# Patient Record
Sex: Female | Born: 1940 | Race: White | Hispanic: No | Smoking: Never smoker
Health system: Southern US, Community
[De-identification: ages and names within clinical notes are randomized; demographics above are authoritative.]

## PROBLEM LIST (undated history)

## (undated) DIAGNOSIS — K449 Diaphragmatic hernia without obstruction or gangrene: Secondary | ICD-10-CM

## (undated) DIAGNOSIS — K922 Gastrointestinal hemorrhage, unspecified: Secondary | ICD-10-CM

## (undated) DIAGNOSIS — I219 Acute myocardial infarction, unspecified: Secondary | ICD-10-CM

## (undated) DIAGNOSIS — R51 Headache: Secondary | ICD-10-CM

## (undated) DIAGNOSIS — I2510 Atherosclerotic heart disease of native coronary artery without angina pectoris: Secondary | ICD-10-CM

## (undated) DIAGNOSIS — J449 Chronic obstructive pulmonary disease, unspecified: Secondary | ICD-10-CM

## (undated) DIAGNOSIS — K219 Gastro-esophageal reflux disease without esophagitis: Secondary | ICD-10-CM

## (undated) DIAGNOSIS — J189 Pneumonia, unspecified organism: Secondary | ICD-10-CM

## (undated) DIAGNOSIS — E063 Autoimmune thyroiditis: Secondary | ICD-10-CM

## (undated) DIAGNOSIS — C50919 Malignant neoplasm of unspecified site of unspecified female breast: Secondary | ICD-10-CM

## (undated) DIAGNOSIS — M1990 Unspecified osteoarthritis, unspecified site: Secondary | ICD-10-CM

## (undated) DIAGNOSIS — Z9861 Coronary angioplasty status: Secondary | ICD-10-CM

## (undated) DIAGNOSIS — M797 Fibromyalgia: Secondary | ICD-10-CM

## (undated) DIAGNOSIS — E039 Hypothyroidism, unspecified: Secondary | ICD-10-CM

## (undated) DIAGNOSIS — R0600 Dyspnea, unspecified: Secondary | ICD-10-CM

## (undated) DIAGNOSIS — N6022 Fibroadenosis of left breast: Secondary | ICD-10-CM

## (undated) DIAGNOSIS — R519 Headache, unspecified: Secondary | ICD-10-CM

## (undated) HISTORY — DX: Unspecified osteoarthritis, unspecified site: M19.90

## (undated) HISTORY — DX: Coronary angioplasty status: Z98.61

## (undated) HISTORY — DX: Chronic obstructive pulmonary disease, unspecified: J44.9

## (undated) HISTORY — PX: ABDOMINAL HYSTERECTOMY: SHX81

## (undated) HISTORY — PX: COLONOSCOPY: SHX174

## (undated) HISTORY — PX: APPENDECTOMY: SHX54

## (undated) HISTORY — DX: Gastro-esophageal reflux disease without esophagitis: K21.9

## (undated) HISTORY — PX: BREAST BIOPSY: SHX20

## (undated) HISTORY — PX: BREAST CYST EXCISION: SHX579

## (undated) HISTORY — DX: Atherosclerotic heart disease of native coronary artery without angina pectoris: I25.10

## (undated) HISTORY — DX: Gastrointestinal hemorrhage, unspecified: K92.2

## (undated) HISTORY — DX: Diaphragmatic hernia without obstruction or gangrene: K44.9

## (undated) HISTORY — PX: BREAST EXCISIONAL BIOPSY: SUR124

## (undated) HISTORY — PX: NASAL SINUS SURGERY: SHX719

---

## 2001-01-07 ENCOUNTER — Encounter: Payer: Self-pay | Admitting: Family Medicine

## 2001-01-07 ENCOUNTER — Encounter: Admission: RE | Admit: 2001-01-07 | Discharge: 2001-01-07 | Payer: Self-pay | Admitting: Family Medicine

## 2001-01-09 ENCOUNTER — Encounter: Payer: Self-pay | Admitting: Family Medicine

## 2001-01-09 ENCOUNTER — Encounter: Admission: RE | Admit: 2001-01-09 | Discharge: 2001-01-09 | Payer: Self-pay | Admitting: Family Medicine

## 2002-03-09 ENCOUNTER — Encounter: Admission: RE | Admit: 2002-03-09 | Discharge: 2002-06-07 | Payer: Self-pay | Admitting: Internal Medicine

## 2002-09-29 ENCOUNTER — Ambulatory Visit (HOSPITAL_COMMUNITY): Admission: RE | Admit: 2002-09-29 | Discharge: 2002-09-29 | Payer: Self-pay | Admitting: Gastroenterology

## 2002-10-23 ENCOUNTER — Encounter: Payer: Self-pay | Admitting: Pulmonary Disease

## 2002-12-09 ENCOUNTER — Encounter: Payer: Self-pay | Admitting: Surgery

## 2002-12-09 ENCOUNTER — Ambulatory Visit (HOSPITAL_COMMUNITY): Admission: RE | Admit: 2002-12-09 | Discharge: 2002-12-09 | Payer: Self-pay | Admitting: Surgery

## 2003-04-01 ENCOUNTER — Encounter: Admission: RE | Admit: 2003-04-01 | Discharge: 2003-04-01 | Payer: Self-pay | Admitting: Internal Medicine

## 2003-07-20 ENCOUNTER — Ambulatory Visit (HOSPITAL_COMMUNITY): Admission: RE | Admit: 2003-07-20 | Discharge: 2003-07-20 | Payer: Self-pay | Admitting: Internal Medicine

## 2003-08-29 ENCOUNTER — Ambulatory Visit (HOSPITAL_COMMUNITY): Admission: RE | Admit: 2003-08-29 | Discharge: 2003-08-29 | Payer: Self-pay | Admitting: Internal Medicine

## 2003-09-08 ENCOUNTER — Encounter (INDEPENDENT_AMBULATORY_CARE_PROVIDER_SITE_OTHER): Payer: Self-pay | Admitting: Specialist

## 2003-09-08 HISTORY — PX: LYSIS OF ADHESION: SHX5961

## 2003-09-08 HISTORY — PX: LAPAROSCOPIC SALPINGO OOPHERECTOMY: SHX5927

## 2003-09-09 ENCOUNTER — Inpatient Hospital Stay (HOSPITAL_COMMUNITY): Admission: RE | Admit: 2003-09-09 | Discharge: 2003-09-10 | Payer: Self-pay | Admitting: Obstetrics and Gynecology

## 2003-11-29 ENCOUNTER — Ambulatory Visit (HOSPITAL_COMMUNITY): Admission: RE | Admit: 2003-11-29 | Discharge: 2003-11-29 | Payer: Self-pay | Admitting: Orthopaedic Surgery

## 2003-12-07 ENCOUNTER — Ambulatory Visit (HOSPITAL_BASED_OUTPATIENT_CLINIC_OR_DEPARTMENT_OTHER): Admission: RE | Admit: 2003-12-07 | Discharge: 2003-12-07 | Payer: Self-pay | Admitting: Orthopaedic Surgery

## 2003-12-07 HISTORY — PX: KNEE ARTHROSCOPY: SHX127

## 2004-02-01 ENCOUNTER — Ambulatory Visit: Payer: Self-pay | Admitting: Internal Medicine

## 2004-05-11 ENCOUNTER — Ambulatory Visit: Payer: Self-pay | Admitting: Pulmonary Disease

## 2004-10-20 ENCOUNTER — Ambulatory Visit (HOSPITAL_COMMUNITY): Admission: RE | Admit: 2004-10-20 | Discharge: 2004-10-20 | Payer: Self-pay | Admitting: Obstetrics and Gynecology

## 2004-10-24 ENCOUNTER — Ambulatory Visit (HOSPITAL_COMMUNITY): Admission: RE | Admit: 2004-10-24 | Discharge: 2004-10-24 | Payer: Self-pay | Admitting: Obstetrics and Gynecology

## 2005-01-18 ENCOUNTER — Ambulatory Visit: Payer: Self-pay | Admitting: Internal Medicine

## 2005-02-16 ENCOUNTER — Ambulatory Visit (HOSPITAL_COMMUNITY): Admission: RE | Admit: 2005-02-16 | Discharge: 2005-02-16 | Payer: Self-pay | Admitting: Obstetrics and Gynecology

## 2005-02-23 ENCOUNTER — Encounter: Admission: RE | Admit: 2005-02-23 | Discharge: 2005-02-23 | Payer: Self-pay | Admitting: Obstetrics and Gynecology

## 2005-03-08 ENCOUNTER — Ambulatory Visit: Payer: Self-pay | Admitting: Internal Medicine

## 2005-04-04 ENCOUNTER — Ambulatory Visit: Payer: Self-pay | Admitting: Internal Medicine

## 2005-12-05 ENCOUNTER — Ambulatory Visit (HOSPITAL_COMMUNITY): Admission: RE | Admit: 2005-12-05 | Discharge: 2005-12-05 | Payer: Self-pay | Admitting: Obstetrics and Gynecology

## 2006-01-01 ENCOUNTER — Ambulatory Visit: Payer: Self-pay | Admitting: Cardiology

## 2006-01-16 ENCOUNTER — Ambulatory Visit: Payer: Self-pay | Admitting: Cardiology

## 2006-01-29 ENCOUNTER — Ambulatory Visit: Payer: Self-pay | Admitting: Cardiology

## 2006-03-01 ENCOUNTER — Ambulatory Visit: Payer: Self-pay | Admitting: Internal Medicine

## 2006-04-30 ENCOUNTER — Ambulatory Visit: Payer: Self-pay | Admitting: Internal Medicine

## 2006-05-30 ENCOUNTER — Ambulatory Visit: Payer: Self-pay | Admitting: Internal Medicine

## 2006-06-03 ENCOUNTER — Ambulatory Visit: Payer: Self-pay | Admitting: Critical Care Medicine

## 2006-06-16 ENCOUNTER — Encounter: Admission: RE | Admit: 2006-06-16 | Discharge: 2006-06-16 | Payer: Self-pay | Admitting: Orthopedic Surgery

## 2006-08-01 ENCOUNTER — Ambulatory Visit: Payer: Self-pay | Admitting: Internal Medicine

## 2006-10-02 HISTORY — PX: RECTAL POLYPECTOMY: SHX2309

## 2006-10-07 ENCOUNTER — Ambulatory Visit: Payer: Self-pay | Admitting: Pulmonary Disease

## 2006-11-27 ENCOUNTER — Ambulatory Visit: Payer: Self-pay | Admitting: Critical Care Medicine

## 2006-12-05 ENCOUNTER — Encounter: Payer: Self-pay | Admitting: Internal Medicine

## 2006-12-05 DIAGNOSIS — J449 Chronic obstructive pulmonary disease, unspecified: Secondary | ICD-10-CM

## 2006-12-05 DIAGNOSIS — K219 Gastro-esophageal reflux disease without esophagitis: Secondary | ICD-10-CM

## 2007-01-24 ENCOUNTER — Encounter: Payer: Self-pay | Admitting: Internal Medicine

## 2007-04-18 ENCOUNTER — Encounter: Admission: RE | Admit: 2007-04-18 | Discharge: 2007-04-18 | Payer: Self-pay | Admitting: Internal Medicine

## 2007-07-17 ENCOUNTER — Encounter: Payer: Self-pay | Admitting: Internal Medicine

## 2007-08-29 ENCOUNTER — Telehealth (INDEPENDENT_AMBULATORY_CARE_PROVIDER_SITE_OTHER): Payer: Self-pay | Admitting: *Deleted

## 2007-09-03 ENCOUNTER — Encounter: Payer: Self-pay | Admitting: Internal Medicine

## 2008-01-27 ENCOUNTER — Encounter (INDEPENDENT_AMBULATORY_CARE_PROVIDER_SITE_OTHER): Payer: Self-pay | Admitting: *Deleted

## 2008-02-10 ENCOUNTER — Encounter: Payer: Self-pay | Admitting: Adult Health

## 2008-02-10 ENCOUNTER — Ambulatory Visit: Payer: Self-pay | Admitting: Critical Care Medicine

## 2008-02-23 ENCOUNTER — Telehealth: Payer: Self-pay | Admitting: Critical Care Medicine

## 2008-03-02 ENCOUNTER — Ambulatory Visit: Payer: Self-pay | Admitting: Critical Care Medicine

## 2008-03-31 ENCOUNTER — Ambulatory Visit: Payer: Self-pay | Admitting: Internal Medicine

## 2008-04-09 ENCOUNTER — Encounter: Payer: Self-pay | Admitting: Internal Medicine

## 2008-04-15 ENCOUNTER — Ambulatory Visit: Payer: Self-pay | Admitting: Critical Care Medicine

## 2008-04-18 ENCOUNTER — Encounter: Payer: Self-pay | Admitting: Critical Care Medicine

## 2008-07-05 ENCOUNTER — Encounter: Admission: RE | Admit: 2008-07-05 | Discharge: 2008-07-05 | Payer: Self-pay | Admitting: Obstetrics and Gynecology

## 2008-11-30 ENCOUNTER — Ambulatory Visit: Payer: Self-pay | Admitting: Internal Medicine

## 2009-03-29 ENCOUNTER — Ambulatory Visit: Payer: Self-pay | Admitting: Pulmonary Disease

## 2009-03-29 DIAGNOSIS — R05 Cough: Secondary | ICD-10-CM

## 2009-04-04 ENCOUNTER — Telehealth (INDEPENDENT_AMBULATORY_CARE_PROVIDER_SITE_OTHER): Payer: Self-pay | Admitting: *Deleted

## 2009-04-13 ENCOUNTER — Ambulatory Visit: Payer: Self-pay | Admitting: Pulmonary Disease

## 2009-05-24 ENCOUNTER — Encounter: Payer: Self-pay | Admitting: Cardiology

## 2009-05-24 ENCOUNTER — Telehealth: Payer: Self-pay | Admitting: Cardiology

## 2009-06-07 DIAGNOSIS — E785 Hyperlipidemia, unspecified: Secondary | ICD-10-CM | POA: Insufficient documentation

## 2009-06-07 DIAGNOSIS — R42 Dizziness and giddiness: Secondary | ICD-10-CM | POA: Insufficient documentation

## 2009-06-09 ENCOUNTER — Ambulatory Visit: Payer: Self-pay | Admitting: Cardiology

## 2009-06-09 ENCOUNTER — Telehealth: Payer: Self-pay | Admitting: Cardiology

## 2009-08-03 ENCOUNTER — Ambulatory Visit: Payer: Self-pay | Admitting: Pulmonary Disease

## 2009-09-01 ENCOUNTER — Telehealth (INDEPENDENT_AMBULATORY_CARE_PROVIDER_SITE_OTHER): Payer: Self-pay | Admitting: *Deleted

## 2009-09-27 ENCOUNTER — Encounter
Admission: RE | Admit: 2009-09-27 | Discharge: 2009-12-08 | Payer: Self-pay | Source: Home / Self Care | Admitting: Endocrinology

## 2010-01-04 ENCOUNTER — Ambulatory Visit: Payer: Self-pay | Admitting: Pulmonary Disease

## 2010-01-04 DIAGNOSIS — G4700 Insomnia, unspecified: Secondary | ICD-10-CM

## 2010-03-23 ENCOUNTER — Telehealth: Payer: Self-pay | Admitting: Pulmonary Disease

## 2010-04-01 ENCOUNTER — Encounter: Payer: Self-pay | Admitting: Obstetrics and Gynecology

## 2010-04-02 ENCOUNTER — Encounter: Payer: Self-pay | Admitting: Obstetrics and Gynecology

## 2010-04-02 ENCOUNTER — Encounter: Payer: Self-pay | Admitting: Cardiology

## 2010-04-02 ENCOUNTER — Encounter: Payer: Self-pay | Admitting: Internal Medicine

## 2010-04-11 NOTE — Assessment & Plan Note (Signed)
Summary: np6/ veritgo/ pt has medicare, gd   Visit Type:  Initial Consult Referring Provider:  Self Primary Provider:  Dr Posey Rea   History of Present Illness: Ms Herdt comes in today self referred, after not being seen in the office in 4 years, for the acute onset of vertigo and balance gait.  This occurred suddenly 3:15 2011. It was also associated with a headache. She called here first but couldn't get in touch with with me immediately. She then went to urgent care. They advised a head CT. She refused initially and called [place] back again. We advised her to go obtain the scan.  This was done by [facility] have reviewed the report today. There were no acute findings. 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  She been having smoldering sinusitis for several weeks prior this event. She did not seek help from her primary care or from the pulmonary allergy department here that she sees on a regular basis. Advised to do so in the future.  She had no chest pain, angina, palpitations, since the tachycardia, or syncope.  Current Medications (verified): 1)  Advair Diskus 250-50 Mcg/dose Misc (Fluticasone-Salmeterol) .Marland Kitchen.. 1 Puff Two Times A Day 2)  Singulair 10 Mg Tabs (Montelukast Sodium) .... Out 3)  Nasonex 50 Mcg/act Susp (Mometasone Furoate) .... Out 4)  Proair Hfa 108 (90 Base) Mcg/act  Aers (Albuterol Sulfate) .Marland Kitchen.. 1-2 Puffs Every 4-6 Hours As Needed 5)  Synthroid 150 Mcg Tabs (Levothyroxine Sodium) .Marland Kitchen.. 1 By Mouth Daily 6)  Sinus Rinse  Pack (Hypertonic Nasal Wash) .... Daily 7)  Debrox 6.5 % Soln (Carbamide Peroxide) .... Use As Directed For Ear Wax 8)  Albuterol Sulfate (2.5 Mg/42ml) 0.083% Nebu (Albuterol Sulfate) .... One Vial Nebulized As Needed  Allergies (verified): 1)  ! Codeine Sulfate (Codeine Sulfate) 2)  ! Adenosine (Adenosine) 3)  ! * Ivp Dye  Past History:  Past Medical History: Last updated: 06/07/2009 HYPERLIPIDEMIA (ICD-272.4) CORONARY ARTERY  DISEASE, FAMILY HX (ICD-V17.3) VERTIGO (ICD-780.4) CHRONIC RHINITIS (ICD-472.0) GERD (ICD-530.81) ASTHMA (ICD-493.90) -PFT 04/15/08 FEV1 1.63(76%), FVC 3.46(117%), FEV1% 47, TLC 5.68(113%), DLCO 87%, no BD  Past Surgical History: Last updated: 03/29/2009 Appendectomy Hysterectomy Total knee replacement right Sinus surgery     -1972    Cyst off left breast  Family History: Last updated: 06/07/2009 Family History Asthma mother as child and sister, cousins etc Family History of Coronary Artery Disease:   Social History: Last updated: 03/29/2009 Divorced.  Two children (one deceased).  Retired Char[employer] fundraiser.  Never smoked, but had second hand exsposure from husband.  Alcohol use-no Drug use-no Regular exercise-no  Risk Factors: Exercise: no (03/31/2008)  Risk Factors: Smoking Status: never (03/31/2008) Passive Smoke Exposure: yes (04/15/2008)  Review of Systems       negative other than history of present illness all systems reviewed  Vital Signs:  Patient profile:   70 year old female Height:      65 inches Weight:      167 pounds BMI:     27.89 Pulse rate:   85 / minute Resp:     16 per minute BP sitting:   128 / 76  (left arm)  Vitals Entered By: Marrion Coy, CNA (June 09, 2009 10:47 AM)  Physical Exam  General:  Well developed, well nourished, in no acute distress. Head:  normocephalic and atraumatic no significant tenderness of any sinuses. Eyes:  PERRLA/EOM intact; conjunctiva and lids normal. Ears:  both external canals occluded with wax, eardrums not visible  Neck:  Neck supple, no JVD. No masses, thyromegaly or abnormal cervical nodes. Chest Jaanvi Fizer:  no deformities or breast masses noted Lungs:  Clear bilaterally to auscultation and percussion. Heart:  Non-displaced PMI, chest non-tender; regular rate and rhythm, S1, S2 without murmurs, rubs or gallops. Carotid upstroke normal, no bruit. Normal abdominal aortic size, no bruits. Femorals normal pulses, no bruits.  Pedals normal pulses. No edema, no varicosities. Msk:  Back normal, normal gait. Muscle strength and tone normal. Pulses:  pulses normal in all 4 extremities Extremities:  No clubbing or cyanosis. Neurologic:  Alert and oriented x 3. Skin:  Intact without lesions or rashes. Psych:  Normal affect.   EKG  Procedure date:  06/09/2009  Findings:      normal sinus rhythm, nonspecific T wave changes, stable  Impression & Recommendations:  Problem # 1:  VERTIGO (ICD-780.4) Assessment New Her sudden onset of vertigo and imbalance as well as a headache had been evaluated appropriately. His CT at that time did not show any acute intracranial process. She does have chronic sinusitis and based on her exam today it  probably lead to fluid accumulation in her inner ear resulting in the vertigo. There no other signs or suggestions of posterior circulation insufficiency. I do not think this is Meneire's disease.  I advised her to use ear wax softener nightly for the next week or so. Once her ears had drained, and she should followup with pulmonary and allergy. I reviewed at length that the most appropriate thing to do with acute vertigo and headache he is to get urgent evaluation. Wasting precious time calling this office or any other office could increase her risk of adverse outcome. In addition her sinusitis could exacerbate this problem again in the future. Have advised to stay closely followed by pulmonary.  Signs and symptoms of posterior circulation TIAs or mini strokes were reviewed. How to obtain urgent care for this was also reviewed and reinforced.  I'll see her back on a p.r.n. basis.  Problem # 2:  CORONARY ARTERY DISEASE, FAMILY HX (ICD-V17.3) Assessment: Unchanged  Orders: EKG w/ Interpretation (93000)  Patient Instructions: 1)  Your physician recommends that you schedule a follow-up appointment in: as needed 2)  Your physician recommends that you continue on your current  medications as directed. Please refer to the Current Medication list given to you today. 3)  follow up with dr Craige Cotta or dr Delford Field  4)  ear softner nightly  for  a week

## 2010-04-11 NOTE — Progress Notes (Signed)
Summary: Albuterol Neb refill  Phone Note Call from Patient Call back at [facility] Phone [phone]   Caller: Patient Call For: sood Summary of Call: pt requests albuterol for neb (at least 1 box). 2.5mg  1 vial as needed. [facility] on [address]. (pt has some at home to use but realized it was several yrs old).  Initial call taken by: Tivis Ringer, CNA,  April 04, 2009 11:29 AM  Follow-up for Phone Call        1 box only contains 25 vials. She uses this as needed daily and I will send 100 vials so that she can use more than once daily if needed and she will not have another copay. She normally only uses the neb if she is sick. The pt is aware. Follow-up by: Michel Bickers CMA,  April 04, 2009 11:54 AM    Prescriptions: ALBUTEROL SULFATE (2.5 MG/3ML) 0.083% NEBU (ALBUTEROL SULFATE) one vial nebulized as needed  #100 x 5   Entered by:   Michel Bickers CMA   Authorized by:   [name] MD   Signed by:   Michel Bickers CMA on 04/04/2009   Method used:   Electronically to        [facility]  [phone]* (retail)       [address]       [place], [place]  [address]       Ph: [phone] or [phone]       Fax: [phone]   RxID:   [id number]

## 2010-04-11 NOTE — Progress Notes (Signed)
Summary: prescription  Phone Note Call from Patient   Caller: Patient Call For: sood Summary of Call: pt need refill for synthoid  symbicort 160/4.5 and proair 2 to 3 puffs as needed for 90 day supply fax to [facility] at [phone] Initial call taken by: Rickard Patience,  September 01, 2009 11:30 AM  Follow-up for Phone Call        [facility] Follow-up by: Philipp Deputy CMA,  September 01, 2009 3:36 PM    New/Updated Medications: SYMBICORT 160-4.5 MCG/ACT AERO (BUDESONIDE-FORMOTEROL FUMARATE) 2 puffs twice a day Prescriptions: SYMBICORT 160-4.5 MCG/ACT AERO (BUDESONIDE-FORMOTEROL FUMARATE) 2 puffs twice a day  #3 x 3   Entered by:   Philipp Deputy CMA   Authorized by:   [name] MD   Signed by:   Philipp Deputy CMA on 09/01/2009   Method used:   Faxed to ...       [facility] ([facility]order)       [address] [place], [place]  [address]       Ph: [phone]       Fax: [phone]   RxID:   [id number] SYNTHROID 150 MCG TABS (LEVOTHYROXINE SODIUM) 1 by mouth daily  #90 x 3   Entered by:   Philipp Deputy CMA   Authorized by:   [name] MD   Signed by:   Philipp Deputy CMA on 09/01/2009   Method used:   Faxed to ...       [facility] ([facility]order)       [address] [place], [place]  [address]       Ph: [phone]       Fax: [phone]   RxID:   [id number] PROAIR HFA 108 (90 BASE) MCG/ACT  AERS (ALBUTEROL SULFATE) 1-2 puffs every 4-6 hours as needed  #3 x 3   Entered by:   Philipp Deputy CMA   Authorized by:   [name] MD   Signed by:   Philipp Deputy CMA on 09/01/2009   Method used:   Faxed to ...       [facility] ([facility])       [address] [place], [place]  [address]       Ph: [phone]       Fax: [phone]   RxID:   [id number]

## 2010-04-11 NOTE — Assessment & Plan Note (Signed)
Summary: f/u ///kp   Visit Type:  Follow-up Copy to:  Bindu Ballan Primary Provider/Referring Provider:  Tresa Garter MD  CC:  Asthma...the patient says she is doing well on Symbicort...oral  thrush has cleared.  History of Present Illness: 70 yo female with Asthma, Rhinitis.  She is using symbicort in the morning, but not always at night.  She is working full time again, and has an erratic work schedule.  She has been using 3mg  melatonin at bedtime on most nights.  She is not having trouble with cough, wheeze, congestion, chest tightness, or sputum.  Her sinuses have been doing okay, and she is not having problems with her throat.   Current Medications (verified): 1)  Symbicort 160-4.5 Mcg/act Aero (Budesonide-Formoterol Fumarate) .... 2 Puffs Twice A Day 2)  Proair Hfa 108 (90 Base) Mcg/act  Aers (Albuterol Sulfate) .Marland Kitchen.. 1-2 Puffs Every 4-6 Hours As Needed 3)  Synthroid 150 Mcg Tabs (Levothyroxine Sodium) .Marland Kitchen.. 1 By Mouth Daily 4)  Sinus Rinse  Pack (Hypertonic Nasal Wash) .... Daily 5)  Albuterol Sulfate (2.5 Mg/15ml) 0.083% Nebu (Albuterol Sulfate) .... One Vial Nebulized As Needed  Allergies (verified): 1)  ! Codeine Sulfate (Codeine Sulfate) 2)  ! Adenosine (Adenosine) 3)  ! * Ivp Dye  Past History:  Past Medical History: HYPERLIPIDEMIA (ICD-272.4) CORONARY ARTERY DISEASE, FAMILY HX (ICD-V17.3) VERTIGO (ICD-780.4) CHRONIC RHINITIS (ICD-472.0) GERD (ICD-530.81) ASTHMA (ICD-493.90)      - PFT 04/15/08 FEV1 1.63(76%), FVC 3.46(117%), FEV1% 47, TLC 5.68(113%), DLCO 87%, no BD Melanoma Vitamin D deficiency Pre-Diabetes  Past Surgical History: Reviewed history from 03/29/2009 and no changes required. Appendectomy Hysterectomy Total knee replacement right Sinus surgery     -1972    Cyst off left breast  Family History: Reviewed history from 06/07/2009 and no changes required. Family History Asthma mother as child and sister, cousins etc Family History of  Coronary Artery Disease:   Social History: Divorced.  Two children (one deceased).  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  Never smoked, but had second hand exsposure from husband.  Vital Signs:  Patient profile:   70 year old female Height:      65 inches (165.10 cm) Weight:      157.50 pounds (71.59 kg) BMI:     26.30 O2 Sat:      97 % on Room air Temp:     97.7 degrees F (36.50 degrees C) oral Pulse rate:   84 / minute BP sitting:   110 / 56  (left arm) Cuff size:   regular  Vitals Entered By: Michel Bickers CMA (January 04, 2010 10:31 AM)  O2 Sat at Rest %:  97 O2 Flow:  Room air CC: Asthma...the patient says she is doing well on Symbicort...oral  thrush has cleared Comments Medications reviewed with patient Michel Bickers [facility]  January 04, 2010 10:38 AM   Physical Exam  General:  healthy appearing.   Nose:  clear nasal discharge.   Mouth:  no deformity or lesions Neck:  no JVD.   Lungs:  clear bilaterally to auscultation and percussion Heart:  regular rate and rhythm, S1, S2 without murmurs, rubs, gallops, or clicks Extremities:  no clubbing, cyanosis, edema, or deformity noted Neurologic:  normal CN II-XII and strength normal.   Cervical Nodes:  no significant adenopathy Psych:  alert and cooperative; normal mood and affect; normal attention span and concentration   Impression & Recommendations:  Problem # 1:  ASTHMA (ICD-493.90) This is improved.  She is to continue symbicort and as needed proair.  She will get her flu shot through work.  Problem # 2:  CHRONIC RHINITIS (ICD-472.0)  This is stable.  She is to use as needed nasonex, loratadine, and nasal rinse.  Gave her sample of nasonex.  Problem # 3:  INSOMNIA (ICD-780.52)  Mostly related to shift work.  Advised okay to continue with melatonin.  Medications Added to Medication List This Visit: 1)  Loratadine 10 Mg Tabs (Loratadine) .... One by mouth once daily as needed 2)  Nasonex 50  Mcg/act Susp (Mometasone furoate) .... Two sprays once daily as needed 3)  Melatonin 3 Mg Tabs (Melatonin) .... One by mouth at bedtime as needed sleep  Complete Medication List: 1)  Symbicort 160-4.5 Mcg/act Aero (Budesonide-formoterol fumarate) .... 2 puffs twice a day 2)  Proair Hfa 108 (90 Base) Mcg/act Aers (Albuterol sulfate) .Marland Kitchen.. 1-2 puffs every 4-6 hours as needed 3)  Synthroid 150 Mcg Tabs (Levothyroxine sodium) .Marland Kitchen.. 1 by mouth daily 4)  Sinus Rinse Pack (Hypertonic nasal wash) .... Daily 5)  Albuterol Sulfate (2.5 Mg/36ml) 0.083% Nebu (Albuterol sulfate) .... One vial nebulized as needed 6)  Loratadine 10 Mg Tabs (Loratadine) .... One by mouth once daily as needed 7)  Nasonex 50 Mcg/act Susp (Mometasone furoate) .... Two sprays once daily as needed 8)  Melatonin 3 Mg Tabs (Melatonin) .... One by mouth at bedtime as needed sleep  Other Orders: Est. Patient Level III (66440)  Patient Instructions: 1)  Loratadine 10 mg once daily as needed 2)  Nasonex two sprays once daily as needed 3)  Symbicort two puffs two times a day 4)  Proair two puffs as needed 5)  Follow up in 6 months

## 2010-04-11 NOTE — Progress Notes (Signed)
Summary: questions about visit today  Phone Note Call from Patient Call back at Home Phone [phone]   Caller: pt Reason for Call: Talk to Nurse Summary of Call: want to talk about visit today Initial call taken by: Faythe Ghee,  June 09, 2009 2:16 PM  Follow-up for Phone Call        BUSY X1 Scherrie Bateman, LPN  June 09, 2009 4:58 PM SPOKE WITH PT REQUESTING COPY OF HEAD CT AND OFFICE NOTE FROM 06/09/09 OFFICE VISIT MAILED TODAY. Follow-up by: Scherrie Bateman, LPN,  June 10, 2009 9:22 AM

## 2010-04-11 NOTE — Assessment & Plan Note (Signed)
Summary: 2 WEEKS/ MBW   Primary Provider/Referring Provider:  Dr Posey Rea  CC:  Pt here for follow up. Pt c/o clear PND.  History of Present Illness: Sonia [name] is a 70 -year-old white female with history of moderate severe persistent asthma.    She has been doing better.  She is down to advair 250.  She is using her nasal rinse once daily.  She still has some drainage, but this is clear.  She is not using her nebulizer or proair much.  She is using zyrtec two times a day, and continues with singulair.   Current Medications (verified): 1)  Advair Diskus 500-50 Mcg/dose Aepb (Fluticasone-Salmeterol) .Marland Kitchen.. 1 Puff Two Times A Day When Sick 2)  Advair Diskus 250-50 Mcg/dose Misc (Fluticasone-Salmeterol) .Marland Kitchen.. 1 Puff Two Times A Day 3)  Singulair 10 Mg Tabs (Montelukast Sodium) .... One By Mouth At Bedtime 4)  Nasonex 50 Mcg/act Susp (Mometasone Furoate) .... Two Sprays Once Daily 5)  Proair Hfa 108 (90 Base) Mcg/act  Aers (Albuterol Sulfate) .Marland Kitchen.. 1-2 Puffs Every 4-6 Hours As Needed 6)  Synthroid 150 Mcg Tabs (Levothyroxine Sodium) .Marland Kitchen.. 1 By Mouth Daily 7)  Sinus Rinse  [name] (Hypertonic Nasal Wash) .... Two Times A Day 8)  Debrox 6.5 % Soln (Carbamide Peroxide) .... Use As Directed For Ear Wax 9)  Albuterol Sulfate (2.5 Mg/32ml) 0.083% Nebu (Albuterol Sulfate) .... One Vial Nebulized As Needed  Allergies (verified): 1)  ! Codeine Sulfate (Codeine Sulfate) 2)  ! Adenosine (Adenosine) 3)  ! * Ivp Dye  Past History:  Past Medical History: Reviewed history from 03/29/2009 and no changes required. Asthma    -PFT 04/15/08 FEV1 1.63(76%), FVC 3.46(117%), FEV1% 47, TLC 5.68(113%), DLCO 87%, no BD GERD Cervical pain Endometriosis Hypothyroidism  Past Surgical History: Reviewed history from 03/29/2009 and no changes required. Appendectomy Hysterectomy Total knee replacement right Sinus surgery     -1972    Cyst off left breast  Vital Signs:  Patient profile:   70 year old  female Height:      65 inches Weight:      163 pounds O2 Sat:      96 % on Room air Temp:     98.4 degrees F oral Pulse rate:   92 / minute BP sitting:   112 / 82  (right arm) Cuff size:   regular  Vitals Entered By: Sonia [name] CMA (April 13, 2009 2:37 PM)  O2 Flow:  Room air CC: Pt here for follow up. Pt c/o clear PND Comments Medications reviewed with patient Verified pt's contact number Sonia [name] CMA  April 13, 2009 2:38 PM    Physical Exam  General:  healthy appearing.   Ears:  cerumen build up in right ear Nose:  clear nasal discharge.   Mouth:  no deformity or lesions Neck:  no JVD.   Lungs:  clear bilaterally to auscultation and percussion Heart:  regular rate and rhythm, S1, S2 without murmurs, rubs, gallops, or clicks Abdomen:  bowel sounds positive; abdomen soft and non-tender without masses, or organomegaly Extremities:  no clubbing, cyanosis, edema, or deformity noted Cervical Nodes:  no significant adenopathy   Impression & Recommendations:  Problem # 1:  ASTHMA (ICD-493.90) She has improved.  Will continue her current regimen.  Depending on her status at next visit will determine if she needs to continue singulair.  Will also determine if she may need further allergy testing.  Problem # 2:  CHRONIC RHINITIS (ICD-472.0)  She has improved.  Will continue her current regimen.  Complete Medication List: 1)  Advair Diskus 250-50 Mcg/dose Misc (Fluticasone-salmeterol) .Marland Kitchen.. 1 puff two times a day 2)  Singulair 10 Mg Tabs (Montelukast sodium) .... One by mouth at bedtime 3)  Nasonex 50 Mcg/act Susp (Mometasone furoate) .... Two sprays once daily 4)  Proair Hfa 108 (90 Base) Mcg/act Aers (Albuterol sulfate) .Marland Kitchen.. 1-2 puffs every 4-6 hours as needed 5)  Synthroid 150 Mcg Tabs (Levothyroxine sodium) .Marland Kitchen.. 1 by mouth daily 6)  Sinus Rinse [name] (Hypertonic nasal wash) .... Two times a day 7)  Debrox 6.5 % Soln (Carbamide peroxide) .... Use as directed  for ear wax 8)  Albuterol Sulfate (2.5 Mg/36ml) 0.083% Nebu (Albuterol sulfate) .... One vial nebulized as needed  Other Orders: Est. Patient Level III (38182)  Patient Instructions: 1)  Advair 250/50 one puff two times a day 2)  Singulair 10 mg at bedtime 3)  Proair two puffs up to four times per day as needed 4)  Albuterol nebulized up to four times per day as needed 5)  Nasal irrigation once daily 6)  Nasonex two sprays once daily 7)  Zyrtec as needed  8)  Follow up in 2 to 3 months Prescriptions: PROAIR HFA 108 (90 BASE) MCG/ACT  AERS (ALBUTEROL SULFATE) 1-2 puffs every 4-6 hours as needed  #3 x 3   Entered and Authorized by:   [name] MD   Signed by:   [name] MD on 04/13/2009   Method used:   Electronically to        [facility]  [phone]* (retail)       [address]       [place], [place]  [address]       Ph: [phone] or [phone]       Fax: [phone]   RxID:   [id number] SINGULAIR 10 MG TABS (MONTELUKAST SODIUM) one by mouth at bedtime  #90 x 3   Entered and Authorized by:   [name] MD   Signed by:   [name] MD on 04/13/2009   Method used:   Electronically to        [facility]  [phone]* (retail)       [address]       [place], [place]  [address]       Ph: [phone] or [phone]       Fax: [phone]   RxID:   [id number]    Immunization History:  Influenza Immunization History:    Influenza:  historical (01/10/2009)   Appended Document: 2 WEEKS/ MBW    Prescriptions: ADVAIR DISKUS 250-50 MCG/DOSE MISC (FLUTICASONE-SALMETEROL) 1 puff two times a day  #3 x 2   Entered by:   Sonia [name] CMA   Authorized by:   [name] MD   Signed by:   Sonia [name] CMA on 04/13/2009   Method used:   Faxed to ...       [facility]* (mail-order)       [address]       [place], [place]  [address]       Ph: [phone]        Fax: [phone]   RxID:   [id number] NASONEX 50 MCG/ACT SUSP (MOMETASONE FUROATE) two sprays once daily  #3 x 2   Entered by:   Sonia [name] CMA  Authorized by:   [name] MD   Signed by:   Sonia [name] CMA on 04/13/2009   Method used:   Faxed to ...       [facility]* (mail-order)       [address]       [place], [place]  [address]       Ph: [phone]       Fax: [phone]   RxID:   [id number] SINGULAIR 10 MG TABS (MONTELUKAST SODIUM) one by mouth at bedtime  #90 x 2   Entered by:   Sonia [name] CMA   Authorized by:   [name] MD   Signed by:   Sonia [name] CMA on 04/13/2009   Method used:   Faxed to ...       [facility]* (mail-order)       [address]       [place], [place]  [address]       Ph: [phone]       Fax: [phone]   RxID:   [id number]   Pt's request before leaving today.Sonia [name] CMA  April 13, 2009 4:46 PM

## 2010-04-11 NOTE — Progress Notes (Signed)
Summary: PT HAVING VERITGO  Phone Note Call from Patient Call back at Home Phone [phone]   Caller: Patient Summary of Call: PT HAVING VERITGO  PT ALSO WANT TO BE SEEN TODAY. 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 last office visit was in 07. pt express concerns that she wanted to be seen today.   advise pt that christine / dr. Arieon Scalzo is in office today seeing. pt aware  . [phone]  Lorne Skeens  May 24, 2009 1:37 PM   LMTCB Scherrie Bateman, LPN  May 24, 2009 2:09 PM  Additional Follow-up for Phone Call Additional follow up Details #1::        PT RETURN CALL PLEASE CALL PT AT [phone] [facility]  May 24, 2009 3:17 PM CALLED  PT  C/O SEVERE H/A NAUSEA AND VERTIGO  TODAY  NO C/O LIGHT SENSITIVITY B/P RAN 150 /90 PER PT USUALLY RUNS 110/60. PT DID GO TO URGENT CARE AND WAS SEEN BY PA. PA ORDERED HEAD CT TODAY TO R/O BLEED. PER PT WILL CALL OFFICE AND GET RESULTS . CONT TO C/O SLIGHT DIZZINESS WITH WALKING AT THIS TIME NOT AS SEVERE AS EARLIER TODAY. REQUESTING APPT ASAP . Additional Follow-up by: Scherrie Bateman, LPN,  May 24, 2009 5:30 PM    Additional Follow-up for Phone Call Additional follow up Details #2::    PER DR Lavere Shinsky F/U WITH PMD FOR VERTIGO. Follow-up by: Scherrie Bateman, LPN,  May 24, 2009 5:45 PM  Additional Follow-up for Phone Call Additional follow up Details #3:: Details for Additional Follow-up Action Taken: SPOKE WITH PT AWARE TO F/U PMD PER PT WANTING OFF TO GET COPY OF HEAD CT  FOR REVIEW Scherrie Bateman, LPN  May 27, 2009 8:52 AM PT AWARE HAVE CT RESULTS WILL LET DR Najeeb Uptain REVIEW Scherrie Bateman, LPN  May 27, 2009 9:58 AM   OK. Schedule in office ASAP with me or new pt with next available. Additional Follow-up by: Gaylord Shih, MD, [facility],  May 30, 2009 10:53 AM   Appended Document: PT HAVING VERITGO PT COMING IN ON MAR 31 AT  10:30./CY

## 2010-04-11 NOTE — Assessment & Plan Note (Signed)
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  She is doing this to get insurance.  She has been getting a stuffy nose, and some nose dryness.  She is not having nose bleeds.  She is not having cough or wheeze.  She ran out of singulair and nasonex in February, and was not able to get these refilled because of her finances.  She had problems with thrush from advair.   Preventive Screening-Counseling & Management  Alcohol-Tobacco     Smoking Status: never  Current Medications (verified): 1)  Advair Diskus 250-50 Mcg/dose Misc (Fluticasone-Salmeterol) .Sonia [name].. 1 Puff Two Times A Day 2)  Singulair 10 Mg Tabs (Montelukast Sodium) .... Take 1 By Mouth Once Daily 3)  Nasonex 50 Mcg/act Susp (Mometasone Furoate) .Sonia [name].. 1-2 Sprays Each Nostril Once Daily 4)  Proair Hfa 108 (90 Base) Mcg/act  Aers (Albuterol Sulfate) .Sonia [name].. 1-2 Puffs Every 4-6 Hours As Needed 5)  Synthroid 150 Mcg Tabs (Levothyroxine Sodium) .Sonia [name].. 1 By Mouth Daily 6)  Sinus Rinse  Pack (Hypertonic Nasal Wash) .... Daily 7)  Albuterol Sulfate (2.5 Mg/74ml) 0.083% Nebu (Albuterol Sulfate) .... One Vial Nebulized As Needed  Allergies (verified): 1)  ! Codeine Sulfate (Codeine Sulfate) 2)  ! Adenosine (Adenosine) 3)  ! * Ivp Dye  Past History:  Past Medical History: HYPERLIPIDEMIA (ICD-272.4) CORONARY ARTERY DISEASE, FAMILY HX (ICD-V17.3) VERTIGO (ICD-780.4) CHRONIC RHINITIS (ICD-472.0) GERD (ICD-530.81) ASTHMA (ICD-493.90)      - PFT 04/15/08 FEV1 1.63(76%), FVC 3.46(117%), FEV1% 47, TLC 5.68(113%), DLCO 87%, no BD Melanoma  Past Surgical History: Reviewed history from 03/29/2009 and no changes required. Appendectomy Hysterectomy Total  knee replacement right Sinus surgery     -1972    Cyst off left breast  Vital Signs:  Patient profile:   70 year old female Height:      65 inches Weight:      164 pounds BMI:     27.39 O2 Sat:      97 % on Room air Temp:     97.8 degrees F oral Pulse rate:   88 / minute BP sitting:   112 / 76  (left arm) Cuff size:   regular  Vitals Entered By: Reynaldo Minium CMA (Aug 03, 2009 2:28 PM)  O2 Flow:  Room air CC: follow up visit-asthma; no complaints; denies SOB or wheezing  as well as any asthma attacks. Comments Medications reviewed with patient Reynaldo Minium CMA  Aug 03, 2009 2:28 PM    Physical Exam  General:  healthy appearing.   Nose:  clear nasal discharge.   Mouth:  no deformity or lesions Neck:  no JVD.   Lungs:  clear bilaterally to auscultation and percussion Heart:  regular rate and rhythm, S1, S2 without murmurs, rubs, gallops, or clicks Extremities:  no clubbing, cyanosis, edema, or deformity noted Cervical Nodes:  no significant adenopathy   Impression & Recommendations:  Problem # 1:  ASTHMA (ICD-493.90) Her asthma has been stable.  She had problems with thrush from advair.  Have given her a sample of symbicort to see if the smaller particle size alleviates her thrush.  She will call to let me know if she wants to continue symbicort or  switch back to advair.  Problem # 2:  CHRONIC RHINITIS (ICD-472.0)  Will continue her current regimen.  Problem # 3:  GERD (ICD-530.81)  She is using prilosec as needed   Medications Added to Medication List This Visit: 1)  Singulair 10 Mg Tabs (Montelukast sodium) .... Take 1 by mouth once daily 2)  Nasonex 50 Mcg/act Susp (Mometasone furoate) .Sonia [name].. 1-2 sprays each nostril once daily  Complete Medication List: 1)  Advair Diskus 250-50 Mcg/dose Misc (Fluticasone-salmeterol) .Sonia [name].. 1 puff two times a day 2)  Singulair 10 Mg Tabs (Montelukast sodium) .... Take 1 by mouth once daily 3)  Nasonex 50 Mcg/act Susp (Mometasone  furoate) .Sonia [name].. 1-2 sprays each nostril once daily 4)  Proair Hfa 108 (90 Base) Mcg/act Aers (Albuterol sulfate) .Sonia [name].. 1-2 puffs every 4-6 hours as needed 5)  Synthroid 150 Mcg Tabs (Levothyroxine sodium) .Sonia [name].. 1 by mouth daily 6)  Sinus Rinse Pack (Hypertonic nasal wash) .... Daily 7)  Albuterol Sulfate (2.5 Mg/24ml) 0.083% Nebu (Albuterol sulfate) .... One vial nebulized as needed  Other Orders: Est. Patient Level III (81191)  Patient Instructions: 1)  Try symbicort two puff up two times a day, and rinse mouth after using 2)  Stop advair while using symbicort 3)  Follow up in 4 months

## 2010-04-11 NOTE — Assessment & Plan Note (Signed)
Summary: COUGH/ CHEST CONGESTION-PW'S PT   Primary Provider/Referring Provider:  Dr Posey Rea  CC:  Acute sick visit. Asthma patient of Dr. Lynelle Doctor.  The patient c/o some sob and cough with clear thick sticky mucus..  History of Present Illness: Ms. Sonia [name] is a 70 -year-old white female with history of moderate severe persistent asthma.    She developed a sinus infection around New Year's Eve.  She was also getting chills.  She was started on a Zpak January 2.  She felt this helped initially.  She was then started on amoxicillin by her gynecologist.  She is on day 5 of 10 for this.  She feels this has helped.  She has been using her nasal rinse.  She has a cough with white sputum.  She still gets a tickle in her throat, and has been wheezing more.  She has been using the 500 strength of advair since the beginning of the month.  She has been using her proair more.  She denies exposure to tobacco smoke.  Current Medications (verified): 1)  Advair Diskus 500-50 Mcg/dose Aepb (Fluticasone-Salmeterol) .Marland Kitchen.. 1 Puff Two Times A Day When Sick 2)  Advair Diskus 250-50 Mcg/dose Misc (Fluticasone-Salmeterol) .Marland Kitchen.. 1 Puff Two Times A Day 3)  Proair Hfa 108 (90 Base) Mcg/act  Aers (Albuterol Sulfate) .Marland Kitchen.. 1-2 Puffs Every 4-6 Hours As Needed 4)  Synthroid 150 Mcg Tabs (Levothyroxine Sodium) .Marland Kitchen.. 1 By Mouth Daily 5)  Sinus Rinse  Pack (Hypertonic Nasal Wash) .... Two Times A Day  Allergies (verified): 1)  ! Codeine Sulfate (Codeine Sulfate) 2)  ! Adenosine (Adenosine) 3)  ! * Ivp Dye  Past History:  Past Medical History: Asthma    -PFT 04/15/08 FEV1 1.63(76%), FVC 3.46(117%), FEV1% 47, TLC 5.68(113%), DLCO 87%, no BD GERD Cervical pain Endometriosis Hypothyroidism  Past Surgical History: Appendectomy Hysterectomy Total knee replacement right Sinus surgery     -1972    Cyst off left breast  Social History: Divorced.  Two children (one deceased).  Retired Charity fundraiser.  Never smoked, but had second  hand exsposure from husband.  Alcohol use-no Drug use-no Regular exercise-no  Vital Signs:  Patient profile:   70 year old female Height:      65 inches (165.10 cm) Weight:      162.25 pounds (73.75 kg) BMI:     27.10 O2 Sat:      97 % on Room air Temp:     97.7 degrees F (36.50 degrees C) oral Pulse rate:   103 / minute BP sitting:   118 / 62  (left arm) Cuff size:   regular  Vitals Entered By: Michel Bickers CMA (March 29, 2009 3:03 PM)  O2 Sat at Rest %:  97 O2 Flow:  Room air  Physical Exam  General:  healthy appearing.   Ears:  cerumen build up in right ear Nose:  clear nasal discharge.   Mouth:  throat injected.   Neck:  no JVD.   Lungs:  bilateral expiratory wheeze Heart:  regular rate and rhythm, S1, S2 without murmurs, rubs, gallops, or clicks Abdomen:  bowel sounds positive; abdomen soft and non-tender without masses, or organomegaly Extremities:  no clubbing, cyanosis, edema, or deformity noted Cervical Nodes:  no significant adenopathy   Impression & Recommendations:  Problem # 1:  ASTHMA, WITH ACUTE EXACERBATION (ICD-493.92) She received a depomedrol injection today.  I have advised her to finish her course of amoxicillin.  I will give her a two week sample of  singulair, and then decide if she needs to be on this long term.  She is to continue advair 500 strength for now.  I will give her a peak flow meter.    Depending on her response she may need to be tried on spiriva.  She may also need further assessment of her IgE and RAST.  Will need to discuss an asthma plan further at her next follow up.  Problem # 2:  CHRONIC RHINITIS (ICD-472.0) She is to continue with nasal irrigation and nasonex.  Hopefully, the use of singulair may provide benefit for this also.  Problem # 3:  CERUMEN IMPACTION (ICD-380.4) Advised her to try debrox.  If this is unsuccessful she may need to have her ear canal irrigated.  Medications Added to Medication List This Visit: 1)   Advair Diskus 500-50 Mcg/dose Aepb (Fluticasone-salmeterol) .Marland Kitchen.. 1 puff two times a day when sick 2)  Singulair 10 Mg Tabs (Montelukast sodium) .... One by mouth at bedtime 3)  Nasonex 50 Mcg/act Susp (Mometasone furoate) .... Two sprays once daily 4)  Synthroid 150 Mcg Tabs (Levothyroxine sodium) .Marland Kitchen.. 1 by mouth daily 5)  Sinus Rinse Pack (Hypertonic nasal wash) .... Two times a day 6)  Debrox 6.5 % Soln (Carbamide peroxide) .... Use as directed for ear wax 7)  Benadryl 25 Mg Caps (Diphenhydramine hcl) .... One by mouth at bedtime as needed 8)  Albuterol Sulfate (2.5 Mg/45ml) 0.083% Nebu (Albuterol sulfate) .... One vial nebulized as needed  Complete Medication List: 1)  Advair Diskus 500-50 Mcg/dose Aepb (Fluticasone-salmeterol) .Marland Kitchen.. 1 puff two times a day when sick 2)  Advair Diskus 250-50 Mcg/dose Misc (Fluticasone-salmeterol) .Marland Kitchen.. 1 puff two times a day 3)  Singulair 10 Mg Tabs (Montelukast sodium) .... One by mouth at bedtime 4)  Nasonex 50 Mcg/act Susp (Mometasone furoate) .... Two sprays once daily 5)  Proair Hfa 108 (90 Base) Mcg/act Aers (Albuterol sulfate) .Marland Kitchen.. 1-2 puffs every 4-6 hours as needed 6)  Synthroid 150 Mcg Tabs (Levothyroxine sodium) .Marland Kitchen.. 1 by mouth daily 7)  Sinus Rinse Pack (Hypertonic nasal wash) .... Two times a day 8)  Debrox 6.5 % Soln (Carbamide peroxide) .... Use as directed for ear wax 9)  Benadryl 25 Mg Caps (Diphenhydramine hcl) .... One by mouth at bedtime as needed 10)  Albuterol Sulfate (2.5 Mg/16ml) 0.083% Nebu (Albuterol sulfate) .... One vial nebulized as needed  Other Orders: Est. Patient Level IV (13086) Admin of Therapeutic Inj  intramuscular or subcutaneous (57846) Depo- Medrol 80mg  (J1040)  Patient Instructions: 1)  Depomedrol injection today 2)  Singulair 10 mg at bedtime for two weeks 3)  Nasonex two sprays once daily until sample is done 4)  Debrox for ear wax 5)  Follow up in 2 to 3 weeks   Medication Administration  Injection # 1:     Medication: Depo- Medrol 80mg     Diagnosis: ASTHMA, WITH ACUTE EXACERBATION (ICD-493.92)    Route: IM    Site: RUOQ gluteus    Exp Date: 12/2009    Lot #: [id number]    Mfr: TEVA    Patient tolerated injection without complications    Given by: Michel Bickers CMA (March 29, 2009 3:42 PM)  Orders Added: 1)  Est. Patient Level IV [13244] 2)  Admin of Therapeutic Inj  intramuscular or subcutaneous [96372] 3)  Depo- Medrol 80mg  [J1040]

## 2010-04-13 NOTE — Progress Notes (Signed)
Summary: sinus infection  Phone Note Call from Patient Call back at Home Phone [phone]   Caller: Patient Call For: DR Tarus Briski Summary of Call: Patient phoned she would like a call back she has a very bad sinus infection and thinks it has gone into her chest. She is running a fever. She would like a call back. She uses [facility] on [address]. She can be reached at [phone] Initial call taken by: Vedia Coffer,  March 23, 2010 10:32 AM  Follow-up for Phone Call        Spoke with pt and she is c/o having a fever of 99.6, frontal headache, facial pain as well. She denis any chest congestion, sob, wheezing, or cough. She states she is trying to catch this before it gets into her lungs. I advised the pt that she should contact her PCP for this since it is not astham related. She states Dr. Craige Cotta told her to contact us if she got sick and she was not going to contact PCP for this because they could never see her.  I then offered her an appt today with Dr. Shelle Iron, she stated she cannot come today and wants to see Dr. Craige Cotta only. I advised Dr. Craige Cotta does nto have any available appts this week, but that I could get her in to see one of his colleagues. SHe again refuses appt. She becomes very rude and states that I need to ask Dr. Craige Cotta about this and if she does not hear form me she will assume she needs to make other arrangements for an MD. I spoke to Dr. Craige Cotta and advised him of pt symptoms. He states that the pt should not wait too long so she does not get worse and should see someone today here if available or follow-up with her PCP because they can treat her as well. I again offered pt an appt but she refused and then proceeded to hang up on me. Carron Curie CMA  March 23, 2010 12:15 PM

## 2010-04-14 ENCOUNTER — Ambulatory Visit (INDEPENDENT_AMBULATORY_CARE_PROVIDER_SITE_OTHER): Payer: 59 | Admitting: Pulmonary Disease

## 2010-04-14 ENCOUNTER — Encounter: Payer: Self-pay | Admitting: Pulmonary Disease

## 2010-04-14 ENCOUNTER — Telehealth: Payer: Self-pay | Admitting: Pulmonary Disease

## 2010-04-14 DIAGNOSIS — J45909 Unspecified asthma, uncomplicated: Secondary | ICD-10-CM

## 2010-04-14 DIAGNOSIS — J310 Chronic rhinitis: Secondary | ICD-10-CM

## 2010-04-19 NOTE — Assessment & Plan Note (Addendum)
Summary: sick,cough   Vital Signs:  Patient profile:   70 year old female Height:      65 inches Weight:      157 pounds BMI:     26.22 O2 Sat:      99 % on Room air Temp:     97.8 degrees F oral Pulse rate:   92 / minute BP sitting:   120 / 60  (left arm) Cuff size:   regular  Vitals Entered By: Carver Fila (April 14, 2010 1:46 PM)  O2 Flow:  Room air CC: Acute visit. Pt c/o cough w/ yellow phlem, chest tightness, increased sob w/ exertion, chills, low grade fever x 2 weeks.  Comments meds and allergies updated Phone number updated Carver Fila  April 14, 2010 1:47 PM    Copy to:  Mauri Brooklyn Primary Provider/Referring Provider:  Tresa Garter MD  CC:  Acute visit. Pt c/o cough w/ yellow phlem, chest tightness, increased sob w/ exertion, chills, and low grade fever x 2 weeks. Marland Kitchen  History of Present Illness: 70 yo female with Asthma, Rhinitis.  She developed sinus congestion over the past few weeks.  She has a temp up to 99.6.  She has also been getting chills.  She saw her GYN early this week, and was told she had a bulging rt ear drum.  She was started on augmentin for a sinus infection.  Her fever and chills have improved.  She still has a lot of sinus drainage which is now more yellow.  She has been getting some chest tightness and more short of breath.  She has needed to use her proair more.  Her peak flow today was 170.  She has been feeling more tired.   Current Medications (verified): 1)  Symbicort 160-4.5 Mcg/act Aero (Budesonide-Formoterol Fumarate) .Marland Kitchen.. 1 Puff Two Times A Day 2)  Proair Hfa 108 (90 Base) Mcg/act  Aers (Albuterol Sulfate) .Marland Kitchen.. 1-2 Puffs Every 4-6 Hours As Needed 3)  Synthroid 150 Mcg Tabs (Levothyroxine Sodium) .Marland Kitchen.. 1 By Mouth Daily 4)  Sinus Rinse  Pack (Hypertonic Nasal Wash) .... Daily As Needed 5)  Albuterol Sulfate (2.5 Mg/27ml) 0.083% Nebu (Albuterol Sulfate) .... One Vial Nebulized As Needed 6)  Loratadine 10 Mg Tabs (Loratadine)  .... One By Mouth Once Daily As Needed 7)  Nasonex 50 Mcg/act Susp (Mometasone Furoate) .... Two Sprays Once Daily As Needed 8)  Melatonin 3 Mg Tabs (Melatonin) .... One By Mouth At Bedtime As Needed Sleep 9)  Augmentin 875-125 Mg Tabs (Amoxicillin-Pot Clavulanate) .Marland Kitchen.. 1 Two Times A Day By Dr. Perlie Gold  Allergies (verified): 1)  ! Codeine Sulfate (Codeine Sulfate) 2)  ! Adenosine (Adenosine) 3)  ! * Ivp Dye  Past History:  Past Medical History: Reviewed history from 01/04/2010 and no changes required. HYPERLIPIDEMIA (ICD-272.4) CORONARY ARTERY DISEASE, FAMILY HX (ICD-V17.3) VERTIGO (ICD-780.4) CHRONIC RHINITIS (ICD-472.0) GERD (ICD-530.81) ASTHMA (ICD-493.90)      - PFT 04/15/08 FEV1 1.63(76%), FVC 3.46(117%), FEV1% 47, TLC 5.68(113%), DLCO 87%, no BD Melanoma Vitamin D deficiency Pre-Diabetes  Past Surgical History: Reviewed history from 03/29/2009 and no changes required. Appendectomy Hysterectomy Total knee replacement right Sinus surgery     -1972    Cyst off left breast  Physical Exam  General:  healthy appearing.   Ears:  mild cerumen build up, no exudate, TM normal appearance Nose:  clear nasal discharge.   Mouth:  mild erythema, no exudate Neck:  no JVD.   Lungs:  faint  expiratory wheeze Heart:  regular rate and rhythm, S1, S2 without murmurs, rubs, gallops, or clicks Extremities:  no clubbing, cyanosis, edema, or deformity noted Neurologic:  normal CN II-XII and strength normal.   Cervical Nodes:  no significant adenopathy Psych:  alert and cooperative; normal mood and affect; normal attention span and concentration   Impression & Recommendations:  Problem # 1:  ASTHMA (ICD-493.90) She has acute exacerbation related to sinusitus.  Will give her course of medrol pak.  She is to continue her other inhaler regimen.  She is to call if she does not improve.  Problem # 2:  CHRONIC RHINITIS (ICD-472.0) She has acute sinusitus and has been started on  augmentin.  I have advised her to finish this.  She is to continue her other sinus regimen.  Medications Added to Medication List This Visit: 1)  Symbicort 160-4.5 Mcg/act Aero (Budesonide-formoterol fumarate) .Marland Kitchen.. 1 puff two times a day 2)  Sinus Rinse Pack (Hypertonic nasal wash) .... Daily as needed 3)  Augmentin 875-125 Mg Tabs (Amoxicillin-pot clavulanate) .Marland Kitchen.. 1 two times a day by dr. Perlie Gold 4)  Medrol (pak) 4 Mg Tabs (Methylprednisolone) .... Use as directed  Complete Medication List: 1)  Symbicort 160-4.5 Mcg/act Aero (Budesonide-formoterol fumarate) .Marland Kitchen.. 1 puff two times a day 2)  Proair Hfa 108 (90 Base) Mcg/act Aers (Albuterol sulfate) .Marland Kitchen.. 1-2 puffs every 4-6 hours as needed 3)  Synthroid 150 Mcg Tabs (Levothyroxine sodium) .Marland Kitchen.. 1 by mouth daily 4)  Sinus Rinse Pack (Hypertonic nasal wash) .... Daily as needed 5)  Albuterol Sulfate (2.5 Mg/53ml) 0.083% Nebu (Albuterol sulfate) .... One vial nebulized as needed 6)  Loratadine 10 Mg Tabs (Loratadine) .... One by mouth once daily as needed 7)  Nasonex 50 Mcg/act Susp (Mometasone furoate) .... Two sprays once daily as needed 8)  Melatonin 3 Mg Tabs (Melatonin) .... One by mouth at bedtime as needed sleep 9)  Augmentin 875-125 Mg Tabs (Amoxicillin-pot clavulanate) .Marland Kitchen.. 1 two times a day by dr. Perlie Gold 10)  Medrol (pak) 4 Mg Tabs (Methylprednisolone) .... Use as directed  Other Orders: Est. Patient Level IV (04540)  Patient Instructions: 1)  Medrol pak, use as directed 2)  Follow up in 4 months Prescriptions: MEDROL (PAK) 4 MG TABS (METHYLPREDNISOLONE) use as directed  #1 x 0   Entered and Authorized by:   [name] MD   Signed by:   [name] MD on 04/14/2010   Method used:   Electronically to        [facility]  [id number]* (retail)       [address]       [place], [place]  [address]       Ph: [phone] or [phone]       Fax: [phone]   RxID:    [id number]    Orders Added: 1)  Est. Patient Level IV [59563]

## 2010-04-19 NOTE — Progress Notes (Signed)
Summary: requests to be seen today/ sick   Phone Note Call from Patient Call back at Home Phone [phone]   Caller: Patient Call For: [name] Summary of Call: pt wants to be seen today (no avail anywhere) pls. advise. pt c/o tight chest / congestion x 3 days. she is currently on augmentin (from another dr). pt states that she is at 150 peak flow.  pt wants to be seen. [facility] [address] Initial call taken by: Tivis Ringer, CNA,  April 14, 2010 10:54 AM  Follow-up for Phone Call        Pt scheduled to see VS today at 1:45pm. Zackery Barefoot CMA  April 14, 2010 11:35 AM

## 2010-04-20 ENCOUNTER — Ambulatory Visit: Payer: 59 | Attending: Obstetrics and Gynecology | Admitting: Physical Therapy

## 2010-04-20 DIAGNOSIS — IMO0001 Reserved for inherently not codable concepts without codable children: Secondary | ICD-10-CM | POA: Insufficient documentation

## 2010-04-20 DIAGNOSIS — M2569 Stiffness of other specified joint, not elsewhere classified: Secondary | ICD-10-CM | POA: Insufficient documentation

## 2010-04-20 DIAGNOSIS — M629 Disorder of muscle, unspecified: Secondary | ICD-10-CM | POA: Insufficient documentation

## 2010-04-20 DIAGNOSIS — M2420 Disorder of ligament, unspecified site: Secondary | ICD-10-CM | POA: Insufficient documentation

## 2010-04-20 DIAGNOSIS — M60009 Infective myositis, unspecified site: Secondary | ICD-10-CM | POA: Insufficient documentation

## 2010-04-25 ENCOUNTER — Ambulatory Visit: Payer: 59 | Admitting: Physical Therapy

## 2010-05-02 ENCOUNTER — Ambulatory Visit: Payer: 59 | Admitting: Physical Therapy

## 2010-05-09 ENCOUNTER — Other Ambulatory Visit: Payer: Self-pay | Admitting: Obstetrics and Gynecology

## 2010-05-09 ENCOUNTER — Encounter: Payer: 59 | Admitting: Physical Therapy

## 2010-05-09 DIAGNOSIS — Z1231 Encounter for screening mammogram for malignant neoplasm of breast: Secondary | ICD-10-CM

## 2010-05-12 ENCOUNTER — Ambulatory Visit
Admission: RE | Admit: 2010-05-12 | Discharge: 2010-05-12 | Disposition: A | Discharge status: Home / Self Care | Payer: 59 | Source: Ambulatory Visit | Attending: Obstetrics and Gynecology | Admitting: Obstetrics and Gynecology

## 2010-05-12 DIAGNOSIS — Z1231 Encounter for screening mammogram for malignant neoplasm of breast: Secondary | ICD-10-CM

## 2010-05-16 ENCOUNTER — Encounter: Payer: 59 | Admitting: Physical Therapy

## 2010-05-17 ENCOUNTER — Other Ambulatory Visit: Payer: Self-pay | Admitting: Dermatology

## 2010-05-23 ENCOUNTER — Encounter: Payer: 59 | Admitting: Physical Therapy

## 2010-05-30 ENCOUNTER — Encounter: Payer: 59 | Admitting: Physical Therapy

## 2010-06-06 ENCOUNTER — Encounter: Payer: 59 | Admitting: Physical Therapy

## 2010-06-28 ENCOUNTER — Other Ambulatory Visit: Payer: Self-pay | Admitting: *Deleted

## 2010-06-28 MED ORDER — BUDESONIDE-FORMOTEROL FUMARATE 160-4.5 MCG/ACT IN AERO: 2.0000 | INHALATION_SPRAY | Freq: Two times a day (BID) | RESPIRATORY_TRACT | Status: DC

## 2010-06-30 ENCOUNTER — Telehealth: Payer: Self-pay | Admitting: Pulmonary Disease

## 2010-06-30 NOTE — Telephone Encounter (Signed)
lmomtcb for pt 

## 2010-07-03 NOTE — Telephone Encounter (Signed)
atc x1 line busy

## 2010-07-04 NOTE — Telephone Encounter (Signed)
Pt just needed to schedule her 4 month f/u with VS in June. Appt made for 08/14/2010 @ 10:30 pm.

## 2010-07-05 ENCOUNTER — Telehealth: Payer: Self-pay | Admitting: Pulmonary Disease

## 2010-07-05 MED ORDER — BUDESONIDE-FORMOTEROL FUMARATE 160-4.5 MCG/ACT IN AERO: 2.0000 | INHALATION_SPRAY | Freq: Two times a day (BID) | RESPIRATORY_TRACT | Status: DC

## 2010-07-05 NOTE — Telephone Encounter (Signed)
Spoke with pt and notified that we sent correct rx to [facility] this am for #3 inhalers with 1 refill and pt verbalized understanding.

## 2010-07-10 ENCOUNTER — Telehealth: Payer: Self-pay | Admitting: Pulmonary Disease

## 2010-07-10 NOTE — Telephone Encounter (Signed)
Atc. NA unable to leave VM. WCB

## 2010-07-11 NOTE — Telephone Encounter (Signed)
atc NA. unable to leave vm [facility]

## 2010-07-11 NOTE — Telephone Encounter (Signed)
Spoke w/ pt and she states Dr. Craige Cotta will be receiving a form from [facility] in regards to her symbicort. Pt states [facility] sent her only 1 inhaler and she was to get 3 b/c she can get it at the price of $37.50. Pt states she apid $37.50 for just 1 inhaler. She states [facility] is faxing a form over for Dr. Craige Cotta to fill out so she can get the her symbicort cost bundled together for the other 2 inhalers they are sending her. Will hold in my box until fax comes through.   Carver Fila, CMA

## 2010-07-19 ENCOUNTER — Encounter: Payer: Self-pay | Admitting: Pulmonary Disease

## 2010-07-20 ENCOUNTER — Ambulatory Visit (INDEPENDENT_AMBULATORY_CARE_PROVIDER_SITE_OTHER): Payer: 59 | Admitting: Pulmonary Disease

## 2010-07-20 ENCOUNTER — Encounter: Payer: Self-pay | Admitting: Pulmonary Disease

## 2010-07-20 VITALS — BP 118/64 | HR 95 | Temp 98.0°F | Wt 159.8 lb

## 2010-07-20 DIAGNOSIS — J45909 Unspecified asthma, uncomplicated: Secondary | ICD-10-CM

## 2010-07-20 DIAGNOSIS — J310 Chronic rhinitis: Secondary | ICD-10-CM

## 2010-07-20 MED ORDER — BUDESONIDE-FORMOTEROL FUMARATE 160-4.5 MCG/ACT IN AERO: 2.0000 | INHALATION_SPRAY | Freq: Two times a day (BID) | RESPIRATORY_TRACT | Status: DC

## 2010-07-20 NOTE — Patient Instructions (Signed)
Follow up in 6 months 

## 2010-07-20 NOTE — Progress Notes (Signed)
Subjective:    Patient ID: Sonia [name], female    DOB: Sep 03, 1940, 70 y.o.   MRN: [id number] CC: Alex Plotnikov, Marcelle Overlie HPI 70 yo female with Asthma, Rhinitis.  Two weeks ago while at the grocery store she developed right sided flank pain.  She felt like a sudden thump, and the pain lasted for 5 minutes.  She also felt flushed.  She had to stop her shopping, and leave the store.  The pain eventually resolved on it's own, and has not recurred since then.  She saw Dr. Alexander Mt from urology to assess whether her pain was coming from her kidney, and she was told that her kidney function was normal.  She has noticed a low grade temperature for the past 3 months, ranging from 99 to 100F.  She has a prolapsed bladder, and had a pessary placed.  She thinks she was getting a UTI, and had the pessary removed.  At that time she had a foul smelling discharged.  She denies cough, wheeze, sputum, or hemoptysis.  She sometimes feels a fullness in her chest.  Her peak flow usually is between 150 to 200.  She has not needed to use her rescue inhaler much.  She is not having any trouble with her sinuses.  Past Medical History  Diagnosis Date  . Hyperlipidemia   . Family history of coronary artery disease   . Vertigo   . Chronic rhinitis   . GERD (gastroesophageal reflux disease)   . Asthma     PFT 04/15/08, FEV1 1.63(76%), FVC 3.46 (117%), FEV1% 47, TLC 5.68 (113%), DLCO 87%, no BD     Family History  Problem Relation Age of Onset  . Asthma Mother   . Coronary artery disease    . Asthma Sister      History   Social History  . Marital Status: Divorced    Spouse Name: N/A    Number of Children: 2  . Years of Education: N/A   Occupational History  . 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 Smoking status: Never Smoker   . Smokeless tobacco: Not on file  . Alcohol Use: Not on file  . Drug Use: Not on file  . Sexually Active: Not on  file   Other Topics Concern  . Not on file   Social History Narrative  . No narrative on file     Allergies  Allergen Reactions  . Adenosine   . Codeine Sulfate      Outpatient Prescriptions Prior to Visit  Medication Sig Dispense Refill  . albuterol (PROAIR HFA) 108 (90 BASE) MCG/ACT inhaler 1-2 puffs every 4-6 hours as needed       . albuterol (PROVENTIL) (2.5 MG/3ML) 0.083% nebulizer solution 1 vial as needed       . budesonide-formoterol (SYMBICORT) 160-4.5 MCG/ACT inhaler Inhale 2 puffs into the lungs 2 (two) times daily.  3 Inhaler  1  . Hypertonic Nasal Wash (SINUS RINSE) PACK by Nasal route. Daily as needed       . levothyroxine (SYNTHROID) 150 MCG tablet Take 150 mcg by mouth daily.        . Melatonin 3 MG TABS Take by mouth. 1 by mouth at bedtime as needed       . mometasone (NASONEX) 50 MCG/ACT nasal spray 2 sprays once a day as needed       . amoxicillin-clavulanate (AUGMENTIN) 875-125 MG per tablet  Take 1 tablet by mouth 2 (two) times daily.        Marland Kitchen loratadine (CLARITIN) 10 MG tablet Once a day as needed       . methylPREDNISolone (MEDROL, PAK,) 4 MG tablet Take by mouth. Use as directed        Review of Systems    Objective:   Physical Exam Filed Vitals:   07/20/10 1340  BP: 118/64  Pulse: 95  Temp: 98 F (36.7 C)  TempSrc: Oral  Weight: 159 lb 12.8 oz (72.485 kg)  SpO2: 98%   General - Healthy HEENT - no sinus tenderness, no nasal discharge, no oral exudate, no LAN Cardiac - s1s2 regular, no murmur Chest - good air entry, no wheeze/rales/dullness Abd - soft, non-tender, no CVA tenderness, normal bowel sounds Ext - no e/c/c Neuro - normal strength, CN intact, A&O x 3 Psych - normal mood/behavior     Assessment & Plan:   ASTHMA I explained to her that her recent symptom of right flank pain did not appear to be related to her asthma.  This was a solitary event that resolved spontaneously and has not recurred.  Her physical exam today was essentially  normal.  I do not think any specific evaluation is needed at this time.  I also explained to her that her asthma appears to be well controlled, and that there is no indication for repeat chest imaging studies of pulmonary function testing since these results would change management options at this time  Chronic rhinitis Stable on nasal irrigation and nasonex.    Updated Medication List Outpatient Encounter Prescriptions as of 07/20/2010  Medication Sig Dispense Refill  . albuterol (PROAIR HFA) 108 (90 BASE) MCG/ACT inhaler 1-2 puffs every 4-6 hours as needed       . albuterol (PROVENTIL) (2.5 MG/3ML) 0.083% nebulizer solution 1 vial as needed       . budesonide-formoterol (SYMBICORT) 160-4.5 MCG/ACT inhaler Inhale 2 puffs into the lungs 2 (two) times daily.  3 Inhaler  3  . Hypertonic Nasal Wash (SINUS RINSE) PACK by Nasal route. Daily as needed       . levothyroxine (SYNTHROID) 150 MCG tablet Take 150 mcg by mouth daily.        . Melatonin 3 MG TABS Take by mouth. 1 by mouth at bedtime as needed       . mometasone (NASONEX) 50 MCG/ACT nasal spray 2 sprays once a day as needed       . DISCONTD: budesonide-formoterol (SYMBICORT) 160-4.5 MCG/ACT inhaler Inhale 2 puffs into the lungs 2 (two) times daily.  3 Inhaler  1  . DISCONTD: amoxicillin-clavulanate (AUGMENTIN) 875-125 MG per tablet Take 1 tablet by mouth 2 (two) times daily.        Marland Kitchen DISCONTD: loratadine (CLARITIN) 10 MG tablet Once a day as needed       . DISCONTD: methylPREDNISolone (MEDROL, PAK,) 4 MG tablet Take by mouth. Use as directed

## 2010-07-23 ENCOUNTER — Encounter: Payer: Self-pay | Admitting: Pulmonary Disease

## 2010-07-23 NOTE — Assessment & Plan Note (Signed)
I explained to her that her recent symptom of right flank pain did not appear to be related to her asthma.  This was a solitary event that resolved spontaneously and has not recurred.  Her physical exam today was essentially normal.  I do not think any specific evaluation is needed at this time.  I also explained to her that her asthma appears to be well controlled, and that there is no indication for repeat chest imaging studies of pulmonary function testing since these results would change management options at this time

## 2010-07-23 NOTE — Assessment & Plan Note (Signed)
Stable on nasal irrigation and nasonex.

## 2010-07-25 NOTE — Assessment & Plan Note (Signed)
[facility]                             PULMONARY OFFICE NOTE   Sonia [name], Sonia [name]                      MRN:          [id number]  DATE:11/27/2006                            DOB:          07/30/1940    Sonia [name] is a 70 year old white female with history of severe  persistent asthma. She has noticed increased shortness of breath,  wheezing and cough with clear mucus for the last several weeks,  increased post-nasal drainage. She is on Zyrtec; it has helped  partially. She is on the Advair 100/50 one spray b.i.d., ProAir p.r.n. -  she is using ProAir at least 2 and 3 times daily now. Peak flow rates  down to 150 range.   On exam, temperature 98, blood pressure 104/72, pulse 83, saturation 97%  on room air.  CHEST:  Shows inspiratory and expiratory wheeze with poor air flow.  CARDIAC EXAM:  Showed a regular rate and rhythm without S3. Normal S1  and S2.  ABDOMEN:  Was soft, nontender.  EXTREMITIES:  Showed no edema, clubbing or venous disease.  SKIN:  Was clear.  NEUROLOGIC EXAM:  Was intact.  HEENT EXAM:  Showed no jugular venous distention. No lymphadenopathy.  Oropharynx clear.  NECK:  Supple.   IMPRESSION:  On this patient is that of moderate persistent asthma with  flare.   PLAN:  For this patient is for the patient to receive pulse prednisone  40 mg a day with rapid taper. The patient will also receive Astelin 2  sprays b.i.d. in each nostril and will receive Nasonex 2 sprays each  nostril daily. Maintain Advair as currently dosed. A new peak flow meter  was given to the patient. We will see the patient back in return  followup in 2 months.     Charlcie Cradle Delford Field, MD, [facility]  Electronically Signed    PEW/MedQ  DD: 11/27/2006  DT: 11/28/2006  Job #: [id number]   cc:   Rosalyn Gess. Norins, MD

## 2010-07-28 NOTE — Op Note (Signed)
Sonia [name], Sonia [name]             ACCOUNT NO.:  [id number]   MEDICAL RECORD NO.:  [id number]          PATIENT TYPE:  AMB   LOCATION:  DSC                          FACILITY:  [facility]   PHYSICIAN:  Lubertha Basque. Dalldorf, M.D.DATE OF BIRTH:  [birth year]   DATE OF PROCEDURE:  12/07/2003  DATE OF DISCHARGE:                                 OPERATIVE REPORT   PREOPERATIVE DIAGNOSES:  1.  Right knee torn lateral meniscus.  2.  Right knee degeneration.   POSTOPERATIVE DIAGNOSES:  1.  Right knee torn lateral meniscus.  2.  Right knee degeneration.   OPERATION PERFORMED:  1.  Right knee partial lateral meniscectomy.  2.  Right knee abrasion chondroplasty medial and lateral compartments.  3.  Excision nodule.   SURGEON:  Lubertha Basque. Jerl Santos, M.D.   ASSISTANT:  Prince Rome, P.A.   ANESTHESIA:  Knee block and MAC.   INDICATIONS FOR PROCEDURE:  The patient is a 70 year old woman with a many  month history of right knee pain and swelling as well as a pop towards full  extension.  She has failed activity modification and an exercise program and  has undergone an MRI scan which showed a torn lateral meniscus along with  some degenerative changes.  She has pain at rest and pain which keeps her up  at night and she is offered an arthroscopy.  Informed operative consent was  obtained after discussion of possible complications of reaction to  anesthesia and infection.   DESCRIPTION OF PROCEDURE:  The patient was taken to the operating suite  where knee block and MAC were applied.  The patient was positioned supine  and prepped and draped in the normal sterile fashion.  After administration  of preop intravenous antibiotics, an arthroscopy of the right knee was  performed through a total of two portals.  Suprapatellar pouch was benign  while the patellofemoral joint exhibited some grade four change and what  appeared to be a benign hemosiderin stained nodule in a superolateral  position which  appeared to get caught in the patellofemoral joint. It was  felt that this could be causing her pop towards full extension and this was  excised.  A small amount of plica tissue was also excised from this area.  In the medial compartment she had evidence of some grade 3 and 4 change in a  focal area of the medial femoral condyle addressed with a chondroplasty  followed by microfracture abrasion.  The medial meniscus appeared intact.  The ACL and the PCL were intact.  The lateral compartment exhibited  degenerative tear of the anterior horn addressed with a 5% partial lateral  meniscectomy.  Of more significance was a broad area of grade 3 and 4 change  on the lateral femoral condyle which we addressed with a thorough  chondroplasty followed by microfracture abrasion.  We did achieve some  bleeding bone.  The knee was thoroughly irrigated followed by placement of  Marcaine with epinephrine and Depo-Medrol.  Adaptic was placed over the  portals followed by dry gauze and a loose Ace wrap.  Estimated blood loss  and intraoperative fluids can be obtained from anesthesia records.   DISPOSITION:  The patient was extubated in the operating room and taken to  the recovery room in stable condition.  Plans were for the patient to go  home the same day and to follow up in the office in less than a week.  I  will contact her by phone tonight.       PGD/MEDQ  D:  12/07/2003  T:  12/07/2003  Job:  [id number]

## 2010-07-28 NOTE — Op Note (Signed)
   Sonia [name], Sonia [name]                         ACCOUNT NO.:  [id number]   MEDICAL RECORD NO.:  [id number]                   PATIENT TYPE:  AMB   LOCATION:  ENDO                                 FACILITY:  [facility]   PHYSICIAN:  Bernette Redbird, M.D.                DATE OF BIRTH:  [birth year]   DATE OF PROCEDURE:  09/29/2002  DATE OF DISCHARGE:  09/29/2002                                 OPERATIVE REPORT   PROCEDURE:  Esophageal manometry.   INDICATIONS:  A 70 year old female with asthma and probable reflux disease,  being considered for possible antireflux surgery.  Esophageal manometry has  been requested by Dr. Daphine Deutscher.   FINDINGS:  Normal motility study.   DESCRIPTION OF PROCEDURE:  The patient provided written consent for the  procedure, which was performed by the endoscopy nurse at the [facility]  endoscopy unit using a transnasally-placed solid-state catheter.   FINDINGS:  1. LES:  The lower esophageal sphincter had a normal resting pressure of     15.5 mmHg with fairly good relaxation (84%) in association with swallows.  2. Esophageal body:  Esophageal body motility was normal in amplitude and     duration.  Amplitudes ranged from 37 mmHg to 172 mmHg in various portions     of the esophageal body, but in all areas amplitude and duration were     within normal limits.  There was no spontaneous, repetitive, or     aperistaltic activity noted.  3. Upper esophageal sphincter:  The upper esophageal sphincter had normal     resting pressure and appeared to have coordinated relaxation in     association with pharyngeal contractions.   IMPRESSION:  Normal esophageal motility study.  No manometric  contraindication to proposed antireflux surgery.                                               Bernette Redbird, M.D.    RB/MEDQ  D:  10/06/2002  T:  10/07/2002  Job:  [id number]   cc:   Marjory Lies, M.D.  [address]  [employer]  [place] [address]  Fax: [phone]   [place]. Daphine Deutscher,  M.D.  [address]  [place]  [place] [address]  Fax: [phone]

## 2010-07-28 NOTE — Assessment & Plan Note (Signed)
[facility]                              CARDIOLOGY OFFICE NOTE   STEVE, GREGG                      MRN:          [id number]  DATE:01/29/2006                            DOB:          [birth year]    Ms. Bove returns today to discuss the findings of her [employer] profile  and C-reactive protein.   Remarkably, her LDL particles are borderline high at 1522, her small LDL  particles are low at less than 600.  Her LDL particle size is type A or  large, her large HDL particles are high giving her a low-risk category,  total cholesterol was 237, triglycerides 134, HDL 60, LDL 150.   Her C-reactive protein was low at 0.1  She clearly has some subclinical  atherosclerosis as outlined in my previous note.  She has xanthelasma.  She  is quick to point out that her grandmother had the same thing and was 40!   She would like not to take statins.  I have to agree with this remarkably  favorable lipoprotein profile, I would not feel strongly about recommending  those at present.  She is delighted.   PLAN:  1. Continue with diet and exercise.  2. No change in medications.  3. Patient to follow up in a year.     Thomas C. Daleen Squibb, MD, [facility]  Electronically Signed    TCW/MedQ  DD: 01/29/2006  DT: 01/29/2006  Job #: [id number]   cc:   Georgina Quint. Plotnikov, MD  Stann Main[place]. Vincente Poli, M.D.

## 2010-07-28 NOTE — Assessment & Plan Note (Signed)
[facility]                              CARDIOLOGY OFFICE NOTE   ESHAL, PROPPS                      MRN:          [id number]  DATE:01/01/2006                            DOB:          1940-06-27    I was asked by Dr. Marcelle Overlie to evaluate Sonia [name], a  delightful, very intelligent, 70 year old, divorced, white female with  strong family history of coronary disease and hyperlipidemia.   She is totally asymptomatic when carefully questioned today.  Specifically,  she has no chest discomfort with exertion.  No shortness of breath, and no  other ischemic equivalents.   Her cholesterol checked 2 years ago was 218 by Dr. Posey Rea.  Her LDL was  100.  She says she has gotten it down as low as 211 with strict dieting.   She has subclinical atherosclerosis diagnosed on a CT of the abdomen within  the past year.  She was told she had calcification in the aorta.   Her other risk factors are family history.  Her mother died of heart disease  at age 54.  She has a son who died of a myocardial infarction at age 12.   She does not smoke.  Has no history of hypertension, and is not diabetic.  She is limited a little bit with her knee, but she does water aerobics and  light weights two to three times a week, 20 minutes at a time.   CURRENT MEDICATIONS:  1. Synthroid 0.15 mg per day.  2. Advair 1 puff b.i.d.  3. Prilosec 10 mg p.o. b.i.d.  4. TUMS p.r.n.   She has an intolerance to adenosine, which caused a respiratory problem when  she had a Cardiolite in the past.  She is also intolerant of codeine.   SURGERIES:  1. She has had sinus surgery.  2. Hysterectomy.  3. Appendectomy.  4. Right knee.  5. Cyst off the left breast.   FAMILY HISTORY:  As above.   SOCIAL HISTORY:  She is a retired Charity fundraiser.  She worked as a Sports coach.  She is  divorced.  She has 2 children, 1 is deceased.  She has a 75 year old son she  would like to me to  see as well.   REVIEW OF SYMPTOMS:  CONSTITUTIONAL:  No fever.  No chills.  No significant  change in weight loss.  She does have a history of seasonal allergies and  asthma.  HEENT:  No visual changes.  No hearing loss.  NECK:  She has some  cervical disk disease.  She is currently relatively asymptomatic.  PULMONARY:  Asthma and some breathing abnormalities as mentioned above.  GI:  No melena, hematochezia or change in bowel habits.  ENDOCRINE:  History of  hypothyroidism, on replacement therapy.  Her Synthroid has had to be  adjusted numerous times.  EXTREMITIES:  Right knee problems, which limit her  mobility.  She has had no claudication.  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  The rest of the review of systems are negative other than the HPI.   EXAMINATION:  GENERAL:  A very pleasant lady.  VITAL SIGNS:  Her blood pressure is 102/70.  Her pulse is 85 and regular.  Her weight is 163.  Height is 5 foot 5-1/2 inches.  HEENT:  Normocephalic and atraumatic.  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  Facial symmetry is normal.  Dentition is satisfactory.  PERRLA.  Extraocular movements intact.  Sclerae clear.  NECK:  Supple.  Carotid upstrokes are equal bilaterally with no obvious  bruits.  Thyroid is not enlarged.  Trachea is midline.  There is no JVD.  LUNGS:  Clear to auscultation.  HEART:  Reveals a non-displaced PMI.  She has normal S1 and S2 without  gallop or rub.  ABDOMEN:  Soft with good bowel sounds.  No midline bruit.  There is no  pulsatile mass.  There is no hepatomegaly.  EXTREMITIES:  No cyanosis, clubbing or edema.  Pulses are diminished and  difficult to palpate.  Feet are warm.  She has some minimal varicosities.  NEUROLOGICAL:  Exam is intact.   Electrocardiogram is essentially normal except for an rsr prime in V2.   Looking back through her chart, she has had a 2D echocardiogram and a stress  MI-view in the past with Dr. Elsie Lincoln at [facility].  The  echo was September 28, 2003, which was essentially normal except for some impaired LV relaxation  and mild mitral regurgitation.  Stress tests in the past have been negative  for ischemia.  EF was 61%.  This was performed on October 11, 2003.   ASSESSMENT AND PLAN:  1. I have about a 30-minute discussion today with Sonia [name].  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  She also has evidence of      calcification on abdominal CT of her aorta.  I tried to convince her      she clearly has atherosclerosis.  We talked about the difference      between obstructive and nonobstructive plaque as well as clinical      symptomatology.  We also talked about ruptured plaque and inflamed      plaque, and its association with strokes and acute coronary syndromes.  2. After much discussion, she has agreed to obtain a [employer] Profile      including a C-reactive protein.  Her LFT's were recently checked, and      are normal.  She will strongly      consider a statin when we review these results when she returns in a      couple of weeks.  We discussed a lot of the pros and cons of the      statins today.     Thomas C. Daleen Squibb, MD, [facility]    TCW/MedQ  DD: 01/01/2006  DT: 01/02/2006  Job #: [id number]   cc:   Marcelino Duster L. Vincente Poli, M.D.  Georgina Quint. Plotnikov, MD

## 2010-07-28 NOTE — Assessment & Plan Note (Signed)
[facility]                             PULMONARY OFFICE NOTE   Sonia [name], Sonia [name]                      MRN:          [id number]  DATE:06/03/2006                            DOB:          1940-07-16    Sonia [name] is a 70 year old white female, former patient of Dr.  Dianna Limbo, not seen in this office since 2006. She comes in today with a  history of moderate persistent asthma, reflux disease. Recently had a  flare in early March and received pulsed prednisone for a period of 12  days, but did not receive antibiotics after having a flu-like illness.  She is improving now with decreased cough, decreased mucus production.   She is on Advair 100/50 one spray b.i.d., Prilosec 20 mg daily and Pro  Air p.r.n. Other maintenance medications include the Synthroid 0.137 mcg  daily and Tums as needed. She is having no active reflux symptoms at  this time.   PHYSICAL EXAMINATION:  Temperature 98.0, blood pressure 130/80, pulse  82. Saturation was 98% room air.  CHEST: Showed distant breath sounds with prolonged expiratory phase. No  wheeze or rhonchi were noted.  CARDIAC: Showed a regular rate and rhythm without S3. Normal S1, S2.  ABDOMEN: Soft and nontender.  EXTREMITIES: Showed no edema or clubbing.  SKIN: Was clear.  NEUROLOGIC: Examination was intact.  HEENT: Showed no jugular venous distention or lymphadenopathy.  Oropharynx clear.  NECK: Supple.   IMPRESSION:  Impression here would be that of moderate persistent asthma  with stable airway disease.   PLAN:  Plan is for the patient to maintain Advair 100/50 one spray  b.i.d. No further systemic steroids are necessary and we will see the  patient back in return followup in three months.     Charlcie Cradle Delford Field, MD, [facility]  Electronically Signed    PEW/MedQ  DD: 06/03/2006  DT: 06/03/2006  Job #: [id number]   cc:   Georgina Quint. Plotnikov, MD

## 2010-07-28 NOTE — Op Note (Signed)
Sonia [name], BALIK                         ACCOUNT NO.:  [id number]   MEDICAL RECORD NO.:  [id number]                   PATIENT TYPE:  INP   LOCATION:  0445                                 FACILITY:  [facility]   PHYSICIAN:  Michelle L. Vincente Poli, M.D.            DATE OF BIRTH:  1940/06/30   DATE OF PROCEDURE:  09/08/2003  DATE OF DISCHARGE:  09/10/2003                                 OPERATIVE REPORT   PREOPERATIVE DIAGNOSES:  1. Left lower quadrant pain.  2. Adnexal mass.   POSTOPERATIVE DIAGNOSES:  1. Pelvic adhesions involving large bowel.  2. Endometriosis of the left round ligament.   PROCEDURE:  1. Diagnostic laparoscopy.  2. Exploratory laparotomy.  3. Lysis of adhesions.  4. Fulguration of endometriosis on the left round ligament.  5. Right salpingo-oophorectomy.   SURGEON:  Michelle L. Vincente Poli, M.D.   ANESTHESIA:  General.   ESTIMATED BLOOD LOSS:  100 mL.   DESCRIPTION OF PROCEDURE:  The patient was taken to the operating room after  informed consent was obtained. She was then intubated in the standard  fashion.  She was then prepped and draped in the usual sterile fashion, in  and out catheter was used to empty the bladder.  A small infraumbilical  incision was made, the Veress needle was inserted and pneumoperitoneum was  achieved without difficulty.  We then inserted the 11 mm trocar and the  laparoscope was immediately introduced into the abdominal cavity.  Inspection immediately revealed that there was no area of intestinal injury,  no bleeding noted. The patient was placed in Trendelenburg position.  Of  note in the pelvis, there were multiple adhesions involving the omentum and  the large bowel down to the left side of the pelvis. With a sponge stick in  the vagina, we elevated the vaginal cuff, we were not at all able to  identify any left adnexal structure nor any right adnexal structure because  of the adhesions. At this point, I decided to proceed with  laparotomy  because of the multiple adhesions involved.  The laparoscope was removed  from the abdominal cavity, pneumoperitoneum was released and the incision  was then closed with 3-0 Vicryl interrupted. We then made a small low  transverse incision with the scalpel carried down to the fascia, fascia  scored in the midline. The rectus muscles were separated in the midline, the  peritoneum was entered bluntly, peritoneal incision was then stretched. We  then performed peritoneal washings in the event we did identify an adnexal  mass, these were then set aside.  We then performed abdominopelvic  exploration. There were noted to be adhesions to the left pelvic sidewall  involving the sigmoid colon and these were then gently lysed using blunt and  sharp dissection. We then inspected the left side of the pelvis, there was  no identification of any left ovarian or left tubal remnant. There was  evidence of powder  burn lesions on the left round ligament which were then  coagulated using the Bovie.  During the multiple lysis of adhesions, there  was a small little tear of the mesentery of the large bowel and this was  closed using 3-0 silk interrupted's. At no time was even the serosa of the  bowel interrupted.  We then palpated the bowel as well and the lumen  appeared very normal from the splenic flexure down to the sigmoid.  Attention was then turned to the right side of the pelvis where there were  some adhesions that were then lysed but we did identify a tubal remnant as  well as the small ovary and the ureter was carefully identified just below  this. We then elevated the tube and the ovary using a Babcock clamp. There  were some dense fibrous adhesions around the ovary that were lysed using  sharp dissection. We then placed a Kelly clamp just beneath this with  careful attention to avoid the ureter. The specimen was removed using  Metzenbaum scissors and it was sent for frozen section. It  was initially a  tubal segment initially that revealed benign fallopian tube and then second  specimen sent was noted to be benign ovarian tissue.  The pedicle was  secured using suture ligature using #0 Vicryl suture.  Irrigation was  vigorously performed, hemostasis was noted. At the end of the procedure, we  placed Intercede over on the left pelvic sidewall so that the bowel would  hopefully not stick back to that area. I do believe the patient's etiology  of her left lower quadrant pain were the adhesions which was in the same  distribution that her pain was. I think that the bowel adhesions that were  noted might have explained the questionable adnexal mass seen by ultrasound.  At the end of the procedure, all instruments were removed from the abdominal  cavity. All sponge and lap counts were correct at that point. The fascia was  closed using #0 Vicryl continuous running stitch starting in each corner and  meeting in the midline and after irrigation the subcutaneous layer of the  skin was closed with staples. All sponge, lap and instrument counts were  correct x2.  The patient tolerated the procedure well and went to the  recovery room in stable condition.                                               Michelle L. Vincente Poli, M.D.    Florestine Avers  D:  09/14/2003  T:  09/14/2003  Job:  [id number]

## 2010-08-14 ENCOUNTER — Ambulatory Visit: Payer: 59 | Admitting: Pulmonary Disease

## 2010-09-27 ENCOUNTER — Telehealth: Payer: Self-pay | Admitting: Pulmonary Disease

## 2010-09-27 MED ORDER — ALBUTEROL SULFATE HFA 108 (90 BASE) MCG/ACT IN AERS: INHALATION_SPRAY | RESPIRATORY_TRACT | Status: DC

## 2010-09-27 NOTE — Telephone Encounter (Signed)
Rx for proair was sent to [facility]. LMOM informing the pt of this.

## 2010-10-13 ENCOUNTER — Telehealth: Payer: Self-pay | Admitting: Cardiology

## 2010-10-13 NOTE — Telephone Encounter (Signed)
Per pt call, pt having some issues she would like to discuss with a nurse and would like to see Dr. Daleen Squibb ASAP. Pt was not satisfied with the dates and times available for an appointment w/ Dr. Daleen Squibb therefore she asked that I send a note to the nurse asking for her to see Dr. Daleen Squibb ASAP. Please return pt call to advise/discuss.

## 2010-10-16 NOTE — Telephone Encounter (Signed)
Per pt call, pt was told nurse would call her back today and pt said she will be at work today and to please return her call at her work number.

## 2010-10-16 NOTE — Telephone Encounter (Signed)
I spoke with Sonia [name] who says she has been having "arryrhythmias" recently.  She is not short of breath, no chest pain or edema.  She is not experiencing " irregular pulse" at this time.   She did not want to come in to the office for an ekg today b/c " I am not having a problem right now".  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   She will see Tereso Newcomer Wednesday at 9:00 am.  She will call back if she has further problems. Mylo Red RN

## 2010-10-17 ENCOUNTER — Encounter: Payer: Self-pay | Admitting: *Deleted

## 2010-10-17 ENCOUNTER — Telehealth: Payer: Self-pay | Admitting: Cardiology

## 2010-10-17 NOTE — Telephone Encounter (Signed)
Pt calling back to make sure debbie calls her asap

## 2010-10-17 NOTE — Telephone Encounter (Signed)
Per pt call, pt said Sonia [name] told her that she can work her into Dr. Vern Claude schedule this Wednesday. Pt also said she had another angina attack that happened last night and last 4 1/2 hours. Please return pt call to advise/discuss.

## 2010-10-17 NOTE — Telephone Encounter (Signed)
I spoke with pt and she had wanted to know if she needed to come in for an ekg and lab work prior to her appt. With [employer] tomorrow morning at 9am.  I told her Lorin Picket would have to see her first before ordering lab work and that an ekg would be done while she is here in the morning. Pt agrees and thought she could have had it done earlier. Mylo Red RN

## 2010-10-18 ENCOUNTER — Ambulatory Visit (INDEPENDENT_AMBULATORY_CARE_PROVIDER_SITE_OTHER): Payer: 59 | Admitting: Physician Assistant

## 2010-10-18 ENCOUNTER — Encounter: Payer: Self-pay | Admitting: Physician Assistant

## 2010-10-18 ENCOUNTER — Encounter: Payer: Self-pay | Admitting: *Deleted

## 2010-10-18 VITALS — BP 120/82 | HR 82 | Ht 65.0 in | Wt 154.0 lb

## 2010-10-18 DIAGNOSIS — R079 Chest pain, unspecified: Secondary | ICD-10-CM

## 2010-10-18 DIAGNOSIS — E039 Hypothyroidism, unspecified: Secondary | ICD-10-CM | POA: Insufficient documentation

## 2010-10-18 DIAGNOSIS — R002 Palpitations: Secondary | ICD-10-CM

## 2010-10-18 DIAGNOSIS — J45909 Unspecified asthma, uncomplicated: Secondary | ICD-10-CM

## 2010-10-18 LAB — CBC WITH DIFFERENTIAL/PLATELET
Basophils Absolute: 0 10*3/uL (ref 0.0–0.1)
Eosinophils Absolute: 0.1 10*3/uL (ref 0.0–0.7)
Hemoglobin: 14.6 g/dL (ref 12.0–15.0)
Lymphocytes Relative: 26.1 % (ref 12.0–46.0)
MCHC: 33.5 g/dL (ref 30.0–36.0)
Neutro Abs: 4 10*3/uL (ref 1.4–7.7)
Neutrophils Relative %: 65.7 % (ref 43.0–77.0)
Platelets: 153 10*3/uL (ref 150.0–400.0)
RDW: 14.2 % (ref 11.5–14.6)

## 2010-10-18 LAB — BASIC METABOLIC PANEL
BUN: 19 mg/dL (ref 6–23)
CO2: 29 mEq/L (ref 19–32)
Calcium: 9.1 mg/dL (ref 8.4–10.5)
Creatinine, Ser: 1.1 mg/dL (ref 0.4–1.2)
GFR: 54.15 mL/min — ABNORMAL LOW (ref >60.00)
Glucose, Bld: 92 mg/dL (ref 70–99)

## 2010-10-18 NOTE — Assessment & Plan Note (Signed)
Check TSH 

## 2010-10-18 NOTE — Patient Instructions (Signed)
Your physician recommends that you schedule a follow-up appointment in: 4-6 weeks with Dr. Daleen Squibb or Tereso Newcomer, PA-C same day Dr. Daleen Squibb is in the office.  Your physician has requested that you have an exercise tolerance test PLEASE SCHEDULE THIS FOR ABOUT 2 WKS DX 786.50. For further information please visit [email or web address] Please also follow instruction sheet, as given.  Your physician has recommended that you wear an event monitor DX 785.1. Event monitors are medical devices that record the heart's electrical activity. Doctors most often [place] these monitors to diagnose arrhythmias. Arrhythmias are problems with the speed or rhythm of the heartbeat. The monitor is a small, portable device. You can wear one while you do your normal daily activities. This is usually used to diagnose what is causing palpitations/syncope (passing out).  Your physician has requested that you have an echocardiogram DX 786.50. Echocardiography is a painless test that uses sound waves to create images of your heart. It provides your doctor with information about the size and shape of your heart and how well your heart's chambers and valves are working. This procedure takes approximately one hour. There are no restrictions for this procedure.  Your physician recommends that you return for lab work in: TODAY TSH, BMET, CBC W/DIFF 785.1

## 2010-10-18 NOTE — Assessment & Plan Note (Signed)
Atypical for ischemia.  She does have a significant family history.  I will set her up for a plain exercise Treadmill test.  This will also allow me to see if she has exercise induced PVCs.

## 2010-10-18 NOTE — Progress Notes (Signed)
History of Present Illness: Primary Cardiologist:  Dr. Valera Castle   Sonia [name] is a 70 y.o. female who presents for evaluation of palpitations.  She has been seen by Dr. Daleen Squibb in the past for hyperlipidemia and a family history of CAD.  She also has a history of GERD and asthma.  Last echocardiogram 7/05: Normal ejection fraction, mild mitral regurgitation, mild tricuspid regurgitation.  Myoview 8/05: EF 61%, no ischemia, low risk study.  She was last seen by Dr. Daleen Squibb last year when she presented with complaints of vertigo.  Of note, a head CT done prior to this visit at an outside facility was unremarkable.    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  She has gone from night shift to days recently and is having trouble sleeping.  Over the last few days, she has noted some palpitations. She feels like her heart is skipping.  She feels like her balance is off.  She denies syncope or near syncope.  She denies orthopnea, PND or edema.  She has some atypical chest pains from time to time.  She attributes this to her asthma.  She denies exertional symptoms, but is not that active now.  She has asthma that is fairly well controlled.  She denies significant DOE.  Past Medical History  Diagnosis Date  . Hyperlipidemia   . Vertigo   . Chronic rhinitis   . GERD (gastroesophageal reflux disease)   . Asthma     PFT 04/15/08, FEV1 1.63(76%), FVC 3.46 (117%), FEV1% 47, TLC 5.68 (113%), DLCO 87%, no BD  . Melanoma   . Vitamin D deficiency   . Pre-diabetes   . Arrhythmia     Current Outpatient Prescriptions  Medication Sig Dispense Refill  . budesonide-formoterol (SYMBICORT) 160-4.5 MCG/ACT inhaler Inhale 2 puffs into the lungs 2 (two) times daily.  3 Inhaler  3  . levothyroxine (SYNTHROID) 150 MCG tablet Take 150 mcg by mouth daily.        . Melatonin 3 MG TABS Take by mouth. 1 by mouth at bedtime as needed          Allergies: Allergies  Allergen Reactions  . Adenosine   . Codeine Sulfate     Social history:  Nonsmoker  Family history:  Mom had CAD, sister had mitral valve prolapse, brother had significant rhythm issues and her son died from a myocardial infarction.  ROS:  Please see the history of present illness.  All other systems reviewed and negative.   Vital Signs: BP 120/82  Pulse 82  Ht 5\' 5"  (1.651 m)  Wt 154 lb (69.854 kg)  BMI 25.63 kg/m2  PHYSICAL EXAM: Well nourished, well developed, in no acute distress HEENT: normal Neck: no JVD Endocrine: No thyromegaly Vascular: No carotid bruits Cardiac:  normal S1, S2; RRR; no murmur; Positive S4 Lungs:  clear to auscultation bilaterally, no wheezing, rhonchi or rales Abd: soft, nontender, no hepatomegaly Ext: Trace bilateral edema Skin: warm and dry Neuro:  CNs 2-12 intact, no focal abnormalities noted Psych: Normal affect  EKG:  Sinus rhythm, heart rate 82, normal axis, no ischemic changes  ASSESSMENT AND PLAN:

## 2010-10-18 NOTE — Assessment & Plan Note (Signed)
I suspect she is experiencing symptomatic PVCs/PACs.  This is likely brought on by increased stress related to her change in work schedule recently.  They're quite bothersome for her.  I will place her on an event monitor.  I will also get an echocardiogram.  Check labs today: TSH, CBC, basic metabolic panel.  Followup in 4-6 weeks.

## 2010-10-18 NOTE — Assessment & Plan Note (Signed)
Fairly well controlled.  Followup with pulmonology.

## 2010-10-23 ENCOUNTER — Telehealth: Payer: Self-pay | Admitting: Cardiology

## 2010-10-23 ENCOUNTER — Telehealth: Payer: Self-pay | Admitting: Pulmonary Disease

## 2010-10-23 NOTE — Telephone Encounter (Signed)
Pt having a lot of problems with arrhythmias last night, called and made appt for holter tomorrow, pt called back to see if she can come in today

## 2010-10-23 NOTE — Telephone Encounter (Signed)
Spoke with pt and she states her symbicort is causing her to have an arrythmia. Pt states this has been going on x 1 week. Pt states this happens on and off during the day. Pt states this ha[p[ens worse at night and last x 5 hrs. Pt last dose was Sunday morning. Offered pt an apt with TP but pt refused. Offered an apt with KC and pt states she will have to call me back if she can come in. Will await pt call back

## 2010-10-23 NOTE — Telephone Encounter (Signed)
Call patient back to come in today left message.

## 2010-10-24 NOTE — Telephone Encounter (Signed)
Called patient at work number-unavailable at this [employer] took number and "triage" name to have patient call us back-we need to see if patient is wanting to be seen or how she is doing.

## 2010-10-25 NOTE — Telephone Encounter (Signed)
Called and spoke with pt (who is upset that VS was not available on Mon 10-23-10). Pt goes into work at 2:30pm which makes it hard for her to come in to be seen. Relayed that she much rather see VS than have to see a doctor that "does not know" her. Offered pt VS's next available which is Friday at 1:45pm and she states that does not work with her schedule, and next week he is full and the following week he is out of the office. Pt states that she is just disappointed in the "medical" system as a whole. Will inform Budd Palmer of situation and forward to VS as well.

## 2010-10-25 NOTE — Telephone Encounter (Signed)
Per Budd Palmer offer pt appointment 10-30-10 at 9am to arrive at 8:45am to be seen first. Will still forward to VS for review and to call pt if needed.

## 2010-10-26 ENCOUNTER — Telehealth: Payer: Self-pay | Admitting: Cardiology

## 2010-10-26 NOTE — Telephone Encounter (Signed)
Patient has decided to wait awhile to schedule monitor/gxt/echo. she will still keep her appt. with Dr. Daleen Squibb 11/22/10.

## 2010-10-27 NOTE — Telephone Encounter (Signed)
Noted.  Will d/w pt at [facility].

## 2010-10-30 ENCOUNTER — Encounter (INDEPENDENT_AMBULATORY_CARE_PROVIDER_SITE_OTHER): Payer: 59

## 2010-10-30 ENCOUNTER — Ambulatory Visit (HOSPITAL_COMMUNITY): Payer: 59 | Attending: Internal Medicine | Admitting: Radiology

## 2010-10-30 ENCOUNTER — Ambulatory Visit: Payer: 59 | Admitting: Pulmonary Disease

## 2010-10-30 DIAGNOSIS — I379 Nonrheumatic pulmonary valve disorder, unspecified: Secondary | ICD-10-CM | POA: Insufficient documentation

## 2010-10-30 DIAGNOSIS — R002 Palpitations: Secondary | ICD-10-CM

## 2010-10-30 DIAGNOSIS — E785 Hyperlipidemia, unspecified: Secondary | ICD-10-CM | POA: Insufficient documentation

## 2010-10-30 DIAGNOSIS — I079 Rheumatic tricuspid valve disease, unspecified: Secondary | ICD-10-CM | POA: Insufficient documentation

## 2010-10-30 DIAGNOSIS — R079 Chest pain, unspecified: Secondary | ICD-10-CM

## 2010-10-30 DIAGNOSIS — I059 Rheumatic mitral valve disease, unspecified: Secondary | ICD-10-CM | POA: Insufficient documentation

## 2010-10-30 DIAGNOSIS — I2510 Atherosclerotic heart disease of native coronary artery without angina pectoris: Secondary | ICD-10-CM | POA: Insufficient documentation

## 2010-10-31 ENCOUNTER — Other Ambulatory Visit (HOSPITAL_COMMUNITY): Payer: 59 | Admitting: Radiology

## 2010-10-31 NOTE — Telephone Encounter (Signed)
Test results

## 2010-10-31 NOTE — Telephone Encounter (Signed)
Hi Scott, I thought I would pass this on to keep you in the loop about test for pt. Sonia [name]

## 2010-10-31 NOTE — Telephone Encounter (Signed)
Pt scheduled for gxt with Dr. Daleen Squibb on 11/02/10 Mylo Red RN

## 2010-10-31 NOTE — Telephone Encounter (Signed)
Pt calls today b/c she is having quite frequent pvcs and would like to know if her gxt can be sooner. She does think part of this is due to her weekly flip-flopping schedule between working 1st and 2nd shifts.  " I just do not sleep well after working 2:30-11PM".  Pt also reports frequent headaches. Notes positive family history for CAD as well have lost a son.  Pt also needs a medical out of work excuse written for dates 8/21-11/14/10.  Which she would like to pick up on Thursday. Rescheduled test for 11/02/10 at 9:30 am with Dr. Daleen Squibb. Mylo Red RN

## 2010-11-01 ENCOUNTER — Telehealth: Payer: Self-pay | Admitting: Cardiology

## 2010-11-01 NOTE — Telephone Encounter (Signed)
Pt has been rescheduled for GXT as per Mylo Red, RN. Danielle Rankin

## 2010-11-01 NOTE — Telephone Encounter (Signed)
Pt calling on status of rtn to work note

## 2010-11-01 NOTE — Telephone Encounter (Signed)
Patient will have a gxt tomorrow with Dr.Wall & will discuss this with him.

## 2010-11-02 ENCOUNTER — Telehealth: Payer: Self-pay | Admitting: *Deleted

## 2010-11-02 ENCOUNTER — Ambulatory Visit (INDEPENDENT_AMBULATORY_CARE_PROVIDER_SITE_OTHER): Payer: 59 | Admitting: Cardiology

## 2010-11-02 DIAGNOSIS — R079 Chest pain, unspecified: Secondary | ICD-10-CM

## 2010-11-02 MED ORDER — DILTIAZEM HCL ER 60 MG PO CP12: ORAL_CAPSULE | ORAL | Status: DC

## 2010-11-02 NOTE — Progress Notes (Signed)
Exercise Treadmill Test  Pre-Exercise Testing Evaluation Rhythm: sinus tachycardia  Rate: 100   PR:  .13 QRS:  .08  QT:  .35 QTc: .45     Test  Exercise Tolerance Test Ordering MD: Valera Castle, MD  Interpreting MD:  Valera Castle, MD  Unique Test No: 1  Treadmill:  1  Indication for ETT: chest pain - rule out ischemia  Contraindication to ETT: No   Stress Modality: exercise - treadmill  Cardiac Imaging Performed: non   Protocol: standard Bruce - maximal  Max BP:  185/85  Max MPHR (bpm):  148 85% MPR (bpm):  126  MPHR obtained (bpm):  146 % MPHR obtained: 97  Reached 85% MPHR (min:sec):2:30 Total Exercise Time (min-sec):5;21  Workload in METS:  7.0 Borg Scale: 15  Reason ETT Terminated:  desired heart rate attained    ST Segment Analysis At Rest: normal ST segments - no evidence of significant ST depression With Exercise: no evidence of significant ST depression  Other Information Arrhythmia:  No Angina during ETT:  absent (0) Quality of ETT:  diagnostic  ETT Interpretation:  normal - no evidence of ischemia by ST analysis  Comments: Minimal unifocal PVC, no NSVT or arrhythmia   Recommendations: Reassurance Prn Diltiazem 60mg  palpitations

## 2010-11-02 NOTE — Telephone Encounter (Signed)
pt already aware of results. Sonia [name]

## 2010-11-03 ENCOUNTER — Telehealth: Payer: Self-pay | Admitting: Cardiology

## 2010-11-03 ENCOUNTER — Encounter: Payer: Self-pay | Admitting: *Deleted

## 2010-11-03 NOTE — Telephone Encounter (Signed)
Pt was in yesterday and needs a rtn to work note, needs to talk with debbie first

## 2010-11-03 NOTE — Telephone Encounter (Signed)
At pt request work excuse sent to pt place of work Fax # [phone]  Mylo Red RN

## 2010-11-08 ENCOUNTER — Telehealth: Payer: Self-pay | Admitting: Pulmonary Disease

## 2010-11-08 NOTE — Telephone Encounter (Signed)
Spoke with pt and she states she needs to see VS as soon as possible. Pt states she had an apt last week but had to cancel. Pt was upset bc VS did not have any openings. I advised pt to come in 9/5 at 9 am and we would work her in. Pt verbalized understanding

## 2010-11-10 ENCOUNTER — Other Ambulatory Visit (HOSPITAL_COMMUNITY): Payer: 59 | Admitting: Radiology

## 2010-11-10 ENCOUNTER — Encounter: Payer: 59 | Admitting: Physician Assistant

## 2010-11-10 ENCOUNTER — Encounter: Payer: Self-pay | Admitting: Cardiology

## 2010-11-15 ENCOUNTER — Ambulatory Visit: Payer: 59 | Admitting: Pulmonary Disease

## 2010-11-15 ENCOUNTER — Other Ambulatory Visit: Payer: Self-pay | Admitting: Pulmonary Disease

## 2010-11-15 MED ORDER — ALBUTEROL SULFATE HFA 108 (90 BASE) MCG/ACT IN AERS: 2.0000 | INHALATION_SPRAY | Freq: Four times a day (QID) | RESPIRATORY_TRACT | Status: DC | PRN

## 2010-11-15 NOTE — Telephone Encounter (Signed)
rx sent

## 2010-11-22 ENCOUNTER — Telehealth: Payer: Self-pay | Admitting: Pulmonary Disease

## 2010-11-22 ENCOUNTER — Ambulatory Visit: Payer: 59 | Admitting: Cardiology

## 2010-11-22 NOTE — Telephone Encounter (Signed)
She was changed from advair to symbicort because of thrush issues.  This can be a problem with advair. Ok with me to go back to advair.  Call her in one inhaler like before, but no refills.  Please send this message to DR. Sood and let him have the final say on whether to stay on this med.

## 2010-11-22 NOTE — Telephone Encounter (Signed)
PT stated this needs to be a 3 month Rx (90 Day supply). Sonia [name]

## 2010-11-22 NOTE — Telephone Encounter (Signed)
Spoke with the pt and she was placed on symbicort in May 2011. The pt states on 10-15-10 she began to have palpitations and thinks it is due to symbicort. She has called about this previously and was advised to set an appt but the pt has cancelled twice. I advised  the pt she needs an appt to discuss this but the pt refuses. She states she has had a work-up from cardiology and it was fine. She is convinced it is the symbicort.  She states she has some Advair left  And wants to go back on this but she also request a refill as well. Please advise if ok for pt to go back on advair or should she set an appt. Please advise. Carron Curie, CMA Allergies  Allergen Reactions  . Adenosine   . Codeine Sulfate

## 2010-11-22 NOTE — Telephone Encounter (Signed)
Spoke with pt and notified of recs per [facility]. She states needs a 90 day supply of advair. I advised will need to check and see if this is okay with [facility], since he only wanted to give 1 inhaler. Pt became upset and refuses to pay for 1 inhaler when she can get 3 cheaper. I asked KC if this was okay and he states no, only 1 inhaler can be sent. She had thrush on advair, so why would she want 3 more inhalers? I have called to let her know this and NA, LMOMTCB.

## 2010-11-23 NOTE — Telephone Encounter (Signed)
Spoke with the pt and she does not want to have one inhaler, she states she will stay on symbicort until Dr. Craige Cotta can address the message. Dr. Craige Cotta pt states symbicort is giving her PVC's and she wants to go back on advair. She is refusing an appt to come in and discuss. Please advise. Carron Curie, CMA

## 2010-11-24 MED ORDER — FLUTICASONE-SALMETEROL 250-50 MCG/DOSE IN AEPB: 1.0000 | INHALATION_SPRAY | Freq: Two times a day (BID) | RESPIRATORY_TRACT | Status: DC

## 2010-11-24 NOTE — Telephone Encounter (Signed)
Pt is aware of VS recs and states she would like the rx sent to [facility]. rx has been sent

## 2010-11-24 NOTE — Telephone Encounter (Signed)
Can send script for advair 250/50 one puff bid.  Send 90 day supply with one refill.  Patient needs to be aware to rinse mouth after each use.  She is to call if she notices signs of thrush (white build up in mouth, sore throat, difficulty/pain with swallowing).

## 2010-11-28 ENCOUNTER — Other Ambulatory Visit (INDEPENDENT_AMBULATORY_CARE_PROVIDER_SITE_OTHER): Payer: 59

## 2010-11-28 ENCOUNTER — Encounter: Payer: Self-pay | Admitting: Internal Medicine

## 2010-11-28 ENCOUNTER — Telehealth: Payer: Self-pay | Admitting: *Deleted

## 2010-11-28 ENCOUNTER — Ambulatory Visit (INDEPENDENT_AMBULATORY_CARE_PROVIDER_SITE_OTHER): Payer: 59 | Admitting: Internal Medicine

## 2010-11-28 DIAGNOSIS — R350 Frequency of micturition: Secondary | ICD-10-CM

## 2010-11-28 DIAGNOSIS — N390 Urinary tract infection, site not specified: Secondary | ICD-10-CM

## 2010-11-28 LAB — URINALYSIS, ROUTINE W REFLEX MICROSCOPIC
Bilirubin Urine: NEGATIVE
Hgb urine dipstick: NEGATIVE
Ketones, ur: NEGATIVE
Nitrite: NEGATIVE
Urobilinogen, UA: 0.2 (ref 0.0–1.0)

## 2010-11-28 MED ORDER — CIPROFLOXACIN HCL 500 MG PO TABS: 500.0000 mg | ORAL_TABLET | Freq: Two times a day (BID) | ORAL | Status: AC

## 2010-11-28 NOTE — Telephone Encounter (Signed)
Pt c/o right flank pain and urinary frequency. OK for OV and labs prior. Pt informed.

## 2010-11-28 NOTE — Progress Notes (Signed)
  Subjective:    Patient ID: Sonia [name], female    DOB: [birth year], 70 y.o.   MRN: [id number]  HPI   R flank pain, chills, tired since Sat No n/v   Review of Systems  Constitutional: Positive for fever and fatigue. Negative for chills, activity change, appetite change and unexpected weight change.  HENT: Negative for congestion, mouth sores and sinus pressure.   Eyes: Negative for visual disturbance.  Respiratory: Negative for cough and chest tightness.   Gastrointestinal: Negative for nausea and abdominal pain.  Genitourinary: Positive for dysuria, urgency and flank pain. Negative for frequency, difficulty urinating and vaginal pain.  Musculoskeletal: Positive for back pain. Negative for gait problem.  Skin: Negative for pallor and rash.  Neurological: Negative for dizziness, tremors, weakness, numbness and headaches.  Psychiatric/Behavioral: Negative for confusion and sleep disturbance.       Objective:   Physical Exam  Constitutional: She appears well-developed and well-nourished. No distress.  HENT:  Head: Normocephalic.  Right Ear: External ear normal.  Left Ear: External ear normal.  Nose: Nose normal.  Mouth/Throat: Oropharynx is clear and moist.  Eyes: Conjunctivae are normal. Pupils are equal, round, and reactive to light. Right eye exhibits no discharge. Left eye exhibits no discharge.  Neck: Normal range of motion. Neck supple. No JVD present. No tracheal deviation present. No thyromegaly present.  Cardiovascular: Normal rate, regular rhythm and normal heart sounds.   Pulmonary/Chest: No stridor. No respiratory distress. She has no wheezes.  Abdominal: Soft. Bowel sounds are normal. She exhibits no distension and no mass. There is no tenderness. There is no rebound and no guarding.  Musculoskeletal: She exhibits tenderness. She exhibits no edema.  Lymphadenopathy:    She has no cervical adenopathy.  Neurological: She displays normal reflexes. No cranial nerve  deficit. She exhibits normal muscle tone. Coordination normal.  Skin: No rash noted. No erythema.  Psychiatric: She has a normal mood and affect. Her behavior is normal. Judgment and thought content normal.    R flank is tender      Assessment & Plan:

## 2010-11-29 ENCOUNTER — Telehealth: Payer: Self-pay | Admitting: Internal Medicine

## 2010-11-29 NOTE — Telephone Encounter (Signed)
Sonia [name], please, inform patient that her UA was c/w UTI Thx

## 2010-11-30 ENCOUNTER — Telehealth: Payer: Self-pay | Admitting: Pulmonary Disease

## 2010-11-30 NOTE — Telephone Encounter (Signed)
Per our records, this was already sent in.  Called and spoke with [facility] and was informed they did not get the rx we sent on 11/15/10 for # 3 x 3 refills.  Therefore gave verbal order for this.  Called and spoke with pt and informed her of this.  Nothing further needed.

## 2010-11-30 NOTE — Telephone Encounter (Signed)
Pt informed

## 2010-12-04 ENCOUNTER — Encounter: Payer: Self-pay | Admitting: Internal Medicine

## 2010-12-04 DIAGNOSIS — N390 Urinary tract infection, site not specified: Secondary | ICD-10-CM | POA: Insufficient documentation

## 2010-12-04 NOTE — Assessment & Plan Note (Signed)
See newt prescription therapy as reflected on the Med list. UA

## 2010-12-11 ENCOUNTER — Ambulatory Visit (INDEPENDENT_AMBULATORY_CARE_PROVIDER_SITE_OTHER): Payer: Medicare Other | Admitting: Pulmonary Disease

## 2010-12-11 ENCOUNTER — Encounter: Payer: Self-pay | Admitting: Pulmonary Disease

## 2010-12-11 DIAGNOSIS — N390 Urinary tract infection, site not specified: Secondary | ICD-10-CM

## 2010-12-11 DIAGNOSIS — Z23 Encounter for immunization: Secondary | ICD-10-CM

## 2010-12-11 DIAGNOSIS — J45909 Unspecified asthma, uncomplicated: Secondary | ICD-10-CM

## 2010-12-11 DIAGNOSIS — R002 Palpitations: Secondary | ICD-10-CM

## 2010-12-11 MED ORDER — DILTIAZEM HCL 60 MG PO TABS: ORAL_TABLET | ORAL | Status: DC

## 2010-12-11 MED ORDER — BUDESONIDE-FORMOTEROL FUMARATE 160-4.5 MCG/ACT IN AERO: 1.0000 | INHALATION_SPRAY | Freq: Two times a day (BID) | RESPIRATORY_TRACT | Status: DC

## 2010-12-11 MED ORDER — LEVALBUTEROL TARTRATE 45 MCG/ACT IN AERO: 1.0000 | INHALATION_SPRAY | RESPIRATORY_TRACT | Status: DC | PRN

## 2010-12-11 NOTE — Progress Notes (Signed)
Subjective:    Patient ID: Sonia [name], female    DOB: August 20, 1940, 70 y.o.   MRN: [id number]  HPI CC: Sonia [name] 70 yo female with Asthma, and Rhinitis.  She was having trouble with palpitations over the Summer.  She thinks some of this was related to her work schedule.  She has since quit working at [employer].  She also started taking magnesium supplements, and this seems to have helped.  She was seen by cardiology, and advised to use diltiazem as needed if she develops palpitations.  She was treated for a UTI with cipro.  This helped, but her symptoms have recurred.    Her breathing has been okay.  She started using symbicort one puff twice per day again.  This works better than advair, and does not cause trouble as much with thrush.  She is not using albuterol much.  She has noticed palpitations if she has to use proair much, or if she increases her dose of symbicort.  Past Medical History  Diagnosis Date  . Hyperlipidemia   . Vertigo   . Chronic rhinitis   . GERD (gastroesophageal reflux disease)   . Asthma     PFT 04/15/08, FEV1 1.63(76%), FVC 3.46 (117%), FEV1% 47, TLC 5.68 (113%), DLCO 87%, no BD  . Melanoma   . Vitamin D deficiency   . Pre-diabetes   . Arrhythmia   . Diastolic CHF, chronic     Echo August 2012    Family History  Problem Relation Age of Onset  . Asthma Mother   . Coronary artery disease    . Asthma Sister     History   Social History  . Marital Status: Divorced    Number of Children: 2   Occupational History  . 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 Smoking status: Never Smoker   . Alcohol Use: No  . Drug Use: No   Allergies  Allergen Reactions  . Adenosine   . Codeine Sulfate     Outpatient Prescriptions Prior to Visit  Medication Sig Dispense Refill  . albuterol (PROVENTIL HFA;VENTOLIN HFA) 108 (90 BASE) MCG/ACT inhaler Inhale 2 puffs into the lungs every 6 (six)  hours as needed for wheezing or shortness of breath.  3 Inhaler  3  . Fluticasone-Salmeterol (ADVAIR DISKUS) 250-50 MCG/DOSE AEPB Inhale 1 puff into the lungs 2 (two) times daily. Rinse mouth out after use  90 each  1  . levothyroxine (SYNTHROID) 150 MCG tablet Take 150 mcg by mouth daily.        . Melatonin 3 MG TABS Take by mouth. 1 by mouth at bedtime as needed       . diltiazem (CARDIZEM SR) 60 MG 12 hr capsule As needed for palpitations  60 capsule  11   Review of Systems     Objective:   Physical Exam  BP 124/62  Pulse 91  Temp(Src) 98 F (36.7 C) (Oral)  Ht 5\' 5"  (1.651 m)  Wt 156 lb 6.4 oz (70.943 kg)  BMI 26.03 kg/m2  SpO2 99%  General - Healthy  HEENT - no sinus tenderness, no nasal discharge, no oral exudate, no LAN  Cardiac - s1s2 regular, no murmur  Chest - good air entry, no wheeze/rales/dullness  Abd - soft, non-tender, no CVA tenderness, normal bowel sounds  Ext - no e/c/c  Neuro - normal strength, CN intact, A&O x 3  Psych -  normal mood/behavior     Assessment & Plan:   ASTHMA She is to continue symbicort one puff bid and will try xopenex as needed in place of albuterol.  If her palpitations persist, will then need to further adjust her inhaler regimen.  Acute UTI Advised her to follow up with primary care and urology.  Palpitations Not sure if her inhalers are contributing to this.  She will call if these recur.   Updated Medication List Outpatient Encounter Prescriptions as of 12/11/2010  Medication Sig Dispense Refill  . albuterol (PROVENTIL HFA;VENTOLIN HFA) 108 (90 BASE) MCG/ACT inhaler Inhale 2 puffs into the lungs every 6 (six) hours as needed for wheezing or shortness of breath.  3 Inhaler  3  . levothyroxine (SYNTHROID) 150 MCG tablet Take 150 mcg by mouth daily.        . Melatonin 3 MG TABS Take by mouth. 1 by mouth at bedtime as needed       . DISCONTD: Fluticasone-Salmeterol (ADVAIR DISKUS) 250-50 MCG/DOSE AEPB Inhale 1 puff into the lungs  2 (two) times daily. Rinse mouth out after use  90 each  1  . budesonide-formoterol (SYMBICORT) 160-4.5 MCG/ACT inhaler Inhale 1 puff into the lungs 2 (two) times daily.  1 Inhaler  12  . diltiazem (CARDIZEM) 60 MG tablet One pill as needed for palpitations      . levalbuterol (XOPENEX HFA) 45 MCG/ACT inhaler Inhale 1-2 puffs into the lungs every 4 (four) hours as needed for wheezing.  1 Inhaler  12  . DISCONTD: diltiazem (CARDIZEM SR) 60 MG 12 hr capsule As needed for palpitations  60 capsule  11

## 2010-12-11 NOTE — Assessment & Plan Note (Signed)
Advised her to follow up with primary care and urology.

## 2010-12-11 NOTE — Patient Instructions (Signed)
Try xopenex two puffs up to four times per day as needed for cough, wheeze, chest congestion, or shortness of breath Flu shot today Follow up in 6 months

## 2010-12-11 NOTE — Assessment & Plan Note (Signed)
She is to continue symbicort one puff bid and will try xopenex as needed in place of albuterol.  If her palpitations persist, will then need to further adjust her inhaler regimen.

## 2010-12-11 NOTE — Assessment & Plan Note (Signed)
Not sure if her inhalers are contributing to this.  She will call if these recur.

## 2011-03-26 ENCOUNTER — Telehealth: Payer: Self-pay | Admitting: Pulmonary Disease

## 2011-03-26 NOTE — Telephone Encounter (Signed)
ATC PT NA unable to leave VM W[facility]. Pt advair was d/c'd by [facility]

## 2011-03-27 NOTE — Telephone Encounter (Signed)
Spoke with pt. She states that she has stopped her symbicort due to arrythmia and never stopped taking advair. She is requesting that we refill this through her mailorder pharm [facility] for 90 day supply.  Dr. Craige Cotta, pls advise is this is okay thanks!

## 2011-03-28 MED ORDER — FLUTICASONE-SALMETEROL 250-50 MCG/DOSE IN AEPB: 1.0000 | INHALATION_SPRAY | Freq: Two times a day (BID) | RESPIRATORY_TRACT | Status: DC

## 2011-03-28 NOTE — Telephone Encounter (Signed)
Please send 90 supply with 3 refills for advair 250/50 one puff bid.

## 2011-03-28 NOTE — Telephone Encounter (Signed)
Rx has been sent and pt is aware  

## 2011-05-15 DIAGNOSIS — E039 Hypothyroidism, unspecified: Secondary | ICD-10-CM | POA: Diagnosis not present

## 2011-05-29 ENCOUNTER — Other Ambulatory Visit: Payer: Self-pay | Admitting: Pulmonary Disease

## 2011-06-22 ENCOUNTER — Other Ambulatory Visit: Payer: Self-pay | Admitting: Obstetrics and Gynecology

## 2011-06-22 DIAGNOSIS — Z1231 Encounter for screening mammogram for malignant neoplasm of breast: Secondary | ICD-10-CM

## 2011-06-25 ENCOUNTER — Ambulatory Visit (INDEPENDENT_AMBULATORY_CARE_PROVIDER_SITE_OTHER): Payer: Medicare Other | Admitting: Pulmonary Disease

## 2011-06-25 ENCOUNTER — Encounter: Payer: Self-pay | Admitting: Pulmonary Disease

## 2011-06-25 ENCOUNTER — Ambulatory Visit (INDEPENDENT_AMBULATORY_CARE_PROVIDER_SITE_OTHER)
Admission: RE | Admit: 2011-06-25 | Discharge: 2011-06-25 | Disposition: A | Discharge status: Home / Self Care | Payer: Medicare Other | Source: Ambulatory Visit | Attending: Pulmonary Disease | Admitting: Pulmonary Disease

## 2011-06-25 VITALS — BP 106/62 | HR 99 | Temp 98.4°F | Ht 65.0 in | Wt 166.8 lb

## 2011-06-25 DIAGNOSIS — K449 Diaphragmatic hernia without obstruction or gangrene: Secondary | ICD-10-CM | POA: Diagnosis not present

## 2011-06-25 DIAGNOSIS — R0602 Shortness of breath: Secondary | ICD-10-CM | POA: Diagnosis not present

## 2011-06-25 DIAGNOSIS — J45909 Unspecified asthma, uncomplicated: Secondary | ICD-10-CM

## 2011-06-25 DIAGNOSIS — I517 Cardiomegaly: Secondary | ICD-10-CM | POA: Diagnosis not present

## 2011-06-25 DIAGNOSIS — J310 Chronic rhinitis: Secondary | ICD-10-CM

## 2011-06-25 MED ORDER — ALBUTEROL SULFATE HFA 108 (90 BASE) MCG/ACT IN AERS: 2.0000 | INHALATION_SPRAY | Freq: Four times a day (QID) | RESPIRATORY_TRACT | Status: DC | PRN

## 2011-06-25 NOTE — Patient Instructions (Addendum)
Chest xray today Will schedule breathing test (PFT) Follow up in 2 weeks 

## 2011-06-25 NOTE — Assessment & Plan Note (Signed)
Stable on prn nasal irrigation.   

## 2011-06-25 NOTE — Progress Notes (Signed)
Chief Complaint  Patient presents with  . Follow-up    Pt states her breathing has been fine. denies any wheezing, chest tx, wheezing  . Medication Refill    90 day supply proair    History of Present Illness: Sonia [name] is a 71 y.o. female with asthma and rhinitis.   She had to take prednisone in January for asthma exacerbation.  She has been doing better with her breathing recently.  She is not having cough, wheeze, or chest tightness.  She uses albuterol once per week.  Her sinuses are doing okay.  She uses nasal irrigation when her sinuses flare up.  She has some reflux.  She has since retired from work.  She feels this has helped her stress level.  She is modifying her diet, and has started yoga and water aerobics.    She noticed some pain in her RLQ in January after coughing spells.  This has been getting better.  Past Medical History  Diagnosis Date  . Hyperlipidemia   . Vertigo   . Chronic rhinitis   . GERD (gastroesophageal reflux disease)   . Asthma     PFT 04/15/08, FEV1 1.63(76%), FVC 3.46 (117%), FEV1% 47, TLC 5.68 (113%), DLCO 87%, no BD  . Melanoma   . Vitamin d deficiency   . Pre-diabetes   . Arrhythmia   . Diastolic CHF, chronic     Echo August 2012    Past Surgical History  Procedure Date  . Appendectomy   . Abdominal hysterectomy   . Replacement total knee     Right  . Nasal sinus surgery     1972  . Cystectomy     left breast    Allergies  Allergen Reactions  . Adenosine   . Codeine Sulfate     Physical Exam:  Blood pressure 106/62, pulse 99, temperature 98.4 F (36.9 C), temperature source Oral, height 5\' 5"  (1.651 m), weight 166 lb 12.8 oz (75.66 kg), SpO2 98.00%.  Body mass index is 27.76 kg/(m^2).  Wt Readings from Last 2 Encounters:  06/25/11 166 lb 12.8 oz (75.66 kg)  12/11/10 156 lb 6.4 oz (70.943 kg)    General - Healthy  HEENT - no sinus tenderness, no nasal discharge, no oral exudate, no LAN  Cardiac - s1s2 regular, no  murmur  Chest - good air entry, no wheeze/rales/dullness  Abd - soft, non-tender, normal bowel sounds, no organomegaly Ext - no e/c/c  Neuro - normal strength, CN intact, A&O x 3  Psych - normal mood/behavior  Spirometry 06/25/11>>FEV1 0.97 (43%), FEV1% 45   Assessment/Plan:  Outpatient Encounter Prescriptions as of 06/25/2011  Medication Sig Dispense Refill  . albuterol (PROVENTIL HFA;VENTOLIN HFA) 108 (90 BASE) MCG/ACT inhaler Inhale 2 puffs into the lungs every 6 (six) hours as needed for wheezing or shortness of breath.  3 Inhaler  3  . Fluticasone-Salmeterol (ADVAIR DISKUS) 250-50 MCG/DOSE AEPB Inhale 1 puff into the lungs 2 (two) times daily.  180 each  3  . levothyroxine (SYNTHROID) 150 MCG tablet Take 125 mcg by mouth daily.       Marland Kitchen DISCONTD: albuterol (PROVENTIL HFA;VENTOLIN HFA) 108 (90 BASE) MCG/ACT inhaler Inhale 2 puffs into the lungs every 6 (six) hours as needed for wheezing or shortness of breath.  3 Inhaler  3  . DISCONTD: budesonide-formoterol (SYMBICORT) 160-4.5 MCG/ACT inhaler Inhale 1 puff into the lungs 2 (two) times daily.  1 Inhaler  12  . DISCONTD: diltiazem (CARDIZEM) 60 MG tablet One pill  as needed for palpitations      . DISCONTD: levalbuterol (XOPENEX HFA) 45 MCG/ACT inhaler Inhale 1-2 puffs into the lungs every 4 (four) hours as needed for wheezing.  1 Inhaler  12  . DISCONTD: Melatonin 3 MG TABS Take by mouth. 1 by mouth at bedtime as needed         Mirza Kidney Pager:  [phone] 06/25/2011, 10:28 AM

## 2011-06-25 NOTE — Assessment & Plan Note (Addendum)
She has significant drop in FEV1 compared to 2010.  Will repeat full pulmonary function tests and repeat chest xray.

## 2011-06-26 ENCOUNTER — Telehealth: Payer: Self-pay | Admitting: Pulmonary Disease

## 2011-06-26 MED ORDER — OMEPRAZOLE MAGNESIUM 20 MG PO TBEC: 20.0000 mg | DELAYED_RELEASE_TABLET | Freq: Every day | ORAL | Status: DC

## 2011-06-26 NOTE — Telephone Encounter (Signed)
Dg Chest 2 View  06/25/2011  *RADIOLOGY REPORT*  Clinical Data: Progressive shortness of breath with exertion  CHEST - 2 VIEW  Comparison: Chest x-ray of 09/08/2003  Findings: No active infiltrate or effusion is seen.  The lungs are slightly hyperaerated.  Mediastinal contours appear stable.  The heart is mildly enlarged.  There is now a moderate sized hiatal hernia present.  No bony abnormality is seen.  IMPRESSION:  1.  Hyperaeration.  No active lung disease. 2.  Stable cardiomegaly. 3.  There is a moderate sized hiatal hernia now present.  Original Report Authenticated By: Juline Patch, M.D.     Results reviewed with patient over phone.  Advised okay to resume prilosec and discuss proper timing of medicine.  She will also try OTC anti-histamine.

## 2011-06-26 NOTE — Telephone Encounter (Signed)
Pt is requesting CXR results from yesterday. Please advise. Jennifer Castillo, CMA  

## 2011-06-28 DIAGNOSIS — E039 Hypothyroidism, unspecified: Secondary | ICD-10-CM | POA: Diagnosis not present

## 2011-07-02 DIAGNOSIS — E063 Autoimmune thyroiditis: Secondary | ICD-10-CM | POA: Diagnosis not present

## 2011-07-02 DIAGNOSIS — E039 Hypothyroidism, unspecified: Secondary | ICD-10-CM | POA: Diagnosis not present

## 2011-07-02 DIAGNOSIS — E162 Hypoglycemia, unspecified: Secondary | ICD-10-CM | POA: Diagnosis not present

## 2011-07-03 ENCOUNTER — Other Ambulatory Visit: Payer: Self-pay | Admitting: Obstetrics and Gynecology

## 2011-07-03 ENCOUNTER — Other Ambulatory Visit: Payer: Medicare Other

## 2011-07-03 DIAGNOSIS — Z780 Asymptomatic menopausal state: Secondary | ICD-10-CM

## 2011-07-05 ENCOUNTER — Ambulatory Visit: Payer: Medicare Other

## 2011-07-06 ENCOUNTER — Ambulatory Visit
Admission: RE | Admit: 2011-07-06 | Discharge: 2011-07-06 | Disposition: A | Discharge status: Home / Self Care | Payer: Medicare Other | Source: Ambulatory Visit | Attending: Obstetrics and Gynecology | Admitting: Obstetrics and Gynecology

## 2011-07-06 DIAGNOSIS — Z13820 Encounter for screening for osteoporosis: Secondary | ICD-10-CM | POA: Diagnosis not present

## 2011-07-06 DIAGNOSIS — Z1231 Encounter for screening mammogram for malignant neoplasm of breast: Secondary | ICD-10-CM | POA: Diagnosis not present

## 2011-07-06 DIAGNOSIS — Z780 Asymptomatic menopausal state: Secondary | ICD-10-CM

## 2011-07-17 ENCOUNTER — Ambulatory Visit: Payer: Medicare Other | Admitting: Pulmonary Disease

## 2011-07-30 ENCOUNTER — Ambulatory Visit (INDEPENDENT_AMBULATORY_CARE_PROVIDER_SITE_OTHER): Payer: Medicare Other | Admitting: Pulmonary Disease

## 2011-07-30 DIAGNOSIS — J449 Chronic obstructive pulmonary disease, unspecified: Secondary | ICD-10-CM

## 2011-07-30 LAB — PULMONARY FUNCTION TEST

## 2011-07-30 NOTE — Progress Notes (Signed)
PFT done today. 

## 2011-07-31 ENCOUNTER — Other Ambulatory Visit: Payer: Self-pay | Admitting: Dermatology

## 2011-07-31 DIAGNOSIS — D0470 Carcinoma in situ of skin of unspecified lower limb, including hip: Secondary | ICD-10-CM | POA: Diagnosis not present

## 2011-07-31 DIAGNOSIS — D239 Other benign neoplasm of skin, unspecified: Secondary | ICD-10-CM | POA: Diagnosis not present

## 2011-07-31 DIAGNOSIS — Z85828 Personal history of other malignant neoplasm of skin: Secondary | ICD-10-CM | POA: Diagnosis not present

## 2011-07-31 DIAGNOSIS — D485 Neoplasm of uncertain behavior of skin: Secondary | ICD-10-CM | POA: Diagnosis not present

## 2011-08-07 ENCOUNTER — Ambulatory Visit (INDEPENDENT_AMBULATORY_CARE_PROVIDER_SITE_OTHER): Payer: Medicare Other | Admitting: Pulmonary Disease

## 2011-08-07 ENCOUNTER — Encounter: Payer: Self-pay | Admitting: Pulmonary Disease

## 2011-08-07 DIAGNOSIS — J449 Chronic obstructive pulmonary disease, unspecified: Secondary | ICD-10-CM

## 2011-08-07 MED ORDER — BUDESONIDE-FORMOTEROL FUMARATE 160-4.5 MCG/ACT IN AERO: 1.0000 | INHALATION_SPRAY | Freq: Two times a day (BID) | RESPIRATORY_TRACT | Status: DC

## 2011-08-07 MED ORDER — MONTELUKAST SODIUM 10 MG PO TABS: 10.0000 mg | ORAL_TABLET | Freq: Every day | ORAL | Status: DC

## 2011-08-07 NOTE — Patient Instructions (Signed)
Symbicort one puff twice per day, and rinse mouth after each use Montelukast (singulair) 10 mg pill>>take one pill nightly at bedtime Follow up in 6 weeks

## 2011-08-07 NOTE — Assessment & Plan Note (Signed)
She will change to symbicort 160/4.5 one puff bid, and add singulair 10 mg qhs.  She is to continue albuterol as needed.

## 2011-08-07 NOTE — Progress Notes (Signed)
Chief Complaint  Patient presents with  . Asthma    Pt states her breathing has been fine since last seen. denies any chst pain, chest tightness, wheezing, cough    History of Present Illness: Sonia [name] is a 71 y.o. female with chronic obstructive asthma and rhinitis.  She never smoked, but has 2nd hand tobacco exposure.  She has occasional chest tightness.  She has been using her albuterol 3 times per day.  She would like to switch back to symbicort.  She felt this worked better.  She does yoga twice per week, and pool exercises once per week.     Past Medical History  Diagnosis Date  . Hyperlipidemia   . Vertigo   . Chronic rhinitis   . GERD (gastroesophageal reflux disease)   . Asthma     PFT 04/15/08, FEV1 1.63(76%), FVC 3.46 (117%), FEV1% 47, TLC 5.68 (113%), DLCO 87%, no BD  . Melanoma   . Vitamin d deficiency   . Pre-diabetes   . Arrhythmia   . Diastolic CHF, chronic     Echo August 2012    Past Surgical History  Procedure Date  . Appendectomy   . Abdominal hysterectomy   . Replacement total knee     Right  . Nasal sinus surgery     1972  . Cystectomy     left breast    Allergies  Allergen Reactions  . Adenosine   . Codeine Sulfate     Physical Exam:  Blood pressure 110/72, pulse 98, temperature 98.5 F (36.9 C), temperature source Oral, height 5' 4.5" (1.638 m), weight 165 lb 6.4 oz (75.025 kg), SpO2 96.00%.  Body mass index is 27.95 kg/(m^2).  Wt Readings from Last 2 Encounters:  08/07/11 165 lb 6.4 oz (75.025 kg)  06/25/11 166 lb 12.8 oz (75.66 kg)    General - Healthy  HEENT - no sinus tenderness, no nasal discharge, no oral exudate, no LAN  Cardiac - s1s2 regular, no murmur  Chest - good air entry, no wheeze/rales/dullness  Abd - soft, non-tender, normal bowel sounds, no organomegaly Ext - no e/c/c  Neuro - normal strength, CN intact, A&O x 3  Psych - normal mood/behavior  Spirometry 06/25/11>>FEV1 0.97 (43%), FEV1% 45  PFT  07/30/11>>FEV1 1.25 (61), FEV1% 47, TLC 4.79 (96%), DLCO 94%, no BD   Assessment/Plan:  Outpatient Encounter Prescriptions as of 08/07/2011  Medication Sig Dispense Refill  . albuterol (PROVENTIL HFA;VENTOLIN HFA) 108 (90 BASE) MCG/ACT inhaler Inhale 2 puffs into the lungs every 6 (six) hours as needed for wheezing or shortness of breath.  3 Inhaler  3  . Fluticasone-Salmeterol (ADVAIR DISKUS) 250-50 MCG/DOSE AEPB Inhale 1 puff into the lungs 2 (two) times daily.  180 each  3  . levothyroxine (SYNTHROID) 150 MCG tablet Take 112 mcg by mouth daily.       Marland Kitchen DISCONTD: omeprazole (PRILOSEC OTC) 20 MG tablet Take 1 tablet (20 mg total) by mouth daily.  28 tablet  1    Faun Mcqueen Pager:  [phone] 08/07/2011, 2:10 PM

## 2011-08-10 ENCOUNTER — Encounter: Payer: Self-pay | Admitting: Pulmonary Disease

## 2011-08-22 DIAGNOSIS — H2510 Age-related nuclear cataract, unspecified eye: Secondary | ICD-10-CM | POA: Diagnosis not present

## 2011-08-22 DIAGNOSIS — H524 Presbyopia: Secondary | ICD-10-CM | POA: Diagnosis not present

## 2011-08-22 DIAGNOSIS — H5210 Myopia, unspecified eye: Secondary | ICD-10-CM | POA: Diagnosis not present

## 2011-09-10 DIAGNOSIS — E039 Hypothyroidism, unspecified: Secondary | ICD-10-CM | POA: Diagnosis not present

## 2011-09-14 DIAGNOSIS — E039 Hypothyroidism, unspecified: Secondary | ICD-10-CM | POA: Diagnosis not present

## 2011-09-19 ENCOUNTER — Encounter: Payer: Self-pay | Admitting: Pulmonary Disease

## 2011-09-19 ENCOUNTER — Ambulatory Visit (INDEPENDENT_AMBULATORY_CARE_PROVIDER_SITE_OTHER): Payer: Medicare Other | Admitting: Pulmonary Disease

## 2011-09-19 VITALS — BP 110/62 | HR 92 | Temp 98.0°F | Ht 65.0 in | Wt 169.2 lb

## 2011-09-19 DIAGNOSIS — J310 Chronic rhinitis: Secondary | ICD-10-CM | POA: Diagnosis not present

## 2011-09-19 DIAGNOSIS — J449 Chronic obstructive pulmonary disease, unspecified: Secondary | ICD-10-CM

## 2011-09-19 NOTE — Assessment & Plan Note (Signed)
Advised her to try using singulair.  She is to call back in few weeks if there is no improvement.

## 2011-09-19 NOTE — Progress Notes (Signed)
Chief Complaint  Patient presents with  . Follow-up    Pt reports she feels "ok", SOB w humitity, chest tightness at times, but overall feels good    History of Present Illness: Sonia [name] is a 71 y.o. 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  She did not start singulair.  She forgot to pick this up from her pharmacy.  She has been taking care of her grand-daughter, and this gets her tired.  She is keeping up with her breathing.  She has not had much cough, wheeze, or chest congestion.  She was having trouble with low energy level, and found to have problems with her thyroid level.  Her medicines have been adjust by Dr. Talmage Nap.  She feels that prilosec has helped her reflux.   Past Medical History  Diagnosis Date  . Hyperlipidemia   . Vertigo   . Chronic rhinitis   . GERD (gastroesophageal reflux disease)   . Asthma     PFT 04/15/08, FEV1 1.63(76%), FVC 3.46 (117%), FEV1% 47, TLC 5.68 (113%), DLCO 87%, no BD  . Melanoma   . Vitamin d deficiency   . Pre-diabetes   . Arrhythmia   . Diastolic CHF, chronic     Echo August 2012    Past Surgical History  Procedure Date  . Appendectomy   . Abdominal hysterectomy   . Replacement total knee     Right  . Nasal sinus surgery     1972  . Cystectomy     left breast    Allergies  Allergen Reactions  . Adenosine   . Codeine Sulfate     Physical Exam:  Blood pressure 110/62, pulse 92, temperature 98 F (36.7 C), temperature source Oral, height 5\' 5"  (1.651 m), weight 169 lb 3.2 oz (76.749 kg), SpO2 95.00%.  Body mass index is 28.16 kg/(m^2).  Wt Readings from Last 2 Encounters:  09/19/11 169 lb 3.2 oz (76.749 kg)  08/07/11 165 lb 6.4 oz (75.025 kg)    General - Healthy  HEENT - no sinus tenderness, no nasal discharge, no oral exudate, no LAN  Cardiac - s1s2 regular, no murmur  Chest - good air entry, no wheeze/rales/dullness  Abd - soft, non-tender, normal bowel sounds, no organomegaly Ext - no e/c/c  Neuro -  normal strength, CN intact, A&O x 3  Psych - normal mood/behavior   Assessment/Plan:  Outpatient Encounter Prescriptions as of 09/19/2011  Medication Sig Dispense Refill  . albuterol (PROVENTIL HFA;VENTOLIN HFA) 108 (90 BASE) MCG/ACT inhaler Inhale 2 puffs into the lungs every 6 (six) hours as needed for wheezing or shortness of breath.  3 Inhaler  3  . budesonide-formoterol (SYMBICORT) 160-4.5 MCG/ACT inhaler Inhale 1 puff into the lungs 2 (two) times daily.  1 Inhaler  12  . levothyroxine (SYNTHROID) 150 MCG tablet Take 125 mcg by mouth. 125 Sat,Sun,Mon and 112 Tues,Wed,Thurs,Fri      . montelukast (SINGULAIR) 10 MG tablet Take 1 tablet (10 mg total) by mouth at bedtime.  30 tablet  11    Keyari Kleeman Pager:  [phone] 09/19/2011, 1:57 PM

## 2011-09-19 NOTE — Patient Instructions (Signed)
Call in two weeks if no difference after starting singulair Follow up in 2 months

## 2011-09-19 NOTE — Assessment & Plan Note (Signed)
Stable on prn nasal irrigation.

## 2011-09-26 ENCOUNTER — Other Ambulatory Visit: Payer: Self-pay | Admitting: Dermatology

## 2011-09-26 DIAGNOSIS — D0470 Carcinoma in situ of skin of unspecified lower limb, including hip: Secondary | ICD-10-CM | POA: Diagnosis not present

## 2011-09-26 DIAGNOSIS — D485 Neoplasm of uncertain behavior of skin: Secondary | ICD-10-CM | POA: Diagnosis not present

## 2011-09-27 DIAGNOSIS — M25549 Pain in joints of unspecified hand: Secondary | ICD-10-CM | POA: Diagnosis not present

## 2011-09-27 DIAGNOSIS — R5383 Other fatigue: Secondary | ICD-10-CM | POA: Diagnosis not present

## 2011-09-27 DIAGNOSIS — M25579 Pain in unspecified ankle and joints of unspecified foot: Secondary | ICD-10-CM | POA: Diagnosis not present

## 2011-09-27 DIAGNOSIS — M25569 Pain in unspecified knee: Secondary | ICD-10-CM | POA: Diagnosis not present

## 2011-10-05 DIAGNOSIS — Z131 Encounter for screening for diabetes mellitus: Secondary | ICD-10-CM | POA: Diagnosis not present

## 2011-10-05 DIAGNOSIS — J45909 Unspecified asthma, uncomplicated: Secondary | ICD-10-CM | POA: Diagnosis not present

## 2011-10-05 DIAGNOSIS — Z1331 Encounter for screening for depression: Secondary | ICD-10-CM | POA: Diagnosis not present

## 2011-10-05 DIAGNOSIS — E039 Hypothyroidism, unspecified: Secondary | ICD-10-CM | POA: Diagnosis not present

## 2011-10-05 DIAGNOSIS — Z13220 Encounter for screening for lipoid disorders: Secondary | ICD-10-CM | POA: Diagnosis not present

## 2011-10-05 DIAGNOSIS — Z0000 Encounter for general adult medical examination without abnormal findings: Secondary | ICD-10-CM | POA: Diagnosis not present

## 2011-10-05 DIAGNOSIS — K219 Gastro-esophageal reflux disease without esophagitis: Secondary | ICD-10-CM | POA: Diagnosis not present

## 2011-11-01 ENCOUNTER — Other Ambulatory Visit: Payer: Self-pay | Admitting: Dermatology

## 2011-11-01 DIAGNOSIS — L819 Disorder of pigmentation, unspecified: Secondary | ICD-10-CM | POA: Diagnosis not present

## 2011-11-01 DIAGNOSIS — D485 Neoplasm of uncertain behavior of skin: Secondary | ICD-10-CM | POA: Diagnosis not present

## 2011-11-02 DIAGNOSIS — E039 Hypothyroidism, unspecified: Secondary | ICD-10-CM | POA: Diagnosis not present

## 2011-11-21 ENCOUNTER — Ambulatory Visit: Payer: Medicare Other | Admitting: Pulmonary Disease

## 2011-12-07 ENCOUNTER — Telehealth: Payer: Self-pay | Admitting: Pulmonary Disease

## 2011-12-07 ENCOUNTER — Ambulatory Visit (INDEPENDENT_AMBULATORY_CARE_PROVIDER_SITE_OTHER): Payer: Medicare Other | Admitting: Pulmonary Disease

## 2011-12-07 ENCOUNTER — Encounter: Payer: Self-pay | Admitting: Pulmonary Disease

## 2011-12-07 VITALS — BP 124/78 | HR 89 | Temp 97.7°F | Ht 65.0 in | Wt 168.8 lb

## 2011-12-07 DIAGNOSIS — J310 Chronic rhinitis: Secondary | ICD-10-CM | POA: Diagnosis not present

## 2011-12-07 DIAGNOSIS — J449 Chronic obstructive pulmonary disease, unspecified: Secondary | ICD-10-CM

## 2011-12-07 MED ORDER — PREDNISONE 10 MG PO TABS: ORAL_TABLET | ORAL | Status: DC

## 2011-12-07 MED ORDER — ALBUTEROL SULFATE (2.5 MG/3ML) 0.083% IN NEBU: 2.5000 mg | INHALATION_SOLUTION | Freq: Four times a day (QID) | RESPIRATORY_TRACT | Status: DC | PRN

## 2011-12-07 MED ORDER — ALBUTEROL SULFATE HFA 108 (90 BASE) MCG/ACT IN AERS: 2.0000 | INHALATION_SPRAY | Freq: Four times a day (QID) | RESPIRATORY_TRACT | Status: DC | PRN

## 2011-12-07 MED ORDER — ALBUTEROL SULFATE (2.5 MG/3ML) 0.083% IN NEBU
2.5000 mg | INHALATION_SOLUTION | Freq: Once | RESPIRATORY_TRACT | Status: AC
  Administered 2011-12-07: 2.5 mg via RESPIRATORY_TRACT

## 2011-12-07 MED ORDER — CEFUROXIME AXETIL 500 MG PO TABS: 500.0000 mg | ORAL_TABLET | Freq: Two times a day (BID) | ORAL | Status: DC

## 2011-12-07 NOTE — Progress Notes (Signed)
Chief Complaint  Patient presents with  . Follow-up    c/o sore throat, chest tx, wheezing, dry cough w/ exertion, slight PND. x yesterday    History of Present Illness: Sonia [name] is a 71 y.o. 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  She had a rough summer.  She had trouble with cough and rib soreness.  She has not been using singulair.  She was exposed to a virus from her grand-daughter about 5 days ago.  Since then she has headache, sore throat, cough with clear to yellow mucus, and wheezing.  She has felt warm, but denies fever.  Her sinuses have been congested, and she is using nasonex now.  She has been using her albuterol more.   Tests: PFT 07/30/11>>FEV1 1.25 (61%), FEV1% 47, TLC 4.79 (96%), DLCO 94%, no BD  Past Medical History  Diagnosis Date  . Hyperlipidemia   . Vertigo   . Chronic rhinitis   . GERD (gastroesophageal reflux disease)   . Asthma     PFT 04/15/08, FEV1 1.63(76%), FVC 3.46 (117%), FEV1% 47, TLC 5.68 (113%), DLCO 87%, no BD  . Melanoma   . Vitamin d deficiency   . Pre-diabetes   . Arrhythmia   . Diastolic CHF, chronic     Echo August 2012    Past Surgical History  Procedure Date  . Appendectomy   . Abdominal hysterectomy   . Replacement total knee     Right  . Nasal sinus surgery     1972  . Cystectomy     left breast    Allergies  Allergen Reactions  . Adenosine   . Codeine Sulfate     Physical Exam:  Blood pressure 124/78, pulse 89, temperature 97.7 F (36.5 C), temperature source Oral, height 5\' 5"  (1.651 m), weight 168 lb 12.8 oz (76.567 kg), SpO2 98.00%.  Body mass index is 28.09 kg/(m^2).  Wt Readings from Last 2 Encounters:  12/07/11 168 lb 12.8 oz (76.567 kg)  09/19/11 169 lb 3.2 oz (76.749 kg)    General - Healthy  HEENT - TM clear, no sinus tenderness, clear nasal discharge, no oral exudate, no LAN  Cardiac - s1s2 regular, no murmur  Chest - coarse b/l expiratory wheeze Abd - soft, non-tender, normal bowel  sounds, no organomegaly Ext - no e/c/c  Neuro - normal strength, CN intact, A&O x 3  Psych - normal mood/behavior   Assessment/Plan:  Outpatient Encounter Prescriptions as of 12/07/2011  Medication Sig Dispense Refill  . albuterol (PROAIR HFA) 108 (90 BASE) MCG/ACT inhaler Inhale 2 puffs into the lungs every 6 (six) hours as needed.  1 Inhaler  5  . budesonide-formoterol (SYMBICORT) 160-4.5 MCG/ACT inhaler Inhale 2 puffs into the lungs 2 (two) times daily.      Marland Kitchen levothyroxine (SYNTHROID) 150 MCG tablet Take 125 mcg by mouth. 125 Sat,Sun,Mon and 112 Tues,Wed,Thurs,Fri      . mometasone (NASONEX) 50 MCG/ACT nasal spray Place 2 sprays into the nose daily as needed.      Marland Kitchen DISCONTD: albuterol (PROAIR HFA) 108 (90 BASE) MCG/ACT inhaler Inhale 2 puffs into the lungs every 6 (six) hours as needed.      Marland Kitchen DISCONTD: budesonide-formoterol (SYMBICORT) 160-4.5 MCG/ACT inhaler Inhale 1 puff into the lungs 2 (two) times daily.  1 Inhaler  12  . albuterol (PROVENTIL HFA;VENTOLIN HFA) 108 (90 BASE) MCG/ACT inhaler Inhale 2 puffs into the lungs every 6 (six) hours as needed for wheezing or shortness of breath.  3 Inhaler  3  . albuterol (PROVENTIL) (2.5 MG/3ML) 0.083% nebulizer solution Take 3 mLs (2.5 mg total) by nebulization every 6 (six) hours as needed for wheezing.  75 mL  12  . cefUROXime (CEFTIN) 500 MG tablet Take 1 tablet (500 mg total) by mouth 2 (two) times daily.  14 tablet  0  . montelukast (SINGULAIR) 10 MG tablet Take 1 tablet (10 mg total) by mouth at bedtime.  30 tablet  11  . predniSONE (DELTASONE) 10 MG tablet Use as directed  90 tablet  0    Khaden Gater Pager:  [phone] 12/07/2011, 1:56 PM

## 2011-12-07 NOTE — Assessment & Plan Note (Signed)
She can continue nasonex as needed.   

## 2011-12-07 NOTE — Assessment & Plan Note (Signed)
She has acute exacerbation likely related to viral illness.  She is to use prednisone for next several days.  I have given her script for ceftin, that she can fill if her symptoms get worse.    She is to continue symbicort, singulair, and prn albuterol.  I will refill her albuterol nebulizer meds, and arrange for new nebulizer machine.

## 2011-12-07 NOTE — Telephone Encounter (Signed)
Made in error. Sonia [name]  °

## 2011-12-07 NOTE — Addendum Note (Signed)
Addended by: Tommie Sams on: 12/07/2011 02:08 PM   Modules accepted: Orders

## 2011-12-07 NOTE — Patient Instructions (Signed)
Prednisone 10 mg pills>>3 pills for 2 days, 2 pills for 2 days, 1 pill for 2 days, 1/2 pill for 2 days Ceftin 500 mg pills>>use 1 pill twice per day for 7 days if symptoms gets worse Will arrange for home nebulizer machine Call if symptoms not better next week Call once symptoms better to get flu shot Follow up in 3 months

## 2011-12-27 ENCOUNTER — Telehealth: Payer: Self-pay | Admitting: Pulmonary Disease

## 2011-12-27 MED ORDER — NYSTATIN 100000 UNIT/ML MT SUSP: 500000.0000 [IU] | Freq: Four times a day (QID) | OROMUCOSAL | Status: DC

## 2011-12-27 NOTE — Telephone Encounter (Signed)
Please inform pt that I have sent script nystatin swish and swallow 5 ml four times daily for 5 days.

## 2011-12-27 NOTE — Telephone Encounter (Signed)
Pt aware of VS recs. Verbalized understanding of directions. Nothing further was needed

## 2011-12-27 NOTE — Telephone Encounter (Signed)
I spoke with pt and she stated she has thrush at the back of her throat x 2 weeks now. Per pt she gets this everytime she takes prednisone. She is asking to have something called in for this. Please advise Dr. Craige Cotta thanks  Allergies  Allergen Reactions  . Adenosine   . Codeine Sulfate

## 2012-01-07 DIAGNOSIS — E162 Hypoglycemia, unspecified: Secondary | ICD-10-CM | POA: Diagnosis not present

## 2012-01-07 DIAGNOSIS — E063 Autoimmune thyroiditis: Secondary | ICD-10-CM | POA: Diagnosis not present

## 2012-01-07 DIAGNOSIS — E039 Hypothyroidism, unspecified: Secondary | ICD-10-CM | POA: Diagnosis not present

## 2012-01-14 ENCOUNTER — Other Ambulatory Visit: Payer: Self-pay | Admitting: Obstetrics and Gynecology

## 2012-01-14 DIAGNOSIS — Z01419 Encounter for gynecological examination (general) (routine) without abnormal findings: Secondary | ICD-10-CM | POA: Diagnosis not present

## 2012-01-14 DIAGNOSIS — Z124 Encounter for screening for malignant neoplasm of cervix: Secondary | ICD-10-CM | POA: Diagnosis not present

## 2012-01-29 DIAGNOSIS — D485 Neoplasm of uncertain behavior of skin: Secondary | ICD-10-CM | POA: Diagnosis not present

## 2012-01-30 ENCOUNTER — Other Ambulatory Visit: Payer: Self-pay | Admitting: Dermatology

## 2012-01-30 DIAGNOSIS — D485 Neoplasm of uncertain behavior of skin: Secondary | ICD-10-CM | POA: Diagnosis not present

## 2012-02-18 ENCOUNTER — Ambulatory Visit (INDEPENDENT_AMBULATORY_CARE_PROVIDER_SITE_OTHER): Payer: Medicare Other | Admitting: Pulmonary Disease

## 2012-02-18 ENCOUNTER — Encounter: Payer: Self-pay | Admitting: Pulmonary Disease

## 2012-02-18 VITALS — BP 110/60 | HR 83 | Temp 97.7°F | Ht 64.0 in | Wt 164.0 lb

## 2012-02-18 DIAGNOSIS — J449 Chronic obstructive pulmonary disease, unspecified: Secondary | ICD-10-CM

## 2012-02-18 NOTE — Progress Notes (Signed)
Chief Complaint  Patient presents with  . COPD    Breathing is unchanged since last OV. Denies chest pain, tightness or increased SOB.    History of Present Illness: Sonia [name] is a 71 y.o. 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  She is feeling better.  She is not needing proair much.  She had her synthroid dose increased.  Tests: PFT 07/30/11>>FEV1 1.25 (61%), FEV1% 47, TLC 4.79 (96%), DLCO 94%, no BD  Past Medical History  Diagnosis Date  . Hyperlipidemia   . Vertigo   . Chronic rhinitis   . GERD (gastroesophageal reflux disease)   . Asthma   . Melanoma   . Vitamin D deficiency   . Pre-diabetes   . Arrhythmia   . Diastolic CHF, chronic     Echo August 2012    Past Surgical History  Procedure Date  . Appendectomy   . Abdominal hysterectomy   . Replacement total knee     Right  . Nasal sinus surgery     1972  . Cystectomy     left breast    Allergies  Allergen Reactions  . Adenosine   . Codeine Sulfate     Physical Exam:  Filed Vitals:   02/18/12 1336  BP: 110/60  Pulse: 83  Temp: 97.7 F (36.5 C)  Height: 5\' 4"  (1.626 m)  Weight: 164 lb (74.39 kg)  SpO2: 98%    Body mass index is 28.15 kg/(m^2).   Wt Readings from Last 2 Encounters:  02/18/12 164 lb (74.39 kg)  12/07/11 168 lb 12.8 oz (76.567 kg)    General - Healthy  HEENT - TM clear, no sinus tenderness, clear nasal discharge, no oral exudate, no LAN  Cardiac - s1s2 regular, no murmur  Chest - no wheeze Abd - soft, non-tender Ext - no e/c/c  Neuro - normal strength Psych - normal mood/behavior   Assessment/Plan:  [name], MD [facility] Pulmonary/Critical Care 02/18/2012, 1:42 PM Pager:  [phone] After 3pm call: [phone]

## 2012-02-18 NOTE — Patient Instructions (Signed)
Follow up in 6 months with chest xray 

## 2012-02-18 NOTE — Assessment & Plan Note (Addendum)
She is doing better.  Will give flu shot today.  She is to continue symbicort with prn albuterol.  Will f/u in 6 months with chest xray.

## 2012-03-08 DIAGNOSIS — J45909 Unspecified asthma, uncomplicated: Secondary | ICD-10-CM | POA: Diagnosis not present

## 2012-03-08 DIAGNOSIS — R062 Wheezing: Secondary | ICD-10-CM | POA: Diagnosis not present

## 2012-04-02 ENCOUNTER — Other Ambulatory Visit (HOSPITAL_COMMUNITY): Payer: Self-pay | Admitting: Geriatric Medicine

## 2012-04-02 ENCOUNTER — Other Ambulatory Visit: Payer: Self-pay | Admitting: Geriatric Medicine

## 2012-04-02 DIAGNOSIS — R109 Unspecified abdominal pain: Secondary | ICD-10-CM

## 2012-04-02 DIAGNOSIS — K219 Gastro-esophageal reflux disease without esophagitis: Secondary | ICD-10-CM | POA: Diagnosis not present

## 2012-04-03 ENCOUNTER — Ambulatory Visit (HOSPITAL_COMMUNITY)
Admission: RE | Admit: 2012-04-03 | Discharge: 2012-04-03 | Disposition: A | Discharge status: Home / Self Care | Payer: Medicare Other | Source: Ambulatory Visit | Attending: Geriatric Medicine | Admitting: Geriatric Medicine

## 2012-04-03 DIAGNOSIS — R109 Unspecified abdominal pain: Secondary | ICD-10-CM | POA: Diagnosis not present

## 2012-04-03 DIAGNOSIS — Z85820 Personal history of malignant melanoma of skin: Secondary | ICD-10-CM | POA: Insufficient documentation

## 2012-04-03 DIAGNOSIS — D739 Disease of spleen, unspecified: Secondary | ICD-10-CM | POA: Insufficient documentation

## 2012-04-03 DIAGNOSIS — Q619 Cystic kidney disease, unspecified: Secondary | ICD-10-CM | POA: Insufficient documentation

## 2012-04-03 DIAGNOSIS — C439 Malignant melanoma of skin, unspecified: Secondary | ICD-10-CM | POA: Diagnosis not present

## 2012-04-04 ENCOUNTER — Other Ambulatory Visit: Payer: Self-pay | Admitting: Gastroenterology

## 2012-04-04 ENCOUNTER — Other Ambulatory Visit (HOSPITAL_COMMUNITY): Payer: Self-pay | Admitting: Gastroenterology

## 2012-04-04 DIAGNOSIS — R1013 Epigastric pain: Secondary | ICD-10-CM

## 2012-04-04 DIAGNOSIS — R109 Unspecified abdominal pain: Secondary | ICD-10-CM

## 2012-04-11 DIAGNOSIS — E039 Hypothyroidism, unspecified: Secondary | ICD-10-CM | POA: Diagnosis not present

## 2012-04-16 ENCOUNTER — Ambulatory Visit
Admission: RE | Admit: 2012-04-16 | Discharge: 2012-04-16 | Disposition: A | Discharge status: Home / Self Care | Payer: Medicare Other | Source: Ambulatory Visit | Attending: Gastroenterology | Admitting: Gastroenterology

## 2012-04-16 DIAGNOSIS — K449 Diaphragmatic hernia without obstruction or gangrene: Secondary | ICD-10-CM | POA: Diagnosis not present

## 2012-04-16 DIAGNOSIS — K219 Gastro-esophageal reflux disease without esophagitis: Secondary | ICD-10-CM | POA: Diagnosis not present

## 2012-04-16 DIAGNOSIS — R1013 Epigastric pain: Secondary | ICD-10-CM

## 2012-04-17 DIAGNOSIS — K449 Diaphragmatic hernia without obstruction or gangrene: Secondary | ICD-10-CM | POA: Diagnosis not present

## 2012-04-17 DIAGNOSIS — R1013 Epigastric pain: Secondary | ICD-10-CM | POA: Diagnosis not present

## 2012-04-25 ENCOUNTER — Encounter (HOSPITAL_COMMUNITY): Payer: Medicare Other

## 2012-07-01 DIAGNOSIS — K219 Gastro-esophageal reflux disease without esophagitis: Secondary | ICD-10-CM | POA: Diagnosis not present

## 2012-07-01 DIAGNOSIS — K449 Diaphragmatic hernia without obstruction or gangrene: Secondary | ICD-10-CM | POA: Diagnosis not present

## 2012-07-01 DIAGNOSIS — R1013 Epigastric pain: Secondary | ICD-10-CM | POA: Diagnosis not present

## 2012-07-16 ENCOUNTER — Other Ambulatory Visit: Payer: Self-pay | Admitting: Pulmonary Disease

## 2012-08-01 ENCOUNTER — Encounter (INDEPENDENT_AMBULATORY_CARE_PROVIDER_SITE_OTHER): Payer: Self-pay | Admitting: Surgery

## 2012-08-01 ENCOUNTER — Ambulatory Visit (INDEPENDENT_AMBULATORY_CARE_PROVIDER_SITE_OTHER): Payer: Medicare Other | Admitting: Surgery

## 2012-08-01 VITALS — BP 124/70 | HR 64 | Temp 97.9°F | Resp 14 | Ht 64.5 in | Wt 164.8 lb

## 2012-08-01 DIAGNOSIS — K449 Diaphragmatic hernia without obstruction or gangrene: Secondary | ICD-10-CM | POA: Diagnosis not present

## 2012-08-01 NOTE — Patient Instructions (Addendum)
Nissen Fundoplication Care After Please read the instructions outlined below and refer to this sheet for the next few weeks. These discharge instructions provide you with general information on caring for yourself after you leave the hospital. Your doctor may also give you specific instructions. While your treatment has been planned according to the most current medical practices available, unavoidable complications sometimes happen. If you have any problems or questions after discharge, please call your doctor. ACTIVITY  Take frequent rest periods throughout the day.  Take frequent walks throughout the day. This will help to prevent blood clots.  Continue to do your coughing and deep breathing exercises once you get home. This will help to prevent pneumonia.  No strenuous activities such as heavy lifting, pushing or pulling until after your follow-up visit with your doctor. Do not lift anything heavier than 10 pounds.  Talk with your caregiver about when you may return to work and your exercise routine.  You may shower 2 days after surgery. Pat incisions dry. Do not rub incisions with washcloth or towel.  Do not drive while taking prescription pain medication. NUTRITION  Continue with a liquid diet, or the diet you were directed to take, until your first follow-up visit with your surgeon.  Drink fluids (6-8 glasses a day).  Call your caregiver for persistent nausea (feeling sick to your stomach), vomiting, bloating or difficulty swallowing. ELIMINATION It is very important not to strain during bowel movements. If constipation should occur, you may:  Take a mild laxative (such as Milk of Magnesia).  Add fruit and bran to your diet.  Drink more fluids.  Call your caregiver if constipation is not relieved. FEVER If you feel feverish or have shaking chills, take your temperature. If it is 102 F (38.9 C) or above, call your caregiver. The fever may mean there is an infection. PAIN  CONTROL  If a prescription was given for a pain reliever, please follow your caregiver's directions.  Only take over-the-counter or prescription medicines for pain, discomfort, or fever as directed by your caregiver.  If the pain is not relieved by your medicine, becomes worse, or you have difficulty breathing, call your doctor. INCISION  It is normal for your cuts (incisions) from surgery to have a small amount of drainage for the first 1-2 days. Once the drainage has stopped, leave your incision(s) open to air.  Check your incision(s) and surrounding area daily for any redness, swelling, increased drainage or bleeding. If any of these are present or if the wound edges start to separate, call your doctor.  If you have small adhesive strips in place, they will peel and fall off. (If these strips are covered with a clear bandage, your doctor will tell you when to remove them.)  If you have staples, your caregiver will remove them at the follow-up appointment. Document Released: 10/20/2003 Document Revised: 05/21/2011 Document Reviewed: 01/23/2007 Exit[employer] Patient Information 2014 [employer].  

## 2012-08-01 NOTE — Progress Notes (Signed)
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 female Who was referred by Dr. Matthias Hughs with a large hiatal hernia where approximately a third of her stomach resides in her chest. This is resulted in chest pain that seems to occur when she is at restaurants eating. I have seen her in the past approximately 10 years ago for reflux. She is done much reading about the surgery and watched it on the Internet. Her consultation with me lasted 1 hour and which we talked about potential risks and outcomes from this surgery. I drew pictures to show how the hiatal hernia can be closed and how the wrap was done. She is still thinking about this and once to get back with about actually scheduling the surgery. I went through in detail from the holding area to 4-6 weeks out and describing the various phases of the operation, in-hospital management and postoperative care.  She does have intractable GERD and is dependent on an acid tablets as well as proton except although she's recently had some problems with with pain in her arms and legs from the protonic. I most impressed that her adult onset asthma may well be related in large measure to this chronic problem that she has with reflux. With this large type III hiatal hernia or reflux may actually be better now than it was 20 years ago.  Past Medical History  Diagnosis Date  . Hyperlipidemia   . Vertigo   . Chronic rhinitis   . GERD (gastroesophageal reflux disease)   . Asthma   . Melanoma   . Vitamin D deficiency   . Pre-diabetes   . Arrhythmia   . Arthritis   . COPD (chronic obstructive pulmonary disease)   . Thyroid disease     Past Surgical History  Procedure Laterality Date  . Appendectomy    . Abdominal hysterectomy    . Replacement total knee      Right  . Nasal sinus surgery      1972  . Cystectomy      left breast     Current Outpatient Prescriptions  Medication Sig Dispense Refill  . albuterol (PROAIR HFA) 108 (90 BASE) MCG/ACT inhaler Inhale 2 puffs into the lungs every 6 (six) hours as needed.  1 Inhaler  5  . budesonide-formoterol (SYMBICORT) 160-4.5 MCG/ACT inhaler Inhale 2 puffs into the lungs 2 (two) times daily.      Marland Kitchen levothyroxine (SYNTHROID) 150 MCG tablet Take 125 mcg by mouth. 125 Sat,Sun,Mon and 112 Tues,Wed,Thurs,Fri      . pantoprazole (PROTONIX) 40 MG tablet       . SYMBICORT 160-4.5 MCG/ACT inhaler INHALE TWO PUFFS INTO THE LUNGS BY MOUTH TWICE DAILY  1 Inhaler  3   No current facility-administered medications for this visit.   Adenosine and Codeine sulfate Family History  Problem Relation Age of Onset  . Asthma Mother   . Heart disease Mother   . Coronary artery disease    . Asthma Sister   . Cancer Sister     breast  . Heart disease Sister   . Heart disease Father   . Cancer Brother     prostate  . Cancer Maternal Aunt     breast  . Cancer Maternal Grandmother     kidney, stomach   Social History:   reports that she has never smoked. She has  never used smokeless tobacco. She reports that she does not drink alcohol or use illicit drugs.   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  Physical Exam:   Blood pressure 124/70, pulse 64, temperature 97.9 F (36.6 C), temperature source Temporal, resp. rate 14, height 5' 4.5" (1.638 m), weight 164 lb 12.8 oz (74.753 kg). Body mass index is 27.86 kg/(m^2).  Gen:  WDWN White female NAD  Neurological: Alert and oriented to person, place, and time. Motor and sensory function is grossly intact  Head: Normocephalic and atraumatic.  Eyes: Conjunctivae are normal. Pupils are equal, round, and reactive to light. No scleral icterus.   LABORATORY RESULTS: No results found for this or any previous visit (from the past 48 hour(s)).  I pulled up her recent upper  GI series and showed this to her in some detail.  RADIOLOGY RESULTS: No results found.  Problem List: Patient Active Problem List   Diagnosis Date Noted  . Palpitations 10/18/2010  . Chest pain 10/18/2010  . Hypothyroidism 10/18/2010  . HYPERLIPIDEMIA 06/07/2009  . Chronic rhinitis 03/29/2009  . 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 Recommend laparoscopic repair of large type III hiatal hernia.    Matt B. Daphine Deutscher, MD, [facility] [phone] beeper [phone]  08/01/2012 2:56 PM

## 2012-08-14 ENCOUNTER — Ambulatory Visit (INDEPENDENT_AMBULATORY_CARE_PROVIDER_SITE_OTHER): Payer: Medicare Other | Admitting: Pulmonary Disease

## 2012-08-14 ENCOUNTER — Encounter: Payer: Self-pay | Admitting: Pulmonary Disease

## 2012-08-14 VITALS — BP 110/70 | HR 84 | Temp 97.3°F | Ht 64.5 in | Wt 167.0 lb

## 2012-08-14 DIAGNOSIS — J449 Chronic obstructive pulmonary disease, unspecified: Secondary | ICD-10-CM

## 2012-08-14 DIAGNOSIS — K449 Diaphragmatic hernia without obstruction or gangrene: Secondary | ICD-10-CM

## 2012-08-14 DIAGNOSIS — J310 Chronic rhinitis: Secondary | ICD-10-CM

## 2012-08-14 MED ORDER — TIOTROPIUM BROMIDE MONOHYDRATE 18 MCG IN CAPS: 18.0000 ug | ORAL_CAPSULE | Freq: Every day | RESPIRATORY_TRACT | Status: DC

## 2012-08-14 MED ORDER — ALBUTEROL SULFATE HFA 108 (90 BASE) MCG/ACT IN AERS: 2.0000 | INHALATION_SPRAY | Freq: Four times a day (QID) | RESPIRATORY_TRACT | Status: DC | PRN

## 2012-08-14 NOTE — Progress Notes (Signed)
Chief Complaint  Patient presents with  . COPD    Breathing is stable. Reports that she has been having issues with the humid weather. Feels as if Symbicort is lasting long enough.     History of Present Illness: Sonia [name] is a 72 y.o. 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  Her breathing has been stable.  She does not get much cough, or wheeze.  She is worried that her breathing will get worse in the warm weather of Summer.  She has started pilates and yoga.  She is followed by surgery for her hiatal hernia, and may need surgical intervention.  She has not been using PPI recently because she was concerned that this could be causing her to get cramps.  Her reflux only seems to flare up if she goes out to eat >> this can be very debilitating when she gets an attack.  Tests: PFT 07/30/11>>FEV1 1.25 (61%), FEV1% 47, TLC 4.79 (96%), DLCO 94%, no BD  She  has a past medical history of Hyperlipidemia; Vertigo; Chronic rhinitis; GERD (gastroesophageal reflux disease); Asthma; Melanoma; Vitamin D deficiency; Pre-diabetes; Arrhythmia; Arthritis; COPD (chronic obstructive pulmonary disease); and Thyroid disease.  She  has past surgical history that includes Appendectomy; Abdominal hysterectomy; Replacement total knee; Nasal sinus surgery; and Cystectomy.  Allergies  Allergen Reactions  . Adenosine   . Codeine Sulfate     Physical Exam:  General - Healthy  HEENT - no sinus tenderness, clear nasal discharge, no oral exudate, no LAN  Cardiac - s1s2 regular, no murmur  Chest - no wheeze Abd - soft, non-tender Ext - no e/c/c  Neuro - normal strength Psych - normal mood/behavior   Assessment/Plan:  [name], MD [facility] Pulmonary/Critical Care 08/14/2012, 1:49 PM Pager:  [phone] After 3pm call: [phone]

## 2012-08-14 NOTE — Patient Instructions (Signed)
Try spiriva one puff daily >> call if you want refill Follow up in 4 months

## 2012-08-14 NOTE — Assessment & Plan Note (Signed)
She is to follow up with GI and surgery.

## 2012-08-14 NOTE — Assessment & Plan Note (Signed)
She can continue nasonex as needed.

## 2012-08-14 NOTE — Assessment & Plan Note (Signed)
Stable on current regimen.  Will give her trial of spiriva to determine if this improves her tolerance of outdoor activities during the summer, and reduce her reliance on albuterol.

## 2012-08-26 DIAGNOSIS — H52209 Unspecified astigmatism, unspecified eye: Secondary | ICD-10-CM | POA: Diagnosis not present

## 2012-08-26 DIAGNOSIS — H11129 Conjunctival concretions, unspecified eye: Secondary | ICD-10-CM | POA: Diagnosis not present

## 2012-08-26 DIAGNOSIS — D2310 Other benign neoplasm of skin of unspecified eyelid, including canthus: Secondary | ICD-10-CM | POA: Diagnosis not present

## 2012-09-03 ENCOUNTER — Other Ambulatory Visit: Payer: Self-pay | Admitting: Dermatology

## 2012-09-03 DIAGNOSIS — D485 Neoplasm of uncertain behavior of skin: Secondary | ICD-10-CM | POA: Diagnosis not present

## 2012-09-03 DIAGNOSIS — L570 Actinic keratosis: Secondary | ICD-10-CM | POA: Diagnosis not present

## 2012-09-08 DIAGNOSIS — M5030 Other cervical disc degeneration, unspecified cervical region: Secondary | ICD-10-CM | POA: Diagnosis not present

## 2012-09-08 DIAGNOSIS — M19049 Primary osteoarthritis, unspecified hand: Secondary | ICD-10-CM | POA: Diagnosis not present

## 2012-09-08 DIAGNOSIS — M1710 Unilateral primary osteoarthritis, unspecified knee: Secondary | ICD-10-CM | POA: Diagnosis not present

## 2012-09-08 DIAGNOSIS — M25519 Pain in unspecified shoulder: Secondary | ICD-10-CM | POA: Diagnosis not present

## 2012-09-08 DIAGNOSIS — IMO0001 Reserved for inherently not codable concepts without codable children: Secondary | ICD-10-CM | POA: Diagnosis not present

## 2012-10-06 DIAGNOSIS — K449 Diaphragmatic hernia without obstruction or gangrene: Secondary | ICD-10-CM | POA: Diagnosis not present

## 2012-10-06 DIAGNOSIS — Z0000 Encounter for general adult medical examination without abnormal findings: Secondary | ICD-10-CM | POA: Diagnosis not present

## 2012-10-06 DIAGNOSIS — E785 Hyperlipidemia, unspecified: Secondary | ICD-10-CM | POA: Diagnosis not present

## 2012-11-03 ENCOUNTER — Other Ambulatory Visit: Payer: Self-pay

## 2012-11-03 DIAGNOSIS — Z1231 Encounter for screening mammogram for malignant neoplasm of breast: Secondary | ICD-10-CM

## 2012-11-18 DIAGNOSIS — M549 Dorsalgia, unspecified: Secondary | ICD-10-CM | POA: Diagnosis not present

## 2012-11-18 DIAGNOSIS — M899 Disorder of bone, unspecified: Secondary | ICD-10-CM | POA: Diagnosis not present

## 2012-11-21 ENCOUNTER — Ambulatory Visit: Payer: Medicare Other

## 2012-11-25 DIAGNOSIS — E039 Hypothyroidism, unspecified: Secondary | ICD-10-CM | POA: Diagnosis not present

## 2012-11-28 ENCOUNTER — Ambulatory Visit: Payer: Medicare Other

## 2012-12-01 DIAGNOSIS — E039 Hypothyroidism, unspecified: Secondary | ICD-10-CM | POA: Diagnosis not present

## 2012-12-04 ENCOUNTER — Ambulatory Visit: Payer: Medicare Other

## 2013-01-05 ENCOUNTER — Ambulatory Visit: Payer: Medicare Other

## 2013-01-15 DIAGNOSIS — J329 Chronic sinusitis, unspecified: Secondary | ICD-10-CM | POA: Diagnosis not present

## 2013-01-26 ENCOUNTER — Ambulatory Visit (INDEPENDENT_AMBULATORY_CARE_PROVIDER_SITE_OTHER): Payer: Medicare Other | Admitting: Pulmonary Disease

## 2013-01-26 ENCOUNTER — Encounter: Payer: Self-pay | Admitting: Pulmonary Disease

## 2013-01-26 VITALS — BP 120/76 | HR 81 | Ht 64.5 in | Wt 162.0 lb

## 2013-01-26 DIAGNOSIS — J310 Chronic rhinitis: Secondary | ICD-10-CM | POA: Diagnosis not present

## 2013-01-26 DIAGNOSIS — J449 Chronic obstructive pulmonary disease, unspecified: Secondary | ICD-10-CM

## 2013-01-26 MED ORDER — AMOXICILLIN 875 MG PO TABS: 875.0000 mg | ORAL_TABLET | Freq: Two times a day (BID) | ORAL | Status: DC

## 2013-01-26 NOTE — Progress Notes (Signed)
Chief Complaint  Patient presents with  . COPD    Breathing is unchanged. Reports SOB. Peak flow is 150-170.     History of Present Illness: Sonia [name] is a 72 y.o. 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  Her granddaughter recently stayed with her, and at that time her granddaughter had a cold.  Ms. Consoli got a sinus infection after this.  She was seen in urgent care, and given 10 days of amoxicillin 875 mg.  This helped, but she is worried her symptoms may flare up again now that she has finished antibiotics.  She was getting thick, green drainage from her sinus.  She is not having chest congestion, wheeze, or fever.    Tests: PFT 04/15/08 >> FEV1 1.63 (76%), FEV1% 47, TLC 5.68(113%), DLCO 87%, no BD PFT 07/30/11 >> FEV1 1.25 (61%), FEV1% 47, TLC 4.79 (96%), DLCO 94%, no BD  She  has a past medical history of Hyperlipidemia; Vertigo; Chronic rhinitis; GERD (gastroesophageal reflux disease); Asthma; Melanoma; Vitamin D deficiency; Pre-diabetes; Arrhythmia; Arthritis; COPD (chronic obstructive pulmonary disease); and Thyroid disease.  She  has past surgical history that includes Appendectomy; Abdominal hysterectomy; Replacement total knee; Nasal sinus surgery; and Cystectomy.  Current Outpatient Prescriptions on File Prior to Visit  Medication Sig Dispense Refill  . albuterol (PROAIR HFA) 108 (90 BASE) MCG/ACT inhaler Inhale 2 puffs into the lungs every 6 (six) hours as needed.  4 Inhaler  1  . SYMBICORT 160-4.5 MCG/ACT inhaler INHALE TWO PUFFS INTO THE LUNGS BY MOUTH TWICE DAILY  1 Inhaler  3   No current facility-administered medications on file prior to visit.    Allergies  Allergen Reactions  . Adenosine   . Codeine Sulfate     Physical Exam:  General - Healthy  HEENT - no sinus tenderness, clear nasal discharge, no oral exudate, no LAN  Cardiac - s1s2 regular, no murmur  Chest - no wheeze Abd - soft, non-tender Ext - no e/c/c  Neuro - normal strength Psych -  normal mood/behavior   Assessment/Plan:  [name], MD [facility] Pulmonary/Critical Care 01/26/2013, 1:44 PM Pager:  [phone] After 3pm call: [phone]

## 2013-01-26 NOTE — Patient Instructions (Signed)
Follow up in 6 months 

## 2013-01-27 ENCOUNTER — Encounter: Payer: Self-pay | Admitting: Pulmonary Disease

## 2013-01-27 NOTE — Assessment & Plan Note (Signed)
Stable on current regimen of symbicort and prn proair.  I gave her samples of symbicort.   

## 2013-01-27 NOTE — Assessment & Plan Note (Signed)
She had recent sinus infection >> improved with antibiotics.  I have given her paper script for refill of amoxicillin >> advised her to call before refilling if her symptoms recur.

## 2013-02-10 DIAGNOSIS — L570 Actinic keratosis: Secondary | ICD-10-CM | POA: Diagnosis not present

## 2013-02-10 DIAGNOSIS — D239 Other benign neoplasm of skin, unspecified: Secondary | ICD-10-CM | POA: Diagnosis not present

## 2013-03-16 DIAGNOSIS — M25519 Pain in unspecified shoulder: Secondary | ICD-10-CM | POA: Diagnosis not present

## 2013-03-16 DIAGNOSIS — IMO0001 Reserved for inherently not codable concepts without codable children: Secondary | ICD-10-CM | POA: Diagnosis not present

## 2013-03-16 DIAGNOSIS — M5030 Other cervical disc degeneration, unspecified cervical region: Secondary | ICD-10-CM | POA: Diagnosis not present

## 2013-03-16 DIAGNOSIS — M19049 Primary osteoarthritis, unspecified hand: Secondary | ICD-10-CM | POA: Diagnosis not present

## 2013-03-16 DIAGNOSIS — M1710 Unilateral primary osteoarthritis, unspecified knee: Secondary | ICD-10-CM | POA: Diagnosis not present

## 2013-04-27 ENCOUNTER — Other Ambulatory Visit: Payer: Self-pay | Admitting: Pulmonary Disease

## 2013-05-19 ENCOUNTER — Telehealth: Payer: Self-pay | Admitting: Pulmonary Disease

## 2013-05-19 MED ORDER — ALBUTEROL SULFATE HFA 108 (90 BASE) MCG/ACT IN AERS: 2.0000 | INHALATION_SPRAY | Freq: Four times a day (QID) | RESPIRATORY_TRACT | Status: DC | PRN

## 2013-05-19 NOTE — Telephone Encounter (Signed)
Rx has been sent in. Pt is aware. 

## 2013-05-22 ENCOUNTER — Ambulatory Visit (INDEPENDENT_AMBULATORY_CARE_PROVIDER_SITE_OTHER): Payer: Medicare Other | Admitting: Pulmonary Disease

## 2013-05-22 ENCOUNTER — Encounter: Payer: Self-pay | Admitting: Pulmonary Disease

## 2013-05-22 VITALS — BP 114/72 | HR 98 | Temp 97.7°F | Ht 64.0 in | Wt 169.0 lb

## 2013-05-22 DIAGNOSIS — J310 Chronic rhinitis: Secondary | ICD-10-CM | POA: Diagnosis not present

## 2013-05-22 DIAGNOSIS — J449 Chronic obstructive pulmonary disease, unspecified: Secondary | ICD-10-CM

## 2013-05-22 MED ORDER — AMOXICILLIN 875 MG PO TABS: 875.0000 mg | ORAL_TABLET | Freq: Two times a day (BID) | ORAL | Status: DC

## 2013-05-22 MED ORDER — IPRATROPIUM-ALBUTEROL 0.5-2.5 (3) MG/3ML IN SOLN: 3.0000 mL | Freq: Four times a day (QID) | RESPIRATORY_TRACT | Status: DC | PRN

## 2013-05-22 NOTE — Progress Notes (Signed)
Chief Complaint  Patient presents with  . COPD    Breathing has gotten worse since last OV. Reports SOB, coughing, sinus congestion. Has been exposed to several illnesses from her great granddaughter.    History of Present Illness: Sonia [name] is a 73 y.o. 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  Her grand-daughter has been staying with her.  She got a cold and sinus infection from her.  She took some prednisone and amoxicillin in February, and has slowly improved since then.    Her reflux is okay.  She still gets some rib pressure when she takes a deep breath.  She is using proair a few times per week.  She is using nasal irrigation and nasacort.  Tests: PFT 04/15/08 >> FEV1 1.63 (76%), FEV1% 47, TLC 5.68(113%), DLCO 87%, no BD PFT 07/30/11 >> FEV1 1.25 (61%), FEV1% 47, TLC 4.79 (96%), DLCO 94%, no BD  She  has a past medical history of Hyperlipidemia; Vertigo; Chronic rhinitis; GERD (gastroesophageal reflux disease); Asthma; Melanoma; Vitamin D deficiency; Pre-diabetes; Arrhythmia; Arthritis; COPD (chronic obstructive pulmonary disease); Thyroid disease; and Hiatal hernia.  She  has past surgical history that includes Appendectomy; Abdominal hysterectomy; Replacement total knee; Nasal sinus surgery; and Cystectomy.  Current Outpatient Prescriptions on File Prior to Visit  Medication Sig Dispense Refill  . albuterol (PROAIR HFA) 108 (90 BASE) MCG/ACT inhaler Inhale 2 puffs into the lungs every 6 (six) hours as needed.  3 Inhaler  1  . levothyroxine (SYNTHROID, LEVOTHROID) 137 MCG tablet Take 137 mcg by mouth daily before breakfast.      . SYMBICORT 160-4.5 MCG/ACT inhaler INHALE TWO PUFFS INTO THE LUNGS BY MOUTH TWICE DAILY  1 Inhaler  3   No current facility-administered medications on file prior to visit.    Allergies  Allergen Reactions  . Adenosine   . Codeine Sulfate     Physical Exam:  General - no distress HEENT - no sinus tenderness, no nasal discharge, no oral  exudate, no LAN  Cardiac - s1s2 regular, no murmur  Chest - no wheeze Abd - soft, non-tender Ext - no edema Neuro - normal strength Psych - normal mood/behavior   Assessment/Plan:  Chesley Mires, MD [place] 05/22/2013, 1:58 PM Pager:  [phone] After 3pm call: [phone]

## 2013-05-22 NOTE — Assessment & Plan Note (Signed)
She is to continue nasal irrigation and nasacort.  Will give her script for amoxicillin to keep handy if her sinus infection recurs.

## 2013-05-22 NOTE — Assessment & Plan Note (Signed)
Stable on current regimen of symbicort and prn proair.  I gave her samples of symbicort.

## 2013-05-22 NOTE — Patient Instructions (Signed)
Follow up in 6 months 

## 2013-06-01 DIAGNOSIS — E039 Hypothyroidism, unspecified: Secondary | ICD-10-CM | POA: Diagnosis not present

## 2013-06-02 DIAGNOSIS — E039 Hypothyroidism, unspecified: Secondary | ICD-10-CM | POA: Diagnosis not present

## 2013-06-09 ENCOUNTER — Ambulatory Visit
Admission: RE | Admit: 2013-06-09 | Discharge: 2013-06-09 | Disposition: A | Discharge status: Home / Self Care | Payer: Medicare Other | Source: Ambulatory Visit

## 2013-06-09 DIAGNOSIS — Z1231 Encounter for screening mammogram for malignant neoplasm of breast: Secondary | ICD-10-CM

## 2013-06-11 ENCOUNTER — Other Ambulatory Visit: Payer: Self-pay | Admitting: Internal Medicine

## 2013-06-11 DIAGNOSIS — R928 Other abnormal and inconclusive findings on diagnostic imaging of breast: Secondary | ICD-10-CM

## 2013-06-22 ENCOUNTER — Ambulatory Visit
Admission: RE | Admit: 2013-06-22 | Discharge: 2013-06-22 | Disposition: A | Discharge status: Home / Self Care | Payer: Medicare Other | Source: Ambulatory Visit | Attending: Internal Medicine | Admitting: Internal Medicine

## 2013-06-22 ENCOUNTER — Other Ambulatory Visit: Payer: Self-pay | Admitting: Internal Medicine

## 2013-06-22 DIAGNOSIS — R928 Other abnormal and inconclusive findings on diagnostic imaging of breast: Secondary | ICD-10-CM

## 2013-06-22 DIAGNOSIS — N6489 Other specified disorders of breast: Secondary | ICD-10-CM | POA: Diagnosis not present

## 2013-06-22 DIAGNOSIS — N6320 Unspecified lump in the left breast, unspecified quadrant: Secondary | ICD-10-CM

## 2013-06-23 ENCOUNTER — Ambulatory Visit
Admission: RE | Admit: 2013-06-23 | Discharge: 2013-06-23 | Disposition: A | Discharge status: Home / Self Care | Payer: Medicare Other | Source: Ambulatory Visit | Attending: Internal Medicine | Admitting: Internal Medicine

## 2013-06-23 ENCOUNTER — Other Ambulatory Visit: Payer: Self-pay | Admitting: Internal Medicine

## 2013-06-23 ENCOUNTER — Other Ambulatory Visit: Payer: Medicare Other

## 2013-06-23 DIAGNOSIS — N6320 Unspecified lump in the left breast, unspecified quadrant: Secondary | ICD-10-CM

## 2013-06-23 DIAGNOSIS — N6489 Other specified disorders of breast: Secondary | ICD-10-CM

## 2013-06-25 ENCOUNTER — Telehealth: Payer: Self-pay | Admitting: Pulmonary Disease

## 2013-06-25 MED ORDER — METHYLPREDNISOLONE 4 MG PO TABS: ORAL_TABLET | ORAL | Status: DC

## 2013-06-25 MED ORDER — MOMETASONE FUROATE 50 MCG/ACT NA SUSP: 2.0000 | Freq: Two times a day (BID) | NASAL | Status: DC

## 2013-06-25 MED ORDER — AMOXICILLIN-POT CLAVULANATE 875-125 MG PO TABS: 1.0000 | ORAL_TABLET | Freq: Two times a day (BID) | ORAL | Status: DC

## 2013-06-25 MED ORDER — AMOXICILLIN 875 MG PO TABS: 875.0000 mg | ORAL_TABLET | Freq: Two times a day (BID) | ORAL | Status: DC

## 2013-06-25 NOTE — Telephone Encounter (Signed)
Re-sent rx for amox per VS due to it printed when he sent this. Pt's daughter spoke with VS and aware of all rec's

## 2013-06-25 NOTE — Telephone Encounter (Signed)
Spoke w/ pt. She reports x Monday she has had problems with her asthma. C/o increase SOB, wheezing, chest tx, prod cough-white sticky phlem. She started taking methylprednisone BID x Monday and it does not seem to help. 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 Please advise VS thanks  Allergies  Allergen Reactions  . Adenosine   . Codeine Sulfate     Current Outpatient Prescriptions on File Prior to Visit  Medication Sig Dispense Refill  . albuterol (PROAIR HFA) 108 (90 BASE) MCG/ACT inhaler Inhale 2 puffs into the lungs every 6 (six) hours as needed.  3 Inhaler  1  . albuterol (PROVENTIL) (2.5 MG/3ML) 0.083% nebulizer solution Take 2.5 mg by nebulization every 6 (six) hours as needed for wheezing or shortness of breath.      Marland Kitchen amoxicillin (AMOXIL) 875 MG tablet Take 1 tablet (875 mg total) by mouth 2 (two) times daily.  14 tablet  0  . cetirizine (ZYRTEC) 10 MG tablet Take 10 mg by mouth daily.      Marland Kitchen ipratropium-albuterol (DUONEB) 0.5-2.5 (3) MG/3ML SOLN Take 3 mLs by nebulization every 6 (six) hours as needed.  360 mL  5  . levothyroxine (SYNTHROID, LEVOTHROID) 137 MCG tablet Take 137 mcg by mouth daily before breakfast.      . mometasone (NASONEX) 50 MCG/ACT nasal spray Place 2 sprays into the nose 2 (two) times daily.      . SYMBICORT 160-4.5 MCG/ACT inhaler INHALE TWO PUFFS INTO THE LUNGS BY MOUTH TWICE DAILY  1 Inhaler  3   No current facility-administered medications on file prior to visit.

## 2013-06-25 NOTE — Telephone Encounter (Signed)
Spoke with pt.  Her granddaughter has cold, and she gets trouble with pollen.  This has triggered asthma/copd flare. She is having cough, wheeze, sinus drainage, sinus tenderness, and sputum with white to yellow.  She is unsure about fever.  She started taking medrol on 4/14 >> 36 mg x 2 days, and 20 mg today.  This has helped, but still not better.  Also using nebulizer bid, nasonex, nasal irrigation, zyrtec, and afrin.  She is using symbicort bid.  Will send in refill for medrol.  Will give course of amoxicillin.  She is to continue other regimen >> advised her to limit afrin use to only 2 days.  Will have triage nurse send order for new nebulizer machine (she is not sure hers is working correctly), and refill on duoneb.  Advised pt to call back if no improvement, but then she might need hospital admission.

## 2013-06-29 ENCOUNTER — Encounter: Payer: Self-pay | Admitting: Pulmonary Disease

## 2013-06-29 ENCOUNTER — Ambulatory Visit (INDEPENDENT_AMBULATORY_CARE_PROVIDER_SITE_OTHER): Payer: Medicare Other | Admitting: Pulmonary Disease

## 2013-06-29 VITALS — BP 124/82 | HR 110 | Ht 64.0 in | Wt 168.0 lb

## 2013-06-29 DIAGNOSIS — R059 Cough, unspecified: Secondary | ICD-10-CM | POA: Diagnosis not present

## 2013-06-29 DIAGNOSIS — J449 Chronic obstructive pulmonary disease, unspecified: Secondary | ICD-10-CM

## 2013-06-29 DIAGNOSIS — R05 Cough: Secondary | ICD-10-CM | POA: Diagnosis not present

## 2013-06-29 DIAGNOSIS — R058 Other specified cough: Secondary | ICD-10-CM

## 2013-06-29 DIAGNOSIS — J4489 Other specified chronic obstructive pulmonary disease: Secondary | ICD-10-CM

## 2013-06-29 NOTE — Assessment & Plan Note (Signed)
Related to allergic rhinitis, post-nasal drip, and GERD.  Advised her to continue nasal irrigation and nasacort.  She is to try prilosec on scheduled basis for now.  She can continue zyrtec in AM and benadryl in PM until allergies are better.

## 2013-06-29 NOTE — Patient Instructions (Signed)
Continue nasal irrigation two to three times per day as needed Nasacort two sprays in each nostril twice daily until sinuses better Zyrtec 10 mg daily as needed for allergies Benadryl 25 to 50 mg nightly as needed for allergies Symbicort two puffs twice per day Albuterol up to four times per day as needed Will arrange for new nebulizer machine Finish amoxicillin and medrol Call if not feeling better Follow up in 4 weeks

## 2013-06-29 NOTE — Progress Notes (Signed)
Chief Complaint  Patient presents with  . Acute Visit    Presents today with asthma flare up. Reports coughing, SOB and wheezing. Onset was 1 week ago.    History of Present Illness: Sonia [name] is a 73 y.o. 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  She has been taking medrol and amoxicillin.  These have helped.  She still has lots of sinus drainage.  This is causing irritation in her throat.  She is getting wheeze from her throat and her chest.  She is using albuterol several times per day, and continues to use symbicort.  She is getting headaches.  She has also noticed more trouble with reflux.  Tests: PFT 04/15/08 >> FEV1 1.63 (76%), FEV1% 47, TLC 5.68(113%), DLCO 87%, no BD PFT 07/30/11 >> FEV1 1.25 (61%), FEV1% 47, TLC 4.79 (96%), DLCO 94%, no BD  She  has a past medical history of Hyperlipidemia; Vertigo; Chronic rhinitis; GERD (gastroesophageal reflux disease); Asthma; Melanoma; Vitamin D deficiency; Pre-diabetes; Arrhythmia; Arthritis; COPD (chronic obstructive pulmonary disease); Thyroid disease; and Hiatal hernia.  She  has past surgical history that includes Appendectomy; Abdominal hysterectomy; Replacement total knee; Nasal sinus surgery; and Cystectomy.  Current Outpatient Prescriptions on File Prior to Visit  Medication Sig Dispense Refill  . albuterol (PROAIR HFA) 108 (90 BASE) MCG/ACT inhaler Inhale 2 puffs into the lungs every 6 (six) hours as needed.  3 Inhaler  1  . albuterol (PROVENTIL) (2.5 MG/3ML) 0.083% nebulizer solution Take 2.5 mg by nebulization every 6 (six) hours as needed for wheezing or shortness of breath.      Marland Kitchen amoxicillin (AMOXIL) 875 MG tablet Take 1 tablet (875 mg total) by mouth 2 (two) times daily.  14 tablet  0  . cetirizine (ZYRTEC) 10 MG tablet Take 10 mg by mouth daily.      Marland Kitchen ipratropium-albuterol (DUONEB) 0.5-2.5 (3) MG/3ML SOLN Take 3 mLs by nebulization every 6 (six) hours as needed.  360 mL  5  . levothyroxine (SYNTHROID, LEVOTHROID) 137  MCG tablet Take 137 mcg by mouth daily before breakfast.      . methylPREDNISolone (MEDROL) 4 MG tablet 8 pills for 2 days, 6 pills for 2 days, 4 pills for 2 days, 2 pills for 2 days, 1 pill for 2 days  42 tablet  1  . mometasone (NASONEX) 50 MCG/ACT nasal spray Place 2 sprays into the nose 2 (two) times daily.  17 g  5  . SYMBICORT 160-4.5 MCG/ACT inhaler INHALE TWO PUFFS INTO THE LUNGS BY MOUTH TWICE DAILY  1 Inhaler  3   No current facility-administered medications on file prior to visit.    Allergies  Allergen Reactions  . Adenosine   . Codeine Sulfate     Physical Exam:  General - no distress HEENT - clear nasal discharge, TM clear, no oral exudate, no LAN, raspy voice, auditory wheeze from throat  Cardiac - s1s2 regular, no murmur  Chest - b/l expiratory wheeze Abd - soft, non-tender Ext - no edema Neuro - normal strength Psych - normal mood/behavior   Assessment/Plan:  Chesley Mires, MD [place] 06/29/2013, 2:21 PM Pager:  [phone] After 3pm call: [phone]

## 2013-06-29 NOTE — Assessment & Plan Note (Signed)
She continues to have exacerbation.  She is to continue course of amoxicillin and medrol.  She is to continue symbicort and albuterol.  Will arrange for new nebulizer machine.

## 2013-07-07 DIAGNOSIS — Z85820 Personal history of malignant melanoma of skin: Secondary | ICD-10-CM | POA: Diagnosis not present

## 2013-07-07 DIAGNOSIS — Z885 Allergy status to narcotic agent status: Secondary | ICD-10-CM | POA: Diagnosis not present

## 2013-07-07 DIAGNOSIS — R928 Other abnormal and inconclusive findings on diagnostic imaging of breast: Secondary | ICD-10-CM | POA: Diagnosis not present

## 2013-07-07 DIAGNOSIS — Z853 Personal history of malignant neoplasm of breast: Secondary | ICD-10-CM | POA: Diagnosis not present

## 2013-07-16 DIAGNOSIS — R928 Other abnormal and inconclusive findings on diagnostic imaging of breast: Secondary | ICD-10-CM | POA: Diagnosis not present

## 2013-07-16 DIAGNOSIS — N6039 Fibrosclerosis of unspecified breast: Secondary | ICD-10-CM | POA: Diagnosis not present

## 2013-07-16 DIAGNOSIS — N6489 Other specified disorders of breast: Secondary | ICD-10-CM | POA: Diagnosis not present

## 2013-07-16 DIAGNOSIS — N63 Unspecified lump in unspecified breast: Secondary | ICD-10-CM | POA: Diagnosis not present

## 2013-07-27 DIAGNOSIS — H00019 Hordeolum externum unspecified eye, unspecified eyelid: Secondary | ICD-10-CM | POA: Diagnosis not present

## 2013-08-04 DIAGNOSIS — E039 Hypothyroidism, unspecified: Secondary | ICD-10-CM | POA: Diagnosis not present

## 2013-08-14 ENCOUNTER — Telehealth: Payer: Self-pay | Admitting: Pulmonary Disease

## 2013-08-14 MED ORDER — BUDESONIDE-FORMOTEROL FUMARATE 160-4.5 MCG/ACT IN AERO: INHALATION_SPRAY | RESPIRATORY_TRACT | Status: DC

## 2013-08-14 NOTE — Telephone Encounter (Signed)
Called and spoke with pt and she was a little upset that the refill of the symbicort keeps getting sent in for a 30 day supply and this is cheaper for her to pay for the 90 day supply.  Pt is aware that this has been sent in to her pharmacy for 90 day supply.  i have attached a note to the symbicort and proair that the pt would like these meds filled for 90 supply all the time.

## 2013-10-05 ENCOUNTER — Encounter: Payer: Self-pay | Admitting: Cardiology

## 2013-10-09 DIAGNOSIS — N183 Chronic kidney disease, stage 3 unspecified: Secondary | ICD-10-CM | POA: Diagnosis not present

## 2013-10-09 DIAGNOSIS — E039 Hypothyroidism, unspecified: Secondary | ICD-10-CM | POA: Diagnosis not present

## 2013-10-09 DIAGNOSIS — Z1331 Encounter for screening for depression: Secondary | ICD-10-CM | POA: Diagnosis not present

## 2013-10-09 DIAGNOSIS — Z23 Encounter for immunization: Secondary | ICD-10-CM | POA: Diagnosis not present

## 2013-10-09 DIAGNOSIS — J45909 Unspecified asthma, uncomplicated: Secondary | ICD-10-CM | POA: Diagnosis not present

## 2013-10-09 DIAGNOSIS — K219 Gastro-esophageal reflux disease without esophagitis: Secondary | ICD-10-CM | POA: Diagnosis not present

## 2013-10-22 DIAGNOSIS — L570 Actinic keratosis: Secondary | ICD-10-CM | POA: Diagnosis not present

## 2013-10-22 DIAGNOSIS — L259 Unspecified contact dermatitis, unspecified cause: Secondary | ICD-10-CM | POA: Diagnosis not present

## 2013-11-10 DIAGNOSIS — R350 Frequency of micturition: Secondary | ICD-10-CM | POA: Diagnosis not present

## 2013-11-10 DIAGNOSIS — N390 Urinary tract infection, site not specified: Secondary | ICD-10-CM | POA: Diagnosis not present

## 2013-11-10 DIAGNOSIS — Z01419 Encounter for gynecological examination (general) (routine) without abnormal findings: Secondary | ICD-10-CM | POA: Diagnosis not present

## 2013-11-10 DIAGNOSIS — N760 Acute vaginitis: Secondary | ICD-10-CM | POA: Diagnosis not present

## 2013-11-27 ENCOUNTER — Ambulatory Visit: Payer: Medicare Other | Admitting: Pulmonary Disease

## 2014-01-08 ENCOUNTER — Ambulatory Visit (INDEPENDENT_AMBULATORY_CARE_PROVIDER_SITE_OTHER): Payer: Medicare Other | Admitting: Pulmonary Disease

## 2014-01-08 ENCOUNTER — Encounter: Payer: Self-pay | Admitting: Pulmonary Disease

## 2014-01-08 ENCOUNTER — Encounter (INDEPENDENT_AMBULATORY_CARE_PROVIDER_SITE_OTHER): Payer: Self-pay

## 2014-01-08 VITALS — BP 122/80 | HR 79 | Temp 97.8°F | Ht 64.25 in | Wt 170.0 lb

## 2014-01-08 DIAGNOSIS — J449 Chronic obstructive pulmonary disease, unspecified: Secondary | ICD-10-CM | POA: Diagnosis not present

## 2014-01-08 DIAGNOSIS — R058 Other specified cough: Secondary | ICD-10-CM

## 2014-01-08 DIAGNOSIS — H547 Unspecified visual loss: Secondary | ICD-10-CM

## 2014-01-08 DIAGNOSIS — R05 Cough: Secondary | ICD-10-CM

## 2014-01-08 MED ORDER — BUDESONIDE-FORMOTEROL FUMARATE 160-4.5 MCG/ACT IN AERO: 2.0000 | INHALATION_SPRAY | Freq: Two times a day (BID) | RESPIRATORY_TRACT | Status: DC

## 2014-01-08 MED ORDER — BUDESONIDE-FORMOTEROL FUMARATE 160-4.5 MCG/ACT IN AERO
2.0000 | INHALATION_SPRAY | Freq: Two times a day (BID) | RESPIRATORY_TRACT | Status: DC
Start: 2014-01-08 — End: 2014-01-08

## 2014-01-08 NOTE — Patient Instructions (Signed)
Follow up in 4 months 

## 2014-01-08 NOTE — Progress Notes (Signed)
Chief Complaint  Patient presents with  . Follow-up    Pt reports that she has been doing well since last OV. No complaints.     History of Present Illness: Sonia [name] is a 73 y.o. 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  She also has hx of hiatal hernia and GERD.  She has been doing well.  She is not having cough, wheeze, or sputum.  She has felt more sinus congestion/pressure since yesterday.  She is not having fever.  She has noticed seeing "spider webs" when she gets a headache, and had an episode last month when she felt like she saw double in her right eye >> this resolved spontaneously.  Tests: PFT 04/15/08 >> FEV1 1.63 (76%), FEV1% 47, TLC 5.68(113%), DLCO 87%, no BD PFT 07/30/11 >> FEV1 1.25 (61%), FEV1% 47, TLC 4.79 (96%), DLCO 94%, no BD  PMHx, PSHx, Medications, Allergies, Fhx, Shx reviewed.  Physical Exam:  General - no distress HEENT - no sinus tenderness, no oral exudate, no LAN Cardiac - s1s2 regular, no murmur  Chest - no wheeze/rales Abd - soft, non-tender Ext - no edema Neuro - normal strength Psych - normal mood/behavior   Assessment/Plan:  Sonia Mires, MD [place] 01/08/2014, 9:42 AM Pager:  [phone] After 3pm call: [phone]

## 2014-01-08 NOTE — Assessment & Plan Note (Signed)
Improved since last visit

## 2014-01-08 NOTE — Assessment & Plan Note (Signed)
Stable on current regimen   

## 2014-01-08 NOTE — Assessment & Plan Note (Signed)
Advised her to f/u with PCP and eye doctor.

## 2014-02-16 ENCOUNTER — Telehealth: Payer: Self-pay | Admitting: Pulmonary Disease

## 2014-02-16 MED ORDER — AMOXICILLIN 500 MG PO TABS: 500.0000 mg | ORAL_TABLET | Freq: Two times a day (BID) | ORAL | Status: DC

## 2014-02-16 NOTE — Telephone Encounter (Signed)
Send script for amoxicillin 500 mg bid, dispense 20 pills with no refills.  Advise her to call back and schedule ROV if not improved.

## 2014-02-16 NOTE — Telephone Encounter (Signed)
Called and spoke to pt. Pt stated she has a sinus infection. Pt c/o ear and teeth aches, stuffy nose, fatigue, headache, nausea and chills. Pt has not taken temperature, unsure if febrile. Pt stated she has been using the neil med nasal rinses that is bringing out green and yellow mucus. Pt stated she just started using nasonex today. Pt requesting amoxicillin for 10 days. Pt last seen by VS on 01/08/14.  Dr. Halford Chessman please advise.   Allergies  Allergen Reactions  . Adenosine   . Codeine Sulfate

## 2014-02-16 NOTE — Telephone Encounter (Signed)
Called and informed pt of the recs per VS. Rx sent. Pt verbalized understanding and denied any further questions or concerns at this time.

## 2014-04-28 DIAGNOSIS — D239 Other benign neoplasm of skin, unspecified: Secondary | ICD-10-CM | POA: Diagnosis not present

## 2014-04-28 DIAGNOSIS — Z85820 Personal history of malignant melanoma of skin: Secondary | ICD-10-CM | POA: Diagnosis not present

## 2014-04-29 DIAGNOSIS — E063 Autoimmune thyroiditis: Secondary | ICD-10-CM | POA: Diagnosis not present

## 2014-04-29 DIAGNOSIS — E039 Hypothyroidism, unspecified: Secondary | ICD-10-CM | POA: Diagnosis not present

## 2014-04-29 DIAGNOSIS — E162 Hypoglycemia, unspecified: Secondary | ICD-10-CM | POA: Diagnosis not present

## 2014-07-07 DIAGNOSIS — H5213 Myopia, bilateral: Secondary | ICD-10-CM | POA: Diagnosis not present

## 2014-07-07 DIAGNOSIS — H2513 Age-related nuclear cataract, bilateral: Secondary | ICD-10-CM | POA: Diagnosis not present

## 2014-07-07 DIAGNOSIS — H532 Diplopia: Secondary | ICD-10-CM | POA: Diagnosis not present

## 2014-08-30 ENCOUNTER — Other Ambulatory Visit: Payer: Self-pay | Admitting: Pulmonary Disease

## 2014-08-30 NOTE — Telephone Encounter (Signed)
Patient needs f/u before future refills, last seen in Oct 2015 needed 4 mos f/u.

## 2014-10-11 DIAGNOSIS — N183 Chronic kidney disease, stage 3 (moderate): Secondary | ICD-10-CM | POA: Diagnosis not present

## 2014-10-11 DIAGNOSIS — H539 Unspecified visual disturbance: Secondary | ICD-10-CM | POA: Diagnosis not present

## 2014-10-11 DIAGNOSIS — Z1389 Encounter for screening for other disorder: Secondary | ICD-10-CM | POA: Diagnosis not present

## 2014-10-11 DIAGNOSIS — Z0000 Encounter for general adult medical examination without abnormal findings: Secondary | ICD-10-CM | POA: Diagnosis not present

## 2014-10-22 DIAGNOSIS — R1013 Epigastric pain: Secondary | ICD-10-CM | POA: Diagnosis not present

## 2014-12-01 ENCOUNTER — Other Ambulatory Visit: Payer: Self-pay | Admitting: Pulmonary Disease

## 2014-12-03 ENCOUNTER — Other Ambulatory Visit: Payer: Self-pay | Admitting: Pulmonary Disease

## 2014-12-03 ENCOUNTER — Telehealth: Payer: Self-pay | Admitting: Pulmonary Disease

## 2014-12-03 MED ORDER — BUDESONIDE-FORMOTEROL FUMARATE 160-4.5 MCG/ACT IN AERO: 2.0000 | INHALATION_SPRAY | Freq: Two times a day (BID) | RESPIRATORY_TRACT | Status: DC

## 2014-12-03 NOTE — Telephone Encounter (Signed)
lmtcb x1 for pt She is overdue for follow up with VS

## 2014-12-03 NOTE — Telephone Encounter (Addendum)
Patient called to get refill on Symbicort.   Patient leaving on a trip between September 30 - October 11th.   Patient wanted to schedule a follow appointment, but there are no appointments on the schedule.  Dr. Halford Chessman, please give me some dates that I can add patient on your schedule after October 11th.

## 2014-12-03 NOTE — Telephone Encounter (Signed)
Pt returned call  [phone]

## 2014-12-03 NOTE — Telephone Encounter (Signed)
Patient scheduled to see Dr. Halford Chessman on 12/24/14 - Per VS, ok to double book. Patient notified. Symbicort sent to pharmacy. Nothing further needed.

## 2014-12-03 NOTE — Telephone Encounter (Signed)
You can schedule her for any date that I am in the office.  Okay to double/triple book.  Okay to send refill for symbicort.

## 2014-12-03 NOTE — Telephone Encounter (Signed)
Received paper refill from [facility] on [address], [place] for Symbicort 160-4.5 Inhale two puffs by mouth twice a daily  X 5 refill Approved per VS Sent e-perscribe Nothing further needed

## 2014-12-13 DIAGNOSIS — M545 Low back pain: Secondary | ICD-10-CM | POA: Diagnosis not present

## 2014-12-24 ENCOUNTER — Encounter: Payer: Self-pay | Admitting: Pulmonary Disease

## 2014-12-24 ENCOUNTER — Ambulatory Visit (INDEPENDENT_AMBULATORY_CARE_PROVIDER_SITE_OTHER): Payer: Medicare Other | Admitting: Pulmonary Disease

## 2014-12-24 VITALS — BP 100/58 | HR 90 | Temp 97.7°F | Ht 64.75 in | Wt 157.4 lb

## 2014-12-24 DIAGNOSIS — R05 Cough: Secondary | ICD-10-CM | POA: Diagnosis not present

## 2014-12-24 DIAGNOSIS — J4489 Other specified chronic obstructive pulmonary disease: Secondary | ICD-10-CM

## 2014-12-24 DIAGNOSIS — J45909 Unspecified asthma, uncomplicated: Secondary | ICD-10-CM

## 2014-12-24 DIAGNOSIS — J449 Chronic obstructive pulmonary disease, unspecified: Secondary | ICD-10-CM | POA: Diagnosis not present

## 2014-12-24 DIAGNOSIS — R058 Other specified cough: Secondary | ICD-10-CM

## 2014-12-24 DIAGNOSIS — Z23 Encounter for immunization: Secondary | ICD-10-CM | POA: Diagnosis not present

## 2014-12-24 NOTE — Progress Notes (Signed)
Chief Complaint  Patient presents with  . Follow-up    COPD follow up - reports breathing is doing so far.  no new complaints.    History of Present Illness: Sonia [name] is a 74 y.o. 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  She has been doing well.  She didn't have much trouble with allergies this past Spring.  She is not having cough, wheeze, or phlegm.  She has lost about 10 lbs and this has helped her breathing.  She uses nasal irrigation and nasonex as needed.  She got her flu shot yesterday.   Tests: PFT 04/15/08 >> FEV1 1.63 (76%), FEV1% 47, TLC 5.68(113%), DLCO 87%, no BD PFT 07/30/11 >> FEV1 1.25 (61%), FEV1% 47, TLC 4.79 (96%), DLCO 94%, no BD  PMHx >> HLD, Vertigo, GERD, HH, Melanoma, Hypothyroidism  PSHx, Medications, Allergies, Fhx, Shx reviewed.  Physical Exam: BP 100/58 mmHg  Pulse 90  Temp(Src) 97.7 F (36.5 C) (Oral)  Ht 5' 4.75" (1.645 m)  Wt 157 lb 6.4 oz (71.396 kg)  BMI 26.38 kg/m2  SpO2 98%  General - no distress HEENT - no sinus tenderness, no oral exudate, no LAN Cardiac - s1s2 regular, no murmur  Chest - no wheeze/rales Abd - soft, non-tender Ext - no edema Neuro - normal strength Psych - normal mood/behavior   Assessment/Plan:  COPD with asthma. Plan: - continue symbicort with prn albuterol >> she gets 3 months supplies   Upper airway cough syndrome. Plan: - continue nasal irrigation and nasonex   Chesley Mires, MD [place] 12/24/2014, 9:22 AM Pager:  [phone]

## 2014-12-24 NOTE — Patient Instructions (Signed)
Follow up in 1 year.

## 2014-12-27 DIAGNOSIS — Z124 Encounter for screening for malignant neoplasm of cervix: Secondary | ICD-10-CM | POA: Diagnosis not present

## 2014-12-27 DIAGNOSIS — Z1272 Encounter for screening for malignant neoplasm of vagina: Secondary | ICD-10-CM | POA: Diagnosis not present

## 2014-12-27 DIAGNOSIS — Z90710 Acquired absence of both cervix and uterus: Secondary | ICD-10-CM | POA: Diagnosis not present

## 2014-12-27 DIAGNOSIS — Z6826 Body mass index (BMI) 26.0-26.9, adult: Secondary | ICD-10-CM | POA: Diagnosis not present

## 2015-01-25 ENCOUNTER — Other Ambulatory Visit: Payer: Self-pay

## 2015-01-25 ENCOUNTER — Other Ambulatory Visit: Payer: Self-pay | Admitting: Internal Medicine

## 2015-01-25 DIAGNOSIS — N63 Unspecified lump in unspecified breast: Secondary | ICD-10-CM

## 2015-01-25 DIAGNOSIS — N6489 Other specified disorders of breast: Secondary | ICD-10-CM

## 2015-01-30 ENCOUNTER — Other Ambulatory Visit: Payer: Self-pay | Admitting: Pulmonary Disease

## 2015-02-01 ENCOUNTER — Ambulatory Visit
Admission: RE | Admit: 2015-02-01 | Discharge: 2015-02-01 | Disposition: A | Discharge status: Home / Self Care | Payer: Medicare Other | Source: Ambulatory Visit | Attending: Internal Medicine | Admitting: Internal Medicine

## 2015-02-01 ENCOUNTER — Ambulatory Visit
Admission: RE | Admit: 2015-02-01 | Discharge: 2015-02-01 | Disposition: A | Discharge status: Home / Self Care | Payer: Medicare Other | Source: Ambulatory Visit

## 2015-02-01 ENCOUNTER — Other Ambulatory Visit: Payer: Self-pay | Admitting: Internal Medicine

## 2015-02-01 DIAGNOSIS — N6489 Other specified disorders of breast: Secondary | ICD-10-CM

## 2015-02-01 DIAGNOSIS — R928 Other abnormal and inconclusive findings on diagnostic imaging of breast: Secondary | ICD-10-CM | POA: Diagnosis not present

## 2015-02-09 ENCOUNTER — Ambulatory Visit
Admission: RE | Admit: 2015-02-09 | Discharge: 2015-02-09 | Disposition: A | Discharge status: Home / Self Care | Payer: Medicare Other | Source: Ambulatory Visit | Attending: Internal Medicine | Admitting: Internal Medicine

## 2015-02-09 ENCOUNTER — Other Ambulatory Visit: Payer: Self-pay | Admitting: Internal Medicine

## 2015-02-09 ENCOUNTER — Telehealth: Payer: Self-pay | Admitting: Pulmonary Disease

## 2015-02-09 DIAGNOSIS — N6489 Other specified disorders of breast: Secondary | ICD-10-CM | POA: Diagnosis not present

## 2015-02-09 DIAGNOSIS — R928 Other abnormal and inconclusive findings on diagnostic imaging of breast: Secondary | ICD-10-CM | POA: Diagnosis not present

## 2015-02-09 NOTE — Telephone Encounter (Signed)
Called and spoke to pt. Pt requesting Symbicort 160 sample. Informed her we are currently out of Symbicort 160 samples and informed her to call back early next week to see if we have any samples then because pt is in the donut hole. Nothing further needed at this time.

## 2015-02-17 ENCOUNTER — Telehealth: Payer: Self-pay | Admitting: Pulmonary Disease

## 2015-02-17 NOTE — Telephone Encounter (Signed)
Samples have been placed at the front desk for pick up. Pt is aware. Nothing further was needed.

## 2015-02-25 ENCOUNTER — Ambulatory Visit: Payer: Self-pay | Admitting: Surgery

## 2015-02-25 DIAGNOSIS — N63 Unspecified lump in breast: Secondary | ICD-10-CM | POA: Diagnosis not present

## 2015-02-25 DIAGNOSIS — N6320 Unspecified lump in the left breast, unspecified quadrant: Secondary | ICD-10-CM

## 2015-02-25 NOTE — H&P (Signed)
Sonia [name] 02/25/2015 10:39 AM Location: [facility] Patient #: [id number] DOB: January 05, 1941 Divorced / Language: Sonia [name] / Race: White Female  History of Present Illness Sonia Moores A. Zyler Hyson [name]; 02/25/2015 11:10 AM) Patient words: breast eval   Patient stands request Dr. Luberta [name] for left breast mass. She had a biopsy at [facility] depression or [facility] of her left breast using stereotactic technology. Follow results showed complex sclerosing lesion. She is here today to discuss options for treatment of this. Patient denies any problems with her breast except some fullness of her left breast. Her left breast is more ptotic than her right. Denies any redness or nipple discharge. Has some mild discomfort in the breast from time to time.                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 Excision is recommended. Pathology was discussed with the patient by telephone. She reported doing well after the biopsy. Post biopsy instructions and care were reviewed and her questions were answered. She reported tenderness at the site and throughout the breast after the biopsy. Post biopsy instructions and care were reviewed and her questions were answered. Surgical consultation has been arranged with Dr. Erroll [name] at [facility] on February 25, 2015. My number was provided for additional questions and concerns. Pathology results reported by Sonia Raring RN, BSN on February 10, 2015. Electronically Signed By: Sonia [name] M.D. On: 02/10/2015 10:15               Breast, left, needle core biopsy, upper outer quadrant - FEATURES SUGGESTIVE OF COMPLEX SCLEROSING LESION WITH CALCIFICATIONS. - SEE COMMENT.  The patient is a 74 year old female.   Other Problems Sonia [name], CMA; 02/25/2015  10:39 AM) Arthritis Asthma Gastroesophageal Reflux Disease Hypercholesterolemia Lump In Breast Melanoma Migraine Headache Oophorectomy Right. Thyroid Disease  Past Surgical History Sonia [name], CMA; 02/25/2015 10:39 AM) Appendectomy Colon Polyp Removal - Colonoscopy Hysterectomy (not due to cancer) - Complete Hysterectomy (not due to cancer) - Partial Knee Surgery Right. Oral Surgery  Diagnostic Studies History Sonia [name], CMA; 02/25/2015 10:39 AM) Colonoscopy 1-5 years ago Mammogram within last year  Allergies Sonia [name], CMA; 02/25/2015 10:40 AM) Adenosine (Diagnostic) *DIAGNOSTIC PRODUCTS* Codeine Phosphate *ANALGESICS - OPIOID*  Medication History (Sonia [name], CMA; 02/25/2015 10:40 AM) Synthroid (137MCG Tablet, Oral) Active. ProAir HFA (108 (90 Base)MCG/ACT Aerosol Soln, Inhalation) Active. Synthroid (125MCG Tablet, Oral) Active. Symbicort (160-4.5MCG/ACT Aerosol, Inhalation) Active. Medications Reconciled  Social History Sonia [name], CMA; 02/25/2015 10:39 AM) Alcohol use Occasional alcohol use. Tobacco use Never smoker.  Family History Sonia [name]; 02/25/2015 10:39 AM) Arthritis Sister. Breast Cancer Sister. Cancer Sister. Heart Disease Brother, Mother, Sister. Heart disease in female family member before age 78 Hypertension Son. Kidney Disease Father. Prostate Cancer Brother. Respiratory Condition Sister. Thyroid problems Sister.  Pregnancy / Birth History Sonia [name], Sonia [name]; 02/25/2015 10:39 AM) Age at menarche 87 years. Age of menopause 45-55 Gravida 2 Irregular periods Maternal age 57-30 Para 2     Review of Systems (Sonia [name]; 02/25/2015 10:39 AM) General Not Present- Appetite Loss, Chills, Fatigue, Fever, Night Sweats, Weight Gain and Weight Loss. Skin Present- Dryness and New Lesions. Not Present- Change in Wart/Mole, Hives, Jaundice, Non-Healing Wounds, Rash and Ulcer. HEENT Not  Present- Earache, Hearing Loss, Hoarseness, Nose Bleed, Oral Ulcers, Ringing in the Ears, Seasonal Allergies, Sinus Pain, Sore Throat, Visual Disturbances, Wears  glasses/contact lenses and Yellow Eyes. Respiratory Not Present- Bloody sputum, Chronic Cough, Difficulty Breathing, Snoring and Wheezing. Breast Not Present- Breast Mass, Breast Pain, Nipple Discharge and Skin Changes. Gastrointestinal Present- Constipation. Not Present- Abdominal Pain, Bloating, Bloody Stool, Change in Bowel Habits, Chronic diarrhea, Difficulty Swallowing, Excessive gas, Gets full quickly at meals, Hemorrhoids, Indigestion, Nausea, Rectal Pain and Vomiting. Female Genitourinary Present- Frequency. Not Present- Nocturia, Painful Urination, Pelvic Pain and Urgency.  Vitals (Sonia [name] CMA; 02/25/2015 10:40 AM) 02/25/2015 10:40 AM Weight: 156 lb Height: 64in Body Surface Area: 1.76 m Body Mass Index: 26.78 kg/m  Temp.: 97.32F(Temporal)  Pulse: 76 (Regular)  BP: 126/80 (Sitting, Left Arm, Standard)      Physical Exam (Sonia [name]; 02/25/2015 11:11 AM)  General Mental Status-Alert. General Appearance-Consistent with stated age. Hydration-Well hydrated. Voice-Normal.  Eye Eyeball - Bilateral-Extraocular movements intact. Sclera/Conjunctiva - Bilateral-No scleral icterus.  Breast Breast - Left-Symmetric, Non Tender, No Biopsy scars, no Dimpling, No Inflammation, No Lumpectomy scars, No Mastectomy scars, No Peau d' Orange. Breast - Right-Symmetric, Non Tender, No Biopsy scars, no Dimpling, No Inflammation, No Lumpectomy scars, No Mastectomy scars, No Peau d' Orange. Breast Lump-No Palpable Breast Mass.  Cardiovascular Cardiovascular examination reveals -normal heart sounds, regular rate and rhythm with no murmurs and normal pedal pulses bilaterally.  Musculoskeletal Normal Exam - Left-Upper Extremity Strength Normal and Lower Extremity Strength Normal. Normal  Exam - Right-Upper Extremity Strength Normal and Lower Extremity Strength Normal.  Lymphatic Axillary  General Axillary Region: Bilateral - Description - Normal. Tenderness - Non Tender.    Assessment & Plan (Sonia Landen A. Geoffrey Mankin [name]; 02/25/2015 11:11 AM)  LEFT BREAST MASS (N63) Impression: discussed rationelle for excision and small risk of malignancy with this finding. Observation discussed as well. Risk of lumpectomy include bleeding, infection, seroma, more surgery, use of seed/wire, wound care, cosmetic deformity and the need for other treatments, death , blood clots, death. Pt agrees to proceed.  Current Plans Pt Education - [facility] Breast Biopsy HCI: discussed with patient and provided information. The anatomy and the physiology was discussed. The pathophysiology and natural history of the disease was discussed. Options were discussed and recommendations were made. Technique, risks, benefits, & alternatives were discussed. Risks such as stroke, heart attack, bleeding, indection, death, and other risks discussed. Questions answered. The patient agrees to proceed.

## 2015-03-01 ENCOUNTER — Other Ambulatory Visit: Payer: Self-pay | Admitting: Surgery

## 2015-03-01 DIAGNOSIS — N6320 Unspecified lump in the left breast, unspecified quadrant: Secondary | ICD-10-CM

## 2015-03-13 DIAGNOSIS — N6022 Fibroadenosis of left breast: Secondary | ICD-10-CM

## 2015-03-13 HISTORY — PX: BREAST EXCISIONAL BIOPSY: SUR124

## 2015-03-13 HISTORY — DX: Fibroadenosis of left breast: N60.22

## 2015-03-24 ENCOUNTER — Telehealth: Payer: Self-pay | Admitting: Pulmonary Disease

## 2015-03-24 MED ORDER — ALBUTEROL SULFATE HFA 108 (90 BASE) MCG/ACT IN AERS: INHALATION_SPRAY | RESPIRATORY_TRACT | Status: DC

## 2015-03-24 MED ORDER — BUDESONIDE-FORMOTEROL FUMARATE 160-4.5 MCG/ACT IN AERO: 2.0000 | INHALATION_SPRAY | Freq: Two times a day (BID) | RESPIRATORY_TRACT | Status: DC

## 2015-03-24 NOTE — Telephone Encounter (Signed)
Called spoke with pt. She reports things are fine with breathing. Nothing further needed

## 2015-03-24 NOTE — Telephone Encounter (Signed)
Called spoke with pt. She needed refill on symbicort and proair sent to [facility]. I have done so. She also wants to know if Dr. Halford Chessman needs to see her prior to having her breast lumpectomy on 04/07/15 to have a tumor removed. Please advise thanks

## 2015-03-24 NOTE — Telephone Encounter (Signed)
If her breathing symptoms are okay, then she does not need visit prior to having lumpectomy.

## 2015-03-31 ENCOUNTER — Encounter (HOSPITAL_BASED_OUTPATIENT_CLINIC_OR_DEPARTMENT_OTHER): Payer: Self-pay | Admitting: *Deleted

## 2015-04-04 ENCOUNTER — Ambulatory Visit
Admission: RE | Admit: 2015-04-04 | Discharge: 2015-04-04 | Disposition: A | Discharge status: Home / Self Care | Payer: Medicare Other | Source: Ambulatory Visit | Attending: Surgery | Admitting: Surgery

## 2015-04-04 DIAGNOSIS — R928 Other abnormal and inconclusive findings on diagnostic imaging of breast: Secondary | ICD-10-CM | POA: Diagnosis not present

## 2015-04-04 DIAGNOSIS — N6320 Unspecified lump in the left breast, unspecified quadrant: Secondary | ICD-10-CM

## 2015-04-07 ENCOUNTER — Ambulatory Visit
Admission: RE | Admit: 2015-04-07 | Discharge: 2015-04-07 | Disposition: A | Discharge status: Home / Self Care | Payer: Medicare Other | Source: Ambulatory Visit | Attending: Surgery | Admitting: Surgery

## 2015-04-07 ENCOUNTER — Ambulatory Visit (HOSPITAL_BASED_OUTPATIENT_CLINIC_OR_DEPARTMENT_OTHER): Payer: Medicare Other | Admitting: Anesthesiology

## 2015-04-07 ENCOUNTER — Ambulatory Visit (HOSPITAL_BASED_OUTPATIENT_CLINIC_OR_DEPARTMENT_OTHER)
Admission: RE | Admit: 2015-04-07 | Discharge: 2015-04-07 | Disposition: A | Discharge status: Home / Self Care | Payer: Medicare Other | Source: Ambulatory Visit | Attending: Surgery | Admitting: Surgery

## 2015-04-07 ENCOUNTER — Encounter (HOSPITAL_BASED_OUTPATIENT_CLINIC_OR_DEPARTMENT_OTHER)
Admission: RE | Disposition: A | Discharge status: Home / Self Care | Payer: Self-pay | Source: Ambulatory Visit | Attending: Surgery

## 2015-04-07 ENCOUNTER — Encounter (HOSPITAL_BASED_OUTPATIENT_CLINIC_OR_DEPARTMENT_OTHER): Payer: Self-pay

## 2015-04-07 DIAGNOSIS — R928 Other abnormal and inconclusive findings on diagnostic imaging of breast: Secondary | ICD-10-CM | POA: Diagnosis not present

## 2015-04-07 DIAGNOSIS — E063 Autoimmune thyroiditis: Secondary | ICD-10-CM | POA: Diagnosis not present

## 2015-04-07 DIAGNOSIS — N6320 Unspecified lump in the left breast, unspecified quadrant: Secondary | ICD-10-CM

## 2015-04-07 DIAGNOSIS — J45909 Unspecified asthma, uncomplicated: Secondary | ICD-10-CM | POA: Diagnosis not present

## 2015-04-07 DIAGNOSIS — E039 Hypothyroidism, unspecified: Secondary | ICD-10-CM | POA: Insufficient documentation

## 2015-04-07 DIAGNOSIS — N6489 Other specified disorders of breast: Secondary | ICD-10-CM | POA: Insufficient documentation

## 2015-04-07 DIAGNOSIS — D4862 Neoplasm of uncertain behavior of left breast: Secondary | ICD-10-CM | POA: Diagnosis not present

## 2015-04-07 DIAGNOSIS — N6092 Unspecified benign mammary dysplasia of left breast: Secondary | ICD-10-CM | POA: Diagnosis not present

## 2015-04-07 DIAGNOSIS — K219 Gastro-esophageal reflux disease without esophagitis: Secondary | ICD-10-CM | POA: Insufficient documentation

## 2015-04-07 DIAGNOSIS — M1990 Unspecified osteoarthritis, unspecified site: Secondary | ICD-10-CM | POA: Diagnosis not present

## 2015-04-07 DIAGNOSIS — J449 Chronic obstructive pulmonary disease, unspecified: Secondary | ICD-10-CM | POA: Insufficient documentation

## 2015-04-07 DIAGNOSIS — N6012 Diffuse cystic mastopathy of left breast: Secondary | ICD-10-CM | POA: Diagnosis not present

## 2015-04-07 HISTORY — DX: Headache: R51

## 2015-04-07 HISTORY — PX: BREAST LUMPECTOMY WITH RADIOACTIVE SEED LOCALIZATION: SHX6424

## 2015-04-07 HISTORY — DX: Autoimmune thyroiditis: E06.3

## 2015-04-07 HISTORY — DX: Headache, unspecified: R51.9

## 2015-04-07 HISTORY — DX: Fibroadenosis of left breast: N60.22

## 2015-04-07 HISTORY — DX: Hypothyroidism, unspecified: E03.9

## 2015-04-07 SURGERY — BREAST LUMPECTOMY WITH RADIOACTIVE SEED LOCALIZATION
Anesthesia: General | Site: Breast | Laterality: Left

## 2015-04-07 MED ORDER — MIDAZOLAM HCL 2 MG/2ML IJ SOLN: 1.0000 mg | INTRAMUSCULAR | Status: DC | PRN

## 2015-04-07 MED ORDER — GLYCOPYRROLATE 0.2 MG/ML IJ SOLN
0.2000 mg | Freq: Once | INTRAMUSCULAR | Status: AC | PRN
  Administered 2015-04-07: 0.2 mg via INTRAVENOUS

## 2015-04-07 MED ORDER — TRAMADOL HCL 50 MG PO TABS: 50.0000 mg | ORAL_TABLET | Freq: Four times a day (QID) | ORAL | Status: DC | PRN

## 2015-04-07 MED ORDER — HYDROCODONE-ACETAMINOPHEN 7.5-325 MG PO TABS: 1.0000 | ORAL_TABLET | Freq: Once | ORAL | Status: DC | PRN

## 2015-04-07 MED ORDER — PROMETHAZINE HCL 25 MG/ML IJ SOLN: 6.2500 mg | INTRAMUSCULAR | Status: DC | PRN

## 2015-04-07 MED ORDER — FENTANYL CITRATE (PF) 100 MCG/2ML IJ SOLN: 25.0000 ug | INTRAMUSCULAR | Status: DC | PRN

## 2015-04-07 MED ORDER — LIDOCAINE HCL (CARDIAC) 20 MG/ML IV SOLN
INTRAVENOUS | Status: AC
  Filled 2015-04-07: qty 5

## 2015-04-07 MED ORDER — CEFAZOLIN SODIUM-DEXTROSE 2-3 GM-% IV SOLR
INTRAVENOUS | Status: AC
  Filled 2015-04-07: qty 50

## 2015-04-07 MED ORDER — ONDANSETRON HCL 4 MG/2ML IJ SOLN
INTRAMUSCULAR | Status: AC
  Filled 2015-04-07: qty 2

## 2015-04-07 MED ORDER — LIDOCAINE HCL (CARDIAC) 20 MG/ML IV SOLN
INTRAVENOUS | Status: DC | PRN
  Administered 2015-04-07: 80 mg via INTRAVENOUS

## 2015-04-07 MED ORDER — BUPIVACAINE-EPINEPHRINE (PF) 0.25% -1:200000 IJ SOLN
INTRAMUSCULAR | Status: AC
  Filled 2015-04-07: qty 30

## 2015-04-07 MED ORDER — CEFAZOLIN SODIUM-DEXTROSE 2-3 GM-% IV SOLR: 2.0000 g | INTRAVENOUS | Status: DC

## 2015-04-07 MED ORDER — GLYCOPYRROLATE 0.2 MG/ML IJ SOLN
INTRAMUSCULAR | Status: AC
  Filled 2015-04-07: qty 1

## 2015-04-07 MED ORDER — LACTATED RINGERS IV SOLN
INTRAVENOUS | Status: DC
  Administered 2015-04-07: 08:00:00 via INTRAVENOUS

## 2015-04-07 MED ORDER — FENTANYL CITRATE (PF) 100 MCG/2ML IJ SOLN
INTRAMUSCULAR | Status: AC
  Filled 2015-04-07: qty 2

## 2015-04-07 MED ORDER — PROPOFOL 10 MG/ML IV BOLUS
INTRAVENOUS | Status: DC | PRN
  Administered 2015-04-07: 120 mg via INTRAVENOUS
  Administered 2015-04-07: 50 mg via INTRAVENOUS

## 2015-04-07 MED ORDER — CHLORHEXIDINE GLUCONATE 4 % EX LIQD: 1.0000 "application " | Freq: Once | CUTANEOUS | Status: DC

## 2015-04-07 MED ORDER — BUPIVACAINE-EPINEPHRINE (PF) 0.25% -1:200000 IJ SOLN
INTRAMUSCULAR | Status: DC | PRN
  Administered 2015-04-07: 20 mL via PERINEURAL

## 2015-04-07 MED ORDER — FENTANYL CITRATE (PF) 100 MCG/2ML IJ SOLN
50.0000 ug | INTRAMUSCULAR | Status: DC | PRN
  Administered 2015-04-07: 100 ug via INTRAVENOUS

## 2015-04-07 MED ORDER — ACETAMINOPHEN 325 MG PO TABS
ORAL_TABLET | ORAL | Status: AC
  Filled 2015-04-07: qty 1

## 2015-04-07 MED ORDER — MEPERIDINE HCL 25 MG/ML IJ SOLN: 6.2500 mg | INTRAMUSCULAR | Status: DC | PRN

## 2015-04-07 MED ORDER — DEXAMETHASONE SODIUM PHOSPHATE 4 MG/ML IJ SOLN
INTRAMUSCULAR | Status: DC | PRN
  Administered 2015-04-07: 10 mg via INTRAVENOUS

## 2015-04-07 MED ORDER — SCOPOLAMINE 1 MG/3DAYS TD PT72: 1.0000 | MEDICATED_PATCH | Freq: Once | TRANSDERMAL | Status: DC | PRN

## 2015-04-07 MED ORDER — ACETAMINOPHEN 325 MG PO TABS
650.0000 mg | ORAL_TABLET | Freq: Once | ORAL | Status: AC | PRN
  Administered 2015-04-07: 650 mg via ORAL

## 2015-04-07 MED ORDER — DEXAMETHASONE SODIUM PHOSPHATE 10 MG/ML IJ SOLN
INTRAMUSCULAR | Status: AC
  Filled 2015-04-07: qty 1

## 2015-04-07 MED ORDER — ONDANSETRON HCL 4 MG/2ML IJ SOLN
INTRAMUSCULAR | Status: DC | PRN
  Administered 2015-04-07: 4 mg via INTRAVENOUS

## 2015-04-07 SURGICAL SUPPLY — 49 items
APPLIER CLIP 9.375 MED OPEN (MISCELLANEOUS)
BINDER BREAST LRG (GAUZE/BANDAGES/DRESSINGS) ×3 IMPLANT
BINDER BREAST MEDIUM (GAUZE/BANDAGES/DRESSINGS) IMPLANT
BINDER BREAST XLRG (GAUZE/BANDAGES/DRESSINGS) IMPLANT
BINDER BREAST XXLRG (GAUZE/BANDAGES/DRESSINGS) IMPLANT
BLADE SURG 15 STRL LF DISP TIS (BLADE) ×1 IMPLANT
BLADE SURG 15 STRL SS (BLADE) ×2
CANISTER SUC SOCK COL 7IN (MISCELLANEOUS) ×3 IMPLANT
CANISTER SUCT 1200ML W/VALVE (MISCELLANEOUS) IMPLANT
CHLORAPREP W/TINT 26ML (MISCELLANEOUS) ×3 IMPLANT
CLIP APPLIE 9.375 MED OPEN (MISCELLANEOUS) IMPLANT
COVER BACK TABLE 60X90IN (DRAPES) ×3 IMPLANT
COVER MAYO STAND STRL (DRAPES) ×3 IMPLANT
COVER PROBE W GEL 5X96 (DRAPES) ×3 IMPLANT
DECANTER SPIKE VIAL GLASS SM (MISCELLANEOUS) IMPLANT
DEVICE DUBIN W/COMP PLATE 8390 (MISCELLANEOUS) ×3 IMPLANT
DRAPE LAPAROSCOPIC ABDOMINAL (DRAPES) IMPLANT
DRAPE LAPAROTOMY 100X72 PEDS (DRAPES) ×3 IMPLANT
DRAPE UTILITY XL STRL (DRAPES) ×3 IMPLANT
ELECT COATED BLADE 2.86 ST (ELECTRODE) ×3 IMPLANT
ELECT REM PT RETURN 9FT ADLT (ELECTROSURGICAL) ×3
ELECTRODE REM PT RTRN 9FT ADLT (ELECTROSURGICAL) ×1 IMPLANT
GLOVE BIOGEL PI IND STRL 6.5 (GLOVE) ×1 IMPLANT
GLOVE BIOGEL PI IND STRL 8 (GLOVE) IMPLANT
GLOVE BIOGEL PI INDICATOR 6.5 (GLOVE) ×2
GLOVE BIOGEL PI INDICATOR 8 (GLOVE)
GLOVE ECLIPSE 8.0 STRL XLNG CF (GLOVE) IMPLANT
GLOVE SURG SS PI 6.5 STRL IVOR (GLOVE) ×3 IMPLANT
GLOVE SURG SS PI 8.0 STRL IVOR (GLOVE) ×3 IMPLANT
GOWN STRL REUS W/ TWL LRG LVL3 (GOWN DISPOSABLE) ×2 IMPLANT
GOWN STRL REUS W/TWL LRG LVL3 (GOWN DISPOSABLE) ×4
HEMOSTAT SNOW SURGICEL 2X4 (HEMOSTASIS) IMPLANT
KIT MARKER MARGIN INK (KITS) ×3 IMPLANT
LIQUID BAND (GAUZE/BANDAGES/DRESSINGS) ×3 IMPLANT
NEEDLE HYPO 25X1 1.5 SAFETY (NEEDLE) ×3 IMPLANT
NS IRRIG 1000ML POUR BTL (IV SOLUTION) ×3 IMPLANT
PACK BASIN DAY SURGERY FS (CUSTOM PROCEDURE TRAY) ×3 IMPLANT
PENCIL BUTTON HOLSTER BLD 10FT (ELECTRODE) ×3 IMPLANT
SLEEVE SCD COMPRESS KNEE MED (MISCELLANEOUS) ×3 IMPLANT
SPONGE LAP 4X18 X RAY DECT (DISPOSABLE) ×3 IMPLANT
SUT MNCRL AB 4-0 PS2 18 (SUTURE) ×3 IMPLANT
SUT SILK 2 0 SH (SUTURE) IMPLANT
SUT VICRYL 3-0 CR8 SH (SUTURE) ×3 IMPLANT
SYR CONTROL 10ML LL (SYRINGE) ×3 IMPLANT
TOWEL OR 17X24 6PK STRL BLUE (TOWEL DISPOSABLE) ×3 IMPLANT
TOWEL OR NON WOVEN STRL DISP B (DISPOSABLE) ×3 IMPLANT
TUBE CONNECTING 20'X1/4 (TUBING)
TUBE CONNECTING 20X1/4 (TUBING) IMPLANT
YANKAUER SUCT BULB TIP NO VENT (SUCTIONS) IMPLANT

## 2015-04-07 NOTE — Anesthesia Postprocedure Evaluation (Signed)
Anesthesia Post Note  Patient: Sonia [name]  Procedure(s) Performed: Procedure(s) (LRB): BREAST LUMPECTOMY WITH RADIOACTIVE SEED LOCALIZATION (Left)  Patient location during evaluation: PACU Anesthesia Type: General Level of consciousness: awake and alert and oriented Pain management: pain level controlled Vital Signs Assessment: post-procedure vital signs reviewed and stable Respiratory status: spontaneous breathing, nonlabored ventilation and respiratory function stable Cardiovascular status: blood pressure returned to baseline and stable Postop Assessment: no signs of nausea or vomiting Anesthetic complications: no    Last Vitals:  Filed Vitals:   04/07/15 0930 04/07/15 0945  BP: 154/84 109/84  Pulse: 95 88  Temp:    Resp: 16 14    Last Pain:  Filed Vitals:   04/07/15 0950  PainSc: 2                  Mackenzy Eisenberg A.

## 2015-04-07 NOTE — H&P (Signed)
H&P   Sonia [name] (MR# [id number])      H&P Info    Author Note Status Last Update User Last Update Date/Time   Sonia Luna, MD Signed Sonia Luna, MD     H&P    Expand All Collapse All   Sonia [name]  Location: [facility] Patient #: [id number] DOB: [birth year] Divorced / Language: Sonia [name] / Race: White Female  History of Present Illness Sonia Moores A. Arletta Lumadue MD;  Patient words: breast eval   Patient stands request Dr. Luberta [name] for left breast mass. She had a biopsy  of her left breast using stereotactic technology. Follow results showed complex sclerosing lesion. She is here today to discuss options for treatment of this. Patient denies any problems with her breast except some fullness of her left breast. Her left breast is more ptotic than her right. Denies any redness or nipple discharge. Has some mild discomfort in the breast from time to time.                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 Excision is recommended. Pathology was discussed with the patient by telephone. She reported doing well after the biopsy. Post biopsy instructions and care were reviewed and her questions were answered. She reported tenderness at the site and throughout the breast after the biopsy. Post biopsy instructions and care were reviewed and her questions were answered. Surgical consultation has been arranged with Dr. Erroll [name] at [facility] on February 25, 2015. My number was provided for additional questions and concerns. Pathology results reported by Sonia Raring RN, BSN on February 10, 2015. Electronically Signed By: Sonia [name] M.D. On: 02/10/2015 10:15               Breast, left, needle core biopsy, upper outer quadrant - FEATURES SUGGESTIVE OF COMPLEX SCLEROSING LESION  WITH CALCIFICATIONS. - SEE COMMENT.  The patient is a 75 year old female.   Other Problems  Arthritis Asthma Gastroesophageal Reflux Disease Hypercholesterolemia Lump In Breast Melanoma Migraine Headache Oophorectomy Right. Thyroid Disease  Past Surgical History  Appendectomy Colon Polyp Removal - Colonoscopy Hysterectomy (not due to cancer) - Complete Hysterectomy (not due to cancer) - Partial Knee Surgery Right. Oral Surgery  Diagnostic Studies History  Colonoscopy 1-5 years ago Mammogram within last year  Allergies  Adenosine (Diagnostic) *DIAGNOSTIC PRODUCTS* Codeine Phosphate *ANALGESICS - OPIOID*  Medication History  Synthroid (137MCG Tablet, Oral) Active. ProAir HFA (108 (90 Base)MCG/ACT Aerosol Soln, Inhalation) Active. Synthroid (125MCG Tablet, Oral) Active. Symbicort (160-4.5MCG/ACT Aerosol, Inhalation) Active. Medications Reconciled  Social History  Alcohol use Occasional alcohol use. Tobacco use Never smoker.  Family History Arthritis Sister. Breast Cancer Sister. Cancer Sister. Heart Disease Brother, Mother, Sister. Heart disease in female family member before age 36 Hypertension Son. Kidney Disease Father. Prostate Cancer Brother. Respiratory Condition Sister. Thyroid problems Sister.  Pregnancy / Birth History  Age at menarche 59 years. Age of menopause 5-55 Gravida 2 Irregular periods Maternal age 55-30 Para 2     Review of Systems  General Not Present- Appetite Loss, Chills, Fatigue, Fever, Night Sweats, Weight Gain and Weight Loss. Skin Present- Dryness and New Lesions. Not Present- Change in Wart/Mole, Hives, Jaundice, Non-Healing Wounds, Rash and Ulcer. HEENT Not Present- Earache, Hearing Loss, Hoarseness, Nose Bleed, Oral Ulcers, Ringing in the Ears, Seasonal Allergies, Sinus Pain, Sore Throat, Visual Disturbances, Wears glasses/contact lenses and Yellow Eyes. Respiratory Not  Present- Bloody sputum, Chronic Cough, Difficulty Breathing, Snoring and Wheezing. Breast Not Present- Breast Mass, Breast Pain, Nipple Discharge and Skin Changes. Gastrointestinal Present- Constipation. Not Present- Abdominal Pain, Bloating, Bloody Stool, Change in Bowel Habits, Chronic diarrhea, Difficulty Swallowing, Excessive gas, Gets full quickly at meals, Hemorrhoids, Indigestion, Nausea, Rectal Pain and Vomiting. Female Genitourinary Present- Frequency. Not Present- Nocturia, Painful Urination, Pelvic Pain and Urgency.  Vitals  02/25/2015 10:40 AM Weight: 156 lb Height: 64in Body Surface Area: 1.76 m Body Mass Index: 26.78 kg/m  Temp.: 97.67F(Temporal)  Pulse: 76 (Regular)  BP: 126/80 (Sitting, Left Arm, Standard)      Physical Exam   General Mental Status-Alert. General Appearance-Consistent with stated age. Hydration-Well hydrated. Voice-Normal.  Eye Eyeball - Bilateral-Extraocular movements intact. Sclera/Conjunctiva - Bilateral-No scleral icterus.  Breast Breast - Left-Symmetric, Non Tender, No Biopsy scars, no Dimpling, No Inflammation, No Lumpectomy scars, No Mastectomy scars, No Peau d' Orange. Breast - Right-Symmetric, Non Tender, No Biopsy scars, no Dimpling, No Inflammation, No Lumpectomy scars, No Mastectomy scars, No Peau d' Orange. Breast Lump-No Palpable Breast Mass.  Cardiovascular Cardiovascular examination reveals -normal heart sounds, regular rate and rhythm with no murmurs and normal pedal pulses bilaterally.  Musculoskeletal Normal Exam - Left-Upper Extremity Strength Normal and Lower Extremity Strength Normal. Normal Exam - Right-Upper Extremity Strength Normal and Lower Extremity Strength Normal.  Lymphatic Axillary  General Axillary Region: Bilateral - Description - Normal. Tenderness - Non Tender.    Assessment & Plan   LEFT BREAST MASS (N63) Impression: discussed reason for excision and small  risk of malignancy with this finding. Observation discussed as well. Risk of lumpectomy include bleeding, infection, seroma, more surgery, use of seed/wire, wound care, cosmetic deformity and the need for other treatments, death , blood clots, death. Pt agrees to proceed.  Current Plans Pt Education - [facility] Breast Biopsy HCI: discussed with patient and provided information. The anatomy and the physiology was discussed. The pathophysiology and natural history of the disease was discussed. Options were discussed and recommendations were made. Technique, risks, benefits, & alternatives were discussed. Risks such as stroke, heart attack, bleeding, indection, death, and other risks discussed. Questions answered. The patient agrees to proceed.

## 2015-04-07 NOTE — Transfer of Care (Signed)
Immediate Anesthesia Transfer of Care Note  Patient: Sonia [name]  Procedure(s) Performed: Procedure(s): BREAST LUMPECTOMY WITH RADIOACTIVE SEED LOCALIZATION (Left)  Patient Location: PACU  Anesthesia Type:General  Level of Consciousness: awake, sedated and patient cooperative  Airway & Oxygen Therapy: Patient Spontanous Breathing and Patient connected to face mask oxygen  Post-op Assessment: Report given to RN and Post -op Vital signs reviewed and stable  Post vital signs: Reviewed and stable  Last Vitals:  Filed Vitals:   04/07/15 0727  BP: 137/79  Pulse: 71  Temp: 36.5 C  Resp: 18    Complications: No apparent anesthesia complications

## 2015-04-07 NOTE — Anesthesia Preprocedure Evaluation (Signed)
Anesthesia Evaluation  Patient identified by MRN, date of birth, ID band Patient awake    Reviewed: Allergy & Precautions, NPO status , Patient's Chart, lab work & pertinent test results  Airway Mallampati: I       Dental no notable dental hx. (+) Teeth Intact   Pulmonary asthma , COPD,  COPD inhaler,    Pulmonary exam normal breath sounds clear to auscultation       Cardiovascular Normal cardiovascular exam Rhythm:Regular Rate:Normal     Neuro/Psych  Headaches,  Neuromuscular disease    GI/Hepatic hiatal hernia, GERD  Medicated and Controlled,  Endo/Other  Hypothyroidism Hx/o Hashimoto's Thyroiditis  Renal/GU   negative genitourinary   Musculoskeletal  (+) Arthritis , Osteoarthritis,    Abdominal   Peds  Hematology   Anesthesia Other Findings   Reproductive/Obstetrics                             Anesthesia Physical Anesthesia Plan  ASA: II  Anesthesia Plan: General   Post-op Pain Management:    Induction: Intravenous  Airway Management Planned: LMA  Additional Equipment:   Intra-op Plan:   Post-operative Plan: Extubation in OR  Informed Consent: I have reviewed the patients History and Physical, chart, labs and discussed the procedure including the risks, benefits and alternatives for the proposed anesthesia with the patient or authorized representative who has indicated his/her understanding and acceptance.   Dental advisory given  Plan Discussed with: CRNA, Anesthesiologist and Surgeon  Anesthesia Plan Comments:         Anesthesia Quick Evaluation

## 2015-04-07 NOTE — Op Note (Signed)
Preoperative diagnosis: Left breast complex sclerosing lesion  Postoperative diagnosis: Same   Procedure: Left breast seed localized lumpectomy  Surgeon: Erroll Luna M.D.  Anesthesia: Gen. With 0.25% Sensorcaine local with epinephrine   EBL: 20 cc  Specimen: Left breast tissue with clip and radioactive seed in the specimen. Verified with neoprobe and radiographic image showing both seed and clip in specimen  Indications for procedure: The patient presents for left breast  lumpectomy after core biopsy showed complex sclerosing lesion. Discussed the rationale for considering excision. Small risk of malignancy associated with this  lesion after core biopsy. Discussed observation. Discussed wire localization and seed localization. Patient desired excision of left breast CSL .The procedure has been discussed with the patient. Alternatives to surgery have been discussed with the patient.  Risks of surgery include bleeding,  Infection,  Seroma formation, death, cosmetic deformity,   and the need for further surgery.   The patient understands and wishes to proceed.   Description of procedure: Patient underwent seed placement as an outpatient. Patient presents today for left breast seed localized lumpectomy. Patient and holding area. Questions are answered and neoprobe used to verify seed location. Patient taken back to the operating room and placed upon the OR table. After induction of general anesthesia, left breast prepped and draped in a sterile fashion. Timeout was done to verify proper procedure. Neoprobe used and hot spot identified and left breast upper-outer quadrant. This was marked with pen. Curvilinear incision made left upper outer quadrant breast. Dissection used with the help of a neoprobe around the tissue where the seed and clip were located. Tissue removed in its entirety with gross negative  margins.. Neoprobe used and seed and clip  within specimen. Radiographs taken which show clip  and seed  In specimen.Hemostasis achieved and cavity closed with 3-0 Vicryl and 4-0 Monocryl.  Liquid adhesive  applied. All final counts found to be correct. Specimen transported to pathology. Patient awoke extubated taken to recovery in satisfactory condition.

## 2015-04-07 NOTE — Discharge Instructions (Signed)
[facility] °Office Phone Number [phone] ° °BREAST BIOPSY/ PARTIAL MASTECTOMY: POST OP INSTRUCTIONS ° °Always review your discharge instruction sheet given to you by the facility where your surgery was performed. ° °IF YOU HAVE DISABILITY OR FAMILY LEAVE FORMS, YOU MUST BRING THEM TO THE OFFICE FOR PROCESSING.  DO NOT GIVE THEM TO YOUR DOCTOR. ° °1. A prescription for pain medication may be given to you upon discharge.  Take your pain medication as prescribed, if needed.  If narcotic pain medicine is not needed, then you may take acetaminophen (Tylenol) or ibuprofen (Advil) as needed. °2. Take your usually prescribed medications unless otherwise directed °3. If you need a refill on your pain medication, please contact your pharmacy.  They will contact our office to request authorization.  Prescriptions will not be filled after 5pm or on week-ends. °4. You should eat very light the first 24 hours after surgery, such as soup, crackers, pudding, etc.  Resume your normal diet the day after surgery. °5. Most patients will experience some swelling and bruising in the breast.  Ice packs and a good support bra will help.  Swelling and bruising can take several days to resolve.  °6. It is common to experience some constipation if taking pain medication after surgery.  Increasing fluid intake and taking a stool softener will usually help or prevent this problem from occurring.  A mild laxative (Milk of Magnesia or Miralax) should be taken according to package directions if there are no bowel movements after 48 hours. °7. Unless discharge instructions indicate otherwise, you may remove your bandages 24-48 hours after surgery, and you may shower at that time.  You may have steri-strips (small skin tapes) in place directly over the incision.  These strips should be left on the skin for 7-10 days.  If your surgeon used skin glue on the incision, you may shower in 24 hours.  The glue will flake off over the  next 2-3 weeks.  Any sutures or staples will be removed at the office during your follow-up visit. °8. ACTIVITIES:  You may resume regular daily activities (gradually increasing) beginning the next day.  Wearing a good support bra or sports bra minimizes pain and swelling.  You may have sexual intercourse when it is comfortable. °a. You may drive when you no longer are taking prescription pain medication, you can comfortably wear a seatbelt, and you can safely maneuver your car and apply brakes. °b. RETURN TO WORK:  ______________________________________________________________________________________ °9. You should see your doctor in the office for a follow-up appointment approximately two weeks after your surgery.  Your doctor’s nurse will typically make your follow-up appointment when she calls you with your pathology report.  Expect your pathology report 2-3 business days after your surgery.  You may call to check if you do not hear from us after three days. °10. OTHER INSTRUCTIONS: _______________________________________________________________________________________________ _____________________________________________________________________________________________________________________________________ °_____________________________________________________________________________________________________________________________________ °_____________________________________________________________________________________________________________________________________ ° °WHEN TO CALL YOUR DOCTOR: °1. Fever over 101.0 °2. Nausea and/or vomiting. °3. Extreme swelling or bruising. °4. Continued bleeding from incision. °5. Increased pain, redness, or drainage from the incision. ° °The clinic staff is available to answer your questions during regular business hours.  Please don’t hesitate to call and ask to speak to one of the nurses for clinical concerns.  If you have a medical emergency, go to the nearest  emergency room or call 911.  A surgeon from [facility] is always on call at the hospital. ° °For further questions, please visit [email or web address]  ° ° ° °  Post Anesthesia Home Care Instructions ° °Activity: °Get plenty of rest for the remainder of the day. A responsible adult should stay with you for 24 hours following the procedure.  °For the next 24 hours, DO NOT: °-Drive a car °-Operate machinery °-Drink alcoholic beverages °-Take any medication unless instructed by your physician °-Make any legal decisions or sign important papers. ° °Meals: °Start with liquid foods such as gelatin or soup. Progress to regular foods as tolerated. Avoid greasy, spicy, heavy foods. If nausea and/or vomiting occur, drink only clear liquids until the nausea and/or vomiting subsides. Call your physician if vomiting continues. ° °Special Instructions/Symptoms: °Your throat may feel dry or sore from the anesthesia or the breathing tube placed in your throat during surgery. If this causes discomfort, gargle with warm salt water. The discomfort should disappear within 24 hours. ° °If you had a scopolamine patch placed behind your ear for the management of post- operative nausea and/or vomiting: ° °1. The medication in the patch is effective for 72 hours, after which it should be removed.  Wrap patch in a tissue and discard in the trash. Wash hands thoroughly with soap and water. °2. You may remove the patch earlier than 72 hours if you experience unpleasant side effects which may include dry mouth, dizziness or visual disturbances. °3. Avoid touching the patch. Wash your hands with soap and water after contact with the patch. °  ° °

## 2015-04-07 NOTE — Anesthesia Procedure Notes (Signed)
Procedure Name: LMA Insertion Date/Time: 04/07/2015 8:35 AM Performed by: Lyndee Leo Pre-anesthesia Checklist: Patient identified, Emergency Drugs available, Suction available and Patient being monitored Patient Re-evaluated:Patient Re-evaluated prior to inductionOxygen Delivery Method: Circle System Utilized Preoxygenation: Pre-oxygenation with 100% oxygen Intubation Type: IV induction Ventilation: Mask ventilation without difficulty LMA: LMA with gastric port inserted LMA Size: 4.0 Number of attempts: 1 Airway Equipment and Method: Bite block Placement Confirmation: positive ETCO2 Tube secured with: Tape Dental Injury: Teeth and Oropharynx as per pre-operative assessment

## 2015-04-08 ENCOUNTER — Encounter (HOSPITAL_BASED_OUTPATIENT_CLINIC_OR_DEPARTMENT_OTHER): Payer: Self-pay | Admitting: Surgery

## 2015-04-08 NOTE — Anesthesia Postprocedure Evaluation (Signed)
Anesthesia Post Note  Patient: Sonia [name]  Procedure(s) Performed: Procedure(s) (LRB): BREAST LUMPECTOMY WITH RADIOACTIVE SEED LOCALIZATION (Left)  Patient location during evaluation: PACU Anesthesia Type: General Level of consciousness: awake and alert and oriented Pain management: pain level controlled Vital Signs Assessment: post-procedure vital signs reviewed and stable Respiratory status: spontaneous breathing, nonlabored ventilation and respiratory function stable Cardiovascular status: blood pressure returned to baseline and stable Postop Assessment: no signs of nausea or vomiting Anesthetic complications: no    Last Vitals:  Filed Vitals:   04/07/15 0945 04/07/15 1015  BP: 109/84   Pulse: 88 89  Temp:  36.5 C  Resp: 14 16    Last Pain:  Filed Vitals:   04/07/15 1026  PainSc: 3                  Gessica Jawad A.

## 2015-04-22 ENCOUNTER — Other Ambulatory Visit: Payer: Self-pay | Admitting: Surgery

## 2015-04-22 DIAGNOSIS — N6092 Unspecified benign mammary dysplasia of left breast: Secondary | ICD-10-CM

## 2015-04-26 ENCOUNTER — Encounter: Payer: Self-pay | Admitting: Hematology and Oncology

## 2015-04-26 ENCOUNTER — Ambulatory Visit (HOSPITAL_BASED_OUTPATIENT_CLINIC_OR_DEPARTMENT_OTHER): Payer: Medicare Other | Admitting: Hematology and Oncology

## 2015-04-26 VITALS — BP 134/68 | HR 94 | Temp 97.8°F | Resp 18 | Wt 159.3 lb

## 2015-04-26 DIAGNOSIS — N62 Hypertrophy of breast: Secondary | ICD-10-CM | POA: Diagnosis not present

## 2015-04-26 DIAGNOSIS — N6092 Unspecified benign mammary dysplasia of left breast: Secondary | ICD-10-CM

## 2015-04-26 NOTE — Progress Notes (Signed)
Unable to get in to exam room prior to MD.  No assessment performed.  

## 2015-04-26 NOTE — Assessment & Plan Note (Signed)
Left lumpectomy 04/07/2015: Atypical lobular hyperplasia, radial scar with calcifications Pathology review: I discussed the difference between atypical lobular hyperplasia, LCIS and invasive lobular cancer. ALH is associated with an increased risk of both ipsilateral and contralateral breast cancer and thus provides evidence of underlying breast abnormalities that predispose to breast cancer.  Women with ALH should stop taking oral contraceptives, avoid hormone replacement therapy, and make appropriate lifestyle and dietary changes  Prognosis:Using the American Cancer Society breast cancer risk assessment tool, her risk of breast cancer in 5 years is at  ( average woman's risk is %), her lifetime risk of breast cancers at % ( average risk is a %)  I discussed the risks and benefits of tamoxifen therapy. I believe that the risks far outweigh any benefits from tamoxifen. Instead I recommended the following 1. Exercise 30 minutes daily 2. Weight loss 3. Increasing fruits and vegetables and less red meat I believe all of the above would extensively decrease her risk of breast cancer as well as other cancers including cancers of the colon substantially.  Surveillance plan: Annual mammograms and breast exams

## 2015-04-26 NOTE — Progress Notes (Signed)
[facility] CONSULT NOTE  Patient Care Team: [name], MD as PCP - General (Internal Medicine)  CHIEF COMPLAINTS/PURPOSE OF CONSULTATION:  Newly diagnosed left breast atypical lobular hyperplasia  HISTORY OF PRESENTING ILLNESS:  Sonia [name] 75 y.o. female is here because of recent diagnosis of left breast atypical lobular hyperplasia. She underwent lumpectomy and final pathology came back as atypical lobular hyperplasia. She is recovering very well from the lumpectomy surgery. She was sent to me to discuss risk reduction.   I reviewed her records extensively and collaborated the history with the patient.  In terms of breast cancer risk profile:  She menarched at early age of 51 and went to menopause at age 60-55  She had 2 pregnancy, her first child was born at age 43  She has not received birth control pills She was never exposed to fertility medications or hormone replacement therapy.  She has  family history of Breast/GYN/GI cancer Sister had breast cancer, heart disease in the mother and brother and sister Brother with prostate cancer  MEDICAL HISTORY:  Past Medical History  Diagnosis Date  . Vitamin D deficiency   . Arthritis   . COPD (chronic obstructive pulmonary disease) ([place])   . Hiatal hernia   . Headache     allergies; silent migraines  . Hypothyroidism   . GERD (gastroesophageal reflux disease)     no current med.  . Asthma     daily and prn inhalers; states good control currently  . 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 Hashimoto's thyroiditis     SURGICAL HISTORY: Past Surgical History  Procedure Laterality Date  . Appendectomy    . Abdominal hysterectomy      partial  . Knee arthroscopy Right 12/07/2003  . Nasal sinus surgery  age 87  . Rectal polypectomy  10/02/2006  . Laparoscopic salpingo oopherectomy Right 09/08/2003  . Lysis of adhesion  09/08/2003  . Breast cyst excision Left   . Breast lumpectomy with radioactive seed  localization Left 04/07/2015    Procedure: BREAST LUMPECTOMY WITH RADIOACTIVE SEED LOCALIZATION;  Surgeon: Erroll Luna, MD;  Location: [facility];  Service: General;  Laterality: Left;    SOCIAL HISTORY: Social History   Social History  . Marital Status: Divorced    Spouse Name: N/A  . Number of Children: 2  . Years of Education: N/A   Occupational History  . 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 Smoking status: Never Smoker   . Smokeless tobacco: Never Used  . Alcohol Use: Yes     Comment: rarely  . Drug Use: No  . Sexual Activity: Not on file   Other Topics Concern  . Not on file   Social History Narrative    FAMILY HISTORY: Family History  Problem Relation Age of Onset  . Asthma Mother   . Heart disease Mother   . Coronary artery disease    . Asthma Sister   . Cancer Sister     breast  . Heart disease Sister   . Heart disease Father   . Cancer Brother     prostate  . Cancer Maternal Aunt     breast  . Cancer Maternal Grandmother     kidney, stomach    ALLERGIES:  is allergic to adenosine; contrast media; codeine sulfate; latex; and adhesive.  MEDICATIONS:  Current Outpatient Prescriptions  Medication Sig Dispense Refill  .  albuterol (PROAIR HFA) 108 (90 Base) MCG/ACT inhaler INHALE TWO PUFFS INTO LUNGS EVERY 6 HOURS AS NEEDED 3 each 3  . budesonide-formoterol (SYMBICORT) 160-4.5 MCG/ACT inhaler Inhale 2 puffs into the lungs 2 (two) times daily. 3 Inhaler 3  . calcium carbonate (TUMS - DOSED IN MG ELEMENTAL CALCIUM) 500 MG chewable tablet Chew 1 tablet by mouth daily.    . cholecalciferol (VITAMIN D) 1000 units tablet Take 1,000 Units by mouth 2 (two) times a week.    . levothyroxine (SYNTHROID, LEVOTHROID) 125 MCG tablet 1 tab by mouth on the weekends    . mometasone (NASONEX) 50 MCG/ACT nasal spray Place 2 sprays into the nose daily.    . Omega-3 Fatty Acids (FISH OIL PO) Take by  mouth.    . SYNTHROID 137 MCG tablet 1 tab by mouth Monday through Friday    . traMADol (ULTRAM) 50 MG tablet Take 1 tablet (50 mg total) by mouth every 6 (six) hours as needed. 30 tablet 0   No current facility-administered medications for this visit.    REVIEW OF SYSTEMS:   Constitutional: Denies fevers, chills or abnormal night sweats Eyes: Denies blurriness of vision, double vision or watery eyes Ears, nose, mouth, throat, and face: Denies mucositis or sore throat Respiratory: Denies cough, dyspnea or wheezes Cardiovascular: Denies palpitation, chest discomfort or lower extremity swelling Gastrointestinal:  Denies nausea, heartburn or change in bowel habits Skin: Denies abnormal skin rashes Lymphatics: Denies new lymphadenopathy or easy bruising Neurological:Denies numbness, tingling or new weaknesses Behavioral/Psych: Mood is stable, no new changes  Breast: Recent lumpectomy surgery in the left breast All other systems were reviewed with the patient and are negative.  PHYSICAL EXAMINATION: ECOG PERFORMANCE STATUS: 1 - Symptomatic but completely ambulatory  Filed Vitals:   04/26/15 1259  BP: 134/68  Pulse: 94  Temp: 97.8 F (36.6 C)  Resp: 18   Filed Weights   04/26/15 1259  Weight: 159 lb 4.8 oz (72.258 kg)    GENERAL:alert, no distress and comfortable SKIN: skin color, texture, turgor are normal, no rashes or significant lesions EYES: normal, conjunctiva are pink and non-injected, sclera clear OROPHARYNX:no exudate, no erythema and lips, buccal mucosa, and tongue normal  NECK: supple, thyroid normal size, non-tender, without nodularity LYMPH:  no palpable lymphadenopathy in the cervical, axillary or inguinal LUNGS: clear to auscultation and percussion with normal breathing effort HEART: regular rate & rhythm and no murmurs and no lower extremity edema ABDOMEN:abdomen soft, non-tender and normal bowel sounds Musculoskeletal:no cyanosis of digits and no clubbing   PSYCH: alert & oriented x 3 with fluent speech NEURO: no focal motor/sensory deficits BREAST: No palpable nodules in breast. No palpable axillary or supraclavicular lymphadenopathy (exam performed in the presence of a chaperone)   LABORATORY DATA:  I have reviewed the data as listed Lab Results  Component Value Date   WBC 6.0 10/18/2010   HGB 14.6 10/18/2010   HCT 43.7 10/18/2010   MCV 91.9 10/18/2010   PLT 153.0 10/18/2010   Lab Results  Component Value Date   NA 140 10/18/2010   K 4.5 10/18/2010   CL 105 10/18/2010   CO2 29 10/18/2010   ASSESSMENT AND PLAN:  Atypical lobular hyperplasia of left breast Left lumpectomy 04/07/2015: Atypical lobular hyperplasia, radial scar with calcifications Pathology review: I discussed the difference between atypical lobular hyperplasia, LCIS and invasive lobular cancer. ALH is associated with an increased risk of both ipsilateral and contralateral breast cancer and thus provides evidence of underlying breast abnormalities  that predispose to breast cancer.  Women with ALH should avoid hormone replacement therapy, and make appropriate lifestyle and dietary changes  Prognosis:Using the American Cancer Society breast cancer risk assessment tool, her risk of breast cancer in 5 years is at 7.8% (average woman's risk is 3%), her lifetime risk of breast cancers at 15%% ( average risk is a 7%%)  I discussed the risks and benefits of tamoxifen therapy. I believe that the risks far outweigh any benefits from tamoxifen.  Instead I recommended the following 1. Exercise 30 minutes daily 2. Weight loss 3. Increasing fruits and vegetables and less red meat I believe all of the above would extensively decrease her risk of breast cancer as well as other cancers including cancers of the colon substantially.  Surveillance plan: Annual mammograms and breast exams RTC as needed  All questions were answered. The patient knows to call the clinic with any  problems, questions or concerns.    Rulon Eisenmenger, MD 04/26/2015

## 2015-04-29 DIAGNOSIS — R7301 Impaired fasting glucose: Secondary | ICD-10-CM | POA: Diagnosis not present

## 2015-04-29 DIAGNOSIS — E039 Hypothyroidism, unspecified: Secondary | ICD-10-CM | POA: Diagnosis not present

## 2015-04-29 DIAGNOSIS — E063 Autoimmune thyroiditis: Secondary | ICD-10-CM | POA: Diagnosis not present

## 2015-04-30 ENCOUNTER — Telehealth: Payer: Self-pay | Admitting: Hematology and Oncology

## 2015-04-30 NOTE — Telephone Encounter (Signed)
Left message for genetics appointment for 2/28. Per 2nd 2/14 pof no f/u needed.

## 2015-05-02 DIAGNOSIS — E162 Hypoglycemia, unspecified: Secondary | ICD-10-CM | POA: Diagnosis not present

## 2015-05-02 DIAGNOSIS — E039 Hypothyroidism, unspecified: Secondary | ICD-10-CM | POA: Diagnosis not present

## 2015-05-02 DIAGNOSIS — R7301 Impaired fasting glucose: Secondary | ICD-10-CM | POA: Diagnosis not present

## 2015-05-02 DIAGNOSIS — E063 Autoimmune thyroiditis: Secondary | ICD-10-CM | POA: Diagnosis not present

## 2015-05-06 ENCOUNTER — Telehealth: Payer: Self-pay | Admitting: Pulmonary Disease

## 2015-05-06 MED ORDER — AZITHROMYCIN 250 MG PO TABS: 250.0000 mg | ORAL_TABLET | ORAL | Status: DC

## 2015-05-06 NOTE — Telephone Encounter (Signed)
Can send script for Zpak. 

## 2015-05-06 NOTE — Telephone Encounter (Signed)
Spoke with the pt and notified of recs per VS  Rx was sent to pharm  Nothing further needed

## 2015-05-06 NOTE — Telephone Encounter (Signed)
Called spoke with pt. C/o bilateral ear pain, facial pain on R side, nasal cong, chills (but no fever). Denies any cough. She reports she has been doing nasal rinses x 1 week. Requesting ABX before it goes down into her chest. Please advise Dr. Halford Chessman thanks

## 2015-05-09 DIAGNOSIS — L821 Other seborrheic keratosis: Secondary | ICD-10-CM | POA: Diagnosis not present

## 2015-05-09 DIAGNOSIS — Z85820 Personal history of malignant melanoma of skin: Secondary | ICD-10-CM | POA: Diagnosis not present

## 2015-05-09 DIAGNOSIS — D239 Other benign neoplasm of skin, unspecified: Secondary | ICD-10-CM | POA: Diagnosis not present

## 2015-05-10 ENCOUNTER — Encounter: Payer: Self-pay | Admitting: Genetic Counselor

## 2015-05-10 ENCOUNTER — Ambulatory Visit (HOSPITAL_BASED_OUTPATIENT_CLINIC_OR_DEPARTMENT_OTHER): Payer: Medicare Other | Admitting: Genetic Counselor

## 2015-05-10 ENCOUNTER — Other Ambulatory Visit: Payer: Medicare Other

## 2015-05-10 DIAGNOSIS — Z315 Encounter for genetic counseling: Secondary | ICD-10-CM

## 2015-05-10 DIAGNOSIS — Z85820 Personal history of malignant melanoma of skin: Secondary | ICD-10-CM

## 2015-05-10 DIAGNOSIS — Z806 Family history of leukemia: Secondary | ICD-10-CM

## 2015-05-10 DIAGNOSIS — Z8042 Family history of malignant neoplasm of prostate: Secondary | ICD-10-CM

## 2015-05-10 DIAGNOSIS — Z808 Family history of malignant neoplasm of other organs or systems: Secondary | ICD-10-CM

## 2015-05-10 DIAGNOSIS — Z86010 Personal history of colonic polyps: Secondary | ICD-10-CM | POA: Diagnosis not present

## 2015-05-10 DIAGNOSIS — N62 Hypertrophy of breast: Secondary | ICD-10-CM

## 2015-05-10 DIAGNOSIS — Z803 Family history of malignant neoplasm of breast: Secondary | ICD-10-CM | POA: Insufficient documentation

## 2015-05-10 DIAGNOSIS — Z801 Family history of malignant neoplasm of trachea, bronchus and lung: Secondary | ICD-10-CM

## 2015-05-10 DIAGNOSIS — Z800 Family history of malignant neoplasm of digestive organs: Secondary | ICD-10-CM | POA: Diagnosis not present

## 2015-05-10 DIAGNOSIS — N6092 Unspecified benign mammary dysplasia of left breast: Secondary | ICD-10-CM

## 2015-05-10 NOTE — Progress Notes (Addendum)
REFERRING PROVIDER: Nicholas Lose, MD  PRIMARY PROVIDER:  Irven Shelling, MD  PRIMARY REASON FOR VISIT:  1. Atypical lobular hyperplasia of left breast   2. Family history of breast cancer in female   3. History of melanoma   4. Family history of prostate cancer   5. Family history of melanoma   6. Family history of stomach cancer   7. Family history of leukemia   8. Family history of oral cancer   9. Family history of lung cancer      HISTORY OF PRESENT ILLNESS:   Sonia [name], a 75 y.o. female, was seen for a [facility] cancer genetics consultation at the request of Dr. Lindi Adie due to a personal history of [employer], family history of early-onset breast cancer, and family history of prostate, melanoma, and other cancers.  Sonia [name] presents to clinic today to discuss the possibility of a hereditary predisposition to cancer, genetic testing, and to further clarify her future cancer risks, as well as potential cancer risks for family members.   In January 2017, at the age of 50, Sonia [name] was diagnosed with atypical lobular hyperplasia with fibrocystic changes, adenosis, and calcifications of the left breast. This was treated with left breast lumpectomy at [facility].  Sonia [name] also reports a history of melanoma in her late 15s.   CANCER HISTORY:   No history exists.     HORMONAL RISK FACTORS:  Menarche was at age 59.  First live birth at age 67.  OCP use for approximately 1 month Ovaries intact: no.  Hysterectomy: yes.  Menopausal status: postmenopausal.  HRT use: 0 years. Colonoscopy: yes; history of a tubular adenoma. Mammogram within the last year: yes. Number of breast biopsies: multiple Up to date with pelvic exams:  yes. Any excessive radiation exposure in the past:  no, but reports a history of light therapy to her face to treat acne (age 51); also reports a history of secondhand smoke.  Past Medical History  Diagnosis Date  . Vitamin D deficiency   .  Arthritis   . COPD (chronic obstructive pulmonary disease) ([place])   . Hiatal hernia   . Headache     allergies; silent migraines  . Hypothyroidism   . GERD (gastroesophageal reflux disease)     no current med.  . Asthma     daily and prn inhalers; states good control currently  . 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 Hashimoto's thyroiditis     Past Surgical History  Procedure Laterality Date  . Appendectomy    . Abdominal hysterectomy      partial  . Knee arthroscopy Right 12/07/2003  . Nasal sinus surgery  age 28  . Rectal polypectomy  10/02/2006  . Laparoscopic salpingo oopherectomy Right 09/08/2003  . Lysis of adhesion  09/08/2003  . Breast cyst excision Left   . Breast lumpectomy with radioactive seed localization Left 04/07/2015    Procedure: BREAST LUMPECTOMY WITH RADIOACTIVE SEED LOCALIZATION;  Surgeon: Erroll Luna, MD;  Location: [facility];  Service: General;  Laterality: Left;    Social History   Social History  . Marital Status: Divorced    Spouse Name: N/A  . Number of Children: 2  . Years of Education: N/A   Occupational History  . 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 Smoking status: Never Smoker   . Smokeless tobacco: Never Used  . Alcohol Use: Yes  Comment: rarely  . Drug Use: No  . Sexual Activity: Not Asked   Other Topics Concern  . None   Social History Narrative     FAMILY HISTORY:  We obtained a detailed, 4-generation family history.  Significant diagnoses are listed below: Family History  Problem Relation Age of Onset  . Asthma Mother   . Heart disease Mother   . Coronary artery disease    . Asthma Sister   . Heart disease Sister   . Breast cancer Sister 38  . Heart disease Father   . Kidney disease Father   . Prostate cancer Brother     dx. 44s  . Heart attack Brother 69  . Alzheimer's disease Brother 26  . Breast cancer Maternal Aunt 70  . Stomach  cancer Maternal Grandfather 60  . Asthma Sister   . Memory loss Sister   . Heart attack Son 24  . Spinal muscular atrophy Grandchild     type II  . Cancer Other 88    niece dx. ca of salivary gland   . Breast cancer Other 27    niece; negative genetic testing 10 years ago  . Melanoma Other   . Alzheimer's disease Maternal Aunt   . Cervical cancer Cousin     maternal 1st cousin dx. at 63  . Breast cancer Cousin 40    maternal 1st cousin  . Lung cancer Cousin 25    maternal 1st cousin; smoker  . Heart disease Cousin   . Leukemia Cousin 14    paternal 1st cousin  . Breast cancer Other     paternal great grandmother (PGF's mother) dx. late 70s-early 34s    Sonia [name] has two sons.  One son died of a heart attack at age 47.  This son had two fraternal twins, ages 27-13, one of whom was diagnosed with spinal muscular atrophy and uses a wheelchair.  Her other son is currently 31; he has no history of cancer.  He has one daughter of his own and she is currently 14.  Sonia [name] has two full sisters and one full brother.  One sister, Inez Catalina, died at age 76.  She was diagnosed with breast cancer at 53.  This sister has one son and one daughter, but Sonia [name] has no information for them.  Sonia [name] other sister, Manuela Schwartz, is currently 12 and has never had cancer.  Sonia [name] brother is currently in his 5s and was recently diagnosed with Alzheimer's.  His medical history is significant for a history of prostate cancer in his 60s and a history of a heart attack at age 23.  This brother has three daughters and one son.  One daughter--currently in her 50s--was diagnosed with breast cancer at age 70.  Sonia [name] reports that this niece had negative genetic testing around the time of her cancer diagnosis.   The other two daughters of Sonia [name] brother were diagnosed with salivary gland cancer at age 78 and melanoma at an unspecified age.    Sonia [name] mother died of heart disease at age 30.   She had two full sisters.  One sister was diagnosed with breast cancer at age 34 and passed away at age 77.  This maternal aunt had two sons; one son, a smoker, was diagnosed with lung cancer at the age of 46. The other sister had a history of alzheimer's, but was never diagnosed with cancer.  She passed away at age 74.  This maternal aunt  had two daughters.  One daughter was diagnosed with breast cancer at 61; the other daughter was diagnosed with cervical cancer at the age of 85.  Sonia [name] maternal grandmother died at age 81.  Her maternal grandfather died of stomach cancer at the age of 51.  Sonia [name] is unaware of any additional cancers for maternal great aunts/uncles or great grandparents.  Sonia [name] father died of kidney disease at the age of 61.  He had two full sisters and one full brother.  His brother died in the war at the age of 22.  His sister Earlie Server died in her 25s.  She had six children--one son was diagnosed with leukemia at the age of 46.  Another son died of diabetes at the age of 53.  Sonia [name]. Trent other paternal aunt died in her 24s.  Her paternal grandmother died at age 72 and her paternal grandfather died of a heart attack at the age of 7.  Sonia [name] reports that her paternal great grandmother (her paternal grandfather's mother) was diagnosed with breast cancer in her late 20s-early 80s.    Patient's maternal ancestors are of [place] and Jewish descent, and paternal ancestors are of [place] descent. There is reported Ashkenazi Jewish ancestry. There is no known consanguinity.  GENETIC COUNSELING ASSESSMENT: Sonia [name] is a 75 y.o. female with a family history of early-onset breast and other cancers which is somewhat suggestive of a hereditary cancer syndrome and predisposition to cancer. We, therefore, discussed and recommended the following at today's visit.   DISCUSSION: We reviewed the characteristics, features and inheritance patterns of hereditary cancer  syndromes, particularly those caused by mutation within the BRCA1/2 genes. We also discussed genetic testing, including the appropriate family members to test, the process of testing, insurance coverage and turn-around-time for results. We discussed the implications of a negative, positive and/or variant of uncertain significant result. We recommended Sonia [name]. Bayard Males pursue genetic testing for the BRCA1/BRCA2 Ashkenazi Founder Panel through [facility] with reflex to a 22-gene Custom Cancer Cancer Panel if negative.  The BRCA1/BRCA2 Ashkenazi Founder Panel includes analysis for three pathogenic BRCA mutations more commonly found in the Ashkenazi Jewish population including: "c.68_69delAG" and "c.5266dupC" on the BRCA1 gene and "c.5946delT" on the BRCA2 gene.  This 22-gene Custom Cancer Panel offered by [facility] Hope Pigeon, MD) includes sequencing and/or deletion/duplication analysis of the following genes:  ATM, BARD1, BRCA1, BRCA2, BRIP1, CDH1, CDK4, CDKN2A, CHEK2, EPCAM, FANCC, MLH1, MSH2, MSH6, NBN, PALB2, PMS2, PTEN, RAD51C, RAD51D, TP53, and XRCC2.    Based on Sonia [name]'s family history of cancer, she meets medical criteria for genetic testing. Despite that she meets criteria, she may still have an out of pocket cost. We discussed that if her out of pocket cost for testing is over $100, the laboratory will call and confirm whether she wants to proceed with testing.  If the out of pocket cost of testing is less than $100 she will be billed by the genetic testing laboratory.   Based on the patient's personal and family history, a statistical model (IBIS/Tyrer-Cuzick)  and literature data were used to estimate her risk of developing breast cancer. These estimate her lifetime risk of developing breast cancer to be approximately 14.8%. This estimation does not take into account any genetic testing results.  The patient's lifetime breast cancer risk is a preliminary estimate based on available  information using one of several models endorsed by the [place] (ACS). The ACS recommends consideration of breast MRI screening as  an adjunct to mammography for patients at high risk (defined as 20% or greater lifetime risk). A more detailed breast cancer risk assessment can be considered, if clinically indicated.   Sonia [name]. Mineau was also very interested in genetic testing for Alzheimer's disease.  We discussed that we do not order genetic testing outside of that for hereditary cancer here.  I gave her the phone number for an adult genetics genetic counselor at [facility].  I briefly discussed with her that there are genetic variants that have been identified which are linked to increased Alzheimer's risks, but that these are not considered to be clinically useful at this point, partly due to reduced penetrance and also due to lack of treatment options.    PLAN: After considering the risks, benefits, and limitations, Sonia [name]. Mcgregory  provided informed consent to pursue genetic testing and the blood sample was sent to [facility] for analysis of the BRCA1/2 Ashkenazi Founder Panel with reflex to the 22-gene Custom Cancer Panel, if negative. Results should be available within approximately 2-3 weeks' time, at which point they will be disclosed by telephone to Sonia [name]. Bayard Males, as will any additional recommendations warranted by these results. Sonia [name]. Hams will receive a summary of her genetic counseling visit and a copy of her results once available. This information will also be available in Epic. We encouraged Sonia [name]. Bayard Males to remain in contact with cancer genetics annually so that we can continuously update the family history and inform her of any changes in cancer genetics and testing that may be of benefit for her family. Sonia [name]. Desantiago questions were answered to her satisfaction today. Our contact information was provided should additional questions or concerns arise.  Thank you for the referral and  allowing [place] to share in the care of your patient.   Jeanine Luz, Sonia [name] Genetic Counselor [email or web address] Phone: [phone]  The patient was seen for a total of 70 minutes in face-to-face genetic counseling.  This patient was discussed with Drs. Magrinat, Lindi Adie and/or Burr Medico who agrees with the above.    _______________________________________________________________________ For Office Staff:  Number of people involved in session: 1 Was an Intern/ student involved with case: no

## 2015-05-11 NOTE — Progress Notes (Deleted)
This 22 gene Custom Cancer Panel offered by [facility] Hope Pigeon, MD) includes sequencing and/or deletion/duplication analysis of the following genes:  ATM, BARD1, BRCA1, BRCA2, BRIP1, CDH1, CDK4, CDKN2A, CHEK2, FANCC, MLH1, MSH2, MSH6, NBN, PALB2, PMS2, PTEN, RAD51C, RAD51D, TP53, and XRCC2.  This panel also includes deletion/duplication analysis (without sequencing) for one gene, EPCAM.

## 2015-05-26 ENCOUNTER — Telehealth: Payer: Self-pay | Admitting: Genetic Counselor

## 2015-05-26 NOTE — Telephone Encounter (Signed)
Discussed that genetic test results are due back on March 28th, but that the hope is that those will be in sooner.  Also discussed that the 3-site Ashkenazi Jewish founder mutation panel is back and was negative, so the remainder of the testing being currently performed is the entire panel test.  I will call Sonia [name] as soon as those results are back.  She is welcome to call with any further questions.

## 2015-05-27 ENCOUNTER — Telehealth: Payer: Self-pay | Admitting: Genetic Counselor

## 2015-05-27 NOTE — Telephone Encounter (Signed)
Discussed with Sonia [name] that her genetic test results were negative for known pathogenic mutations within any of 22 genes on a Custom Cancer Panel (20 Breast/Ovarian Cancer genes plus CDK2NA and CDK4 due to the history of melanoma).  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  Discussed that we treat this just like a negative result until it gets reclassified by the lab and reviewed why we do that.  Encouraged her to keep her phone number up to date with [place], so that we can call and let her know if/when this gets reclassified.  Discussed that this is most likely a reassuring result for [place].  Sonia [name] should continue to follow her doctor's recommendations for future cancer screening.  Some of her other family members may still be eligible for genetic counseling and testing, if they are interested--including her sister's children and her niece who had genetic testing approximately 10 years ago.  Sonia [name] would like a copy of her test results, so I will send that to her along with a results letter.  She is welcome to call me with any questions.

## 2015-05-30 ENCOUNTER — Ambulatory Visit: Payer: Self-pay | Admitting: Genetic Counselor

## 2015-05-30 DIAGNOSIS — Z1379 Encounter for other screening for genetic and chromosomal anomalies: Secondary | ICD-10-CM

## 2015-06-01 ENCOUNTER — Encounter: Payer: Self-pay | Admitting: Genetic Counselor

## 2015-06-09 DIAGNOSIS — Z1379 Encounter for other screening for genetic and chromosomal anomalies: Secondary | ICD-10-CM | POA: Insufficient documentation

## 2015-06-09 NOTE — Progress Notes (Signed)
GENETIC TEST RESULT  HPI: Ms. Stuckert was previously seen in the [facility] due to a personal atypical lobular hyperplasia and melanoma, family history of breast, prostate, melanoma, and other cancers, and concerns regarding a hereditary predisposition to cancer. Please refer to our prior cancer genetics clinic note from May 10, 2015 for more information regarding Ms. Fortin's medical, social and family histories, and our assessment and recommendations, at the time. Ms. Langsam recent genetic test results were disclosed to her, as were recommendations warranted by these results. These results and recommendations are discussed in more detail below.  GENETIC TEST RESULTS: At the time of Ms. Alexandria visit on 05/10/15, we recommended she pursue genetic testing of the BRCA1/2 Ashkenazi Founder Panel with reflex to the 22-gene Custom Cancer Panel through [facility] Hope Pigeon, MD).  This 22-gene Custom Panel offered by [facility] Hope Pigeon, MD) includes sequencing and/or deletion/duplication analysis for the following genes:  ATM, BARD1, BRCA1, BRCA2, BRIP1, CDH1, CDK4, CDKN2A, CHEK2, EPCAM, FANCC, MLH1, MSH2, MSH6, NBN, PALB2, PMS2, PTEN, RAD51C, RAD51D, TP53, and XRCC2.  The BRCA1/2 Ashkenazi Liliana Cline Panel was negative; date of test report is May 17, 2015.  So testing was reflexed to the 22-gene Custom Panel.  Those results are now back, the report date for which is May 26, 2015.  Genetic testing was normal, and did not reveal a known deleterious mutation in these genes.  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  The test report will be scanned into EPIC and will be located under the Results Review tab in the Pathology>Molecular Pathology section.   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 At this time, it is unknown if this VUS is associated with an increased risk for cancer or if this is a normal finding. Since this VUS result is uncertain, it cannot help guide screening recommendations, and family members should not be tested for this VUS to help define their own cancer risks.  Also, we all have variants within our genes that make [place] unique individuals--most of these variants are benign.  Thus, we treat this VUS as a negative result.   With time, we suspect the lab will reclassify this variant and when they do, we will try to re-contact Ms. Bayard Males to discuss the reclassification further.  We also encouraged Ms. Bayard Males to contact us in a year or two to obtain an update on the status of this VUS.  We discussed with Ms. Wandrey that since the current genetic testing is not perfect, it is possible there may be a gene mutation in one of these genes that current testing cannot detect, but that chance is small. We also discussed, that it is possible that another gene that has not yet been discovered, or that we have not yet tested, is responsible for the cancer diagnoses in the family, and it is, therefore, important to remain in touch with cancer genetics in the future so that we can continue to offer Ms. Patch the most up to date genetic testing.   CANCER SCREENING RECOMMENDATIONS: This normal result is reassuring and indicates that Ms. Augsburger does not likely have an increased risk of cancer due to a mutation in one of these genes.  We, therefore, recommended  Ms. Clover continue to follow the cancer screening guidelines provided by her primary healthcare providers.   RECOMMENDATIONS FOR FAMILY  MEMBERS: Women in this family might be at some increased risk of developing cancer, over the general population risk, simply due to the family history of cancer. We recommended women in this family have a yearly mammogram beginning at age 20, or 97 years younger than the earliest onset of  cancer, an an annual clinical breast exam, and perform monthly breast self-exams. Women in this family should also have a gynecological exam as recommended by their primary provider. All family members should have a colonoscopy by age 60.  Based on Ms. Karges's family history, we recommended her sister's children, (her sister who was diagnosed with breast cancer at age 45 and who has passed away), have genetic counseling and testing.  Her other niece who was diagnosed with breast cancer at the age of 58 and who had negative genetic testing approximately 10 years ago would also be eligible for updated genetic testing.  Ms. Faulks will let us know if we can be of any assistance in coordinating genetic counseling and/or testing for these family members.   FOLLOW-UP: Lastly, we discussed with Ms. Tench that cancer genetics is a rapidly advancing field and it is possible that new genetic tests will be appropriate for her and/or her family members in the future. We encouraged her to remain in contact with cancer genetics on an annual basis so we can update her personal and family histories and let her know of advances in cancer genetics that may benefit this family.   Our contact number was provided. Ms. Gunnels questions were answered to her satisfaction, and she knows she is welcome to call us at anytime with additional questions or concerns.   Jeanine Luz, MS, [facility] Certified Genetic Counselor [email or web address] Phone: [phone]

## 2015-06-10 DIAGNOSIS — E039 Hypothyroidism, unspecified: Secondary | ICD-10-CM | POA: Diagnosis not present

## 2015-07-11 DIAGNOSIS — H5212 Myopia, left eye: Secondary | ICD-10-CM | POA: Diagnosis not present

## 2015-07-11 DIAGNOSIS — H2513 Age-related nuclear cataract, bilateral: Secondary | ICD-10-CM | POA: Diagnosis not present

## 2015-07-26 DIAGNOSIS — J029 Acute pharyngitis, unspecified: Secondary | ICD-10-CM | POA: Diagnosis not present

## 2015-07-26 DIAGNOSIS — J449 Chronic obstructive pulmonary disease, unspecified: Secondary | ICD-10-CM | POA: Diagnosis not present

## 2015-08-17 DIAGNOSIS — J301 Allergic rhinitis due to pollen: Secondary | ICD-10-CM | POA: Diagnosis not present

## 2015-08-30 DIAGNOSIS — E039 Hypothyroidism, unspecified: Secondary | ICD-10-CM | POA: Diagnosis not present

## 2015-09-28 DIAGNOSIS — E038 Other specified hypothyroidism: Secondary | ICD-10-CM | POA: Diagnosis not present

## 2015-09-28 DIAGNOSIS — E039 Hypothyroidism, unspecified: Secondary | ICD-10-CM | POA: Diagnosis not present

## 2015-09-28 DIAGNOSIS — E1165 Type 2 diabetes mellitus with hyperglycemia: Secondary | ICD-10-CM | POA: Diagnosis not present

## 2015-10-17 DIAGNOSIS — Z85820 Personal history of malignant melanoma of skin: Secondary | ICD-10-CM | POA: Diagnosis not present

## 2015-10-17 DIAGNOSIS — E038 Other specified hypothyroidism: Secondary | ICD-10-CM | POA: Diagnosis not present

## 2015-10-17 DIAGNOSIS — E063 Autoimmune thyroiditis: Secondary | ICD-10-CM | POA: Diagnosis not present

## 2015-10-17 DIAGNOSIS — D126 Benign neoplasm of colon, unspecified: Secondary | ICD-10-CM | POA: Diagnosis not present

## 2015-10-17 DIAGNOSIS — Z1389 Encounter for screening for other disorder: Secondary | ICD-10-CM | POA: Diagnosis not present

## 2015-10-17 DIAGNOSIS — I493 Ventricular premature depolarization: Secondary | ICD-10-CM | POA: Diagnosis not present

## 2015-10-17 DIAGNOSIS — N183 Chronic kidney disease, stage 3 (moderate): Secondary | ICD-10-CM | POA: Diagnosis not present

## 2015-10-17 DIAGNOSIS — J449 Chronic obstructive pulmonary disease, unspecified: Secondary | ICD-10-CM | POA: Diagnosis not present

## 2015-11-15 DIAGNOSIS — E162 Hypoglycemia, unspecified: Secondary | ICD-10-CM | POA: Diagnosis not present

## 2015-11-15 DIAGNOSIS — E063 Autoimmune thyroiditis: Secondary | ICD-10-CM | POA: Diagnosis not present

## 2015-11-15 DIAGNOSIS — R7301 Impaired fasting glucose: Secondary | ICD-10-CM | POA: Diagnosis not present

## 2015-11-15 DIAGNOSIS — E039 Hypothyroidism, unspecified: Secondary | ICD-10-CM | POA: Diagnosis not present

## 2016-01-04 ENCOUNTER — Encounter: Payer: Self-pay | Admitting: Pulmonary Disease

## 2016-01-04 ENCOUNTER — Ambulatory Visit (INDEPENDENT_AMBULATORY_CARE_PROVIDER_SITE_OTHER): Payer: Medicare Other | Admitting: Pulmonary Disease

## 2016-01-04 VITALS — BP 102/80 | HR 86 | Ht 64.5 in | Wt 160.0 lb

## 2016-01-04 DIAGNOSIS — J069 Acute upper respiratory infection, unspecified: Secondary | ICD-10-CM

## 2016-01-04 DIAGNOSIS — J449 Chronic obstructive pulmonary disease, unspecified: Secondary | ICD-10-CM | POA: Diagnosis not present

## 2016-01-04 MED ORDER — AMOXICILLIN-POT CLAVULANATE 875-125 MG PO TABS: 1.0000 | ORAL_TABLET | Freq: Two times a day (BID) | ORAL | 0 refills | Status: DC

## 2016-01-04 MED ORDER — BUDESONIDE-FORMOTEROL FUMARATE 160-4.5 MCG/ACT IN AERO: 2.0000 | INHALATION_SPRAY | Freq: Two times a day (BID) | RESPIRATORY_TRACT | 0 refills | Status: DC

## 2016-01-04 NOTE — Addendum Note (Signed)
Addended by: Virl Cagey on: 01/04/2016 05:12 PM   Modules accepted: Orders

## 2016-01-04 NOTE — Patient Instructions (Signed)
Can take augmentin if symptoms get worse  Follow up in 1 year

## 2016-01-04 NOTE — Progress Notes (Signed)
Current Outpatient Prescriptions on File Prior to Visit  Medication Sig  . albuterol (PROAIR HFA) 108 (90 Base) MCG/ACT inhaler INHALE TWO PUFFS INTO LUNGS EVERY 6 HOURS AS NEEDED  . budesonide-formoterol (SYMBICORT) 160-4.5 MCG/ACT inhaler Inhale 2 puffs into the lungs 2 (two) times daily.  Marland Kitchen SYNTHROID 137 MCG tablet 1 tab by mouth Monday through Friday   No current facility-administered medications on file prior to visit.     Chief Complaint  Patient presents with  . Follow-up    pt c/o increased fatigue and little wheezing. Pt states that she is having some ear pain and sore throat.     Pulmonary tests PFT 04/15/08 >> FEV1 1.63 (76%), FEV1% 47, TLC 5.68(113%), DLCO 87%, no BD PFT 07/30/11 >> FEV1 1.25 (61%), FEV1% 47, TLC 4.79 (96%), DLCO 94%, no BD  Cardiac tests Echo 10/30/10 >> EF 55 to 60%, grade 2 DD  Past medical history HLD, Vertigo, GERD, HH, Melanoma, Hypothyroidism  PSHx, Medications, Allergies, Fhx, Shx reviewed  Vital signs BP 102/80 (BP Location: Left Arm, Cuff Size: Normal)   Pulse 86   Ht 5' 4.5" (1.638 m)   Wt 160 lb (72.6 kg)   SpO2 98%   BMI 27.04 kg/m    History of Present Illness: Sonia [name] is a 75 y.o. 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  Her grandson got a cold.  Now she has it.  She has sinus congestion, ear congestion, sore throat, and cough.  She is not having fever.  She had some wheezing.  She has loose BM yesterday.  Denies abdominal pain, nausea, skin rash, or swelling.  Uses symbicort two puffs in AM and one puff in PM.  Physical Exam:  General - no distress HEENT - no sinus tenderness, no oral exudate, no LAN Cardiac - s1s2 regular, no murmur  Chest - no wheeze/rales Abd - soft, non-tender Ext - no edema Neuro - normal strength Psych - normal mood/behavior   Assessment/Plan:  Upper respiratory infection. - likely viral - have given her script for augmentin >> advised not to fill this unless her symptoms get  worse - defer CXR, prednisone, labs for now  COPD with asthma. - continue symbicort with prn albuterol >> she gets 3 months supplies    Patient Instructions  Can take augmentin if symptoms get worse  Follow up in 1 year    Sonia Mires, MD [place] 01/04/2016, 2:11 PM Pager:  [phone]

## 2016-02-23 ENCOUNTER — Other Ambulatory Visit: Payer: Self-pay | Admitting: Pulmonary Disease

## 2016-02-24 ENCOUNTER — Telehealth: Payer: Self-pay | Admitting: Pulmonary Disease

## 2016-02-24 MED ORDER — METHYLPREDNISOLONE 4 MG PO TBPK: ORAL_TABLET | ORAL | 0 refills | Status: DC

## 2016-02-24 NOTE — Telephone Encounter (Signed)
Spoke with pt. States that she is having a flare up. Reports SOB, chest tightness, wheezing, coughing and PND. Cough is producing clear mucus in the AM. 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 There was another message that was taken about pt requesting to see VS on 03/15/16. Advised pt that she should not wait until then for this matter to be handled. She would like have to a prescription for Medrol 4mg  dose pack to be called in.  VS - please advise. Thanks.

## 2016-02-24 NOTE — Telephone Encounter (Signed)
Spoke with pt. She is aware of VS recommendation. Rx has been sent in. Nothing further was needed. 

## 2016-02-24 NOTE — Telephone Encounter (Signed)
Spoke with pt. There is a duplicate message on this. This message will be closed as pt is sick and will not be waiting until 03/15/16 for an appointment or response.

## 2016-02-24 NOTE — Telephone Encounter (Signed)
Okay to send order for medrol dose pack.

## 2016-02-27 NOTE — Telephone Encounter (Signed)
Patient called to to confirm appointment. Last conversation with nurse, patient suppose to be fit into Dr. Juanetta Gosling schedule today or tomorrow around 1.

## 2016-02-27 NOTE — Telephone Encounter (Signed)
Spoke with pt. There must have been some confusing with her previous phone call, she stated she thought that she was suppose to have a appointment with VS in Jan. But the only thing I could see was that she was requesting an appointment because she was sick but instead of her waiting something was called in. Pt. Is also confused but had no further questions. At this time nothing further is needed

## 2016-03-06 ENCOUNTER — Telehealth: Payer: Self-pay | Admitting: Pulmonary Disease

## 2016-03-06 NOTE — Telephone Encounter (Signed)
Spoke with pt. She is requesting to have an appointment for an Asthma flare. Pt has been scheduled with MW on 03/07/16 at 8:45am. Nothing further was needed.

## 2016-03-07 ENCOUNTER — Encounter: Payer: Self-pay | Admitting: Internal Medicine

## 2016-03-07 ENCOUNTER — Ambulatory Visit (INDEPENDENT_AMBULATORY_CARE_PROVIDER_SITE_OTHER)
Admission: RE | Admit: 2016-03-07 | Discharge: 2016-03-07 | Disposition: A | Discharge status: Home / Self Care | Payer: Medicare Other | Source: Ambulatory Visit | Attending: Internal Medicine | Admitting: Internal Medicine

## 2016-03-07 ENCOUNTER — Ambulatory Visit (INDEPENDENT_AMBULATORY_CARE_PROVIDER_SITE_OTHER): Payer: Medicare Other | Admitting: Internal Medicine

## 2016-03-07 VITALS — BP 144/80 | HR 96 | Ht 64.5 in | Wt 160.8 lb

## 2016-03-07 DIAGNOSIS — J45901 Unspecified asthma with (acute) exacerbation: Secondary | ICD-10-CM | POA: Insufficient documentation

## 2016-03-07 DIAGNOSIS — R0602 Shortness of breath: Secondary | ICD-10-CM | POA: Diagnosis not present

## 2016-03-07 DIAGNOSIS — R05 Cough: Secondary | ICD-10-CM | POA: Diagnosis not present

## 2016-03-07 LAB — NITRIC OXIDE: Other: 9

## 2016-03-07 MED ORDER — PREDNISONE 10 MG PO TABS: ORAL_TABLET | ORAL | 0 refills | Status: DC

## 2016-03-07 MED ORDER — PREDNISONE 10 MG PO TABS
ORAL_TABLET | ORAL | 0 refills | Status: DC
Start: 2016-03-07 — End: 2016-07-13

## 2016-03-07 MED ORDER — PANTOPRAZOLE SODIUM 40 MG PO TBEC: 40.0000 mg | DELAYED_RELEASE_TABLET | Freq: Every day | ORAL | 2 refills | Status: DC

## 2016-03-07 MED ORDER — ALBUTEROL SULFATE (2.5 MG/3ML) 0.083% IN NEBU: 2.5000 mg | INHALATION_SOLUTION | Freq: Four times a day (QID) | RESPIRATORY_TRACT | 12 refills | Status: DC | PRN

## 2016-03-07 MED ORDER — FAMOTIDINE 20 MG PO TABS: ORAL_TABLET | ORAL | 2 refills | Status: DC

## 2016-03-07 NOTE — Progress Notes (Signed)
Pulmonary tests PFT 04/15/08 >> FEV1 1.63 (76%), FEV1% 47, TLC 5.68(113%), DLCO 87%, no BD PFT 07/30/11 >> FEV1 1.25 (61%), FEV1% 47, TLC 4.79 (96%), DLCO 94%, no BD  Cardiac tests Echo 10/30/10 >> EF 55 to 60%, grade 2 DD  Past medical history HLD, Vertigo, GERD, HH, Melanoma, Hypothyroidism      History of Present Illness: Dr Halford Chessman ov  01/04/16  Glori Dalsing is a 75 y.o. 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 Her grandson got a cold.  Now she has it.  She has sinus congestion, ear congestion, sore throat, and cough.  She is not having fever.  She had some wheezing.  She has loose BM yesterday.  Denies abdominal pain, nausea, skin rash, or swelling. Uses symbicort two puffs in AM and one puff in PM. 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 Asthma flare Chief Complaint  Patient presents with  . Acute Visit    Pt c/o cough, wheezing and increased SOB x 2 wks. She is coughing up clear sputum.   prev w/u showed bad reflux  maint at baseline symb 160 2 bid and no proair  02/24/16 called in 7 days of worse cough/ wheeze/sob> rx medrol not clear she took it Exp to ggchild sick prior to onset of cough  No obvious day to day or daytime variability or assoc excess/ purulent sputum or mucus plugs or hemoptysis or cp or chest tightness,   or overt sinus or hb symptoms. No unusual exp hx or h/o childhood pna/ asthma or knowledge of premature birth.  Sleeping ok without nocturnal  or early am exacerbation  of respiratory  c/o's or need for noct saba. Also denies any obvious fluctuation of symptoms with weather or environmental changes or other aggravating or alleviating factors except as outlined above   Current Medications, Allergies, Complete Past Medical History, Past Surgical History, Family History, and Social History were reviewed in [facility] record.  ROS  The following are not active complaints unless bolded sore throat, dysphagia, dental problems, itching, sneezing,  nasal congestion or excess/ purulent secretions, ear ache,   fever, chills, sweats, unintended wt loss, classically pleuritic or exertional cp,  orthopnea pnd or leg swelling, presyncope, palpitations, abdominal pain, anorexia, nausea, vomiting, diarrhea  or change in bowel or bladder habits, change in stools or urine, dysuria,hematuria,  rash, arthralgias, visual complaints, headache, numbness, weakness or ataxia or problems with walking or coordination,  change in mood/affect or memory.                 Physical Exam:  Patient failed to answer a single question asked in a straightforward manner, tending to go off on tangents or answer questions with ambiguous medical terms or diagnoses and seemed perplexed  when asked the same question more than once for clarification. eg   "I have asthma"  Q how to you feel though   A how should I ?    Q what kind of symptoms do you have  A Bad  . Wt Readings from Last 3 Encounters:  03/07/16 160 lb 12.8 oz (72.9 kg)  01/04/16 160 lb (72.6 kg)  04/26/15 159 lb 4.8 oz (72.3 kg)    Vital signs reviewed - Note on arrival 02 sats  98% on RA     General - no distress  HEENT - no sinus tenderness, no oral exudate, no LAN Cardiac - s1s2 regular, no murmur  Chest -  Prominent pseudowheeze with very little true wheezing on exp bilaterally  Abd - soft, non-tender Ext - no edema Neuro - normal strength Psych - normal mood/behavior  CXR PA and Lateral:   03/07/2016 :    I personally reviewed images and agree with radiology impression as follows:    No active cardiopulmonary disease.  Large hiatal hernia.    Assessment/Plan:

## 2016-03-07 NOTE — Patient Instructions (Addendum)
Take 4 for three days 3 for three days 2 for three days 1 for three days and stop   Start Pantoprazole (protonix) 40 mg   Take  30-60 min before first meal of the day and Pepcid (famotidine)  20 mg one @  bedtime until return to office - this is the best way to tell whether stomach acid is contributing to your problem.    Please remember to go to the  x-ray department downstairs for your tests - we will call you with the results when they are available.

## 2016-03-07 NOTE — Progress Notes (Signed)
Spoke with pt and notified of results per Dr. Wert. Pt verbalized understanding and denied any questions. 

## 2016-03-12 NOTE — Assessment & Plan Note (Addendum)
FENO 03/07/2016  =   9   Flare p exp to sick ggchild suggests viral illness but she has no other features of an infection and atypical exam/hx - in any case her symptoms are difficult to control  DDX of  difficult airways management almost all start with A and  include Adherence, Ace Inhibitors, Acid Reflux, Active Sinus Disease, Alpha 1 Antitripsin deficiency, Anxiety masquerading as Airways dz,  ABPA,  Allergy(esp in young), Aspiration (esp in elderly), Adverse effects of meds,  Active smokers, A bunch of PE's (a small clot burden can't cause this syndrome unless there is already severe underlying pulm or vascular dz with poor reserve) plus two Bs  = Bronchiectasis and Beta blocker use..and one C= CHF  Adherence is always the initial "prime suspect" and is a multilayered concern that requires a "trust but verify" approach in every patient - starting with knowing how to use medications, especially inhalers, correctly, keeping up with refills and understanding the fundamental difference between maintenance and prns vs those medications only taken for a very short course and then stopped and not refilled.  - - The proper method of use, as well as anticipated side effects, of a metered-dose inhaler are discussed and demonstrated to the patient. Improved effectiveness after extensive coaching during this visit to a level of approximately 90 % from a baseline of 75 % > ok to continue symb 160 2bid  ? Acid (or non-acid) GERD > always difficult to exclude as up to 75% of pts in some series report no assoc GI/ Heartburn symptoms and she has a large HH on cxr > rec max (24h)  acid suppression and diet restrictions/ reviewed and instructions given in writing.  - Of the three most common causes of chronic cough, only one (GERD)  can actually cause the other two (asthma and post nasal drip syndrome)  and perpetuate the cylce of cough inducing airway trauma, inflammation, heightened sensitivity to reflux which is  prompted by the cough itself via a cyclical mechanism.    This may partially respond to steroids and look like asthma and post nasal drainage but never erradicated completely unless the cough and the secondary reflux are eliminated, preferably both at the same time.  While not intuitively obvious, many patients with chronic low grade reflux do not cough until there is a secondary insult that disturbs the protective epithelial barrier and exposes sensitive nerve endings.  This can be viral (as may well have been the case here) or direct physical injury such as with an endotracheal tube.   The point is that once this occurs, it is difficult to eliminate using anything but a maximally effective acid suppression regimen at least in the short run, accompanied by an appropriate diet to address non acid GERD.   ? Allergy(less likely with such a low feno on symb 160 2bid) / non-specific airways inflammation > rx prednisone Take 4 for two days three for two days two for two days one for two days   ? Anxiety/depression > usually at the bottom of this list of usual suspects but should be much higher on this pt's based on H and P and may well be interfering in her care based on her responses to questions asked by nursing and by me - see physical exam for examples. Follow up per Primary Care planned    I had an extended discussion with the patient reviewing all relevant studies completed to date and  lasting 15 to 20 minutes  of a 25 minute acute visit with pt not previously known to me   Each maintenance medication was reviewed in detail including most importantly the difference between maintenance and prns and under what circumstances the prns are to be triggered using an action plan format that is not reflected in the computer generated alphabetically organized AVS.    Please see AVS for unique instructions that I personally wrote and verbalized to the the pt in detail and then reviewed with pt  by my nurse  highlighting any  changes in therapy recommended at today's visit to their plan of care.

## 2016-03-13 ENCOUNTER — Telehealth: Payer: Self-pay | Admitting: Pulmonary Disease

## 2016-03-13 MED ORDER — ALBUTEROL SULFATE HFA 108 (90 BASE) MCG/ACT IN AERS: INHALATION_SPRAY | RESPIRATORY_TRACT | 3 refills | Status: DC

## 2016-03-13 MED ORDER — PREDNISONE 10 MG PO TABS: ORAL_TABLET | ORAL | 0 refills | Status: DC

## 2016-03-13 MED ORDER — BUDESONIDE-FORMOTEROL FUMARATE 160-4.5 MCG/ACT IN AERO: 2.0000 | INHALATION_SPRAY | Freq: Two times a day (BID) | RESPIRATORY_TRACT | 3 refills | Status: DC

## 2016-03-13 NOTE — Telephone Encounter (Signed)
Called and spoke with pt and she stated that not being able to VS when she gets into trouble with her breathing is not good for her.  She stated that this last time she was able to be seen by MW and this worked out this time.  She is requesting that prednisone taper be called in for her to keep on hand in case she needs it again---VS please advise.  Thanks  She also requested that the proair and symbicort be sent in and this has been done.

## 2016-03-13 NOTE — Telephone Encounter (Signed)
Called and spoke with pt and she is aware of VS recs. appt scheduled for pt in feb.

## 2016-03-13 NOTE — Telephone Encounter (Signed)
Please send script for prednisone 10 mg pill >> 3 pills daily for 2 days, 2 pills daily for 2 days, 1 pill daily for 2 days.  #12 with no refills.

## 2016-04-03 DIAGNOSIS — B029 Zoster without complications: Secondary | ICD-10-CM | POA: Diagnosis not present

## 2016-04-19 ENCOUNTER — Telehealth: Payer: Self-pay | Admitting: Pulmonary Disease

## 2016-04-19 MED ORDER — OSELTAMIVIR PHOSPHATE 75 MG PO CAPS: 75.0000 mg | ORAL_CAPSULE | Freq: Every day | ORAL | 0 refills | Status: DC

## 2016-04-19 NOTE — Telephone Encounter (Signed)
Please send script for tamiflu 75 mg daily, #7 with no refills for chemoprophylaxis for influenza.

## 2016-04-19 NOTE — Telephone Encounter (Signed)
Spoke with pt. She is aware of VS recommendation. Rx has been sent in per VS. Nothing further was needed right now.

## 2016-04-19 NOTE — Telephone Encounter (Signed)
Spoke with pt. States she is wanting Tamiflu. Her granddaughter that she watches was diagnosed with the flu on Monday. Reports not having any symptoms at this time but she is still recovering from having shingles.  VS - please advise. Thanks.

## 2016-04-23 ENCOUNTER — Ambulatory Visit: Payer: Medicare Other | Admitting: Pulmonary Disease

## 2016-04-27 DIAGNOSIS — Z131 Encounter for screening for diabetes mellitus: Secondary | ICD-10-CM | POA: Diagnosis not present

## 2016-04-27 DIAGNOSIS — E039 Hypothyroidism, unspecified: Secondary | ICD-10-CM | POA: Diagnosis not present

## 2016-04-27 DIAGNOSIS — R7301 Impaired fasting glucose: Secondary | ICD-10-CM | POA: Diagnosis not present

## 2016-04-30 DIAGNOSIS — G8929 Other chronic pain: Secondary | ICD-10-CM | POA: Diagnosis not present

## 2016-04-30 DIAGNOSIS — M545 Low back pain: Secondary | ICD-10-CM | POA: Diagnosis not present

## 2016-05-01 DIAGNOSIS — R7301 Impaired fasting glucose: Secondary | ICD-10-CM | POA: Diagnosis not present

## 2016-05-01 DIAGNOSIS — E063 Autoimmune thyroiditis: Secondary | ICD-10-CM | POA: Diagnosis not present

## 2016-05-01 DIAGNOSIS — E162 Hypoglycemia, unspecified: Secondary | ICD-10-CM | POA: Diagnosis not present

## 2016-05-01 DIAGNOSIS — E039 Hypothyroidism, unspecified: Secondary | ICD-10-CM | POA: Diagnosis not present

## 2016-05-03 DIAGNOSIS — M545 Low back pain: Secondary | ICD-10-CM | POA: Diagnosis not present

## 2016-05-09 DIAGNOSIS — M545 Low back pain: Secondary | ICD-10-CM | POA: Diagnosis not present

## 2016-05-11 DIAGNOSIS — N819 Female genital prolapse, unspecified: Secondary | ICD-10-CM | POA: Diagnosis not present

## 2016-05-11 DIAGNOSIS — R351 Nocturia: Secondary | ICD-10-CM | POA: Diagnosis not present

## 2016-05-11 DIAGNOSIS — Z01419 Encounter for gynecological examination (general) (routine) without abnormal findings: Secondary | ICD-10-CM | POA: Diagnosis not present

## 2016-05-11 DIAGNOSIS — M545 Low back pain: Secondary | ICD-10-CM | POA: Diagnosis not present

## 2016-05-11 DIAGNOSIS — Z6827 Body mass index (BMI) 27.0-27.9, adult: Secondary | ICD-10-CM | POA: Diagnosis not present

## 2016-05-11 DIAGNOSIS — N390 Urinary tract infection, site not specified: Secondary | ICD-10-CM | POA: Diagnosis not present

## 2016-05-14 DIAGNOSIS — M545 Low back pain: Secondary | ICD-10-CM | POA: Diagnosis not present

## 2016-05-17 DIAGNOSIS — M545 Low back pain: Secondary | ICD-10-CM | POA: Diagnosis not present

## 2016-05-23 DIAGNOSIS — Z1231 Encounter for screening mammogram for malignant neoplasm of breast: Secondary | ICD-10-CM | POA: Diagnosis not present

## 2016-05-24 DIAGNOSIS — M545 Low back pain: Secondary | ICD-10-CM | POA: Diagnosis not present

## 2016-05-28 DIAGNOSIS — M545 Low back pain: Secondary | ICD-10-CM | POA: Diagnosis not present

## 2016-05-31 DIAGNOSIS — M545 Low back pain: Secondary | ICD-10-CM | POA: Diagnosis not present

## 2016-06-04 DIAGNOSIS — M545 Low back pain: Secondary | ICD-10-CM | POA: Diagnosis not present

## 2016-06-07 DIAGNOSIS — M545 Low back pain: Secondary | ICD-10-CM | POA: Diagnosis not present

## 2016-06-11 DIAGNOSIS — M545 Low back pain: Secondary | ICD-10-CM | POA: Diagnosis not present

## 2016-06-19 DIAGNOSIS — M545 Low back pain: Secondary | ICD-10-CM | POA: Diagnosis not present

## 2016-06-21 DIAGNOSIS — M545 Low back pain: Secondary | ICD-10-CM | POA: Diagnosis not present

## 2016-06-25 DIAGNOSIS — M545 Low back pain: Secondary | ICD-10-CM | POA: Diagnosis not present

## 2016-06-28 DIAGNOSIS — M545 Low back pain: Secondary | ICD-10-CM | POA: Diagnosis not present

## 2016-07-02 DIAGNOSIS — M545 Low back pain: Secondary | ICD-10-CM | POA: Diagnosis not present

## 2016-07-02 DIAGNOSIS — E039 Hypothyroidism, unspecified: Secondary | ICD-10-CM | POA: Diagnosis not present

## 2016-07-05 DIAGNOSIS — M545 Low back pain: Secondary | ICD-10-CM | POA: Diagnosis not present

## 2016-07-09 DIAGNOSIS — M545 Low back pain: Secondary | ICD-10-CM | POA: Diagnosis not present

## 2016-07-13 ENCOUNTER — Encounter (HOSPITAL_COMMUNITY): Payer: Self-pay | Admitting: Emergency Medicine

## 2016-07-13 ENCOUNTER — Inpatient Hospital Stay (HOSPITAL_COMMUNITY)
Admission: EM | Admit: 2016-07-13 | Discharge: 2016-07-16 | DRG: 247 | Disposition: A | Discharge status: Home / Self Care | Payer: Medicare Other | Attending: Cardiovascular Disease | Admitting: Cardiovascular Disease

## 2016-07-13 ENCOUNTER — Emergency Department (HOSPITAL_COMMUNITY): Payer: Medicare Other

## 2016-07-13 DIAGNOSIS — J449 Chronic obstructive pulmonary disease, unspecified: Secondary | ICD-10-CM | POA: Diagnosis not present

## 2016-07-13 DIAGNOSIS — Z7951 Long term (current) use of inhaled steroids: Secondary | ICD-10-CM

## 2016-07-13 DIAGNOSIS — I214 Non-ST elevation (NSTEMI) myocardial infarction: Secondary | ICD-10-CM | POA: Diagnosis not present

## 2016-07-13 DIAGNOSIS — E559 Vitamin D deficiency, unspecified: Secondary | ICD-10-CM | POA: Diagnosis not present

## 2016-07-13 DIAGNOSIS — R0602 Shortness of breath: Secondary | ICD-10-CM | POA: Diagnosis not present

## 2016-07-13 DIAGNOSIS — R079 Chest pain, unspecified: Secondary | ICD-10-CM | POA: Diagnosis not present

## 2016-07-13 DIAGNOSIS — E785 Hyperlipidemia, unspecified: Secondary | ICD-10-CM | POA: Diagnosis present

## 2016-07-13 DIAGNOSIS — Z955 Presence of coronary angioplasty implant and graft: Secondary | ICD-10-CM | POA: Diagnosis not present

## 2016-07-13 DIAGNOSIS — Z91041 Radiographic dye allergy status: Secondary | ICD-10-CM

## 2016-07-13 DIAGNOSIS — I2510 Atherosclerotic heart disease of native coronary artery without angina pectoris: Secondary | ICD-10-CM

## 2016-07-13 DIAGNOSIS — E039 Hypothyroidism, unspecified: Secondary | ICD-10-CM | POA: Diagnosis present

## 2016-07-13 DIAGNOSIS — I340 Nonrheumatic mitral (valve) insufficiency: Secondary | ICD-10-CM | POA: Diagnosis not present

## 2016-07-13 DIAGNOSIS — Z79899 Other long term (current) drug therapy: Secondary | ICD-10-CM

## 2016-07-13 DIAGNOSIS — R072 Precordial pain: Secondary | ICD-10-CM | POA: Diagnosis not present

## 2016-07-13 DIAGNOSIS — Z91040 Latex allergy status: Secondary | ICD-10-CM | POA: Diagnosis not present

## 2016-07-13 DIAGNOSIS — R0789 Other chest pain: Secondary | ICD-10-CM | POA: Diagnosis not present

## 2016-07-13 DIAGNOSIS — K219 Gastro-esophageal reflux disease without esophagitis: Secondary | ICD-10-CM | POA: Diagnosis present

## 2016-07-13 DIAGNOSIS — J4489 Other specified chronic obstructive pulmonary disease: Secondary | ICD-10-CM | POA: Diagnosis present

## 2016-07-13 DIAGNOSIS — K449 Diaphragmatic hernia without obstruction or gangrene: Secondary | ICD-10-CM | POA: Diagnosis not present

## 2016-07-13 DIAGNOSIS — Z9861 Coronary angioplasty status: Secondary | ICD-10-CM

## 2016-07-13 DIAGNOSIS — Z888 Allergy status to other drugs, medicaments and biological substances status: Secondary | ICD-10-CM

## 2016-07-13 DIAGNOSIS — M1990 Unspecified osteoarthritis, unspecified site: Secondary | ICD-10-CM | POA: Diagnosis present

## 2016-07-13 DIAGNOSIS — I10 Essential (primary) hypertension: Secondary | ICD-10-CM | POA: Diagnosis not present

## 2016-07-13 DIAGNOSIS — E782 Mixed hyperlipidemia: Secondary | ICD-10-CM | POA: Diagnosis not present

## 2016-07-13 HISTORY — DX: Pneumonia, unspecified organism: J18.9

## 2016-07-13 LAB — CBC
HCT: 35.3 % — ABNORMAL LOW (ref 36.0–46.0)
Hemoglobin: 11.6 g/dL — ABNORMAL LOW (ref 12.0–15.0)
MCH: 26.7 pg (ref 26.0–34.0)
MCHC: 32.9 g/dL (ref 30.0–36.0)
MCV: 81.3 fL (ref 78.0–100.0)
PLATELETS: 154 10*3/uL (ref 150–400)
RBC: 4.34 MIL/uL (ref 3.87–5.11)
RDW: 16.2 % — ABNORMAL HIGH (ref 11.5–15.5)
WBC: 8 10*3/uL (ref 4.0–10.5)

## 2016-07-13 LAB — BASIC METABOLIC PANEL
Anion gap: 8 (ref 5–15)
BUN: 14 mg/dL (ref 6–20)
CALCIUM: 9.2 mg/dL (ref 8.9–10.3)
CO2: 24 mmol/L (ref 22–32)
CREATININE: 0.86 mg/dL (ref 0.44–1.00)
Chloride: 107 mmol/L (ref 101–111)
GFR calc Af Amer: >60 mL/min (ref >60)
GFR calc non Af Amer: >60 mL/min (ref >60)
GLUCOSE: 107 mg/dL — AB (ref 65–99)
Potassium: 4 mmol/L (ref 3.5–5.1)
Sodium: 139 mmol/L (ref 135–145)

## 2016-07-13 LAB — I-STAT TROPONIN, ED: TROPONIN I, POC: 0.17 ng/mL — AB (ref 0.00–0.08)

## 2016-07-13 MED ORDER — LEVOTHYROXINE SODIUM 25 MCG PO TABS
137.0000 ug | ORAL_TABLET | Freq: Every day | ORAL | Status: DC
  Administered 2016-07-14 – 2016-07-16 (×3): 137 ug via ORAL
  Filled 2016-07-13 (×3): qty 1

## 2016-07-13 MED ORDER — NITROGLYCERIN IN D5W 200-5 MCG/ML-% IV SOLN
10.0000 ug/min | INTRAVENOUS | Status: DC
  Administered 2016-07-13: 10 ug/min via INTRAVENOUS
  Filled 2016-07-13: qty 250

## 2016-07-13 MED ORDER — ASPIRIN EC 81 MG PO TBEC
81.0000 mg | DELAYED_RELEASE_TABLET | Freq: Every day | ORAL | Status: DC
  Administered 2016-07-14 – 2016-07-16 (×3): 81 mg via ORAL
  Filled 2016-07-13 (×3): qty 1

## 2016-07-13 MED ORDER — HEPARIN BOLUS VIA INFUSION
4000.0000 [IU] | Freq: Once | INTRAVENOUS | Status: AC
  Administered 2016-07-13: 4000 [IU] via INTRAVENOUS
  Filled 2016-07-13: qty 4000

## 2016-07-13 MED ORDER — NITROGLYCERIN 0.4 MG SL SUBL
0.4000 mg | SUBLINGUAL_TABLET | SUBLINGUAL | Status: AC | PRN
  Administered 2016-07-14 – 2016-07-15 (×3): 0.4 mg via SUBLINGUAL
  Filled 2016-07-13 (×3): qty 1

## 2016-07-13 MED ORDER — SODIUM CHLORIDE 0.9% FLUSH
3.0000 mL | Freq: Two times a day (BID) | INTRAVENOUS | Status: DC
  Administered 2016-07-14 – 2016-07-16 (×4): 3 mL via INTRAVENOUS

## 2016-07-13 MED ORDER — ATORVASTATIN CALCIUM 80 MG PO TABS
80.0000 mg | ORAL_TABLET | Freq: Every day | ORAL | Status: DC
  Administered 2016-07-14 – 2016-07-15 (×2): 80 mg via ORAL
  Filled 2016-07-13 (×2): qty 1

## 2016-07-13 MED ORDER — HEPARIN (PORCINE) IN NACL 100-0.45 UNIT/ML-% IJ SOLN
900.0000 [IU]/h | INTRAMUSCULAR | Status: DC
  Administered 2016-07-13 – 2016-07-14 (×2): 900 [IU]/h via INTRAVENOUS
  Filled 2016-07-13 (×2): qty 250

## 2016-07-13 MED ORDER — PANTOPRAZOLE SODIUM 40 MG PO TBEC
40.0000 mg | DELAYED_RELEASE_TABLET | Freq: Every day | ORAL | Status: DC
  Administered 2016-07-14 – 2016-07-16 (×3): 40 mg via ORAL
  Filled 2016-07-13 (×4): qty 1

## 2016-07-13 MED ORDER — OSELTAMIVIR PHOSPHATE 75 MG PO CAPS
75.0000 mg | ORAL_CAPSULE | Freq: Every day | ORAL | Status: DC
  Filled 2016-07-13: qty 1

## 2016-07-13 MED ORDER — ALBUTEROL SULFATE (2.5 MG/3ML) 0.083% IN NEBU
2.5000 mg | INHALATION_SOLUTION | Freq: Four times a day (QID) | RESPIRATORY_TRACT | Status: DC | PRN
  Administered 2016-07-13: 2.5 mg via RESPIRATORY_TRACT
  Filled 2016-07-13: qty 3

## 2016-07-13 NOTE — H&P (Signed)
CARDIOLOGY INPATIENT HISTORY AND PHYSICAL EXAMINATION NOTE  Patient ID: Sonia [name] MRN: [id number], DOB/AGE: Feb 23, 1941   Admit date: 07/13/2016   Primary Physician: [name], MD Primary Cardiologist: new  Reason for admission: chest pain  HPI: This is a 76 y.o.white female with no prior history of CAD but history of asthma/COPD, exposed to 2nd smoking, recurrent pneumonia (last episode 76 y ago), hiatal hernia presenting with epigastric pain and misternal chest pain.  Symptoms started yesterday, associated with nausea, fatigue, headache, neck pain and numbness radiating to the left arm. Symptoms were intermittent and would last for 3- 10 minutes. Never had sx before. No exertional angina, CHF symptoms, PE/DVT hemoptysis. Ongonig dyspnea for several days but attributes to allergy season. On arrival, her istat trop was 0.17. She had another episode of CP in the ED. She is Therapist, sports by profession. NTG improved the pain.   Problem List: Past Medical History:  Diagnosis Date  . Arthritis   . Asthma    daily and prn inhalers; states good control currently  . COPD (chronic obstructive pulmonary disease) ([place])   . GERD (gastroesophageal reflux disease)    no current med.  . Hashimoto's thyroiditis   . Headache    allergies; silent migraines  . Hiatal hernia   . Hypothyroidism   . 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 Vitamin D deficiency     Past Surgical History:  Procedure Laterality Date  . ABDOMINAL HYSTERECTOMY     partial  . APPENDECTOMY    . BREAST CYST EXCISION Left   . BREAST LUMPECTOMY WITH RADIOACTIVE SEED LOCALIZATION Left 04/07/2015   Procedure: BREAST LUMPECTOMY WITH RADIOACTIVE SEED LOCALIZATION;  Surgeon: Erroll Luna, MD;  Location: [facility];  Service: General;  Laterality: Left;  . KNEE ARTHROSCOPY Right 12/07/2003  . LAPAROSCOPIC SALPINGO OOPHERECTOMY Right 09/08/2003  . LYSIS OF ADHESION  09/08/2003  . NASAL SINUS SURGERY  age 58  .  RECTAL POLYPECTOMY  10/02/2006     Allergies:  Allergies  Allergen Reactions  . Adenosine Shortness Of Breath  . Contrast Media [Iodinated Diagnostic Agents] Anaphylaxis  . Codeine Sulfate Nausea And Vomiting  . Latex Other (See Comments)    Skin irritation  . Adhesive [Tape] Other (See Comments)    SKIN IRRITATION     Home Medications Current Facility-Administered Medications  Medication Dose Route Frequency Provider Last Rate Last Dose  . albuterol (PROVENTIL) (2.5 MG/3ML) 0.083% nebulizer solution 2.5 mg  2.5 mg Nebulization Q6H PRN Geralynn Ochs T, MD   2.5 mg at 07/13/16 2147  . aspirin EC tablet 81 mg  81 mg Oral Daily Geralynn Ochs T, MD      . atorvastatin (LIPITOR) tablet 80 mg  80 mg Oral q1800 Geralynn Ochs T, MD      . heparin ADULT infusion 100 units/mL (25000 units/278mL sodium chloride 0.45%)  900 Units/hr Intravenous Continuous Rumbarger, Valeda Malm, RPH 9 mL/hr at 07/13/16 2127 900 Units/hr at 07/13/16 2127  . levothyroxine (SYNTHROID, LEVOTHROID) tablet 137 mcg  137 mcg Oral QAC breakfast Geralynn Ochs T, MD      . nitroGLYCERIN (NITROSTAT) SL tablet 0.4 mg  0.4 mg Sublingual Q5 min PRN Elnora Morrison, MD      . nitroGLYCERIN 50 mg in dextrose 5 % 250 mL (0.2 mg/mL) infusion  10-200 mcg/min Intravenous Titrated Elnora Morrison, MD 3 mL/hr at 07/13/16 2126 10 mcg/min at 07/13/16 2126  . oseltamivir (TAMIFLU) capsule 75 mg  75 mg Oral Daily Geralynn Ochs  T, MD      . pantoprazole (PROTONIX) EC tablet 40 mg  40 mg Oral QAC breakfast Geralynn Ochs T, MD      . sodium chloride flush (NS) 0.9 % injection 3 mL  3 mL Intravenous Q12H Flossie Dibble, MD         Family History  Problem Relation Age of Onset  . Asthma Mother   . Heart disease Mother   . Asthma Sister   . Heart disease Sister   . Breast cancer Sister 23  . Heart disease Father   . Kidney disease Father   . Prostate cancer Brother     dx. 53s  . Heart attack Brother 44  . Alzheimer's disease  Brother 29  . Breast cancer Maternal Aunt 70  . Stomach cancer Maternal Grandfather 60  . Asthma Sister   . Memory loss Sister   . Heart attack Son 43  . Spinal muscular atrophy Grandchild     type II  . Cancer Other 72    niece dx. ca of salivary gland   . Breast cancer Other 47    niece; negative genetic testing 10 years ago  . Melanoma Other   . Alzheimer's disease Maternal Aunt   . Cervical cancer Cousin     maternal 1st cousin dx. at 54  . Breast cancer Cousin 62    maternal 1st cousin  . Lung cancer Cousin 49    maternal 1st cousin; smoker  . Heart disease Cousin   . Leukemia Cousin 14    paternal 1st cousin  . Breast cancer Other     paternal great grandmother (PGF's mother) dx. late 83s-early 34s  . Coronary artery disease       Social History   Social History  . Marital status: Divorced    Spouse name: N/A  . Number of children: 2  . Years of education: N/A   Occupational History  . 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 Smoking status: Never Smoker  . Smokeless tobacco: Never Used  . Alcohol use Yes     Comment: rarely  . Drug use: No  . Sexual activity: Not on file   Other Topics Concern  . Not on file   Social History Narrative  . No narrative on file     Review of Systems: General: negative for chills, fever, night sweats or weight changes.  Cardiovascular: chest pain, dyspnea  Dermatological: negative for rash Respiratory: wheezing negative for cough  Urologic: negative for hematuria Abdominal: nausea negative for vomiting, diarrhea, bright red blood per rectum, melena, or hematemesis Neurologic: negative for visual changes, syncope, or dizziness Endocrine: no diabetes, no hypothyroidism Immunological: no lymph adenopathy Psych: non homicidal/suicidal  Physical Exam: Vitals: BP (!) 118/98 (BP Location: Left Arm)   Pulse 75   Temp 98.2 F (36.8 C) (Oral)   Resp 19   Ht 5' 4.57"  (1.64 m)   Wt 70.3 kg (154 lb 14.4 oz)   SpO2 98%   BMI 26.12 kg/m  General: not in acute distress Neck: JVP flat, neck supple Heart: regular rate and rhythm, S1, S2, no murmurs  Lungs: CTAB  GI: non tender, non distended, bowel sounds present Extremities: no edema Neuro: AAO x 3  Psych: normal affect, no anxiety   Labs:   Results for orders placed or performed during the hospital encounter of 07/13/16 (from the past 24 hour(s))  Basic metabolic panel     Status: Abnormal   Collection Time: 07/13/16  6:41 PM  Result Value Ref Range   Sodium 139 135 - 145 mmol/L   Potassium 4.0 3.5 - 5.1 mmol/L   Chloride 107 101 - 111 mmol/L   CO2 24 22 - 32 mmol/L   Glucose, Bld 107 (H) 65 - 99 mg/dL   BUN 14 6 - 20 mg/dL   Creatinine, Ser 0.86 0.44 - 1.00 mg/dL   Calcium 9.2 8.9 - 10.3 mg/dL   GFR calc non Af Amer >60 >60 mL/min   GFR calc Af Amer >60 >60 mL/min   Anion gap 8 5 - 15  CBC     Status: Abnormal   Collection Time: 07/13/16  6:41 PM  Result Value Ref Range   WBC 8.0 4.0 - 10.5 K/uL   RBC 4.34 3.87 - 5.11 MIL/uL   Hemoglobin 11.6 (L) 12.0 - 15.0 g/dL   HCT 35.3 (L) 36.0 - 46.0 %   MCV 81.3 78.0 - 100.0 fL   MCH 26.7 26.0 - 34.0 pg   MCHC 32.9 30.0 - 36.0 g/dL   RDW 16.2 (H) 11.5 - 15.5 %   Platelets 154 150 - 400 K/uL  I-stat troponin, ED     Status: Abnormal   Collection Time: 07/13/16  7:01 PM  Result Value Ref Range   Troponin i, poc 0.17 (HH) 0.00 - 0.08 ng/mL   Comment NOTIFIED PHYSICIAN    Comment 3             Radiology/Studies: Dg Chest 2 View  Result Date: 07/13/2016 CLINICAL DATA:  76 year old female with history of pressure in the chest radiating down into both arms (left greater than right) since this morning. Shortness of breath. History of COPD. EXAM: CHEST  2 VIEW COMPARISON:  Chest x-ray a 03/07/2016. FINDINGS: Lung volumes are normal. No consolidative airspace disease. No pleural effusions. No pneumothorax. No pulmonary nodule or mass noted. Large  hiatal hernia predominantly to the right of midline redemonstrated. Pulmonary vasculature and the cardiomediastinal silhouette are otherwise within normal limits. Atherosclerosis in the thoracic aorta. IMPRESSION: 1. No radiographic evidence of acute cardiopulmonary disease. 2. Aortic atherosclerosis. 3. Large hiatal hernia. Electronically Signed   By: Vinnie Langton M.D.   On: 07/13/2016 19:31    EKG: normal sinus rhythm with ST depression in the inferior leads  Echo: 10/30/2010 Left ventricle: The cavity size was normal. Wall thickness was normal. Systolic function was normal. The estimated ejection fraction was in the range of 55% to 60%. Wall motion was normal; there were no regional wall motion abnormalities. Features are consistent with a pseudonormal left ventricular filling pattern, with concomitant abnormal relaxation and increased filling pressure (grade 2 diastolic dysfunction).  Transthoracic echocardiography. M-mode, complete 2D, spectral Doppler, and color Doppler. Height: Height: 165.1cm. Height: 65in. Weight: Weight: 69.9kg. Weight: 153.7lb. Body mass index: BMI: 25.6kg/m^2. Body surface area: BSA: 1.33m^2. Blood pressure: 128/78. Patient status: Outpatient. Location: [facility]   Cardiac cath: none  Medical decision making:  Discussed care with the patient Discussed care with the ED physician on the phone Reviewed labs and imaging personally Reviewed prior records  ASSESSMENT AND PLAN:  This is a 76 y.o. WF with typical chest pain and troponin of 0.17 w/o prior CAD history presented to ED.    Active Problems:   NSTEMI (non-ST elevated myocardial infarction) (HCC)  NSTEMI Cycle troponin, serial EKGs prn, lipid panel, TSH, HbA1c, echocardiogram in the AM  IV heparin, aspirin, high dose statin (lipitor 80 mg daily), Consider cardiac catheterization, allergic to contrast (anaphylaxis) and adenosine. Will need prep for it prior to cath  COPD/asthma - not in  exacerbation - continue inhalers      Signed, Flossie Dibble, MD MS 07/14/2016, 12:00 AM

## 2016-07-13 NOTE — Progress Notes (Signed)
ANTICOAGULATION CONSULT NOTE - Initial Consult  Pharmacy Consult for heparin Indication: chest pain/ACS  Allergies  Allergen Reactions  . Adenosine Shortness Of Breath  . Contrast Media [Iodinated Diagnostic Agents] Anaphylaxis  . Codeine Sulfate Nausea And Vomiting  . Latex   . Adhesive [Tape] Other (See Comments)    SKIN IRRITATION    Patient Measurements: Height: 5' 4.57" (164 cm) Weight: 160 lb 11.5 oz (72.9 kg) IBW/kg (Calculated) : 56 Heparin Dosing Weight: 70.9kg  Vital Signs: Temp: 97.7 F (36.5 C) (05/04 1844) Temp Source: Oral (05/04 1844) BP: 140/78 (05/04 2030) Pulse Rate: 71 (05/04 2030)  Labs:  Recent Labs  07/13/16 1841  HGB 11.6*  HCT 35.3*  PLT 154  CREATININE 0.86    Estimated Creatinine Clearance: 54.3 mL/min (by C-G formula based on SCr of 0.86 mg/dL).   Medical History: Past Medical History:  Diagnosis Date  . Arthritis   . Asthma    daily and prn inhalers; states good control currently  . COPD (chronic obstructive pulmonary disease) ([place])   . GERD (gastroesophageal reflux disease)    no current med.  . Hashimoto's thyroiditis   . Headache    allergies; silent migraines  . Hiatal hernia   . Hypothyroidism   . 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 Vitamin D deficiency     Medications:  Infusions:  . heparin    . nitroGLYCERIN      Assessment: 8 yof presented to the ED with CP radiating to the L arm. Troponin found to be elevated and now starting IV heparin. H/H is slightly low but platelets are WNL. She is not on anticoagulation PTA.    Goal of Therapy:  Heparin level 0.3-0.7 units/ml Monitor platelets by anticoagulation protocol: Yes   Plan:  Heparin bolus 4000 units IV x 1  Heparin gtt 900 units/hr Check an 8 hr heparin level Daily heparin level and CBC  Herberta Pickron, Rande Lawman 07/13/2016,9:21 PM

## 2016-07-13 NOTE — ED Notes (Signed)
Patient transported to xray via stretcher.

## 2016-07-13 NOTE — ED Notes (Signed)
Attempted report at this time.  Nurse to call back when available. 

## 2016-07-13 NOTE — ED Provider Notes (Signed)
[facility] DEPT Provider Note   CSN: [id number] Arrival date & time: 07/13/16  1834     History   Chief Complaint Chief Complaint  Patient presents with  . Chest Pain    HPI Sonia [name] is a 76 y.o. female.  Patient presents from primary care office for further evaluation of intermittent chest pain and pressure with radiation to left arm that started this morning. Patient is mild shortness of breath as well. Patient has COPD history controlled not on home oxygen. Patient denies known heart disease however significant family history. Patient stroke and aspirin that resolved her symptoms prior to arrival. No history of similar. Patient had stress test approximately 5-6 years ago.      Past Medical History:  Diagnosis Date  . Arthritis   . Asthma    daily and prn inhalers; states good control currently  . COPD (chronic obstructive pulmonary disease) ([place])   . GERD (gastroesophageal reflux disease)    no current med.  . Hashimoto's thyroiditis   . Headache    allergies; silent migraines  . Hiatal hernia   . Hypothyroidism   . 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 Vitamin D deficiency     Patient Active Problem List   Diagnosis Date Noted  . NSTEMI (non-ST elevated myocardial infarction) ([place]) 07/13/2016  . Severe asthma with acute exacerbation 03/07/2016  . Genetic testing 06/09/2015  . Family history of breast cancer in female 05/10/2015  . History of melanoma 05/10/2015  . Atypical lobular hyperplasia of left breast 04/26/2015  . Vision problem 01/08/2014  . Hiatus hernia syndrome 08/01/2012  . Palpitations 10/18/2010  . Chest pain 10/18/2010  . Hypothyroidism 10/18/2010  . HYPERLIPIDEMIA 06/07/2009  . Upper airway cough syndrome 03/29/2009  . COPD with asthma ([place]) 12/05/2006    Past Surgical History:  Procedure Laterality Date  . ABDOMINAL HYSTERECTOMY     partial  . APPENDECTOMY    . BREAST CYST EXCISION Left   . BREAST LUMPECTOMY WITH  RADIOACTIVE SEED LOCALIZATION Left 04/07/2015   Procedure: BREAST LUMPECTOMY WITH RADIOACTIVE SEED LOCALIZATION;  Surgeon: Erroll Luna, MD;  Location: [facility];  Service: General;  Laterality: Left;  . KNEE ARTHROSCOPY Right 12/07/2003  . LAPAROSCOPIC SALPINGO OOPHERECTOMY Right 09/08/2003  . LYSIS OF ADHESION  09/08/2003  . NASAL SINUS SURGERY  age 49  . RECTAL POLYPECTOMY  10/02/2006    OB History    No data available       Home Medications    Prior to Admission medications   Medication Sig Start Date End Date Taking? Authorizing Provider  albuterol (PROAIR HFA) 108 (90 Base) MCG/ACT inhaler INHALE TWO PUFFS INTO LUNGS EVERY 6 HOURS AS NEEDED 03/13/16   Chesley Mires, MD  albuterol (PROVENTIL) (2.5 MG/3ML) 0.083% nebulizer solution Take 3 mLs (2.5 mg total) by nebulization every 6 (six) hours as needed for wheezing or shortness of breath. 03/07/16   Tanda Rockers, MD  budesonide-formoterol [facility]) 160-4.5 MCG/ACT inhaler Inhale 2 puffs into the lungs 2 (two) times daily. 03/13/16   Chesley Mires, MD  famotidine (PEPCID) 20 MG tablet One at bedtime 03/07/16   Tanda Rockers, MD  oseltamivir (TAMIFLU) 75 MG capsule Take 1 capsule (75 mg total) by mouth daily. 04/19/16   Chesley Mires, MD  pantoprazole (PROTONIX) 40 MG tablet Take 1 tablet (40 mg total) by mouth daily. Take 30-60 min before first meal of the day 03/07/16   Tanda Rockers, MD  predniSONE (DELTASONE) 10  MG tablet Take  4 each am x 2 days,   2 each am x 2 days,  1 each am x 2 days and stop 03/07/16   Tanda Rockers, MD  predniSONE (DELTASONE) 10 MG tablet Take  4 each am x 2 days,   2 each am x 2 days,  1 each am x 2 days and stop 03/07/16   Tanda Rockers, MD  predniSONE (DELTASONE) 10 MG tablet Take 3 tabs x 2 days, 2 tabs x 2 days, 1 tab x 2 days then stop. 03/13/16   Chesley Mires, MD  SYNTHROID 137 MCG tablet 1 tab by mouth Monday through Friday 12/01/14   Historical Provider, MD    Family History Family History    Problem Relation Age of Onset  . Asthma Mother   . Heart disease Mother   . Asthma Sister   . Heart disease Sister   . Breast cancer Sister 84  . Heart disease Father   . Kidney disease Father   . Prostate cancer Brother     dx. 36s  . Heart attack Brother 61  . Alzheimer's disease Brother 64  . Breast cancer Maternal Aunt 70  . Stomach cancer Maternal Grandfather 60  . Asthma Sister   . Memory loss Sister   . Heart attack Son 33  . Spinal muscular atrophy Grandchild     type II  . Cancer Other 14    niece dx. ca of salivary gland   . Breast cancer Other 25    niece; negative genetic testing 10 years ago  . Melanoma Other   . Alzheimer's disease Maternal Aunt   . Cervical cancer Cousin     maternal 1st cousin dx. at 69  . Breast cancer Cousin 58    maternal 1st cousin  . Lung cancer Cousin 86    maternal 1st cousin; smoker  . Heart disease Cousin   . Leukemia Cousin 14    paternal 1st cousin  . Breast cancer Other     paternal great grandmother (PGF's mother) dx. late 34s-early 80s  . Coronary artery disease      Social History Social History  Substance Use Topics  . Smoking status: Never Smoker  . Smokeless tobacco: Never Used  . Alcohol use Yes     Comment: rarely     Allergies   Adenosine; Contrast media [iodinated diagnostic agents]; Codeine sulfate; Latex; and Adhesive [tape]   Review of Systems Review of Systems  Constitutional: Negative for chills and fever.  HENT: Negative for congestion.   Eyes: Negative for visual disturbance.  Respiratory: Positive for shortness of breath.   Cardiovascular: Positive for chest pain. Negative for leg swelling.  Gastrointestinal: Negative for abdominal pain and vomiting.  Genitourinary: Negative for dysuria and flank pain.  Musculoskeletal: Negative for back pain, neck pain and neck stiffness.  Skin: Negative for rash.  Neurological: Positive for dizziness. Negative for light-headedness and headaches.      Physical Exam Updated Vital Signs BP 140/78   Pulse 71   Temp 97.7 F (36.5 C) (Oral)   Resp 13   Ht 5' 4.57" (1.64 m)   Wt 160 lb 11.5 oz (72.9 kg)   SpO2 99%   BMI 27.10 kg/m   Physical Exam  Constitutional: She is oriented to person, place, and time. She appears well-developed and well-nourished.  HENT:  Head: Normocephalic and atraumatic.  Eyes: Conjunctivae are normal. Right eye exhibits no discharge. Left eye exhibits no discharge.  Neck: Normal range of motion. Neck supple. No tracheal deviation present.  Cardiovascular: Normal rate and regular rhythm.   Pulmonary/Chest: Effort normal and breath sounds normal.  Abdominal: Soft. She exhibits no distension. There is no tenderness. There is no guarding.  Musculoskeletal: She exhibits no edema.  Neurological: She is alert and oriented to person, place, and time.  Skin: Skin is warm. No rash noted.  Psychiatric: She has a normal mood and affect.  Nursing note and vitals reviewed.    ED Treatments / Results  Labs (all labs ordered are listed, but only abnormal results are displayed) Labs Reviewed  BASIC METABOLIC PANEL - Abnormal; Notable for the following:       Result Value   Glucose, Bld 107 (*)    All other components within normal limits  CBC - Abnormal; Notable for the following:    Hemoglobin 11.6 (*)    HCT 35.3 (*)    RDW 16.2 (*)    All other components within normal limits  I-STAT TROPOININ, ED - Abnormal; Notable for the following:    Troponin i, poc 0.17 (*)    All other components within normal limits  HEPARIN LEVEL (UNFRACTIONATED)  CBC    EKG  EKG Interpretation  Date/Time:  Friday Jul 13 2016 18:41:24 EDT Ventricular Rate:  76 PR Interval:    QRS Duration: 97 QT Interval:  398 QTC Calculation: 448 R Axis:   87 Text Interpretation:  Sinus rhythm Consider left atrial enlargement Consider right ventricular hypertrophy Confirmed by Reather Converse MD, Vonna Kotyk [phone]) on 07/13/2016 7:09:03 PM        Radiology Dg Chest 2 View  Result Date: 07/13/2016 CLINICAL DATA:  76 year old female with history of pressure in the chest radiating down into both arms (left greater than right) since this morning. Shortness of breath. History of COPD. EXAM: CHEST  2 VIEW COMPARISON:  Chest x-ray a 03/07/2016. FINDINGS: Lung volumes are normal. No consolidative airspace disease. No pleural effusions. No pneumothorax. No pulmonary nodule or mass noted. Large hiatal hernia predominantly to the right of midline redemonstrated. Pulmonary vasculature and the cardiomediastinal silhouette are otherwise within normal limits. Atherosclerosis in the thoracic aorta. IMPRESSION: 1. No radiographic evidence of acute cardiopulmonary disease. 2. Aortic atherosclerosis. 3. Large hiatal hernia. Electronically Signed   By: Vinnie Langton M.D.   On: 07/13/2016 19:31    Procedures Procedures (including critical care time) CRITICAL CARE Performed by: Mariea Clonts   Total critical care time: 35 minutes  Critical care time was exclusive of separately billable procedures and treating other patients.  Critical care was necessary to treat or prevent imminent or life-threatening deterioration.  Critical care was time spent personally by me on the following activities: development of treatment plan with patient and/or surrogate as well as nursing, discussions with consultants, evaluation of patient's response to treatment, examination of patient, obtaining history from patient or surrogate, ordering and performing treatments and interventions, ordering and review of laboratory studies, ordering and review of radiographic studies, pulse oximetry and re-evaluation of patient's condition.  Medications Ordered in ED Medications  nitroGLYCERIN (NITROSTAT) SL tablet 0.4 mg (not administered)  heparin bolus via infusion 4,000 Units (not administered)  heparin ADULT infusion 100 units/mL (25000 units/226mL sodium chloride 0.45%)  (not administered)  nitroGLYCERIN 50 mg in dextrose 5 % 250 mL (0.2 mg/mL) infusion (not administered)     Initial Impression / Assessment and Plan / ED Course  I have reviewed the triage vital signs and the nursing notes.  Pertinent labs & imaging results that were available during my care of the patient were reviewed by me and considered in my medical decision making (see chart for details).    Patient presents with concern for angina.  Initially chest pain-free. Patient cardiology. Repeat age cardiology discussed with cardiology fellow concerns for ACS. Patient started having recurrent chest pain EKG showed worsening diffuse mild ST depression. Patient had aspirin. Heparin ordered. Plan for admission to cardiology.  The patients results and plan were reviewed and discussed.   Any x-rays performed were independently reviewed by myself.   Differential diagnosis were considered with the presenting HPI.  Medications  nitroGLYCERIN (NITROSTAT) SL tablet 0.4 mg (not administered)  heparin bolus via infusion 4,000 Units (not administered)  heparin ADULT infusion 100 units/mL (25000 units/244mL sodium chloride 0.45%) (not administered)  nitroGLYCERIN 50 mg in dextrose 5 % 250 mL (0.2 mg/mL) infusion (not administered)    Vitals:   07/13/16 2000 07/13/16 2015 07/13/16 2030 07/13/16 2100  BP:  (!) 150/81 140/78   Pulse: 78 79 71   Resp: 16 17 13    Temp:      TempSrc:      SpO2: 98% 99% 99%   Weight:    160 lb 11.5 oz (72.9 kg)  Height:    5' 4.57" (1.64 m)    Final diagnoses:  NSTEMI (non-ST elevated myocardial infarction) ([place])  Acute chest pain    Admission/ observation were discussed with the admitting physician, patient and/or family and they are comfortable with the plan.    Final Clinical Impressions(s) / ED Diagnoses   Final diagnoses:  NSTEMI (non-ST elevated myocardial infarction) ([place])  Acute chest pain    New Prescriptions New Prescriptions   No medications  on file     Elnora Morrison, MD 07/13/16 2124

## 2016-07-13 NOTE — ED Triage Notes (Signed)
Per [facility]: Pt to ED from PCP office for further evaluation of intermittent CP radiating to L arm starting this morning with SOB upon movement. Pt reports decreased pain at this time - was given 1 NTG and 324 ASA at PCP. 18g. RAC inserted PTA. Pt hx COPD, no oxygen at home, but on 2L O2 for PVCs, according to EMS. Pt in no signs of distress, resp e/u. EMS VS: 125/78, HR 80 NSR, 99% 2L. Pt denies dizziness/N/V/syncope.

## 2016-07-14 ENCOUNTER — Encounter (HOSPITAL_COMMUNITY): Payer: Self-pay | Admitting: Internal Medicine

## 2016-07-14 LAB — CBC
HCT: 34.9 % — ABNORMAL LOW (ref 36.0–46.0)
Hemoglobin: 11.3 g/dL — ABNORMAL LOW (ref 12.0–15.0)
MCH: 26.5 pg (ref 26.0–34.0)
MCHC: 32.4 g/dL (ref 30.0–36.0)
MCV: 81.9 fL (ref 78.0–100.0)
PLATELETS: 165 10*3/uL (ref 150–400)
RBC: 4.26 MIL/uL (ref 3.87–5.11)
RDW: 16.2 % — AB (ref 11.5–15.5)
WBC: 8.5 10*3/uL (ref 4.0–10.5)

## 2016-07-14 LAB — LIPID PANEL
CHOL/HDL RATIO: 3 ratio
Cholesterol: 227 mg/dL — ABNORMAL HIGH (ref 0–200)
HDL: 76 mg/dL (ref >40)
LDL Cholesterol: 139 mg/dL — ABNORMAL HIGH (ref 0–99)
Triglycerides: 60 mg/dL (ref <150)
VLDL: 12 mg/dL (ref 0–40)

## 2016-07-14 LAB — TROPONIN I
TROPONIN I: 0.47 ng/mL — AB (ref <0.03)
Troponin I: 0.37 ng/mL (ref <0.03)
Troponin I: 0.23 ng/mL (ref <0.03)

## 2016-07-14 LAB — BRAIN NATRIURETIC PEPTIDE: B Natriuretic Peptide: 202.9 pg/mL — ABNORMAL HIGH (ref 0.0–100.0)

## 2016-07-14 LAB — HEPARIN LEVEL (UNFRACTIONATED)
HEPARIN UNFRACTIONATED: 0.46 [IU]/mL (ref 0.30–0.70)
Heparin Unfractionated: 0.51 IU/mL (ref 0.30–0.70)

## 2016-07-14 LAB — COMPREHENSIVE METABOLIC PANEL
ALBUMIN: 3.2 g/dL — AB (ref 3.5–5.0)
ALK PHOS: 55 U/L (ref 38–126)
ALT: 9 U/L — ABNORMAL LOW (ref 14–54)
AST: 22 U/L (ref 15–41)
Anion gap: 9 (ref 5–15)
BILIRUBIN TOTAL: 0.2 mg/dL — AB (ref 0.3–1.2)
BUN: 9 mg/dL (ref 6–20)
CO2: 22 mmol/L (ref 22–32)
Calcium: 8.7 mg/dL — ABNORMAL LOW (ref 8.9–10.3)
Chloride: 109 mmol/L (ref 101–111)
Creatinine, Ser: 0.85 mg/dL (ref 0.44–1.00)
GFR calc Af Amer: >60 mL/min (ref >60)
Glucose, Bld: 166 mg/dL — ABNORMAL HIGH (ref 65–99)
Potassium: 3.7 mmol/L (ref 3.5–5.1)
Sodium: 140 mmol/L (ref 135–145)
TOTAL PROTEIN: 5.5 g/dL — AB (ref 6.5–8.1)

## 2016-07-14 LAB — TSH: TSH: 0.782 u[IU]/mL (ref 0.350–4.500)

## 2016-07-14 LAB — PROTIME-INR
INR: 1.11
Prothrombin Time: 14.4 seconds (ref 11.4–15.2)

## 2016-07-14 MED ORDER — MOMETASONE FURO-FORMOTEROL FUM 200-5 MCG/ACT IN AERO
2.0000 | INHALATION_SPRAY | Freq: Two times a day (BID) | RESPIRATORY_TRACT | Status: DC
  Administered 2016-07-14 – 2016-07-16 (×5): 2 via RESPIRATORY_TRACT
  Filled 2016-07-14 (×3): qty 8.8

## 2016-07-14 MED ORDER — ACETAMINOPHEN 325 MG PO TABS
650.0000 mg | ORAL_TABLET | Freq: Four times a day (QID) | ORAL | Status: DC | PRN
  Administered 2016-07-14: 650 mg via ORAL
  Filled 2016-07-14: qty 2

## 2016-07-14 MED ORDER — PREDNISONE 20 MG PO TABS
50.0000 mg | ORAL_TABLET | Freq: Four times a day (QID) | ORAL | Status: AC
  Administered 2016-07-14 – 2016-07-15 (×4): 50 mg via ORAL
  Filled 2016-07-14 (×4): qty 2

## 2016-07-14 MED ORDER — METOPROLOL TARTRATE 12.5 MG HALF TABLET
12.5000 mg | ORAL_TABLET | Freq: Two times a day (BID) | ORAL | Status: DC
  Administered 2016-07-14 – 2016-07-16 (×5): 12.5 mg via ORAL
  Filled 2016-07-14 (×6): qty 1

## 2016-07-14 MED ORDER — MORPHINE SULFATE (PF) 2 MG/ML IV SOLN
2.0000 mg | Freq: Four times a day (QID) | INTRAVENOUS | Status: DC | PRN
  Administered 2016-07-15: 2 mg via INTRAVENOUS
  Filled 2016-07-14: qty 1

## 2016-07-14 NOTE — Progress Notes (Addendum)
Progress Note  Patient Name: Sonia [name] Date of Encounter: 07/14/2016    Subjective   Recurrent episodes of chest pain overnight. Episode this AM.   Inpatient Medications    Scheduled Meds: . aspirin EC  81 mg Oral Daily  . atorvastatin  80 mg Oral q1800  . levothyroxine  137 mcg Oral QAC breakfast  . oseltamivir  75 mg Oral Daily  . pantoprazole  40 mg Oral QAC breakfast  . sodium chloride flush  3 mL Intravenous Q12H   Continuous Infusions: . heparin 900 Units/hr (07/13/16 2127)   PRN Meds: acetaminophen, albuterol, nitroGLYCERIN   Vital Signs    Vitals:   07/13/16 2235 07/13/16 2255 07/13/16 2330 07/14/16 0441  BP: (!) 144/79  (!) 118/98 120/65  Pulse: 75   79  Resp: 19   17  Temp:  98.7 F (37.1 C) 98.2 F (36.8 C) 98 F (36.7 C)  TempSrc:  Oral Oral Oral  SpO2: 100%  98% 98%  Weight:   154 lb 14.4 oz (70.3 kg) 154 lb 11.2 oz (70.2 kg)  Height:       No intake or output data in the 24 hours ending 07/14/16 0810 Filed Weights   07/13/16 2100 07/13/16 2330 07/14/16 0441  Weight: 160 lb 11.5 oz (72.9 kg) 154 lb 14.4 oz (70.3 kg) 154 lb 11.2 oz (70.2 kg)    Telemetry    - Personally Reviewed  ECG     - Personally Reviewed  Physical Exam   GEN: No acute distress.   Neck: No JVD Cardiac: RRR, no murmurs, rubs, or gallops.  Respiratory: Clear to auscultation bilaterally. GI: Soft, nontender, non-distended  MS: No edema; No deformity. Neuro:  Nonfocal  Psych: Normal affect   Labs    Chemistry Recent Labs Lab 07/13/16 1841 07/14/16 0455  NA 139 140  K 4.0 3.7  CL 107 109  CO2 24 22  GLUCOSE 107* 166*  BUN 14 9  CREATININE 0.86 0.85  CALCIUM 9.2 8.7*  PROT  --  5.5*  ALBUMIN  --  3.2*  AST  --  22  ALT  --  9*  ALKPHOS  --  55  BILITOT  --  0.2*  GFRNONAA >60 >60  GFRAA >60 >60  ANIONGAP 8 9     Hematology Recent Labs Lab 07/13/16 1841 07/14/16 0455  WBC 8.0 8.5  RBC 4.34 4.26  HGB 11.6* 11.3*  HCT 35.3* 34.9*  MCV  81.3 81.9  MCH 26.7 26.5  MCHC 32.9 32.4  RDW 16.2* 16.2*  PLT 154 165    Cardiac Enzymes Recent Labs Lab 07/13/16 2331 07/14/16 0455  TROPONINI 0.47* 0.37*    Recent Labs Lab 07/13/16 1901  TROPIPOC 0.17*     BNP Recent Labs Lab 07/13/16 2331  BNP 202.9*     DDimer No results for input(s): DDIMER in the last 168 hours.   Radiology    Dg Chest 2 View  Result Date: 07/13/2016 CLINICAL DATA:  76 year old female with history of pressure in the chest radiating down into both arms (left greater than right) since this morning. Shortness of breath. History of COPD. EXAM: CHEST  2 VIEW COMPARISON:  Chest x-ray a 03/07/2016. FINDINGS: Lung volumes are normal. No consolidative airspace disease. No pleural effusions. No pneumothorax. No pulmonary nodule or mass noted. Large hiatal hernia predominantly to the right of midline redemonstrated. Pulmonary vasculature and the cardiomediastinal silhouette are otherwise within normal limits. Atherosclerosis in the thoracic aorta. IMPRESSION: 1.  No radiographic evidence of acute cardiopulmonary disease. 2. Aortic atherosclerosis. 3. Large hiatal hernia. Electronically Signed   By: Vinnie Langton M.D.   On: 07/13/2016 19:31    Cardiac Studies     Patient Profile     76 y.o. female admitted with chest pain.   Assessment & Plan    1. NSTEMI - peak trop 0.47, trending down. EKG inferior and lateral precordial ST depressions - echo pending - medical therapy with ASA, atorva 80, hep gtt. Start low dose beta blocker, if tolerates then start low dose ACE-I - chest pain this morning. Will give SL NG, IV morphine. May need to restart NG drip though she has had some headache. If persistent pain may require cath over the weekend.  - dye allergy will require premedication. With her ongoing episodes of chest pain may require cath over the weekend, we will go ahead and start her prep.    Merrily Pew, MD  07/14/2016, 8:10 AM

## 2016-07-14 NOTE — Progress Notes (Signed)
ANTICOAGULATION CONSULT NOTE - Follow-up Consult  Pharmacy Consult for heparin Indication: chest pain/ACS  Assessment: 63 yof on heparin for r/o ACS. Confirmatory heparin level therapeutic (0.51) on gtt at 900 units/hr. No bleeding noted. CBC stable.    Goal of Therapy:  Heparin level 0.3-0.7 units/ml Monitor platelets by anticoagulation protocol: Yes   Plan:  Continue heparin gtt 900 units/hr Monitor daily HL and CBC   Allergies  Allergen Reactions  . Adenosine Shortness Of Breath  . Contrast Media [Iodinated Diagnostic Agents] Anaphylaxis  . Codeine Sulfate Nausea And Vomiting  . Latex Other (See Comments)    Skin irritation  . Adhesive [Tape] Other (See Comments)    SKIN IRRITATION    Patient Measurements: Height: 5' 4.57" (164 cm) Weight: 154 lb 11.2 oz (70.2 kg) IBW/kg (Calculated) : 56 Heparin Dosing Weight: 70 kg  Vital Signs: Temp: 98.2 F (36.8 C) (05/05 1326) Temp Source: Oral (05/05 1326) BP: 108/63 (05/05 1326) Pulse Rate: 79 (05/05 1326)  Labs:  Recent Labs  07/13/16 1841 07/13/16 2331 07/14/16 0455 07/14/16 1334  HGB 11.6*  --  11.3*  --   HCT 35.3*  --  34.9*  --   PLT 154  --  165  --   LABPROT  --   --  14.4  --   INR  --   --  1.11  --   HEPARINUNFRC  --   --  0.46 0.51  CREATININE 0.86  --  0.85  --   TROPONINI  --  0.47* 0.37*  --     Estimated Creatinine Clearance: 54 mL/min (by C-G formula based on SCr of 0.85 mg/dL).  Belia Heman, PharmD PGY1 Pharmacy Resident [phone] (Pager) 07/14/2016 2:35 PM

## 2016-07-14 NOTE — Progress Notes (Signed)
ANTICOAGULATION CONSULT NOTE - Follow-up Consult  Pharmacy Consult for heparin Indication: chest pain/ACS  Allergies  Allergen Reactions  . Adenosine Shortness Of Breath  . Contrast Media [Iodinated Diagnostic Agents] Anaphylaxis  . Codeine Sulfate Nausea And Vomiting  . Latex Other (See Comments)    Skin irritation  . Adhesive [Tape] Other (See Comments)    SKIN IRRITATION    Patient Measurements: Height: 5' 4.57" (164 cm) Weight: 154 lb 14.4 oz (70.3 kg) IBW/kg (Calculated) : 56 Heparin Dosing Weight: 70 kg  Vital Signs: Temp: 98 F (36.7 C) (05/05 0441) Temp Source: Oral (05/05 0441) BP: 120/65 (05/05 0441) Pulse Rate: 79 (05/05 0441)  Labs:  Recent Labs  07/13/16 1841 07/13/16 2331 07/14/16 0455  HGB 11.6*  --  11.3*  HCT 35.3*  --  34.9*  PLT 154  --  PENDING  LABPROT  --   --  14.4  INR  --   --  1.11  HEPARINUNFRC  --   --  0.46  CREATININE 0.86  --   --   TROPONINI  --  0.47*  --     Estimated Creatinine Clearance: 53.4 mL/min (by C-G formula based on SCr of 0.86 mg/dL).   Assessment: 44 yof on heparin for r/o ACS. Heparin level therapeutic (0.46) on gtt at 900 units/hr. No bleeding noted. CBC stable.    Goal of Therapy:  Heparin level 0.3-0.7 units/ml Monitor platelets by anticoagulation protocol: Yes   Plan:  Continue heparin gtt 900 units/hr Check a 6 hr confirmatory heparin level  Sherlon Handing, PharmD, BCPS Clinical pharmacist, pager [phone] 07/14/2016,6:14 AM

## 2016-07-15 ENCOUNTER — Inpatient Hospital Stay (HOSPITAL_COMMUNITY)
Admission: EM | Disposition: A | Discharge status: Home / Self Care | Payer: Self-pay | Source: Home / Self Care | Attending: Cardiology

## 2016-07-15 ENCOUNTER — Encounter (HOSPITAL_COMMUNITY)
Admission: EM | Disposition: A | Discharge status: Home / Self Care | Payer: Self-pay | Source: Home / Self Care | Attending: Cardiology

## 2016-07-15 DIAGNOSIS — Z9861 Coronary angioplasty status: Secondary | ICD-10-CM

## 2016-07-15 DIAGNOSIS — I2510 Atherosclerotic heart disease of native coronary artery without angina pectoris: Secondary | ICD-10-CM

## 2016-07-15 HISTORY — DX: Coronary angioplasty status: Z98.61

## 2016-07-15 HISTORY — PX: LEFT HEART CATH: CATH118248

## 2016-07-15 HISTORY — DX: Atherosclerotic heart disease of native coronary artery without angina pectoris: I25.10

## 2016-07-15 HISTORY — PX: CORONARY STENT INTERVENTION: CATH118234

## 2016-07-15 LAB — HEMOGLOBIN A1C
HEMOGLOBIN A1C: 5.9 % — AB (ref 4.8–5.6)
MEAN PLASMA GLUCOSE: 123 mg/dL

## 2016-07-15 LAB — CBC
HCT: 36.2 % (ref 36.0–46.0)
HEMOGLOBIN: 11.9 g/dL — AB (ref 12.0–15.0)
MCH: 26.6 pg (ref 26.0–34.0)
MCHC: 32.9 g/dL (ref 30.0–36.0)
MCV: 80.8 fL (ref 78.0–100.0)
Platelets: 162 10*3/uL (ref 150–400)
RBC: 4.48 MIL/uL (ref 3.87–5.11)
RDW: 15.9 % — ABNORMAL HIGH (ref 11.5–15.5)
WBC: 8 10*3/uL (ref 4.0–10.5)

## 2016-07-15 LAB — HEPARIN LEVEL (UNFRACTIONATED): HEPARIN UNFRACTIONATED: 0.65 [IU]/mL (ref 0.30–0.70)

## 2016-07-15 LAB — MRSA PCR SCREENING: MRSA by PCR: NEGATIVE

## 2016-07-15 SURGERY — LEFT HEART CATH
Anesthesia: LOCAL

## 2016-07-15 MED ORDER — LIDOCAINE HCL (PF) 1 % IJ SOLN
INTRAMUSCULAR | Status: DC | PRN
  Administered 2016-07-15: 2 mL via INTRADERMAL

## 2016-07-15 MED ORDER — TICAGRELOR 90 MG PO TABS
90.0000 mg | ORAL_TABLET | Freq: Two times a day (BID) | ORAL | Status: DC
  Administered 2016-07-15 – 2016-07-16 (×2): 90 mg via ORAL
  Filled 2016-07-15 (×2): qty 1

## 2016-07-15 MED ORDER — IOPAMIDOL (ISOVUE-370) INJECTION 76%
INTRAVENOUS | Status: AC
  Filled 2016-07-15: qty 100

## 2016-07-15 MED ORDER — NITROGLYCERIN IN D5W 200-5 MCG/ML-% IV SOLN
2.0000 ug/min | INTRAVENOUS | Status: DC
  Administered 2016-07-15: 10 ug/min via INTRAVENOUS

## 2016-07-15 MED ORDER — NITROGLYCERIN 1 MG/10 ML FOR IR/CATH LAB
INTRA_ARTERIAL | Status: DC | PRN
  Administered 2016-07-15 (×2): 150 ug via INTRACORONARY

## 2016-07-15 MED ORDER — MORPHINE SULFATE (PF) 4 MG/ML IV SOLN: 2.0000 mg | Freq: Four times a day (QID) | INTRAVENOUS | Status: DC | PRN

## 2016-07-15 MED ORDER — IOPAMIDOL (ISOVUE-370) INJECTION 76%
INTRAVENOUS | Status: AC
  Filled 2016-07-15: qty 50

## 2016-07-15 MED ORDER — SODIUM CHLORIDE 0.9% FLUSH: 3.0000 mL | INTRAVENOUS | Status: DC | PRN

## 2016-07-15 MED ORDER — LIDOCAINE HCL (PF) 1 % IJ SOLN
INTRAMUSCULAR | Status: AC
  Filled 2016-07-15: qty 30

## 2016-07-15 MED ORDER — HEPARIN SODIUM (PORCINE) 1000 UNIT/ML IJ SOLN
INTRAMUSCULAR | Status: DC | PRN
  Administered 2016-07-15: 4000 [IU] via INTRAVENOUS
  Administered 2016-07-15 (×2): 3000 [IU] via INTRAVENOUS

## 2016-07-15 MED ORDER — DIPHENHYDRAMINE HCL 50 MG/ML IJ SOLN
50.0000 mg | Freq: Once | INTRAMUSCULAR | Status: AC
  Administered 2016-07-15: 50 mg via INTRAVENOUS
  Filled 2016-07-15: qty 1

## 2016-07-15 MED ORDER — TICAGRELOR 90 MG PO TABS
ORAL_TABLET | ORAL | Status: DC | PRN
  Administered 2016-07-15: 180 mg via ORAL

## 2016-07-15 MED ORDER — MIDAZOLAM HCL 2 MG/2ML IJ SOLN
INTRAMUSCULAR | Status: AC
  Filled 2016-07-15: qty 2

## 2016-07-15 MED ORDER — SODIUM CHLORIDE 0.9 % WEIGHT BASED INFUSION
1.0000 mL/kg/h | INTRAVENOUS | Status: AC
  Administered 2016-07-15: 1 mL/kg/h via INTRAVENOUS

## 2016-07-15 MED ORDER — HEPARIN (PORCINE) IN NACL 2-0.9 UNIT/ML-% IJ SOLN
INTRAMUSCULAR | Status: DC | PRN
  Administered 2016-07-15: 1000 mL

## 2016-07-15 MED ORDER — TICAGRELOR 90 MG PO TABS
ORAL_TABLET | ORAL | Status: AC
  Filled 2016-07-15: qty 2

## 2016-07-15 MED ORDER — IOPAMIDOL (ISOVUE-370) INJECTION 76%
INTRAVENOUS | Status: DC | PRN
  Administered 2016-07-15: 110 mL via INTRA_ARTERIAL

## 2016-07-15 MED ORDER — ONDANSETRON HCL 4 MG/2ML IJ SOLN: 4.0000 mg | Freq: Four times a day (QID) | INTRAMUSCULAR | Status: DC | PRN

## 2016-07-15 MED ORDER — NITROGLYCERIN 1 MG/10 ML FOR IR/CATH LAB
INTRA_ARTERIAL | Status: AC
  Filled 2016-07-15: qty 10

## 2016-07-15 MED ORDER — VERAPAMIL HCL 2.5 MG/ML IV SOLN
INTRAVENOUS | Status: AC
  Filled 2016-07-15: qty 2

## 2016-07-15 MED ORDER — NITROGLYCERIN 0.4 MG SL SUBL: 0.4000 mg | SUBLINGUAL_TABLET | SUBLINGUAL | Status: DC | PRN

## 2016-07-15 MED ORDER — SODIUM CHLORIDE 0.9% FLUSH
3.0000 mL | Freq: Two times a day (BID) | INTRAVENOUS | Status: DC
  Administered 2016-07-15 – 2016-07-16 (×2): 3 mL via INTRAVENOUS

## 2016-07-15 MED ORDER — HEPARIN (PORCINE) IN NACL 2-0.9 UNIT/ML-% IJ SOLN
INTRAMUSCULAR | Status: AC
  Filled 2016-07-15: qty 1000

## 2016-07-15 MED ORDER — LABETALOL HCL 5 MG/ML IV SOLN: 10.0000 mg | INTRAVENOUS | Status: AC | PRN

## 2016-07-15 MED ORDER — MIDAZOLAM HCL 2 MG/2ML IJ SOLN
INTRAMUSCULAR | Status: DC | PRN
  Administered 2016-07-15 (×2): 1 mg via INTRAVENOUS

## 2016-07-15 MED ORDER — FAMOTIDINE IN NACL 20-0.9 MG/50ML-% IV SOLN
20.0000 mg | Freq: Once | INTRAVENOUS | Status: AC
  Administered 2016-07-15: 20 mg via INTRAVENOUS
  Filled 2016-07-15: qty 50

## 2016-07-15 MED ORDER — NITROGLYCERIN 0.4 MG SL SUBL
0.4000 mg | SUBLINGUAL_TABLET | SUBLINGUAL | Status: DC | PRN
  Administered 2016-07-15 (×2): 0.4 mg via SUBLINGUAL

## 2016-07-15 MED ORDER — HYDRALAZINE HCL 20 MG/ML IJ SOLN: 5.0000 mg | INTRAMUSCULAR | Status: AC | PRN

## 2016-07-15 MED ORDER — HEPARIN (PORCINE) IN NACL 2-0.9 UNIT/ML-% IJ SOLN
INTRAMUSCULAR | Status: DC | PRN
  Administered 2016-07-15: 10 mL via INTRA_ARTERIAL

## 2016-07-15 MED ORDER — SODIUM CHLORIDE 0.9 % IV SOLN: 250.0000 mL | INTRAVENOUS | Status: DC | PRN

## 2016-07-15 SURGICAL SUPPLY — 19 items
BALLN EUPHORA RX 2.5X12 (BALLOONS) ×2
BALLN ~~LOC~~ EUPHORA RX 3.5X15 (BALLOONS) ×2
BALLOON EUPHORA RX 2.5X12 (BALLOONS) ×1 IMPLANT
BALLOON ~~LOC~~ EUPHORA RX 3.5X15 (BALLOONS) ×1 IMPLANT
CATH 5FR JL3.5 JR4 ANG PIG MP (CATHETERS) ×2 IMPLANT
CATH LAUNCHER 5F JR4 (CATHETERS) ×2 IMPLANT
CATH LAUNCHER 6FR JR4 (CATHETERS) ×2 IMPLANT
DEVICE RAD COMP TR BAND LRG (VASCULAR PRODUCTS) ×2 IMPLANT
GLIDESHEATH SLEND SS 6F .021 (SHEATH) ×2 IMPLANT
GUIDEWIRE INQWIRE 1.5J.035X260 (WIRE) ×1 IMPLANT
INQWIRE 1.5J .035X260CM (WIRE) ×2
KIT ENCORE 26 ADVANTAGE (KITS) ×2 IMPLANT
KIT HEART LEFT (KITS) ×2 IMPLANT
PACK CARDIAC CATHETERIZATION (CUSTOM PROCEDURE TRAY) ×2 IMPLANT
STENT PROMUS PREM MR 3.0X28 (Permanent Stent) ×2 IMPLANT
SYR MEDRAD MARK V 150ML (SYRINGE) ×2 IMPLANT
TRANSDUCER W/STOPCOCK (MISCELLANEOUS) ×2 IMPLANT
TUBING CIL FLEX 10 FLL-RA (TUBING) ×2 IMPLANT
WIRE COUGAR XT STRL 190CM (WIRE) ×2 IMPLANT

## 2016-07-15 NOTE — Interval H&P Note (Signed)
Cath Lab Visit (complete for each Cath Lab visit)  Clinical Evaluation Leading to the Procedure:   ACS: Yes.    Non-ACS:    Anginal Classification: CCS IV  Anti-ischemic medical therapy: Minimal Therapy (1 class of medications)  Non-Invasive Test Results: No non-invasive testing performed  Prior CABG: No previous CABG  History and Physical Interval Note:  07/15/2016 9:17 AM  Sonia [name]  has presented today for surgery, with the diagnosis of NON STEMI  The various methods of treatment have been discussed with the patient and family. After consideration of risks, benefits and other options for treatment, the patient has consented to  Procedure(s): Left Heart Cath (N/A) as a surgical intervention .  The patient's history has been reviewed, patient examined, no change in status, stable for surgery.  I have reviewed the patient's chart and labs.  Questions were answered to the patient's satisfaction.     Sherren Mocha

## 2016-07-15 NOTE — H&P (View-Only) (Signed)
Progress Note  Patient Name: Sonia [name] Date of Encounter: 07/15/2016    Subjective   Chest pain on and off throughout the night.   Inpatient Medications    Scheduled Meds: . aspirin EC  81 mg Oral Daily  . atorvastatin  80 mg Oral q1800  . levothyroxine  137 mcg Oral QAC breakfast  . metoprolol tartrate  12.5 mg Oral BID  . mometasone-formoterol  2 puff Inhalation BID  . pantoprazole  40 mg Oral QAC breakfast  . sodium chloride flush  3 mL Intravenous Q12H   Continuous Infusions: . heparin 900 Units/hr (07/14/16 1921)  . nitroGLYCERIN 10 mcg/min (07/15/16 0703)   PRN Meds: acetaminophen, albuterol, morphine injection, nitroGLYCERIN   Vital Signs    Vitals:   07/15/16 0451 07/15/16 0600 07/15/16 0606 07/15/16 0701  BP: (!) 150/80 (!) 178/89 (!) 155/91   Pulse:      Resp:      Temp:      TempSrc:      SpO2:      Weight:    153 lb 1.6 oz (69.4 kg)  Height:        Intake/Output Summary (Last 24 hours) at 07/15/16 0748 Last data filed at 07/15/16 0701  Gross per 24 hour  Intake            507.5 ml  Output                0 ml  Net            507.5 ml   Filed Weights   07/13/16 2330 07/14/16 0441 07/15/16 0701  Weight: 154 lb 14.4 oz (70.3 kg) 154 lb 11.2 oz (70.2 kg) 153 lb 1.6 oz (69.4 kg)    Telemetry     - Personally Reviewed  ECG    - Personally Reviewed  Physical Exam   GEN: No acute distress.   Neck: No JVD Cardiac: RRR, no murmurs, rubs, or gallops.  Respiratory: Clear to auscultation bilaterally. GI: Soft, nontender, non-distended  MS: No edema; No deformity. Neuro:  Nonfocal  Psych: Normal affect   Labs    Chemistry Recent Labs Lab 07/13/16 1841 07/14/16 0455  NA 139 140  K 4.0 3.7  CL 107 109  CO2 24 22  GLUCOSE 107* 166*  BUN 14 9  CREATININE 0.86 0.85  CALCIUM 9.2 8.7*  PROT  --  5.5*  ALBUMIN  --  3.2*  AST  --  22  ALT  --  9*  ALKPHOS  --  55  BILITOT  --  0.2*  GFRNONAA >60 >60  GFRAA >60 >60  ANIONGAP  8 9     Hematology Recent Labs Lab 07/13/16 1841 07/14/16 0455 07/15/16 0253  WBC 8.0 8.5 8.0  RBC 4.34 4.26 4.48  HGB 11.6* 11.3* 11.9*  HCT 35.3* 34.9* 36.2  MCV 81.3 81.9 80.8  MCH 26.7 26.5 26.6  MCHC 32.9 32.4 32.9  RDW 16.2* 16.2* 15.9*  PLT 154 165 162    Cardiac Enzymes Recent Labs Lab 07/13/16 2331 07/14/16 0455 07/14/16 1334  TROPONINI 0.47* 0.37* 0.23*    Recent Labs Lab 07/13/16 1901  TROPIPOC 0.17*     BNP Recent Labs Lab 07/13/16 2331  BNP 202.9*     DDimer No results for input(s): DDIMER in the last 168 hours.   Radiology    Dg Chest 2 View  Result Date: 07/13/2016 CLINICAL DATA:  76 year old female with history of pressure in the chest  radiating down into both arms (left greater than right) since this morning. Shortness of breath. History of COPD. EXAM: CHEST  2 VIEW COMPARISON:  Chest x-ray a 03/07/2016. FINDINGS: Lung volumes are normal. No consolidative airspace disease. No pleural effusions. No pneumothorax. No pulmonary nodule or mass noted. Large hiatal hernia predominantly to the right of midline redemonstrated. Pulmonary vasculature and the cardiomediastinal silhouette are otherwise within normal limits. Atherosclerosis in the thoracic aorta. IMPRESSION: 1. No radiographic evidence of acute cardiopulmonary disease. 2. Aortic atherosclerosis. 3. Large hiatal hernia. Electronically Signed   By: Vinnie Langton M.D.   On: 07/13/2016 19:31    Cardiac Studies    Patient Profile      Assessment & Plan    1. NSTEMI - peak trop 0.47, trending down. EKG inferior and lateral precordial ST depressions that are dynamic - echo pending - medical therapy with ASA, atorva 80, hep gtt. Started low dose beta blocker. Will start low dose ACE-I after cath - continued chest pain on and off overnight and into this AM on NG drip. ST depressions diffuse, appear to be dynamic.  - we will plan for cath today. Spoke with Dr Burt Knack who will perform cath  today.   - dye allergy, she has completed 4 doses of prednisone. We will write for IV pepcid and benadryl. She is npo.       Merrily Pew, MD  07/15/2016, 7:48 AM

## 2016-07-15 NOTE — Progress Notes (Signed)
Patient c/o 9/10 chest pain relieved by one sublingual Nitro. Will continue to monitor patient.Sonia [name]

## 2016-07-15 NOTE — Progress Notes (Signed)
Transported pt to cathlab at this time, accompanied by Dr. Burt Knack. Denies CP.  Remains on NTG gtt.

## 2016-07-15 NOTE — Progress Notes (Signed)
Patient c/o 10/10 chest pain relieved by Morphin.  Will continue to monitor.

## 2016-07-15 NOTE — Plan of Care (Signed)
Problem: Pain Managment: Goal: General experience of comfort will improve Outcome: Not Progressing Patient continues to complain of 10/10 chest pain.  PRN pain medication given.

## 2016-07-15 NOTE — Progress Notes (Signed)
Progress Note  Patient Name: Sonia [name] Date of Encounter: 07/15/2016    Subjective   Chest pain on and off throughout the night.   Inpatient Medications    Scheduled Meds: . aspirin EC  81 mg Oral Daily  . atorvastatin  80 mg Oral q1800  . levothyroxine  137 mcg Oral QAC breakfast  . metoprolol tartrate  12.5 mg Oral BID  . mometasone-formoterol  2 puff Inhalation BID  . pantoprazole  40 mg Oral QAC breakfast  . sodium chloride flush  3 mL Intravenous Q12H   Continuous Infusions: . heparin 900 Units/hr (07/14/16 1921)  . nitroGLYCERIN 10 mcg/min (07/15/16 0703)   PRN Meds: acetaminophen, albuterol, morphine injection, nitroGLYCERIN   Vital Signs    Vitals:   07/15/16 0451 07/15/16 0600 07/15/16 0606 07/15/16 0701  BP: (!) 150/80 (!) 178/89 (!) 155/91   Pulse:      Resp:      Temp:      TempSrc:      SpO2:      Weight:    153 lb 1.6 oz (69.4 kg)  Height:        Intake/Output Summary (Last 24 hours) at 07/15/16 0748 Last data filed at 07/15/16 0701  Gross per 24 hour  Intake            507.5 ml  Output                0 ml  Net            507.5 ml   Filed Weights   07/13/16 2330 07/14/16 0441 07/15/16 0701  Weight: 154 lb 14.4 oz (70.3 kg) 154 lb 11.2 oz (70.2 kg) 153 lb 1.6 oz (69.4 kg)    Telemetry     - Personally Reviewed  ECG    - Personally Reviewed  Physical Exam   GEN: No acute distress.   Neck: No JVD Cardiac: RRR, no murmurs, rubs, or gallops.  Respiratory: Clear to auscultation bilaterally. GI: Soft, nontender, non-distended  MS: No edema; No deformity. Neuro:  Nonfocal  Psych: Normal affect   Labs    Chemistry Recent Labs Lab 07/13/16 1841 07/14/16 0455  NA 139 140  K 4.0 3.7  CL 107 109  CO2 24 22  GLUCOSE 107* 166*  BUN 14 9  CREATININE 0.86 0.85  CALCIUM 9.2 8.7*  PROT  --  5.5*  ALBUMIN  --  3.2*  AST  --  22  ALT  --  9*  ALKPHOS  --  55  BILITOT  --  0.2*  GFRNONAA >60 >60  GFRAA >60 >60  ANIONGAP  8 9     Hematology Recent Labs Lab 07/13/16 1841 07/14/16 0455 07/15/16 0253  WBC 8.0 8.5 8.0  RBC 4.34 4.26 4.48  HGB 11.6* 11.3* 11.9*  HCT 35.3* 34.9* 36.2  MCV 81.3 81.9 80.8  MCH 26.7 26.5 26.6  MCHC 32.9 32.4 32.9  RDW 16.2* 16.2* 15.9*  PLT 154 165 162    Cardiac Enzymes Recent Labs Lab 07/13/16 2331 07/14/16 0455 07/14/16 1334  TROPONINI 0.47* 0.37* 0.23*    Recent Labs Lab 07/13/16 1901  TROPIPOC 0.17*     BNP Recent Labs Lab 07/13/16 2331  BNP 202.9*     DDimer No results for input(s): DDIMER in the last 168 hours.   Radiology    Dg Chest 2 View  Result Date: 07/13/2016 CLINICAL DATA:  76 year old female with history of pressure in the chest  radiating down into both arms (left greater than right) since this morning. Shortness of breath. History of COPD. EXAM: CHEST  2 VIEW COMPARISON:  Chest x-ray a 03/07/2016. FINDINGS: Lung volumes are normal. No consolidative airspace disease. No pleural effusions. No pneumothorax. No pulmonary nodule or mass noted. Large hiatal hernia predominantly to the right of midline redemonstrated. Pulmonary vasculature and the cardiomediastinal silhouette are otherwise within normal limits. Atherosclerosis in the thoracic aorta. IMPRESSION: 1. No radiographic evidence of acute cardiopulmonary disease. 2. Aortic atherosclerosis. 3. Large hiatal hernia. Electronically Signed   By: Vinnie Langton M.D.   On: 07/13/2016 19:31    Cardiac Studies    Patient Profile      Assessment & Plan    1. NSTEMI - peak trop 0.47, trending down. EKG inferior and lateral precordial ST depressions that are dynamic - echo pending - medical therapy with ASA, atorva 80, hep gtt. Started low dose beta blocker. Will start low dose ACE-I after cath - continued chest pain on and off overnight and into this AM on NG drip. ST depressions diffuse, appear to be dynamic.  - we will plan for cath today. Spoke with Dr Burt Knack who will perform cath  today.   - dye allergy, she has completed 4 doses of prednisone. We will write for IV pepcid and benadryl. She is npo.       Merrily Pew, MD  07/15/2016, 7:48 AM

## 2016-07-15 NOTE — Progress Notes (Signed)
Patient c/o 10/10 chest pain radiating to her left arm. PRN Nitro SL given x 2; it was effective. EKG done and shows Acute MI/Stemi, MD on call Dr. Eula Fried made aware. No new orders at this time.

## 2016-07-16 ENCOUNTER — Encounter (HOSPITAL_COMMUNITY): Payer: Self-pay | Admitting: Cardiovascular Disease

## 2016-07-16 ENCOUNTER — Inpatient Hospital Stay (HOSPITAL_COMMUNITY): Payer: Medicare Other

## 2016-07-16 DIAGNOSIS — I2510 Atherosclerotic heart disease of native coronary artery without angina pectoris: Secondary | ICD-10-CM

## 2016-07-16 DIAGNOSIS — Z9861 Coronary angioplasty status: Secondary | ICD-10-CM

## 2016-07-16 DIAGNOSIS — I10 Essential (primary) hypertension: Secondary | ICD-10-CM

## 2016-07-16 DIAGNOSIS — E782 Mixed hyperlipidemia: Secondary | ICD-10-CM

## 2016-07-16 DIAGNOSIS — R072 Precordial pain: Secondary | ICD-10-CM

## 2016-07-16 DIAGNOSIS — Z955 Presence of coronary angioplasty implant and graft: Secondary | ICD-10-CM

## 2016-07-16 LAB — ECHOCARDIOGRAM COMPLETE
Height: 64.567 in
Weight: 2481.6 oz

## 2016-07-16 LAB — CBC
HEMATOCRIT: 32.1 % — AB (ref 36.0–46.0)
HEMOGLOBIN: 10.5 g/dL — AB (ref 12.0–15.0)
MCH: 26.8 pg (ref 26.0–34.0)
MCHC: 32.7 g/dL (ref 30.0–36.0)
MCV: 81.9 fL (ref 78.0–100.0)
Platelets: 150 10*3/uL (ref 150–400)
RBC: 3.92 MIL/uL (ref 3.87–5.11)
RDW: 16.6 % — ABNORMAL HIGH (ref 11.5–15.5)
WBC: 14.3 10*3/uL — ABNORMAL HIGH (ref 4.0–10.5)

## 2016-07-16 LAB — BASIC METABOLIC PANEL
ANION GAP: 7 (ref 5–15)
BUN: 13 mg/dL (ref 6–20)
CHLORIDE: 110 mmol/L (ref 101–111)
CO2: 24 mmol/L (ref 22–32)
Calcium: 8.8 mg/dL — ABNORMAL LOW (ref 8.9–10.3)
Creatinine, Ser: 0.95 mg/dL (ref 0.44–1.00)
GFR calc Af Amer: >60 mL/min (ref >60)
GFR, EST NON AFRICAN AMERICAN: 56 mL/min — AB (ref >60)
GLUCOSE: 109 mg/dL — AB (ref 65–99)
POTASSIUM: 4 mmol/L (ref 3.5–5.1)
Sodium: 141 mmol/L (ref 135–145)

## 2016-07-16 LAB — POCT ACTIVATED CLOTTING TIME
Activated Clotting Time: 208 seconds
Activated Clotting Time: 241 seconds
Activated Clotting Time: 263 seconds

## 2016-07-16 LAB — GLUCOSE, CAPILLARY: Glucose-Capillary: 85 mg/dL (ref 65–99)

## 2016-07-16 MED ORDER — PANTOPRAZOLE SODIUM 40 MG PO TBEC: 40.0000 mg | DELAYED_RELEASE_TABLET | Freq: Every day | ORAL | 4 refills | Status: DC

## 2016-07-16 MED ORDER — LISINOPRIL 2.5 MG PO TABS: 2.5000 mg | ORAL_TABLET | Freq: Every day | ORAL | 2 refills | Status: DC

## 2016-07-16 MED ORDER — ACETAMINOPHEN 325 MG PO TABS: 650.0000 mg | ORAL_TABLET | Freq: Four times a day (QID) | ORAL | Status: DC | PRN

## 2016-07-16 MED ORDER — ATORVASTATIN CALCIUM 80 MG PO TABS: 80.0000 mg | ORAL_TABLET | Freq: Every day | ORAL | 3 refills | Status: DC

## 2016-07-16 MED ORDER — ALBUTEROL SULFATE HFA 108 (90 BASE) MCG/ACT IN AERS: 2.0000 | INHALATION_SPRAY | Freq: Four times a day (QID) | RESPIRATORY_TRACT | Status: DC | PRN

## 2016-07-16 MED ORDER — TICAGRELOR 90 MG PO TABS: 90.0000 mg | ORAL_TABLET | Freq: Two times a day (BID) | ORAL | 3 refills | Status: DC

## 2016-07-16 MED ORDER — NITROGLYCERIN 0.4 MG SL SUBL: 0.4000 mg | SUBLINGUAL_TABLET | SUBLINGUAL | 2 refills | Status: DC | PRN

## 2016-07-16 MED ORDER — ASPIRIN 81 MG PO TBEC: 81.0000 mg | DELAYED_RELEASE_TABLET | Freq: Every day | ORAL | Status: DC

## 2016-07-16 MED ORDER — LISINOPRIL 2.5 MG PO TABS
2.5000 mg | ORAL_TABLET | Freq: Every day | ORAL | Status: DC
  Administered 2016-07-16: 2.5 mg via ORAL
  Filled 2016-07-16: qty 1

## 2016-07-16 MED ORDER — METOPROLOL TARTRATE 25 MG PO TABS: 12.5000 mg | ORAL_TABLET | Freq: Two times a day (BID) | ORAL | 3 refills | Status: DC

## 2016-07-16 MED FILL — Verapamil HCl IV Soln 2.5 MG/ML: INTRAVENOUS | Qty: 2 | Status: AC

## 2016-07-16 NOTE — Discharge Instructions (Signed)
Coronary Angiogram With Stent, Care After °This sheet gives you information about how to care for yourself after your procedure. Your health care provider may also give you more specific instructions. If you have problems or questions, contact your health care provider. °What can I expect after the procedure? °After your procedure, it is common to have: °· Bruising in the area where a small, thin tube (catheter) was inserted. This usually fades within 1-2 weeks. °· Blood collecting in the tissue (hematoma) that may be painful to the touch. It should usually decrease in size and tenderness within 1-2 weeks. °Follow these instructions at home: °Insertion area care  °· Do not take baths, swim, or use a hot tub until your health care provider approves. °· You may shower 24-48 hours after the procedure or as directed by your health care provider. °· Follow instructions from your health care provider about how to take care of your incision. Make sure you: °¨ Wash your hands with soap and water before you change your bandage (dressing). If soap and water are not available, use hand sanitizer. °¨ Change your dressing as told by your health care provider. °¨ Leave stitches (sutures), skin glue, or adhesive strips in place. These skin closures may need to stay in place for 2 weeks or longer. If adhesive strip edges start to loosen and curl up, you may trim the loose edges. Do not remove adhesive strips completely unless your health care provider tells you to do that. °· Remove the bandage (dressing) and gently wash the catheter insertion site with plain soap and water. °· Pat the area dry with a clean towel. Do not rub the area, because that may cause bleeding. °· Do not apply powder or lotion to the incision area. °· Check your incision area every day for signs of infection. Check for: °¨ More redness, swelling, or pain. °¨ More fluid or blood. °¨ Warmth. °¨ Pus or a bad smell. °Activity  °· Do not drive for 24 hours if you  were given a medicine to help you relax (sedative). °· Do not lift anything that is heavier than 10 lb (4.5 kg) for 5 days after your procedure or as directed by your health care provider. °· Ask your health care provider when it is okay for you: °¨ To return to work or school. °¨ To resume usual physical activities or sports. °¨ To resume sexual activity. °Eating and drinking  °· Eat a heart-healthy diet. This should include plenty of fresh fruits and vegetables. °· Avoid the following types of food: °¨ Food that is high in salt. °¨ Canned or highly processed food. °¨ Food that is high in saturated fat or sugar. °¨ Fried food. °· Limit alcohol intake to no more than 1 drink a day for non-pregnant women and 2 drinks a day for men. One drink equals 12 oz of beer, 5 oz of wine, or 1½ oz of hard liquor. °Lifestyle  °· Do not use any products that contain nicotine or tobacco, such as cigarettes and e-cigarettes. If you need help quitting, ask your health care provider. °· Take steps to manage and control your weight. °· Get regular exercise. °· Manage your blood pressure. °· Manage other health problems, such as diabetes. °General instructions  °· Take over-the-counter and prescription medicines only as told by your health care provider. Blood thinners may be prescribed after your procedure to improve blood flow through the stent. °· If you need an MRI after your heart stent   has been placed, be sure to tell the health care provider who orders the MRI that you have a heart stent. °· Keep all follow-up visits as directed by your health care provider. This is important. °Contact a health care provider if: °· You have a fever. °· You have chills. °· You have increased bleeding from the catheter insertion area. Hold pressure on the area. °Get help right away if: °· You develop chest pain or shortness of breath. °· You feel faint or you pass out. °· You have unusual pain at the catheter insertion area. °· You have redness,  warmth, or swelling at the catheter insertion area. °· You have drainage (other than a small amount of blood on the dressing) from the catheter insertion area. °· The catheter insertion area is bleeding, and the bleeding does not stop after 30 minutes of holding steady pressure on the area. °· You develop bleeding from any other place, such as from your rectum. There may be bright red blood in your urine or stool, or it may appear as black, tarry stool. °This information is not intended to replace advice given to you by your health care provider. Make sure you discuss any questions you have with your health care provider. °Document Released: 09/15/2004 Document Revised: 11/24/2015 Document Reviewed: 11/24/2015 °Elsevier Interactive Patient Education © 2017 [employer] ° °

## 2016-07-16 NOTE — Progress Notes (Addendum)
Patient Name: Sonia [name] Date of Encounter: 07/16/2016  Active Problems:   NSTEMI (non-ST elevated myocardial infarction) ([place])   Length of Stay: 3  SUBJECTIVE  No CP or SOB.  CURRENT MEDS . aspirin EC  81 mg Oral Daily  . atorvastatin  80 mg Oral q1800  . levothyroxine  137 mcg Oral QAC breakfast  . metoprolol tartrate  12.5 mg Oral BID  . mometasone-formoterol  2 puff Inhalation BID  . pantoprazole  40 mg Oral QAC breakfast  . sodium chloride flush  3 mL Intravenous Q12H  . sodium chloride flush  3 mL Intravenous Q12H  . ticagrelor  90 mg Oral BID    OBJECTIVE  Vitals:   07/16/16 0727 07/16/16 0800 07/16/16 0818 07/16/16 0920  BP: 96/62 113/62 136/75   Pulse: 76 75 82   Resp: 13 16 17    Temp: 97.9 F (36.6 C)     TempSrc: Oral     SpO2: 97% 95% 99% 99%  Weight:      Height:        Intake/Output Summary (Last 24 hours) at 07/16/16 0944 Last data filed at 07/15/16 2000  Gross per 24 hour  Intake            555.2 ml  Output                0 ml  Net            555.2 ml   Filed Weights   07/14/16 0441 07/15/16 0701 07/16/16 0357  Weight: 154 lb 11.2 oz (70.2 kg) 153 lb 1.6 oz (69.4 kg) 155 lb 1.6 oz (70.4 kg)    PHYSICAL EXAM  General: Pleasant, NAD. Neuro: Alert and oriented X 3. Moves all extremities spontaneously. Psych: Normal affect. HEENT:  Normal  Neck: Supple without bruits or JVD. Lungs:  Resp regular and unlabored, CTA. Heart: RRR no s3, s4, or murmurs. Abdomen: Soft, non-tender, non-distended, BS + x 4.  Extremities: No clubbing, cyanosis or edema. DP/PT/Radials 2+ and equal bilaterally.  Accessory Clinical Findings  CBC  Recent Labs  07/15/16 0253 07/16/16 0156  WBC 8.0 14.3*  HGB 11.9* 10.5*  HCT 36.2 32.1*  MCV 80.8 81.9  PLT 162 497   Basic Metabolic Panel  Recent Labs  07/14/16 0455 07/16/16 0156  NA 140 141  K 3.7 4.0  CL 109 110  CO2 22 24  GLUCOSE 166* 109*  BUN 9 13  CREATININE 0.85 0.95  CALCIUM 8.7*  8.8*   Liver Function Tests  Recent Labs  07/14/16 0455  AST 22  ALT 9*  ALKPHOS 55  BILITOT 0.2*  PROT 5.5*  ALBUMIN 3.2*    Recent Labs  07/13/16 2331 07/14/16 0455 07/14/16 1334  TROPONINI 0.47* 0.37* 0.23*    Recent Labs  07/13/16 2331  HGBA1C 5.9*   Fasting Lipid Panel  Recent Labs  07/13/16 2331  CHOL 227*  HDL 76  LDLCALC 139*  TRIG 60  CHOLHDL 3.0   Thyroid Function Tests  Recent Labs  07/13/16 2331  TSH 0.782   Radiology/Studies  Dg Chest 2 View  Result Date: 07/13/2016 CLINICAL DATA:  76 year old female with history of pressure in the chest radiating down into both arms (left greater than right) since this morning. Shortness of breath. History of COPD. EXAM: CHEST  2 VIEW COMPARISON:  Chest x-ray a 03/07/2016. FINDINGS: Lung volumes are normal. No consolidative airspace disease. No pleural effusions. No pneumothorax. No pulmonary nodule or  mass noted. Large hiatal hernia predominantly to the right of midline redemonstrated. Pulmonary vasculature and the cardiomediastinal silhouette are otherwise within normal limits. Atherosclerosis in the thoracic aorta. IMPRESSION: 1. No radiographic evidence of acute cardiopulmonary disease. 2. Aortic atherosclerosis. 3. Large hiatal hernia. Electronically Signed   By: Vinnie Langton M.D.   On: 07/13/2016 19:31   TELE: SR, personally reviewed  ECG: SR, PVC, otherwise normal, personally reviewed    ASSESSMENT AND PLAN  1. NSTEMI - peak trop 0.47, trending down. EKG inferior and lateral precordial ST depressions that are dynamic - echo pending - medical therapy with ASA, atorva 80, hep gtt. Started low dose beta blocker. Will start low dose ACE-I  Lisinopril 2.5 mg po daily Cath showed severe single-vessel coronary artery disease with severe stenosis of the RCA treated 6 escalate with PCI using a drug-eluting stent (3.0 x 28 mm Promus DES postdilated to 3.5 mm), nonobstructive LAD stenosis, normal LV  systolic function with mild mitral regurgitation - DAPT with ASA and brilinta without interruption x 12 months - discharge today - follow up in 7-10 days   2. HTN - on metoprolol, add lisinopril  3. HLP - on high dose atorvastatin  Signed, Ena Dawley MD, [facility] 07/16/2016

## 2016-07-16 NOTE — Progress Notes (Signed)
  Echocardiogram 2D Echocardiogram has been performed.  Tresa Res 07/16/2016, 1:19 PM

## 2016-07-16 NOTE — Progress Notes (Signed)
CARDIAC REHAB PHASE I   PRE:  Rate/Rhythm: 75 SR    BP: sitting 113/62    SaO2: 97 RA  MODE:  Ambulation: 300 ft   POST:  Rate/Rhythm: 106 ST    BP: sitting 136/75     SaO2: 99 RA  Pt tolerated walking well. Becoming slightly SOB toward end with HR elevated.  To recliner. Ed completed with pt, pt very receptive and grateful. Will refer to [place]. She understands importance of Brilinta but still need booklet.  [place], ACSM 07/16/2016 9:09 AM

## 2016-07-16 NOTE — Progress Notes (Signed)
Pt education complete at this time.  No further comments questions or concerns.  Pt educated on when to take brilinta and importance of taking it as prescribed.  Emotional support given.  Pt taken out to family members car.

## 2016-07-16 NOTE — Discharge Summary (Signed)
Discharge Summary    Patient ID: Sonia [name],  MRN: [id number], DOB/AGE: 09-01-1940 76 y.o.  Admit date: 07/13/2016 Discharge date: 07/16/2016  Primary Care Provider: [name] Primary Cardiologist: TBD- pt request f/u at [facility]  Discharge Diagnoses    Principal Problem:   NSTEMI (non-ST elevated myocardial infarction) [facility]) Active Problems:   Dyslipidemia   COPD with asthma ([place])   Hypothyroidism   CAD S/P percutaneous coronary angioplasty   Allergies Allergies  Allergen Reactions  . Adenosine Shortness Of Breath  . Contrast Media [Iodinated Diagnostic Agents] Anaphylaxis  . Codeine Sulfate Nausea And Vomiting  . Latex Other (See Comments)    Skin irritation  . Adhesive [Tape] Other (See Comments)    SKIN IRRITATION    Diagnostic Studies/Procedures    Cath/PCI 07/15/16 _____________   History of Present Illness     76 y/o female admitted with NSTEMI 07/13/16  Hospital Course     76 y.o.white female with no prior history of CAD but history of asthma/COPD, hypothyroidism, and  hiatal hernia presented 07/13/16 with epigastric pain and misternal chest pain. Her symptoms were vague- some epigastric discomfort associated with nausea, fatigue, headache, neck pain and numbness radiating to the left arm. Symptoms were intermittent and would last for 3- 10 minutes. She never had similar sx before. Her EKG showed inferior and lateral precordial ST depressions She was admitted and started on high dose statin, ASA, low dose beta blocker, Heparin and NTG. She was taken to the cath lab 07/15/16 (Sunday) for recurrent chest pain despite medical Rx. She did have a contrast allergy ans she was pre medicated for this. Cath by Dr Burt Knack revealed a 90% mRCA lesion and a 50% pLAD lesion. The RCA was treated with DES with good final result. Her EF was 55-60%. Dr Meda Coffee saw her on the morning of 07/16/16 and feels she can be discharged with plans for [facility] follow up at the [facility] (pt's request).   _____________  Discharge Vitals Blood pressure 136/75, pulse 82, temperature 97.9 F (36.6 C), temperature source Oral, resp. rate 17, height 5' 4.57" (1.64 m), weight 155 lb 1.6 oz (70.4 kg), SpO2 99 %.  Filed Weights   07/14/16 0441 07/15/16 0701 07/16/16 0357  Weight: 154 lb 11.2 oz (70.2 kg) 153 lb 1.6 oz (69.4 kg) 155 lb 1.6 oz (70.4 kg)    Labs & Radiologic Studies    CBC  Recent Labs  07/15/16 0253 07/16/16 0156  WBC 8.0 14.3*  HGB 11.9* 10.5*  HCT 36.2 32.1*  MCV 80.8 81.9  PLT 162 829   Basic Metabolic Panel  Recent Labs  07/14/16 0455 07/16/16 0156  NA 140 141  K 3.7 4.0  CL 109 110  CO2 22 24  GLUCOSE 166* 109*  BUN 9 13  CREATININE 0.85 0.95  CALCIUM 8.7* 8.8*   Liver Function Tests  Recent Labs  07/14/16 0455  AST 22  ALT 9*  ALKPHOS 55  BILITOT 0.2*  PROT 5.5*  ALBUMIN 3.2*   No results for input(s): LIPASE, AMYLASE in the last 72 hours. Cardiac Enzymes  Recent Labs  07/13/16 2331 07/14/16 0455 07/14/16 1334  TROPONINI 0.47* 0.37* 0.23*   BNP Invalid input(s): POCBNP D-Dimer No results for input(s): DDIMER in the last 72 hours. Hemoglobin A1C  Recent Labs  07/13/16 2331  HGBA1C 5.9*   Fasting Lipid Panel  Recent Labs  07/13/16 2331  CHOL 227*  HDL 76  LDLCALC 139*  TRIG  60  CHOLHDL 3.0   Thyroid Function Tests  Recent Labs  07/13/16 2331  TSH 0.782   _____________  Dg Chest 2 View  Result Date: 07/13/2016 CLINICAL DATA:  76 year old female with history of pressure in the chest radiating down into both arms (left greater than right) since this morning. Shortness of breath. History of COPD. EXAM: CHEST  2 VIEW COMPARISON:  Chest x-ray a 03/07/2016. FINDINGS: Lung volumes are normal. No consolidative airspace disease. No pleural effusions. No pneumothorax. No pulmonary nodule or mass noted. Large hiatal hernia predominantly to the right of midline redemonstrated. Pulmonary vasculature and  the cardiomediastinal silhouette are otherwise within normal limits. Atherosclerosis in the thoracic aorta. IMPRESSION: 1. No radiographic evidence of acute cardiopulmonary disease. 2. Aortic atherosclerosis. 3. Large hiatal hernia. Electronically Signed   By: Vinnie Langton M.D.   On: 07/13/2016 19:31   Disposition   Pt is being discharged home today in good condition.  Follow-up Plans & Appointments    Follow-up Information    Erlene Quan, PA-C Follow up.   Specialties:  Cardiology, Radiology Why:  office will contact you Contact information: [address] [place] [address] [phone]          Discharge Instructions    Amb Referral to Cardiac Rehabilitation    Complete by:  As directed    Diagnosis:   Coronary Stents PTCA NSTEMI        Discharge Medications   Current Discharge Medication List    START taking these medications   Details  acetaminophen (TYLENOL) 325 MG tablet Take 2 tablets (650 mg total) by mouth every 6 (six) hours as needed for mild pain, fever or headache.    aspirin EC 81 MG EC tablet Take 1 tablet (81 mg total) by mouth daily.    atorvastatin (LIPITOR) 80 MG tablet Take 1 tablet (80 mg total) by mouth daily at 6 PM. Qty: 90 tablet, Refills: 3    lisinopril (PRINIVIL,ZESTRIL) 2.5 MG tablet Take 1 tablet (2.5 mg total) by mouth daily. Qty: 90 tablet, Refills: 2    metoprolol tartrate (LOPRESSOR) 25 MG tablet Take 0.5 tablets (12.5 mg total) by mouth 2 (two) times daily. Qty: 90 tablet, Refills: 3    nitroGLYCERIN (NITROSTAT) 0.4 MG SL tablet Place 1 tablet (0.4 mg total) under the tongue every 5 (five) minutes as needed for chest pain (CP or SOB). Qty: 25 tablet, Refills: 2    ticagrelor (BRILINTA) 90 MG TABS tablet Take 1 tablet (90 mg total) by mouth 2 (two) times daily. Qty: 180 tablet, Refills: 3      CONTINUE these medications which have CHANGED   Details  albuterol (PROAIR HFA) 108 (90 Base) MCG/ACT inhaler  Inhale 2 puffs into the lungs every 6 (six) hours as needed for wheezing or shortness of breath.    pantoprazole (PROTONIX) 40 MG tablet Take 1 tablet (40 mg total) by mouth daily before breakfast. Qty: 30 tablet, Refills: 4      CONTINUE these medications which have NOT CHANGED   Details  albuterol (PROVENTIL) (2.5 MG/3ML) 0.083% nebulizer solution Take 3 mLs (2.5 mg total) by nebulization every 6 (six) hours as needed for wheezing or shortness of breath. Qty: 75 mL, Refills: 12   Associated Diagnoses: Severe asthma with acute exacerbation, unspecified whether persistent    budesonide-formoterol (SYMBICORT) 160-4.5 MCG/ACT inhaler Inhale 2 puffs into the lungs 2 (two) times daily. Qty: 3 Inhaler, Refills: 3    calcium carbonate (TUMS EX)  750 MG chewable tablet Chew 1 tablet by mouth at bedtime as needed for heartburn (acid indigestion).    levothyroxine (SYNTHROID, LEVOTHROID) 137 MCG tablet Take 137 mcg by mouth daily before breakfast.      STOP taking these medications     ibuprofen (ADVIL,MOTRIN) 200 MG tablet      famotidine (PEPCID) 20 MG tablet          Aspirin prescribed at discharge?  Yes High Intensity Statin Prescribed? (Lipitor 40-80mg  or Crestor 20-40mg ): Yes Beta Blocker Prescribed? Yes For EF <40%, was ACEI/ARB Prescribed? Yes ADP Receptor Inhibitor Prescribed? (i.e. Plavix etc.-Includes Medically Managed Patients): Yes For EF <40%, Aldosterone Inhibitor Prescribed? No: NA Was EF assessed during THIS hospitalization? Yes Was Cardiac Rehab II ordered? (Included Medically managed Patients): Yes   Outstanding Labs/Studies   Echo is pending- if done before discharge will f/u at [facility]. If not done will do as an OP as she had some MR at cath.   Duration of Discharge Encounter   Greater than 30 minutes including physician time.  Angelena Form PA 07/16/2016, 10:57 AM

## 2016-07-18 ENCOUNTER — Encounter (HOSPITAL_COMMUNITY): Payer: Self-pay

## 2016-07-18 ENCOUNTER — Telehealth (HOSPITAL_COMMUNITY): Payer: Self-pay | Admitting: Internal Medicine

## 2016-07-18 NOTE — Telephone Encounter (Signed)
Verified Medicare A/B & AARP insurance benefits through [employer]. Reference [phone] & [id number]... KJ

## 2016-07-19 ENCOUNTER — Telehealth: Payer: Self-pay | Admitting: Cardiology

## 2016-07-19 NOTE — Telephone Encounter (Signed)
Spoke with pt informed that bruising is common after L heart cath. She states that is very little pain and that she will keep an eye on this. Pt states that no one told her this was going to happen or she wouldn't have called. Informed pt to call back of symptoms worsen or bruise gets bigger. Pt verbalizes acknowledgement and will call back is any new symptoms

## 2016-07-19 NOTE — Telephone Encounter (Signed)
Please call,pt has an area on her left arm that seems to be bleeding underneath the skin. Pt had stent put in on 5--6-18.

## 2016-07-24 ENCOUNTER — Telehealth: Payer: Self-pay | Admitting: Cardiology

## 2016-07-24 ENCOUNTER — Inpatient Hospital Stay (HOSPITAL_COMMUNITY)
Admission: EM | Admit: 2016-07-24 | Discharge: 2016-07-26 | DRG: 377 | Disposition: A | Discharge status: Home / Self Care | Payer: Medicare Other | Attending: Internal Medicine | Admitting: Internal Medicine

## 2016-07-24 ENCOUNTER — Encounter (HOSPITAL_COMMUNITY): Payer: Self-pay

## 2016-07-24 ENCOUNTER — Emergency Department (HOSPITAL_COMMUNITY): Payer: Medicare Other

## 2016-07-24 DIAGNOSIS — K264 Chronic or unspecified duodenal ulcer with hemorrhage: Principal | ICD-10-CM | POA: Diagnosis present

## 2016-07-24 DIAGNOSIS — I2510 Atherosclerotic heart disease of native coronary artery without angina pectoris: Secondary | ICD-10-CM | POA: Diagnosis not present

## 2016-07-24 DIAGNOSIS — Z79899 Other long term (current) drug therapy: Secondary | ICD-10-CM | POA: Diagnosis not present

## 2016-07-24 DIAGNOSIS — R0602 Shortness of breath: Secondary | ICD-10-CM | POA: Diagnosis not present

## 2016-07-24 DIAGNOSIS — D62 Acute posthemorrhagic anemia: Secondary | ICD-10-CM | POA: Diagnosis present

## 2016-07-24 DIAGNOSIS — R079 Chest pain, unspecified: Secondary | ICD-10-CM | POA: Diagnosis not present

## 2016-07-24 DIAGNOSIS — N289 Disorder of kidney and ureter, unspecified: Secondary | ICD-10-CM | POA: Diagnosis not present

## 2016-07-24 DIAGNOSIS — Z7951 Long term (current) use of inhaled steroids: Secondary | ICD-10-CM

## 2016-07-24 DIAGNOSIS — I214 Non-ST elevation (NSTEMI) myocardial infarction: Secondary | ICD-10-CM | POA: Diagnosis present

## 2016-07-24 DIAGNOSIS — Z91040 Latex allergy status: Secondary | ICD-10-CM

## 2016-07-24 DIAGNOSIS — Z888 Allergy status to other drugs, medicaments and biological substances status: Secondary | ICD-10-CM | POA: Diagnosis not present

## 2016-07-24 DIAGNOSIS — K219 Gastro-esophageal reflux disease without esophagitis: Secondary | ICD-10-CM | POA: Diagnosis present

## 2016-07-24 DIAGNOSIS — R0789 Other chest pain: Secondary | ICD-10-CM | POA: Diagnosis not present

## 2016-07-24 DIAGNOSIS — K921 Melena: Secondary | ICD-10-CM | POA: Diagnosis not present

## 2016-07-24 DIAGNOSIS — Z9861 Coronary angioplasty status: Secondary | ICD-10-CM

## 2016-07-24 DIAGNOSIS — N179 Acute kidney failure, unspecified: Secondary | ICD-10-CM | POA: Diagnosis not present

## 2016-07-24 DIAGNOSIS — Z955 Presence of coronary angioplasty implant and graft: Secondary | ICD-10-CM

## 2016-07-24 DIAGNOSIS — K922 Gastrointestinal hemorrhage, unspecified: Secondary | ICD-10-CM

## 2016-07-24 DIAGNOSIS — Z7982 Long term (current) use of aspirin: Secondary | ICD-10-CM | POA: Diagnosis not present

## 2016-07-24 DIAGNOSIS — J449 Chronic obstructive pulmonary disease, unspecified: Secondary | ICD-10-CM | POA: Diagnosis present

## 2016-07-24 DIAGNOSIS — E063 Autoimmune thyroiditis: Secondary | ICD-10-CM | POA: Diagnosis present

## 2016-07-24 DIAGNOSIS — K269 Duodenal ulcer, unspecified as acute or chronic, without hemorrhage or perforation: Secondary | ICD-10-CM | POA: Diagnosis not present

## 2016-07-24 DIAGNOSIS — K449 Diaphragmatic hernia without obstruction or gangrene: Secondary | ICD-10-CM | POA: Diagnosis present

## 2016-07-24 DIAGNOSIS — Z91041 Radiographic dye allergy status: Secondary | ICD-10-CM

## 2016-07-24 DIAGNOSIS — J45901 Unspecified asthma with (acute) exacerbation: Secondary | ICD-10-CM

## 2016-07-24 LAB — I-STAT TROPONIN, ED: TROPONIN I, POC: 0.01 ng/mL (ref 0.00–0.08)

## 2016-07-24 LAB — CBC
HEMATOCRIT: 36.6 % (ref 36.0–46.0)
HEMOGLOBIN: 11.6 g/dL — AB (ref 12.0–15.0)
MCH: 26.3 pg (ref 26.0–34.0)
MCHC: 31.7 g/dL (ref 30.0–36.0)
MCV: 83 fL (ref 78.0–100.0)
Platelets: 190 10*3/uL (ref 150–400)
RBC: 4.41 MIL/uL (ref 3.87–5.11)
RDW: 16 % — ABNORMAL HIGH (ref 11.5–15.5)
WBC: 9.5 10*3/uL (ref 4.0–10.5)

## 2016-07-24 LAB — COMPREHENSIVE METABOLIC PANEL
ALBUMIN: 3.5 g/dL (ref 3.5–5.0)
ALT: 10 U/L — ABNORMAL LOW (ref 14–54)
ANION GAP: 9 (ref 5–15)
AST: 22 U/L (ref 15–41)
Alkaline Phosphatase: 76 U/L (ref 38–126)
BILIRUBIN TOTAL: 0.2 mg/dL — AB (ref 0.3–1.2)
BUN: 29 mg/dL — ABNORMAL HIGH (ref 6–20)
CHLORIDE: 109 mmol/L (ref 101–111)
CO2: 22 mmol/L (ref 22–32)
Calcium: 9.1 mg/dL (ref 8.9–10.3)
Creatinine, Ser: 1.34 mg/dL — ABNORMAL HIGH (ref 0.44–1.00)
GFR calc Af Amer: 43 mL/min — ABNORMAL LOW (ref >60)
GFR calc non Af Amer: 37 mL/min — ABNORMAL LOW (ref >60)
GLUCOSE: 114 mg/dL — AB (ref 65–99)
POTASSIUM: 4.2 mmol/L (ref 3.5–5.1)
Sodium: 140 mmol/L (ref 135–145)
TOTAL PROTEIN: 6.4 g/dL — AB (ref 6.5–8.1)

## 2016-07-24 LAB — TYPE AND SCREEN
ABO/RH(D): O POS
ANTIBODY SCREEN: NEGATIVE

## 2016-07-24 LAB — ABO/RH: ABO/RH(D): O POS

## 2016-07-24 LAB — POC OCCULT BLOOD, ED: Fecal Occult Bld: POSITIVE — AB

## 2016-07-24 MED ORDER — PANTOPRAZOLE SODIUM 40 MG PO TBEC
40.0000 mg | DELAYED_RELEASE_TABLET | Freq: Every day | ORAL | Status: DC
  Administered 2016-07-25 – 2016-07-26 (×2): 40 mg via ORAL
  Filled 2016-07-24 (×2): qty 1

## 2016-07-24 MED ORDER — SODIUM CHLORIDE 0.9 % IV SOLN
INTRAVENOUS | Status: DC
  Administered 2016-07-25 (×2): via INTRAVENOUS

## 2016-07-24 MED ORDER — LEVOTHYROXINE SODIUM 137 MCG PO TABS
137.0000 ug | ORAL_TABLET | Freq: Every day | ORAL | Status: DC
  Administered 2016-07-25 – 2016-07-26 (×2): 137 ug via ORAL
  Filled 2016-07-24 (×2): qty 1

## 2016-07-24 MED ORDER — TICAGRELOR 90 MG PO TABS
90.0000 mg | ORAL_TABLET | Freq: Two times a day (BID) | ORAL | Status: DC
  Administered 2016-07-25 (×2): 90 mg via ORAL
  Filled 2016-07-24 (×2): qty 1

## 2016-07-24 MED ORDER — ACETAMINOPHEN 325 MG PO TABS: 650.0000 mg | ORAL_TABLET | Freq: Four times a day (QID) | ORAL | Status: DC | PRN

## 2016-07-24 MED ORDER — ASPIRIN EC 81 MG PO TBEC
81.0000 mg | DELAYED_RELEASE_TABLET | Freq: Every day | ORAL | Status: DC
  Administered 2016-07-25 – 2016-07-26 (×2): 81 mg via ORAL
  Filled 2016-07-24 (×2): qty 1

## 2016-07-24 MED ORDER — ALBUTEROL SULFATE (2.5 MG/3ML) 0.083% IN NEBU
2.5000 mg | INHALATION_SOLUTION | Freq: Four times a day (QID) | RESPIRATORY_TRACT | Status: DC | PRN
Start: 2016-07-24 — End: 2016-07-26

## 2016-07-24 MED ORDER — ALBUTEROL SULFATE HFA 108 (90 BASE) MCG/ACT IN AERS: 2.0000 | INHALATION_SPRAY | Freq: Four times a day (QID) | RESPIRATORY_TRACT | Status: DC | PRN

## 2016-07-24 MED ORDER — ATORVASTATIN CALCIUM 80 MG PO TABS
80.0000 mg | ORAL_TABLET | Freq: Every day | ORAL | Status: DC
  Administered 2016-07-25: 80 mg via ORAL
  Filled 2016-07-24: qty 1

## 2016-07-24 MED ORDER — METOPROLOL TARTRATE 12.5 MG HALF TABLET
12.5000 mg | ORAL_TABLET | Freq: Two times a day (BID) | ORAL | Status: DC
  Administered 2016-07-25 – 2016-07-26 (×3): 12.5 mg via ORAL
  Filled 2016-07-24 (×4): qty 1

## 2016-07-24 MED ORDER — FLUTICASONE FUROATE-VILANTEROL 200-25 MCG/INH IN AEPB
1.0000 | INHALATION_SPRAY | Freq: Every day | RESPIRATORY_TRACT | Status: DC
  Filled 2016-07-24: qty 28

## 2016-07-24 NOTE — Telephone Encounter (Signed)
New Message     Needs a kit to test feces for blood , can she pick it up at office or does she need to go to lab ?

## 2016-07-24 NOTE — ED Provider Notes (Signed)
[facility] DEPT Provider Note   CSN: [id number] Arrival date & time: 07/24/16  1817     History   Chief Complaint Chief Complaint  Patient presents with  . GI Bleeding    HPI Tonnie Stillman is a 76 y.o. female.  The history is provided by the patient.  Abdominal Pain   This is a new problem. The current episode started yesterday. The problem occurs constantly. The problem has been resolved. The pain is associated with an unknown factor. The pain is located in the LLQ. The quality of the pain is dull. The pain is at a severity of 4/10. The pain is moderate. Associated symptoms include melena. Pertinent negatives include fever, hematochezia, nausea, vomiting, constipation and dysuria. Nothing aggravates the symptoms. Nothing relieves the symptoms.   Pt recently had NSTEMI last week requiring stenting of RCA. She was placed on Brilinta and ASA.  Reports prior melena 5 yrs with GI EGD and Colonoscopy that did not find the source.   Past Medical History:  Diagnosis Date  . Arthritis   . Asthma    daily and prn inhalers; states good control currently  . COPD (chronic obstructive pulmonary disease) ([place])   . GERD (gastroesophageal reflux disease)    no current med.  . Hashimoto's thyroiditis   . Headache    allergies; silent migraines  . Hiatal hernia   . Hypothyroidism   . Pneumonia   . 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 Vitamin D deficiency     Patient Active Problem List   Diagnosis Date Noted  . CAD S/P percutaneous coronary angioplasty 07/16/2016  . NSTEMI (non-ST elevated myocardial infarction) ([place]) 07/13/2016  . Severe asthma with acute exacerbation 03/07/2016  . Genetic testing 06/09/2015  . Family history of breast cancer in female 05/10/2015  . History of melanoma 05/10/2015  . Atypical lobular hyperplasia of left breast 04/26/2015  . Vision problem 01/08/2014  . Hiatus hernia syndrome 08/01/2012  . Palpitations 10/18/2010  . Acute chest  pain 10/18/2010  . Hypothyroidism 10/18/2010  . Dyslipidemia 06/07/2009  . Upper airway cough syndrome 03/29/2009  . COPD with asthma ([place]) 12/05/2006    Past Surgical History:  Procedure Laterality Date  . ABDOMINAL HYSTERECTOMY     partial  . APPENDECTOMY    . BREAST CYST EXCISION Left   . BREAST LUMPECTOMY WITH RADIOACTIVE SEED LOCALIZATION Left 04/07/2015   Procedure: BREAST LUMPECTOMY WITH RADIOACTIVE SEED LOCALIZATION;  Surgeon: Erroll Luna, MD;  Location: [facility];  Service: General;  Laterality: Left;  . CORONARY STENT INTERVENTION N/A 07/15/2016   Procedure: Coronary Stent Intervention;  Surgeon: Sherren Mocha, MD;  Location: [facility];  Service: Cardiovascular;  Laterality: N/A;  . KNEE ARTHROSCOPY Right 12/07/2003  . LAPAROSCOPIC SALPINGO OOPHERECTOMY Right 09/08/2003  . LEFT HEART CATH N/A 07/15/2016   Procedure: Left Heart Cath;  Surgeon: Sherren Mocha, MD;  Location: [facility];  Service: Cardiovascular;  Laterality: N/A;  . LYSIS OF ADHESION  09/08/2003  . NASAL SINUS SURGERY  age 34  . RECTAL POLYPECTOMY  10/02/2006    OB History    No data available       Home Medications    Prior to Admission medications   Medication Sig Start Date End Date Taking? Authorizing Provider  acetaminophen (TYLENOL) 325 MG tablet Take 2 tablets (650 mg total) by mouth every 6 (six) hours as needed for mild pain, fever or headache. 07/16/16   Erlene Quan, PA-C  albuterol (PROAIR HFA) 108 (90 Base) MCG/ACT inhaler Inhale 2 puffs into the lungs every 6 (six) hours as needed for wheezing or shortness of breath. 07/16/16   Erlene Quan, PA-C  albuterol (PROVENTIL) (2.5 MG/3ML) 0.083% nebulizer solution Take 3 mLs (2.5 mg total) by nebulization every 6 (six) hours as needed for wheezing or shortness of breath. 03/07/16   Tanda Rockers, MD  aspirin EC 81 MG EC tablet Take 1 tablet (81 mg total) by mouth daily. 07/17/16   Erlene Quan, PA-C  atorvastatin  (LIPITOR) 80 MG tablet Take 1 tablet (80 mg total) by mouth daily at 6 PM. 07/16/16   Kilroy, Doreene Burke, PA-C  budesonide-formoterol [facility]) 160-4.5 MCG/ACT inhaler Inhale 2 puffs into the lungs 2 (two) times daily. 03/13/16   Chesley Mires, MD  calcium carbonate (TUMS EX) 750 MG chewable tablet Chew 1 tablet by mouth at bedtime as needed for heartburn (acid indigestion).    [provider]  levothyroxine (SYNTHROID, LEVOTHROID) 137 MCG tablet Take 137 mcg by mouth daily before breakfast.    [provider]  lisinopril (PRINIVIL,ZESTRIL) 2.5 MG tablet Take 1 tablet (2.5 mg total) by mouth daily. 07/16/16   Erlene Quan, PA-C  metoprolol tartrate (LOPRESSOR) 25 MG tablet Take 0.5 tablets (12.5 mg total) by mouth 2 (two) times daily. 07/16/16   Erlene Quan, PA-C  nitroGLYCERIN (NITROSTAT) 0.4 MG SL tablet Place 1 tablet (0.4 mg total) under the tongue every 5 (five) minutes as needed for chest pain (CP or SOB). 07/16/16   Erlene Quan, PA-C  pantoprazole (PROTONIX) 40 MG tablet Take 1 tablet (40 mg total) by mouth daily before breakfast. 07/17/16   Rosalyn Gess, Doreene Burke, PA-C  ticagrelor (BRILINTA) 90 MG TABS tablet Take 1 tablet (90 mg total) by mouth 2 (two) times daily. 07/16/16   Erlene Quan, PA-C    Family History Family History  Problem Relation Age of Onset  . Asthma Mother   . Heart disease Mother   . Asthma Sister   . Heart disease Sister   . Breast cancer Sister 48  . Heart disease Father   . Kidney disease Father   . Prostate cancer Brother        dx. 13s  . Heart attack Brother 54  . Alzheimer's disease Brother 81  . Breast cancer Maternal Aunt 70  . Stomach cancer Maternal Grandfather 60  . Asthma Sister   . Memory loss Sister   . Heart attack Son 27  . Spinal muscular atrophy Grandchild        type II  . Cancer Other 62       niece dx. ca of salivary gland   . Breast cancer Other 33       niece; negative genetic testing 10 years ago  . Melanoma Other   .  Alzheimer's disease Maternal Aunt   . Cervical cancer Cousin        maternal 1st cousin dx. at 72  . Breast cancer Cousin 63       maternal 1st cousin  . Lung cancer Cousin 45       maternal 1st cousin; smoker  . Heart disease Cousin   . Leukemia Cousin 14       paternal 1st cousin  . Breast cancer Other        paternal great grandmother (PGF's mother) dx. late 42s-early 31s  . Coronary artery disease Unknown     Social History Social History  Substance Use Topics  . Smoking status: Never Smoker  . Smokeless tobacco: Never Used  . Alcohol use Yes     Comment: rarely     Allergies   Adenosine; Contrast media [iodinated diagnostic agents]; Codeine sulfate; Latex; and Adhesive [tape]   Review of Systems Review of Systems  Constitutional: Positive for fatigue. Negative for fever.  Respiratory: Positive for shortness of breath (DOE). Negative for cough.   Cardiovascular: Positive for chest pain (pressure with exertion).  Gastrointestinal: Positive for abdominal pain and melena. Negative for constipation, hematochezia, nausea and vomiting.  Genitourinary: Negative for dysuria.   All other systems are reviewed and are negative for acute change except as noted in the HPI   Physical Exam Updated Vital Signs BP 135/70 (BP Location: Right Arm)   Pulse 93   Temp 98.4 F (36.9 C) (Oral)   Resp 18   Ht 5' 4.75" (1.645 m)   Wt 149 lb (67.6 kg)   SpO2 99%   BMI 24.99 kg/m   Physical Exam  Constitutional: She is oriented to person, place, and time. She appears well-developed and well-nourished. No distress.  HENT:  Head: Normocephalic and atraumatic.  Nose: Nose normal.  Eyes: Conjunctivae and EOM are normal. Pupils are equal, round, and reactive to light. Right eye exhibits no discharge. Left eye exhibits no discharge. No scleral icterus.  Neck: Normal range of motion. Neck supple.  Cardiovascular: Normal rate and regular rhythm.  Exam reveals no gallop and no friction  rub.   No murmur heard. Pulmonary/Chest: Effort normal and breath sounds normal. No stridor. No respiratory distress. She has no rales.  Abdominal: Soft. She exhibits no distension. There is no tenderness.  Musculoskeletal: She exhibits no edema or tenderness.  Neurological: She is alert and oriented to person, place, and time.  Skin: Skin is warm and dry. No rash noted. She is not diaphoretic. No erythema.  Psychiatric: She has a normal mood and affect.  Vitals reviewed.    ED Treatments / Results  Labs (all labs ordered are listed, but only abnormal results are displayed) Labs Reviewed  COMPREHENSIVE METABOLIC PANEL - Abnormal; Notable for the following:       Result Value   Glucose, Bld 114 (*)    BUN 29 (*)    Creatinine, Ser 1.34 (*)    Total Protein 6.4 (*)    ALT 10 (*)    Total Bilirubin 0.2 (*)    GFR calc non Af Amer 37 (*)    GFR calc Af Amer 43 (*)    All other components within normal limits  CBC - Abnormal; Notable for the following:    Hemoglobin 11.6 (*)    RDW 16.0 (*)    All other components within normal limits  POC OCCULT BLOOD, ED - Abnormal; Notable for the following:    Fecal Occult Bld POSITIVE (*)    All other components within normal limits  I-STAT TROPOININ, ED  TYPE AND SCREEN  ABO/RH    EKG  EKG Interpretation  Date/Time:  Tuesday Jul 24 2016 18:56:59 EDT Ventricular Rate:  100 PR Interval:  122 QRS Duration: 88 QT Interval:  360 QTC Calculation: 464 R Axis:   71 Text Interpretation:  Normal sinus rhythm Nonspecific T wave abnormality new from prior Abnormal ECG No STEMI   Confirmed by [facility] MD, Beckie Viscardi ([id number]) on 07/24/2016 9:58:23 PM       Radiology Dg Chest 2 View  Result Date: 07/24/2016 CLINICAL DATA:  Chest pain.  Shortness of breath and chest tightness. EXAM: CHEST  2 VIEW COMPARISON:  Radiograph 07/13/2016 FINDINGS: Stable heart size and mediastinal contours with large retrocardiac hiatal hernia. Atherosclerosis noted of the  aortic arch. Cardiac stent is visualized. There is no pulmonary edema, pleural effusion, focal airspace disease or pneumothorax. Minimal biapical pleuroparenchymal scarring. No acute osseous abnormalities. IMPRESSION: 1. No acute abnormality.  Coronary stent has been placed. 2. Thoracic aortic atherosclerosis. 3. Large hiatal hernia, unchanged in radiographic appearance. Electronically Signed   By: Jeb Levering M.D.   On: 07/24/2016 22:33    Procedures Procedures (including critical care time)  Medications Ordered in ED Medications  0.9 %  sodium chloride infusion (not administered)  pantoprazole (PROTONIX) EC tablet 40 mg (not administered)  metoprolol tartrate (LOPRESSOR) tablet 12.5 mg (not administered)  levothyroxine (SYNTHROID, LEVOTHROID) tablet 137 mcg (not administered)  atorvastatin (LIPITOR) tablet 80 mg (not administered)  fluticasone furoate-vilanterol (BREO ELLIPTA) 200-25 MCG/INH 1 puff (not administered)  albuterol (PROVENTIL) (2.5 MG/3ML) 0.083% nebulizer solution 2.5 mg (not administered)  aspirin EC tablet 81 mg (not administered)  ticagrelor (BRILINTA) tablet 90 mg (not administered)  acetaminophen (TYLENOL) tablet 650 mg (not administered)     Initial Impression / Assessment and Plan / ED Course  I have reviewed the triage vital signs and the nursing notes.  Pertinent labs & imaging results that were available during my care of the patient were reviewed by me and considered in my medical decision making (see chart for details).     1. Chest pain Exertional chest pain which patient attributes to her asthma however patient had recent NSTEMI with 95% blockage in the RCA requiring stenting. EKG today shows new T-wave changes in the septal leads. Initial troponin negative. Likely angina due to her recent MI however will require admission to trend troponins and rule out ACS from restenosis or new blockage.  Chest x-ray without evidence suggestive of pneumonia,  pneumothorax, pneumomediastinum.  No abnormal contour of the mediastinum to suggest dissection. No evidence of acute injuries.  Presentation was not concerning for pulmonary embolism. Not classic for aortic dissection or esophageal perforation.  2. GI Bleed Positive stool guaiac. Hemoglobin stable however patient was recently started on Brilinta due to her non-STEMI and will require admission to either identify the source or addressed antiplatelet medication.  Discussed case with hospitalist who will admit the patient for further workup and management.  Final Clinical Impressions(s) / ED Diagnoses   Final diagnoses:  Chest pain  Acute GI bleeding      Chiante Peden, Grayce Sessions, MD 07/24/16 2319

## 2016-07-24 NOTE — ED Triage Notes (Signed)
Per Pt, Pt had a stent placed here on 5/6. Pt was discharged home on 5/7 with new medications. Pt reports about two days ago having black tarry stools. Pt went to [facility] for evaluation and blood was noted in her stool. Pt complains of chest tightness and SOB at this time as well.

## 2016-07-24 NOTE — Telephone Encounter (Signed)
S/w pt she states that she would like this testing done. Notified for pt to call PCp for this-test not cardiac related

## 2016-07-24 NOTE — H&P (Signed)
History and Physical    Sonia [name] FVC:[id number] DOB: 1940/03/23 DOA: 07/24/2016  PCP: [name], MD  Patient coming from: Home  I have personally briefly reviewed patient's old medical records in [place]  Chief Complaint: Melena  HPI: Sonia [name] is a 76 y.o. female with medical history significant of CAD.  Patient actually just had NSTEMI last week which was treated with PCI with stent on 07/15/16.  Patient was discharged on ASA and Brilinta.  Patient developed black, tarry stool that had distinctive odor of GIB per patient 2 days ago.  She has a h/o Melena once in the past a couple of years ago but upper EGD was unable to locate source and this resolved on its own.  She admits she was not taking PPI after the NSTEMI admission.  She presented to PCPs office today with stool sample which tested positive for blood and was sent in to the ED.  She notes that Melena has actually been improving today and although stools were initially loose, they have become formed today and last BM was 12 hours ago now.   ED Course: HGB 11.6 (10.5 on discharge).  Creat 1.3 up from 0.9 on discharge.   Review of Systems: As per HPI otherwise 10 point review of systems negative.   Past Medical History:  Diagnosis Date  . Arthritis   . Asthma    daily and prn inhalers; states good control currently  . COPD (chronic obstructive pulmonary disease) ([place])   . GERD (gastroesophageal reflux disease)    no current med.  . Hashimoto's thyroiditis   . Headache    allergies; silent migraines  . Hiatal hernia   . Hypothyroidism   . Pneumonia   . 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 Vitamin D deficiency     Past Surgical History:  Procedure Laterality Date  . ABDOMINAL HYSTERECTOMY     partial  . APPENDECTOMY    . BREAST CYST EXCISION Left   . BREAST LUMPECTOMY WITH RADIOACTIVE SEED LOCALIZATION Left 04/07/2015   Procedure: BREAST LUMPECTOMY WITH RADIOACTIVE SEED LOCALIZATION;   Surgeon: Erroll Luna, MD;  Location: [facility];  Service: General;  Laterality: Left;  . CORONARY STENT INTERVENTION N/A 07/15/2016   Procedure: Coronary Stent Intervention;  Surgeon: Sherren Mocha, MD;  Location: [facility];  Service: Cardiovascular;  Laterality: N/A;  . KNEE ARTHROSCOPY Right 12/07/2003  . LAPAROSCOPIC SALPINGO OOPHERECTOMY Right 09/08/2003  . LEFT HEART CATH N/A 07/15/2016   Procedure: Left Heart Cath;  Surgeon: Sherren Mocha, MD;  Location: [facility];  Service: Cardiovascular;  Laterality: N/A;  . LYSIS OF ADHESION  09/08/2003  . NASAL SINUS SURGERY  age 67  . RECTAL POLYPECTOMY  10/02/2006     reports that she has never smoked. She has never used smokeless tobacco. She reports that she drinks alcohol. She reports that she does not use drugs.  Allergies  Allergen Reactions  . Adenosine Shortness Of Breath  . Contrast Media [Iodinated Diagnostic Agents] Anaphylaxis  . Codeine Sulfate Nausea And Vomiting  . Latex Other (See Comments)    Skin irritation  . Adhesive [Tape] Other (See Comments)    SKIN IRRITATION    Family History  Problem Relation Age of Onset  . Asthma Mother   . Heart disease Mother   . Asthma Sister   . Heart disease Sister   . Breast cancer Sister 76  . Heart disease Father   . Kidney disease  Father   . Prostate cancer Brother        dx. 3s  . Heart attack Brother 31  . Alzheimer's disease Brother 41  . Breast cancer Maternal Aunt 70  . Stomach cancer Maternal Grandfather 60  . Asthma Sister   . Memory loss Sister   . Heart attack Son 33  . Spinal muscular atrophy Grandchild        type II  . Cancer Other 19       niece dx. ca of salivary gland   . Breast cancer Other 43       niece; negative genetic testing 10 years ago  . Melanoma Other   . Alzheimer's disease Maternal Aunt   . Cervical cancer Cousin        maternal 1st cousin dx. at 37  . Breast cancer Cousin 47       maternal 1st cousin    . Lung cancer Cousin 80       maternal 1st cousin; smoker  . Heart disease Cousin   . Leukemia Cousin 14       paternal 1st cousin  . Breast cancer Other        paternal great grandmother (PGF's mother) dx. late 79s-early 42s  . Coronary artery disease Unknown      Prior to Admission medications   Medication Sig Start Date End Date Taking? Authorizing Provider  acetaminophen (TYLENOL) 325 MG tablet Take 2 tablets (650 mg total) by mouth every 6 (six) hours as needed for mild pain, fever or headache. 07/16/16   Rosalyn Gess, Doreene Burke, PA-C  albuterol (PROAIR HFA) 108 (90 Base) MCG/ACT inhaler Inhale 2 puffs into the lungs every 6 (six) hours as needed for wheezing or shortness of breath. 07/16/16   Erlene Quan, PA-C  albuterol (PROVENTIL) (2.5 MG/3ML) 0.083% nebulizer solution Take 3 mLs (2.5 mg total) by nebulization every 6 (six) hours as needed for wheezing or shortness of breath. 03/07/16   Tanda Rockers, MD  aspirin EC 81 MG EC tablet Take 1 tablet (81 mg total) by mouth daily. 07/17/16   Erlene Quan, PA-C  atorvastatin (LIPITOR) 80 MG tablet Take 1 tablet (80 mg total) by mouth daily at 6 PM. 07/16/16   Kilroy, Doreene Burke, PA-C  budesonide-formoterol [facility]) 160-4.5 MCG/ACT inhaler Inhale 2 puffs into the lungs 2 (two) times daily. 03/13/16   Chesley Mires, MD  calcium carbonate (TUMS EX) 750 MG chewable tablet Chew 1 tablet by mouth at bedtime as needed for heartburn (acid indigestion).    [provider]  levothyroxine (SYNTHROID, LEVOTHROID) 137 MCG tablet Take 137 mcg by mouth daily before breakfast.    [provider]  lisinopril (PRINIVIL,ZESTRIL) 2.5 MG tablet Take 1 tablet (2.5 mg total) by mouth daily. 07/16/16   Erlene Quan, PA-C  metoprolol tartrate (LOPRESSOR) 25 MG tablet Take 0.5 tablets (12.5 mg total) by mouth 2 (two) times daily. 07/16/16   Erlene Quan, PA-C  nitroGLYCERIN (NITROSTAT) 0.4 MG SL tablet Place 1 tablet (0.4 mg total) under the tongue every 5  (five) minutes as needed for chest pain (CP or SOB). 07/16/16   Erlene Quan, PA-C  pantoprazole (PROTONIX) 40 MG tablet Take 1 tablet (40 mg total) by mouth daily before breakfast. 07/17/16   Rosalyn Gess, Doreene Burke, PA-C  ticagrelor (BRILINTA) 90 MG TABS tablet Take 1 tablet (90 mg total) by mouth 2 (two) times daily. 07/16/16   Erlene Quan, PA-C    Physical  Exam: Vitals:   07/24/16 1851 07/24/16 2103 07/24/16 2200 07/24/16 2230  BP:  135/70 140/82 (!) 156/74  Pulse:  93 88 81  Resp:  18 16 13   Temp:  98.4 F (36.9 C)    TempSrc:  Oral    SpO2:  99% 100% 100%  Weight: 67.6 kg (149 lb)     Height: 5' 4.75" (1.645 m)       Constitutional: NAD, calm, comfortable Eyes: PERRL, lids and conjunctivae normal ENMT: Mucous membranes are moist. Posterior pharynx clear of any exudate or lesions.Normal dentition.  Neck: normal, supple, no masses, no thyromegaly Respiratory: clear to auscultation bilaterally, no wheezing, no crackles. Normal respiratory effort. No accessory muscle use.  Cardiovascular: Regular rate and rhythm, no murmurs / rubs / gallops. No extremity edema. 2+ pedal pulses. No carotid bruits.  Abdomen: no tenderness, no masses palpated. No hepatosplenomegaly. Bowel sounds positive.  Musculoskeletal: no clubbing / cyanosis. No joint deformity upper and lower extremities. Good ROM, no contractures. Normal muscle tone.  Skin: no rashes, lesions, ulcers. No induration Neurologic: CN 2-12 grossly intact. Sensation intact, DTR normal. Strength 5/5 in all 4.  Psychiatric: Normal judgment and insight. Alert and oriented x 3. Normal mood.    Labs on Admission: I have personally reviewed following labs and imaging studies  CBC:  Recent Labs Lab 07/24/16 1903  WBC 9.5  HGB 11.6*  HCT 36.6  MCV 83.0  PLT 388   Basic Metabolic Panel:  Recent Labs Lab 07/24/16 1903  NA 140  K 4.2  CL 109  CO2 22  GLUCOSE 114*  BUN 29*  CREATININE 1.34*  CALCIUM 9.1   GFR: Estimated  Creatinine Clearance: 31.3 mL/min (A) (by C-G formula based on SCr of 1.34 mg/dL (H)). Liver Function Tests:  Recent Labs Lab 07/24/16 1903  AST 22  ALT 10*  ALKPHOS 76  BILITOT 0.2*  PROT 6.4*  ALBUMIN 3.5   No results for input(s): LIPASE, AMYLASE in the last 168 hours. No results for input(s): AMMONIA in the last 168 hours. Coagulation Profile: No results for input(s): INR, PROTIME in the last 168 hours. Cardiac Enzymes: No results for input(s): CKTOTAL, CKMB, CKMBINDEX, TROPONINI in the last 168 hours. BNP (last 3 results) No results for input(s): PROBNP in the last 8760 hours. HbA1C: No results for input(s): HGBA1C in the last 72 hours. CBG: No results for input(s): GLUCAP in the last 168 hours. Lipid Profile: No results for input(s): CHOL, HDL, LDLCALC, TRIG, CHOLHDL, LDLDIRECT in the last 72 hours. Thyroid Function Tests: No results for input(s): TSH, T4TOTAL, FREET4, T3FREE, THYROIDAB in the last 72 hours. Anemia Panel: No results for input(s): VITAMINB12, FOLATE, FERRITIN, TIBC, IRON, RETICCTPCT in the last 72 hours. Urine analysis:    Component Value Date/Time   COLORURINE Yellow 11/28/2010 1422   APPEARANCEUR Sl Cloudy 11/28/2010 1422   LABSPEC >=1.030 11/28/2010 1422   PHURINE 5.5 11/28/2010 1422   GLUCOSEU NEGATIVE 11/28/2010 1422   HGBUR NEGATIVE 11/28/2010 1422   BILIRUBINUR NEGATIVE 11/28/2010 1422   KETONESUR NEGATIVE 11/28/2010 1422   UROBILINOGEN 0.2 11/28/2010 1422   NITRITE NEGATIVE 11/28/2010 1422   LEUKOCYTESUR SMALL 11/28/2010 1422    Radiological Exams on Admission: Dg Chest 2 View  Result Date: 07/24/2016 CLINICAL DATA:  Chest pain. Shortness of breath and chest tightness. EXAM: CHEST  2 VIEW COMPARISON:  Radiograph 07/13/2016 FINDINGS: Stable heart size and mediastinal contours with large retrocardiac hiatal hernia. Atherosclerosis noted of the aortic arch. Cardiac stent is visualized. There  is no pulmonary edema, pleural effusion, focal  airspace disease or pneumothorax. Minimal biapical pleuroparenchymal scarring. No acute osseous abnormalities. IMPRESSION: 1. No acute abnormality.  Coronary stent has been placed. 2. Thoracic aortic atherosclerosis. 3. Large hiatal hernia, unchanged in radiographic appearance. Electronically Signed   By: Jeb Levering M.D.   On: 07/24/2016 22:33    EKG: Independently reviewed.  Assessment/Plan Principal Problem:   Melena Active Problems:   CAD S/P percutaneous coronary angioplasty   Acute renal insufficiency    1. Melena - 1. Mild GIB at this point, formed stool 12 hours ago 2. HGB is stable since discharge despite 2 days of symptoms 3. PPI 4. Clear liquid diet 5. Call GI in AM 2. CAD s/p PCI with stent 9 days ago - 1. At this point risks of stopping anti-platelet agents would seem to outweigh risk of worsening bleed with continuing them.  Feel patient would be at high risk for development of ISR a mere 9 days after stent placement if we stopped these agents. 2. Will therefore continue ASA and Brilinta for the moment. 3. Stop if GIB severely worsens. 4. Discussed above with patient.  All questions answered. 3. Acute renal insufficiency - very mild 1. Will gently hydrate with NS 2. Will hold ACEi 3. Repeat BMP in AM  DVT prophylaxis: SCDs Code Status: Full Family Communication: No family in room Disposition Plan: Home after admit Consults called: None Admission status: Admit to inpatient - inpatient status due to increased complexity of GIB with ongoing use of concurrent blood thinners.   Etta Quill DO [facility] Pager [phone]  If 7AM-7PM, please contact day team taking care of patient [email or web address] Password [email or web address]  07/24/2016, 11:40 PM

## 2016-07-24 NOTE — ED Notes (Signed)
Dr. Gardner at bedside 

## 2016-07-25 ENCOUNTER — Encounter (HOSPITAL_COMMUNITY)
Admission: EM | Disposition: A | Discharge status: Home / Self Care | Payer: Self-pay | Source: Home / Self Care | Attending: Internal Medicine

## 2016-07-25 ENCOUNTER — Inpatient Hospital Stay (HOSPITAL_COMMUNITY): Payer: Medicare Other | Admitting: Anesthesiology

## 2016-07-25 ENCOUNTER — Encounter (HOSPITAL_COMMUNITY): Payer: Self-pay | Admitting: General Practice

## 2016-07-25 DIAGNOSIS — I2510 Atherosclerotic heart disease of native coronary artery without angina pectoris: Secondary | ICD-10-CM

## 2016-07-25 DIAGNOSIS — K922 Gastrointestinal hemorrhage, unspecified: Secondary | ICD-10-CM | POA: Insufficient documentation

## 2016-07-25 DIAGNOSIS — Z9861 Coronary angioplasty status: Secondary | ICD-10-CM

## 2016-07-25 DIAGNOSIS — N179 Acute kidney failure, unspecified: Secondary | ICD-10-CM

## 2016-07-25 HISTORY — DX: Gastrointestinal hemorrhage, unspecified: K92.2

## 2016-07-25 HISTORY — PX: ESOPHAGOGASTRODUODENOSCOPY (EGD) WITH PROPOFOL: SHX5813

## 2016-07-25 LAB — CBC
HEMATOCRIT: 33.5 % — AB (ref 36.0–46.0)
Hemoglobin: 10.7 g/dL — ABNORMAL LOW (ref 12.0–15.0)
MCH: 26.3 pg (ref 26.0–34.0)
MCHC: 31.9 g/dL (ref 30.0–36.0)
MCV: 82.3 fL (ref 78.0–100.0)
PLATELETS: 161 10*3/uL (ref 150–400)
RBC: 4.07 MIL/uL (ref 3.87–5.11)
RDW: 16.2 % — ABNORMAL HIGH (ref 11.5–15.5)
WBC: 7.1 10*3/uL (ref 4.0–10.5)

## 2016-07-25 LAB — BASIC METABOLIC PANEL
Anion gap: 6 (ref 5–15)
BUN: 19 mg/dL (ref 6–20)
CHLORIDE: 110 mmol/L (ref 101–111)
CO2: 24 mmol/L (ref 22–32)
Calcium: 8.5 mg/dL — ABNORMAL LOW (ref 8.9–10.3)
Creatinine, Ser: 0.95 mg/dL (ref 0.44–1.00)
GFR calc Af Amer: >60 mL/min (ref >60)
GFR calc non Af Amer: 56 mL/min — ABNORMAL LOW (ref >60)
GLUCOSE: 107 mg/dL — AB (ref 65–99)
POTASSIUM: 3.9 mmol/L (ref 3.5–5.1)
Sodium: 140 mmol/L (ref 135–145)

## 2016-07-25 LAB — GLUCOSE, CAPILLARY: Glucose-Capillary: 103 mg/dL — ABNORMAL HIGH (ref 65–99)

## 2016-07-25 SURGERY — ESOPHAGOGASTRODUODENOSCOPY (EGD) WITH PROPOFOL
Anesthesia: Monitor Anesthesia Care | Laterality: Left

## 2016-07-25 MED ORDER — PROPOFOL 10 MG/ML IV BOLUS
INTRAVENOUS | Status: DC | PRN
  Administered 2016-07-25 (×3): 20 mg via INTRAVENOUS

## 2016-07-25 MED ORDER — FENTANYL CITRATE (PF) 100 MCG/2ML IJ SOLN: 25.0000 ug | INTRAMUSCULAR | Status: DC | PRN

## 2016-07-25 MED ORDER — CLOPIDOGREL BISULFATE 75 MG PO TABS
75.0000 mg | ORAL_TABLET | Freq: Every day | ORAL | Status: DC
  Administered 2016-07-26: 75 mg via ORAL
  Filled 2016-07-25: qty 1

## 2016-07-25 MED ORDER — LIDOCAINE HCL (CARDIAC) 20 MG/ML IV SOLN
INTRAVENOUS | Status: DC | PRN
  Administered 2016-07-25 (×2): 20 mg via INTRATRACHEAL

## 2016-07-25 MED ORDER — SODIUM CHLORIDE 0.9 % IV SOLN: INTRAVENOUS | Status: DC

## 2016-07-25 MED ORDER — ESMOLOL HCL 100 MG/10ML IV SOLN
INTRAVENOUS | Status: DC | PRN
  Administered 2016-07-25: 2 mg via INTRAVENOUS

## 2016-07-25 NOTE — Progress Notes (Signed)
Received report from [place], [place] in ED for transfer of pt to [phone]

## 2016-07-25 NOTE — Op Note (Signed)
EGD demonstrated a large hiatal hernia, erosions in the gastric fundus and antrum and a diminutive duodenal bulb ulcer. Recommend use of PPI for the next 8 weeks. Okay to resume the patient on heart healthy diet. Okay to DC in a.m. if hemoglobin remains stable.  Gari Crown [phone]

## 2016-07-25 NOTE — Consult Note (Signed)
[facility] Consult  Referring Provider: Leona Carry Primary Care Physician:  [name], MD Primary Gastroenterologist: Dr.Buccine  Reason for Consultation:  Anemia, dark stools  HPI: Sonia [name] is a 76 y.o. Caucasian  female was admitted on 5/1/518 with c/o black, tarry stools for 2 and a half days. She recently underwent LHC for a NSTEMI, underwent PCI and stent placement on 07/15/16 and was discharged on ASA and Brilinta. For the past 2 days, she reports black, sticky stools with the odor of blood. This was not associated with chest pain, dizziness, loss of consciousness. She denies abdominal pain, nausea, vomiting, acid reflux, difficulty or pain on swallowing. She denies use of other NSAIDs. She reports having an EGD and a colonoscopy by Dr.Buccini 3 years ago. States that EGD was unremarkable except for a hiatal hernia, on colonoscopy she had a large tubular adenoma and needed to be referred for ?endoscopic mucosal resection for complete removal of the colonic polyp. Normally, she has regular Bms and denies diarrhea or constipation. She denies recent weight loss, loss of appetite, bloating or early satiety.   Past Medical History:  Diagnosis Date  . Arthritis   . Asthma    daily and prn inhalers; states good control currently  . COPD (chronic obstructive pulmonary disease) ([place])   . GERD (gastroesophageal reflux disease)    no current med.  . Hashimoto's thyroiditis   . Headache    allergies; silent migraines  . Hiatal hernia   . Hypothyroidism   . Pneumonia   . 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 Vitamin D deficiency     Past Surgical History:  Procedure Laterality Date  . ABDOMINAL HYSTERECTOMY     partial  . APPENDECTOMY    . BREAST CYST EXCISION Left   . BREAST LUMPECTOMY WITH RADIOACTIVE SEED LOCALIZATION Left 04/07/2015   Procedure: BREAST LUMPECTOMY WITH RADIOACTIVE SEED LOCALIZATION;  Surgeon: Erroll Luna, MD;  Location:  [facility];  Service: General;  Laterality: Left;  . CORONARY STENT INTERVENTION N/A 07/15/2016   Procedure: Coronary Stent Intervention;  Surgeon: Sherren Mocha, MD;  Location: [facility];  Service: Cardiovascular;  Laterality: N/A;  . KNEE ARTHROSCOPY Right 12/07/2003  . LAPAROSCOPIC SALPINGO OOPHERECTOMY Right 09/08/2003  . LEFT HEART CATH N/A 07/15/2016   Procedure: Left Heart Cath;  Surgeon: Sherren Mocha, MD;  Location: [facility];  Service: Cardiovascular;  Laterality: N/A;  . LYSIS OF ADHESION  09/08/2003  . NASAL SINUS SURGERY  age 10  . RECTAL POLYPECTOMY  10/02/2006    Prior to Admission medications   Medication Sig Start Date End Date Taking? Authorizing Provider  acetaminophen (TYLENOL) 325 MG tablet Take 2 tablets (650 mg total) by mouth every 6 (six) hours as needed for mild pain, fever or headache. 07/16/16  Yes Kilroy, Luke K, PA-C  albuterol (PROAIR HFA) 108 (90 Base) MCG/ACT inhaler Inhale 2 puffs into the lungs every 6 (six) hours as needed for wheezing or shortness of breath. 07/16/16  Yes Kilroy, Luke K, PA-C  albuterol (PROVENTIL) (2.5 MG/3ML) 0.083% nebulizer solution Take 3 mLs (2.5 mg total) by nebulization every 6 (six) hours as needed for wheezing or shortness of breath. 03/07/16  Yes Tanda Rockers, MD  aspirin EC 81 MG EC tablet Take 1 tablet (81 mg total) by mouth daily. 07/17/16  Yes Kilroy, Luke K, PA-C  atorvastatin (LIPITOR) 80 MG tablet Take 1 tablet (80 mg total) by mouth daily at 6 PM. 07/16/16  Yes Erlene Quan, PA-C  budesonide-formoterol (SYMBICORT) 160-4.5 MCG/ACT inhaler Inhale 2 puffs into the lungs 2 (two) times daily. 03/13/16  Yes Chesley Mires, MD  calcium carbonate (TUMS EX) 750 MG chewable tablet Chew 1 tablet by mouth at bedtime as needed for heartburn (acid indigestion).   Yes [provider]  levothyroxine (SYNTHROID, LEVOTHROID) 137 MCG tablet Take 137 mcg by mouth daily before breakfast.   Yes [provider]  lisinopril (PRINIVIL,ZESTRIL) 2.5 MG tablet Take 1 tablet (2.5 mg total) by mouth daily. 07/16/16  Yes Kilroy, Luke K, PA-C  metoprolol tartrate (LOPRESSOR) 25 MG tablet Take 0.5 tablets (12.5 mg total) by mouth 2 (two) times daily. 07/16/16  Yes Kilroy, Luke K, PA-C  nitroGLYCERIN (NITROSTAT) 0.4 MG SL tablet Place 1 tablet (0.4 mg total) under the tongue every 5 (five) minutes as needed for chest pain (CP or SOB). 07/16/16  Yes Kilroy, Luke K, PA-C  pantoprazole (PROTONIX) 40 MG tablet Take 1 tablet (40 mg total) by mouth daily before breakfast. 07/17/16  Yes Kilroy, Doreene Burke, PA-C  ticagrelor (BRILINTA) 90 MG TABS tablet Take 1 tablet (90 mg total) by mouth 2 (two) times daily. 07/16/16  Yes Erlene Quan, PA-C    Current Facility-Administered Medications  Medication Dose Route Frequency Provider Last Rate Last Dose  . 0.9 %  sodium chloride infusion   Intravenous Continuous Etta Quill, DO 75 mL/hr at 07/25/16 0112    . acetaminophen (TYLENOL) tablet 650 mg  650 mg Oral Q6H PRN Etta Quill, DO      . albuterol (PROVENTIL) (2.5 MG/3ML) 0.083% nebulizer solution 2.5 mg  2.5 mg Nebulization Q6H PRN Etta Quill, DO      . aspirin EC tablet 81 mg  81 mg Oral Daily Jennette Kettle M, DO   81 mg at 07/25/16 9767  . atorvastatin (LIPITOR) tablet 80 mg  80 mg Oral q1800 Etta Quill, DO      . fluticasone furoate-vilanterol (BREO ELLIPTA) 200-25 MCG/INH 1 puff  1 puff Inhalation Daily Jennette Kettle M, DO      . levothyroxine (SYNTHROID, LEVOTHROID) tablet 137 mcg  137 mcg Oral QAC breakfast Etta Quill, DO   137 mcg at 07/25/16 [phone]  . metoprolol tartrate (LOPRESSOR) tablet 12.5 mg  12.5 mg Oral BID Etta Quill, DO   Stopped at 07/25/16 [phone]  . pantoprazole (PROTONIX) EC tablet 40 mg  40 mg Oral QAC breakfast Jennette Kettle M, DO   40 mg at 07/25/16 2409  . ticagrelor (BRILINTA) tablet 90 mg  90 mg Oral BID Etta Quill, DO   90 mg at 07/25/16 7353    Allergies  as of 07/24/2016 - Review Complete 07/24/2016  Allergen Reaction Noted  . Adenosine Shortness Of Breath   . Contrast media [iodinated diagnostic agents] Anaphylaxis 03/31/2015  . Codeine sulfate Nausea And Vomiting   . Latex Other (See Comments) 03/31/2015  . Adhesive [tape] Other (See Comments) 03/31/2015    Family History  Problem Relation Age of Onset  . Asthma Mother   . Heart disease Mother   . Asthma Sister   . Heart disease Sister   . Breast cancer Sister 103  . Heart disease Father   . Kidney disease Father   . Prostate cancer Brother        dx. 11s  . Heart attack Brother 27  . Alzheimer's disease Brother 45  . Breast cancer Maternal Aunt 70  . Stomach cancer  Maternal Grandfather 60  . Asthma Sister   . Memory loss Sister   . Heart attack Son 55  . Spinal muscular atrophy Grandchild        type II  . Cancer Other 81       niece dx. ca of salivary gland   . Breast cancer Other 63       niece; negative genetic testing 10 years ago  . Melanoma Other   . Alzheimer's disease Maternal Aunt   . Cervical cancer Cousin        maternal 1st cousin dx. at 67  . Breast cancer Cousin 76       maternal 1st cousin  . Lung cancer Cousin 35       maternal 1st cousin; smoker  . Heart disease Cousin   . Leukemia Cousin 14       paternal 1st cousin  . Breast cancer Other        paternal great grandmother (PGF's mother) dx. late 29s-early 39s  . Coronary artery disease Unknown     Social History   Social History  . Marital status: Divorced    Spouse name: N/A  . Number of children: 2  . Years of education: N/A   Occupational History  . 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 Smoking status: Never Smoker  . Smokeless tobacco: Never Used  . Alcohol use Yes     Comment: rarely  . Drug use: No  . Sexual activity: Not on file   Other Topics Concern  . Not on file   Social History Narrative  . No narrative on file     Review of Systems: GI: Described in detail in HPI.    Gen: Denies any fever, chills, rigors, night sweats, anorexia, fatigue, weakness, malaise, involuntary weight loss, and sleep disorder LA:GTXMIW chest pain and numbness of both upper extremities when she had the NSTEMI, otherwise currently denies chest pain, angina, palpitations, syncope, orthopnea, PND, peripheral edema, and claudication. Resp: Denies dyspnea, cough, sputum, wheezing, coughing up blood. GU : Denies urinary burning, blood in urine, urinary frequency, urinary hesitancy, nocturnal urination, and urinary incontinence. MS: Denies joint pain or swelling.  Denies muscle weakness, cramps, atrophy.  Derm: Denies rash, itching, oral ulcerations, hives, unhealing ulcers.  Psych: Denies depression, anxiety, memory loss, suicidal ideation, hallucinations,  and confusion. Heme: Denies bruising, and enlarged lymph nodes. Neuro:  Denies any headaches, dizziness, paresthesias. Endo:  Denies any problems with DM, thyroid, adrenal function.  Physical Exam: Vital signs in last 24 hours: Temp:  [97.6 F (36.4 C)-98.4 F (36.9 C)] 98.3 F (36.8 C) (05/16 0537) Pulse Rate:  [70-107] 70 (05/16 0842) Resp:  [13-26] 17 (05/16 0537) BP: (119-156)/(48-84) 119/48 (05/16 0842) SpO2:  [96 %-100 %] 96 % (05/16 0537) Weight:  [67.6 kg (149 lb)] 67.6 kg (149 lb) (05/16 0100) Last BM Date: 07/24/16  General:   Alert,  Well-developed, well-nourished, pleasant and cooperative in NAD Head:  Normocephalic and atraumatic. Eyes:  Sclera clear, no icterus.   Conjunctiva slightly pale Ears:  Normal auditory acuity. Nose:  No deformity, discharge,  or lesions. Mouth:  No deformity or lesions.  Oropharynx pink & moist. Neck:  Supple; no masses or thyromegaly. Lungs:  Clear throughout to auscultation.   No wheezes, crackles, or rhonchi. No acute distress. Heart:  Regular rate and rhythm; no murmurs, clicks, rubs,  or gallops. Extremities:  Without  clubbing or edema. Neurologic:  Alert and  oriented x4;  grossly normal neurologically. Skin:  Intact without significant lesions or rashes. Psych:  Alert and cooperative. Normal mood and affect. Abdomen:  Soft, nontender and nondistended. No masses, hepatosplenomegaly or hernias noted. Normal bowel sounds, without guarding, and without rebound.         Lab Results:  Recent Labs  07/24/16 1903  WBC 9.5  HGB 11.6*  HCT 36.6  PLT 190   BMET  Recent Labs  07/24/16 1903  NA 140  K 4.2  CL 109  CO2 22  GLUCOSE 114*  BUN 29*  CREATININE 1.34*  CALCIUM 9.1   LFT  Recent Labs  07/24/16 1903  PROT 6.4*  ALBUMIN 3.5  AST 22  ALT 10*  ALKPHOS 76  BILITOT 0.2*   PT/INR No results for input(s): LABPROT, INR in the last 72 hours.  Studies/Results: Dg Chest 2 View  Result Date: 07/24/2016 CLINICAL DATA:  Chest pain. Shortness of breath and chest tightness. EXAM: CHEST  2 VIEW COMPARISON:  Radiograph 07/13/2016 FINDINGS: Stable heart size and mediastinal contours with large retrocardiac hiatal hernia. Atherosclerosis noted of the aortic arch. Cardiac stent is visualized. There is no pulmonary edema, pleural effusion, focal airspace disease or pneumothorax. Minimal biapical pleuroparenchymal scarring. No acute osseous abnormalities. IMPRESSION: 1. No acute abnormality.  Coronary stent has been placed. 2. Thoracic aortic atherosclerosis. 3. Large hiatal hernia, unchanged in radiographic appearance. Electronically Signed   By: Jeb Levering M.D.   On: 07/24/2016 22:33    Impression: Anemia(Hb 11.6, was 10.5 on d/c on 07/16/16), possible melena(high BUN, Cr ratio 29/1.34),FOBT positive stool, currently on ASA and Brilinta, which needs to be continued as she recently had a stent placement for NSTEMI a week ago. Large hiatal hernia noted on CXR from 07/24/16.  Plan: Diagnostic EGD,DDX includes PUD, AVMs, erosions and less likely malignancy. Will refrain from biopsies as patient  is currently on Brilinta and ASA. Hemodynamically stable.   LOS: 1 day   Ronnette Juniper, MD  07/25/2016, 9:01 AM  Pager [phone] If no answer or after 5 PM call [phone]

## 2016-07-25 NOTE — Transfer of Care (Signed)
Immediate Anesthesia Transfer of Care Note  Patient: Sonia [name]  Procedure(s) Performed: Procedure(s): ESOPHAGOGASTRODUODENOSCOPY (EGD) WITH PROPOFOL (Left)  Patient Location: Endoscopy Unit  Anesthesia Type:MAC  Level of Consciousness: awake, alert  and sedated  Airway & Oxygen Therapy: Patient connected to nasal cannula oxygen  Post-op Assessment: Post -op Vital signs reviewed and stable  Post vital signs: stable  Last Vitals:  Vitals:   07/25/16 0842 07/25/16 1001  BP: (!) 119/48 122/62  Pulse: 70   Resp:  17  Temp:  36.6 C    Last Pain:  Vitals:   07/25/16 1001  TempSrc: Oral  PainSc:          Complications: No apparent anesthesia complications

## 2016-07-25 NOTE — Anesthesia Procedure Notes (Signed)
Procedure Name: MAC Date/Time: 07/25/2016 11:23 AM Performed by: Lavell Luster Pre-anesthesia Checklist: Patient identified, Emergency Drugs available, Suction available, Patient being monitored and Timeout performed Patient Re-evaluated:Patient Re-evaluated prior to inductionOxygen Delivery Method: Nasal cannula Preoxygenation: Pre-oxygenation with 100% oxygen Intubation Type: IV induction

## 2016-07-25 NOTE — Progress Notes (Signed)
Discussed case with Dr Algis Liming. Pt with recent PCI with a DES in the right coronary when she presented with a non-STEMI. Her procedure was done 07/15/2016. She now presents with GI bleeding. Endoscopic evaluation is currently pending. After review of her case, I think it would be best to use a less potent antiplatelet drug. Will discontinue brilinta and start her on clopidogrel 75 mg tomorrow morning. This recommendation is pending her endoscopic evaluation and if there is a major bleeding source we may have to modify the plan. I will come by and see her later today to discuss the recommendation on antiplatelet therapy further.  Sherren Mocha 07/25/2016 9:30 AM

## 2016-07-25 NOTE — Anesthesia Preprocedure Evaluation (Signed)
Anesthesia Evaluation  Patient identified by MRN, date of birth, ID band Patient awake    Reviewed: Allergy & Precautions, H&P , Patient's Chart, lab work & pertinent test results, reviewed documented beta blocker date and time   Airway Mallampati: II  TM Distance: >3 FB Neck ROM: full    Dental no notable dental hx.    Pulmonary    Pulmonary exam normal breath sounds clear to auscultation       Cardiovascular  Rhythm:regular Rate:Normal     Neuro/Psych    GI/Hepatic   Endo/Other    Renal/GU      Musculoskeletal   Abdominal   Peds  Hematology   Anesthesia Other Findings COPD with asthma  Palpitations Acute chest pain Severe asthma with acute exacerbation NSTEMI (non-ST elevated myocardial infarction) (HCC) CAD S/P percutaneous coronary angioplasty    Reproductive/Obstetrics                             Anesthesia Physical Anesthesia Plan  ASA: II  Anesthesia Plan: MAC   Post-op Pain Management:    Induction: Intravenous  Airway Management Planned: Mask and Natural Airway  Additional Equipment:   Intra-op Plan:   Post-operative Plan:   Informed Consent: I have reviewed the patients History and Physical, chart, labs and discussed the procedure including the risks, benefits and alternatives for the proposed anesthesia with the patient or authorized representative who has indicated his/her understanding and acceptance.   Dental Advisory Given  Plan Discussed with: CRNA and Surgeon  Anesthesia Plan Comments:         Anesthesia Quick Evaluation

## 2016-07-25 NOTE — Progress Notes (Addendum)
PROGRESS NOTE   Sonia [name]  XBJ:[id number]    DOB: 24-Dec-1940    DOA: 07/24/2016  PCP: Sonia Orn, MD   I have briefly reviewed patients [facility] medical [facility] in [facility].  Brief Narrative:  76 year old female with PMH of CAD, asthma/COPD, GERD, hypothyroid, hiatal hernia, recent hospitalization 07/13/16-07/16/16 for NSTEMI, cardiology was consulted and underwent PCI with DES to right coronary artery and was discharged on ASA and Brillinta, has scheduled follow-up with cardiology on 07/26/16, now presented with 2 days history of melena without abdominal pain, nausea or vomiting. He remained hemodynamically stable and hemoglobin stable. GI consulted, status post EGD 5/16 which showed a large hiatal hernia, erosions in the gastric fundus and antrum and a diminutive duodenal bulb ulcer. Monitor overnight and if hemoglobin stable, DC home 5/17. Discussed with cardiology.   Assessment & Plan:   Principal Problem:   Melena Active Problems:   CAD S/P percutaneous coronary angioplasty   Acute renal insufficiency   1. Acute upper GI bleed: In the context of recent ASA & Brillinta for CAD s/p PCI. [facility] consulted. Status post EGD 5/16 which showed a large hiatal hernia, erosions in the gastric fundus and antrum and a diminutive duodenal bulb ulcer. GI recommended PPI for next 8 weeks, regular diet and DC 5/17 morning if hemoglobin remains stable. Antiplatelets discussed with cardiology and changes made as below. Discussed with Dr.Karki 2. CAD status post PCI/DES to RCA 07/15/16: Discussed with Dr. Burt Knack, Cardiology who recommended changing Brillinta to Plavix and continue aspirin. Continue statins and beta blockers. Outpatient follow-up with cardiology. 3. Anemia: Stable compared to hemoglobin during recent admission. 4. Acute kidney injury: Resolved. 5. GERD/large hiatal hernia: PPI 6. Asthma/COPD: Stable without bronchospasm. 7. Hypothyroid: Continue Synthroid.   DVT prophylaxis:  SCDs Code Status: Full Family Communication: None at bedside Disposition: DC home possibly 5/17 pending stable hemoglobin.   Consultants:  [facility] Discussed with Dr. Sherren Mocha, Cardiology   Procedures:  EGD 5/16  Antimicrobials:  None    Subjective: Seen this morning prior to EGD. No BM since yesterday morning at approximately 10:30 AM. Denied chest pain, dyspnea, nausea, vomiting or abdominal pain. No dizziness or lightheadedness reported.   ROS: No bright red blood per rectum.  Objective:  Vitals:   07/25/16 0842 07/25/16 1001 07/25/16 1122 07/25/16 1424  BP: (!) 119/48 122/62 (!) 100/55 (!) 103/45  Pulse: 70  83 85  Resp:  17 18 18   Temp:  97.8 F (36.6 C)  97.7 F (36.5 C)  TempSrc:  Oral  Oral  SpO2:  99% 100% 100%  Weight:  67.6 kg (149 lb)    Height:  5' 4.75" (1.645 m)      Examination:  General exam: Pleasant elderly female sitting up comfortably in bed. Respiratory system: Clear to auscultation. Respiratory effort normal. Cardiovascular system: S1 & S2 heard, RRR. No JVD, murmurs, rubs, gallops or clicks. No pedal edema. Telemetry: Sinus rhythm. Gastrointestinal system: Abdomen is nondistended, soft and nontender. No organomegaly or masses felt. Normal bowel sounds heard. Central nervous system: Alert and oriented. No focal neurological deficits. Extremities: Symmetric 5 x 5 power. Skin: No rashes, lesions or ulcers Psychiatry: Judgement and insight appear normal. Mood & affect appropriate.     Data Reviewed: I have personally reviewed following labs and imaging studies  CBC:  Recent Labs Lab 07/24/16 1903 07/25/16 0832  WBC 9.5 7.1  HGB 11.6* 10.7*  HCT 36.6 33.5*  MCV 83.0 82.3  PLT 190 161  Basic Metabolic Panel:  Recent Labs Lab 07/24/16 1903 07/25/16 0832  NA 140 140  K 4.2 3.9  CL 109 110  CO2 22 24  GLUCOSE 114* 107*  BUN 29* 19  CREATININE 1.34* 0.95  CALCIUM 9.1 8.5*   Liver Function Tests:  Recent Labs Lab  07/24/16 1903  AST 22  ALT 10*  ALKPHOS 76  BILITOT 0.2*  PROT 6.4*  ALBUMIN 3.5   CBG:  Recent Labs Lab 07/25/16 0939  GLUCAP 103*    No results found for this or any previous visit (from the past 240 hour(s)).       Radiology Studies: Dg Chest 2 View  Result Date: 07/24/2016 CLINICAL DATA:  Chest pain. Shortness of breath and chest tightness. EXAM: CHEST  2 VIEW COMPARISON:  Radiograph 07/13/2016 FINDINGS: Stable heart size and mediastinal contours with large retrocardiac hiatal hernia. Atherosclerosis noted of the aortic arch. Cardiac stent is visualized. There is no pulmonary edema, pleural effusion, focal airspace disease or pneumothorax. Minimal biapical pleuroparenchymal scarring. No acute osseous abnormalities. IMPRESSION: 1. No acute abnormality.  Coronary stent has been placed. 2. Thoracic aortic atherosclerosis. 3. Large hiatal hernia, unchanged in radiographic appearance. Electronically Signed   By: Jeb Levering M.D.   On: 07/24/2016 22:33        Scheduled Meds: . aspirin EC  81 mg Oral Daily  . atorvastatin  80 mg Oral q1800  . [START ON 07/26/2016] clopidogrel  75 mg Oral Daily  . fluticasone furoate-vilanterol  1 puff Inhalation Daily  . levothyroxine  137 mcg Oral QAC breakfast  . metoprolol tartrate  12.5 mg Oral BID  . pantoprazole  40 mg Oral QAC breakfast   Continuous Infusions: . sodium chloride 75 mL/hr at 07/25/16 1204     LOS: 1 day     Toddy Boyd, MD, FACP, FHM. [facility] Pager [phone]  If 7PM-7AM, please contact night-coverage [email or web address] Password [facility] 07/25/2016, 4:13 PM

## 2016-07-25 NOTE — Brief Op Note (Signed)
07/24/2016 - 07/25/2016  11:24 AM  PATIENT:  Sonia [name]  76 y.o. female  PRE-OPERATIVE DIAGNOSIS:  Melena,anemia, recent MI on ASA and Brilinta  POST-OPERATIVE DIAGNOSIS:  gastric erosions,large hiatal hernia, duodenal bulb ulcer  PROCEDURE:  Procedure(s): ESOPHAGOGASTRODUODENOSCOPY (EGD) WITH PROPOFOL (Left)  SURGEON:  Surgeon(s) and Role:    Ronnette Juniper, MD - Primary  PHYSICIAN ASSISTANT:   ASSISTANTS: none   ANESTHESIA:   MAC  EBL:  Total I/O In: 200 [I.V.:200] Out: -   BLOOD ADMINISTERED:none  DRAINS: none   LOCAL MEDICATIONS USED:  NONE  SPECIMEN:  No Specimen  DISPOSITION OF SPECIMEN:  N/A  COUNTS:  YES  TOURNIQUET:  * No tourniquets in log *  DICTATION: .Dragon Dictation  PLAN OF CARE: Admit to inpatient   PATIENT DISPOSITION:  PACU - hemodynamically stable.   Delay start of Pharmacological VTE agent (>24hrs) due to surgical blood loss or risk of bleeding: no

## 2016-07-25 NOTE — Op Note (Signed)
[facility] Patient Name: Sonia [name] Procedure Date : 07/25/2016 MRN: [id number] Attending MD: Ronnette Juniper , MD Date of Birth: 1940/07/24 CSN: [id number] Age: 76 Admit Type: Outpatient Procedure:                Upper GI endoscopy Indications:              Acute post hemorrhagic anemia, Melena Providers:                Ronnette Juniper, MD, Burtis Junes, RN, Lillie Fragmin, RN,                            Corliss Parish, Technician, Marcene Duos,                            Technician Referring MD:              Medicines:                Monitored Anesthesia Care Complications:            No immediate complications. Estimated Blood Loss:     Estimated blood loss: none. Procedure:                Pre-Anesthesia Assessment:                           - Prior to the procedure, a History and Physical                            was performed, and patient medications and                            allergies were reviewed. The patient's tolerance of                            previous anesthesia was also reviewed. The risks                            and benefits of the procedure and the sedation                            options and risks were discussed with the patient.                            All questions were answered, and informed consent                            was obtained. Prior Anticoagulants: The patient has                            taken brilinta, last dose was day of procedure. ASA                            Grade Assessment: II - A patient with mild systemic  disease. After reviewing the risks and benefits,                            the patient was deemed in satisfactory condition to                            undergo the procedure.                           After obtaining informed consent, the endoscope was                            passed under direct vision. Throughout the                            procedure, the patient's blood  pressure, pulse, and                            oxygen saturations were monitored continuously. The                            Endoscope was introduced through the mouth, and                            advanced to the second part of duodenum. The upper                            GI endoscopy was accomplished without difficulty.                            The patient tolerated the procedure well. Scope In: Scope Out: Findings:      The upper third of the esophagus and middle third of the esophagus were       normal.      The Z-line was regular and was found 35 cm from the incisors.      A 10 cm hiatal hernia was present.      A few dispersed, 8 mm non-bleeding erosions were found in the cardia and       in the gastric fundus. There were no stigmata of recent bleeding.      A few localized, 6 mm non-bleeding erosions were found in the gastric       antrum. There were no stigmata of recent bleeding.      Two non-bleeding superficial duodenal ulcers with a clean ulcer base       (Forrest Class III) were found in the duodenal bulb. The largest lesion       was 3 mm in largest dimension.      Retroflexion revealed a large hiatal hernia.      Biopsy is contraindicated because the patient is taking anticoagulation       medication. Impression:               - Normal upper third of esophagus and middle third                            of esophagus.                           -  Z-line regular, 35 cm from the incisors.                           - 10 cm hiatal hernia.                           - Non-bleeding erosive gastropathy.                           - Non-bleeding erosive gastropathy.                           - Multiple non-bleeding duodenal ulcers with a                            clean ulcer base (Forrest Class III).                           - No specimens collected. Moderate Sedation:      Patient did not receive moderate sedation for this procedure, but       instead received  monitored anesthesia care. Recommendation:           - Cardiac diet today.                           - Continue present medications.                           - Use Protonix (pantoprazole) 40 mg PO daily for 8                            weeks.                           - Avoid late night meals and space last meal of the                            day and bedtime by at least 3 hours, weight loss                            and elevate head end of bed during sleep.                           - Return patient to hospital ward for ongoing care. Procedure Code(s):        --- Professional ---                           [phone], Esophagogastroduodenoscopy, flexible,                            transoral; diagnostic, including collection of                            specimen(s) by brushing or washing, when performed                            (  separate procedure) Diagnosis Code(s):        --- Professional ---                           K44.9, Diaphragmatic hernia without obstruction or                            gangrene                           K31.89, Other diseases of stomach and duodenum                           K26.9, Duodenal ulcer, unspecified as acute or                            chronic, without hemorrhage or perforation                           D62, Acute posthemorrhagic anemia                           K92.1, Melena (includes Hematochezia) CPT copyright 2016 American [employer] Association. All rights reserved. The codes documented in this report are preliminary and upon coder review may  be revised to meet current compliance requirements. Ronnette Juniper, MD 07/25/2016 11:22:52 AM This report has been signed electronically. Number of Addenda: 0

## 2016-07-26 ENCOUNTER — Ambulatory Visit: Payer: Medicare Other | Admitting: Cardiology

## 2016-07-26 LAB — CBC
HEMATOCRIT: 34.8 % — AB (ref 36.0–46.0)
HEMOGLOBIN: 10.9 g/dL — AB (ref 12.0–15.0)
MCH: 26.1 pg (ref 26.0–34.0)
MCHC: 31.3 g/dL (ref 30.0–36.0)
MCV: 83.5 fL (ref 78.0–100.0)
PLATELETS: 165 10*3/uL (ref 150–400)
RBC: 4.17 MIL/uL (ref 3.87–5.11)
RDW: 16.1 % — ABNORMAL HIGH (ref 11.5–15.5)
WBC: 8.3 10*3/uL (ref 4.0–10.5)

## 2016-07-26 MED ORDER — ALBUTEROL SULFATE (2.5 MG/3ML) 0.083% IN NEBU: 2.5000 mg | INHALATION_SOLUTION | Freq: Four times a day (QID) | RESPIRATORY_TRACT | 0 refills | Status: DC | PRN

## 2016-07-26 MED ORDER — CLOPIDOGREL BISULFATE 75 MG PO TABS: 75.0000 mg | ORAL_TABLET | Freq: Every day | ORAL | 1 refills | Status: DC

## 2016-07-26 NOTE — Discharge Instructions (Signed)

## 2016-07-26 NOTE — Progress Notes (Signed)
NURSING PROGRESS NOTE  Sonia [name] [id number] Discharge Data: 07/26/2016 12:19 PM Attending Provider: No att. providers found [id number], [name], MD     Roselee Nova to be D/C'd Home per MD order.  Discussed with the patient the After Visit Summary and all questions fully answered. All IV's discontinued with no bleeding noted. All belongings returned to patient for patient to take home.   Last Vital Signs:  Blood pressure (!) 120/54, pulse 76, temperature 98.1 F (36.7 C), temperature source Oral, resp. rate 18, height 5' 4.75" (1.645 m), weight 67.6 kg (149 lb), SpO2 99 %.  Discharge Medication List Allergies as of 07/26/2016      Reactions   Adenosine Shortness Of Breath   Contrast Media [iodinated Diagnostic Agents] Anaphylaxis   Codeine Sulfate Nausea And Vomiting   Latex Other (See Comments)   Skin irritation   Adhesive [tape] Other (See Comments)   SKIN IRRITATION      Medication List    STOP taking these medications   ticagrelor 90 MG Tabs tablet Commonly known as:  BRILINTA     TAKE these medications   acetaminophen 325 MG tablet Commonly known as:  TYLENOL Take 2 tablets (650 mg total) by mouth every 6 (six) hours as needed for mild pain, fever or headache.   albuterol 108 (90 Base) MCG/ACT inhaler Commonly known as:  PROAIR HFA Inhale 2 puffs into the lungs every 6 (six) hours as needed for wheezing or shortness of breath.   albuterol (2.5 MG/3ML) 0.083% nebulizer solution Commonly known as:  PROVENTIL Take 3 mLs (2.5 mg total) by nebulization every 6 (six) hours as needed for wheezing or shortness of breath.   aspirin 81 MG EC tablet Take 1 tablet (81 mg total) by mouth daily.   atorvastatin 80 MG tablet Commonly known as:  LIPITOR Take 1 tablet (80 mg total) by mouth daily at 6 PM.   budesonide-formoterol 160-4.5 MCG/ACT inhaler Commonly known as:  SYMBICORT Inhale 2 puffs into the lungs 2 (two) times daily.   calcium carbonate 750 MG chewable  tablet Commonly known as:  TUMS EX Chew 1 tablet by mouth at bedtime as needed for heartburn (acid indigestion).   clopidogrel 75 MG tablet Commonly known as:  PLAVIX Take 1 tablet (75 mg total) by mouth daily. Start taking on:  07/27/2016   levothyroxine 137 MCG tablet Commonly known as:  SYNTHROID, LEVOTHROID Take 137 mcg by mouth daily before breakfast.   lisinopril 2.5 MG tablet Commonly known as:  PRINIVIL,ZESTRIL Take 1 tablet (2.5 mg total) by mouth daily.   metoprolol tartrate 25 MG tablet Commonly known as:  LOPRESSOR Take 0.5 tablets (12.5 mg total) by mouth 2 (two) times daily.   nitroGLYCERIN 0.4 MG SL tablet Commonly known as:  NITROSTAT Place 1 tablet (0.4 mg total) under the tongue every 5 (five) minutes as needed for chest pain (CP or SOB).   pantoprazole 40 MG tablet Commonly known as:  PROTONIX Take 1 tablet (40 mg total) by mouth daily before breakfast.        Doristine Devoid, RN

## 2016-07-26 NOTE — Anesthesia Postprocedure Evaluation (Signed)
Anesthesia Post Note  Patient: Roselee Nova  Procedure(s) Performed: Procedure(s) (LRB): ESOPHAGOGASTRODUODENOSCOPY (EGD) WITH PROPOFOL (Left)  Patient location during evaluation: PACU Anesthesia Type: MAC Level of consciousness: awake and alert Pain management: pain level controlled Vital Signs Assessment: post-procedure vital signs reviewed and stable Respiratory status: spontaneous breathing, nonlabored ventilation, respiratory function stable and patient connected to nasal cannula oxygen Cardiovascular status: stable and blood pressure returned to baseline Anesthetic complications: no       Last Vitals:  Vitals:   07/25/16 2323 07/26/16 0512  BP: (!) 125/53 (!) 120/54  Pulse: 72 76  Resp:  18  Temp:  36.7 C    Last Pain:  Vitals:   07/26/16 0512  TempSrc: Oral  PainSc:                  Riccardo Dubin

## 2016-07-26 NOTE — Discharge Summary (Signed)
Physician Discharge Summary  Sonia [name] IBB:[id number] DOB: 19-Jan-1941  PCP: [name], MD  Admit date: 07/24/2016 Discharge date: 07/26/2016  Recommendations for Outpatient Follow-up:  1. Dr. [name], PCP in one week with repeat labs (CBC & BMP). 2. Kerin Ransom, PA-C/Cardiology in 1 week. Patient missed an appointment that she had for this afternoon but was advised by the cardiologist that it was okay to reschedule. 3. Dr. Ronnette Juniper, [facility] in 4 weeks.  Home Health: None Equipment/Devices: None    Discharge Condition: Improved and stable  CODE STATUS: Full  Diet recommendation: Heart healthy diet.  Discharge Diagnoses:  Principal Problem:   Melena Active Problems:   CAD S/P percutaneous coronary angioplasty   Acute renal insufficiency   Brief Summary: 76 year old female with PMH of CAD, asthma/COPD, GERD, hypothyroid, hiatal hernia, recent hospitalization 07/13/16-07/16/16 for NSTEMI, cardiology was consulted and underwent PCI with DES to right coronary artery and was discharged on ASA and Brillinta, had scheduled follow-up with cardiology on 07/26/16, now presented with 2 days history of melena without abdominal pain, nausea or vomiting. She remained hemodynamically stable and hemoglobin stable.   Assessment & Plan:   1. Acute upper GI bleed: In the context of recent ASA & Brillinta for CAD s/p PCI. [facility] consulted. Status post EGD 5/16 which showed a large hiatal hernia, erosions in the gastric fundus and antrum and a diminutive duodenal bulb ulcer. GI recommended continue home dose PPI for next 8 weeks (patient was not fully compliant prior to admission but was counseled regarding compliance). She was monitored overnight and did not have any further BMs in the hospital. Hemoglobin remained stable. Antiplatelets discussed with cardiology and changes made as below.  2. CAD status post PCI/DES to RCA 07/15/16: Discussed with Dr. Burt Knack, Cardiology and changed Brillinta  to Plavix and continue aspirin. Continue statins and beta blockers. Outpatient follow-up with cardiology. As per RN report, at the time of discharge, patient had brief "7 beat PVCs". Reviewed telemetry which did not show a run of PVCs. It showed sinus rhythm with occasional PVCs. Advised reassurance. 3. Anemia: Stable. 4. Acute kidney injury: Resolved. Lisinopril resumed at discharge. Recommend repeating BMP in a couple of days. 5. GERD/large hiatal hernia:  as stated above, continue Protonix. 6. Asthma/COPD: Stable without bronchospasm. 7. Hypothyroid: Continue Synthroid.    Consultants:  [facility] Discussed with Dr. Sherren Mocha, Cardiology   Procedures:  EGD 5/16   Discharge Instructions  Discharge Instructions    Call MD for:    Complete by:  As directed    Persistent black tarry stools, recurrence of blacks tarry stools or vomiting blood or coffee-ground material.   Call MD for:  difficulty breathing, headache or visual disturbances    Complete by:  As directed    Call MD for:  extreme fatigue    Complete by:  As directed    Call MD for:  persistant dizziness or light-headedness    Complete by:  As directed    Diet - low sodium heart healthy    Complete by:  As directed    Increase activity slowly    Complete by:  As directed        Medication List    STOP taking these medications   ticagrelor 90 MG Tabs tablet Commonly known as:  BRILINTA     TAKE these medications   acetaminophen 325 MG tablet Commonly known as:  TYLENOL Take 2 tablets (650 mg total) by mouth every 6 (six) hours  as needed for mild pain, fever or headache.   albuterol 108 (90 Base) MCG/ACT inhaler Commonly known as:  PROAIR HFA Inhale 2 puffs into the lungs every 6 (six) hours as needed for wheezing or shortness of breath.   albuterol (2.5 MG/3ML) 0.083% nebulizer solution Commonly known as:  PROVENTIL Take 3 mLs (2.5 mg total) by nebulization every 6 (six) hours as needed for wheezing  or shortness of breath.   aspirin 81 MG EC tablet Take 1 tablet (81 mg total) by mouth daily.   atorvastatin 80 MG tablet Commonly known as:  LIPITOR Take 1 tablet (80 mg total) by mouth daily at 6 PM.   budesonide-formoterol 160-4.5 MCG/ACT inhaler Commonly known as:  SYMBICORT Inhale 2 puffs into the lungs 2 (two) times daily.   calcium carbonate 750 MG chewable tablet Commonly known as:  TUMS EX Chew 1 tablet by mouth at bedtime as needed for heartburn (acid indigestion).   clopidogrel 75 MG tablet Commonly known as:  PLAVIX Take 1 tablet (75 mg total) by mouth daily. Start taking on:  07/27/2016   levothyroxine 137 MCG tablet Commonly known as:  SYNTHROID, LEVOTHROID Take 137 mcg by mouth daily before breakfast.   lisinopril 2.5 MG tablet Commonly known as:  PRINIVIL,ZESTRIL Take 1 tablet (2.5 mg total) by mouth daily.   metoprolol tartrate 25 MG tablet Commonly known as:  LOPRESSOR Take 0.5 tablets (12.5 mg total) by mouth 2 (two) times daily.   nitroGLYCERIN 0.4 MG SL tablet Commonly known as:  NITROSTAT Place 1 tablet (0.4 mg total) under the tongue every 5 (five) minutes as needed for chest pain (CP or SOB).   pantoprazole 40 MG tablet Commonly known as:  PROTONIX Take 1 tablet (40 mg total) by mouth daily before breakfast.      Follow-up Information    [name], MD. Schedule an appointment as soon as possible for a visit in 1 week(s).   Specialty:  Internal Medicine Why:  To be seen with repeat labs (CBC & BMP). Contact information: [address] [place] [address] [phone]        Ronnette Juniper, MD. Schedule an appointment as soon as possible for a visit in 4 week(s).   Specialty:  Gastroenterology Contact information: [place] [address] [phone]        Erlene Quan, PA-C. Schedule an appointment as soon as possible for a visit in 1 week(s).   Specialties:  Cardiology,  Radiology Contact information: [address] [place] [address] [phone]          Allergies  Allergen Reactions  . Adenosine Shortness Of Breath  . Contrast Media [Iodinated Diagnostic Agents] Anaphylaxis  . Codeine Sulfate Nausea And Vomiting  . Latex Other (See Comments)    Skin irritation  . Adhesive [Tape] Other (See Comments)    SKIN IRRITATION      Procedures/Studies: Dg Chest 2 View  Result Date: 07/24/2016 CLINICAL DATA:  Chest pain. Shortness of breath and chest tightness. EXAM: CHEST  2 VIEW COMPARISON:  Radiograph 07/13/2016 FINDINGS: Stable heart size and mediastinal contours with large retrocardiac hiatal hernia. Atherosclerosis noted of the aortic arch. Cardiac stent is visualized. There is no pulmonary edema, pleural effusion, focal airspace disease or pneumothorax. Minimal biapical pleuroparenchymal scarring. No acute osseous abnormalities. IMPRESSION: 1. No acute abnormality.  Coronary stent has been placed. 2. Thoracic aortic atherosclerosis. 3. Large hiatal hernia, unchanged in radiographic appearance. Electronically Signed  By: Jeb Levering M.D.   On: 07/24/2016 22:33     Subjective: Seen this morning. No BM since hospital admission. No pain reported. Denies dizziness or lightheadedness.  Discharge Exam:  Vitals:   07/25/16 2139 07/25/16 2201 07/25/16 2323 07/26/16 0512  BP: (!) 134/50 (!) 120/56 (!) 125/53 (!) 120/54  Pulse: 88 86 72 76  Resp: 18   18  Temp: 98.4 F (36.9 C)   98.1 F (36.7 C)  TempSrc: Oral   Oral  SpO2: 99%   99%  Weight:      Height:        General exam: Pleasant elderly female sitting up comfortably in bed. Respiratory system: Clear to auscultation without wheezing, rhonchi or crackles. No increased work of breathing. Cardiovascular system: S1 & S2 heard, RRR. No JVD, murmurs, rubs, gallops or clicks. No pedal edema. Telemetry: Sinus rhythm with occasional PVCs Gastrointestinal system: Abdomen is  nondistended, soft and nontender. No organomegaly or masses felt. Normal bowel sounds heard. Central nervous system: Alert and oriented. No focal neurological deficits. Extremities: Symmetric 5 x 5 power. Skin: No rashes, lesions or ulcers Psychiatry: Judgement and insight appear normal. Mood & affect appropriate.     The results of significant diagnostics from this hospitalization (including imaging, microbiology, ancillary and laboratory) are listed below for reference.     Microbiology: No results found for this or any previous visit (from the past 240 hour(s)).   Labs: CBC:  Recent Labs Lab 07/24/16 1903 07/25/16 0832 07/26/16 0500  WBC 9.5 7.1 8.3  HGB 11.6* 10.7* 10.9*  HCT 36.6 33.5* 34.8*  MCV 83.0 82.3 83.5  PLT 190 161 416   Basic Metabolic Panel:  Recent Labs Lab 07/24/16 1903 07/25/16 0832  NA 140 140  K 4.2 3.9  CL 109 110  CO2 22 24  GLUCOSE 114* 107*  BUN 29* 19  CREATININE 1.34* 0.95  CALCIUM 9.1 8.5*   Liver Function Tests:  Recent Labs Lab 07/24/16 1903  AST 22  ALT 10*  ALKPHOS 76  BILITOT 0.2*  PROT 6.4*  ALBUMIN 3.5   BNP (last 3 results)  CBG:  Recent Labs Lab 07/25/16 0939  GLUCAP 103*      Time coordinating discharge: Over 30 minutes  SIGNED:  Vernell Leep, MD, FACP, FHM. [facility] Pager [phone]  If 7PM-7AM, please contact night-coverage [email or web address] Password [facility] 07/26/2016, 9:17 AM

## 2016-08-01 ENCOUNTER — Telehealth: Payer: Self-pay | Admitting: Cardiology

## 2016-08-02 NOTE — Telephone Encounter (Signed)
Close encounter 

## 2016-08-10 DIAGNOSIS — K269 Duodenal ulcer, unspecified as acute or chronic, without hemorrhage or perforation: Secondary | ICD-10-CM | POA: Diagnosis not present

## 2016-08-10 DIAGNOSIS — I2510 Atherosclerotic heart disease of native coronary artery without angina pectoris: Secondary | ICD-10-CM | POA: Diagnosis not present

## 2016-08-10 DIAGNOSIS — K922 Gastrointestinal hemorrhage, unspecified: Secondary | ICD-10-CM | POA: Diagnosis not present

## 2016-08-10 DIAGNOSIS — R5383 Other fatigue: Secondary | ICD-10-CM | POA: Diagnosis not present

## 2016-08-14 ENCOUNTER — Encounter: Payer: Self-pay | Admitting: Cardiology

## 2016-08-14 ENCOUNTER — Ambulatory Visit (INDEPENDENT_AMBULATORY_CARE_PROVIDER_SITE_OTHER): Payer: Medicare Other | Admitting: Cardiology

## 2016-08-14 DIAGNOSIS — K2901 Acute gastritis with bleeding: Secondary | ICD-10-CM

## 2016-08-14 DIAGNOSIS — E785 Hyperlipidemia, unspecified: Secondary | ICD-10-CM

## 2016-08-14 DIAGNOSIS — Z9861 Coronary angioplasty status: Secondary | ICD-10-CM | POA: Diagnosis not present

## 2016-08-14 DIAGNOSIS — I2510 Atherosclerotic heart disease of native coronary artery without angina pectoris: Secondary | ICD-10-CM

## 2016-08-14 DIAGNOSIS — I214 Non-ST elevation (NSTEMI) myocardial infarction: Secondary | ICD-10-CM | POA: Diagnosis not present

## 2016-08-14 DIAGNOSIS — J449 Chronic obstructive pulmonary disease, unspecified: Secondary | ICD-10-CM | POA: Diagnosis not present

## 2016-08-14 MED ORDER — PANTOPRAZOLE SODIUM 40 MG PO TBEC: 40.0000 mg | DELAYED_RELEASE_TABLET | Freq: Every day | ORAL | 4 refills | Status: DC

## 2016-08-14 NOTE — Assessment & Plan Note (Signed)
NSTEMI 07/13/16- peak Troponin 0.47

## 2016-08-14 NOTE — Patient Instructions (Addendum)
Medication Instructions:  STOP LISINOPRIL  START PROTONIX 40 MG DAILY   STOP OMEPRAZOLE   Labwork: none  Testing/Procedures: none  Follow-Up: Your physician recommends that you schedule a follow-up appointment with Dr Angelena Form  in 6-8 weeks  If you need a refill on your cardiac medications before your next appointment, please call your pharmacy.

## 2016-08-14 NOTE — Progress Notes (Signed)
08/14/2016 Sonia [name]   Apr 23, 1940  [id number]  Primary Physician [name], MD Primary Cardiologist: Dr Erenest Rasher (new)  HPI:  76 y/o female admitted 07/13/16 with NSTEMI. While being watched over the weekend she had recurrent chest pain and was taken to the lab Sunday 07/15/16. She had an RCA DES placed. EF was preserved. She had 50% residual LAD. She was discharged on Brilinta then admitted 07/25/16 with GI bleeding. Endoscopy showed gastritis. She was changed from Brilinta to Plavix.  Her Hgb was stable and she did not require transfusion.  She is in the office today for follow up. She has been doing well overall, no further bleeding. She is "not happy about taking Lipitor" but I explained the reasons for this. She has some fatigue and low B/P at times .She though this was from Lopressor but she is also on lisinopril.     Current Outpatient Prescriptions  Medication Sig Dispense Refill  . acetaminophen (TYLENOL) 325 MG tablet Take 2 tablets (650 mg total) by mouth every 6 (six) hours as needed for mild pain, fever or headache.    . albuterol (PROAIR HFA) 108 (90 Base) MCG/ACT inhaler Inhale 2 puffs into the lungs every 6 (six) hours as needed for wheezing or shortness of breath.    Marland Kitchen albuterol (PROVENTIL) (2.5 MG/3ML) 0.083% nebulizer solution Take 3 mLs (2.5 mg total) by nebulization every 6 (six) hours as needed for wheezing or shortness of breath. 75 mL 0  . aspirin EC 81 MG EC tablet Take 1 tablet (81 mg total) by mouth daily.    Marland Kitchen atorvastatin (LIPITOR) 80 MG tablet Take 1 tablet (80 mg total) by mouth daily at 6 PM. 90 tablet 3  . budesonide-formoterol (SYMBICORT) 160-4.5 MCG/ACT inhaler Inhale 2 puffs into the lungs 2 (two) times daily. 3 Inhaler 3  . calcium carbonate (TUMS EX) 750 MG chewable tablet Chew 1 tablet by mouth at bedtime as needed for heartburn (acid indigestion).    . clopidogrel (PLAVIX) 75 MG tablet Take 1 tablet (75 mg total) by mouth daily. 30 tablet 1  .  levothyroxine (SYNTHROID, LEVOTHROID) 137 MCG tablet Take 137 mcg by mouth daily before breakfast.    . lisinopril (PRINIVIL,ZESTRIL) 2.5 MG tablet Take 1 tablet (2.5 mg total) by mouth daily. 90 tablet 2  . metoprolol tartrate (LOPRESSOR) 25 MG tablet Take 0.5 tablets (12.5 mg total) by mouth 2 (two) times daily. 90 tablet 3  . nitroGLYCERIN (NITROSTAT) 0.4 MG SL tablet Place 1 tablet (0.4 mg total) under the tongue every 5 (five) minutes as needed for chest pain (CP or SOB). 25 tablet 2  . pantoprazole (PROTONIX) 40 MG tablet Take 1 tablet (40 mg total) by mouth daily before breakfast. 30 tablet 4   No current facility-administered medications for this visit.     Allergies  Allergen Reactions  . Adenosine Shortness Of Breath  . Contrast Media [Iodinated Diagnostic Agents] Anaphylaxis  . Codeine Sulfate Nausea And Vomiting  . Latex Other (See Comments)    Skin irritation  . Adhesive [Tape] Other (See Comments)    SKIN IRRITATION    Past Medical History:  Diagnosis Date  . Arthritis   . Asthma    daily and prn inhalers; states good control currently  . CAD S/P percutaneous coronary angioplasty 07/15/2016  . COPD (chronic obstructive pulmonary disease) ([place])   . GERD (gastroesophageal reflux disease)    no current med.  . GI bleed 07/25/2016   Brilinta changed to Plavix  .  Hashimoto's thyroiditis   . Headache    allergies; silent migraines  . Hiatal hernia   . Hypothyroidism   . Pneumonia   . 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 Vitamin D deficiency     Social History   Social History  . Marital status: Divorced    Spouse name: N/A  . Number of children: 2  . Years of education: N/A   Occupational History  . 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 Smoking status: Never Smoker  . Smokeless tobacco: Never Used  . Alcohol use Yes     Comment: rarely  . Drug use: No  . Sexual activity: Not on file   Other  Topics Concern  . Not on file   Social History Narrative  . No narrative on file     Family History  Problem Relation Age of Onset  . Asthma Mother   . Heart disease Mother   . Asthma Sister   . Heart disease Sister   . Breast cancer Sister 78  . Heart disease Father   . Kidney disease Father   . Prostate cancer Brother        dx. 48s  . Heart attack Brother 14  . Alzheimer's disease Brother 64  . Breast cancer Maternal Aunt 70  . Stomach cancer Maternal Grandfather 60  . Asthma Sister   . Memory loss Sister   . Heart attack Son 57  . Spinal muscular atrophy Grandchild        type II  . Cancer Other 81       niece dx. ca of salivary gland   . Breast cancer Other 58       niece; negative genetic testing 10 years ago  . Melanoma Other   . Alzheimer's disease Maternal Aunt   . Cervical cancer Cousin        maternal 1st cousin dx. at 32  . Breast cancer Cousin 25       maternal 1st cousin  . Lung cancer Cousin 37       maternal 1st cousin; smoker  . Heart disease Cousin   . Leukemia Cousin 14       paternal 1st cousin  . Breast cancer Other        paternal great grandmother (PGF's mother) dx. late 13s-early 9s  . Coronary artery disease Unknown      Review of Systems: General: negative for chills, fever, night sweats or weight changes.  Cardiovascular: negative for chest pain, dyspnea on exertion, edema, orthopnea, palpitations, paroxysmal nocturnal dyspnea or shortness of breath Dermatological: negative for rash Respiratory: negative for cough or wheezing Urologic: negative for hematuria Abdominal: negative for nausea, vomiting, diarrhea, bright red blood per rectum, melena, or hematemesis Neurologic: negative for visual changes, syncope, or dizziness All other systems reviewed and are otherwise negative except as noted above.    Blood pressure 101/71, pulse 90, height 5' 4.5" (1.638 m), weight 153 lb 9.6 oz (69.7 kg).  General appearance: alert, cooperative  and no distress Neck: no carotid bruit and no JVD Lungs: clear to auscultation bilaterally Heart: regular rate and rhythm Extremities: no edema Skin: Skin color, texture, turgor normal. No rashes or lesions Neurologic: Grossly normal  EKG NSR  ASSESSMENT AND PLAN:   NSTEMI (non-ST elevated myocardial infarction) ([place]) NSTEMI 07/13/16- peak Troponin 0.47  CAD S/P percutaneous coronary angioplasty Cath 07/15/16- 90% mRCA-treated with DES.  Residual 50% mLAD. 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 She also told me she was taking Prilosec not Protonix and I encouraged her to take the Protonix instead. She has no cardiologist- Dr Harl Bowie admitted her and Dr Burt Knack did her PCI. She requested Dr Julianne Handler and I will arrange that.   Kerin Ransom PA-C 08/14/2016 2:13 PM

## 2016-08-14 NOTE — Assessment & Plan Note (Signed)
Cath 07/15/16- 90% mRCA-treated with DES. Residual 50% mLAD. EF 55-60%

## 2016-08-14 NOTE — Assessment & Plan Note (Signed)
Brilinta changed to Plavix

## 2016-08-14 NOTE — Assessment & Plan Note (Signed)
Stable

## 2016-08-14 NOTE — Assessment & Plan Note (Signed)
LDL 139 07/13/16- high dose statin added

## 2016-08-16 ENCOUNTER — Telehealth (HOSPITAL_COMMUNITY): Payer: Self-pay

## 2016-08-16 NOTE — Telephone Encounter (Signed)
I called and spoke to patient about scheduling for cardiac rehab. Patient stated that she is not ready at this time and would like for me to contact her in August. I asked patient if I could contact her the first week of August and she stated that would be fine. I will defer referral till August and contact patient then.

## 2016-09-21 NOTE — Anesthesia Postprocedure Evaluation (Signed)
Anesthesia Post Note  Patient: Roselee Nova  Procedure(s) Performed: Procedure(s) (LRB): ESOPHAGOGASTRODUODENOSCOPY (EGD) WITH PROPOFOL (Left)     Anesthesia Post Evaluation  Last Vitals:  Vitals:   07/25/16 2323 07/26/16 0512  BP: (!) 125/53 (!) 120/54  Pulse: 72 76  Resp:  18  Temp:  36.7 C    Last Pain:  Vitals:   07/26/16 0725  TempSrc:   PainSc: 0-No pain                 Amaurie Schreckengost EDWARD

## 2016-09-21 NOTE — Addendum Note (Signed)
Addendum  created 09/21/16 1253 by Lyndle Herrlich, MD   Sign clinical note

## 2016-09-21 NOTE — Anesthesia Postprocedure Evaluation (Signed)
Anesthesia Post Note  Patient: Sonia [name]  Procedure(s) Performed: Procedure(s) (LRB): ESOPHAGOGASTRODUODENOSCOPY (EGD) WITH PROPOFOL (Left)     Anesthesia Post Evaluation  Last Vitals:  Vitals:   07/25/16 2323 07/26/16 0512  BP: (!) 125/53 (!) 120/54  Pulse: 72 76  Resp:  18  Temp:  36.7 C    Last Pain:  Vitals:   07/26/16 0725  TempSrc:   PainSc: 0-No pain                 Christabel Camire EDWARD

## 2016-10-10 DIAGNOSIS — J449 Chronic obstructive pulmonary disease, unspecified: Secondary | ICD-10-CM | POA: Diagnosis not present

## 2016-10-10 DIAGNOSIS — I2510 Atherosclerotic heart disease of native coronary artery without angina pectoris: Secondary | ICD-10-CM | POA: Diagnosis not present

## 2016-10-11 ENCOUNTER — Ambulatory Visit (INDEPENDENT_AMBULATORY_CARE_PROVIDER_SITE_OTHER): Payer: Medicare Other | Admitting: Pulmonary Disease

## 2016-10-11 ENCOUNTER — Encounter: Payer: Self-pay | Admitting: Pulmonary Disease

## 2016-10-11 VITALS — BP 108/72 | HR 82 | Ht 64.0 in | Wt 154.2 lb

## 2016-10-11 DIAGNOSIS — J449 Chronic obstructive pulmonary disease, unspecified: Secondary | ICD-10-CM

## 2016-10-11 DIAGNOSIS — I2510 Atherosclerotic heart disease of native coronary artery without angina pectoris: Secondary | ICD-10-CM | POA: Diagnosis not present

## 2016-10-11 DIAGNOSIS — Z9861 Coronary angioplasty status: Secondary | ICD-10-CM

## 2016-10-11 MED ORDER — FLUTICASONE-UMECLIDIN-VILANT 100-62.5-25 MCG/INH IN AEPB: 1.0000 | INHALATION_SPRAY | Freq: Every day | RESPIRATORY_TRACT | 0 refills | Status: DC

## 2016-10-11 MED ORDER — BUDESONIDE-FORMOTEROL FUMARATE 160-4.5 MCG/ACT IN AERO: 2.0000 | INHALATION_SPRAY | Freq: Two times a day (BID) | RESPIRATORY_TRACT | 0 refills | Status: DC

## 2016-10-11 NOTE — Progress Notes (Signed)
Current Outpatient Prescriptions on File Prior to Visit  Medication Sig  . acetaminophen (TYLENOL) 325 MG tablet Take 2 tablets (650 mg total) by mouth every 6 (six) hours as needed for mild pain, fever or headache.  . albuterol (PROAIR HFA) 108 (90 Base) MCG/ACT inhaler Inhale 2 puffs into the lungs every 6 (six) hours as needed for wheezing or shortness of breath.  Marland Kitchen albuterol (PROVENTIL) (2.5 MG/3ML) 0.083% nebulizer solution Take 3 mLs (2.5 mg total) by nebulization every 6 (six) hours as needed for wheezing or shortness of breath.  Marland Kitchen aspirin EC 81 MG EC tablet Take 1 tablet (81 mg total) by mouth daily.  . budesonide-formoterol (SYMBICORT) 160-4.5 MCG/ACT inhaler Inhale 2 puffs into the lungs 2 (two) times daily.  . clopidogrel (PLAVIX) 75 MG tablet Take 1 tablet (75 mg total) by mouth daily.  Marland Kitchen levothyroxine (SYNTHROID, LEVOTHROID) 137 MCG tablet Take 137 mcg by mouth daily before breakfast.  . nitroGLYCERIN (NITROSTAT) 0.4 MG SL tablet Place 1 tablet (0.4 mg total) under the tongue every 5 (five) minutes as needed for chest pain (CP or SOB).  . pantoprazole (PROTONIX) 40 MG tablet Take 1 tablet (40 mg total) by mouth daily before breakfast.   No current facility-administered medications on file prior to visit.     Chief Complaint  Patient presents with  . Follow-up    Pt states that her breathing is not doing well with the current weather. C/o SOB and chest tightness/heaviness    Pulmonary tests PFT 04/15/08 >> FEV1 1.63 (76%), FEV1% 47, TLC 5.68(113%), DLCO 87%, no BD PFT 07/30/11 >> FEV1 1.25 (61%), FEV1% 47, TLC 4.79 (96%), DLCO 94%, no BD  Cardiac tests Echo 07/16/16 >> EF 60 to 65%, grade 2 DD, mild MR, mild TR, PAS 37 mmHg  Past medical history CAD s/p DES, HLD, Vertigo, GERD, HH, Melanoma, Hypothyroidism  Past surgical history, Family history, Social history, Allergies reviewed  Vital signs BP 108/72 (BP Location: Left Arm, Cuff Size: Normal)   Pulse 82   Ht 5\' 4"   (1.626 m)   Wt 154 lb 3.2 oz (69.9 kg)   SpO2 99%   BMI 26.47 kg/m    History of Present Illness: Sonia [name] is a 76 y.o. 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  She was hospitalized with NSTEMI and then Upper GI bleed in May.  She is still feeling weak, and it is a slow process for her to recover.    She gets winded and tired.  She is not having cough, wheeze, or sputum.  She gets occasional chest discomfort.  She has follow up with Dr. Angelena Form with cardiology on 10/24/16.  She denies fever, hemoptysis, skin rash, or leg swelling.  Physical Exam:  General - pleasant Eyes - pupils reactive ENT - no sinus tenderness, no oral exudate, no LAN Cardiac - regular, no murmur Chest - no wheeze, rales Abd - soft, non tender Ext - no edema Skin - no rashes Neuro - normal strength Psych - normal mood   Discussion: She has symptoms of dyspnea and fatigue after recent NSTEMI complicated by Upper GI bleed.  It is difficult to determine whether her COPD and asthma are contributing to her current symptoms.  Assessment/Plan:  COPD with asthma. - will have her try trelegy in place of symbicort - if she doesn't notice improvement in her symptoms by augmenting her inhaler regimen, then I advised her to d/w cardiology further about whether her CAD or diastolic dysfunction are contributing  to her dyspnea and fatigue    Patient Instructions  Try using trelegy one puff daily, and rinse mouth after each use.  Call after using sample to update on whether trelegy works better than symbicort.  Don't use symbicort while using trelegy.  Follow up in 2 months    Time spent 27 minutes   Chesley Mires, MD [place] 10/11/2016, 12:21 PM Pager:  [phone]

## 2016-10-11 NOTE — Patient Instructions (Signed)
Try using trelegy one puff daily, and rinse mouth after each use.  Call after using sample to update on whether trelegy works better than symbicort.  Don't use symbicort while using trelegy.  Follow up in 2 months

## 2016-10-18 DIAGNOSIS — R42 Dizziness and giddiness: Secondary | ICD-10-CM | POA: Diagnosis not present

## 2016-10-18 DIAGNOSIS — E063 Autoimmune thyroiditis: Secondary | ICD-10-CM | POA: Diagnosis not present

## 2016-10-23 NOTE — Progress Notes (Signed)
Chief Complaint  Patient presents with  . Follow-up    CAD    History of Present Illness: 76 yo female with history of CAD, COPD, GERD and hypothyroidism who is here today for cardiac follow up. I am meeting her for the first time today. She was admitted to [facility] 2018 with c/o left arm pain and found to have a NSTEMI. Cardiac cath May 2018 with severe stenosis in the mid RCA treated with a drug eluting stent. There was a moderate stenosis in the mid LAD. Echo May 2018 with normal LV systolic function, mild valve disease. She was seen in the hospital by Dr. Burt Knack. She was discharged on Brilinta and readmitted one week later with a GI bleed. EGD showed gastritis. She was changed from Brilinta to Plavix and bleeding resolved.   She is here today for follow up. She has done well since her PCI. She has some mild chest pressure associated with dyspnea which improves with use of her inhaler. No palpitations, lower extremity edema, orthopnea, PND, near syncope or syncope. She has felt weak over the last few months. Recent Hgb of 10.8 (her baseline) and normal TSH in primary care 10/18/16.   Primary Care Physician: [name], MD  Past Medical History:  Diagnosis Date  . Arthritis   . Asthma    daily and prn inhalers; states good control currently  . CAD S/P percutaneous coronary angioplasty 07/15/2016  . COPD (chronic obstructive pulmonary disease) ([place])   . GERD (gastroesophageal reflux disease)    no current med.  . GI bleed 07/25/2016   Brilinta changed to Plavix  . Hashimoto's thyroiditis   . Headache    allergies; silent migraines  . Hiatal hernia   . Hypothyroidism   . Pneumonia   . 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 Vitamin D deficiency     Past Surgical History:  Procedure Laterality Date  . ABDOMINAL HYSTERECTOMY     partial  . APPENDECTOMY    . BREAST CYST EXCISION Left   . BREAST LUMPECTOMY WITH RADIOACTIVE SEED LOCALIZATION Left 04/07/2015   Procedure:  BREAST LUMPECTOMY WITH RADIOACTIVE SEED LOCALIZATION;  Surgeon: Erroll Luna, MD;  Location: [place];  Service: General;  Laterality: Left;  . CORONARY STENT INTERVENTION N/A 07/15/2016   Procedure: Coronary Stent Intervention;  Surgeon: Sherren Mocha, MD;  Location: [place];  Service: Cardiovascular;  Laterality: N/A;  . ESOPHAGOGASTRODUODENOSCOPY (EGD) WITH PROPOFOL Left 07/25/2016   Procedure: ESOPHAGOGASTRODUODENOSCOPY (EGD) WITH PROPOFOL;  Surgeon: Ronnette Juniper, MD;  Location: [facility];  Service: Gastroenterology;  Laterality: Left;  . KNEE ARTHROSCOPY Right 12/07/2003  . LAPAROSCOPIC SALPINGO OOPHERECTOMY Right 09/08/2003  . LEFT HEART CATH N/A 07/15/2016   Procedure: Left Heart Cath;  Surgeon: Sherren Mocha, MD;  Location: [place] [facility];  Service: Cardiovascular;  Laterality: N/A;  . LYSIS OF ADHESION  09/08/2003  . NASAL SINUS SURGERY  age 80  . RECTAL POLYPECTOMY  10/02/2006    Current Outpatient Prescriptions  Medication Sig Dispense Refill  . acetaminophen (TYLENOL) 325 MG tablet Take 2 tablets (650 mg total) by mouth every 6 (six) hours as needed for mild pain, fever or headache.    . albuterol (PROAIR HFA) 108 (90 Base) MCG/ACT inhaler Inhale 2 puffs into the lungs every 6 (six) hours as needed for wheezing or shortness of breath.    Marland Kitchen albuterol (PROVENTIL) (2.5 MG/3ML) 0.083% nebulizer solution Take 3 mLs (2.5 mg total) by nebulization every  6 (six) hours as needed for wheezing or shortness of breath. 75 mL 0  . aspirin EC 81 MG EC tablet Take 1 tablet (81 mg total) by mouth daily.    . budesonide-formoterol (SYMBICORT) 160-4.5 MCG/ACT inhaler Inhale 2 puffs into the lungs 2 (two) times daily. 3 Inhaler 3  . clopidogrel (PLAVIX) 75 MG tablet Take 1 tablet (75 mg total) by mouth daily. 30 tablet 11  . levothyroxine (SYNTHROID, LEVOTHROID) 137 MCG tablet Take 137 mcg by mouth daily before breakfast.    . nitroGLYCERIN (NITROSTAT) 0.4 MG SL tablet  Place 1 tablet (0.4 mg total) under the tongue every 5 (five) minutes as needed for chest pain (CP or SOB). 25 tablet 2  . pantoprazole (PROTONIX) 40 MG tablet Take 1 tablet (40 mg total) by mouth daily before breakfast. 30 tablet 4  . Fluticasone-Umeclidin-Vilant (TRELEGY ELLIPTA) 100-62.5-25 MCG/INH AEPB Inhale 1 puff into the lungs daily. (Patient not taking: Reported on 10/24/2016) 1 each 0  . rosuvastatin (CRESTOR) 10 MG tablet Take 1 tablet (10 mg total) by mouth daily. 30 tablet 11   No current facility-administered medications for this visit.     Allergies  Allergen Reactions  . Adenosine Shortness Of Breath  . Contrast Media [Iodinated Diagnostic Agents] Anaphylaxis  . Codeine Sulfate Nausea And Vomiting  . Latex Other (See Comments)    Skin irritation  . Adhesive [Tape] Other (See Comments)    SKIN IRRITATION    Social History   Social History  . Marital status: Divorced    Spouse name: N/A  . Number of children: 2  . Years of education: N/A   Occupational History  . 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 Smoking status: Never Smoker  . Smokeless tobacco: Never Used  . Alcohol use Yes     Comment: rarely  . Drug use: No  . Sexual activity: Not on file   Other Topics Concern  . Not on file   Social History Narrative  . No narrative on file    Family History  Problem Relation Age of Onset  . Asthma Mother   . Heart disease Mother   . Asthma Sister   . Heart disease Sister   . Breast cancer Sister 71  . Heart disease Father   . Kidney disease Father   . Prostate cancer Brother        dx. 67s  . Heart attack Brother 48  . Alzheimer's disease Brother 68  . Breast cancer Maternal Aunt 70  . Stomach cancer Maternal Grandfather 60  . Asthma Sister   . Memory loss Sister   . Heart attack Son 34  . Spinal muscular atrophy Grandchild        type II  . Cancer Other 25       niece dx. ca of salivary gland    . Breast cancer Other 40       niece; negative genetic testing 10 years ago  . Melanoma Other   . Alzheimer's disease Maternal Aunt   . Cervical cancer Cousin        maternal 1st cousin dx. at 19  . Breast cancer Cousin 55       maternal 1st cousin  . Lung cancer Cousin 68       maternal 1st cousin; smoker  . Heart disease Cousin   . Leukemia Cousin 14       paternal  1st cousin  . Breast cancer Other        paternal great grandmother (PGF's mother) dx. late 34s-early 23s  . Coronary artery disease Unknown     Review of Systems:  As stated in the HPI and otherwise negative.   BP 100/60   Pulse (!) 101   Ht 5\' 4"  (1.626 m)   Wt 155 lb 6.4 oz (70.5 kg)   SpO2 98%   BMI 26.67 kg/m   Physical Examination: General: Well developed, well nourished, NAD  HEENT: OP clear, mucus membranes moist  SKIN: warm, dry. No rashes. Neuro: No focal deficits  Musculoskeletal: Muscle strength 5/5 all ext  Psychiatric: Mood and affect normal  Neck: No JVD, no carotid bruits, no thyromegaly, no lymphadenopathy.  Lungs:Clear bilaterally, no wheezes, rhonci, crackles Cardiovascular: Regular rate and rhythm. No murmurs, gallops or rubs. Abdomen:Soft. Bowel sounds present. Non-tender.  Extremities: No lower extremity edema. Pulses are 2 + in the bilateral DP/PT.  Echo 07/16/16: - Left ventricle: The cavity size was normal. Systolic function was   normal. The estimated ejection fraction was in the range of 60%   to 65%. Wall motion was normal; there were no regional wall   motion abnormalities. Features are consistent with a pseudonormal   left ventricular filling pattern, with concomitant abnormal   relaxation and increased filling pressure (grade 2 diastolic   dysfunction). Doppler parameters are consistent with high   ventricular filling pressure. - Mitral valve: Calcified annulus. There was mild regurgitation. - Tricuspid valve: There was mild regurgitation. - Pulmonic valve: There was  trivial regurgitation. - Pulmonary arteries: PA peak pressure: 37 mm Hg (S). Impressions: - The right ventricular systolic pressure was increased consistent   with mild pulmonary hypertension.  EKG:  EKG is not ordered today. The ekg ordered today demonstrates   Recent Labs: 07/13/2016: B Natriuretic Peptide 202.9; TSH 0.782 07/24/2016: ALT 10 07/25/2016: BUN 19; Creatinine, Ser 0.95; Potassium 3.9; Sodium 140 07/26/2016: Hemoglobin 10.9; Platelets 165   Lipid Panel    Component Value Date/Time   CHOL 227 (H) 07/13/2016 2331   TRIG 60 07/13/2016 2331   HDL 76 07/13/2016 2331   CHOLHDL 3.0 07/13/2016 2331   VLDL 12 07/13/2016 2331   LDLCALC 139 (H) 07/13/2016 2331     Wt Readings from Last 3 Encounters:  10/24/16 155 lb 6.4 oz (70.5 kg)  10/11/16 154 lb 3.2 oz (69.9 kg)  08/14/16 153 lb 9.6 oz (69.7 kg)     Other studies Reviewed: Additional studies/ records that were reviewed today include: . Review of the above records demonstrates:   Assessment and Plan:   1. CAD without angina: She is having no chest pain suggestive of angina. Her resting chest pressure sounds like it is related to her asthma as it improves with use of her inhalers. She is on ASA, Plavix. She stopped her beta blocker due to fatigue and hypotension. She did not tolerate Lipitor. Will try Crestor 10 mg daily. Lipids and LFTs in 12 weeks.   2. HLD: See above. Start Crestor 10 mg daily and repeat lipids and LFTs in 12 weeks.   Current medicines are reviewed at length with the patient today.  The patient does not have concerns regarding medicines.  The following changes have been made:  no change  Labs/ tests ordered today include:   Orders Placed This Encounter  Procedures  . Lipid Profile  . Hepatic function panel     Disposition:   FU with me  in 6 months   Signed, [name], MD 10/24/2016 11:17 AM    [address], [place], [place]   [address] Phone: [phone]; Fax: [phone]

## 2016-10-24 ENCOUNTER — Ambulatory Visit (INDEPENDENT_AMBULATORY_CARE_PROVIDER_SITE_OTHER): Payer: Medicare Other | Admitting: Cardiovascular Disease

## 2016-10-24 ENCOUNTER — Encounter: Payer: Self-pay | Admitting: Cardiovascular Disease

## 2016-10-24 VITALS — BP 100/60 | HR 101 | Ht 64.0 in | Wt 155.4 lb

## 2016-10-24 DIAGNOSIS — Z9861 Coronary angioplasty status: Secondary | ICD-10-CM

## 2016-10-24 DIAGNOSIS — I2510 Atherosclerotic heart disease of native coronary artery without angina pectoris: Secondary | ICD-10-CM

## 2016-10-24 DIAGNOSIS — E785 Hyperlipidemia, unspecified: Secondary | ICD-10-CM

## 2016-10-24 MED ORDER — CLOPIDOGREL BISULFATE 75 MG PO TABS: 75.0000 mg | ORAL_TABLET | Freq: Every day | ORAL | 11 refills | Status: DC

## 2016-10-24 MED ORDER — ROSUVASTATIN CALCIUM 10 MG PO TABS: 10.0000 mg | ORAL_TABLET | Freq: Every day | ORAL | 11 refills | Status: DC

## 2016-10-24 NOTE — Patient Instructions (Signed)
Medication Instructions:  Your physician has recommended you make the following change in your medication:  Start Rosuvastatin 10 mg by mouth daily.   Labwork: Your physician recommends that you return for lab work in: about 12 weeks.  Scheduled for November 7,2018.  Lipid and liver profiles.  This will be fasting. The lab opens at 7:30 AM.   Testing/Procedures: none  Follow-Up: Your physician recommends that you schedule a follow-up appointment in: 6 months. Please call our office in about 3 months to schedule this appointment.     Any Other Special Instructions Will Be Listed Below (If Applicable).     If you need a refill on your cardiac medications before your next appointment, please call your pharmacy.

## 2016-10-31 ENCOUNTER — Telehealth (HOSPITAL_COMMUNITY): Payer: Self-pay

## 2016-10-31 NOTE — Telephone Encounter (Signed)
I called and spoke with patient about scheduling for cardiac rehab. Patient wants to put referral on hold. Patient would like to attend, but due to hot weather and breathing issues, patient wants to wait until weather gets cooler. Patient stated she will call back when ready to schedule. Patient confirmed contact information for cardiac rehab.

## 2016-11-19 ENCOUNTER — Telehealth: Payer: Self-pay | Admitting: Pulmonary Disease

## 2016-11-19 NOTE — Telephone Encounter (Signed)
lmtcb for pt. Need to make sure pt is not using Trelegy.

## 2016-11-20 MED ORDER — BUDESONIDE-FORMOTEROL FUMARATE 160-4.5 MCG/ACT IN AERO: 2.0000 | INHALATION_SPRAY | Freq: Two times a day (BID) | RESPIRATORY_TRACT | 3 refills | Status: DC

## 2016-11-20 MED ORDER — ALBUTEROL SULFATE HFA 108 (90 BASE) MCG/ACT IN AERS: 2.0000 | INHALATION_SPRAY | Freq: Four times a day (QID) | RESPIRATORY_TRACT | 3 refills | Status: DC | PRN

## 2016-11-20 NOTE — Telephone Encounter (Signed)
Patient is returning phone call.  °

## 2016-11-20 NOTE — Telephone Encounter (Signed)
Patient stated she never started Trelegy due to having a heart attack recently and she states the Symbicort is really keeping her managed so she doesn't want to change at this time. I sent in ProAir and Symbicort refills to [facility] Dr. Halford Chessman.

## 2016-11-21 NOTE — Telephone Encounter (Signed)
Noted  

## 2016-11-29 ENCOUNTER — Telehealth (HOSPITAL_COMMUNITY): Payer: Self-pay

## 2016-11-29 NOTE — Telephone Encounter (Signed)
I called patient to follow up about scheduling for cardiac rehab.patient stated that she is still not ready and it is too humid for her. Patient stated she will call us when ready. Referral deferred and paperwork placed in file cabinet.

## 2016-12-07 ENCOUNTER — Telehealth: Payer: Self-pay | Admitting: Pulmonary Disease

## 2016-12-07 MED ORDER — BUDESONIDE-FORMOTEROL FUMARATE 160-4.5 MCG/ACT IN AERO: 2.0000 | INHALATION_SPRAY | Freq: Two times a day (BID) | RESPIRATORY_TRACT | 0 refills | Status: DC

## 2016-12-07 NOTE — Telephone Encounter (Signed)
Called and spoke with pt and she is aware of samples of the symbicort that have been left up front for her.  Nothing further is needed.

## 2016-12-18 ENCOUNTER — Telehealth: Payer: Self-pay | Admitting: Pulmonary Disease

## 2016-12-18 NOTE — Telephone Encounter (Signed)
VS please advise if pt can be double booked on your schedule.  Thanks.

## 2016-12-18 NOTE — Telephone Encounter (Signed)
Okay to double book visit with me. 

## 2016-12-18 NOTE — Telephone Encounter (Signed)
Pt scheduled Friday at 9:00.  Nothing further needed.

## 2016-12-21 ENCOUNTER — Ambulatory Visit: Payer: Medicare Other | Admitting: Pulmonary Disease

## 2016-12-28 ENCOUNTER — Ambulatory Visit: Payer: Medicare Other | Admitting: Pulmonary Disease

## 2016-12-28 DIAGNOSIS — K921 Melena: Secondary | ICD-10-CM | POA: Diagnosis not present

## 2016-12-28 DIAGNOSIS — K219 Gastro-esophageal reflux disease without esophagitis: Secondary | ICD-10-CM | POA: Diagnosis not present

## 2016-12-28 DIAGNOSIS — K449 Diaphragmatic hernia without obstruction or gangrene: Secondary | ICD-10-CM | POA: Diagnosis not present

## 2016-12-28 DIAGNOSIS — Z86010 Personal history of colonic polyps: Secondary | ICD-10-CM | POA: Diagnosis not present

## 2017-01-04 DIAGNOSIS — H2513 Age-related nuclear cataract, bilateral: Secondary | ICD-10-CM | POA: Diagnosis not present

## 2017-01-16 ENCOUNTER — Other Ambulatory Visit: Payer: Medicare Other

## 2017-01-16 ENCOUNTER — Telehealth: Payer: Self-pay | Admitting: Cardiovascular Disease

## 2017-01-16 DIAGNOSIS — I2510 Atherosclerotic heart disease of native coronary artery without angina pectoris: Secondary | ICD-10-CM | POA: Diagnosis not present

## 2017-01-16 DIAGNOSIS — E785 Hyperlipidemia, unspecified: Secondary | ICD-10-CM | POA: Diagnosis not present

## 2017-01-16 LAB — LIPID PANEL
CHOL/HDL RATIO: 2.5 ratio (ref 0.0–4.4)
CHOLESTEROL TOTAL: 208 mg/dL — AB (ref 100–199)
HDL: 83 mg/dL (ref >39)
LDL Calculated: 106 mg/dL — ABNORMAL HIGH (ref 0–99)
TRIGLYCERIDES: 93 mg/dL (ref 0–149)
VLDL Cholesterol Cal: 19 mg/dL (ref 5–40)

## 2017-01-16 LAB — HEPATIC FUNCTION PANEL
ALK PHOS: 68 IU/L (ref 39–117)
ALT: 7 IU/L (ref 0–32)
AST: 19 IU/L (ref 0–40)
Albumin: 4.1 g/dL (ref 3.5–4.8)
BILIRUBIN, DIRECT: 0.09 mg/dL (ref 0.00–0.40)
Bilirubin Total: 0.3 mg/dL (ref 0.0–1.2)
Total Protein: 6.5 g/dL (ref 6.0–8.5)

## 2017-01-16 NOTE — Telephone Encounter (Signed)
I spoke with pt. She reports she is due for fasting blood work today. She has a history of low blood sugar and today she developed nausea and headache while fasting. She ate some yogurt.  She is asking if she needs to reschedule lab work.  She reports she frequently has these symptoms when she fasts. I told her it would be OK to have lab work done as planned today.

## 2017-01-16 NOTE — Telephone Encounter (Signed)
New Message     Pt is calling regarding her lab appointment, she has low blood sugar and can not go without eating , how is she suppose to do fasting blood work

## 2017-01-28 ENCOUNTER — Ambulatory Visit: Payer: Medicare Other

## 2017-01-28 ENCOUNTER — Ambulatory Visit (INDEPENDENT_AMBULATORY_CARE_PROVIDER_SITE_OTHER): Payer: Medicare Other

## 2017-01-28 ENCOUNTER — Telehealth: Payer: Self-pay | Admitting: Pulmonary Disease

## 2017-01-28 DIAGNOSIS — Z23 Encounter for immunization: Secondary | ICD-10-CM

## 2017-01-28 NOTE — Telephone Encounter (Signed)
We do not have Symbicort 160 samples at this time. Called pt to make aware.  Nothing further needed at this time.

## 2017-01-29 DIAGNOSIS — Z23 Encounter for immunization: Secondary | ICD-10-CM

## 2017-01-30 DIAGNOSIS — J449 Chronic obstructive pulmonary disease, unspecified: Secondary | ICD-10-CM | POA: Diagnosis not present

## 2017-01-30 DIAGNOSIS — K449 Diaphragmatic hernia without obstruction or gangrene: Secondary | ICD-10-CM | POA: Diagnosis not present

## 2017-01-30 DIAGNOSIS — N183 Chronic kidney disease, stage 3 (moderate): Secondary | ICD-10-CM | POA: Diagnosis not present

## 2017-01-30 DIAGNOSIS — K219 Gastro-esophageal reflux disease without esophagitis: Secondary | ICD-10-CM | POA: Diagnosis not present

## 2017-01-30 DIAGNOSIS — Z1389 Encounter for screening for other disorder: Secondary | ICD-10-CM | POA: Diagnosis not present

## 2017-01-30 DIAGNOSIS — I2510 Atherosclerotic heart disease of native coronary artery without angina pectoris: Secondary | ICD-10-CM | POA: Diagnosis not present

## 2017-01-30 DIAGNOSIS — Z85820 Personal history of malignant melanoma of skin: Secondary | ICD-10-CM | POA: Diagnosis not present

## 2017-01-30 DIAGNOSIS — Z7189 Other specified counseling: Secondary | ICD-10-CM | POA: Diagnosis not present

## 2017-01-30 DIAGNOSIS — E038 Other specified hypothyroidism: Secondary | ICD-10-CM | POA: Diagnosis not present

## 2017-01-30 DIAGNOSIS — Z0000 Encounter for general adult medical examination without abnormal findings: Secondary | ICD-10-CM | POA: Diagnosis not present

## 2017-02-04 DIAGNOSIS — L299 Pruritus, unspecified: Secondary | ICD-10-CM | POA: Diagnosis not present

## 2017-02-04 DIAGNOSIS — D229 Melanocytic nevi, unspecified: Secondary | ICD-10-CM | POA: Diagnosis not present

## 2017-02-04 DIAGNOSIS — L570 Actinic keratosis: Secondary | ICD-10-CM | POA: Diagnosis not present

## 2017-02-22 ENCOUNTER — Telehealth: Payer: Self-pay | Admitting: Cardiovascular Disease

## 2017-02-22 DIAGNOSIS — K921 Melena: Secondary | ICD-10-CM | POA: Diagnosis not present

## 2017-02-22 DIAGNOSIS — K219 Gastro-esophageal reflux disease without esophagitis: Secondary | ICD-10-CM | POA: Diagnosis not present

## 2017-02-22 DIAGNOSIS — M76811 Anterior tibial syndrome, right leg: Secondary | ICD-10-CM | POA: Diagnosis not present

## 2017-02-22 NOTE — Telephone Encounter (Signed)
This does not sound cardiac but she should call back if her pain returns. Thanks, chris

## 2017-02-22 NOTE — Telephone Encounter (Signed)
I spoke with pt and gave her information from Dr. McAlhany 

## 2017-02-22 NOTE — Telephone Encounter (Signed)
I spoke with pt. She reports she got up this morning around 6 AM and had "jabbing" type pain in upper left chest. She has never had this before.  Jabbing pain lasted only a few seconds and occurred every 2 minutes for 3 hours.  With MI she had arm pain and she did not have any arm pain this morning.  Also had palpitations this morning.  She used to have palpitation after drinking caffeine but has now stopped all caffeine. Jabbing pain and palpitations have now stopped and she states she will just watch for now.   Also reports she took Crestor 10 mg but started having nausea and neck/shoulder pain.  Decreased Crestor to 5 mg three times per week and is tolerating this dose. I told pt I would make Dr. Angelena Form aware of episode this AM.

## 2017-02-22 NOTE — Telephone Encounter (Signed)
Sonia [name] is calling to speak with a Nurse about an issue . Did not want to tell , didn't want to have to repeat  Rather just tell the nurse . Please Call

## 2017-03-13 DIAGNOSIS — M5030 Other cervical disc degeneration, unspecified cervical region: Secondary | ICD-10-CM | POA: Diagnosis not present

## 2017-03-20 DIAGNOSIS — M5030 Other cervical disc degeneration, unspecified cervical region: Secondary | ICD-10-CM | POA: Diagnosis not present

## 2017-03-21 ENCOUNTER — Telehealth: Payer: Self-pay | Admitting: Cardiovascular Disease

## 2017-03-21 NOTE — Telephone Encounter (Signed)
I spoke with pt. She reports she has bruising on her forearms. Also has hematoma on left forearm.  States bruise on arm is 9 inches in length. She has had bruising and hematoma before and it takes about 2 weeks for them to resolve. No active bleeding Pt had stent in May 2018 and I explained the need for Plavix for at least one year. She states she will continue Plavix. She has decreased Crestor to 2.5 mg and is trying to take 4 times per week. She decreased this due to headache, joint pain and clay/yellow colored stools.  No black or tarry stools or blood in stools. I told her I would make Dr. Angelena Form aware and call her back if he wanted to make any changes.

## 2017-03-21 NOTE — Telephone Encounter (Signed)
Mrs.Kinslow is calling to speak to about a medication ( Clopidogrel 75mg  ) she thinks she is having a reaction to it and wants to speak with you. Please call

## 2017-03-22 NOTE — Telephone Encounter (Signed)
I would agree that we need to continue Plavix for now if she can tolerate. cdm

## 2017-03-28 DIAGNOSIS — M5030 Other cervical disc degeneration, unspecified cervical region: Secondary | ICD-10-CM | POA: Diagnosis not present

## 2017-04-04 DIAGNOSIS — M5030 Other cervical disc degeneration, unspecified cervical region: Secondary | ICD-10-CM | POA: Diagnosis not present

## 2017-04-08 DIAGNOSIS — M5030 Other cervical disc degeneration, unspecified cervical region: Secondary | ICD-10-CM | POA: Diagnosis not present

## 2017-04-12 DIAGNOSIS — M5030 Other cervical disc degeneration, unspecified cervical region: Secondary | ICD-10-CM | POA: Diagnosis not present

## 2017-04-15 DIAGNOSIS — M5030 Other cervical disc degeneration, unspecified cervical region: Secondary | ICD-10-CM | POA: Diagnosis not present

## 2017-04-18 DIAGNOSIS — M5030 Other cervical disc degeneration, unspecified cervical region: Secondary | ICD-10-CM | POA: Diagnosis not present

## 2017-04-29 ENCOUNTER — Ambulatory Visit: Payer: Medicare Other | Admitting: Cardiovascular Disease

## 2017-04-29 NOTE — Progress Notes (Deleted)
No chief complaint on file.   History of Present Illness: 77 yo female with history of CAD, COPD, GERD and hypothyroidism who is here today for cardiac follow up. She was admitted to [facility] May 2018 with c/o left arm pain and found to have a NSTEMI. Cardiac cath May 2018 with severe stenosis in the mid RCA treated with a drug eluting stent. There was a moderate stenosis in the mid LAD. Echo May 2018 with normal LV systolic function, mild valve disease. She was discharged on Brilinta and readmitted one week later with a GI bleed. EGD showed gastritis. She was changed from Brilinta to Plavix and bleeding resolved.   She is here today for follow up. The patient denies any chest pain, dyspnea, palpitations, lower extremity edema, orthopnea, PND, dizziness, near syncope or syncope.   Primary Care Physician: [name], MD  Past Medical History:  Diagnosis Date  . Arthritis   . Asthma    daily and prn inhalers; states good control currently  . CAD S/P percutaneous coronary angioplasty 07/15/2016  . COPD (chronic obstructive pulmonary disease) ([place])   . GERD (gastroesophageal reflux disease)    no current med.  . GI bleed 07/25/2016   Brilinta changed to Plavix  . Hashimoto's thyroiditis   . Headache    allergies; silent migraines  . Hiatal hernia   . Hypothyroidism   . Pneumonia   . 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 Vitamin D deficiency     Past Surgical History:  Procedure Laterality Date  . ABDOMINAL HYSTERECTOMY     partial  . APPENDECTOMY    . BREAST CYST EXCISION Left   . BREAST LUMPECTOMY WITH RADIOACTIVE SEED LOCALIZATION Left 04/07/2015   Procedure: BREAST LUMPECTOMY WITH RADIOACTIVE SEED LOCALIZATION;  Surgeon: Erroll Luna, MD;  Location: [place];  Service: General;  Laterality: Left;  . CORONARY STENT INTERVENTION N/A 07/15/2016   Procedure: Coronary Stent Intervention;  Surgeon: Sherren Mocha, MD;  Location: [place] [facility];   Service: Cardiovascular;  Laterality: N/A;  . ESOPHAGOGASTRODUODENOSCOPY (EGD) WITH PROPOFOL Left 07/25/2016   Procedure: ESOPHAGOGASTRODUODENOSCOPY (EGD) WITH PROPOFOL;  Surgeon: Ronnette Juniper, MD;  Location: [place];  Service: Gastroenterology;  Laterality: Left;  . KNEE ARTHROSCOPY Right 12/07/2003  . LAPAROSCOPIC SALPINGO OOPHERECTOMY Right 09/08/2003  . LEFT HEART CATH N/A 07/15/2016   Procedure: Left Heart Cath;  Surgeon: Sherren Mocha, MD;  Location: [place] [facility];  Service: Cardiovascular;  Laterality: N/A;  . LYSIS OF ADHESION  09/08/2003  . NASAL SINUS SURGERY  age 57  . RECTAL POLYPECTOMY  10/02/2006    Current Outpatient Medications  Medication Sig Dispense Refill  . acetaminophen (TYLENOL) 325 MG tablet Take 2 tablets (650 mg total) by mouth every 6 (six) hours as needed for mild pain, fever or headache.    . albuterol (PROAIR HFA) 108 (90 Base) MCG/ACT inhaler Inhale 2 puffs into the lungs every 6 (six) hours as needed for wheezing or shortness of breath. 3 Inhaler 3  . albuterol (PROVENTIL) (2.5 MG/3ML) 0.083% nebulizer solution Take 3 mLs (2.5 mg total) by nebulization every 6 (six) hours as needed for wheezing or shortness of breath. 75 mL 0  . aspirin EC 81 MG EC tablet Take 1 tablet (81 mg total) by mouth daily.    . budesonide-formoterol (SYMBICORT) 160-4.5 MCG/ACT inhaler Inhale 2 puffs into the lungs 2 (two) times daily. 3 Inhaler 3  . budesonide-formoterol (SYMBICORT) 160-4.5 MCG/ACT inhaler Inhale 2 puffs into  the lungs 2 (two) times daily. 2 Inhaler 0  . clopidogrel (PLAVIX) 75 MG tablet Take 1 tablet (75 mg total) by mouth daily. 30 tablet 11  . Fluticasone-Umeclidin-Vilant (TRELEGY ELLIPTA) 100-62.5-25 MCG/INH AEPB Inhale 1 puff into the lungs daily. (Patient not taking: Reported on 10/24/2016) 1 each 0  . levothyroxine (SYNTHROID, LEVOTHROID) 137 MCG tablet Take 137 mcg by mouth daily before breakfast.    . nitroGLYCERIN (NITROSTAT) 0.4 MG SL tablet Place 1 tablet  (0.4 mg total) under the tongue every 5 (five) minutes as needed for chest pain (CP or SOB). 25 tablet 2  . pantoprazole (PROTONIX) 40 MG tablet Take 1 tablet (40 mg total) by mouth daily before breakfast. 30 tablet 4  . rosuvastatin (CRESTOR) 10 MG tablet Take 1 tablet (10 mg total) by mouth daily. 30 tablet 11   No current facility-administered medications for this visit.     Allergies  Allergen Reactions  . Adenosine Shortness Of Breath  . Contrast Media [Iodinated Diagnostic Agents] Anaphylaxis  . Codeine Sulfate Nausea And Vomiting  . Latex Other (See Comments)    Skin irritation  . Adhesive [Tape] Other (See Comments)    SKIN IRRITATION    Social History   Socioeconomic History  . Marital status: Divorced    Spouse name: Not on file  . Number of children: 2  . Years of education: Not on file  . Highest education level: Not on file  Social Needs  . Financial resource strain: Not on file  . Food insecurity - worry: Not on file  . Food insecurity - inability: Not on file  . Transportation needs - medical: Not on file  . Transportation needs - non-medical: Not on file  Occupational History  . 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 Smoking status: Never Smoker  . Smokeless tobacco: Never Used  Substance and Sexual Activity  . Alcohol use: Yes    Comment: rarely  . Drug use: No  . Sexual activity: Not on file  Other Topics Concern  . Not on file  Social History Narrative  . Not on file    Family History  Problem Relation Age of Onset  . Asthma Mother   . Heart disease Mother   . Asthma Sister   . Heart disease Sister   . Breast cancer Sister 4  . Heart disease Father   . Kidney disease Father   . Prostate cancer Brother        dx. 43s  . Heart attack Brother 62  . Alzheimer's disease Brother 18  . Breast cancer Maternal Aunt 70  . Stomach cancer Maternal Grandfather 60  . Asthma Sister   . Memory  loss Sister   . Heart attack Son 74  . Spinal muscular atrophy Grandchild        type II  . Cancer Other 79       niece dx. ca of salivary gland   . Breast cancer Other 35       niece; negative genetic testing 10 years ago  . Melanoma Other   . Alzheimer's disease Maternal Aunt   . Cervical cancer Cousin        maternal 1st cousin dx. at 55  . Breast cancer Cousin 89       maternal 1st cousin  . Lung cancer Cousin 66       maternal 1st cousin; smoker  . Heart disease Cousin   .  Leukemia Cousin 14       paternal 1st cousin  . Breast cancer Other        paternal great grandmother (PGF's mother) dx. late 64s-early 63s  . Coronary artery disease Unknown     Review of Systems:  As stated in the HPI and otherwise negative.   There were no vitals taken for this visit.  Physical Examination:  General: Well developed, well nourished, NAD  HEENT: OP clear, mucus membranes moist  SKIN: warm, dry. No rashes. Neuro: No focal deficits  Musculoskeletal: Muscle strength 5/5 all ext  Psychiatric: Mood and affect normal  Neck: No JVD, no carotid bruits, no thyromegaly, no lymphadenopathy.  Lungs:Clear bilaterally, no wheezes, rhonci, crackles Cardiovascular: Regular rate and rhythm. No murmurs, gallops or rubs. Abdomen:Soft. Bowel sounds present. Non-tender.  Extremities: No lower extremity edema. Pulses are 2 + in the bilateral DP/PT.  Echo 07/16/16: - Left ventricle: The cavity size was normal. Systolic function was   normal. The estimated ejection fraction was in the range of 60%   to 65%. Wall motion was normal; there were no regional wall   motion abnormalities. Features are consistent with a pseudonormal   left ventricular filling pattern, with concomitant abnormal   relaxation and increased filling pressure (grade 2 diastolic   dysfunction). Doppler parameters are consistent with high   ventricular filling pressure. - Mitral valve: Calcified annulus. There was mild  regurgitation. - Tricuspid valve: There was mild regurgitation. - Pulmonic valve: There was trivial regurgitation. - Pulmonary arteries: PA peak pressure: 37 mm Hg (S). Impressions: - The right ventricular systolic pressure was increased consistent   with mild pulmonary hypertension.  EKG:  EKG is not *** ordered today. The ekg ordered today demonstrates   Recent Labs: 07/13/2016: B Natriuretic Peptide 202.9; TSH 0.782 07/25/2016: BUN 19; Creatinine, Ser 0.95; Potassium 3.9; Sodium 140 07/26/2016: Hemoglobin 10.9; Platelets 165 01/16/2017: ALT 7   Lipid Panel    Component Value Date/Time   CHOL 208 (H) 01/16/2017 1036   TRIG 93 01/16/2017 1036   HDL 83 01/16/2017 1036   CHOLHDL 2.5 01/16/2017 1036   CHOLHDL 3.0 07/13/2016 2331   VLDL 12 07/13/2016 2331   LDLCALC 106 (H) 01/16/2017 1036     Wt Readings from Last 3 Encounters:  10/24/16 155 lb 6.4 oz (70.5 kg)  10/11/16 154 lb 3.2 oz (69.9 kg)  08/14/16 153 lb 9.6 oz (69.7 kg)     Other studies Reviewed: Additional studies/ records that were reviewed today include: . Review of the above records demonstrates:   Assessment and Plan:   1. CAD without angina: No chest pain suggestive of angina. Will continue ASA, Plavix and statin. She stopped her beta blocker due to fatigue and hypotension.    2. HLD: Continue statin.   Current medicines are reviewed at length with the patient today.  The patient does not have concerns regarding medicines.  The following changes have been made:  no change  Labs/ tests ordered today include:   No orders of the defined types were placed in this encounter.    Disposition:   FU with me in 12 months   Signed, [name], MD 04/29/2017 6:33 AM    [facility] [place], [place]  [address] Phone: [phone]; Fax: [phone]

## 2017-04-30 DIAGNOSIS — M5030 Other cervical disc degeneration, unspecified cervical region: Secondary | ICD-10-CM | POA: Diagnosis not present

## 2017-05-03 DIAGNOSIS — M5030 Other cervical disc degeneration, unspecified cervical region: Secondary | ICD-10-CM | POA: Diagnosis not present

## 2017-05-07 DIAGNOSIS — M5030 Other cervical disc degeneration, unspecified cervical region: Secondary | ICD-10-CM | POA: Diagnosis not present

## 2017-05-10 DIAGNOSIS — M5030 Other cervical disc degeneration, unspecified cervical region: Secondary | ICD-10-CM | POA: Diagnosis not present

## 2017-05-20 NOTE — Progress Notes (Signed)
Cardiology Office Note    Date:  05/21/2017   ID:  Sonia [name], DOB [birth year], MRN [id number]  PCP:  [name], MD  Cardiologist: [name], MD  Chief Complaint  Patient presents with  . Follow-up    History of Present Illness:  Sonia [name] is a 77 y.o. female with history of CAD status post NSTEMI 07/2016 treated with DES to the RCA with residual moderate stenosis in the mid LAD.  Echo 07/2016 normal LV function with mild valvular disease.  Discharged on Brilinta and readmitted 1 week later with GI bleed.  Was found to have gastritis on EGD and Brilinta changed to Plavix and bleeding resolved.  He also has COPD, GERD and hypothyroidism.  Last saw Dr. Angelena Form 10/2016 complaining of mild chest pressure associated with dyspnea that improved with her inhaler.  Crestor was started.  She had stopped her beta-blocker secondary to fatigue and hypotension.  Patient comes in today for routine follow-up.  She has had several episodes of right arm pain similar to when she had her MI.  She never had chest pain.  All the episodes were at rest and relieved within minutes.  She did not use nitroglycerin.  She always has trouble with her asthma this time a year and has not been able to exercise at all.  She also has some skipping and palpitations.  She never did start the Crestor but had follow-up lipids and LDL was still 108 but down from 139.  She cut out red meats.    Past Medical History:  Diagnosis Date  . Arthritis   . Asthma    daily and prn inhalers; states good control currently  . CAD S/P percutaneous coronary angioplasty 07/15/2016  . COPD (chronic obstructive pulmonary disease) ([place])   . GERD (gastroesophageal reflux disease)    no current med.  . GI bleed 07/25/2016   Brilinta changed to Plavix  . Hashimoto's thyroiditis   . Headache    allergies; silent migraines  . Hiatal hernia   . Hypothyroidism   . Pneumonia   . 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 Vitamin D deficiency     Past Surgical History:  Procedure Laterality Date  . ABDOMINAL HYSTERECTOMY     partial  . APPENDECTOMY    . BREAST CYST EXCISION Left   . BREAST LUMPECTOMY WITH RADIOACTIVE SEED LOCALIZATION Left 04/07/2015   Procedure: BREAST LUMPECTOMY WITH RADIOACTIVE SEED LOCALIZATION;  Surgeon: Erroll Luna, MD;  Location: [facility];  Service: General;  Laterality: Left;  . CORONARY STENT INTERVENTION N/A 07/15/2016   Procedure: Coronary Stent Intervention;  Surgeon: Sherren Mocha, MD;  Location: [facility];  Service: Cardiovascular;  Laterality: N/A;  . ESOPHAGOGASTRODUODENOSCOPY (EGD) WITH PROPOFOL Left 07/25/2016   Procedure: ESOPHAGOGASTRODUODENOSCOPY (EGD) WITH PROPOFOL;  Surgeon: Ronnette Juniper, MD;  Location: [facility];  Service: Gastroenterology;  Laterality: Left;  . KNEE ARTHROSCOPY Right 12/07/2003  . LAPAROSCOPIC SALPINGO OOPHERECTOMY Right 09/08/2003  . LEFT HEART CATH N/A 07/15/2016   Procedure: Left Heart Cath;  Surgeon: Sherren Mocha, MD;  Location: [facility];  Service: Cardiovascular;  Laterality: N/A;  . LYSIS OF ADHESION  09/08/2003  . NASAL SINUS SURGERY  age 57  . RECTAL POLYPECTOMY  10/02/2006    Current Medications: Current Meds  Medication Sig  . acetaminophen (TYLENOL) 325 MG tablet Take 2 tablets (650 mg total) by mouth every 6 (six) hours as needed for mild pain, fever or headache.  Marland Kitchen  albuterol (PROAIR HFA) 108 (90 Base) MCG/ACT inhaler Inhale 2 puffs into the lungs every 6 (six) hours as needed for wheezing or shortness of breath.  Marland Kitchen albuterol (PROVENTIL) (2.5 MG/3ML) 0.083% nebulizer solution Take 3 mLs (2.5 mg total) by nebulization every 6 (six) hours as needed for wheezing or shortness of breath.  Marland Kitchen aspirin EC 81 MG EC tablet Take 1 tablet (81 mg total) by mouth daily.  . budesonide-formoterol (SYMBICORT) 160-4.5 MCG/ACT inhaler Inhale 2 puffs into the lungs 2 (two) times daily.  . clopidogrel (PLAVIX) 75  MG tablet Take 1 tablet (75 mg total) by mouth daily.  Marland Kitchen levothyroxine (SYNTHROID, LEVOTHROID) 137 MCG tablet Take 137 mcg by mouth daily before breakfast.  . nitroGLYCERIN (NITROSTAT) 0.4 MG SL tablet Place 1 tablet (0.4 mg total) under the tongue every 5 (five) minutes as needed for chest pain (CP or SOB).  . [DISCONTINUED] Fluticasone-Umeclidin-Vilant (TRELEGY ELLIPTA) 100-62.5-25 MCG/INH AEPB Inhale 1 puff into the lungs daily.  . [DISCONTINUED] pantoprazole (PROTONIX) 40 MG tablet Take 1 tablet (40 mg total) by mouth daily before breakfast.     Allergies:   Adenosine; Contrast media [iodinated diagnostic agents]; Other; Codeine sulfate; Latex; and Adhesive [tape]   Social History   Socioeconomic History  . Marital status: Divorced    Spouse name: None  . Number of children: 2  . Years of education: None  . Highest education level: None  Social Needs  . Financial resource strain: None  . Food insecurity - worry: None  . Food insecurity - inability: None  . Transportation needs - medical: None  . Transportation needs - non-medical: None  Occupational History  . 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 Smoking status: Never Smoker  . Smokeless tobacco: Never Used  Substance and Sexual Activity  . Alcohol use: Yes    Comment: rarely  . Drug use: No  . Sexual activity: None  Other Topics Concern  . None  Social History Narrative  . None     Family History:  The patient's family history includes Alzheimer's disease in her maternal aunt; Alzheimer's disease (age of onset: 33) in her brother; Asthma in her mother, sister, and sister; Breast cancer in her other; Breast cancer (age of onset: 48) in her other; Breast cancer (age of onset: 31) in her sister; Breast cancer (age of onset: 26) in her maternal aunt; Breast cancer (age of onset: 29) in her cousin; Cancer (age of onset: 72) in her other; Cervical cancer in her cousin;  Coronary artery disease in her unknown relative; Heart attack (age of onset: 39) in her brother; Heart attack (age of onset: 11) in her son; Heart disease in her cousin, father, mother, and sister; Kidney disease in her father; Leukemia (age of onset: 53) in her cousin; Lung cancer (age of onset: 68) in her cousin; Melanoma in her other; Memory loss in her sister; Prostate cancer in her brother; Spinal muscular atrophy in her grandchild; Stomach cancer (age of onset: 28) in her maternal grandfather.   ROS:   Please see the history of present illness.    Review of Systems  Constitution: Positive for weakness.  HENT: Negative.   Eyes: Negative.   Cardiovascular: Positive for dyspnea on exertion and leg swelling.       Right arm pain similar to her prior angina  Respiratory: Positive for shortness of breath and wheezing.   Hematologic/Lymphatic: Negative.   Musculoskeletal: Negative.  Negative for joint pain.  Gastrointestinal: Negative.   Genitourinary: Negative.    All other systems reviewed and are negative.   PHYSICAL EXAM:   VS:  BP 112/60   Pulse 92   Ht 5\' 4"  (1.626 m)   Wt 154 lb 6.4 oz (70 kg)   SpO2 98%   BMI 26.50 kg/m   Physical Exam  GEN: Well nourished, well developed, in no acute distress  Neck: no JVD, carotid bruits, or masses Cardiac:RRR; no murmurs, rubs, or gallops  Respiratory: Decreased breath sounds but clear to auscultation bilaterally, normal work of breathing GI: soft, nontender, nondistended, + BS Ext: without cyanosis, clubbing, or edema, Good distal pulses bilaterally Neuro:  Alert and Oriented x 3 Psych: euthymic mood, full affect  Wt Readings from Last 3 Encounters:  05/21/17 154 lb 6.4 oz (70 kg)  10/24/16 155 lb 6.4 oz (70.5 kg)  10/11/16 154 lb 3.2 oz (69.9 kg)      Studies/Labs Reviewed:   EKG:  EKG is ordered today.  The ekg ordered today demonstrates normal sinus rhythm at 92 bpm with nonspecific ST-T wave changes actually improved from  EKG 07/2016  Recent Labs: 07/13/2016: B Natriuretic Peptide 202.9; TSH 0.782 07/25/2016: BUN 19; Creatinine, Ser 0.95; Potassium 3.9; Sodium 140 07/26/2016: Hemoglobin 10.9; Platelets 165 01/16/2017: ALT 7   Lipid Panel    Component Value Date/Time   CHOL 208 (H) 01/16/2017 1036   TRIG 93 01/16/2017 1036   HDL 83 01/16/2017 1036   CHOLHDL 2.5 01/16/2017 1036   CHOLHDL 3.0 07/13/2016 2331   VLDL 12 07/13/2016 2331   LDLCALC 106 (H) 01/16/2017 1036    Additional studies/ records that were reviewed today include:  Echo 07/16/16: - Left ventricle: The cavity size was normal. Systolic function was   normal. The estimated ejection fraction was in the range of 60%   to 65%. Wall motion was normal; there were no regional wall   motion abnormalities. Features are consistent with a pseudonormal   left ventricular filling pattern, with concomitant abnormal   relaxation and increased filling pressure (grade 2 diastolic   dysfunction). Doppler parameters are consistent with high   ventricular filling pressure. - Mitral valve: Calcified annulus. There was mild regurgitation. - Tricuspid valve: There was mild regurgitation. - Pulmonic valve: There was trivial regurgitation. - Pulmonary arteries: PA peak pressure: 37 mm Hg (S).  Impressions:  - The right ventricular systolic pressure was increased consistent   with mild pulmonary hypertension.    Cardiac catheterization 5/2018Conclusion   1. Severe single-vessel coronary artery disease with severe stenosis of the RCA treated 6 escalate with PCI using a drug-eluting stent (3.0 x 28 mm Promus DES postdilated to 3.5 mm)   2. Nonobstructive LAD stenosis   3. Normal LV systolic function with mild mitral regurgitation   Recommend:  DAPT with ASA and brilinta without interruption x 12 months  If no complications arise OK for discharge tomorrow      ASSESSMENT:    1. CAD S/P percutaneous coronary angioplasty   2. Dyslipidemia   3. COPD  with asthma ([place])   4. Left arm pain      PLAN:  In order of problems listed above:  CAD status post NSTEMI treated with DES to the RCA 07/2016 with nonobstructive LAD stenosis and normal LV function with mild MR.  GI bleed on Brilinta and now on Plavix.  Has had several episodes of right arm pain similar to her prior angina.  Have recommended  dobutamine Myoview or Lexiscan.  Patient insisted on waiting until after spring asthma season.  She says she has reactions to everything and is reluctant to have anything done at this time.  She prefers dobutamine if anything.  She has no active wheezing today.  Lots of time spent reviewing her catheter report.  She said she will call to schedule dobutamine Myoview at [facility].  Follow-up with Dr. Angelena Form in June.  Dyslipidemia never started Crestor.  She says she is willing to try it 3 days a week but will not take it daily.  Check fasting lipid panel and LFTs in 6 weeks.  COPD with asthma no active wheezing today but always has trouble in the spring.  Medication Adjustments/Labs and Tests Ordered: Current medicines are reviewed at length with the patient today.  Concerns regarding medicines are outlined above.  Medication changes, Labs and Tests ordered today are listed in the Patient Instructions below. Patient Instructions  Medication Instructions:  Your physician recommends that you continue on your current medications as directed. Please refer to the Current Medication list given to you today.   Labwork: None ordered  Testing/Procedures: None ordered  Follow-Up: Your physician recommends that you schedule a follow-up appointment in:    Any Other Special Instructions Will Be Listed Below (If Applicable).     If you need a refill on your cardiac medications before your next appointment, please call your pharmacy.      Sumner Boast, PA-C  05/21/2017 10:05 AM    [facility] [address],  [place], [place]  [address] Phone: [phone]; Fax: [phone]

## 2017-05-21 ENCOUNTER — Ambulatory Visit (INDEPENDENT_AMBULATORY_CARE_PROVIDER_SITE_OTHER): Payer: Medicare Other | Admitting: Physician Assistant

## 2017-05-21 ENCOUNTER — Encounter: Payer: Self-pay | Admitting: *Deleted

## 2017-05-21 ENCOUNTER — Encounter: Payer: Self-pay | Admitting: Physician Assistant

## 2017-05-21 VITALS — BP 112/60 | HR 92 | Ht 64.0 in | Wt 154.4 lb

## 2017-05-21 DIAGNOSIS — M79602 Pain in left arm: Secondary | ICD-10-CM | POA: Diagnosis not present

## 2017-05-21 DIAGNOSIS — J449 Chronic obstructive pulmonary disease, unspecified: Secondary | ICD-10-CM

## 2017-05-21 DIAGNOSIS — Z9861 Coronary angioplasty status: Secondary | ICD-10-CM

## 2017-05-21 DIAGNOSIS — I2510 Atherosclerotic heart disease of native coronary artery without angina pectoris: Secondary | ICD-10-CM | POA: Diagnosis not present

## 2017-05-21 DIAGNOSIS — J4489 Other specified chronic obstructive pulmonary disease: Secondary | ICD-10-CM

## 2017-05-21 DIAGNOSIS — M5030 Other cervical disc degeneration, unspecified cervical region: Secondary | ICD-10-CM | POA: Diagnosis not present

## 2017-05-21 DIAGNOSIS — E785 Hyperlipidemia, unspecified: Secondary | ICD-10-CM | POA: Diagnosis not present

## 2017-05-21 MED ORDER — ROSUVASTATIN CALCIUM 10 MG PO TABS
ORAL_TABLET | ORAL | 3 refills | Status: DC
Start: 2017-05-21 — End: 2017-10-28

## 2017-05-21 NOTE — Patient Instructions (Addendum)
Medication Instructions:  Your physician has recommended you make the following change in your medication:  1.  START Crestor 10 mg taking 1 tablet 3 TIMES A WEEK .  Labwork: 07/02/17:  ANYTIME THAT MORNING FOR FASTING LIPID & LFT  Testing/Procedures: Your physician has requested that you have a dobutamine myoview. For furth information please visit [email or web address]. Please follow instruction sheet, as given.    Follow-Up: Your physician recommends that you schedule a follow-up appointment in: WITH DR. Angelena Form IN June, 2019   Any Other Special Instructions Will Be Listed Below (If Applicable).     If you need a refill on your cardiac medications before your next appointment, please call your pharmacy.

## 2017-05-24 DIAGNOSIS — M5030 Other cervical disc degeneration, unspecified cervical region: Secondary | ICD-10-CM | POA: Diagnosis not present

## 2017-05-28 DIAGNOSIS — M5030 Other cervical disc degeneration, unspecified cervical region: Secondary | ICD-10-CM | POA: Diagnosis not present

## 2017-05-30 DIAGNOSIS — E039 Hypothyroidism, unspecified: Secondary | ICD-10-CM | POA: Diagnosis not present

## 2017-05-31 DIAGNOSIS — M5030 Other cervical disc degeneration, unspecified cervical region: Secondary | ICD-10-CM | POA: Diagnosis not present

## 2017-06-05 DIAGNOSIS — E039 Hypothyroidism, unspecified: Secondary | ICD-10-CM | POA: Diagnosis not present

## 2017-06-07 ENCOUNTER — Emergency Department (HOSPITAL_COMMUNITY)
Admission: EM | Admit: 2017-06-07 | Discharge: 2017-06-07 | Disposition: A | Discharge status: Home / Self Care | Payer: Medicare Other | Attending: Physician Assistant | Admitting: Physician Assistant

## 2017-06-07 ENCOUNTER — Encounter (HOSPITAL_COMMUNITY): Payer: Self-pay | Admitting: *Deleted

## 2017-06-07 ENCOUNTER — Emergency Department (HOSPITAL_COMMUNITY): Payer: Medicare Other

## 2017-06-07 DIAGNOSIS — R079 Chest pain, unspecified: Secondary | ICD-10-CM | POA: Diagnosis not present

## 2017-06-07 DIAGNOSIS — Z5321 Procedure and treatment not carried out due to patient leaving prior to being seen by health care provider: Secondary | ICD-10-CM | POA: Insufficient documentation

## 2017-06-07 LAB — BASIC METABOLIC PANEL
ANION GAP: 8 (ref 5–15)
BUN: 23 mg/dL — AB (ref 6–20)
CALCIUM: 9.1 mg/dL (ref 8.9–10.3)
CO2: 24 mmol/L (ref 22–32)
CREATININE: 0.98 mg/dL (ref 0.44–1.00)
Chloride: 107 mmol/L (ref 101–111)
GFR calc Af Amer: >60 mL/min (ref >60)
GFR calc non Af Amer: 54 mL/min — ABNORMAL LOW (ref >60)
GLUCOSE: 96 mg/dL (ref 65–99)
Potassium: 4.2 mmol/L (ref 3.5–5.1)
Sodium: 139 mmol/L (ref 135–145)

## 2017-06-07 LAB — CBC
HCT: 37.5 % (ref 36.0–46.0)
HEMOGLOBIN: 11.6 g/dL — AB (ref 12.0–15.0)
MCH: 25.6 pg — AB (ref 26.0–34.0)
MCHC: 30.9 g/dL (ref 30.0–36.0)
MCV: 82.8 fL (ref 78.0–100.0)
Platelets: 153 10*3/uL (ref 150–400)
RBC: 4.53 MIL/uL (ref 3.87–5.11)
RDW: 16.7 % — AB (ref 11.5–15.5)
WBC: 8.6 10*3/uL (ref 4.0–10.5)

## 2017-06-07 LAB — I-STAT TROPONIN, ED: TROPONIN I, POC: 0 ng/mL (ref 0.00–0.08)

## 2017-06-07 NOTE — ED Notes (Signed)
Pt stated that she could not wait here 8 hrs.  Pt asked about her cardiac enzymes.  I informed her that I saw nothing about her labs that would indicate she needed to be rushed back., but I could not advise her to leave.  Pt stated that she would leave and come back if symptoms worsen.

## 2017-06-11 DIAGNOSIS — M5030 Other cervical disc degeneration, unspecified cervical region: Secondary | ICD-10-CM | POA: Diagnosis not present

## 2017-06-12 ENCOUNTER — Telehealth: Payer: Self-pay | Admitting: *Deleted

## 2017-06-12 NOTE — Telephone Encounter (Signed)
I spoke with pt who reports Ermalinda Barrios, PA had suggested she have a stress test done. Pt states she does not want to have any type of stress testing done but will do echocardiogram if physician wants her to have.   She has followed with Dr. Debbora Presto regarding thyroid and medications are being adjusted. Pt thinks a lot of her issues are related to her sleep patterns. She has not been taking Rosuvastatin 10 mg that was ordered at last office visit. She states she will take 5 mg starting today. I cancelled follow up lab work scheduled for 4/23 and pt will have fasting lab work done on 5/15. I scheduled pt to see Dr Angelena Form for follow up on August 19 at 11:40 Will forward to Dr. Angelena Form to see if he would like pt to have echocardiogram.

## 2017-06-13 DIAGNOSIS — M5030 Other cervical disc degeneration, unspecified cervical region: Secondary | ICD-10-CM | POA: Diagnosis not present

## 2017-06-13 NOTE — Telephone Encounter (Signed)
I don't think she needs an echo. Gerald Stabs

## 2017-06-13 NOTE — Telephone Encounter (Signed)
Pt.notified

## 2017-06-20 DIAGNOSIS — M5030 Other cervical disc degeneration, unspecified cervical region: Secondary | ICD-10-CM | POA: Diagnosis not present

## 2017-06-27 ENCOUNTER — Telehealth (HOSPITAL_COMMUNITY): Payer: Self-pay | Admitting: Physician Assistant

## 2017-06-27 DIAGNOSIS — M5030 Other cervical disc degeneration, unspecified cervical region: Secondary | ICD-10-CM | POA: Diagnosis not present

## 2017-06-27 NOTE — Telephone Encounter (Signed)
05-21-17 pt will call back to schedule test/dp 05-30-17 called pt to schedule stress test, she will call us back to schedule.dp 06/12/17 Called pt and lmsg for her to CB to get scheduled for a myoview..RG 06/27/17 Spoke with pt and she stated that she is allergic nuclear dyes and the pollen is effecting her allergies and needed to think about having the test..RG

## 2017-07-01 DIAGNOSIS — M5030 Other cervical disc degeneration, unspecified cervical region: Secondary | ICD-10-CM | POA: Diagnosis not present

## 2017-07-02 ENCOUNTER — Other Ambulatory Visit: Payer: Medicare Other

## 2017-07-05 DIAGNOSIS — M5030 Other cervical disc degeneration, unspecified cervical region: Secondary | ICD-10-CM | POA: Diagnosis not present

## 2017-07-11 DIAGNOSIS — M5030 Other cervical disc degeneration, unspecified cervical region: Secondary | ICD-10-CM | POA: Diagnosis not present

## 2017-07-19 ENCOUNTER — Other Ambulatory Visit: Payer: Self-pay | Admitting: Cardiology

## 2017-07-19 NOTE — Telephone Encounter (Signed)
REFILL 

## 2017-07-22 DIAGNOSIS — E039 Hypothyroidism, unspecified: Secondary | ICD-10-CM | POA: Diagnosis not present

## 2017-07-22 DIAGNOSIS — E1165 Type 2 diabetes mellitus with hyperglycemia: Secondary | ICD-10-CM | POA: Diagnosis not present

## 2017-07-22 DIAGNOSIS — R7301 Impaired fasting glucose: Secondary | ICD-10-CM | POA: Diagnosis not present

## 2017-07-24 ENCOUNTER — Other Ambulatory Visit: Payer: Medicare Other

## 2017-07-25 ENCOUNTER — Other Ambulatory Visit: Payer: Medicare Other

## 2017-07-26 ENCOUNTER — Other Ambulatory Visit: Payer: Self-pay

## 2017-07-26 DIAGNOSIS — I2510 Atherosclerotic heart disease of native coronary artery without angina pectoris: Secondary | ICD-10-CM | POA: Diagnosis not present

## 2017-07-26 DIAGNOSIS — Z9861 Coronary angioplasty status: Secondary | ICD-10-CM | POA: Diagnosis not present

## 2017-07-26 DIAGNOSIS — E785 Hyperlipidemia, unspecified: Secondary | ICD-10-CM | POA: Diagnosis not present

## 2017-07-26 LAB — LIPID PANEL W/O CHOL/HDL RATIO
Cholesterol, Total: 174 mg/dL (ref 100–199)
HDL: 76 mg/dL (ref >39)
LDL Calculated: 85 mg/dL (ref 0–99)
Triglycerides: 65 mg/dL (ref 0–149)
VLDL Cholesterol Cal: 13 mg/dL (ref 5–40)

## 2017-07-26 LAB — HEPATIC FUNCTION PANEL
ALBUMIN: 4.1 g/dL (ref 3.5–4.8)
ALK PHOS: 65 IU/L (ref 39–117)
ALT: 6 IU/L (ref 0–32)
AST: 15 IU/L (ref 0–40)
BILIRUBIN TOTAL: 0.4 mg/dL (ref 0.0–1.2)
BILIRUBIN, DIRECT: 0.13 mg/dL (ref 0.00–0.40)
Total Protein: 6.2 g/dL (ref 6.0–8.5)

## 2017-07-29 ENCOUNTER — Telehealth: Payer: Self-pay | Admitting: Pulmonary Disease

## 2017-07-29 NOTE — Telephone Encounter (Signed)
Called and spoke with patient, advised her that we do not have any samples of symbicort 160 at this time. Advised patient she can try back at the end of the week to see if any came in.

## 2017-08-02 ENCOUNTER — Telehealth: Payer: Self-pay | Admitting: Pulmonary Disease

## 2017-08-02 MED ORDER — BUDESONIDE-FORMOTEROL FUMARATE 160-4.5 MCG/ACT IN AERO: 2.0000 | INHALATION_SPRAY | Freq: Two times a day (BID) | RESPIRATORY_TRACT | 0 refills | Status: DC

## 2017-08-02 NOTE — Telephone Encounter (Signed)
Called and spoke with Patient. Symbicort 160 samples placed at front for pick up.  Patient stated understanding. Nothing further needed At this time.

## 2017-08-07 ENCOUNTER — Ambulatory Visit (INDEPENDENT_AMBULATORY_CARE_PROVIDER_SITE_OTHER): Payer: Medicare Other | Admitting: Internal Medicine

## 2017-08-07 ENCOUNTER — Encounter: Payer: Self-pay | Admitting: Internal Medicine

## 2017-08-07 VITALS — BP 114/78 | HR 90 | Ht 64.0 in | Wt 149.6 lb

## 2017-08-07 DIAGNOSIS — J45901 Unspecified asthma with (acute) exacerbation: Secondary | ICD-10-CM | POA: Diagnosis not present

## 2017-08-07 DIAGNOSIS — Z9861 Coronary angioplasty status: Secondary | ICD-10-CM

## 2017-08-07 DIAGNOSIS — I2510 Atherosclerotic heart disease of native coronary artery without angina pectoris: Secondary | ICD-10-CM | POA: Diagnosis not present

## 2017-08-07 LAB — NITRIC OXIDE: Nitric Oxide: 35

## 2017-08-07 MED ORDER — BUDESONIDE-FORMOTEROL FUMARATE 160-4.5 MCG/ACT IN AERO: 2.0000 | INHALATION_SPRAY | Freq: Two times a day (BID) | RESPIRATORY_TRACT | 0 refills | Status: DC

## 2017-08-07 MED ORDER — PREDNISONE 10 MG PO TABS: ORAL_TABLET | ORAL | 1 refills | Status: DC

## 2017-08-07 NOTE — Addendum Note (Signed)
Addended by: Lorretta Harp on: 08/07/2017 10:16 AM   Modules accepted: Orders

## 2017-08-07 NOTE — Addendum Note (Signed)
Addended by: Lorretta Harp on: 08/07/2017 10:10 AM   Modules accepted: Orders

## 2017-08-07 NOTE — Patient Instructions (Signed)
ICD-10-CM   1. Persistent asthma with acute exacerbation, unspecified asthma severity J45.901     Flare up due to humidity  Plan - you are doing all the right things to avoid humidity  - continue symbicort scheduled  - do prednisone taper for 6 days as follows  -Prednisone 20mg  daily x 2 days, then 10mg  daily x 2 days, then 5mg  daily x 2 days and stop   - we are doing lower dose based on shared decision making    - send 1 extra refill to pharmacy   followup - per Dr Halford Chessman - or in next 4-12 weeks whicheve is earlier

## 2017-08-07 NOTE — Progress Notes (Signed)
Subjective:     Patient ID: Sonia [name], female   DOB: 1940/03/18, 77 y.o.   MRN: [id number]   PCP [name], MD  HPI   Acute OV 08/07/2017  Chief Complaint  Patient presents with  . Acute Visit    Pt states she has been having problems with breathing due to humid weather. SOB has been worse x2 weeks and has also had chest tightness associated.   Sonia [name] 77 y.o. female [place] [address] -patient of Dr. Halford Chessman for asthma.  Last seen in August 2018 per chart review.  She is maintained on Symbicort 2 puff 2 times daily for her severe persistent asthma.  She says this normally works well for her.  She always has difficulty and humidity as noticed by the fact that when she lived in [place] for 4 years it was the worst 4 years of her life.  In [place] she has difficulty this time of the year when the humidity goes up.  She says that when she goes out of the house she is worse than inside the house but nevertheless overall when the humidity content is worse symptoms are worse even inside the house.  Now currently because of the level of increase in humidity for the last 2 weeks she is having significant shortness of breath and chest tightness and onlooker reported subjective wheezing with increased albuterol use for the last few weeks.  No fever or chest pain or sputum production or cough.  She feels she will benefit from prednisone but at the same time she does not want either extended.  Prednisone or high-dose prednisone.  She prefers a dose less than 30 mg/day.  Of note she has large hiatal hernia which she is aware of but she says she is control this by eating small frequent meals.  Most recent chest x-ray March 2019 that I personally visualized and shows clear lung fields with a large hiatal hernia.    Pulmonary tests - from Dr Halford Chessman notes Aug 2018 PFT 04/15/08 >> FEV1 1.63 (76%), FEV1% 47, TLC 5.68(113%), DLCO 87%, no BD PFT 07/30/11 >> FEV1 1.25 (61%), FEV1% 47,  TLC 4.79 (96%), DLCO 94%, no BD  Cardiac tests Echo 07/16/16 >> EF 60 to 65%, grade 2 DD, mild MR, mild TR, PAS 37 mmHg  Past medical history CAD s/p DES, HLD, Vertigo, GERD, HH, Melanoma, Hypothyroidism    has a past medical history of Arthritis, Asthma, CAD S/P percutaneous coronary angioplasty (07/15/2016), COPD (chronic obstructive pulmonary disease) ([place]), GERD (gastroesophageal reflux disease), GI bleed (07/25/2016), Hashimoto's thyroiditis, Headache, Hiatal hernia, Hypothyroidism, Pneumonia, 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   reports that she has never smoked. She has never used smokeless tobacco.  Past Surgical History:  Procedure Laterality Date  . ABDOMINAL HYSTERECTOMY     partial  . APPENDECTOMY    . BREAST CYST EXCISION Left   . BREAST LUMPECTOMY WITH RADIOACTIVE SEED LOCALIZATION Left 04/07/2015   Procedure: BREAST LUMPECTOMY WITH RADIOACTIVE SEED LOCALIZATION;  Surgeon: Erroll Luna, MD;  Location: [facility];  Service: General;  Laterality: Left;  . CORONARY STENT INTERVENTION N/A 07/15/2016   Procedure: Coronary Stent Intervention;  Surgeon: Sherren Mocha, MD;  Location: [place] [facility];  Service: Cardiovascular;  Laterality: N/A;  . ESOPHAGOGASTRODUODENOSCOPY (EGD) WITH PROPOFOL Left 07/25/2016   Procedure: ESOPHAGOGASTRODUODENOSCOPY (EGD) WITH PROPOFOL;  Surgeon: Ronnette Juniper, MD;  Location: [facility];  Service: Gastroenterology;  Laterality: Left;  . KNEE  ARTHROSCOPY Right 12/07/2003  . LAPAROSCOPIC SALPINGO OOPHERECTOMY Right 09/08/2003  . LEFT HEART CATH N/A 07/15/2016   Procedure: Left Heart Cath;  Surgeon: Sherren Mocha, MD;  Location: [place] [facility];  Service: Cardiovascular;  Laterality: N/A;  . LYSIS OF ADHESION  09/08/2003  . NASAL SINUS SURGERY  age 61  . RECTAL POLYPECTOMY  10/02/2006    Allergies  Allergen Reactions  . Adenosine Shortness Of Breath  . Contrast Media [Iodinated Diagnostic  Agents] Anaphylaxis  . Other Anaphylaxis  . Codeine Sulfate Nausea And Vomiting  . Latex Other (See Comments)    Skin irritation  . Adhesive [Tape] Other (See Comments)    SKIN IRRITATION    Immunization History  Administered Date(s) Administered  . Influenza Split 12/11/2010, 01/20/2013, 12/23/2014  . Influenza Whole 01/10/2009  . Influenza, High Dose Seasonal PF 01/29/2017  . Influenza,inj,Quad PF,6+ Mos 12/11/2015  . Pneumococcal Conjugate-13 03/12/2014  . Pneumococcal Polysaccharide-23 12/14/2002    Family History  Problem Relation Age of Onset  . Asthma Mother   . Heart disease Mother   . Asthma Sister   . Heart disease Sister   . Breast cancer Sister 73  . Heart disease Father   . Kidney disease Father   . Prostate cancer Brother        dx. 29s  . Heart attack Brother 51  . Alzheimer's disease Brother 40  . Breast cancer Maternal Aunt 70  . Stomach cancer Maternal Grandfather 60  . Asthma Sister   . Memory loss Sister   . Heart attack Son 34  . Spinal muscular atrophy Grandchild        type II  . Cancer Other 84       niece dx. ca of salivary gland   . Breast cancer Other 9       niece; negative genetic testing 10 years ago  . Melanoma Other   . Alzheimer's disease Maternal Aunt   . Cervical cancer Cousin        maternal 1st cousin dx. at 68  . Breast cancer Cousin 67       maternal 1st cousin  . Lung cancer Cousin 58       maternal 1st cousin; smoker  . Heart disease Cousin   . Leukemia Cousin 14       paternal 1st cousin  . Breast cancer Other        paternal great grandmother (PGF's mother) dx. late 45s-early 67s  . Coronary artery disease Unknown      Current Outpatient Medications:  .  albuterol (PROAIR HFA) 108 (90 Base) MCG/ACT inhaler, Inhale 2 puffs into the lungs every 6 (six) hours as needed for wheezing or shortness of breath., Disp: 3 Inhaler, Rfl: 3 .  aspirin EC 81 MG EC tablet, Take 1 tablet (81 mg total) by mouth daily., Disp: ,  Rfl:  .  budesonide-formoterol (SYMBICORT) 160-4.5 MCG/ACT inhaler, Inhale 2 puffs into the lungs 2 (two) times daily., Disp: 3 Inhaler, Rfl: 3 .  clopidogrel (PLAVIX) 75 MG tablet, Take 1 tablet (75 mg total) by mouth daily., Disp: 30 tablet, Rfl: 11 .  levothyroxine (SYNTHROID, LEVOTHROID) 137 MCG tablet, Take 137 mcg by mouth daily before breakfast., Disp: , Rfl:  .  rosuvastatin (CRESTOR) 10 MG tablet, TAKE 1 TABLET BY MOUTH THREE TIMES A WEEK, Disp: 90 tablet, Rfl: 3 .  albuterol (PROVENTIL) (2.5 MG/3ML) 0.083% nebulizer solution, Take 3 mLs (2.5 mg total) by nebulization every 6 (six) hours as  needed for wheezing or shortness of breath. (Patient not taking: Reported on 08/07/2017), Disp: 75 mL, Rfl: 0 .  nitroGLYCERIN (NITROSTAT) 0.4 MG SL tablet, DISSOLVE ONE TABLET UNDER THE TONGUE EVERY 5 MINUTES AS NEEDED FOR CHEST PAIN. (CHEST PAIN OR SHORTNESS OF BREATH) (Patient not taking: Reported on 08/07/2017), Disp: 25 tablet, Rfl: 8   Review of Systems     Objective:   Physical Exam Blood pressure 114/78, pulse 90, height 5\' 4"  (1.626 m), weight 149 lb 9.6 oz (67.9 kg), SpO2 99 %. Estimated body mass index is 25.68 kg/m as calculated from the following:   Height as of this encounter: 5\' 4"  (1.626 m).   Weight as of this encounter: 149 lb 9.6 oz (67.9 kg).     Assessment:       ICD-10-CM   1. Persistent asthma with acute exacerbation, unspecified asthma severity J45.901        Plan:      Flare up due to humidity  Plan - you are doing all the right things to avoid humidity  - continue symbicort scheduled  - do prednisone taper for 6 days as follows  -Prednisone 20mg  daily x 2 days, then 10mg  daily x 2 days, then 5mg  daily x 2 days and stop   - we are doing lower dose based on shared decision making    - send 1 extra refill to pharmacy   followup - per Dr Halford Chessman - or in next 4-12 weeks whicheve is earlier     Dr. Brand Males, M.D., [facility].C.P Pulmonary and Critical Care  Medicine Staff Physician, [place] Director - Interstitial Lung Disease  Program  P[facility] [place] [facility] [place], [place], [address]  Pager: [phone], If no answer or between  15:00h - 7:00h: call [phone] Telephone: [phone]

## 2017-08-09 ENCOUNTER — Telehealth: Payer: Self-pay | Admitting: Internal Medicine

## 2017-08-09 NOTE — Telephone Encounter (Signed)
I gave he prednisone for chest theaviness, dyspnea and wheeze. How is that? The symptios she is reporting are different. The seem ENT based/ Please confrm. Also, is she having colored drainage?

## 2017-08-09 NOTE — Telephone Encounter (Signed)
Called and spoke to patient. Patient stated that she saw MR on the 29th and was given Prednisone. Patient reports prednisone is not helping symptoms. Symptoms are sinus pain and congestion, right facial pain/ear pain and headache.   MR please advise. Thanks!

## 2017-08-09 NOTE — Telephone Encounter (Signed)
The facial stuff seems a PCP [name], MD or ENT issue. I do not know. I was wondering if drainage is coloerd I can give antibiotic or If she is keen I can do Take doxycycline 100mg  po twice daily x 5 days; take after meals and avoid sunlight - her choice    Allergies  Allergen Reactions  . Adenosine Shortness Of Breath  . Contrast Media [Iodinated Diagnostic Agents] Anaphylaxis  . Other Anaphylaxis  . Codeine Sulfate Nausea And Vomiting  . Latex Other (See Comments)    Skin irritation  . Adhesive [Tape] Other (See Comments)    SKIN IRRITATION

## 2017-08-09 NOTE — Telephone Encounter (Signed)
Spoke with the pt  She states that her symptoms of chest heaviness and wheezing are some better  She gets worse when she stays outside, so she has tried to stay in  She states she is having a lot of PND that is clear in color  Nasal rinse helped some this morning, so she plans to repeat again later today  She states her teeth and face are painful  Please advise if any additional recs for her, thanks

## 2017-08-09 NOTE — Telephone Encounter (Signed)
Spoke with patient. She stated that she does not have an ENT doctor but would be interested in Dr. Juanetta Gosling recommendations. She is not too happy with MR's recommendations. She does not like to take antibiotics unless she needs to.  She also wants me to forward her symptoms to VS, she is ok with waiting for his response on Monday.   VS, please advise. Thanks!

## 2017-08-12 NOTE — Telephone Encounter (Signed)
She can try using nasal irrigation daily, flonase 1 spray each nostril daily, and OTC antihistamine (such as claritin 10 mg) daily until symptoms better.  If her sinus symptoms don't improve, then she should call back for ENT referral.

## 2017-08-12 NOTE — Telephone Encounter (Signed)
Attempted to call patient, no answer, message left to call back.  

## 2017-08-12 NOTE — Telephone Encounter (Signed)
Attempted to call patient, no answer, unable to leave message. Will call back.

## 2017-08-13 NOTE — Telephone Encounter (Signed)
Pt is aware of below recommendations and voiced her understanding. Nothing further is needed.  

## 2017-08-21 DIAGNOSIS — E785 Hyperlipidemia, unspecified: Secondary | ICD-10-CM | POA: Diagnosis not present

## 2017-08-21 DIAGNOSIS — R7301 Impaired fasting glucose: Secondary | ICD-10-CM | POA: Diagnosis not present

## 2017-08-21 DIAGNOSIS — G629 Polyneuropathy, unspecified: Secondary | ICD-10-CM | POA: Diagnosis not present

## 2017-08-28 ENCOUNTER — Ambulatory Visit: Payer: Medicare Other | Admitting: Pulmonary Disease

## 2017-09-07 ENCOUNTER — Encounter

## 2017-09-16 ENCOUNTER — Encounter: Payer: Self-pay | Admitting: Pulmonary Disease

## 2017-09-16 ENCOUNTER — Ambulatory Visit (INDEPENDENT_AMBULATORY_CARE_PROVIDER_SITE_OTHER): Payer: Medicare Other | Admitting: Pulmonary Disease

## 2017-09-16 VITALS — BP 124/64 | HR 93 | Ht 64.0 in | Wt 152.8 lb

## 2017-09-16 DIAGNOSIS — J449 Chronic obstructive pulmonary disease, unspecified: Secondary | ICD-10-CM | POA: Diagnosis not present

## 2017-09-16 DIAGNOSIS — Z9861 Coronary angioplasty status: Secondary | ICD-10-CM

## 2017-09-16 DIAGNOSIS — I2510 Atherosclerotic heart disease of native coronary artery without angina pectoris: Secondary | ICD-10-CM

## 2017-09-16 MED ORDER — BUDESONIDE-FORMOTEROL FUMARATE 160-4.5 MCG/ACT IN AERO: 2.0000 | INHALATION_SPRAY | Freq: Two times a day (BID) | RESPIRATORY_TRACT | 0 refills | Status: DC

## 2017-09-16 NOTE — Progress Notes (Signed)
[facility]  Chief Complaint  Patient presents with  . Follow-up    Pt has increase of SOB with exertion, wheezing, and some chest tightness. Pt has more difficult raining outdoors, muggy and high humidity. Pt is requesting a handicap sticker.    Constitutional: BP 124/64 (BP Location: Left Arm, Cuff Size: Normal)   Pulse 93   Ht 5\' 4"  (1.626 m)   Wt 152 lb 12.8 oz (69.3 kg)   SpO2 99%   BMI 26.23 kg/m   History of Present Illness: Sonia [name] is a 77 y.o. female with COPD/asthma and allergic rhinitis.  She had a couple flares over the past 6 months.  Better now except when she goes out in hot, humid weather.  Has trouble walking more than 200 ft then.  Not having cough, wheeze, sputum, fever, hemoptysis, or chest pain.  Gets fatigued easily.  Has to eat small meals, or gets too full and bloated.  CXR from 06/07/17 showed hiatal hernia.   Comprehensive Respiratory Exam:  Appearance - well kempt  ENMT - nasal mucosa moist, turbinates clear, midline nasal septum, no dental lesions, no gingival bleeding, no oral exudates, no tonsillar hypertrophy Neck - no masses, trachea midline, no thyromegaly, no elevation in JVP Respiratory - normal appearance of chest wall, normal respiratory effort w/o accessory muscle use, no dullness on percussion, no wheezing or rales CV - s1s2 regular rate and rhythm, no murmurs, no peripheral edema, radial pulses symmetric GI - soft, non tender, no masses, no hepatosplenomegaly Lymph - no adenopathy noted in neck and axillary areas MSK - normal muscle strength and tone, normal gait Ext - no cyanosis, clubbing, or joint inflammation noted Skin - no rashes, lesions, or ulcers Neuro - oriented to person, place, and time Psych - normal mood and affect   Assessment/Plan:  COPD with asthma. - she has some progression in her symptoms - discussed different options on how to proceed - she would like to continue  symbicort and prn albuterol for now - she has sample of trelegy and will try this when she is ready - discussed option of repeat PFT >> she would like to defer for now - completed handicap parking form   Patient Instructions  Follow up in 6 months    Chesley Mires, MD [place] 09/16/2017, 12:38 PM  Flow Sheet  Pulmonary tests: PFT 04/15/08 >> FEV1 1.63 (76%), FEV1% 47, TLC 5.68(113%), DLCO 87%, no BD PFT 07/30/11 >> FEV1 1.25 (61%), FEV1% 47, TLC 4.79 (96%), DLCO 94%, no BD  Cardiac tests Echo 07/16/16 >> EF 60 to 65%, grade 2 DD, mild MR, mild TR, PAS 37 mmHg  Past Medical History: She  has a past medical history of Arthritis, Asthma, CAD S/P percutaneous coronary angioplasty (07/15/2016), COPD (chronic obstructive pulmonary disease) ([place]), GERD (gastroesophageal reflux disease), GI bleed (07/25/2016), Hashimoto's thyroiditis, Headache, Hiatal hernia, Hypothyroidism, Pneumonia, 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  Past Surgical History: She  has a past surgical history that includes Appendectomy; Abdominal hysterectomy; Knee arthroscopy (Right, 12/07/2003); Nasal sinus surgery (age 87); Rectal polypectomy (10/02/2006); Laparoscopic salpingo oophorectomy (Right, 09/08/2003); Lysis of adhesion (09/08/2003); Breast cyst excision (Left); Breast lumpectomy with radioactive seed localization (Left, 04/07/2015); Left Heart Cath (N/A, 07/15/2016); CORONARY STENT INTERVENTION (N/A, 07/15/2016); and Esophagogastroduodenoscopy (egd) with propofol (Left, 07/25/2016).  Family History: Her family history includes Alzheimer's disease in her maternal aunt; Alzheimer's disease (age of onset: 26) in her brother; Asthma in  her mother, sister, and sister; Breast cancer in her other; Breast cancer (age of onset: 28) in her other; Breast cancer (age of onset: 53) in her sister; Breast cancer (age of onset: 12) in her maternal aunt; Breast cancer (age of onset:  84) in her cousin; Cancer (age of onset: 20) in her other; Cervical cancer in her cousin; Coronary artery disease in her unknown relative; Heart attack (age of onset: 57) in her brother; Heart attack (age of onset: 47) in her son; Heart disease in her cousin, father, mother, and sister; Kidney disease in her father; Leukemia (age of onset: 21) in her cousin; Lung cancer (age of onset: 66) in her cousin; Melanoma in her other; Memory loss in her sister; Prostate cancer in her brother; Spinal muscular atrophy in her grandchild; Stomach cancer (age of onset: 48) in her maternal grandfather.  Social History: She  reports that she has never smoked. She has never used smokeless tobacco. She reports that she drinks alcohol. She reports that she does not use drugs.  Medications: Allergies as of 09/16/2017      Reactions   Adenosine Shortness Of Breath   Contrast Media [iodinated Diagnostic Agents] Anaphylaxis   Other Anaphylaxis   Codeine Sulfate Nausea And Vomiting   Latex Other (See Comments)   Skin irritation   Adhesive [tape] Other (See Comments)   SKIN IRRITATION      Medication List        Accurate as of 09/16/17 12:38 PM. Always use your most recent med list.          albuterol (2.5 MG/3ML) 0.083% nebulizer solution Commonly known as:  PROVENTIL Take 3 mLs (2.5 mg total) by nebulization every 6 (six) hours as needed for wheezing or shortness of breath.   albuterol 108 (90 Base) MCG/ACT inhaler Commonly known as:  PROAIR HFA Inhale 2 puffs into the lungs every 6 (six) hours as needed for wheezing or shortness of breath.   aspirin 81 MG EC tablet Take 1 tablet (81 mg total) by mouth daily.   budesonide-formoterol 160-4.5 MCG/ACT inhaler Commonly known as:  SYMBICORT Inhale 2 puffs into the lungs 2 (two) times daily.   budesonide-formoterol 160-4.5 MCG/ACT inhaler Commonly known as:  SYMBICORT Inhale 2 puffs into the lungs 2 (two) times daily.   budesonide-formoterol 160-4.5  MCG/ACT inhaler Commonly known as:  SYMBICORT Inhale 2 puffs into the lungs 2 (two) times daily.   clopidogrel 75 MG tablet Commonly known as:  PLAVIX Take 1 tablet (75 mg total) by mouth daily.   levothyroxine 137 MCG tablet Commonly known as:  SYNTHROID, LEVOTHROID Take 137 mcg by mouth daily before breakfast.   nitroGLYCERIN 0.4 MG SL tablet Commonly known as:  NITROSTAT DISSOLVE ONE TABLET UNDER THE TONGUE EVERY 5 MINUTES AS NEEDED FOR CHEST PAIN. (CHEST PAIN OR SHORTNESS OF BREATH)   predniSONE 10 MG tablet Commonly known as:  DELTASONE Take 20mg x2days,10mg x2days,5mg x2days,then stop   rosuvastatin 10 MG tablet Commonly known as:  CRESTOR TAKE 1 TABLET BY MOUTH THREE TIMES A WEEK

## 2017-09-16 NOTE — Patient Instructions (Signed)
Follow up in 6 months 

## 2017-09-17 DIAGNOSIS — M76811 Anterior tibial syndrome, right leg: Secondary | ICD-10-CM | POA: Diagnosis not present

## 2017-09-20 DIAGNOSIS — M76811 Anterior tibial syndrome, right leg: Secondary | ICD-10-CM | POA: Diagnosis not present

## 2017-09-22 ENCOUNTER — Emergency Department (HOSPITAL_COMMUNITY): Payer: Medicare Other

## 2017-09-22 ENCOUNTER — Other Ambulatory Visit: Payer: Self-pay

## 2017-09-22 ENCOUNTER — Emergency Department (HOSPITAL_COMMUNITY)
Admission: EM | Admit: 2017-09-22 | Discharge: 2017-09-22 | Disposition: A | Discharge status: Home / Self Care | Payer: Medicare Other | Attending: Emergency Medicine | Admitting: Emergency Medicine

## 2017-09-22 ENCOUNTER — Encounter (HOSPITAL_COMMUNITY): Payer: Self-pay | Admitting: Emergency Medicine

## 2017-09-22 DIAGNOSIS — R51 Headache: Secondary | ICD-10-CM | POA: Diagnosis not present

## 2017-09-22 DIAGNOSIS — D329 Benign neoplasm of meninges, unspecified: Secondary | ICD-10-CM

## 2017-09-22 DIAGNOSIS — E039 Hypothyroidism, unspecified: Secondary | ICD-10-CM | POA: Insufficient documentation

## 2017-09-22 DIAGNOSIS — J449 Chronic obstructive pulmonary disease, unspecified: Secondary | ICD-10-CM | POA: Diagnosis not present

## 2017-09-22 DIAGNOSIS — Z7982 Long term (current) use of aspirin: Secondary | ICD-10-CM | POA: Diagnosis not present

## 2017-09-22 DIAGNOSIS — R270 Ataxia, unspecified: Secondary | ICD-10-CM | POA: Diagnosis not present

## 2017-09-22 DIAGNOSIS — Z79899 Other long term (current) drug therapy: Secondary | ICD-10-CM | POA: Diagnosis not present

## 2017-09-22 DIAGNOSIS — R42 Dizziness and giddiness: Secondary | ICD-10-CM | POA: Diagnosis not present

## 2017-09-22 DIAGNOSIS — Z91040 Latex allergy status: Secondary | ICD-10-CM | POA: Diagnosis not present

## 2017-09-22 DIAGNOSIS — I2510 Atherosclerotic heart disease of native coronary artery without angina pectoris: Secondary | ICD-10-CM | POA: Insufficient documentation

## 2017-09-22 DIAGNOSIS — Z955 Presence of coronary angioplasty implant and graft: Secondary | ICD-10-CM | POA: Diagnosis not present

## 2017-09-22 DIAGNOSIS — D320 Benign neoplasm of cerebral meninges: Secondary | ICD-10-CM | POA: Diagnosis not present

## 2017-09-22 LAB — BASIC METABOLIC PANEL
ANION GAP: 7 (ref 5–15)
BUN: 14 mg/dL (ref 8–23)
CO2: 24 mmol/L (ref 22–32)
Calcium: 8.4 mg/dL — ABNORMAL LOW (ref 8.9–10.3)
Chloride: 111 mmol/L (ref 98–111)
Creatinine, Ser: 0.94 mg/dL (ref 0.44–1.00)
GFR calc non Af Amer: 57 mL/min — ABNORMAL LOW (ref >60)
Glucose, Bld: 126 mg/dL — ABNORMAL HIGH (ref 70–99)
POTASSIUM: 4 mmol/L (ref 3.5–5.1)
SODIUM: 142 mmol/L (ref 135–145)

## 2017-09-22 LAB — I-STAT CHEM 8, ED
BUN: 16 mg/dL (ref 8–23)
CHLORIDE: 109 mmol/L (ref 98–111)
Calcium, Ion: 1.14 mmol/L — ABNORMAL LOW (ref 1.15–1.40)
Creatinine, Ser: 0.9 mg/dL (ref 0.44–1.00)
GLUCOSE: 116 mg/dL — AB (ref 70–99)
HEMATOCRIT: 32 % — AB (ref 36.0–46.0)
HEMOGLOBIN: 10.9 g/dL — AB (ref 12.0–15.0)
POTASSIUM: 3.9 mmol/L (ref 3.5–5.1)
Sodium: 143 mmol/L (ref 135–145)
TCO2: 25 mmol/L (ref 22–32)

## 2017-09-22 LAB — DIFFERENTIAL
Abs Immature Granulocytes: 0 10*3/uL (ref 0.0–0.1)
Basophils Absolute: 0.1 10*3/uL (ref 0.0–0.1)
Basophils Relative: 1 %
EOS PCT: 4 %
Eosinophils Absolute: 0.2 10*3/uL (ref 0.0–0.7)
Immature Granulocytes: 0 %
LYMPHS ABS: 1.9 10*3/uL (ref 0.7–4.0)
LYMPHS PCT: 29 %
MONO ABS: 0.5 10*3/uL (ref 0.1–1.0)
Monocytes Relative: 7 %
NEUTROS ABS: 4 10*3/uL (ref 1.7–7.7)
Neutrophils Relative %: 59 %

## 2017-09-22 LAB — URINALYSIS, ROUTINE W REFLEX MICROSCOPIC
Bilirubin Urine: NEGATIVE
Glucose, UA: NEGATIVE mg/dL
Hgb urine dipstick: NEGATIVE
KETONES UR: NEGATIVE mg/dL
NITRITE: NEGATIVE
PH: 7.5 (ref 5.0–8.0)
Protein, ur: NEGATIVE mg/dL
Specific Gravity, Urine: 1.01 (ref 1.005–1.030)

## 2017-09-22 LAB — URINALYSIS, MICROSCOPIC (REFLEX)
Bacteria, UA: NONE SEEN
RBC / HPF: NONE SEEN RBC/hpf (ref 0–5)
Squamous Epithelial / LPF: NONE SEEN (ref 0–5)

## 2017-09-22 LAB — I-STAT TROPONIN, ED: Troponin i, poc: 0 ng/mL (ref 0.00–0.08)

## 2017-09-22 LAB — APTT: aPTT: 27 seconds (ref 24–36)

## 2017-09-22 LAB — PROTIME-INR
INR: 0.98
PROTHROMBIN TIME: 12.9 s (ref 11.4–15.2)

## 2017-09-22 LAB — CBC
HEMATOCRIT: 33.4 % — AB (ref 36.0–46.0)
HEMOGLOBIN: 10.2 g/dL — AB (ref 12.0–15.0)
MCH: 26 pg (ref 26.0–34.0)
MCHC: 30.5 g/dL (ref 30.0–36.0)
MCV: 85 fL (ref 78.0–100.0)
Platelets: 171 10*3/uL (ref 150–400)
RBC: 3.93 MIL/uL (ref 3.87–5.11)
RDW: 16.4 % — AB (ref 11.5–15.5)
WBC: 6.8 10*3/uL (ref 4.0–10.5)

## 2017-09-22 MED ORDER — ONDANSETRON HCL 4 MG PO TABS: 4.0000 mg | ORAL_TABLET | Freq: Four times a day (QID) | ORAL | 0 refills | Status: DC

## 2017-09-22 MED ORDER — MECLIZINE HCL 25 MG PO TABS: 25.0000 mg | ORAL_TABLET | Freq: Three times a day (TID) | ORAL | 0 refills | Status: DC | PRN

## 2017-09-22 MED ORDER — ACETAMINOPHEN 325 MG PO TABS
650.0000 mg | ORAL_TABLET | Freq: Once | ORAL | Status: AC
  Administered 2017-09-22: 650 mg via ORAL
  Filled 2017-09-22: qty 2

## 2017-09-22 MED ORDER — ONDANSETRON 4 MG PO TBDP
4.0000 mg | ORAL_TABLET | Freq: Once | ORAL | Status: AC
  Administered 2017-09-22: 4 mg via ORAL
  Filled 2017-09-22: qty 1

## 2017-09-22 MED ORDER — MECLIZINE HCL 25 MG PO TABS
25.0000 mg | ORAL_TABLET | Freq: Once | ORAL | Status: AC
  Administered 2017-09-22: 25 mg via ORAL
  Filled 2017-09-22: qty 1

## 2017-09-22 NOTE — ED Provider Notes (Signed)
[facility] EMERGENCY DEPARTMENT Provider Note   CSN: [id number] Arrival date & time: 09/22/17  0805     History   Chief Complaint Chief Complaint  Patient presents with  . Dizziness    HPI Sonia [name] is a 77 y.o. female.  HPI Patient presents to the emergency room for evaluation of dizziness, nausea and headache.  Patient states when she woke up this morning and walked to the bathroom she felt very dizzy and unsteady.  Patient did not notice any room spinning sensation however when she tried to walk she felt like she had to hold onto the wall.  Patient also has a headache in the back of her head.  She also felt nauseated when she had the dizziness.  She states she had some chest pressure this morning but this is typical for her and she has not taken her medications yet.  She denies any fevers or chills.  No cough.  No trouble with her vision.  No trouble with her speech.  No focal numbness or weakness.  The patient had a CT scan performed before my examination.  Patient states when she had to lie down for the CT scanner she felt extremely dizzy and the room was spinning.  She feels better sitting up Past Medical History:  Diagnosis Date  . Arthritis   . Asthma    daily and prn inhalers; states good control currently  . CAD S/P percutaneous coronary angioplasty 07/15/2016  . COPD (chronic obstructive pulmonary disease) ([place])   . GERD (gastroesophageal reflux disease)    no current med.  . GI bleed 07/25/2016   Brilinta changed to Plavix  . Hashimoto's thyroiditis   . Headache    allergies; silent migraines  . Hiatal hernia   . Hypothyroidism   . Pneumonia   . 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 Vitamin D deficiency     Patient Active Problem List   Diagnosis Date Noted  . GI bleed 07/25/2016  . Melena 07/24/2016  . Acute renal insufficiency 07/24/2016  . CAD S/P percutaneous coronary angioplasty 07/16/2016  . NSTEMI (non-ST elevated  myocardial infarction) ([place]) 07/13/2016  . Severe asthma with acute exacerbation 03/07/2016  . Genetic testing 06/09/2015  . Family history of breast cancer in female 05/10/2015  . History of melanoma 05/10/2015  . Atypical lobular hyperplasia of left breast 04/26/2015  . Vision problem 01/08/2014  . Hiatus hernia syndrome 08/01/2012  . Palpitations 10/18/2010  . Acute chest pain 10/18/2010  . Hypothyroidism 10/18/2010  . Dyslipidemia 06/07/2009  . Upper airway cough syndrome 03/29/2009  . COPD with asthma ([place]) 12/05/2006    Past Surgical History:  Procedure Laterality Date  . ABDOMINAL HYSTERECTOMY     partial  . APPENDECTOMY    . BREAST CYST EXCISION Left   . BREAST LUMPECTOMY WITH RADIOACTIVE SEED LOCALIZATION Left 04/07/2015   Procedure: BREAST LUMPECTOMY WITH RADIOACTIVE SEED LOCALIZATION;  Surgeon: Erroll Luna, MD;  Location: [facility];  Service: General;  Laterality: Left;  . CORONARY STENT INTERVENTION N/A 07/15/2016   Procedure: Coronary Stent Intervention;  Surgeon: Sherren Mocha, MD;  Location: [facility];  Service: Cardiovascular;  Laterality: N/A;  . ESOPHAGOGASTRODUODENOSCOPY (EGD) WITH PROPOFOL Left 07/25/2016   Procedure: ESOPHAGOGASTRODUODENOSCOPY (EGD) WITH PROPOFOL;  Surgeon: Ronnette Juniper, MD;  Location: [facility];  Service: Gastroenterology;  Laterality: Left;  . KNEE ARTHROSCOPY Right 12/07/2003  . LAPAROSCOPIC SALPINGO OOPHERECTOMY Right 09/08/2003  . LEFT HEART CATH N/A 07/15/2016  Procedure: Left Heart Cath;  Surgeon: Sherren Mocha, MD;  Location: [facility];  Service: Cardiovascular;  Laterality: N/A;  . LYSIS OF ADHESION  09/08/2003  . NASAL SINUS SURGERY  age 62  . RECTAL POLYPECTOMY  10/02/2006     OB History   None      Home Medications    Prior to Admission medications   Medication Sig Start Date End Date Taking? Authorizing Provider  albuterol (PROAIR HFA) 108 (90 Base) MCG/ACT inhaler Inhale 2 puffs into  the lungs every 6 (six) hours as needed for wheezing or shortness of breath. 11/20/16  Yes Chesley Mires, MD  albuterol (PROVENTIL) (2.5 MG/3ML) 0.083% nebulizer solution Take 3 mLs (2.5 mg total) by nebulization every 6 (six) hours as needed for wheezing or shortness of breath. 07/26/16  Yes Hongalgi, Lenis Dickinson, MD  aspirin EC 81 MG EC tablet Take 1 tablet (81 mg total) by mouth daily. 07/17/16  Yes Kilroy, Luke K, PA-C  budesonide-formoterol (SYMBICORT) 160-4.5 MCG/ACT inhaler Inhale 2 puffs into the lungs 2 (two) times daily. 11/20/16  Yes Chesley Mires, MD  budesonide-formoterol (SYMBICORT) 160-4.5 MCG/ACT inhaler Inhale 2 puffs into the lungs 2 (two) times daily. 09/16/17  Yes Chesley Mires, MD  clopidogrel (PLAVIX) 75 MG tablet Take 1 tablet (75 mg total) by mouth daily. 10/24/16  Yes Burnell Blanks, MD  levothyroxine (SYNTHROID, LEVOTHROID) 137 MCG tablet Take 137 mcg by mouth daily before breakfast.   Yes [provider]  nitroGLYCERIN (NITROSTAT) 0.4 MG SL tablet DISSOLVE ONE TABLET UNDER THE TONGUE EVERY 5 MINUTES AS NEEDED FOR CHEST PAIN. (CHEST PAIN OR SHORTNESS OF BREATH) 07/19/17  Yes Kilroy, Luke K, PA-C  rosuvastatin (CRESTOR) 10 MG tablet TAKE 1 TABLET BY MOUTH THREE TIMES A WEEK Patient taking differently: Take 5 mg by mouth every 14 (fourteen) days.  05/21/17  Yes Imogene Burn, PA-C  budesonide-formoterol (SYMBICORT) 160-4.5 MCG/ACT inhaler Inhale 2 puffs into the lungs 2 (two) times daily. Patient not taking: Reported on 09/22/2017 08/07/17   Brand Males, MD  meclizine (ANTIVERT) 25 MG tablet Take 1 tablet (25 mg total) by mouth 3 (three) times daily as needed for dizziness. 09/22/17   Dorie Rank, MD  ondansetron (ZOFRAN) 4 MG tablet Take 1 tablet (4 mg total) by mouth every 6 (six) hours. 09/22/17   Dorie Rank, MD  predniSONE (DELTASONE) 10 MG tablet Take 20mg x2days,10mg x2days,5mg x2days,then stop Patient not taking: Reported on 09/22/2017 08/07/17   Brand Males, MD     Family History Family History  Problem Relation Age of Onset  . Asthma Mother   . Heart disease Mother   . Asthma Sister   . Heart disease Sister   . Breast cancer Sister 24  . Heart disease Father   . Kidney disease Father   . Prostate cancer Brother        dx. 38s  . Heart attack Brother 54  . Alzheimer's disease Brother 64  . Breast cancer Maternal Aunt 70  . Stomach cancer Maternal Grandfather 60  . Asthma Sister   . Memory loss Sister   . Heart attack Son 5  . Spinal muscular atrophy Grandchild        type II  . Cancer Other 40       niece dx. ca of salivary gland   . Breast cancer Other 47       niece; negative genetic testing 10 years ago  . Melanoma Other   . Alzheimer's disease Maternal Aunt   .  Cervical cancer Cousin        maternal 1st cousin dx. at 15  . Breast cancer Cousin 38       maternal 1st cousin  . Lung cancer Cousin 37       maternal 1st cousin; smoker  . Heart disease Cousin   . Leukemia Cousin 14       paternal 1st cousin  . Breast cancer Other        paternal great grandmother (PGF's mother) dx. late 56s-early 19s  . Coronary artery disease Unknown     Social History Social History   Tobacco Use  . Smoking status: Never Smoker  . Smokeless tobacco: Never Used  Substance Use Topics  . Alcohol use: Yes    Comment: rarely  . Drug use: No     Allergies   Adenosine; Contrast media [iodinated diagnostic agents]; Other; Codeine sulfate; Latex; and Adhesive [tape]   Review of Systems Review of Systems  All other systems reviewed and are negative.    Physical Exam Updated Vital Signs BP (!) 146/63   Pulse 69   Temp (!) 97.5 F (36.4 C) (Oral)   Resp 15   SpO2 98%   Physical Exam  Constitutional: She is oriented to person, place, and time. She appears well-developed and well-nourished. No distress.  HENT:  Head: Normocephalic and atraumatic.  Right Ear: External ear normal.  Left Ear: External ear normal.   Mouth/Throat: Oropharynx is clear and moist.  Eyes: Conjunctivae are normal. Right eye exhibits no discharge. Left eye exhibits no discharge. No scleral icterus.  Neck: Neck supple. No tracheal deviation present.  Cardiovascular: Normal rate, regular rhythm and intact distal pulses.  Pulmonary/Chest: Effort normal and breath sounds normal. No stridor. No respiratory distress. She has no wheezes. She has no rales.  Abdominal: Soft. Bowel sounds are normal. She exhibits no distension. There is no tenderness. There is no rebound and no guarding.  Musculoskeletal: She exhibits no edema or tenderness.  Neurological: She is alert and oriented to person, place, and time. She has normal strength. No cranial nerve deficit (No facial droop, extraocular movements intact, tongue midline ) or sensory deficit. She exhibits normal muscle tone. She displays no seizure activity. Coordination normal.  No pronator drift bilateral upper extrem, able to hold both legs off bed for 5 seconds, sensation intact in all extremities, no visual field cuts, no left or right sided neglect, normal finger-nose exam bilaterally, no nystagmus noted   Skin: Skin is warm and dry. No rash noted.  Psychiatric: She has a normal mood and affect.  Nursing note and vitals reviewed.    ED Treatments / Results  Labs (all labs ordered are listed, but only abnormal results are displayed) Labs Reviewed  BASIC METABOLIC PANEL - Abnormal; Notable for the following components:      Result Value   Glucose, Bld 126 (*)    Calcium 8.4 (*)    GFR calc non Af Amer 57 (*)    All other components within normal limits  CBC - Abnormal; Notable for the following components:   Hemoglobin 10.2 (*)    HCT 33.4 (*)    RDW 16.4 (*)    All other components within normal limits  URINALYSIS, ROUTINE W REFLEX MICROSCOPIC - Abnormal; Notable for the following components:   Color, Urine YELLOW (*)    APPearance CLEAR (*)    Leukocytes, UA SMALL (*)     All other components within normal limits  I-STAT CHEM  8, ED - Abnormal; Notable for the following components:   Glucose, Bld 116 (*)    Calcium, Ion 1.14 (*)    Hemoglobin 10.9 (*)    HCT 32.0 (*)    All other components within normal limits  PROTIME-INR  APTT  DIFFERENTIAL  URINALYSIS, MICROSCOPIC (REFLEX)  I-STAT TROPONIN, ED  CBG MONITORING, ED  I-STAT TROPONIN, ED    EKG EKG Interpretation  Date/Time:  Sunday September 22 2017 08:10:29 EDT Ventricular Rate:  89 PR Interval:  158 QRS Duration: 80 QT Interval:  358 QTC Calculation: 435 R Axis:   87 Text Interpretation:  Normal sinus rhythm No significant change since last tracing Confirmed by Dorie Rank [id number]) on 09/22/2017 9:22:10 AM   Radiology Ct Head Wo Contrast  Result Date: 09/22/2017 CLINICAL DATA:  77 year old female with posterior headache, dizziness, diaphoresis, nausea EXAM: CT HEAD WITHOUT CONTRAST TECHNIQUE: Contiguous axial images were obtained from the base of the skull through the vertex without intravenous contrast. COMPARISON:  None. FINDINGS: Brain: No evidence of acute infarction, hemorrhage, hydrocephalus, extra-axial collection or mass lesion/mass effect. Extra-axial regions of ossification along the anterior inferior aspect of the right temporal fossa, and along the anterior aspect of the right frontal lobe consistent with benign dural ossifications or ossified benign meningiomas. Minimal adjacent mass effect without evidence of edema. Minimal cortical atrophy and changes of chronic microvascular ischemic white matter disease. Vascular: No hyperdense vessel or unexpected calcification. Trace bilateral internal carotid artery atherosclerotic calcifications. Skull: Normal. Negative for fracture or focal lesion. Sinuses/Orbits: The globes are symmetric and unremarkable. No orbital masses or abnormality. Bilateral mastoid air cells are clear. Mild mucoperiosteal thickening present in both maxillary sinuses as well  as throughout the ethmoid air cells. Other: None. IMPRESSION: 1. No acute intracranial abnormality. 2. Incidental note is made of calcified extra-axial dural-based masses overlying the right frontal lobe and anteroinferior to the right temporal lobe. 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 There is minimal underlying mass effect but no evidence of edema or other acute abnormality. 3. Mild cortical atrophy and chronic microvascular ischemic white matter changes. 4. Mild to moderate inflammatory paranasal sinus disease. Electronically Signed   By: Jacqulynn Cadet M.D.   On: 09/22/2017 09:25   Mr Brain Wo Contrast  Result Date: 09/22/2017 CLINICAL DATA:  Ataxia, suspect stroke EXAM: MRI HEAD WITHOUT CONTRAST TECHNIQUE: Multiplanar, multiecho pulse sequences of the brain and surrounding structures were obtained without intravenous contrast. COMPARISON:  CT head 09/22/2017 FINDINGS: Brain: Negative for acute infarct. Chronic microvascular ischemia in the white matter and pons bilaterally. Negative for hemorrhage Prominent dural ossification right frontal lobe as noted on CT. Small areas of dural ossification in the right anterior temporal lobe. No edema in the adjacent brain. Vascular: Normal arterial flow void Skull and upper cervical spine: Negative Sinuses/Orbits: Mild mucosal edema paranasal sinuses.  Normal orbit Other: None IMPRESSION: Negative for acute infarct. 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 Electronically Signed   By: Franchot Gallo M.D.   On: 09/22/2017 14:50    Procedures Procedures (including critical care time)  Medications Ordered in ED Medications  meclizine (ANTIVERT) tablet 25 mg (25 mg Oral Given 09/22/17 1004)  ondansetron (ZOFRAN-ODT) disintegrating tablet 4 mg (4 mg Oral Given 09/22/17 1004)  acetaminophen (TYLENOL) tablet 650 mg (650 mg  Oral Given 09/22/17 1004)     Initial Impression / Assessment and Plan / ED Course  I have reviewed the triage vital signs and the nursing notes.  Pertinent labs & imaging results that were available during my care of the patient were reviewed by me and considered in my medical decision making (see chart for details).  Clinical Course as of Sep 22 1532  Sun Sep 22, 2017  1158 Attempted to ambulate.  Still having dizziness.   [JK]  1528 MRI does not show any signs of acute ischemia   [JK]  1534 Labs reviewed.  No significant abnormalities   [JK]    Clinical Course User Index [JK] Dorie Rank, MD   Pt presented to the ED for evaluation of dizziness.  Sx suggestive of peripheral vertigo however pt had persistent symptoms.  MRI to rule out stroke which was negative.  Suspect sx related to peripheral vertigo.  Incidental mengioma is non contributory.   Stable for outpatient follow up with PCP  Final Clinical Impressions(s) / ED Diagnoses   Final diagnoses:  Vertigo  Meningioma [facility])    ED Discharge Orders        Ordered    meclizine (ANTIVERT) 25 MG tablet  3 times daily PRN     09/22/17 1528    ondansetron (ZOFRAN) 4 MG tablet  Every 6 hours     09/22/17 1528       Dorie Rank, MD 09/22/17 1534

## 2017-09-22 NOTE — ED Notes (Signed)
Patient transported to CT 

## 2017-09-22 NOTE — ED Triage Notes (Signed)
Pt reports upon waking around 0545 this am, she felt dizzy when she tried walking to the bathroom, also c/o diaphoresis, nausea  And a posterior headache. States she felt fine when she went to sleep last night. Pt a/ox4, speech clear, face symmetrical, no neuro deficits. States dizziness is much better when she is sitting.

## 2017-09-22 NOTE — Discharge Instructions (Signed)
Follow-up with your primary care doctor next week, take the medications as needed for vertigo,

## 2017-09-22 NOTE — ED Notes (Signed)
Patient transported to MRI 

## 2017-09-22 NOTE — ED Notes (Signed)
Patient given discharge instructions and verbalized understanding.  Patient stable to discharge at this time.  Patient is alert and oriented to baseline.  No distressed noted at this time.  All belongings taken with the patient at discharge.   

## 2017-09-22 NOTE — ED Notes (Signed)
Pt ambulatory with a standby assist. Gait steady and even.

## 2017-09-27 ENCOUNTER — Telehealth: Payer: Self-pay | Admitting: Pulmonary Disease

## 2017-09-27 NOTE — Telephone Encounter (Signed)
Spoke with pt. She would like for this to be mailed to her. Form has been placed in the outgoing mail. Nothing further was needed.

## 2017-09-27 NOTE — Telephone Encounter (Signed)
Okay to complete form and I can sign it.

## 2017-09-27 NOTE — Telephone Encounter (Signed)
Spoke with pt. She is needing a new handicap placard application. States that she has misplaced her other one.  Dr. Halford Chessman - please advise. Thanks!

## 2017-09-30 DIAGNOSIS — M76811 Anterior tibial syndrome, right leg: Secondary | ICD-10-CM | POA: Diagnosis not present

## 2017-10-02 DIAGNOSIS — M76819 Anterior tibial syndrome, unspecified leg: Secondary | ICD-10-CM | POA: Diagnosis not present

## 2017-10-10 DIAGNOSIS — M76819 Anterior tibial syndrome, unspecified leg: Secondary | ICD-10-CM | POA: Diagnosis not present

## 2017-10-14 DIAGNOSIS — M76819 Anterior tibial syndrome, unspecified leg: Secondary | ICD-10-CM | POA: Diagnosis not present

## 2017-10-17 DIAGNOSIS — M76819 Anterior tibial syndrome, unspecified leg: Secondary | ICD-10-CM | POA: Diagnosis not present

## 2017-10-22 DIAGNOSIS — E785 Hyperlipidemia, unspecified: Secondary | ICD-10-CM | POA: Diagnosis not present

## 2017-10-22 DIAGNOSIS — G629 Polyneuropathy, unspecified: Secondary | ICD-10-CM | POA: Diagnosis not present

## 2017-10-22 DIAGNOSIS — E039 Hypothyroidism, unspecified: Secondary | ICD-10-CM | POA: Diagnosis not present

## 2017-10-23 DIAGNOSIS — H10411 Chronic giant papillary conjunctivitis, right eye: Secondary | ICD-10-CM | POA: Diagnosis not present

## 2017-10-26 NOTE — Progress Notes (Signed)
Chief Complaint  Patient presents with  . Follow-up    CAD    History of Present Illness: 77 yo female with history of CAD, COPD, GERD and hypothyroidism who is here today for cardiac follow up. She was admitted to [facility] May 2018 with c/o left arm pain and found to have a NSTEMI. Cardiac cath May 2018 with severe stenosis in the mid RCA treated with a drug eluting stent. There was a moderate stenosis in the mid LAD. Echo May 2018 with normal LV systolic function, mild valve disease. She was seen in the hospital by Dr. Burt Knack. She was discharged on Brilinta and readmitted one week later with a GI bleed. EGD showed gastritis. She was changed from Brilinta to Plavix and bleeding resolved. Chest pain and dyspnea at times felt to be due to her asthma as it improved with her inhalers. She has only tolerated Crestor 3 days per week. She cancelled a stress test arranged in March 2019.   She is here today for follow up. The patient denies any chest pain, dyspnea, palpitations, lower extremity edema, orthopnea, PND, dizziness, near syncope or syncope.   Primary Care Physician: [name], MD  Past Medical History:  Diagnosis Date  . Arthritis   . Asthma    daily and prn inhalers; states good control currently  . CAD S/P percutaneous coronary angioplasty 07/15/2016  . COPD (chronic obstructive pulmonary disease) ([place])   . GERD (gastroesophageal reflux disease)    no current med.  . GI bleed 07/25/2016   Brilinta changed to Plavix  . Hashimoto's thyroiditis   . Headache    allergies; silent migraines  . Hiatal hernia   . Hypothyroidism   . Pneumonia   . 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 Vitamin D deficiency     Past Surgical History:  Procedure Laterality Date  . ABDOMINAL HYSTERECTOMY     partial  . APPENDECTOMY    . BREAST CYST EXCISION Left   . BREAST LUMPECTOMY WITH RADIOACTIVE SEED LOCALIZATION Left 04/07/2015   Procedure: BREAST LUMPECTOMY WITH RADIOACTIVE SEED  LOCALIZATION;  Surgeon: Erroll Luna, MD;  Location: [place];  Service: General;  Laterality: Left;  . CORONARY STENT INTERVENTION N/A 07/15/2016   Procedure: Coronary Stent Intervention;  Surgeon: Sherren Mocha, MD;  Location: [facility];  Service: Cardiovascular;  Laterality: N/A;  . ESOPHAGOGASTRODUODENOSCOPY (EGD) WITH PROPOFOL Left 07/25/2016   Procedure: ESOPHAGOGASTRODUODENOSCOPY (EGD) WITH PROPOFOL;  Surgeon: Ronnette Juniper, MD;  Location: [place];  Service: Gastroenterology;  Laterality: Left;  . KNEE ARTHROSCOPY Right 12/07/2003  . LAPAROSCOPIC SALPINGO OOPHERECTOMY Right 09/08/2003  . LEFT HEART CATH N/A 07/15/2016   Procedure: Left Heart Cath;  Surgeon: Sherren Mocha, MD;  Location: [facility];  Service: Cardiovascular;  Laterality: N/A;  . LYSIS OF ADHESION  09/08/2003  . NASAL SINUS SURGERY  age 15  . RECTAL POLYPECTOMY  10/02/2006    Current Outpatient Medications  Medication Sig Dispense Refill  . albuterol (PROAIR HFA) 108 (90 Base) MCG/ACT inhaler Inhale 2 puffs into the lungs every 6 (six) hours as needed for wheezing or shortness of breath. 3 Inhaler 3  . albuterol (PROVENTIL) (2.5 MG/3ML) 0.083% nebulizer solution Take 3 mLs (2.5 mg total) by nebulization every 6 (six) hours as needed for wheezing or shortness of breath. 75 mL 0  . aspirin EC 81 MG EC tablet Take 1 tablet (81 mg total) by mouth daily.    . budesonide-formoterol (SYMBICORT) 160-4.5  MCG/ACT inhaler Inhale 2 puffs into the lungs 2 (two) times daily. 3 Inhaler 3  . levothyroxine (SYNTHROID, LEVOTHROID) 137 MCG tablet Take 137 mcg by mouth daily before breakfast.    . meclizine (ANTIVERT) 25 MG tablet Take 1 tablet (25 mg total) by mouth 3 (three) times daily as needed for dizziness. 30 tablet 0  . nitroGLYCERIN (NITROSTAT) 0.4 MG SL tablet DISSOLVE ONE TABLET UNDER THE TONGUE EVERY 5 MINUTES AS NEEDED FOR CHEST PAIN. (CHEST PAIN OR SHORTNESS OF BREATH) 25 tablet 8   No current  facility-administered medications for this visit.     Allergies  Allergen Reactions  . Adenosine Shortness Of Breath  . Contrast Media [Iodinated Diagnostic Agents] Anaphylaxis  . Other Anaphylaxis  . Codeine Sulfate Nausea And Vomiting  . Latex Other (See Comments)    Skin irritation  . Adhesive [Tape] Other (See Comments)    SKIN IRRITATION    Social History   Socioeconomic History  . Marital status: Divorced    Spouse name: Not on file  . Number of children: 2  . Years of education: Not on file  . Highest education level: Not on file  Occupational History  . 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 Financial resource strain: Not on file  . Food insecurity:    Worry: Not on file    Inability: Not on file  . Transportation needs:    Medical: Not on file    Non-medical: Not on file  Tobacco Use  . Smoking status: Never Smoker  . Smokeless tobacco: Never Used  Substance and Sexual Activity  . Alcohol use: Yes    Comment: rarely  . Drug use: No  . Sexual activity: Not on file  Lifestyle  . Physical activity:    Days per week: Not on file    Minutes per session: Not on file  . Stress: Not on file  Relationships  . Social connections:    Talks on phone: Not on file    Gets together: Not on file    Attends religious service: Not on file    Active member of club or organization: Not on file    Attends meetings of clubs or organizations: Not on file    Relationship status: Not on file  . Intimate partner violence:    Fear of current or ex partner: Not on file    Emotionally abused: Not on file    Physically abused: Not on file    Forced sexual activity: Not on file  Other Topics Concern  . Not on file  Social History Narrative  . Not on file    Family History  Problem Relation Age of Onset  . Asthma Mother   . Heart disease Mother   . Asthma Sister   . Heart disease Sister   . Breast cancer Sister 31   . Heart disease Father   . Kidney disease Father   . Prostate cancer Brother        dx. 29s  . Heart attack Brother 102  . Alzheimer's disease Brother 75  . Breast cancer Maternal Aunt 70  . Stomach cancer Maternal Grandfather 60  . Asthma Sister   . Memory loss Sister   . Heart attack Son 61  . Spinal muscular atrophy Grandchild        type II  . Cancer Other 29       niece dx. ca of  salivary gland   . Breast cancer Other 17       niece; negative genetic testing 10 years ago  . Melanoma Other   . Alzheimer's disease Maternal Aunt   . Cervical cancer Cousin        maternal 1st cousin dx. at 51  . Breast cancer Cousin 24       maternal 1st cousin  . Lung cancer Cousin 10       maternal 1st cousin; smoker  . Heart disease Cousin   . Leukemia Cousin 14       paternal 1st cousin  . Breast cancer Other        paternal great grandmother (PGF's mother) dx. late 1s-early 72s  . Coronary artery disease Unknown     Review of Systems:  As stated in the HPI and otherwise negative.   BP 124/60   Pulse 93   Ht 5\' 4"  (1.626 m)   Wt 151 lb (68.5 kg)   SpO2 96%   BMI 25.92 kg/m   Physical Examination:  General: Well developed, well nourished, NAD  HEENT: OP clear, mucus membranes moist  SKIN: warm, dry. No rashes. Neuro: No focal deficits  Musculoskeletal: Muscle strength 5/5 all ext  Psychiatric: Mood and affect normal  Neck: No JVD, no carotid bruits, no thyromegaly, no lymphadenopathy.  Lungs:Clear bilaterally, no wheezes, rhonci, crackles Cardiovascular: Regular rate and rhythm. No murmurs, gallops or rubs. Abdomen:Soft. Bowel sounds present. Non-tender.  Extremities: No lower extremity edema. Pulses are 2 + in the bilateral DP/PT.  Echo 07/16/16: - Left ventricle: The cavity size was normal. Systolic function was   normal. The estimated ejection fraction was in the range of 60%   to 65%. Wall motion was normal; there were no regional wall   motion abnormalities.  Features are consistent with a pseudonormal   left ventricular filling pattern, with concomitant abnormal   relaxation and increased filling pressure (grade 2 diastolic   dysfunction). Doppler parameters are consistent with high   ventricular filling pressure. - Mitral valve: Calcified annulus. There was mild regurgitation. - Tricuspid valve: There was mild regurgitation. - Pulmonic valve: There was trivial regurgitation. - Pulmonary arteries: PA peak pressure: 37 mm Hg (S). Impressions: - The right ventricular systolic pressure was increased consistent   with mild pulmonary hypertension.  EKG:  EKG is not ordered today. The ekg ordered today demonstrates   Recent Labs: 07/26/2017: ALT 6 09/22/2017: BUN 16; Creatinine, Ser 0.90; Hemoglobin 10.9; Platelets 171; Potassium 3.9; Sodium 143   Lipid Panel    Component Value Date/Time   CHOL 174 07/26/2017 1001   TRIG 65 07/26/2017 1001   HDL 76 07/26/2017 1001   CHOLHDL 2.5 01/16/2017 1036   CHOLHDL 3.0 07/13/2016 2331   VLDL 12 07/13/2016 2331   LDLCALC 85 07/26/2017 1001     Wt Readings from Last 3 Encounters:  10/28/17 151 lb (68.5 kg)  09/16/17 152 lb 12.8 oz (69.3 kg)  08/07/17 149 lb 9.6 oz (67.9 kg)     Other studies Reviewed: Additional studies/ records that were reviewed today include: . Review of the above records demonstrates:   Assessment and Plan:   1. CAD without angina: No chest pain. Continue ASA and statin. No beta blocker due to fatigue and hypotension.  Will stop Plavix.   2. HLD: LDL was 85 in May 2019. Continue statin several days per week as this is all she can tolerate.   Current medicines are reviewed at length with  the patient today.  The patient does not have concerns regarding medicines.  The following changes have been made:  no change  Labs/ tests ordered today include:   No orders of the defined types were placed in this encounter.    Disposition:   FU with me in 6  months   Signed, [name], MD 10/28/2017 12:27 PM    [facility] [address], [place], [place]  [address] Phone: [phone]; Fax: [phone]

## 2017-10-28 ENCOUNTER — Encounter: Payer: Self-pay | Admitting: Cardiovascular Disease

## 2017-10-28 ENCOUNTER — Ambulatory Visit (INDEPENDENT_AMBULATORY_CARE_PROVIDER_SITE_OTHER): Payer: Medicare Other | Admitting: Cardiovascular Disease

## 2017-10-28 VITALS — BP 124/60 | HR 93 | Ht 64.0 in | Wt 151.0 lb

## 2017-10-28 DIAGNOSIS — Z9861 Coronary angioplasty status: Secondary | ICD-10-CM

## 2017-10-28 DIAGNOSIS — I2510 Atherosclerotic heart disease of native coronary artery without angina pectoris: Secondary | ICD-10-CM

## 2017-10-28 DIAGNOSIS — E785 Hyperlipidemia, unspecified: Secondary | ICD-10-CM | POA: Diagnosis not present

## 2017-10-28 NOTE — Patient Instructions (Signed)
Medication Instructions:  Your physician has recommended you make the following change in your medication:  Stop Clopidogrel.    Labwork: none  Testing/Procedures: none  Follow-Up: Your physician recommends that you schedule a follow-up appointment in: 12 months. Please call our office in about 8 months to schedule this appointment   Any Other Special Instructions Will Be Listed Below (If Applicable).     If you need a refill on your cardiac medications before your next appointment, please call your pharmacy.

## 2017-11-14 ENCOUNTER — Telehealth: Payer: Self-pay | Admitting: Cardiovascular Disease

## 2017-11-14 NOTE — Telephone Encounter (Signed)
I reviewed with Tana Coast, PharmD and pt does not need an antibiotic prior to dental appointment. I spoke with pt and gave her this information. Pt states dentist office needs this information called or faxed to them.  Pt does not have phone number or fax number available at this time.  She will contact dental office and ask them to contact us directly.

## 2017-11-14 NOTE — Telephone Encounter (Signed)
New Message   Patient is calling to request an antibiotic before she has her dentist appointment. Please call to discuss.

## 2017-11-21 DIAGNOSIS — E039 Hypothyroidism, unspecified: Secondary | ICD-10-CM | POA: Diagnosis not present

## 2017-11-21 DIAGNOSIS — R7301 Impaired fasting glucose: Secondary | ICD-10-CM | POA: Diagnosis not present

## 2017-11-21 DIAGNOSIS — E162 Hypoglycemia, unspecified: Secondary | ICD-10-CM | POA: Diagnosis not present

## 2017-11-21 DIAGNOSIS — E063 Autoimmune thyroiditis: Secondary | ICD-10-CM | POA: Diagnosis not present

## 2017-11-21 DIAGNOSIS — E785 Hyperlipidemia, unspecified: Secondary | ICD-10-CM | POA: Diagnosis not present

## 2017-11-21 DIAGNOSIS — Z139 Encounter for screening, unspecified: Secondary | ICD-10-CM | POA: Diagnosis not present

## 2017-11-21 DIAGNOSIS — M8580 Other specified disorders of bone density and structure, unspecified site: Secondary | ICD-10-CM | POA: Diagnosis not present

## 2017-11-22 ENCOUNTER — Other Ambulatory Visit: Payer: Self-pay | Admitting: Endocrinology

## 2017-11-22 DIAGNOSIS — E039 Hypothyroidism, unspecified: Secondary | ICD-10-CM

## 2017-11-22 DIAGNOSIS — M8580 Other specified disorders of bone density and structure, unspecified site: Secondary | ICD-10-CM

## 2017-11-22 DIAGNOSIS — Z1239 Encounter for other screening for malignant neoplasm of breast: Secondary | ICD-10-CM

## 2017-11-22 DIAGNOSIS — Z1231 Encounter for screening mammogram for malignant neoplasm of breast: Secondary | ICD-10-CM

## 2017-12-02 ENCOUNTER — Other Ambulatory Visit: Payer: Self-pay | Admitting: Endocrinology

## 2017-12-02 DIAGNOSIS — M8580 Other specified disorders of bone density and structure, unspecified site: Secondary | ICD-10-CM

## 2017-12-23 DIAGNOSIS — Z23 Encounter for immunization: Secondary | ICD-10-CM | POA: Diagnosis not present

## 2018-01-14 DIAGNOSIS — J449 Chronic obstructive pulmonary disease, unspecified: Secondary | ICD-10-CM | POA: Diagnosis not present

## 2018-01-14 DIAGNOSIS — M545 Low back pain: Secondary | ICD-10-CM | POA: Diagnosis not present

## 2018-02-04 DIAGNOSIS — H903 Sensorineural hearing loss, bilateral: Secondary | ICD-10-CM | POA: Diagnosis not present

## 2018-02-04 DIAGNOSIS — J310 Chronic rhinitis: Secondary | ICD-10-CM | POA: Diagnosis not present

## 2018-02-04 DIAGNOSIS — H838X3 Other specified diseases of inner ear, bilateral: Secondary | ICD-10-CM | POA: Diagnosis not present

## 2018-02-04 DIAGNOSIS — H9209 Otalgia, unspecified ear: Secondary | ICD-10-CM | POA: Diagnosis not present

## 2018-02-11 DIAGNOSIS — R198 Other specified symptoms and signs involving the digestive system and abdomen: Secondary | ICD-10-CM | POA: Diagnosis not present

## 2018-02-11 DIAGNOSIS — K449 Diaphragmatic hernia without obstruction or gangrene: Secondary | ICD-10-CM | POA: Diagnosis not present

## 2018-02-11 DIAGNOSIS — K219 Gastro-esophageal reflux disease without esophagitis: Secondary | ICD-10-CM | POA: Diagnosis not present

## 2018-02-20 ENCOUNTER — Ambulatory Visit: Payer: Medicare Other

## 2018-02-20 ENCOUNTER — Other Ambulatory Visit: Payer: Medicare Other

## 2018-02-21 DIAGNOSIS — J320 Chronic maxillary sinusitis: Secondary | ICD-10-CM | POA: Diagnosis not present

## 2018-02-24 DIAGNOSIS — H2513 Age-related nuclear cataract, bilateral: Secondary | ICD-10-CM | POA: Diagnosis not present

## 2018-03-13 ENCOUNTER — Other Ambulatory Visit: Payer: Self-pay | Admitting: Pulmonary Disease

## 2018-03-26 DIAGNOSIS — E039 Hypothyroidism, unspecified: Secondary | ICD-10-CM | POA: Diagnosis not present

## 2018-04-23 ENCOUNTER — Ambulatory Visit
Admission: RE | Admit: 2018-04-23 | Discharge: 2018-04-23 | Disposition: A | Discharge status: Home / Self Care | Payer: Medicare Other | Source: Ambulatory Visit | Attending: Endocrinology | Admitting: Endocrinology

## 2018-04-23 DIAGNOSIS — Z780 Asymptomatic menopausal state: Secondary | ICD-10-CM | POA: Diagnosis not present

## 2018-04-23 DIAGNOSIS — M8580 Other specified disorders of bone density and structure, unspecified site: Secondary | ICD-10-CM

## 2018-04-23 DIAGNOSIS — Z1231 Encounter for screening mammogram for malignant neoplasm of breast: Secondary | ICD-10-CM | POA: Diagnosis not present

## 2018-04-23 DIAGNOSIS — M85852 Other specified disorders of bone density and structure, left thigh: Secondary | ICD-10-CM | POA: Diagnosis not present

## 2018-04-24 ENCOUNTER — Other Ambulatory Visit: Payer: Self-pay | Admitting: Endocrinology

## 2018-04-24 DIAGNOSIS — R928 Other abnormal and inconclusive findings on diagnostic imaging of breast: Secondary | ICD-10-CM

## 2018-04-30 ENCOUNTER — Ambulatory Visit
Admission: RE | Admit: 2018-04-30 | Discharge: 2018-04-30 | Disposition: A | Discharge status: Home / Self Care | Payer: Medicare Other | Source: Ambulatory Visit | Attending: Endocrinology | Admitting: Endocrinology

## 2018-04-30 ENCOUNTER — Telehealth: Payer: Self-pay | Admitting: Cardiovascular Disease

## 2018-04-30 ENCOUNTER — Other Ambulatory Visit: Payer: Self-pay | Admitting: Endocrinology

## 2018-04-30 DIAGNOSIS — R928 Other abnormal and inconclusive findings on diagnostic imaging of breast: Secondary | ICD-10-CM

## 2018-04-30 DIAGNOSIS — N6313 Unspecified lump in the right breast, lower outer quadrant: Secondary | ICD-10-CM | POA: Diagnosis not present

## 2018-04-30 DIAGNOSIS — R922 Inconclusive mammogram: Secondary | ICD-10-CM | POA: Diagnosis not present

## 2018-04-30 DIAGNOSIS — N6311 Unspecified lump in the right breast, upper outer quadrant: Secondary | ICD-10-CM | POA: Diagnosis not present

## 2018-04-30 NOTE — Telephone Encounter (Signed)
New Message:    Patient would like for some one to call her back about a heart question. Patient  wouldn't tell me what she wanted. Just ask to speak with a nurse.

## 2018-04-30 NOTE — Telephone Encounter (Signed)
I spoke with pt. She is having needle biopsy of breast on Friday. She states someone told her she might want to let cardiologist know. Pt states cardiac clearance is not needed.

## 2018-04-30 NOTE — Telephone Encounter (Signed)
Thanks

## 2018-05-02 ENCOUNTER — Ambulatory Visit
Admission: RE | Admit: 2018-05-02 | Discharge: 2018-05-02 | Disposition: A | Discharge status: Home / Self Care | Payer: Medicare Other | Source: Ambulatory Visit | Attending: Endocrinology | Admitting: Endocrinology

## 2018-05-02 DIAGNOSIS — R5383 Other fatigue: Secondary | ICD-10-CM | POA: Diagnosis not present

## 2018-05-02 DIAGNOSIS — R928 Other abnormal and inconclusive findings on diagnostic imaging of breast: Secondary | ICD-10-CM

## 2018-05-02 DIAGNOSIS — C50811 Malignant neoplasm of overlapping sites of right female breast: Secondary | ICD-10-CM | POA: Diagnosis not present

## 2018-05-02 DIAGNOSIS — N6311 Unspecified lump in the right breast, upper outer quadrant: Secondary | ICD-10-CM | POA: Diagnosis not present

## 2018-05-02 DIAGNOSIS — R718 Other abnormality of red blood cells: Secondary | ICD-10-CM | POA: Diagnosis not present

## 2018-05-09 ENCOUNTER — Telehealth: Payer: Self-pay | Admitting: Cardiovascular Disease

## 2018-05-09 NOTE — Telephone Encounter (Signed)
Pt c/o Shortness Of Breath: STAT if SOB developed within the last 24 hours or pt is noticeably SOB on the phone  1. Are you currently SOB (can you hear that pt is SOB on the phone)? Yes   2. How long have you been experiencing SOB? 9 months    3. Are you SOB when sitting or when up moving around? After any kind of walking or exertion  4. Are you currently experiencing any other symptoms? nope

## 2018-05-09 NOTE — Telephone Encounter (Signed)
I spoke with pt. She reports shortness of breath with walking short distances.  This has been going on for several months.  Is taking inhalers as ordered but this has not helped shortness of breath. Hemoglobin and ferritin levels are low and iron is being started.  Pt states she was told her levels should not be causing her to be short of breath. Has not seen pulmonary for awhile but would like to see cardiology and have EKG done first. I scheduled pt to see Dr. Angelena Form on March 2,2020 at 11:40

## 2018-05-12 ENCOUNTER — Ambulatory Visit: Payer: Medicare Other | Admitting: Cardiovascular Disease

## 2018-05-12 NOTE — Progress Notes (Deleted)
No chief complaint on file.   History of Present Illness: 78 yo female with history of CAD, COPD, GERD and hypothyroidism who is here today for cardiac follow up. She was admitted to [facility] May 2018 with c/o left arm pain and found to have a NSTEMI. Cardiac cath May 2018 with severe stenosis in the mid RCA treated with a drug eluting stent. There was a moderate stenosis in the mid LAD. Echo May 2018 with normal LV systolic function, mild valve disease. She was discharged on Brilinta and readmitted one week later with a GI bleed. EGD showed gastritis. She was changed from Brilinta to Plavix and bleeding resolved. Chest pain and dyspnea at times felt to be due to her asthma as it improved with her inhalers. She has only tolerated Crestor 3 days per week. She cancelled a stress test arranged in March 2019.   She is here today for follow up. The patient denies any chest pain, palpitations, lower extremity edema, orthopnea, PND, dizziness, near syncope or syncope. *** She has been having dyspnea with exertion.   Primary Care Physician: [name], MD  Past Medical History:  Diagnosis Date  . Arthritis   . Asthma    daily and prn inhalers; states good control currently  . CAD S/P percutaneous coronary angioplasty 07/15/2016  . COPD (chronic obstructive pulmonary disease) ([place])   . GERD (gastroesophageal reflux disease)    no current med.  . GI bleed 07/25/2016   Brilinta changed to Plavix  . Hashimoto's thyroiditis   . Headache    allergies; silent migraines  . Hiatal hernia   . Hypothyroidism   . Pneumonia   . 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 Vitamin D deficiency     Past Surgical History:  Procedure Laterality Date  . ABDOMINAL HYSTERECTOMY     partial  . APPENDECTOMY    . BREAST BIOPSY Left   . BREAST CYST EXCISION Left   . BREAST EXCISIONAL BIOPSY Left   . BREAST LUMPECTOMY WITH RADIOACTIVE SEED LOCALIZATION Left 04/07/2015   Procedure: BREAST LUMPECTOMY WITH  RADIOACTIVE SEED LOCALIZATION;  Surgeon: Erroll Luna, MD;  Location: [place];  Service: General;  Laterality: Left;  . CORONARY STENT INTERVENTION N/A 07/15/2016   Procedure: Coronary Stent Intervention;  Surgeon: Sherren Mocha, MD;  Location: [facility];  Service: Cardiovascular;  Laterality: N/A;  . ESOPHAGOGASTRODUODENOSCOPY (EGD) WITH PROPOFOL Left 07/25/2016   Procedure: ESOPHAGOGASTRODUODENOSCOPY (EGD) WITH PROPOFOL;  Surgeon: Ronnette Juniper, MD;  Location: [facility];  Service: Gastroenterology;  Laterality: Left;  . KNEE ARTHROSCOPY Right 12/07/2003  . LAPAROSCOPIC SALPINGO OOPHERECTOMY Right 09/08/2003  . LEFT HEART CATH N/A 07/15/2016   Procedure: Left Heart Cath;  Surgeon: Sherren Mocha, MD;  Location: [facility];  Service: Cardiovascular;  Laterality: N/A;  . LYSIS OF ADHESION  09/08/2003  . NASAL SINUS SURGERY  age 51  . RECTAL POLYPECTOMY  10/02/2006    Current Outpatient Medications  Medication Sig Dispense Refill  . albuterol (PROVENTIL HFA;VENTOLIN HFA) 108 (90 Base) MCG/ACT inhaler INHALE 2 PUFFS INTO LUNGS EVERY 6 HOURS AS NEEDED FOR WHEEZING OR SHORTNESS OF BREATH 27 g 0  . albuterol (PROVENTIL) (2.5 MG/3ML) 0.083% nebulizer solution Take 3 mLs (2.5 mg total) by nebulization every 6 (six) hours as needed for wheezing or shortness of breath. 75 mL 0  . aspirin EC 81 MG EC tablet Take 1 tablet (81 mg total) by mouth daily.    Marland Kitchen levothyroxine (SYNTHROID,  LEVOTHROID) 137 MCG tablet Take 137 mcg by mouth daily before breakfast.    . meclizine (ANTIVERT) 25 MG tablet Take 1 tablet (25 mg total) by mouth 3 (three) times daily as needed for dizziness. 30 tablet 0  . nitroGLYCERIN (NITROSTAT) 0.4 MG SL tablet DISSOLVE ONE TABLET UNDER THE TONGUE EVERY 5 MINUTES AS NEEDED FOR CHEST PAIN. (CHEST PAIN OR SHORTNESS OF BREATH) 25 tablet 8  . SYMBICORT 160-4.5 MCG/ACT inhaler INHALE 2 PUFFS INTO THE LUNGS TWICE DAILY 33 g 0   No current  facility-administered medications for this visit.     Allergies  Allergen Reactions  . Adenosine Shortness Of Breath  . Contrast Media [Iodinated Diagnostic Agents] Anaphylaxis  . Other Anaphylaxis  . Codeine Sulfate Nausea And Vomiting  . Latex Other (See Comments)    Skin irritation  . Adhesive [Tape] Other (See Comments)    SKIN IRRITATION    Social History   Socioeconomic History  . Marital status: Divorced    Spouse name: Not on file  . Number of children: 2  . Years of education: Not on file  . Highest education level: Not on file  Occupational History  . 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 Financial resource strain: Not on file  . Food insecurity:    Worry: Not on file    Inability: Not on file  . Transportation needs:    Medical: Not on file    Non-medical: Not on file  Tobacco Use  . Smoking status: Never Smoker  . Smokeless tobacco: Never Used  Substance and Sexual Activity  . Alcohol use: Yes    Comment: rarely  . Drug use: No  . Sexual activity: Not on file  Lifestyle  . Physical activity:    Days per week: Not on file    Minutes per session: Not on file  . Stress: Not on file  Relationships  . Social connections:    Talks on phone: Not on file    Gets together: Not on file    Attends religious service: Not on file    Active member of club or organization: Not on file    Attends meetings of clubs or organizations: Not on file    Relationship status: Not on file  . Intimate partner violence:    Fear of current or ex partner: Not on file    Emotionally abused: Not on file    Physically abused: Not on file    Forced sexual activity: Not on file  Other Topics Concern  . Not on file  Social History Narrative  . Not on file    Family History  Problem Relation Age of Onset  . Asthma Mother   . Heart disease Mother   . Asthma Sister   . Heart disease Sister   . Breast cancer Sister 1   . Heart disease Father   . Kidney disease Father   . Prostate cancer Brother        dx. 52s  . Heart attack Brother 7  . Alzheimer's disease Brother 60  . Breast cancer Maternal Aunt 70  . Stomach cancer Maternal Grandfather 60  . Asthma Sister   . Memory loss Sister   . Heart attack Son 76  . Spinal muscular atrophy Grandchild        type II  . Cancer Other 17       niece dx. ca of salivary gland   .  Breast cancer Other 18       niece; negative genetic testing 10 years ago  . Melanoma Other   . Alzheimer's disease Maternal Aunt   . Cervical cancer Cousin        maternal 1st cousin dx. at 76  . Breast cancer Cousin 79       maternal 1st cousin  . Lung cancer Cousin 52       maternal 1st cousin; smoker  . Heart disease Cousin   . Leukemia Cousin 14       paternal 1st cousin  . Breast cancer Other        paternal great grandmother (PGF's mother) dx. late 53s-early 42s  . Coronary artery disease Other     Review of Systems:  As stated in the HPI and otherwise negative.   There were no vitals taken for this visit.  Physical Examination:  General: Well developed, well nourished, NAD  HEENT: OP clear, mucus membranes moist  SKIN: warm, dry. No rashes. Neuro: No focal deficits  Musculoskeletal: Muscle strength 5/5 all ext  Psychiatric: Mood and affect normal  Neck: No JVD, no carotid bruits, no thyromegaly, no lymphadenopathy.  Lungs:Clear bilaterally, no wheezes, rhonci, crackles Cardiovascular: Regular rate and rhythm. No murmurs, gallops or rubs. Abdomen:Soft. Bowel sounds present. Non-tender.  Extremities: No lower extremity edema. Pulses are 2 + in the bilateral DP/PT.  Echo 07/16/16: - Left ventricle: The cavity size was normal. Systolic function was   normal. The estimated ejection fraction was in the range of 60%   to 65%. Wall motion was normal; there were no regional wall   motion abnormalities. Features are consistent with a pseudonormal   left ventricular  filling pattern, with concomitant abnormal   relaxation and increased filling pressure (grade 2 diastolic   dysfunction). Doppler parameters are consistent with high   ventricular filling pressure. - Mitral valve: Calcified annulus. There was mild regurgitation. - Tricuspid valve: There was mild regurgitation. - Pulmonic valve: There was trivial regurgitation. - Pulmonary arteries: PA peak pressure: 37 mm Hg (S). Impressions: - The right ventricular systolic pressure was increased consistent   with mild pulmonary hypertension.  EKG:  EKG is not *** ordered today. The ekg ordered today demonstrates   Recent Labs: 07/26/2017: ALT 6 09/22/2017: BUN 16; Creatinine, Ser 0.90; Hemoglobin 10.9; Platelets 171; Potassium 3.9; Sodium 143   Lipid Panel    Component Value Date/Time   CHOL 174 07/26/2017 1001   TRIG 65 07/26/2017 1001   HDL 76 07/26/2017 1001   CHOLHDL 2.5 01/16/2017 1036   CHOLHDL 3.0 07/13/2016 2331   VLDL 12 07/13/2016 2331   LDLCALC 85 07/26/2017 1001     Wt Readings from Last 3 Encounters:  10/28/17 68.5 kg  09/16/17 69.3 kg  08/07/17 67.9 kg     Other studies Reviewed: Additional studies/ records that were reviewed today include: . Review of the above records demonstrates:   Assessment and Plan:   1. CAD without angina: She does not have chest pain. She is having dyspnea with exertion. *** ? Stress test/cath Will continue ASA and statin. No beta blocker due to fatigue and hypotension.    2. HLD: ? statin  Current medicines are reviewed at length with the patient today.  The patient does not have concerns regarding medicines.  The following changes have been made:  no change  Labs/ tests ordered today include:   No orders of the defined types were placed in this encounter.  Disposition:   FU with me in 6 months   Signed, [name], MD 05/12/2018 [facility] [place], [place], [place]   [address] Phone: [phone]; Fax: [phone]

## 2018-05-19 DIAGNOSIS — C50411 Malignant neoplasm of upper-outer quadrant of right female breast: Secondary | ICD-10-CM | POA: Diagnosis not present

## 2018-05-19 DIAGNOSIS — K219 Gastro-esophageal reflux disease without esophagitis: Secondary | ICD-10-CM | POA: Diagnosis not present

## 2018-05-22 NOTE — Progress Notes (Deleted)
No chief complaint on file.   History of Present Illness: 78 yo female with history of CAD, COPD, GERD and hypothyroidism who is here today for cardiac follow up. She was admitted to [facility] May 2018 with c/o left arm pain and found to have a NSTEMI. Cardiac cath May 2018 with severe stenosis in the mid RCA treated with a drug eluting stent. There was a moderate stenosis in the mid LAD. Echo May 2018 with normal LV systolic function, mild valve disease. She was discharged on Brilinta and readmitted one week later with a GI bleed. EGD showed gastritis. She was changed from Brilinta to Plavix and bleeding resolved. Chest pain and dyspnea at times felt to be due to her asthma as it improved with her inhalers. She has only tolerated Crestor 3 days per week. She cancelled a stress test arranged in March 2019. She has been found to have a *** breast mass and has been seen by Dr. Donne Hazel with [facility]. Planning underway for a breast lumpectomy ***.  She is here today for follow up. The patient denies any chest pain, dyspnea, palpitations, lower extremity edema, orthopnea, PND, dizziness, near syncope or syncope.   Primary Care Physician: [name], MD  Past Medical History:  Diagnosis Date  . Arthritis   . Asthma    daily and prn inhalers; states good control currently  . CAD S/P percutaneous coronary angioplasty 07/15/2016  . COPD (chronic obstructive pulmonary disease) ([place])   . GERD (gastroesophageal reflux disease)    no current med.  . GI bleed 07/25/2016   Brilinta changed to Plavix  . Hashimoto's thyroiditis   . Headache    allergies; silent migraines  . Hiatal hernia   . Hypothyroidism   . Pneumonia   . 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 Vitamin D deficiency     Past Surgical History:  Procedure Laterality Date  . ABDOMINAL HYSTERECTOMY     partial  . APPENDECTOMY    . BREAST BIOPSY Left   . BREAST CYST EXCISION Left   . BREAST EXCISIONAL  BIOPSY Left   . BREAST LUMPECTOMY WITH RADIOACTIVE SEED LOCALIZATION Left 04/07/2015   Procedure: BREAST LUMPECTOMY WITH RADIOACTIVE SEED LOCALIZATION;  Surgeon: Erroll Luna, MD;  Location: [place];  Service: General;  Laterality: Left;  . CORONARY STENT INTERVENTION N/A 07/15/2016   Procedure: Coronary Stent Intervention;  Surgeon: Sherren Mocha, MD;  Location: [place] [facility];  Service: Cardiovascular;  Laterality: N/A;  . ESOPHAGOGASTRODUODENOSCOPY (EGD) WITH PROPOFOL Left 07/25/2016   Procedure: ESOPHAGOGASTRODUODENOSCOPY (EGD) WITH PROPOFOL;  Surgeon: Ronnette Juniper, MD;  Location: [place];  Service: Gastroenterology;  Laterality: Left;  . KNEE ARTHROSCOPY Right 12/07/2003  . LAPAROSCOPIC SALPINGO OOPHERECTOMY Right 09/08/2003  . LEFT HEART CATH N/A 07/15/2016   Procedure: Left Heart Cath;  Surgeon: Sherren Mocha, MD;  Location: [place] [facility];  Service: Cardiovascular;  Laterality: N/A;  . LYSIS OF ADHESION  09/08/2003  . NASAL SINUS SURGERY  age 47  . RECTAL POLYPECTOMY  10/02/2006    Current Outpatient Medications  Medication Sig Dispense Refill  . albuterol (PROVENTIL HFA;VENTOLIN HFA) 108 (90 Base) MCG/ACT inhaler INHALE 2 PUFFS INTO LUNGS EVERY 6 HOURS AS NEEDED FOR WHEEZING OR SHORTNESS OF BREATH 27 g 0  . albuterol (PROVENTIL) (2.5 MG/3ML) 0.083% nebulizer solution Take 3 mLs (2.5 mg total) by nebulization every 6 (six) hours as needed for wheezing or shortness of breath. 75 mL 0  . aspirin  EC 81 MG EC tablet Take 1 tablet (81 mg total) by mouth daily.    Marland Kitchen levothyroxine (SYNTHROID, LEVOTHROID) 137 MCG tablet Take 137 mcg by mouth daily before breakfast.    . meclizine (ANTIVERT) 25 MG tablet Take 1 tablet (25 mg total) by mouth 3 (three) times daily as needed for dizziness. 30 tablet 0  . nitroGLYCERIN (NITROSTAT) 0.4 MG SL tablet DISSOLVE ONE TABLET UNDER THE TONGUE EVERY 5 MINUTES AS NEEDED FOR CHEST PAIN. (CHEST PAIN OR SHORTNESS OF BREATH) 25 tablet 8   . SYMBICORT 160-4.5 MCG/ACT inhaler INHALE 2 PUFFS INTO THE LUNGS TWICE DAILY 33 g 0   No current facility-administered medications for this visit.     Allergies  Allergen Reactions  . Adenosine Shortness Of Breath  . Contrast Media [Iodinated Diagnostic Agents] Anaphylaxis  . Other Anaphylaxis  . Codeine Sulfate Nausea And Vomiting  . Latex Other (See Comments)    Skin irritation  . Adhesive [Tape] Other (See Comments)    SKIN IRRITATION    Social History   Socioeconomic History  . Marital status: Divorced    Spouse name: Not on file  . Number of children: 2  . Years of education: Not on file  . Highest education level: Not on file  Occupational History  . 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 Financial resource strain: Not on file  . Food insecurity:    Worry: Not on file    Inability: Not on file  . Transportation needs:    Medical: Not on file    Non-medical: Not on file  Tobacco Use  . Smoking status: Never Smoker  . Smokeless tobacco: Never Used  Substance and Sexual Activity  . Alcohol use: Yes    Comment: rarely  . Drug use: No  . Sexual activity: Not on file  Lifestyle  . Physical activity:    Days per week: Not on file    Minutes per session: Not on file  . Stress: Not on file  Relationships  . Social connections:    Talks on phone: Not on file    Gets together: Not on file    Attends religious service: Not on file    Active member of club or organization: Not on file    Attends meetings of clubs or organizations: Not on file    Relationship status: Not on file  . Intimate partner violence:    Fear of current or ex partner: Not on file    Emotionally abused: Not on file    Physically abused: Not on file    Forced sexual activity: Not on file  Other Topics Concern  . Not on file  Social History Narrative  . Not on file    Family History  Problem Relation Age of Onset  . Asthma  Mother   . Heart disease Mother   . Asthma Sister   . Heart disease Sister   . Breast cancer Sister 82  . Heart disease Father   . Kidney disease Father   . Prostate cancer Brother        dx. 81s  . Heart attack Brother 56  . Alzheimer's disease Brother 65  . Breast cancer Maternal Aunt 70  . Stomach cancer Maternal Grandfather 60  . Asthma Sister   . Memory loss Sister   . Heart attack Son 89  . Spinal muscular atrophy Grandchild  type II  . Cancer Other 63       niece dx. ca of salivary gland   . Breast cancer Other 36       niece; negative genetic testing 10 years ago  . Melanoma Other   . Alzheimer's disease Maternal Aunt   . Cervical cancer Cousin        maternal 1st cousin dx. at 60  . Breast cancer Cousin 35       maternal 1st cousin  . Lung cancer Cousin 67       maternal 1st cousin; smoker  . Heart disease Cousin   . Leukemia Cousin 14       paternal 1st cousin  . Breast cancer Other        paternal great grandmother (PGF's mother) dx. late 47s-early 20s  . Coronary artery disease Other     Review of Systems:  As stated in the HPI and otherwise negative.   There were no vitals taken for this visit.  Physical Examination:  General: Well developed, well nourished, NAD  HEENT: OP clear, mucus membranes moist  SKIN: warm, dry. No rashes. Neuro: No focal deficits  Musculoskeletal: Muscle strength 5/5 all ext  Psychiatric: Mood and affect normal  Neck: No JVD, no carotid bruits, no thyromegaly, no lymphadenopathy.  Lungs:Clear bilaterally, no wheezes, rhonci, crackles Cardiovascular: Regular rate and rhythm. No murmurs, gallops or rubs. Abdomen:Soft. Bowel sounds present. Non-tender.  Extremities: No lower extremity edema. Pulses are 2 + in the bilateral DP/PT.  Echo 07/16/16: - Left ventricle: The cavity size was normal. Systolic function was   normal. The estimated ejection fraction was in the range of 60%   to 65%. Wall motion was normal; there  were no regional wall   motion abnormalities. Features are consistent with a pseudonormal   left ventricular filling pattern, with concomitant abnormal   relaxation and increased filling pressure (grade 2 diastolic   dysfunction). Doppler parameters are consistent with high   ventricular filling pressure. - Mitral valve: Calcified annulus. There was mild regurgitation. - Tricuspid valve: There was mild regurgitation. - Pulmonic valve: There was trivial regurgitation. - Pulmonary arteries: PA peak pressure: 37 mm Hg (S). Impressions: - The right ventricular systolic pressure was increased consistent   with mild pulmonary hypertension.  EKG:  EKG is not *** ordered today. The ekg ordered today demonstrates   Recent Labs: 07/26/2017: ALT 6 09/22/2017: BUN 16; Creatinine, Ser 0.90; Hemoglobin 10.9; Platelets 171; Potassium 3.9; Sodium 143   Lipid Panel    Component Value Date/Time   CHOL 174 07/26/2017 1001   TRIG 65 07/26/2017 1001   HDL 76 07/26/2017 1001   CHOLHDL 2.5 01/16/2017 1036   CHOLHDL 3.0 07/13/2016 2331   VLDL 12 07/13/2016 2331   LDLCALC 85 07/26/2017 1001     Wt Readings from Last 3 Encounters:  10/28/17 151 lb (68.5 kg)  09/16/17 152 lb 12.8 oz (69.3 kg)  08/07/17 149 lb 9.6 oz (67.9 kg)     Other studies Reviewed: Additional studies/ records that were reviewed today include: . Review of the above records demonstrates:   Assessment and Plan:   1. CAD without angina: ***  Will continue ASA. She has not tolerated statins. She has not been on a beta blocker due to fatigue and hypotension.    2. HLD: LDL near goal but she is statin intolerant. *** Can consider Praluent or Repatha.    Current medicines are reviewed at length with the patient today.  The patient does not have concerns regarding medicines.  The following changes have been made:  no change  Labs/ tests ordered today include:   No orders of the defined types were placed in this encounter.    Disposition:   FU with me in 6 months   Signed, [name], MD 05/22/2018 3:54 PM    [facility] [place], [place], [place]  [address] Phone: [phone]; Fax: [phone]

## 2018-05-23 ENCOUNTER — Other Ambulatory Visit: Payer: Self-pay

## 2018-05-23 ENCOUNTER — Telehealth: Payer: Self-pay | Admitting: Cardiovascular Disease

## 2018-05-23 ENCOUNTER — Encounter: Payer: Self-pay | Admitting: Cardiovascular Disease

## 2018-05-23 ENCOUNTER — Ambulatory Visit (INDEPENDENT_AMBULATORY_CARE_PROVIDER_SITE_OTHER): Payer: Medicare Other | Admitting: Cardiovascular Disease

## 2018-05-23 ENCOUNTER — Ambulatory Visit: Payer: Medicare Other | Admitting: Cardiovascular Disease

## 2018-05-23 VITALS — BP 120/60 | HR 81 | Ht 64.0 in | Wt 156.2 lb

## 2018-05-23 DIAGNOSIS — I2510 Atherosclerotic heart disease of native coronary artery without angina pectoris: Secondary | ICD-10-CM

## 2018-05-23 DIAGNOSIS — R790 Abnormal level of blood mineral: Secondary | ICD-10-CM | POA: Diagnosis not present

## 2018-05-23 DIAGNOSIS — Z01810 Encounter for preprocedural cardiovascular examination: Secondary | ICD-10-CM | POA: Diagnosis not present

## 2018-05-23 DIAGNOSIS — K219 Gastro-esophageal reflux disease without esophagitis: Secondary | ICD-10-CM | POA: Diagnosis not present

## 2018-05-23 DIAGNOSIS — E785 Hyperlipidemia, unspecified: Secondary | ICD-10-CM | POA: Diagnosis not present

## 2018-05-23 NOTE — Telephone Encounter (Signed)
I spoke with pt. She had to cancel this mornings appointment but can come in this afternoon. I scheduled her to see Dr. Angelena Form today at 3:00

## 2018-05-23 NOTE — Patient Instructions (Signed)

## 2018-05-23 NOTE — Progress Notes (Signed)
Chief Complaint  Patient presents with  . Follow-up    CAD   History of Present Illness: 78 yo female with history of CAD, COPD, GERD and hypothyroidism who is here today for cardiac follow up. She was admitted to [facility] May 2018 with c/o left arm pain and found to have a NSTEMI. Cardiac cath May 2018 with severe stenosis in the mid RCA treated with a drug eluting stent. There was a moderate stenosis in the mid LAD. Echo May 2018 with normal LV systolic function, mild valve disease. She was discharged on Brilinta and readmitted one week later with a GI bleed. EGD showed gastritis. She was changed from Brilinta to Plavix and bleeding resolved. Chest pain and dyspnea at times felt to be due to her asthma as it improved with her inhalers. She has not tolerate statins. She cancelled a stress test arranged in March 2019. She has been found to have breast cancer. She is seeing Dr. Donne Hazel with [facility] and a lumpectomy is being planned.   She is here today for follow up. The patient denies any chest pain, palpitations, lower extremity edema, orthopnea, PND, dizziness, near syncope or syncope. She had dyspnea several weeks ago which she attributes to her asthma. This is much improved over the last week. No exertional chest or arm pain. Her presentation in 2018 in the setting of her MI was arm pain and she has had no significant arm pain over the past year.    Primary Care Physician: [name], MD  Past Medical History:  Diagnosis Date  . Arthritis   . Asthma    daily and prn inhalers; states good control currently  . CAD S/P percutaneous coronary angioplasty 07/15/2016  . COPD (chronic obstructive pulmonary disease) ([place])   . GERD (gastroesophageal reflux disease)    no current med.  . GI bleed 07/25/2016   Brilinta changed to Plavix  . Hashimoto's thyroiditis   . Headache    allergies; silent migraines  . Hiatal hernia   . Hypothyroidism   . Pneumonia   . Sclerosing  adenosis of left breast 03/2015  . Vitamin D deficiency     Past Surgical History:  Procedure Laterality Date  . ABDOMINAL HYSTERECTOMY     partial  . APPENDECTOMY    . BREAST BIOPSY Left   . BREAST CYST EXCISION Left   . BREAST EXCISIONAL BIOPSY Left   . BREAST LUMPECTOMY WITH RADIOACTIVE SEED LOCALIZATION Left 04/07/2015   Procedure: BREAST LUMPECTOMY WITH RADIOACTIVE SEED LOCALIZATION;  Surgeon: Erroll Luna, MD;  Location: [place];  Service: General;  Laterality: Left;  . CORONARY STENT INTERVENTION N/A 07/15/2016   Procedure: Coronary Stent Intervention;  Surgeon: Sherren Mocha, MD;  Location: [place] [facility];  Service: Cardiovascular;  Laterality: N/A;  . ESOPHAGOGASTRODUODENOSCOPY (EGD) WITH PROPOFOL Left 07/25/2016   Procedure: ESOPHAGOGASTRODUODENOSCOPY (EGD) WITH PROPOFOL;  Surgeon: Ronnette Juniper, MD;  Location: [place];  Service: Gastroenterology;  Laterality: Left;  . KNEE ARTHROSCOPY Right 12/07/2003  . LAPAROSCOPIC SALPINGO OOPHERECTOMY Right 09/08/2003  . LEFT HEART CATH N/A 07/15/2016   Procedure: Left Heart Cath;  Surgeon: Sherren Mocha, MD;  Location: [place] [facility];  Service: Cardiovascular;  Laterality: N/A;  . LYSIS OF ADHESION  09/08/2003  . NASAL SINUS SURGERY  age 2  . RECTAL POLYPECTOMY  10/02/2006    Current Outpatient Medications  Medication Sig Dispense Refill  . albuterol (PROVENTIL HFA;VENTOLIN HFA) 108 (90 Base) MCG/ACT inhaler INHALE 2 PUFFS  INTO LUNGS EVERY 6 HOURS AS NEEDED FOR WHEEZING OR SHORTNESS OF BREATH 27 g 0  . albuterol (PROVENTIL) (2.5 MG/3ML) 0.083% nebulizer solution Take 3 mLs (2.5 mg total) by nebulization every 6 (six) hours as needed for wheezing or shortness of breath. 75 mL 0  . aspirin EC 81 MG EC tablet Take 1 tablet (81 mg total) by mouth daily.    . Ferrous Sulfate (IRON PO) Take 1 tablet by mouth daily.    Marland Kitchen levothyroxine (SYNTHROID, LEVOTHROID) 137 MCG tablet Take 137 mcg by mouth daily before  breakfast.    . nitroGLYCERIN (NITROSTAT) 0.4 MG SL tablet DISSOLVE ONE TABLET UNDER THE TONGUE EVERY 5 MINUTES AS NEEDED FOR CHEST PAIN. (CHEST PAIN OR SHORTNESS OF BREATH) 25 tablet 8  . rosuvastatin (CRESTOR) 10 MG tablet Take 1 tablet by mouth once a week.    . SYMBICORT 160-4.5 MCG/ACT inhaler INHALE 2 PUFFS INTO THE LUNGS TWICE DAILY 33 g 0   No current facility-administered medications for this visit.     Allergies  Allergen Reactions  . Adenosine Shortness Of Breath  . Contrast Media [Iodinated Diagnostic Agents] Anaphylaxis  . Other Anaphylaxis  . Codeine Sulfate Nausea And Vomiting  . Latex Other (See Comments)    Skin irritation  . Adhesive [Tape] Other (See Comments)    SKIN IRRITATION    Social History   Socioeconomic History  . Marital status: Divorced    Spouse name: Not on file  . Number of children: 2  . Years of education: Not on file  . Highest education level: Not on file  Occupational History  . 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 Financial resource strain: Not on file  . Food insecurity:    Worry: Not on file    Inability: Not on file  . Transportation needs:    Medical: Not on file    Non-medical: Not on file  Tobacco Use  . Smoking status: Never Smoker  . Smokeless tobacco: Never Used  Substance and Sexual Activity  . Alcohol use: Yes    Comment: rarely  . Drug use: No  . Sexual activity: Not on file  Lifestyle  . Physical activity:    Days per week: Not on file    Minutes per session: Not on file  . Stress: Not on file  Relationships  . Social connections:    Talks on phone: Not on file    Gets together: Not on file    Attends religious service: Not on file    Active member of club or organization: Not on file    Attends meetings of clubs or organizations: Not on file    Relationship status: Not on file  . Intimate partner violence:    Fear of current or ex partner: Not on  file    Emotionally abused: Not on file    Physically abused: Not on file    Forced sexual activity: Not on file  Other Topics Concern  . Not on file  Social History Narrative  . Not on file    Family History  Problem Relation Age of Onset  . Asthma Mother   . Heart disease Mother   . Asthma Sister   . Heart disease Sister   . Breast cancer Sister 77  . Heart disease Father   . Kidney disease Father   . Prostate cancer Brother        dx.  83s  . Heart attack Brother 36  . Alzheimer's disease Brother 60  . Breast cancer Maternal Aunt 70  . Stomach cancer Maternal Grandfather 60  . Asthma Sister   . Memory loss Sister   . Heart attack Son 3  . Spinal muscular atrophy Grandchild        type II  . Cancer Other 32       niece dx. ca of salivary gland   . Breast cancer Other 30       niece; negative genetic testing 10 years ago  . Melanoma Other   . Alzheimer's disease Maternal Aunt   . Cervical cancer Cousin        maternal 1st cousin dx. at 36  . Breast cancer Cousin 25       maternal 1st cousin  . Lung cancer Cousin 39       maternal 1st cousin; smoker  . Heart disease Cousin   . Leukemia Cousin 14       paternal 1st cousin  . Breast cancer Other        paternal great grandmother (PGF's mother) dx. late 15s-early 5s  . Coronary artery disease Other     Review of Systems:  As stated in the HPI and otherwise negative.   BP 120/60   Pulse 81   Ht 5\' 4"  (1.626 m)   Wt 156 lb 3.2 oz (70.9 kg)   SpO2 97%   BMI 26.81 kg/m   Physical Examination:  General: Well developed, well nourished, NAD  HEENT: OP clear, mucus membranes moist  SKIN: warm, dry. No rashes. Neuro: No focal deficits  Musculoskeletal: Muscle strength 5/5 all ext  Psychiatric: Mood and affect normal  Neck: No JVD, no carotid bruits, no thyromegaly, no lymphadenopathy.  Lungs:Clear bilaterally, no wheezes, rhonci, crackles Cardiovascular: Regular rate and rhythm. Soft, systolic murmur.   Abdomen:Soft. Bowel sounds present. Non-tender.  Extremities: No lower extremity edema. Pulses are 2 + in the bilateral DP/PT.  Echo 07/16/16: - Left ventricle: The cavity size was normal. Systolic function was   normal. The estimated ejection fraction was in the range of 60%   to 65%. Wall motion was normal; there were no regional wall   motion abnormalities. Features are consistent with a pseudonormal   left ventricular filling pattern, with concomitant abnormal   relaxation and increased filling pressure (grade 2 diastolic   dysfunction). Doppler parameters are consistent with high   ventricular filling pressure. - Mitral valve: Calcified annulus. There was mild regurgitation. - Tricuspid valve: There was mild regurgitation. - Pulmonic valve: There was trivial regurgitation. - Pulmonary arteries: PA peak pressure: 37 mm Hg (S). Impressions: - The right ventricular systolic pressure was increased consistent   with mild pulmonary hypertension.  EKG:  EKG is ordered today. The ekg ordered today demonstrates NSR, rate 81 bpm.   Recent Labs: 07/26/2017: ALT 6 09/22/2017: BUN 16; Creatinine, Ser 0.90; Hemoglobin 10.9; Platelets 171; Potassium 3.9; Sodium 143   Lipid Panel    Component Value Date/Time   CHOL 174 07/26/2017 1001   TRIG 65 07/26/2017 1001   HDL 76 07/26/2017 1001   CHOLHDL 2.5 01/16/2017 1036   CHOLHDL 3.0 07/13/2016 2331   VLDL 12 07/13/2016 2331   LDLCALC 85 07/26/2017 1001     Wt Readings from Last 3 Encounters:  05/23/18 156 lb 3.2 oz (70.9 kg)  10/28/17 151 lb (68.5 kg)  09/16/17 152 lb 12.8 oz (69.3 kg)     Other studies  Reviewed: Additional studies/ records that were reviewed today include: . Review of the above records demonstrates:   Assessment and Plan:   1. CAD without angina: She has no signs or symptoms suggestive of unstable angina. Will continue ASA and statin. She is not on a beta blocker due to fatigue and hypotension.    2. HLD: LDL  near goal in 2019. She does not tolerate high doses of statins. Continue statin.   3. Pre-operative cardiovascular examination: She can achieve 4 METS. She is having no angina, signs of CHF or arrhythmias. She can proceed with her planned surgical procedure without further cardiac workup.    Current medicines are reviewed at length with the patient today.  The patient does not have concerns regarding medicines.  The following changes have been made:  no change  Labs/ tests ordered today include:   Orders Placed This Encounter  Procedures  . EKG 12-Lead     Disposition:   FU with me in 6 months   Signed, [name], MD 05/23/2018 3:03 PM    [facility] [place], [place], [place]  [address] Phone: [phone]; Fax: [phone]

## 2018-05-23 NOTE — Telephone Encounter (Signed)
Patient was unable to come in today for her appt, she wanted note sent to nurse, so nurse can fit her into another appt.

## 2018-05-27 ENCOUNTER — Telehealth: Payer: Self-pay | Admitting: Oncology

## 2018-05-27 ENCOUNTER — Encounter: Payer: Self-pay | Admitting: Oncology

## 2018-05-27 NOTE — Telephone Encounter (Signed)
A new patient appt has been scheduled for the pt to see Dr. Jana Hakim on 3/25 at 4pm with labs at 3:30pm. Pt has been made aware of the appt date and time. Letter mailed.

## 2018-05-29 NOTE — Progress Notes (Signed)
  Radiation Oncology         [phone] ________________________________  Initial telephone consultation  Name: Sonia [name] MRN: [id number]  Date: 05/30/2018  DOB: [birth year]  [id number], [name], MD  Rolm Bookbinder, MD   REFERRING PHYSICIAN: Rolm Bookbinder, MD  DIAGNOSIS:    ICD-10-CM   1. Carcinoma of upper-outer quadrant of right breast in female, estrogen receptor positive ([place]) C50.411    Z17.0   Cancer Staging Carcinoma of upper-outer quadrant of right breast in female, estrogen receptor positive ([place]) Staging form: Breast, AJCC 8th Edition - Clinical: Stage IA (cT1b, cN0, cM0, G1, ER+, PR+, HER2-) - Signed by Eppie Gibson, MD on 05/30/2018  CHIEF COMPLAINT: right breast cancer   Due to the Covid 19 virus, the patient requested a telephone consultation with me regarding her stage I right breast cancer.  The patient had a small lesion found on screening mammogram.  Clinical measurement is 7 mm.  No adenopathy in the axilla on ultrasound.  Pathology/biopsy results consistent with that above.  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 Donne Hazel.  She is not sure if this may be delayed in light of the Covid 19 pandemic  She is anticipating consultation next week with medical oncology  We discussed her comorbid conditions.   Today, I talked to the patient about the findings and work-up thus far. We discussed the patient's diagnosis of early-stage ER positive right breast cancer and general treatment for this  For the patient's early stage favorable risk breast cancer, we had a thorough discussion about her options for adjuvant therapy. One option would be antiestrogen therapy as discussed with medical oncology. She would take a pill for approximately 5 years. The alternative option would be radiotherapy to the breast.   I recommend that she proceed with consultation with medical oncology.  I  think that her prognosis will be excellent with breast conserving surgery followed by antiestrogen pill alone.  If for some reason she does not want to take the pill after this consultation then we can schedule her for simulation for radiotherapy.  Anticipate that radiotherapy would take 3.5 weeks.  However I would not recommend that she get any radiotherapy right away until the risks of Covid 19 infection settle.  For now I recommend social isolation as much as possible   The patient has my contact information.  She will call if she opts out of taking the antiestrogen pill.  However I think that the pill is in her best interest and I would recommend that over radiotherapy.  She will also talk to medical oncology about potentially taking the pill first to delay her lumpectomy.  The patient was encouraged to ask questions that I answered to the best of my ability. I will be available PRN.   I spent 10 minutes in this telephone encounter with the patient, over 50% of that time was spent in counseling and/or coordination of care.   __________________________________________   Eppie Gibson, MD

## 2018-05-30 ENCOUNTER — Ambulatory Visit
Admission: RE | Admit: 2018-05-30 | Discharge: 2018-05-30 | Disposition: A | Discharge status: Home / Self Care | Payer: Medicare Other | Source: Ambulatory Visit | Attending: Radiation Oncology | Admitting: Radiation Oncology

## 2018-05-30 ENCOUNTER — Ambulatory Visit: Payer: Medicare Other

## 2018-05-30 ENCOUNTER — Other Ambulatory Visit: Payer: Self-pay

## 2018-05-30 ENCOUNTER — Encounter: Payer: Self-pay | Admitting: Radiation Oncology

## 2018-05-30 DIAGNOSIS — Z170 Estrogen receptor positive status [ER+]: Principal | ICD-10-CM

## 2018-05-30 DIAGNOSIS — C50411 Malignant neoplasm of upper-outer quadrant of right female breast: Secondary | ICD-10-CM | POA: Insufficient documentation

## 2018-06-02 ENCOUNTER — Other Ambulatory Visit: Payer: Self-pay | Admitting: Pulmonary Disease

## 2018-06-04 ENCOUNTER — Inpatient Hospital Stay: Payer: Medicare Other

## 2018-06-04 ENCOUNTER — Ambulatory Visit: Payer: Medicare Other | Admitting: Oncology

## 2018-06-04 ENCOUNTER — Inpatient Hospital Stay: Payer: Medicare Other | Attending: Oncology | Admitting: Oncology

## 2018-06-04 ENCOUNTER — Telehealth: Payer: Self-pay | Admitting: Oncology

## 2018-06-04 ENCOUNTER — Other Ambulatory Visit: Payer: Self-pay

## 2018-06-04 ENCOUNTER — Other Ambulatory Visit: Payer: Medicare Other

## 2018-06-04 VITALS — BP 135/74 | HR 85 | Temp 97.3°F | Resp 18 | Ht 64.0 in | Wt 153.8 lb

## 2018-06-04 DIAGNOSIS — Z170 Estrogen receptor positive status [ER+]: Secondary | ICD-10-CM

## 2018-06-04 DIAGNOSIS — C50411 Malignant neoplasm of upper-outer quadrant of right female breast: Secondary | ICD-10-CM | POA: Diagnosis not present

## 2018-06-04 DIAGNOSIS — I2510 Atherosclerotic heart disease of native coronary artery without angina pectoris: Secondary | ICD-10-CM | POA: Insufficient documentation

## 2018-06-04 DIAGNOSIS — Z79899 Other long term (current) drug therapy: Secondary | ICD-10-CM | POA: Insufficient documentation

## 2018-06-04 DIAGNOSIS — M8580 Other specified disorders of bone density and structure, unspecified site: Secondary | ICD-10-CM | POA: Diagnosis not present

## 2018-06-04 DIAGNOSIS — E063 Autoimmune thyroiditis: Secondary | ICD-10-CM | POA: Diagnosis not present

## 2018-06-04 DIAGNOSIS — Z79810 Long term (current) use of selective estrogen receptor modulators (SERMs): Secondary | ICD-10-CM | POA: Diagnosis not present

## 2018-06-04 DIAGNOSIS — J449 Chronic obstructive pulmonary disease, unspecified: Secondary | ICD-10-CM | POA: Insufficient documentation

## 2018-06-04 LAB — CBC WITH DIFFERENTIAL/PLATELET
Abs Immature Granulocytes: 0.02 10*3/uL (ref 0.00–0.07)
Basophils Absolute: 0.1 10*3/uL (ref 0.0–0.1)
Basophils Relative: 1 %
EOS PCT: 2 %
Eosinophils Absolute: 0.1 10*3/uL (ref 0.0–0.5)
HCT: 42.6 % (ref 36.0–46.0)
Hemoglobin: 13.5 g/dL (ref 12.0–15.0)
Immature Granulocytes: 0 %
Lymphocytes Relative: 19 %
Lymphs Abs: 1.6 10*3/uL (ref 0.7–4.0)
MCH: 27.1 pg (ref 26.0–34.0)
MCHC: 31.7 g/dL (ref 30.0–36.0)
MCV: 85.4 fL (ref 80.0–100.0)
MONO ABS: 0.5 10*3/uL (ref 0.1–1.0)
Monocytes Relative: 6 %
NEUTROS ABS: 6.1 10*3/uL (ref 1.7–7.7)
Neutrophils Relative %: 72 %
Platelets: 163 10*3/uL (ref 150–400)
RBC: 4.99 MIL/uL (ref 3.87–5.11)
RDW: 19.9 % — ABNORMAL HIGH (ref 11.5–15.5)
WBC: 8.4 10*3/uL (ref 4.0–10.5)
nRBC: 0 % (ref 0.0–0.2)

## 2018-06-04 LAB — COMPREHENSIVE METABOLIC PANEL
ALT: 6 U/L (ref 0–44)
AST: 16 U/L (ref 15–41)
Albumin: 3.8 g/dL (ref 3.5–5.0)
Alkaline Phosphatase: 66 U/L (ref 38–126)
Anion gap: 8 (ref 5–15)
BUN: 24 mg/dL — AB (ref 8–23)
CO2: 26 mmol/L (ref 22–32)
Calcium: 9.2 mg/dL (ref 8.9–10.3)
Chloride: 107 mmol/L (ref 98–111)
Creatinine, Ser: 1.02 mg/dL — ABNORMAL HIGH (ref 0.44–1.00)
GFR calc Af Amer: >60 mL/min (ref >60)
GFR, EST NON AFRICAN AMERICAN: 52 mL/min — AB (ref >60)
Glucose, Bld: 103 mg/dL — ABNORMAL HIGH (ref 70–99)
Potassium: 4.5 mmol/L (ref 3.5–5.1)
Sodium: 141 mmol/L (ref 135–145)
Total Bilirubin: 0.5 mg/dL (ref 0.3–1.2)
Total Protein: 7.1 g/dL (ref 6.5–8.1)

## 2018-06-04 LAB — IRON AND TIBC
IRON: 99 ug/dL (ref 41–142)
Saturation Ratios: 31 % (ref 21–57)
TIBC: 316 ug/dL (ref 236–444)
UIBC: 217 ug/dL (ref 120–384)

## 2018-06-04 LAB — FERRITIN: FERRITIN: 19 ng/mL (ref 11–307)

## 2018-06-04 MED ORDER — TAMOXIFEN CITRATE 20 MG PO TABS: 20.0000 mg | ORAL_TABLET | Freq: Every day | ORAL | 12 refills | Status: AC

## 2018-06-04 NOTE — Progress Notes (Signed)
See separate note on this patient

## 2018-06-04 NOTE — Telephone Encounter (Signed)
Pt's appt has been moved for the pt to see Dr. Jana Hakim at 1pm with labs at 1230pm.

## 2018-06-04 NOTE — Progress Notes (Signed)
[place]  [phone] [phone]     ID: Sonia [name] DOB: [birth year]  MR#: [id number]  KWI#:[id number]  Patient Care Team: Sonia Orn, MD as PCP - General (Internal Medicine) Sonia Blanks, MD as PCP - Cardiology (Cardiology) Sonia Gibson, MD as Consulting Physician (Radiation Oncology) Sonia [name], Sonia Dad, MD as Consulting Physician (Oncology) Sonia Mires, MD as Consulting Physician (Pulmonary Disease) Sonia Bookbinder, MD as Consulting Physician (General Surgery) Sonia Cruel, MD OTHER MD:  CHIEF COMPLAINT: Estrogen receptor positive breast cancer  CURRENT TREATMENT: Tamoxifen   HISTORY OF CURRENT ILLNESS: "Sonia [name]" had routine screening mammography on 04/23/2018 showing a possible abnormality in the right breast. She underwent bilateral diagnostic mammography with tomography and right breast ultrasonography at [facility] on 04/30/2018 showing: 0.7 cm mass with associated architectural distortion in the 9 o'clock position of the right breast; normal appearing right axillary lymph nodes.  Accordingly on 05/02/2018 she proceeded to biopsy of the right breast area in question. The pathology from this procedure ([id number]) showed: invasive ductal carcinoma, with background complex sclerosing lesion, grade 1. Prognostic indicators significant for: estrogen receptor, 95% positive and progesterone receptor, 90% positive, both with strong staining intensity. Proliferation marker Ki67 at 10%. HER2 negative (1+).   Of note, she underwent left breast lumpectomy on 04/07/2015, with pathology ([id number]) showing lobular neoplasia (atypical lobular hyperplasia). She saw Dr. Lindi Adie following this procedure and opted for active surveillance at that time. She did undergo genetic testing on 05/10/2015 with negative results.  The patient's subsequent history is as detailed below.   INTERVAL HISTORY: Sonia [name] was evaluated in the  breast cancer clinic on 06/04/2018.   REVIEW OF SYSTEMS: Cat[place]ah reports enjoying deep water aerobics during the warmer months. She notes she doesn't exercise otherwise. There were no specific symptoms leading to the original mammogram, which was routinely scheduled. The patient denies unusual headaches, visual changes, nausea, vomiting, stiff neck, dizziness, or gait imbalance. There has been no cough, phlegm production, or pleurisy, no chest pain or pressure, and no change in bowel or bladder habits. The patient denies fever, rash, bleeding, unexplained fatigue or unexplained weight loss. A detailed review of systems was otherwise entirely negative.   PAST MEDICAL HISTORY: Past Medical History:  Diagnosis Date  . Arthritis   . Asthma    daily and prn inhalers; states good control currently  . CAD S/P percutaneous coronary angioplasty 07/15/2016  . COPD (chronic obstructive pulmonary disease) ([place])   . GERD (gastroesophageal reflux disease)    no current med.  . GI bleed 07/25/2016   Brilinta changed to Plavix  . Hashimoto's thyroiditis   . Headache    allergies; silent migraines  . Hiatal hernia   . Hypothyroidism   . Pneumonia   . 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 Vitamin D deficiency   NSTEMI 07/13/2016   PAST SURGICAL HISTORY: Past Surgical History:  Procedure Laterality Date  . ABDOMINAL HYSTERECTOMY     partial  . APPENDECTOMY    . BREAST BIOPSY Left   . BREAST CYST EXCISION Left   . BREAST EXCISIONAL BIOPSY Left   . BREAST LUMPECTOMY WITH RADIOACTIVE SEED LOCALIZATION Left 04/07/2015   Procedure: BREAST LUMPECTOMY WITH RADIOACTIVE SEED LOCALIZATION;  Surgeon: Erroll Luna, MD;  Location: [facility];  Service: General;  Laterality: Left;  . CORONARY STENT INTERVENTION N/A 07/15/2016   Procedure: Coronary Stent Intervention;  Surgeon: Sherren Mocha, MD;  Location: [facility];  Service: Cardiovascular;  Laterality: N/A;  .  ESOPHAGOGASTRODUODENOSCOPY (EGD) WITH PROPOFOL Left 07/25/2016   Procedure: ESOPHAGOGASTRODUODENOSCOPY (EGD) WITH PROPOFOL;  Surgeon: Ronnette Juniper, MD;  Location: [facility];  Service: Gastroenterology;  Laterality: Left;  . KNEE ARTHROSCOPY Right 12/07/2003  . LAPAROSCOPIC SALPINGO OOPHERECTOMY Right 09/08/2003  . LEFT HEART CATH N/A 07/15/2016   Procedure: Left Heart Cath;  Surgeon: Sherren Mocha, MD;  Location: [facility];  Service: Cardiovascular;  Laterality: N/A;  . LYSIS OF ADHESION  09/08/2003  . NASAL SINUS SURGERY  age 56  . RECTAL POLYPECTOMY  10/02/2006    FAMILY HISTORY Family History  Problem Relation Age of Onset  . Asthma Mother   . Heart disease Mother   . Asthma Sister   . Heart disease Sister   . Breast cancer Sister 57  . Heart disease Father   . Kidney disease Father   . Prostate cancer Brother        dx. 50s  . Heart attack Brother 71  . Alzheimer's disease Brother 58  . Breast cancer Maternal Aunt 70  . Stomach cancer Maternal Grandfather 60  . Asthma Sister   . Memory loss Sister   . Heart attack Son 81  . Spinal muscular atrophy Grandchild        type II  . Cancer Other 17       niece dx. ca of salivary gland   . Breast cancer Other 2       niece; negative genetic testing 10 years ago  . Melanoma Other   . Alzheimer's disease Maternal Aunt   . Cervical cancer Cousin        maternal 1st cousin dx. at 58  . Breast cancer Cousin 18       maternal 1st cousin  . Lung cancer Cousin 51       maternal 1st cousin; smoker  . Heart disease Cousin   . Leukemia Cousin 14       paternal 1st cousin  . Breast cancer Other        paternal great grandmother (PGF's mother) dx. late 58s-early 76s  . Coronary artery disease Other    Patient's father was 24 years old when he died from kidney disease. Patient's mother died from heart disease at age 32. The patient has 3 siblings, 2 sisters and 1 brother. She denies a family hx of ovarian cancer. She reports  her sister was diagnosed with breast cancer at age 73, a meternal aunt at age 56, a maternal 1st cousin at age 78, a niece at age 63, and PGF's mother in her late 9s - early 107s. She also notes a family hx of other cancers, including prostate cancer in her brother, cervical cancer in a maternal 1st cousin, lung cancer in a paternal 1st cousin (smoker), salivary gland cancer in a niece, and melanoma in a niece.  GYNECOLOGIC HISTORY:  No LMP recorded. Patient has had a hysterectomy. Menarche: 78 years old Age at first live birth: 78 years old [place] P 2 LMP age 41 Contraceptive no HRT no  Hysterectomy? Yes, at age 43. BSO? 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 She is divorced/widowed. [She and her husband divorced, and he has since passed away.] She lives at home with her dog, Abby. She had a son, Marya Amsler, pass away at age 38 in 2005 from a heart attack. Son Cay Schillings, age 79, lives in [place] and runs  his own plumbing business. Marya Amsler had two girls, fraternal twins age 61. Sudie Bailey has one daughter, age 55.  The patient attends [employer].     ADVANCED DIRECTIVES: not in place; my: At the 06/04/2018 visit the patient was provided the appropriate forms to complete and notarize at her discretion   HEALTH MAINTENANCE: Social History   Tobacco Use  . Smoking status: Never Smoker  . Smokeless tobacco: Never Used  Substance Use Topics  . Alcohol use: Yes    Comment: rarely  . Drug use: No     Colonoscopy: ~2015, Dr. Wallis Mart, repeat in 10 years  PAP: 2019, negative  Bone density: 04/23/2018, T-score of -1.7   Allergies  Allergen Reactions  . Adenosine Shortness Of Breath  . Contrast Media [Iodinated Diagnostic Agents] Anaphylaxis  . Codeine Sulfate Nausea And Vomiting  . Latex Other (See Comments)    Skin irritation  . Adhesive [Tape] Other (See Comments)    SKIN IRRITATION     Current Outpatient Medications  Medication Sig Dispense Refill  . albuterol (PROVENTIL HFA;VENTOLIN HFA) 108 (90 Base) MCG/ACT inhaler INHALE 2 PUFFS INTO LUNGS EVERY 6 HOURS AS NEEDED FOR WHEEZING OR SHORTNESS OF BREATH 27 g 0  . albuterol (PROVENTIL) (2.5 MG/3ML) 0.083% nebulizer solution Take 3 mLs (2.5 mg total) by nebulization every 6 (six) hours as needed for wheezing or shortness of breath. 75 mL 0  . aspirin EC 81 MG EC tablet Take 1 tablet (81 mg total) by mouth daily.    . Ferrous Sulfate (IRON PO) Take 1 tablet by mouth daily.    Marland Kitchen levothyroxine (SYNTHROID, LEVOTHROID) 137 MCG tablet Take 137 mcg by mouth daily before breakfast.    . rosuvastatin (CRESTOR) 5 MG tablet     . SYMBICORT 160-4.5 MCG/ACT inhaler Inhale 2 puffs by mouth twice daily 33 g 0  . nitroGLYCERIN (NITROSTAT) 0.4 MG SL tablet DISSOLVE ONE TABLET UNDER THE TONGUE EVERY 5 MINUTES AS NEEDED FOR CHEST PAIN. (CHEST PAIN OR SHORTNESS OF BREATH) (Patient not taking: Reported on 06/04/2018) 25 tablet 8  . tamoxifen (NOLVADEX) 20 MG tablet Take 1 tablet (20 mg total) by mouth daily for 30 days. 90 tablet 12   No current facility-administered medications for this visit.     OBJECTIVE: Older white woman who appears younger than stated age  29:   06/04/18 1321  BP: 135/74  Pulse: 85  Resp: 18  Temp: (!) 97.3 F (36.3 C)  SpO2: 99%     Body mass index is 26.4 kg/m.   Wt Readings from Last 3 Encounters:  06/04/18 153 lb 12.8 oz (69.8 kg)  05/23/18 156 lb 3.2 oz (70.9 kg)  10/28/17 151 lb (68.5 kg)      ECOG FS:1 - Symptomatic but completely ambulatory  Ocular: Sclerae unicteric, pupils round and equal Lymphatic: No cervical or supraclavicular adenopathy Lungs no rales or rhonchi Heart regular rate and rhythm Abd soft, nontender, positive bowel sounds MSK no focal spinal tenderness, no joint edema Neuro: non-focal, well-oriented, appropriate affect Breasts: The right breast is status post recent biopsy.  I  do not palpate a mass.  There are no skin or nipple changes of concern.  The left breast is status post remote lumpectomy.  There are no findings of concern.  Both axillae are benign.   LAB RESULTS:  CMP     Component Value Date/Time   NA 141 06/04/2018 1432   K 4.5 06/04/2018 1432   CL 107 06/04/2018 1432  CO2 26 06/04/2018 1432   GLUCOSE 103 (H) 06/04/2018 1432   BUN 24 (H) 06/04/2018 1432   CREATININE 1.02 (H) 06/04/2018 1432   CALCIUM 9.2 06/04/2018 1432   PROT 7.1 06/04/2018 1432   PROT 6.2 07/26/2017 1002   ALBUMIN 3.8 06/04/2018 1432   ALBUMIN 4.1 07/26/2017 1002   AST 16 06/04/2018 1432   ALT 6 06/04/2018 1432   ALKPHOS 66 06/04/2018 1432   BILITOT 0.5 06/04/2018 1432   BILITOT 0.4 07/26/2017 1002   GFRNONAA 52 (L) 06/04/2018 1432   GFRAA >60 06/04/2018 1432    No results found for: TOTALPROTELP, ALBUMINELP, A1GS, A2GS, BETS, BETA2SER, GAMS, MSPIKE, SPEI  No results found for: KPAFRELGTCHN, LAMBDASER, KAPLAMBRATIO  Lab Results  Component Value Date   WBC 8.4 06/04/2018   NEUTROABS 6.1 06/04/2018   HGB 13.5 06/04/2018   HCT 42.6 06/04/2018   MCV 85.4 06/04/2018   PLT 163 06/04/2018    _0 @  No results found for: LABCA2  No components found for: [id number]  No results for input(s): INR in the last 168 hours.  No results found for: LABCA2  No results found for: SWH675  No results found for: FFM384  No results found for: YKZ993  No results found for: CA2729  No components found for: HGQUANT  No results found for: CEA1 / No results found for: CEA1   No results found for: AFPTUMOR  No results found for: CHROMOGRNA  No results found for: PSA1  Appointment on 06/04/2018  Component Date Value Ref Range Status  . Iron 06/04/2018 99  41 - 142 ug/dL Final  . TIBC 06/04/2018 316  236 - 444 ug/dL Final  . Saturation Ratios 06/04/2018 31  21 - 57 % Final  . UIBC 06/04/2018 217  120 - 384 ug/dL Final   Performed at [facility], [place] [address] [place], [place] [address]  . Sodium 06/04/2018 141  135 - 145 mmol/L Final  . Potassium 06/04/2018 4.5  3.5 - 5.1 mmol/L Final  . Chloride 06/04/2018 107  98 - 111 mmol/L Final  . CO2 06/04/2018 26  22 - 32 mmol/L Final  . Glucose, Bld 06/04/2018 103* 70 - 99 mg/dL Final  . BUN 06/04/2018 24* 8 - 23 mg/dL Final  . Creatinine, Ser 06/04/2018 1.02* 0.44 - 1.00 mg/dL Final  . Calcium 06/04/2018 9.2  8.9 - 10.3 mg/dL Final  . Total Protein 06/04/2018 7.1  6.5 - 8.1 g/dL Final  . Albumin 06/04/2018 3.8  3.5 - 5.0 g/dL Final  . AST 06/04/2018 16  15 - 41 U/L Final  . ALT 06/04/2018 6  0 - 44 U/L Final  . Alkaline Phosphatase 06/04/2018 66  38 - 126 U/L Final  . Total Bilirubin 06/04/2018 0.5  0.3 - 1.2 mg/dL Final  . GFR calc non Af Amer 06/04/2018 52* >60 mL/min Final  . GFR calc Af Amer 06/04/2018 >60  >60 mL/min Final  . Anion gap 06/04/2018 8  5 - 15 Final   Performed at [facility], [place] [address] [place], [place] [address]  . WBC 06/04/2018 8.4  4.0 - 10.5 K/uL Final  . RBC 06/04/2018 4.99  3.87 - 5.11 MIL/uL Final  . Hemoglobin 06/04/2018 13.5  12.0 - 15.0 g/dL Final  . HCT 06/04/2018 42.6  36.0 - 46.0 % Final  . MCV 06/04/2018 85.4  80.0 - 100.0 fL Final  . MCH 06/04/2018 27.1  26.0 - 34.0 pg Final  .  MCHC 06/04/2018 31.7  30.0 - 36.0 g/dL Final  . RDW 06/04/2018 19.9* 11.5 - 15.5 % Final  . Platelets 06/04/2018 163  150 - 400 K/uL Final  . nRBC 06/04/2018 0.0  0.0 - 0.2 % Final  . Neutrophils Relative % 06/04/2018 72  % Final  . Neutro Abs 06/04/2018 6.1  1.7 - 7.7 K/uL Final  . Lymphocytes Relative 06/04/2018 19  % Final  . Lymphs Abs 06/04/2018 1.6  0.7 - 4.0 K/uL Final  . Monocytes Relative 06/04/2018 6  % Final  . Monocytes Absolute 06/04/2018 0.5  0.1 - 1.0 K/uL Final  . Eosinophils Relative 06/04/2018 2  % Final  . Eosinophils Absolute 06/04/2018 0.1  0.0 - 0.5 K/uL Final  . Basophils Relative 06/04/2018 1   % Final  . Basophils Absolute 06/04/2018 0.1  0.0 - 0.1 K/uL Final  . Immature Granulocytes 06/04/2018 0  % Final  . Abs Immature Granulocytes 06/04/2018 0.02  0.00 - 0.07 K/uL Final   Performed at [facility], [place] [address] [place], [place] [address]    (this displays the last labs from the last 3 days)  No results found for: TOTALPROTELP, ALBUMINELP, A1GS, A2GS, BETS, BETA2SER, GAMS, MSPIKE, SPEI (this displays SPEP labs)  No results found for: KPAFRELGTCHN, LAMBDASER, KAPLAMBRATIO (kappa/lambda light chains)  No results found for: HGBA, HGBA2QUANT, HGBFQUANT, HGBSQUAN (Hemoglobinopathy evaluation)   No results found for: LDH  Lab Results  Component Value Date   IRON 99 06/04/2018   TIBC 316 06/04/2018   IRONPCTSAT 31 06/04/2018   (Iron and TIBC)  No results found for: FERRITIN  Urinalysis    Component Value Date/Time   COLORURINE YELLOW (A) 09/22/2017 0819   APPEARANCEUR CLEAR (A) 09/22/2017 0819   LABSPEC 1.010 09/22/2017 0819   PHURINE 7.5 09/22/2017 0819   GLUCOSEU NEGATIVE 09/22/2017 0819   GLUCOSEU NEGATIVE 11/28/2010 [place] 09/22/2017 0819   BILIRUBINUR NEGATIVE 09/22/2017 0819   KETONESUR NEGATIVE 09/22/2017 0819   PROTEINUR NEGATIVE 09/22/2017 0819   UROBILINOGEN 0.2 11/28/2010 1422   NITRITE NEGATIVE 09/22/2017 0819   LEUKOCYTESUR SMALL (A) 09/22/2017 0819     STUDIES: No results found.  ELIGIBLE FOR AVAILABLE RESEARCH PROTOCOL: no  ASSESSMENT: 78 y.o. [place] woman status post right breast upper outer quadrant biopsy 05/02/2018 for a clinical T1b N0, stage IA invasive ductal carcinoma, grade 1, estrogen and progesterone receptor positive, HER-2 nonamplified, with an MIB-1 of 10%  (1) no adjuvant radiation planned (discussed below)  (2) tamoxifen started 06/04/2018  (a) bone density at the breast center 04/23/2018 shows a T score of -1.7  (3) genetics testing 05/17/2015 through the custom Cancer  panel through [facility] ATM, BARD1, BRCA1, BRCA2, BRIP1, CDH1, CDK4, CDKN2A, CHEK2, EPCAM, FANCC, MLH1, MSH2, MSH6, NBN, PALB2, PMS2, PTEN, RAD51C, RAD51D, TP53, and XRCC2.   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 Specifically we reviewed the biology of the patient's diagnosis and the specifics of her situation.  We first reviewed the fact that cancer is not one disease but more than 100 different diseases and that it is important to keep them separate-- otherwise when friends and relatives discuss their own cancer experiences with [facility] confusion  can result. Similarly we explained that if breast cancer spreads to the bone or liver, the patient would not have bone cancer or liver cancer, but breast cancer in the bone and breast cancer in the liver: one cancer in three places-- not 3 different cancers which otherwise would have to be treated in 3 different ways.  We discussed the difference between local and systemic therapy. In terms of loco-regional treatment, lumpectomy plus radiation is equivalent to mastectomy as far as survival is concerned. For this reason, and because the cosmetic results are generally superior, we recommend breast conserving surgery.   We then discussed the rationale for systemic therapy. There is some risk that this cancer may have already spread to other parts of her body. Patients frequently ask at this point about bone scans, CAT scans and PET scans to find out if they have occult breast cancer somewhere else. The problem is that in early stage disease we are much more likely to find false positives then true cancers and this would expose the patient to unnecessary procedures as well  as unnecessary radiation. Scans cannot answer the question the patient really would like to know, which is whether she has microscopic disease elsewhere in her body. For those reasons we do not recommend them.  Of course we would proceed to aggressive evaluation of any symptoms that might suggest metastatic disease, but that is not the case here.  Next we went over the options for systemic therapy which are anti-estrogens, anti-HER-2 immunotherapy, and chemotherapy. Sitlaly does not meet criteria for anti-HER-2 immunotherapy. She is a good candidate for anti-estrogens.  The question of chemotherapy is more complicated. Chemotherapy is most effective in rapidly growing, aggressive tumors. It is much less effective in low-grade, slow growing cancers, like Britnie 's.  We have reviewed our data and noted that in tumors 1.0 cm or less, in women over 44, with very favorable phenotype such as this case, the Oncotype is invariably low risk.  Accordingly the patient will not need chemotherapy.  We also discussed adjuvant radiation.  This is standard in women under 2.  In women over 74 with tumors like Annell Greening, radiation may be omitted without compromising survival and with only a small increase in local recurrence risk.  In [place] case there is no question she should avoid chemotherapy, which would involve several weeks of visits to the cancer center at a time where she would be exposed to a virus which if she became infected likely would take her life given her lung problems  We then discussed antiestrogens in detail and with a full understanding of the possible toxicities side effects and complications of tamoxifen she will be starting that the day.  Layani has a good understanding of the overall plan. She agrees with it. She knows the goal of treatment in her case is cure. She will call with any problems that may develop before her next visit here.  Sonia Cruel, MD   06/04/2018 3:36 PM Medical  Oncology and Hematology [facility] [address] [place], [place] [address] Tel. [phone]    Fax. [phone]  This document serves as a record of services personally performed by Lurline Del, MD. It was created on his behalf by Wilburn Mylar, a trained medical scribe. The creation of this record is based on the scribe's personal observations and the provider's statements to them.   Lindie Spruce MD, have reviewed the above documentation for accuracy and completeness, and I  agree with the above.

## 2018-06-06 ENCOUNTER — Encounter: Payer: Self-pay | Admitting: *Deleted

## 2018-06-13 DIAGNOSIS — J301 Allergic rhinitis due to pollen: Secondary | ICD-10-CM | POA: Diagnosis not present

## 2018-06-13 DIAGNOSIS — J0101 Acute recurrent maxillary sinusitis: Secondary | ICD-10-CM | POA: Diagnosis not present

## 2018-06-30 ENCOUNTER — Other Ambulatory Visit: Payer: Self-pay | Admitting: General Surgery

## 2018-06-30 DIAGNOSIS — C50411 Malignant neoplasm of upper-outer quadrant of right female breast: Secondary | ICD-10-CM | POA: Diagnosis not present

## 2018-06-30 DIAGNOSIS — Z170 Estrogen receptor positive status [ER+]: Principal | ICD-10-CM

## 2018-07-01 ENCOUNTER — Encounter: Payer: Self-pay | Admitting: *Deleted

## 2018-07-01 ENCOUNTER — Other Ambulatory Visit: Payer: Self-pay | Admitting: General Surgery

## 2018-07-01 DIAGNOSIS — C50411 Malignant neoplasm of upper-outer quadrant of right female breast: Secondary | ICD-10-CM

## 2018-07-01 DIAGNOSIS — Z170 Estrogen receptor positive status [ER+]: Principal | ICD-10-CM

## 2018-07-03 DIAGNOSIS — E038 Other specified hypothyroidism: Secondary | ICD-10-CM | POA: Diagnosis not present

## 2018-07-03 DIAGNOSIS — K219 Gastro-esophageal reflux disease without esophagitis: Secondary | ICD-10-CM | POA: Diagnosis not present

## 2018-07-03 DIAGNOSIS — Z1331 Encounter for screening for depression: Secondary | ICD-10-CM | POA: Diagnosis not present

## 2018-07-03 DIAGNOSIS — C50911 Malignant neoplasm of unspecified site of right female breast: Secondary | ICD-10-CM | POA: Diagnosis not present

## 2018-07-03 DIAGNOSIS — K449 Diaphragmatic hernia without obstruction or gangrene: Secondary | ICD-10-CM | POA: Diagnosis not present

## 2018-07-03 DIAGNOSIS — I2510 Atherosclerotic heart disease of native coronary artery without angina pectoris: Secondary | ICD-10-CM | POA: Diagnosis not present

## 2018-07-03 DIAGNOSIS — Z0000 Encounter for general adult medical examination without abnormal findings: Secondary | ICD-10-CM | POA: Diagnosis not present

## 2018-07-03 DIAGNOSIS — Z85820 Personal history of malignant melanoma of skin: Secondary | ICD-10-CM | POA: Diagnosis not present

## 2018-07-03 DIAGNOSIS — R7301 Impaired fasting glucose: Secondary | ICD-10-CM | POA: Diagnosis not present

## 2018-07-03 DIAGNOSIS — J449 Chronic obstructive pulmonary disease, unspecified: Secondary | ICD-10-CM | POA: Diagnosis not present

## 2018-07-17 ENCOUNTER — Other Ambulatory Visit: Payer: Self-pay | Admitting: General Surgery

## 2018-07-24 ENCOUNTER — Telehealth: Payer: Self-pay

## 2018-07-24 DIAGNOSIS — C50411 Malignant neoplasm of upper-outer quadrant of right female breast: Secondary | ICD-10-CM | POA: Diagnosis not present

## 2018-07-24 NOTE — Telephone Encounter (Signed)
Spoke with patient. She stated that she is having a procedure done on May 28th and wants to checked out before then. Offered to get her an appt with a NP but she refused. She stated that she had seen MW back in 2017 for something similar and she would like to see him again.   Was able to find an appt for May 27th at 945. Patient was ok with this appt.   Nothing further needed at time of call.

## 2018-07-31 ENCOUNTER — Ambulatory Visit (INDEPENDENT_AMBULATORY_CARE_PROVIDER_SITE_OTHER): Payer: Medicare Other

## 2018-07-31 ENCOUNTER — Other Ambulatory Visit: Payer: Self-pay

## 2018-07-31 ENCOUNTER — Ambulatory Visit (INDEPENDENT_AMBULATORY_CARE_PROVIDER_SITE_OTHER): Payer: Medicare Other | Admitting: Internal Medicine

## 2018-07-31 ENCOUNTER — Encounter: Payer: Self-pay | Admitting: Internal Medicine

## 2018-07-31 DIAGNOSIS — R0609 Other forms of dyspnea: Secondary | ICD-10-CM

## 2018-07-31 DIAGNOSIS — E039 Hypothyroidism, unspecified: Secondary | ICD-10-CM | POA: Diagnosis not present

## 2018-07-31 DIAGNOSIS — J4551 Severe persistent asthma with (acute) exacerbation: Secondary | ICD-10-CM

## 2018-07-31 DIAGNOSIS — I2510 Atherosclerotic heart disease of native coronary artery without angina pectoris: Secondary | ICD-10-CM | POA: Diagnosis not present

## 2018-07-31 DIAGNOSIS — R0600 Dyspnea, unspecified: Secondary | ICD-10-CM | POA: Diagnosis not present

## 2018-07-31 NOTE — Patient Instructions (Addendum)
Try omeprazole 20 mg x 2 x 30-60 min before your first meal  Symbicort 160 Take 2 puffs first thing in am and then another 2 puffs about 12 hours later.    Only use your albuterol as a rescue medication to be used if you can't catch your breath by resting or doing a relaxed purse lip breathing pattern.  - The less you use it, the better it will work when you need it. - Ok to use up to 2 puffs  every 4 hours if you must but call for immediate appointment if use goes up over your usual need - Don't leave home without it !!  (think of it like the spare tire for your car)   Please remember to go to the lab and x-ray department   for your tests - we will call you with the results when they are available.   Please schedule a follow up visit in 3 months but call sooner if needed  with all medications /inhalers/ solutions in hand so we can verify exactly what you are taking. This includes all medications from all doctors and over the counters

## 2018-07-31 NOTE — Progress Notes (Signed)
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 Asthma flare Chief Complaint  Patient presents with  . Acute Visit    Pt c/o cough, wheezing and increased SOB x 2 wks. She is coughing up clear sputum.   prev w/u showed bad reflux  maint at baseline symb 160 2 bid and no proair  02/24/16 called in 7 days of worse cough/ wheeze/sob> rx medrol not clear she took it Exp to [facility] sick prior to onset of cough rec Take 4 for three days 3 for three days 2 for three days 1 for three days and stop  Start Pantoprazole (protonix) 40 mg   Take  30-60 min before first meal of the day and Pepcid (famotidine)  20 mg one @  bedtime until return to office - this is the best way to tell whether stomach acid is contributing to your problem.      07/31/2018 acute extended ov/Tannie Koskela re:  Worse sob since   Nov 2018  Now on omeprazole 20mg  60 min ac  Early march 2020 worse sob despite symbicort 160 2 bid   No cough Sleeping on wedge  Room to room at home    No obvious day to day or daytime variability or assoc excess/ purulent sputum or mucus plugs or hemoptysis or cp or chest tightness, subjective wheeze or overt sinus or hb symptoms.   Sleeping  without nocturnal  or early am exacerbation  of respiratory  c/o's or need for noct saba. Also denies any obvious fluctuation of symptoms with weather or environmental changes or other aggravating or alleviating factors except as outlined above   No unusual exposure hx or h/o childhood pna/ asthma or knowledge of premature birth.  Current Allergies, Complete Past Medical  History, Past Surgical History, Family History, and Social History were reviewed in [employer] record.  ROS  The following are not active complaints unless bolded Hoarseness, sore throat, dysphagia, dental problems, itching, sneezing,  nasal congestion or discharge of excess mucus or purulent secretions, ear ache,   fever, chills, sweats, unintended wt loss or wt gain, classically pleuritic or exertional cp,  orthopnea pnd or arm/hand swelling  or leg swelling, presyncope, palpitations, abdominal pain, anorexia, nausea, vomiting, diarrhea  or change in bowel habits or change in bladder habits, change in stools or change in urine, dysuria, hematuria,  rash, arthralgias, visual complaints, headache, numbness, weakness or ataxia or problems with walking or coordination,  change in mood or  memory.        Current Meds  Medication Sig  . albuterol (PROVENTIL HFA;VENTOLIN HFA) 108 (90 Base) MCG/ACT inhaler INHALE 2 PUFFS INTO LUNGS EVERY 6 HOURS AS NEEDED FOR WHEEZING OR SHORTNESS OF BREATH  . albuterol (PROVENTIL) (2.5 MG/3ML) 0.083% nebulizer solution Take 3 mLs (2.5 mg total) by nebulization every 6 (six) hours as needed for wheezing or shortness of breath.  Marland Kitchen aspirin EC 81 MG EC tablet Take 1 tablet (81 mg total) by mouth daily.  . Ferrous Sulfate (IRON PO) Take 1 tablet by  mouth daily.  Marland Kitchen levothyroxine (SYNTHROID, LEVOTHROID) 137 MCG tablet Take 137 mcg by mouth daily before breakfast.  . nitroGLYCERIN (NITROSTAT) 0.4 MG SL tablet DISSOLVE ONE TABLET UNDER THE TONGUE EVERY 5 MINUTES AS NEEDED FOR CHEST PAIN. (CHEST PAIN OR SHORTNESS OF BREATH)  . rosuvastatin (CRESTOR) 5 MG tablet   . SYMBICORT 160-4.5 MCG/ACT inhaler Inhale 2 puffs by mouth twice daily                               Physical Exam:   07/31/2018       154   03/07/16 160 lb 12.8 oz (72.9 kg)  01/04/16 160 lb (72.6 kg)  04/26/15 159 lb 4.8 oz (72.3 kg)    Vital signs reviewed - Note on  arrival 02 sats  98% on RA    Elderly talkative wf, tends to go off on tangents instead of answering questions directly  HEENT: nl dentition, turbinates bilaterally, and oropharynx. Nl external ear canals without cough reflex   NECK :  without JVD/Nodes/TM/ nl carotid upstrokes bilaterally   LUNGS: no acc muscle use,  Nl contour chest with slightly distant bs bilaterally without cough on insp or exp maneuvers   CV:  RRR  no s3 or murmur or increase in P2, and no edema   ABD:  soft and nontender with nl inspiratory excursion in the supine position. No bruits or organomegaly appreciated, bowel sounds nl  MS:  Nl gait/ ext warm without deformities, calf tenderness, cyanosis or clubbing No obvious joint restrictions   SKIN: warm and dry without lesions    NEURO:  alert, approp, nl sensorium with  no motor or cerebellar deficits apparent.       CXR PA and Lateral:   07/31/2018 :    I personally reviewed images and agree with radiology impression as follows:   Large hiatal hernia stable from the prior exam.  No acute abnormality noted.   Labs ordered/ reviewed:      Chemistry      Component Value Date/Time   NA 142 07/31/2018 1600   K 4.5 07/31/2018 1600   CL 107 07/31/2018 1600   CO2 26 07/31/2018 1600   BUN 23 07/31/2018 1600   CREATININE 1.03 07/31/2018 1600      Component Value Date/Time   CALCIUM 8.9 07/31/2018 1600   ALKPHOS 66 06/04/2018 1432   AST 16 06/04/2018 1432   ALT 6 06/04/2018 1432   BILITOT 0.5 06/04/2018 1432   BILITOT 0.4 07/26/2017 1002        Lab Results  Component Value Date   WBC 8.5 07/31/2018   HGB 14.1 07/31/2018   HCT 42.0 07/31/2018   MCV 89.2 07/31/2018   PLT 129.0 (L) 07/31/2018       EOS                                                               0.1                                    07/31/2018   Lab Results  Component Value Date   DDIMER 0.73 (H) 07/31/2018  Lab Results  Component Value Date   TSH 0.10 (L)  07/31/2018     Lab Results  Component Value Date   PROBNP 196.0 (H) 07/31/2018         Labs ordered 07/31/2018   Allergy profile       Assessment/Plan:

## 2018-08-01 LAB — CBC WITH DIFFERENTIAL/PLATELET
Basophils Absolute: 0.1 10*3/uL (ref 0.0–0.1)
Basophils Relative: 0.9 % (ref 0.0–3.0)
Eosinophils Absolute: 0.1 10*3/uL (ref 0.0–0.7)
Eosinophils Relative: 1.4 % (ref 0.0–5.0)
HCT: 42 % (ref 36.0–46.0)
Hemoglobin: 14.1 g/dL (ref 12.0–15.0)
Lymphocytes Relative: 24.7 % (ref 12.0–46.0)
Lymphs Abs: 2.1 10*3/uL (ref 0.7–4.0)
MCHC: 33.5 g/dL (ref 30.0–36.0)
MCV: 89.2 fl (ref 78.0–100.0)
Monocytes Absolute: 0.6 10*3/uL (ref 0.1–1.0)
Monocytes Relative: 7 % (ref 3.0–12.0)
Neutro Abs: 5.6 10*3/uL (ref 1.4–7.7)
Neutrophils Relative %: 66 % (ref 43.0–77.0)
Platelets: 129 10*3/uL — ABNORMAL LOW (ref 150.0–400.0)
RBC: 4.71 Mil/uL (ref 3.87–5.11)
RDW: 17.6 % — ABNORMAL HIGH (ref 11.5–15.5)
WBC: 8.5 10*3/uL (ref 4.0–10.5)

## 2018-08-01 LAB — RESPIRATORY ALLERGY PROFILE REGION II ~~LOC~~
Allergen, A. alternata, m6: <0.1 kU/L
Allergen, Cedar tree, t12: <0.1 kU/L
Allergen, Comm Silver Birch, t9: <0.1 kU/L
Allergen, Cottonwood, t14: <0.1 kU/L
Allergen, D pternoyssinus,d7: <0.1 kU/L
Allergen, Mouse Urine Protein, e78: <0.1 kU/L
Allergen, Mulberry, t76: <0.1 kU/L
Allergen, Oak,t7: <0.1 kU/L
Allergen, P. notatum, m1: <0.1 kU/L
Aspergillus fumigatus, m3: <0.1 kU/L
Bermuda Grass: <0.1 kU/L
Box Elder IgE: <0.1 kU/L
CLADOSPORIUM HERBARUM (M2) IGE: <0.1 kU/L
COMMON RAGWEED (SHORT) (W1) IGE: <0.1 kU/L
Cat Dander: <0.1 kU/L
Class: 0
Class: 0
Class: 0
Class: 0
Class: 0
Class: 0
Class: 0
Class: 0
Class: 0
Class: 0
Class: 0
Class: 0
Class: 0
Class: 0
Class: 0
Class: 0
Class: 0
Class: 0
Class: 0
Class: 0
Class: 0
Class: 0
Class: 0
Class: 0
Cockroach: <0.1 kU/L
D. farinae: 0.12 kU/L — ABNORMAL HIGH
Dog Dander: <0.1 kU/L
Elm IgE: <0.1 kU/L
IgE (Immunoglobulin E), Serum: 11 kU/L (ref <=114)
Johnson Grass: <0.1 kU/L
Pecan/Hickory Tree IgE: <0.1 kU/L
Rough Pigweed  IgE: <0.1 kU/L
Sheep Sorrel IgE: <0.1 kU/L
Timothy Grass: <0.1 kU/L

## 2018-08-01 LAB — BASIC METABOLIC PANEL
BUN: 23 mg/dL (ref 6–23)
CO2: 26 mEq/L (ref 19–32)
Calcium: 8.9 mg/dL (ref 8.4–10.5)
Chloride: 107 mEq/L (ref 96–112)
Creatinine, Ser: 1.03 mg/dL (ref 0.40–1.20)
GFR: 51.58 mL/min — ABNORMAL LOW (ref >60.00)
Glucose, Bld: 98 mg/dL (ref 70–99)
Potassium: 4.5 mEq/L (ref 3.5–5.1)
Sodium: 142 mEq/L (ref 135–145)

## 2018-08-01 LAB — BRAIN NATRIURETIC PEPTIDE: Pro B Natriuretic peptide (BNP): 196 pg/mL — ABNORMAL HIGH (ref 0.0–100.0)

## 2018-08-01 LAB — TSH: TSH: 0.1 u[IU]/mL — ABNORMAL LOW (ref 0.35–4.50)

## 2018-08-01 LAB — D-DIMER, QUANTITATIVE: D-Dimer, Quant: 0.73 mcg/mL FEU — ABNORMAL HIGH (ref <0.50)

## 2018-08-01 LAB — INTERPRETATION:

## 2018-08-01 NOTE — Progress Notes (Signed)
Spoke with pt and notified of results per Dr. Wert. Pt verbalized understanding and denied any questions. 

## 2018-08-04 ENCOUNTER — Encounter: Payer: Self-pay | Admitting: Internal Medicine

## 2018-08-04 NOTE — Assessment & Plan Note (Signed)
Lab Results  Component Value Date   TSH 0.10 (L) 07/31/2018     Over treated at present and may be source of her unexplained doe so rec take supplement qod until checks with pcp   I had an extended discussion with the patient reviewing all relevant studies completed to date and  lasting 25 minutes of a 40  minute acute office visit with pt primarily not my pulmonary pt  re  severe non-specific but potentially very serious refractory respiratory symptoms of uncertain and potentially multiple  Etiologies.   See device teaching which extended face to face time for this visit      Each maintenance medication was reviewed in detail including most importantly the difference between maintenance and prns and under what circumstances the prns are to be triggered using an action plan format that is not reflected in the computer generated alphabetically organized AVS.    Please see AVS for specific instructions unique to this office visit that I personally wrote and verbalized to the the pt in detail and then reviewed with pt  by my nurse highlighting any changes in therapy/plan of care  recommended at today's visit.

## 2018-08-04 NOTE — Assessment & Plan Note (Signed)
Symptoms are markedly disproportionate to objective findings and not clear to what extent this is actually a pulmonary  problem but pt does appear to have difficult to sort out respiratory symptoms of unknown origin for which  DDX  = almost all start with A and  include Adherence, Ace Inhibitors, Acid Reflux, Active Sinus Disease, Alpha 1 Antitripsin deficiency, Anxiety masquerading as Airways dz,  ABPA,  Allergy(esp in young), Aspiration (esp in elderly), Adverse effects of meds,  Active smoking or Vaping, A bunch of PE's/clot burden (a few small clots can't cause this syndrome unless there is already severe underlying pulm or vascular dz with poor reserve),  Anemia or thyroid disorder, plus two Bs  = Bronchiectasis and Beta blocker use..and one C= CHF     Adherence is always the initial "prime suspect" and is a multilayered concern that requires a "trust but verify" approach in every patient - starting with knowing how to use medications, especially inhalers, correctly, keeping up with refills and understanding the fundamental difference between maintenance and prns vs those medications only taken for a very short course and then stopped and not refilled.  - see hfa teaching  - return with all meds in hand using a trust but verify approach to confirm accurate Medication  Reconciliation The principal here is that until we are certain that the  patients are doing what we've asked, it makes no sense to ask them to do more.    ? Acid (or non-acid) GERD > always difficult to exclude as up to 75% of pts in some series report no assoc GI/ Heartburn symptoms and she has a very large HH > rec max (24h)  acid suppression and diet restrictions/ reviewed and instructions given in writing.   ? Allergy >  Neg profile, no eos   ? Anxiety > usually at the bottom of this list of usual suspects but should be much higher on this pt's based on H and P and     may interfere with adherence and also interpretation of  response or lack thereof to symptom management which can be quite subjective.   ? Anemia or thyroid dz > hgb ok but tsh too low (see separate a/p)   ? A bunch of PE's > D dimer nl for age (< 1.0) - while a normal  or high normal value (seen commonly in the elderly or chronically ill)  may miss small peripheral pe, the clot burden with sob is moderately high and the d dimer  has a very high neg pred value if used in this setting.    ? chf > BNP in the indeterminate range but still on the low side and nothing to suggest by cxr or any symptoms to suggest IHD though hard to exclude completely

## 2018-08-04 NOTE — Assessment & Plan Note (Signed)
PFT's  07/30/11   FEV1  1.25 (61 % ) ratio 0.47  p 6 % improvement from saba p ? prior to study with DLCO  94 % and  corrects to 141  % for alv volume c classic f/v loop for  chronic asthma vs copd  - FENO 03/07/2016  =   9 on symb 160 2bid during acute flare - 07/31/2018  After extensive coaching inhaler device,  effectiveness =    75% > continue symb 160 2bid   C/w severe chronic asthma > copd as nl dlco and cxr not typical of copd now so key is to use symb consistently and correctly and addrress GERD aggressively and correct her tsh to nl (see separate a/p)

## 2018-08-05 ENCOUNTER — Telehealth: Payer: Self-pay | Admitting: Internal Medicine

## 2018-08-05 NOTE — Telephone Encounter (Signed)
Returned phone call to patient, she wanted to confirm what her TSH was. Read off number according to lab result. She is requesting a copy be sent to Dr. Debbora Presto. Phone # [phone], Fax [phone]. TSH faxed with confirmation. Also requesting labs be sent to her. Labs printed and placed upfront for outgoing mail. Nothing further is needed at this time.

## 2018-08-06 ENCOUNTER — Ambulatory Visit: Payer: Medicare Other | Admitting: Internal Medicine

## 2018-08-06 DIAGNOSIS — E039 Hypothyroidism, unspecified: Secondary | ICD-10-CM | POA: Diagnosis not present

## 2018-08-07 ENCOUNTER — Encounter (HOSPITAL_BASED_OUTPATIENT_CLINIC_OR_DEPARTMENT_OTHER): Payer: Self-pay | Admitting: *Deleted

## 2018-08-07 ENCOUNTER — Other Ambulatory Visit: Payer: Self-pay

## 2018-08-07 ENCOUNTER — Telehealth: Payer: Self-pay | Admitting: Cardiovascular Disease

## 2018-08-07 NOTE — Telephone Encounter (Signed)
Please comment on holding plavix for lumpectomy.

## 2018-08-07 NOTE — Progress Notes (Signed)
Reviewed this pts case with Dr. Marcell Barlow. Per him there is no need for any further cardiac clearance since she was last seen 05/23/18 and pt is canidate for [facility]. I did however call Dr. Camillia Herter office and spoke with Minette Headland to have message sent to his nurse to have him address when and if she could stop her Plavix for surgery. Minette Headland stated she would also go ahead and get cards clearance. Will wait for further instructions from cards.

## 2018-08-07 NOTE — Telephone Encounter (Signed)
New message      [facility] Pre-operative Risk Assessment    Request for surgical clearance:  1. What type of surgery is being performed? Lumpectomy of breast   2. When is this surgery scheduled? 08/13/2018  3. What type of clearance is required (medical clearance vs. Pharmacy clearance to hold med vs. Both)? medical  4. Are there any medications that need to be held prior to surgery and how long?Plavix   5. Practice name and name of physician performing surgery? Dr. Donne Hazel, outpatient surgery center of [facility]  6. What is your office phone number [phone]   7.   What is your office fax number [phone]  8.   Anesthesia type (None, local, MAC, general) ? general   Maryjane Hurter 08/07/2018, 11:57 AM  _________________________________________________________________   (provider comments below)

## 2018-08-08 NOTE — Telephone Encounter (Signed)
The patient was seen by Dr. Angelena Form 05/23/2018 and cleared for surgery as " She can achieve 4 METS. She is having no angina, signs of CHF or arrhythmias. She can proceed with her planned surgical procedure without further cardiac workup".    She continued to achieve > 4 mets of activity. Actually, Patient is not taking Plavix. She is on Aspirin 81mg  daily which is okay to hold for 5 days prior to surgery if needed.   Given past medical history and time since last visit, based on ACC/AHA guidelines, Sonia [name] would be at acceptable risk for the planned procedure without further cardiovascular testing.   I will route this recommendation to the requesting party via Epic fax function and remove from pre-op pool.  Please call with questions.  [place], [place] 08/08/2018, 8:52 AM

## 2018-08-08 NOTE — Telephone Encounter (Signed)
OK to hold Plavix for her planned surgical procedure.   Sonia [name]

## 2018-08-11 ENCOUNTER — Other Ambulatory Visit (HOSPITAL_COMMUNITY)
Admission: RE | Admit: 2018-08-11 | Discharge: 2018-08-11 | Disposition: A | Discharge status: Home / Self Care | Payer: Medicare Other | Source: Ambulatory Visit | Attending: General Surgery | Admitting: General Surgery

## 2018-08-11 ENCOUNTER — Other Ambulatory Visit: Payer: Self-pay

## 2018-08-11 DIAGNOSIS — Z1159 Encounter for screening for other viral diseases: Secondary | ICD-10-CM | POA: Insufficient documentation

## 2018-08-11 HISTORY — PX: BREAST LUMPECTOMY: SHX2

## 2018-08-12 ENCOUNTER — Ambulatory Visit
Admission: RE | Admit: 2018-08-12 | Discharge: 2018-08-12 | Disposition: A | Discharge status: Home / Self Care | Payer: Medicare Other | Source: Ambulatory Visit | Attending: General Surgery | Admitting: General Surgery

## 2018-08-12 DIAGNOSIS — C50911 Malignant neoplasm of unspecified site of right female breast: Secondary | ICD-10-CM | POA: Diagnosis not present

## 2018-08-12 DIAGNOSIS — C50411 Malignant neoplasm of upper-outer quadrant of right female breast: Secondary | ICD-10-CM

## 2018-08-12 DIAGNOSIS — Z170 Estrogen receptor positive status [ER+]: Secondary | ICD-10-CM

## 2018-08-12 NOTE — Progress Notes (Signed)
Ensure pre surgery drink given with instructions to complete by [place], pt verbalized understanding.

## 2018-08-13 ENCOUNTER — Other Ambulatory Visit: Payer: Self-pay

## 2018-08-13 ENCOUNTER — Ambulatory Visit (HOSPITAL_BASED_OUTPATIENT_CLINIC_OR_DEPARTMENT_OTHER)
Admission: RE | Admit: 2018-08-13 | Discharge: 2018-08-13 | Disposition: A | Discharge status: Home / Self Care | Payer: Medicare Other | Attending: General Surgery | Admitting: General Surgery

## 2018-08-13 ENCOUNTER — Ambulatory Visit (HOSPITAL_BASED_OUTPATIENT_CLINIC_OR_DEPARTMENT_OTHER): Payer: Medicare Other | Admitting: Certified Registered"

## 2018-08-13 ENCOUNTER — Encounter (HOSPITAL_BASED_OUTPATIENT_CLINIC_OR_DEPARTMENT_OTHER): Payer: Self-pay

## 2018-08-13 ENCOUNTER — Ambulatory Visit
Admission: RE | Admit: 2018-08-13 | Discharge: 2018-08-13 | Disposition: A | Discharge status: Home / Self Care | Payer: Medicare Other | Source: Ambulatory Visit | Attending: General Surgery | Admitting: General Surgery

## 2018-08-13 ENCOUNTER — Encounter (HOSPITAL_BASED_OUTPATIENT_CLINIC_OR_DEPARTMENT_OTHER)
Admission: RE | Disposition: A | Discharge status: Home / Self Care | Payer: Self-pay | Source: Home / Self Care | Attending: General Surgery

## 2018-08-13 DIAGNOSIS — Z8042 Family history of malignant neoplasm of prostate: Secondary | ICD-10-CM | POA: Insufficient documentation

## 2018-08-13 DIAGNOSIS — Z79899 Other long term (current) drug therapy: Secondary | ICD-10-CM | POA: Diagnosis not present

## 2018-08-13 DIAGNOSIS — Z825 Family history of asthma and other chronic lower respiratory diseases: Secondary | ICD-10-CM | POA: Diagnosis not present

## 2018-08-13 DIAGNOSIS — Z889 Allergy status to unspecified drugs, medicaments and biological substances status: Secondary | ICD-10-CM | POA: Insufficient documentation

## 2018-08-13 DIAGNOSIS — E039 Hypothyroidism, unspecified: Secondary | ICD-10-CM | POA: Insufficient documentation

## 2018-08-13 DIAGNOSIS — J449 Chronic obstructive pulmonary disease, unspecified: Secondary | ICD-10-CM | POA: Insufficient documentation

## 2018-08-13 DIAGNOSIS — Z888 Allergy status to other drugs, medicaments and biological substances status: Secondary | ICD-10-CM | POA: Insufficient documentation

## 2018-08-13 DIAGNOSIS — I2510 Atherosclerotic heart disease of native coronary artery without angina pectoris: Secondary | ICD-10-CM | POA: Insufficient documentation

## 2018-08-13 DIAGNOSIS — Z8719 Personal history of other diseases of the digestive system: Secondary | ICD-10-CM | POA: Diagnosis not present

## 2018-08-13 DIAGNOSIS — K449 Diaphragmatic hernia without obstruction or gangrene: Secondary | ICD-10-CM | POA: Insufficient documentation

## 2018-08-13 DIAGNOSIS — E559 Vitamin D deficiency, unspecified: Secondary | ICD-10-CM | POA: Insufficient documentation

## 2018-08-13 DIAGNOSIS — M1990 Unspecified osteoarthritis, unspecified site: Secondary | ICD-10-CM | POA: Diagnosis not present

## 2018-08-13 DIAGNOSIS — Z90710 Acquired absence of both cervix and uterus: Secondary | ICD-10-CM | POA: Diagnosis not present

## 2018-08-13 DIAGNOSIS — Z885 Allergy status to narcotic agent status: Secondary | ICD-10-CM | POA: Insufficient documentation

## 2018-08-13 DIAGNOSIS — E063 Autoimmune thyroiditis: Secondary | ICD-10-CM | POA: Diagnosis not present

## 2018-08-13 DIAGNOSIS — Z170 Estrogen receptor positive status [ER+]: Secondary | ICD-10-CM | POA: Diagnosis not present

## 2018-08-13 DIAGNOSIS — Z8249 Family history of ischemic heart disease and other diseases of the circulatory system: Secondary | ICD-10-CM | POA: Diagnosis not present

## 2018-08-13 DIAGNOSIS — Z91040 Latex allergy status: Secondary | ICD-10-CM | POA: Insufficient documentation

## 2018-08-13 DIAGNOSIS — K219 Gastro-esophageal reflux disease without esophagitis: Secondary | ICD-10-CM | POA: Diagnosis not present

## 2018-08-13 DIAGNOSIS — Z8269 Family history of other diseases of the musculoskeletal system and connective tissue: Secondary | ICD-10-CM | POA: Insufficient documentation

## 2018-08-13 DIAGNOSIS — R002 Palpitations: Secondary | ICD-10-CM | POA: Diagnosis not present

## 2018-08-13 DIAGNOSIS — Z880 Allergy status to penicillin: Secondary | ICD-10-CM | POA: Insufficient documentation

## 2018-08-13 DIAGNOSIS — C50911 Malignant neoplasm of unspecified site of right female breast: Secondary | ICD-10-CM | POA: Diagnosis not present

## 2018-08-13 DIAGNOSIS — Z803 Family history of malignant neoplasm of breast: Secondary | ICD-10-CM | POA: Insufficient documentation

## 2018-08-13 DIAGNOSIS — C50411 Malignant neoplasm of upper-outer quadrant of right female breast: Secondary | ICD-10-CM

## 2018-08-13 DIAGNOSIS — Z955 Presence of coronary angioplasty implant and graft: Secondary | ICD-10-CM | POA: Insufficient documentation

## 2018-08-13 DIAGNOSIS — G43809 Other migraine, not intractable, without status migrainosus: Secondary | ICD-10-CM | POA: Diagnosis not present

## 2018-08-13 DIAGNOSIS — Z91041 Radiographic dye allergy status: Secondary | ICD-10-CM | POA: Insufficient documentation

## 2018-08-13 DIAGNOSIS — Z808 Family history of malignant neoplasm of other organs or systems: Secondary | ICD-10-CM | POA: Insufficient documentation

## 2018-08-13 DIAGNOSIS — Z7982 Long term (current) use of aspirin: Secondary | ICD-10-CM | POA: Insufficient documentation

## 2018-08-13 DIAGNOSIS — C50211 Malignant neoplasm of upper-inner quadrant of right female breast: Secondary | ICD-10-CM | POA: Diagnosis not present

## 2018-08-13 DIAGNOSIS — Z7951 Long term (current) use of inhaled steroids: Secondary | ICD-10-CM | POA: Diagnosis not present

## 2018-08-13 DIAGNOSIS — I252 Old myocardial infarction: Secondary | ICD-10-CM | POA: Insufficient documentation

## 2018-08-13 DIAGNOSIS — Z806 Family history of leukemia: Secondary | ICD-10-CM | POA: Insufficient documentation

## 2018-08-13 DIAGNOSIS — Z800 Family history of malignant neoplasm of digestive organs: Secondary | ICD-10-CM | POA: Insufficient documentation

## 2018-08-13 DIAGNOSIS — Z801 Family history of malignant neoplasm of trachea, bronchus and lung: Secondary | ICD-10-CM | POA: Insufficient documentation

## 2018-08-13 DIAGNOSIS — Z820 Family history of epilepsy and other diseases of the nervous system: Secondary | ICD-10-CM | POA: Insufficient documentation

## 2018-08-13 DIAGNOSIS — J45909 Unspecified asthma, uncomplicated: Secondary | ICD-10-CM | POA: Diagnosis not present

## 2018-08-13 HISTORY — PX: BREAST LUMPECTOMY WITH RADIOACTIVE SEED LOCALIZATION: SHX6424

## 2018-08-13 LAB — NOVEL CORONAVIRUS, NAA (HOSP ORDER, SEND-OUT TO REF LAB; TAT 18-24 HRS): SARS-CoV-2, NAA: NOT DETECTED

## 2018-08-13 SURGERY — BREAST LUMPECTOMY WITH RADIOACTIVE SEED LOCALIZATION
Anesthesia: General | Site: Breast | Laterality: Right

## 2018-08-13 MED ORDER — FENTANYL CITRATE (PF) 100 MCG/2ML IJ SOLN: 25.0000 ug | INTRAMUSCULAR | Status: DC | PRN

## 2018-08-13 MED ORDER — PROPOFOL 500 MG/50ML IV EMUL
INTRAVENOUS | Status: DC | PRN
  Administered 2018-08-13: 25 ug/kg/min via INTRAVENOUS

## 2018-08-13 MED ORDER — FENTANYL CITRATE (PF) 100 MCG/2ML IJ SOLN
INTRAMUSCULAR | Status: DC | PRN
  Administered 2018-08-13: 50 ug via INTRAVENOUS
  Administered 2018-08-13 (×3): 25 ug via INTRAVENOUS

## 2018-08-13 MED ORDER — PROPOFOL 10 MG/ML IV BOLUS
INTRAVENOUS | Status: DC | PRN
  Administered 2018-08-13: 150 mg via INTRAVENOUS

## 2018-08-13 MED ORDER — CEFAZOLIN SODIUM-DEXTROSE 2-3 GM-%(50ML) IV SOLR
INTRAVENOUS | Status: DC | PRN
  Administered 2018-08-13: 2 g via INTRAVENOUS

## 2018-08-13 MED ORDER — GABAPENTIN 100 MG PO CAPS
ORAL_CAPSULE | ORAL | Status: AC
  Filled 2018-08-13: qty 1

## 2018-08-13 MED ORDER — ACETAMINOPHEN 500 MG PO TABS
ORAL_TABLET | ORAL | Status: AC
  Filled 2018-08-13: qty 2

## 2018-08-13 MED ORDER — LACTATED RINGERS IV SOLN
INTRAVENOUS | Status: DC | PRN
  Administered 2018-08-13: 13:00:00 via INTRAVENOUS

## 2018-08-13 MED ORDER — ONDANSETRON HCL 4 MG/2ML IJ SOLN
INTRAMUSCULAR | Status: AC
  Filled 2018-08-13: qty 2

## 2018-08-13 MED ORDER — BUPIVACAINE HCL (PF) 0.25 % IJ SOLN
INTRAMUSCULAR | Status: DC | PRN
  Administered 2018-08-13: 17 mL

## 2018-08-13 MED ORDER — ONDANSETRON HCL 4 MG/2ML IJ SOLN
INTRAMUSCULAR | Status: DC | PRN
  Administered 2018-08-13: 4 mg via INTRAVENOUS

## 2018-08-13 MED ORDER — GABAPENTIN 100 MG PO CAPS
100.0000 mg | ORAL_CAPSULE | ORAL | Status: AC
  Administered 2018-08-13: 100 mg via ORAL

## 2018-08-13 MED ORDER — LIDOCAINE HCL (CARDIAC) PF 100 MG/5ML IV SOSY
PREFILLED_SYRINGE | INTRAVENOUS | Status: DC | PRN
  Administered 2018-08-13: 80 mg via INTRAVENOUS

## 2018-08-13 MED ORDER — MEPERIDINE HCL 25 MG/ML IJ SOLN: 6.2500 mg | INTRAMUSCULAR | Status: DC | PRN

## 2018-08-13 MED ORDER — DEXAMETHASONE SODIUM PHOSPHATE 10 MG/ML IJ SOLN
INTRAMUSCULAR | Status: DC | PRN
  Administered 2018-08-13: 5 mg via INTRAVENOUS

## 2018-08-13 MED ORDER — ENSURE PRE-SURGERY PO LIQD: 296.0000 mL | Freq: Once | ORAL | Status: DC

## 2018-08-13 MED ORDER — PROPOFOL 10 MG/ML IV BOLUS
INTRAVENOUS | Status: AC
  Filled 2018-08-13: qty 40

## 2018-08-13 MED ORDER — CEFAZOLIN SODIUM-DEXTROSE 2-4 GM/100ML-% IV SOLN
INTRAVENOUS | Status: AC
  Filled 2018-08-13: qty 100

## 2018-08-13 MED ORDER — DEXAMETHASONE SODIUM PHOSPHATE 10 MG/ML IJ SOLN
INTRAMUSCULAR | Status: AC
  Filled 2018-08-13: qty 1

## 2018-08-13 MED ORDER — BUPIVACAINE HCL (PF) 0.25 % IJ SOLN
INTRAMUSCULAR | Status: AC
  Filled 2018-08-13: qty 30

## 2018-08-13 MED ORDER — FENTANYL CITRATE (PF) 100 MCG/2ML IJ SOLN
INTRAMUSCULAR | Status: AC
  Filled 2018-08-13: qty 2

## 2018-08-13 MED ORDER — OXYCODONE HCL 5 MG PO TABS
ORAL_TABLET | ORAL | Status: AC
  Filled 2018-08-13: qty 1

## 2018-08-13 MED ORDER — ACETAMINOPHEN 500 MG PO TABS
1000.0000 mg | ORAL_TABLET | ORAL | Status: AC
  Administered 2018-08-13: 1000 mg via ORAL

## 2018-08-13 MED ORDER — LIDOCAINE 2% (20 MG/ML) 5 ML SYRINGE
INTRAMUSCULAR | Status: AC
  Filled 2018-08-13: qty 5

## 2018-08-13 MED ORDER — CEFAZOLIN SODIUM-DEXTROSE 2-4 GM/100ML-% IV SOLN: 2.0000 g | INTRAVENOUS | Status: DC

## 2018-08-13 MED ORDER — OXYCODONE HCL 5 MG PO TABS
5.0000 mg | ORAL_TABLET | Freq: Once | ORAL | Status: AC
  Administered 2018-08-13: 5 mg via ORAL

## 2018-08-13 SURGICAL SUPPLY — 57 items
APPLIER CLIP 9.375 MED OPEN (MISCELLANEOUS)
BINDER BREAST LRG (GAUZE/BANDAGES/DRESSINGS) IMPLANT
BINDER BREAST MEDIUM (GAUZE/BANDAGES/DRESSINGS) IMPLANT
BINDER BREAST XLRG (GAUZE/BANDAGES/DRESSINGS) ×3 IMPLANT
BINDER BREAST XXLRG (GAUZE/BANDAGES/DRESSINGS) IMPLANT
BLADE SURG 15 STRL LF DISP TIS (BLADE) ×1 IMPLANT
BLADE SURG 15 STRL SS (BLADE) ×2
CANISTER SUC SOCK COL 7IN (MISCELLANEOUS) IMPLANT
CANISTER SUCT 1200ML W/VALVE (MISCELLANEOUS) IMPLANT
CHLORAPREP W/TINT 26 (MISCELLANEOUS) ×3 IMPLANT
CLIP APPLIE 9.375 MED OPEN (MISCELLANEOUS) IMPLANT
CLIP VESOCCLUDE SM WIDE 6/CT (CLIP) ×3 IMPLANT
CLOSURE WOUND 1/2 X4 (GAUZE/BANDAGES/DRESSINGS) ×1
COVER BACK TABLE REUSABLE LG (DRAPES) ×3 IMPLANT
COVER MAYO STAND REUSABLE (DRAPES) ×3 IMPLANT
COVER PROBE W GEL 5X96 (DRAPES) ×3 IMPLANT
COVER WAND RF STERILE (DRAPES) IMPLANT
DECANTER SPIKE VIAL GLASS SM (MISCELLANEOUS) IMPLANT
DERMABOND ADVANCED (GAUZE/BANDAGES/DRESSINGS) ×2
DERMABOND ADVANCED .7 DNX12 (GAUZE/BANDAGES/DRESSINGS) ×1 IMPLANT
DRAPE LAPAROSCOPIC ABDOMINAL (DRAPES) ×3 IMPLANT
DRAPE UTILITY XL STRL (DRAPES) ×3 IMPLANT
DRSG TEGADERM 4X4.75 (GAUZE/BANDAGES/DRESSINGS) IMPLANT
ELECT COATED BLADE 2.86 ST (ELECTRODE) ×3 IMPLANT
ELECT REM PT RETURN 9FT ADLT (ELECTROSURGICAL) ×3
ELECTRODE REM PT RTRN 9FT ADLT (ELECTROSURGICAL) ×1 IMPLANT
GAUZE SPONGE 4X4 12PLY STRL LF (GAUZE/BANDAGES/DRESSINGS) IMPLANT
GLOVE BIOGEL PI IND STRL 7.5 (GLOVE) ×3 IMPLANT
GLOVE BIOGEL PI INDICATOR 7.5 (GLOVE) ×6
GLOVE SURG SS PI 7.0 STRL IVOR (GLOVE) ×12 IMPLANT
GOWN STRL REUS W/ TWL LRG LVL3 (GOWN DISPOSABLE) ×2 IMPLANT
GOWN STRL REUS W/TWL LRG LVL3 (GOWN DISPOSABLE) ×4
HEMOSTAT ARISTA ABSORB 3G PWDR (HEMOSTASIS) IMPLANT
ILLUMINATOR WAVEGUIDE N/F (MISCELLANEOUS) IMPLANT
KIT MARKER MARGIN INK (KITS) ×3 IMPLANT
LIGHT WAVEGUIDE WIDE FLAT (MISCELLANEOUS) IMPLANT
NEEDLE HYPO 25X1 1.5 SAFETY (NEEDLE) ×3 IMPLANT
NS IRRIG 1000ML POUR BTL (IV SOLUTION) IMPLANT
PACK BASIN DAY SURGERY FS (CUSTOM PROCEDURE TRAY) ×3 IMPLANT
PENCIL BUTTON HOLSTER BLD 10FT (ELECTRODE) ×3 IMPLANT
SLEEVE SCD COMPRESS KNEE MED (MISCELLANEOUS) ×3 IMPLANT
SPONGE LAP 4X18 RFD (DISPOSABLE) ×3 IMPLANT
STRIP CLOSURE SKIN 1/2X4 (GAUZE/BANDAGES/DRESSINGS) ×2 IMPLANT
SUT MNCRL AB 4-0 PS2 18 (SUTURE) ×3 IMPLANT
SUT MON AB 5-0 PS2 18 (SUTURE) IMPLANT
SUT SILK 2 0 SH (SUTURE) ×3 IMPLANT
SUT VIC AB 2-0 SH 27 (SUTURE) ×4
SUT VIC AB 2-0 SH 27XBRD (SUTURE) ×2 IMPLANT
SUT VIC AB 3-0 SH 27 (SUTURE) ×2
SUT VIC AB 3-0 SH 27X BRD (SUTURE) ×1 IMPLANT
SUT VIC AB 5-0 PS2 18 (SUTURE) IMPLANT
SYR CONTROL 10ML LL (SYRINGE) ×3 IMPLANT
TOWEL GREEN STERILE FF (TOWEL DISPOSABLE) ×3 IMPLANT
TRAY FAXITRON CT DISP (TRAY / TRAY PROCEDURE) ×3 IMPLANT
TUBE CONNECTING 20'X1/4 (TUBING) ×1
TUBE CONNECTING 20X1/4 (TUBING) ×2 IMPLANT
YANKAUER SUCT BULB TIP NO VENT (SUCTIONS) ×3 IMPLANT

## 2018-08-13 NOTE — Anesthesia Procedure Notes (Signed)
Procedure Name: LMA Insertion Performed by: Verita Lamb, CRNA Pre-anesthesia Checklist: Patient identified, Suction available, Emergency Drugs available, Patient being monitored and Timeout performed Patient Re-evaluated:Patient Re-evaluated prior to induction Oxygen Delivery Method: Circle system utilized Preoxygenation: Pre-oxygenation with 100% oxygen Induction Type: IV induction LMA: LMA inserted LMA Size: 4.0 Tube type: Oral Number of attempts: 1 Placement Confirmation: CO2 detector,  positive ETCO2 and breath sounds checked- equal and bilateral Tube secured with: Tape Dental Injury: Teeth and Oropharynx as per pre-operative assessment

## 2018-08-13 NOTE — Discharge Instructions (Signed)
[facility] °Office Phone Number [phone] ° °POST OP INSTRUCTIONS °Take 400 mg of ibuprofen every 8 hours or 650 mg tylenol every 6 hours for next 72 hours then as needed. Use ice several times daily also. °Always review your discharge instruction sheet given to you by the facility where your surgery was performed. ° °IF YOU HAVE DISABILITY OR FAMILY LEAVE FORMS, YOU MUST BRING THEM TO THE OFFICE FOR PROCESSING.  DO NOT GIVE THEM TO YOUR DOCTOR. ° °1. A prescription for pain medication may be given to you upon discharge.  Take your pain medication as prescribed, if needed.  If narcotic pain medicine is not needed, then you may take acetaminophen (Tylenol), naprosyn (Alleve) or ibuprofen (Advil) as needed. °2. Take your usually prescribed medications unless otherwise directed °3. If you need a refill on your pain medication, please contact your pharmacy.  They will contact our office to request authorization.  Prescriptions will not be filled after 5pm or on week-ends. °4. You should eat very light the first 24 hours after surgery, such as soup, crackers, pudding, etc.  Resume your normal diet the day after surgery. °5. Most patients will experience some swelling and bruising in the breast.  Ice packs and a good support bra will help.  Wear the breast binder provided or a sports bra for 72 hours day and night.  After that wear a sports bra during the day until you return to the office. Swelling and bruising can take several days to resolve.  °6. It is common to experience some constipation if taking pain medication after surgery.  Increasing fluid intake and taking a stool softener will usually help or prevent this problem from occurring.  A mild laxative (Milk of Magnesia or Miralax) should be taken according to package directions if there are no bowel movements after 48 hours. °7. Unless discharge instructions indicate otherwise, you may remove your bandages 48 hours after surgery and you may  shower at that time.  You may have steri-strips (small skin tapes) in place directly over the incision.  These strips should be left on the skin for 7-10 days and will come off on their own.  If your surgeon used skin glue on the incision, you may shower in 24 hours.  The glue will flake off over the next 2-3 weeks.  Any sutures or staples will be removed at the office during your follow-up visit. °8. ACTIVITIES:  You may resume regular daily activities (gradually increasing) beginning the next day.  Wearing a good support bra or sports bra minimizes pain and swelling.  You may have sexual intercourse when it is comfortable. °a. You may drive when you no longer are taking prescription pain medication, you can comfortably wear a seatbelt, and you can safely maneuver your car and apply brakes. °b. RETURN TO WORK:  ______________________________________________________________________________________ °9. You should see your doctor in the office for a follow-up appointment approximately two weeks after your surgery.  Your doctor’s nurse will typically make your follow-up appointment when she calls you with your pathology report.  Expect your pathology report 3-4 business days after your surgery.  You may call to check if you do not hear from us after three days. °10. OTHER INSTRUCTIONS: _______________________________________________________________________________________________ _____________________________________________________________________________________________________________________________________ °_____________________________________________________________________________________________________________________________________ °_____________________________________________________________________________________________________________________________________ ° °WHEN TO CALL DR WAKEFIELD: °1. Fever over 101.0 °2. Nausea and/or vomiting. °3. Extreme swelling or bruising. °4. Continued bleeding from  incision. °5. Increased pain, redness, or drainage from the incision. ° °The clinic staff is available to   answer your questions during regular business hours.  Please dont hesitate to call and ask to speak to one of the nurses for clinical concerns.  If you have a medical emergency, go to the nearest emergency room or call 911.  A surgeon from [facility] is always on call at the hospital.  For further questions, please visit [email or web address]    Post Anesthesia Home Care Instructions  NO TYLENOL OR IBUPROFEN UNTIL AFTER 630PM 08/13/18.  Activity: Get plenty of rest for the remainder of the day. A responsible individual must stay with you for 24 hours following the procedure.  For the next 24 hours, DO NOT: -Drive a car -Paediatric nurse -Drink alcoholic beverages -Take any medication unless instructed by your physician -Make any legal decisions or sign important papers.  Meals: Start with liquid foods such as gelatin or soup. Progress to regular foods as tolerated. Avoid greasy, spicy, heavy foods. If nausea and/or vomiting occur, drink only clear liquids until the nausea and/or vomiting subsides. Call your physician if vomiting continues.  Special Instructions/Symptoms: Your throat may feel dry or sore from the anesthesia or the breathing tube placed in your throat during surgery. If this causes discomfort, gargle with warm salt water. The discomfort should disappear within 24 hours.  If you had a scopolamine patch placed behind your ear for the management of post- operative nausea and/or vomiting:  1. The medication in the patch is effective for 72 hours, after which it should be removed.  Wrap patch in a tissue and discard in the trash. Wash hands thoroughly with soap and water. 2. You may remove the patch earlier than 72 hours if you experience unpleasant side effects which may include dry mouth, dizziness or visual disturbances. 3. Avoid touching the  patch. Wash your hands with soap and water after contact with the patch.

## 2018-08-13 NOTE — Anesthesia Preprocedure Evaluation (Signed)
Anesthesia Evaluation  Patient identified by MRN, date of birth, ID band Patient awake    Reviewed: Allergy & Precautions, H&P , NPO status , Patient's Chart, lab work & pertinent test results, reviewed documented beta blocker date and time   Airway Mallampati: II  TM Distance: >3 FB Neck ROM: full    Dental no notable dental hx.    Pulmonary asthma , pneumonia, COPD,    Pulmonary exam normal breath sounds clear to auscultation       Cardiovascular + CAD, + Past MI and + DOE   Rhythm:regular Rate:Normal     Neuro/Psych  Headaches, negative psych ROS   GI/Hepatic Neg liver ROS, hiatal hernia, GERD  ,  Endo/Other  Hypothyroidism   Renal/GU Renal disease     Musculoskeletal  (+) Arthritis ,   Abdominal   Peds  Hematology negative hematology ROS (+)   Anesthesia Other Findings COPD with asthma  Palpitations Acute chest pain Severe asthma with acute exacerbation NSTEMI (non-ST elevated myocardial infarction) ([place]) CAD S/P percutaneous coronary angioplasty    Reproductive/Obstetrics                             Anesthesia Physical  Anesthesia Plan  ASA: III  Anesthesia Plan: General   Post-op Pain Management:    Induction: Intravenous  PONV Risk Score and Plan: 4 or greater and Ondansetron, Dexamethasone and Treatment may vary due to age or medical condition  Airway Management Planned: LMA  Additional Equipment: None  Intra-op Plan:   Post-operative Plan: Extubation in OR  Informed Consent: I have reviewed the patients History and Physical, chart, labs and discussed the procedure including the risks, benefits and alternatives for the proposed anesthesia with the patient or authorized representative who has indicated his/her understanding and acceptance.     Dental advisory given  Plan Discussed with: CRNA  Anesthesia Plan Comments:         Anesthesia Quick  Evaluation

## 2018-08-13 NOTE — Op Note (Signed)
Preoperative diagnosis: Clinical stage I right breast cancer, ER positive Postoperative diagnosis: Same as above Procedure: Right breast seed guided lumpectomy Surgeon: Dr. Serita Grammes Anesthesia: General Estimated blood loss: Minimal Complications: None Drains: None Specimens: 1.  Right breast lumpectomy marked with paint 2.  Additional posterior, lateral, inferior margins marked with short stitch superior, long stitch lateral, double stitch deep Sponge and needle count was correct at completion Disposition to recovery in stable condition  Indications: This is a 78 year old female who has multiple medical problems and is a retired [employer] scientist.  She presented in early March with a mammographically found less than 1 cm ER positive cancer.  Due to the pandemic we elected to proceed with antiestrogen therapy at that time.  She is actually tolerating this very well.  Once we were able to get through that have the ability to operate I saw her again we decided to proceed.  She had a radioactive seed placed prior to beginning.  Procedure: After informed consent was obtained the patient was taken to the operating room.  She was given antibiotics.  SCDs were in place.  She was placed under general anesthesia without complication.  She was prepped and draped in the standard sterile surgical fashion.  A surgical timeout was then performed.  I located the seed in the right lower outer quadrant.  I elected to make a curvilinear incision overlying the seed as it appeared it was fairly close to the skin.  I infiltrated Marcaine and made this incision.  I then carried it down to the seed.  Her breast tissue was very mobile.  It felt like the seed was actually moving around to some degree in a cavity as well.  I excised the seed and the clip with an attempt to get a rim of normal tissue around it.  Hemostasis was then obtained.  I did a 2D mammogram confirming the removal of the clip and the seed.  I did a 3D view and  this showed that I looked that I was close to the 3 margins that I then ended up excising.  I think this was due to the mobility of the seed.  I excised these margins and marked them as above.  I then placed clips in the cavity.  I closed the breast tissue with 2-0 Vicryl.  The skin was closed with 3-0 Vicryl for Monocryl.  Glue and Steri-Strips were applied.  She tolerated this well was extubated and transferred to recovery stable.

## 2018-08-13 NOTE — H&P (Signed)
Sonia [name] is an 78 y.o. female.   Chief Complaint: breast cancer HPI:  62 yof referred by Dr Sonia [name] for new right breast cancer. she has history of left breast adh excised with seed guidance in 2017. saw Dr Sonia [name] postop and elected to just follow this. 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 she has no mass or dc. had screening mm that showed c density breasts. right breast has distortion. left breast normal. US shows a 7x6x4 mm mass. adenopathy is negative. biopsy was done showing grade I idc arising in csl that is er/pr pos, her 2 negative with low Ki. she has nstemi a year or so ago. due to covid started tamoxifen with oncology. she is doing well, no real side effects.    Past Medical History:  Diagnosis Date  . Arthritis   . Asthma    daily and prn inhalers; states good control currently  . CAD S/P percutaneous coronary angioplasty 07/15/2016  . COPD (chronic obstructive pulmonary disease) ([facility])   . GERD (gastroesophageal reflux disease)    no current med.  . GI bleed 07/25/2016   Brilinta changed to Plavix  . Hashimoto's thyroiditis   . Headache    allergies; silent migraines  . Hiatal hernia   . Hypothyroidism   . Pneumonia   . 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 Vitamin D deficiency     Past Surgical History:  Procedure Laterality Date  . ABDOMINAL HYSTERECTOMY     partial  . APPENDECTOMY    . BREAST BIOPSY Left   . BREAST CYST EXCISION Left   . BREAST EXCISIONAL BIOPSY Left   . BREAST LUMPECTOMY WITH RADIOACTIVE SEED LOCALIZATION Left 04/07/2015   Procedure: BREAST LUMPECTOMY WITH RADIOACTIVE SEED LOCALIZATION;  Surgeon: Sonia Luna, MD;  Location: [facility];  Service: General;  Laterality: Left;  . CORONARY STENT INTERVENTION N/A 07/15/2016   Procedure: Coronary Stent Intervention;  Surgeon: Sonia Mocha, MD;  Location: [facility];  Service: Cardiovascular;  Laterality: N/A;  .  ESOPHAGOGASTRODUODENOSCOPY (EGD) WITH PROPOFOL Left 07/25/2016   Procedure: ESOPHAGOGASTRODUODENOSCOPY (EGD) WITH PROPOFOL;  Surgeon: Sonia Juniper, MD;  Location: [facility];  Service: Gastroenterology;  Laterality: Left;  . KNEE ARTHROSCOPY Right 12/07/2003  . LAPAROSCOPIC SALPINGO OOPHERECTOMY Right 09/08/2003  . LEFT HEART CATH N/A 07/15/2016   Procedure: Left Heart Cath;  Surgeon: Sonia Mocha, MD;  Location: [facility];  Service: Cardiovascular;  Laterality: N/A;  . LYSIS OF ADHESION  09/08/2003  . NASAL SINUS SURGERY  age 43  . RECTAL POLYPECTOMY  10/02/2006    Family History  Problem Relation Age of Onset  . Asthma Mother   . Heart disease Mother   . Asthma Sister   . Heart disease Sister   . Breast cancer Sister 40  . Heart disease Father   . Kidney disease Father   . Prostate cancer Brother        dx. 73s  . Heart attack Brother 34  . Alzheimer's disease Brother 63  . Breast cancer Maternal Aunt 70  . Stomach cancer Maternal Grandfather 60  . Asthma Sister   . Memory loss Sister   . Heart attack Son 59  . Spinal muscular atrophy Grandchild        type II  . Cancer Other 35       niece dx. ca of salivary gland   . Breast cancer Other 40  niece; negative genetic testing 10 years ago  . Melanoma Other   . Alzheimer's disease Maternal Aunt   . Cervical cancer Cousin        maternal 1st cousin dx. at 3  . Breast cancer Cousin 57       maternal 1st cousin  . Lung cancer Cousin 47       maternal 1st cousin; smoker  . Heart disease Cousin   . Leukemia Cousin 14       paternal 1st cousin  . Breast cancer Other        paternal great grandmother (PGF's mother) dx. late 76s-early 97s  . Coronary artery disease Other    Social History:  reports that she has never smoked. She has never used smokeless tobacco. She reports previous alcohol use. She reports that she does not use drugs.  Allergies:  Allergies  Allergen Reactions  . Adenosine Shortness Of  Breath  . Contrast Media [Iodinated Diagnostic Agents] Anaphylaxis  . Codeine Sulfate Nausea And Vomiting  . Amoxicillin-Pot Clavulanate Other (See Comments)    Felt like my pressure dropped  . Latex Other (See Comments)    Skin irritation  . Adhesive [Tape] Other (See Comments)    SKIN IRRITATION    Medications Prior to Admission  Medication Sig Dispense Refill  . albuterol (PROVENTIL HFA;VENTOLIN HFA) 108 (90 Base) MCG/ACT inhaler INHALE 2 PUFFS INTO LUNGS EVERY 6 HOURS AS NEEDED FOR WHEEZING OR SHORTNESS OF BREATH 27 g 0  . aspirin EC 81 MG EC tablet Take 1 tablet (81 mg total) by mouth daily.    . cetirizine (ZYRTEC) 10 MG chewable tablet Chew 10 mg by mouth daily.    . Ferrous Sulfate (IRON PO) Take 1 tablet by mouth daily.    Marland Kitchen levothyroxine (SYNTHROID, LEVOTHROID) 137 MCG tablet Take 137 mcg by mouth daily before breakfast.    . omeprazole (PRILOSEC) 20 MG capsule Take 20 mg by mouth daily.    . rosuvastatin (CRESTOR) 5 MG tablet     . SYMBICORT 160-4.5 MCG/ACT inhaler Inhale 2 puffs by mouth twice daily 33 g 0  . albuterol (PROVENTIL) (2.5 MG/3ML) 0.083% nebulizer solution Take 3 mLs (2.5 mg total) by nebulization every 6 (six) hours as needed for wheezing or shortness of breath. 75 mL 0  . nitroGLYCERIN (NITROSTAT) 0.4 MG SL tablet DISSOLVE ONE TABLET UNDER THE TONGUE EVERY 5 MINUTES AS NEEDED FOR CHEST PAIN. (CHEST PAIN OR SHORTNESS OF BREATH) 25 tablet 8    No results found for this or any previous visit (from the past 48 hour(s)). Mm Rt Radioactive Seed Loc Mammo Guide  Addendum Date: 08/12/2018   ADDENDUM REPORT: 08/12/2018 15:56 ADDENDUM: Please note that the radioactive seed lies 5 mm INFERIOR to the RIBBON clip. Electronically Signed   By: Margarette Canada M.D.   On: 08/12/2018 15:56   Result Date: 08/12/2018 CLINICAL DATA:  78 year old female for radioactive seed localization of RIGHT breast cancer prior to lumpectomy. EXAM: MAMMOGRAPHIC GUIDED RADIOACTIVE SEED LOCALIZATION OF  THE RIGHT BREAST COMPARISON:  Previous exam(s). FINDINGS: Patient presents for radioactive seed localization prior to RIGHT lumpectomy. I met with the patient and we discussed the procedure of seed localization including benefits and alternatives. We discussed the high likelihood of a successful procedure. We discussed the risks of the procedure including infection, bleeding, tissue injury and further surgery. We discussed the low dose of radioactivity involved in the procedure. Informed, written consent was given. The usual time-out protocol was performed immediately  prior to the procedure. Using mammographic guidance, sterile technique, 1% lidocaine and an I-125 radioactive seed, the RIBBON clip was localized using a LATERAL approach. The follow-up mammogram images confirm the seed in the expected location and were marked for Dr. Donne Hazel. Follow-up survey of the patient confirms presence of the radioactive seed. Order number of I-125 seed:  [id number]. Total activity:  9.432 millicuries.  Reference Date: 06/19/2018 The patient tolerated the procedure well and was released from the [facility]. She was given instructions regarding seed removal. IMPRESSION: Radioactive seed localization RIGHT breast. No apparent complications. Electronically Signed: By: Margarette Canada M.D. On: 08/12/2018 15:36    Review of Systems  Respiratory: Positive for shortness of breath.   All other systems reviewed and are negative.   Blood pressure 134/76, pulse 72, temperature 98 F (36.7 C), temperature source Oral, resp. rate 16, height '5\' 4"'$  (1.626 m), weight 70.6 kg, SpO2 100 %. Physical Exam  General Mental Status-Alert.  Head and Neck Trachea-midline. Thyroid Gland Characteristics - normal size and consistency.  Eye Sclera/Conjunctiva - Bilateral-No scleral icterus.  Chest and Lung Exam Chest and lung exam reveals -normal excursion with symmetric chest walls and quiet, even and easy respiratory effort  with no use of accessory muscles.  Breast Nipples-No Discharge. Breast Lump-No Palpable Breast Mass.  Cardiovascular Cardiovascular examination reveals -normal heart sounds, regular rate and rhythm with no murmurs.  Abdomen Note: soft no hm, nontender   Neurologic Neurologic evaluation reveals -alert and oriented x 3 with no impairment of recent or remote memory.  Lymphatic Head & Neck  General Head & Neck Lymphatics: Bilateral - Description - Normal. Axillary  General Axillary Region: Bilateral - Description - Normal. Note: no [place] adenopathy A/P:  Right breast seed guided lumpectomy I dont think needs sn biopsy given grade 1 8 mm tumor that is er pos at age 34. we discussed this and she is agreeable. will stop tamoxifen 7 days prior We discussed the options for treatment of the breast cancer which included lumpectomy versus a mastectomy. We discussed the performance of the lumpectomy with radioactive seed placement. We discussed a 5% chance of a positive margin requiring reexcision in the operating room. We also discussed that she might need radiation therapy if she undergoes lumpectomy but could forego it. We discussed mastectomy and the postoperative care for that as well. Mastectomy can be followed by reconstruction. The decision for lumpectomy vs mastectomy has no impact on decision for chemotherapy. Most mastectomy patients will not need radiation therapy. We discussed that there is no difference in her survival whether she undergoes lumpectomy with radiation therapy or antiestrogen therapy versus a mastectomy. There is also no real difference between her recurrence in the breast. We discussed the risks of operation including bleeding, infection, possible reoperation. She understands her further therapy will be based on what her stages at the time of her operation. Rolm Bookbinder, MD 08/13/2018, 12:44 PM

## 2018-08-13 NOTE — Interval H&P Note (Signed)
History and Physical Interval Note:  08/13/2018 12:46 PM  Sonia [name]  has presented today for surgery, with the diagnosis of RIGHT BREAST CANCER.  The various methods of treatment have been discussed with the patient and family. After consideration of risks, benefits and other options for treatment, the patient has consented to  Procedure(s): RIGHT BREAST LUMPECTOMY WITH RADIOACTIVE SEED LOCALIZATION (Right) as a surgical intervention.  The patient's history has been reviewed, patient examined, no change in status, stable for surgery.  I have reviewed the patient's chart and labs.  Questions were answered to the patient's satisfaction.     Rolm Bookbinder

## 2018-08-13 NOTE — Anesthesia Postprocedure Evaluation (Signed)
Anesthesia Post Note  Patient: Environmental consultant  Procedure(s) Performed: RIGHT BREAST LUMPECTOMY WITH RADIOACTIVE SEED LOCALIZATION (Right Breast)     Patient location during evaluation: PACU Anesthesia Type: General Level of consciousness: sedated and patient cooperative Pain management: pain level controlled Vital Signs Assessment: post-procedure vital signs reviewed and stable Respiratory status: spontaneous breathing Cardiovascular status: stable Anesthetic complications: no    Last Vitals:  Vitals:   08/13/18 1430 08/13/18 1452  BP: (!) 155/76 (!) 160/80  Pulse: 68 83  Resp: 12 18  Temp:  36.8 C  SpO2: 97% 96%    Last Pain:  Vitals:   08/13/18 1452  TempSrc:   PainSc: 0-No pain                 Nolon Nations

## 2018-08-13 NOTE — Transfer of Care (Signed)
Immediate Anesthesia Transfer of Care Note  Patient: Sonia [name]  Procedure(s) Performed: RIGHT BREAST LUMPECTOMY WITH RADIOACTIVE SEED LOCALIZATION (Right Breast)  Patient Location: PACU  Anesthesia Type:General  Level of Consciousness: awake, alert  and oriented  Airway & Oxygen Therapy: Patient Spontanous Breathing and Patient connected to face mask oxygen  Post-op Assessment: Report given to RN and Post -op Vital signs reviewed and stable  Post vital signs: Reviewed and stable  Last Vitals:  Vitals Value Taken Time  BP    Temp    Pulse 74 08/13/2018  1:43 PM  Resp 12 08/13/2018  1:43 PM  SpO2 100 % 08/13/2018  1:43 PM  Vitals shown include unvalidated device data.  Last Pain:  Vitals:   08/13/18 1206  TempSrc: Oral  PainSc: 0-No pain         Complications: No apparent anesthesia complications

## 2018-08-15 ENCOUNTER — Encounter (HOSPITAL_BASED_OUTPATIENT_CLINIC_OR_DEPARTMENT_OTHER): Payer: Self-pay | Admitting: General Surgery

## 2018-08-18 ENCOUNTER — Other Ambulatory Visit: Payer: Self-pay | Admitting: Oncology

## 2018-08-18 ENCOUNTER — Encounter: Payer: Self-pay | Admitting: Oncology

## 2018-09-03 ENCOUNTER — Other Ambulatory Visit: Payer: Self-pay | Admitting: Oncology

## 2018-09-04 ENCOUNTER — Telehealth: Payer: Self-pay | Admitting: Oncology

## 2018-09-04 NOTE — Progress Notes (Signed)
[place]  Telephone:(336) [phone] Fax:(336) [phone]     ID: Sonia [name] DOB: [birth year]  MR#: [id number]  RWE#:[id number]  Patient Care Team: [name], MD as PCP - General (Internal Medicine) Burnell Blanks, MD as PCP - Cardiology (Cardiology) Eppie Gibson, MD as Consulting Physician (Radiation Oncology) Magrinat, Virgie Dad, MD as Consulting Physician (Oncology) Chesley Mires, MD as Consulting Physician (Pulmonary Disease) Rolm Bookbinder, MD as Consulting Physician (General Surgery) Chauncey Cruel, MD OTHER MD:  CHIEF COMPLAINT: Estrogen receptor positive breast cancer  CURRENT TREATMENT: Tamoxifen   I connected with Sonia [name] on 09/05/18 at 11:30 AM EDT by telephone visit and verified that I am speaking with the correct person using two identifiers.   I discussed the limitations, risks, security and privacy concerns of performing an evaluation and management service by telemedicine and the availability of in-person appointments. I also discussed with the patient that there may be a patient responsible charge related to this service. The patient expressed understanding and agreed to proceed.   Other persons participating in the visit and their role in the encounter:   - Biomedical scientist, Medical Scribe   Patient's location: Home  Provider's location: [place]   Chief Complaint: Estrogen receptor positive breast cancer     HISTORY OF CURRENT ILLNESS: From the original intake note:  "Sonia [name]" had routine screening mammography on 04/23/2018 showing a possible abnormality in the right breast. She underwent bilateral diagnostic mammography with tomography and right breast ultrasonography at [facility] on 04/30/2018 showing: 0.7 cm mass with associated architectural distortion in the 9 o'clock position of the right breast; normal appearing right axillary lymph nodes.  Accordingly on 05/02/2018 she  proceeded to biopsy of the right breast area in question. The pathology from this procedure ([id number]) showed: invasive ductal carcinoma, with background complex sclerosing lesion, grade 1. Prognostic indicators significant for: estrogen receptor, 95% positive and progesterone receptor, 90% positive, both with strong staining intensity. Proliferation marker Ki67 at 10%. HER2 negative (1+).   Of note, she underwent left breast lumpectomy on 04/07/2015, with pathology ([id number]) showing lobular neoplasia (atypical lobular hyperplasia). She saw Dr. Lindi Adie following this procedure and opted for active surveillance at that time. She did undergo genetic testing on 05/10/2015 with negative results.  The patient's subsequent history is as detailed below.   INTERVAL HISTORY: Collie is contacted today for follow-up and treatment of her estrogen receptor positive breast cancer.   She continues on tamoxifen. She is having hot flashes.and some night diaphoresis, but it has been manageable. She denies any problem with vaginal wetness.  She stopped the tamoxifen a few days prior to the surgery and has not yet resumed it because she wants to make sure "everything is okay" before starting that medication.  She has also been reading up on the possible side effects and that is a concern to her  Since her last visit here, she underwent a right lumpectomy on 08/13/2018. The pathology from this procedure showed ([id number]): 1. Breast, lumpectomy, right breast tissue with seed - invasive ductal carcinoma, nottingham grade 1 of 3, 0.7 cm, arising in a complex sclerosing lesion - margins uninvolved by carcinoma (<0.1 cm; inferior margin, see part #4) - previous biopsy site changes present - see oncology table below 2. Breast, excision, right breast additional lateral margin - no residual carcinoma identified 3. Breast, excision, right breast additional posterior margin - no residual carcinoma identified 4. Breast,  excision, right breast additional margin   -  no residual carcinoma identified     REVIEW OF SYSTEMS: Kumari says that she took one oxycodone before she left the hospital following her surgery, but she has not needed pain relievers since. She has some mild tightness at her surgical site, but otherwise has a good range of motion. She is exercising in her house three times per week . She is having difficulty with acid reflux. The patient denies unusual headaches, visual changes, nausea, vomiting, or dizziness. There has been no unusual cough, phlegm production, or pleurisy. This been no change in bowel or bladder habits. The patient denies unexplained fatigue or unexplained weight loss, bleeding, rash, or fever. A detailed review of systems was otherwise noncontributory.    PAST MEDICAL HISTORY: Past Medical History:  Diagnosis Date  . Arthritis   . Asthma    daily and prn inhalers; states good control currently  . CAD S/P percutaneous coronary angioplasty 07/15/2016  . COPD (chronic obstructive pulmonary disease) ([facility])   . GERD (gastroesophageal reflux disease)    no current med.  . GI bleed 07/25/2016   Brilinta changed to Plavix  . Hashimoto's thyroiditis   . Headache    allergies; silent migraines  . Hiatal hernia   . Hypothyroidism   . Pneumonia   . 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 Vitamin D deficiency   NSTEMI 07/13/2016   PAST SURGICAL HISTORY: Past Surgical History:  Procedure Laterality Date  . ABDOMINAL HYSTERECTOMY     partial  . APPENDECTOMY    . BREAST BIOPSY Left   . BREAST CYST EXCISION Left   . BREAST EXCISIONAL BIOPSY Left   . BREAST LUMPECTOMY WITH RADIOACTIVE SEED LOCALIZATION Left 04/07/2015   Procedure: BREAST LUMPECTOMY WITH RADIOACTIVE SEED LOCALIZATION;  Surgeon: Erroll Luna, MD;  Location: [place];  Service: General;  Laterality: Left;  . BREAST LUMPECTOMY WITH RADIOACTIVE SEED LOCALIZATION Right 08/13/2018   Procedure:  RIGHT BREAST LUMPECTOMY WITH RADIOACTIVE SEED LOCALIZATION;  Surgeon: Rolm Bookbinder, MD;  Location: [place];  Service: General;  Laterality: Right;  . CORONARY STENT INTERVENTION N/A 07/15/2016   Procedure: Coronary Stent Intervention;  Surgeon: Sherren Mocha, MD;  Location: [facility];  Service: Cardiovascular;  Laterality: N/A;  . ESOPHAGOGASTRODUODENOSCOPY (EGD) WITH PROPOFOL Left 07/25/2016   Procedure: ESOPHAGOGASTRODUODENOSCOPY (EGD) WITH PROPOFOL;  Surgeon: Ronnette Juniper, MD;  Location: [place];  Service: Gastroenterology;  Laterality: Left;  . KNEE ARTHROSCOPY Right 12/07/2003  . LAPAROSCOPIC SALPINGO OOPHERECTOMY Right 09/08/2003  . LEFT HEART CATH N/A 07/15/2016   Procedure: Left Heart Cath;  Surgeon: Sherren Mocha, MD;  Location: [facility];  Service: Cardiovascular;  Laterality: N/A;  . LYSIS OF ADHESION  09/08/2003  . NASAL SINUS SURGERY  age 59  . RECTAL POLYPECTOMY  10/02/2006    FAMILY HISTORY Family History  Problem Relation Age of Onset  . Asthma Mother   . Heart disease Mother   . Asthma Sister   . Heart disease Sister   . Breast cancer Sister 81  . Heart disease Father   . Kidney disease Father   . Prostate cancer Brother        dx. 28s  . Heart attack Brother 9  . Alzheimer's disease Brother 30  . Breast cancer Maternal Aunt 70  . Stomach cancer Maternal Grandfather 60  . Asthma Sister   . Memory loss Sister   . Heart attack Son 72  . Spinal muscular atrophy Grandchild  type II  . Cancer Other 58       niece dx. ca of salivary gland   . Breast cancer Other 27       niece; negative genetic testing 10 years ago  . Melanoma Other   . Alzheimer's disease Maternal Aunt   . Cervical cancer Cousin        maternal 1st cousin dx. at 41  . Breast cancer Cousin 52       maternal 1st cousin  . Lung cancer Cousin 41       maternal 1st cousin; smoker  . Heart disease Cousin   . Leukemia Cousin 14       paternal 1st  cousin  . Breast cancer Other        paternal great grandmother (PGF's mother) dx. late 76s-early 13s  . Coronary artery disease Other    Patient's father was 45 years old when he died from kidney disease. Patient's mother died from heart disease at age 74. The patient has 3 siblings, 2 sisters and 1 brother. She denies a family hx of ovarian cancer. She reports her sister was diagnosed with breast cancer at age 7, a meternal aunt at age 27, a maternal 1st cousin at age 43, a niece at age 46, and PGF's mother in her late 79s - early 45s. She also notes a family hx of other cancers, including prostate cancer in her brother, cervical cancer in a maternal 1st cousin, lung cancer in a paternal 1st cousin (smoker), salivary gland cancer in a niece, and melanoma in a niece.  GYNECOLOGIC HISTORY:  No LMP recorded. Patient has had a hysterectomy. Menarche: 78 years old Age at first live birth: 78 years old [place] P 2 LMP age 18 Contraceptive no HRT no  Hysterectomy? Yes, at age 72. BSO? 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 She is divorced/widowed. [She and her husband divorced, and he has since passed away.] She lives at home with her dog, Abby. She had a son, Marya Amsler, pass away at age 47 in 2005 from a heart attack. Son Cay Schillings, age 12, lives in [place] and runs his own plumbing business. Marya Amsler had two girls, fraternal twins age 73. Sudie Bailey has one daughter, age 84.  The patient attends [employer].     ADVANCED DIRECTIVES: not in place; my: At the 06/04/2018 visit the patient was provided the appropriate forms to complete and notarize at her discretion   HEALTH MAINTENANCE: Social History   Tobacco Use  . Smoking status: Never Smoker  . Smokeless tobacco: Never Used  Substance Use Topics  . Alcohol use: Not Currently    Comment: rarely  . Drug use: No     Colonoscopy: ~2015, Dr.  Wallis Mart, repeat in 10 years  PAP: 2019, negative  Bone density: 04/23/2018, T-score of -1.7   Allergies  Allergen Reactions  . Adenosine Shortness Of Breath  . Contrast Media [Iodinated Diagnostic Agents] Anaphylaxis  . Codeine Sulfate Nausea And Vomiting  . Amoxicillin-Pot Clavulanate Other (See Comments)    Felt like my pressure dropped  . Latex Other (See Comments)    Skin irritation  . Adhesive [Tape] Other (See Comments)    SKIN IRRITATION    Current Outpatient Medications  Medication Sig Dispense Refill  . albuterol (PROVENTIL HFA;VENTOLIN HFA) 108 (90 Base) MCG/ACT inhaler INHALE 2 PUFFS INTO LUNGS EVERY 6 HOURS AS NEEDED FOR WHEEZING OR  SHORTNESS OF BREATH 27 g 0  . albuterol (PROVENTIL) (2.5 MG/3ML) 0.083% nebulizer solution Take 3 mLs (2.5 mg total) by nebulization every 6 (six) hours as needed for wheezing or shortness of breath. 75 mL 0  . aspirin EC 81 MG EC tablet Take 1 tablet (81 mg total) by mouth daily.    . cetirizine (ZYRTEC) 10 MG chewable tablet Chew 10 mg by mouth daily.    . Ferrous Sulfate (IRON PO) Take 1 tablet by mouth daily.    Marland Kitchen levothyroxine (SYNTHROID, LEVOTHROID) 137 MCG tablet Take 137 mcg by mouth daily before breakfast.    . nitroGLYCERIN (NITROSTAT) 0.4 MG SL tablet DISSOLVE ONE TABLET UNDER THE TONGUE EVERY 5 MINUTES AS NEEDED FOR CHEST PAIN. (CHEST PAIN OR SHORTNESS OF BREATH) 25 tablet 8  . omeprazole (PRILOSEC) 20 MG capsule Take 20 mg by mouth daily.    . rosuvastatin (CRESTOR) 5 MG tablet     . SYMBICORT 160-4.5 MCG/ACT inhaler Inhale 2 puffs by mouth twice daily 33 g 0  . tamoxifen (NOLVADEX) 20 MG tablet Take 1 tablet (20 mg total) by mouth daily for 30 days. 90 tablet 12   No current facility-administered medications for this visit.     OBJECTIVE: Older white woman in no acute distress  There were no vitals filed for this visit.   There is no height or weight on file to calculate BMI.   Wt Readings from Last 3 Encounters:  08/13/18  155 lb 10.3 oz (70.6 kg)  07/31/18 154 lb 12.8 oz (70.2 kg)  06/04/18 153 lb 12.8 oz (69.8 kg)      ECOG FS:1 - Symptomatic but completely ambulatory   LAB RESULTS:  CMP     Component Value Date/Time   NA 142 07/31/2018 1600   K 4.5 07/31/2018 1600   CL 107 07/31/2018 1600   CO2 26 07/31/2018 1600   GLUCOSE 98 07/31/2018 1600   BUN 23 07/31/2018 1600   CREATININE 1.03 07/31/2018 1600   CALCIUM 8.9 07/31/2018 1600   PROT 7.1 06/04/2018 1432   PROT 6.2 07/26/2017 1002   ALBUMIN 3.8 06/04/2018 1432   ALBUMIN 4.1 07/26/2017 1002   AST 16 06/04/2018 1432   ALT 6 06/04/2018 1432   ALKPHOS 66 06/04/2018 1432   BILITOT 0.5 06/04/2018 1432   BILITOT 0.4 07/26/2017 1002   GFRNONAA 52 (L) 06/04/2018 1432   GFRAA >60 06/04/2018 1432    No results found for: TOTALPROTELP, ALBUMINELP, A1GS, A2GS, BETS, BETA2SER, GAMS, MSPIKE, SPEI  No results found for: KPAFRELGTCHN, LAMBDASER, KAPLAMBRATIO  Lab Results  Component Value Date   WBC 8.5 07/31/2018   NEUTROABS 5.6 07/31/2018   HGB 14.1 07/31/2018   HCT 42.0 07/31/2018   MCV 89.2 07/31/2018   PLT 129.0 (L) 07/31/2018    '@LASTCHEMISTRY'$ @  No results found for: LABCA2  No components found for: HQPRFF638  No results for input(s): INR in the last 168 hours.  No results found for: LABCA2  No results found for: GYK599  No results found for: JTT017  No results found for: BLT903  No results found for: CA2729  No components found for: HGQUANT  No results found for: CEA1 / No results found for: CEA1   No results found for: AFPTUMOR  No results found for: CHROMOGRNA  No results found for: PSA1  No visits with results within 3 Day(s) from this visit.  Latest known visit with results is:  Hospital Outpatient Visit on 08/11/2018  Component Date Value Ref  Range Status  . SARS-CoV-2, NAA 08/11/2018 NOT DETECTED  NOT DETECTED Final   Comment: (NOTE) This test was developed and its performance characteristics determined  by [employer]. This test has not been FDA cleared or approved. This test has been authorized by FDA under an Emergency Use Authorization (EUA). This test is only authorized for the duration of time the declaration that circumstances exist justifying the authorization of the emergency use of in vitro diagnostic tests for detection of SARS-CoV-2 virus and/or diagnosis of COVID-19 infection under section 564(b)(1) of the Act, 21 U.S.C. 673ALP-3(X)(9), unless the authorization is terminated or revoked sooner. When diagnostic testing is negative, the possibility of a false negative result should be considered in the context of a patient's recent exposures and the presence of clinical signs and symptoms consistent with COVID-19. An individual without symptoms of COVID-19 and who is not shedding SARS-CoV-2 virus would expect to have a negative (not detected) result in this assay. Performed                           At: [facility] [place], [place] [id number] Rush Farmer MD GD:[id number]   . Coronavirus Source 08/11/2018 NASOPHARYNGEAL   Final   Performed at [facility], [place] [address] [place], [place] [address]    (this displays the last labs from the last 3 days)  No results found for: TOTALPROTELP, ALBUMINELP, A1GS, A2GS, BETS, BETA2SER, GAMS, MSPIKE, SPEI (this displays SPEP labs)  No results found for: KPAFRELGTCHN, LAMBDASER, KAPLAMBRATIO (kappa/lambda light chains)  No results found for: HGBA, HGBA2QUANT, HGBFQUANT, HGBSQUAN (Hemoglobinopathy evaluation)   No results found for: LDH  Lab Results  Component Value Date   IRON 99 06/04/2018   TIBC 316 06/04/2018   IRONPCTSAT 31 06/04/2018   (Iron and TIBC)  Lab Results  Component Value Date   FERRITIN 19 06/04/2018    Urinalysis    Component Value Date/Time   COLORURINE YELLOW (A) 09/22/2017 0819   APPEARANCEUR CLEAR (A) 09/22/2017 0819   LABSPEC 1.010 09/22/2017 0819    PHURINE 7.5 09/22/2017 0819   GLUCOSEU NEGATIVE 09/22/2017 0819   GLUCOSEU NEGATIVE 11/28/2010 [place] 09/22/2017 0819   BILIRUBINUR NEGATIVE 09/22/2017 0819   KETONESUR NEGATIVE 09/22/2017 0819   PROTEINUR NEGATIVE 09/22/2017 0819   UROBILINOGEN 0.2 11/28/2010 1422   NITRITE NEGATIVE 09/22/2017 0819   LEUKOCYTESUR SMALL (A) 09/22/2017 0819     STUDIES: Mm Breast Surgical Specimen  Result Date: 08/13/2018 CLINICAL DATA:  Status post excision of the right breast following radioactive seed localization of a right breast carcinoma. EXAM: SPECIMEN RADIOGRAPH OF THE RIGHT BREAST COMPARISON:  Previous exam(s). FINDINGS: Status post excision of the right breast. The radioactive seed and biopsy marker clip are present, completely intact, and were marked for pathology. IMPRESSION: Specimen radiograph of the right breast. Electronically Signed   By: Lajean Manes M.D.   On: 08/13/2018 13:21   Mm Rt Radioactive Seed Loc Mammo Guide  Addendum Date: 08/12/2018   ADDENDUM REPORT: 08/12/2018 15:56 ADDENDUM: Please note that the radioactive seed lies 5 mm INFERIOR to the RIBBON clip. Electronically Signed   By: Margarette Canada M.D.   On: 08/12/2018 15:56   Result Date: 08/12/2018 CLINICAL DATA:  78 year old female for radioactive seed localization of RIGHT breast cancer prior to lumpectomy. EXAM: MAMMOGRAPHIC GUIDED RADIOACTIVE SEED LOCALIZATION OF THE RIGHT BREAST COMPARISON:  Previous exam(s). FINDINGS: Patient presents for radioactive seed localization  prior to RIGHT lumpectomy. I met with the patient and we discussed the procedure of seed localization including benefits and alternatives. We discussed the high likelihood of a successful procedure. We discussed the risks of the procedure including infection, bleeding, tissue injury and further surgery. We discussed the low dose of radioactivity involved in the procedure. Informed, written consent was given. The usual time-out protocol was performed  immediately prior to the procedure. Using mammographic guidance, sterile technique, 1% lidocaine and an I-125 radioactive seed, the RIBBON clip was localized using a LATERAL approach. The follow-up mammogram images confirm the seed in the expected location and were marked for Dr. Donne Hazel. Follow-up survey of the patient confirms presence of the radioactive seed. Order number of I-125 seed:  [id number]. Total activity:  4.536 millicuries.  Reference Date: 06/19/2018 The patient tolerated the procedure well and was released from the [name]. She was given instructions regarding seed removal. IMPRESSION: Radioactive seed localization RIGHT breast. No apparent complications. Electronically Signed: By: Margarette Canada M.D. On: 08/12/2018 15:36    ELIGIBLE FOR AVAILABLE RESEARCH PROTOCOL: no  ASSESSMENT: 78 y.o. [place] woman status post right breast upper outer quadrant biopsy 05/02/2018 for a clinical T1b N0, stage IA invasive ductal carcinoma, grade 1, estrogen and progesterone receptor positive, HER-2 nonamplified, with an MIB-1 of 10%  (1) no adjuvant radiation planned (discussed 06/04/2018: See note)  (2) tamoxifen started 06/04/2018  (a) bone density at the breast center 04/23/2018 shows a T score of -1.7  (b) patient is status post remote hysterectomy and salpingo-oophorectomy  (3) genetics testing 05/17/2015 through the custom Cancer panel through [facility] found no deleterious mutations in ATM, BARD1, BRCA1, BRCA2, BRIP1, CDH1, CDK4, CDKN2A, CHEK2, EPCAM, FANCC, MLH1, MSH2, MSH6, NBN, PALB2, PMS2, PTEN, RAD51C, RAD51D, TP53, and XRCC2.   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  She tells me she has a good range of motion on that side and no swelling.  She is doing extensive exercises 3 times a day and is planning to do some water exercises as well.  She is wondering if at her age and with such good prognostic tumor she needs to take tamoxifen.  She most likely would do fine with observation only, but the fact is that we are not giving her radiation on the expectation that she would take tamoxifen for 5 years.  She understands that either tamoxifen or the radiation would reduce the risk of local recurrence by one half, and also that tamoxifen will cut in half her risk of developing another breast cancer.  Generally if she is expected to live another 10 years, which I think is not unreasonable in her case, my recommendation is she take this medication which costs about $6 a month and that is likely to cause her a few hot flashes but otherwise should be beneficial.  Of course if she does develop some discomfort from it she can stop it at that point.  Recall she is status post hysterectomy so "concerns regarding the uterus are not an issue.  She is aware of  the risk of blood clots which is low in her case  Her main problem right now is reflux.  We reviewed reflux precautions.  She is taking omeprazole.  She could double up on that.  She continues to work with Dr. Cristina Gong regarding that.  I am going to see her again March of next year after her mammography in February of next year.  Hopefully by then she will have had her coronavirus vaccine  She knows to call for any other issue that may develop before the next visit.  I spent approximately 25 minutes face to face with [facility] with more than 50% of that time spent in counseling and coordination of care.   Magrinat, Virgie Dad, MD  09/05/18 11:52 AM Medical Oncology and Hematology [facility] [address] [place], [place] [address] Tel. [phone]    Fax. [phone]  I, Jacqualyn Posey am acting as a Education administrator for Chauncey Cruel, MD.   I, Lurline Del MD, have reviewed the above documentation for accuracy and completeness, and I agree with the above.

## 2018-09-04 NOTE — Telephone Encounter (Signed)
Called pt per 6/25 sch message - able to reach pt . Pt is aware of appt date and time

## 2018-09-05 ENCOUNTER — Inpatient Hospital Stay: Payer: Medicare Other | Attending: Oncology | Admitting: Oncology

## 2018-09-05 DIAGNOSIS — C50411 Malignant neoplasm of upper-outer quadrant of right female breast: Secondary | ICD-10-CM

## 2018-09-05 DIAGNOSIS — Z7982 Long term (current) use of aspirin: Secondary | ICD-10-CM | POA: Diagnosis not present

## 2018-09-05 DIAGNOSIS — Z170 Estrogen receptor positive status [ER+]: Secondary | ICD-10-CM

## 2018-09-05 DIAGNOSIS — Z79810 Long term (current) use of selective estrogen receptor modulators (SERMs): Secondary | ICD-10-CM | POA: Diagnosis not present

## 2018-09-05 DIAGNOSIS — M8580 Other specified disorders of bone density and structure, unspecified site: Secondary | ICD-10-CM

## 2018-09-05 DIAGNOSIS — I2510 Atherosclerotic heart disease of native coronary artery without angina pectoris: Secondary | ICD-10-CM

## 2018-09-05 DIAGNOSIS — Z79899 Other long term (current) drug therapy: Secondary | ICD-10-CM | POA: Diagnosis not present

## 2018-09-05 MED ORDER — TAMOXIFEN CITRATE 20 MG PO TABS: 20.0000 mg | ORAL_TABLET | Freq: Every day | ORAL | 12 refills | Status: AC

## 2018-09-08 ENCOUNTER — Telehealth: Payer: Self-pay | Admitting: Oncology

## 2018-09-08 NOTE — Telephone Encounter (Signed)
I talk with patient regarding schedule  

## 2018-09-14 NOTE — Progress Notes (Signed)
Reviewed and agree with assessment/plan.   Caress Reffitt, MD [facility] 03/07/2016, 12:24 PM Pager:  [phone]  

## 2018-09-17 DIAGNOSIS — E039 Hypothyroidism, unspecified: Secondary | ICD-10-CM | POA: Diagnosis not present

## 2018-09-22 ENCOUNTER — Other Ambulatory Visit: Payer: Self-pay | Admitting: Pulmonary Disease

## 2018-09-26 ENCOUNTER — Other Ambulatory Visit: Payer: Self-pay | Admitting: *Deleted

## 2018-09-26 MED ORDER — SYMBICORT 160-4.5 MCG/ACT IN AERO: 2.0000 | INHALATION_SPRAY | Freq: Two times a day (BID) | RESPIRATORY_TRACT | 3 refills | Status: DC

## 2018-11-05 ENCOUNTER — Other Ambulatory Visit: Payer: Self-pay

## 2018-11-05 ENCOUNTER — Ambulatory Visit (INDEPENDENT_AMBULATORY_CARE_PROVIDER_SITE_OTHER): Payer: Medicare Other | Admitting: Internal Medicine

## 2018-11-05 ENCOUNTER — Encounter: Payer: Self-pay | Admitting: Internal Medicine

## 2018-11-05 DIAGNOSIS — J45901 Unspecified asthma with (acute) exacerbation: Secondary | ICD-10-CM

## 2018-11-05 DIAGNOSIS — E039 Hypothyroidism, unspecified: Secondary | ICD-10-CM | POA: Diagnosis not present

## 2018-11-05 DIAGNOSIS — I2510 Atherosclerotic heart disease of native coronary artery without angina pectoris: Secondary | ICD-10-CM

## 2018-11-05 MED ORDER — BUDESONIDE-FORMOTEROL FUMARATE 160-4.5 MCG/ACT IN AERO: 2.0000 | INHALATION_SPRAY | Freq: Two times a day (BID) | RESPIRATORY_TRACT | 0 refills | Status: DC

## 2018-11-05 NOTE — Patient Instructions (Addendum)
Please remember to go to the lab department   for your tests - we will call you with the results when they are available.  Work on inhaler technique:  relax and gently blow all the way out then take a nice smooth deep breath back in, triggering the inhaler at same time you start breathing in.  Hold for up to 5 seconds if you can. Blow symbicort 160 out thru nose. Rinse and gargle with water when done      Please schedule a follow up visit in 6 months but call sooner if needed  with all medications /inhalers/ solutions in hand so we can verify exactly what you are taking. This includes all medications from all doctors and over the counters

## 2018-11-05 NOTE — Progress Notes (Addendum)
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 Asthma flare Chief Complaint  Patient presents with  . Acute Visit    Pt c/o cough, wheezing and increased SOB x 2 wks. She is coughing up clear sputum.   prev w/u showed bad reflux  maint at baseline symb 160 2 bid and no proair  02/24/16 called in 7 days of worse cough/ wheeze/sob> rx medrol not clear she took it Exp to ggchild sick prior to onset of cough rec Take 4 for three days 3 for three days 2 for three days 1 for three days and stop  Start Pantoprazole (protonix) 40 mg   Take  30-60 min before first meal of the day and Pepcid (famotidine)  20 mg one @  bedtime until return to office - this is the best way to tell whether stomach acid is contributing to your problem.      07/31/2018 acute extended ov/Shakeisha Horine re:  Worse sob since   Nov 2018  Now on omeprazole 20mg  60 min ac  Early march 2020 worse sob despite symbicort 160 2 bid   No cough Sleeping on wedge  Room to room at home  rec Try omeprazole 20 mg x 2 x 30-60 min before your first meal Symbicort 160 Take 2 puffs first thing in am and then another 2 puffs about 12 hours later.  Only use your albuterol as a rescue medication  Please schedule a follow up visit in 3 months but call sooner if needed  with all medications /inhalers/ solutions in hand so we can verify exactly what you are taking. This includes all medications from all doctors and over the counters   11/05/2018  f/u ov/Eliah Marquard re: chronic asthma on ppi 20 mg qd and symb 160 2bid / no meds Chief Complaint  Patient presents with  .  Follow-up    Pt states her breathing has been some worse with the humid weather. She has used her albuterol inhaler 2 x since the last visit.   Dyspnea:  Aerobics/ swimming now again but doesn't do well out in heat/ humidity  Cough: no  Sleeping: no resp issues   SABA use: min hfa/ no neb 02: no   No obvious day to day or daytime variability or assoc excess/ purulent sputum or mucus plugs or hemoptysis or cp or chest tightness, subjective wheeze or overt sinus or hb symptoms.   Sleeping  without nocturnal  or early am exacerbation  of respiratory  c/o's or need for noct saba. Also denies any obvious fluctuation of symptoms with weather or environmental changes or other aggravating or alleviating factors except as outlined above   No unusual exposure hx or h/o childhood pna/ asthma or knowledge of premature birth.  Current Allergies, Complete Past Medical History, Past Surgical History, Family History, and Social History were reviewed in [employer] record.  ROS  The following are not active complaints unless bolded Hoarseness, sore throat, dysphagia, dental problems, itching, sneezing,  nasal congestion or discharge of excess mucus or purulent secretions, ear ache,  fever, chills, sweats, unintended wt loss or wt gain, classically pleuritic or exertional cp,  orthopnea pnd or arm/hand swelling  or leg swelling, presyncope, palpitations, abdominal pain, anorexia, nausea, vomiting, diarrhea  or change in bowel habits or change in bladder habits, change in stools or change in urine, dysuria, hematuria,  rash, arthralgias, visual complaints, headache, numbness, weakness or ataxia or problems with walking or coordination,  change in mood or  memory.       List not accurate or complete  - see meds out  Current Meds  Medication Sig  . SYNTHROID 112 MCG tablet Take 1 tablet by mouth daily.  . tamoxifen (NOLVADEX) 20 MG tablet Take 1 tablet by mouth daily.                                Physical Exam:  11/05/2018       152  07/31/2018       154   03/07/16 160 lb 12.8 oz (72.9 kg)  01/04/16 160 lb (72.6 kg)  04/26/15 159 lb 4.8 oz (72.3 kg)    Vital signs reviewed - Note on arrival 02 sats  967% on RA       HEENT: nl dentition / oropharynx. Nl external ear canals without cough reflex -  Mild bilateral non-specific turbinate edema     NECK :  without JVD/Nodes/TM/ nl carotid upstrokes bilaterally   LUNGS: no acc muscle use,  Mild barrel  contour chest wall with bilateral  Distant bs s audible wheeze and  without cough on insp or exp maneuvers  and mild  Hyperresonant  to  percussion bilaterally     CV:  RRR  no s3 or murmur or increase in P2, and no edema   ABD:  soft and nontender with pos end  insp Hoover's  in the supine position. No bruits or organomegaly appreciated, bowel sounds nl  MS:   Nl gait/  ext warm without deformities, calf tenderness, cyanosis or clubbing No obvious joint restrictions   SKIN: warm and dry without lesions    NEURO:  alert, approp, nl sensorium with  no motor or cerebellar deficits apparent.              Labs ordered 11/05/2018  :     alpha one AT phenotype               Assessment/Plan:   Outpatient Encounter Medications as of 11/05/2018  Medication Sig  . SYNTHROID 112 MCG tablet Take 1 tablet by mouth daily.  . tamoxifen (NOLVADEX) 20 MG tablet Take 1 tablet by mouth daily.  Marland Kitchen albuterol (PROVENTIL HFA;VENTOLIN HFA) 108 (90 Base) MCG/ACT inhaler INHALE 2 PUFFS INTO LUNGS EVERY 6 HOURS AS NEEDED FOR WHEEZING OR SHORTNESS OF BREATH  . albuterol (PROVENTIL) (2.5 MG/3ML) 0.083% nebulizer solution Take 3 mLs (2.5 mg total) by nebulization every 6 (six) hours as needed for wheezing or shortness of breath.  . budesonide-formoterol (SYMBICORT) 160-4.5 MCG/ACT inhaler Inhale 2 puffs into the lungs 2 (two) times daily.  Marland Kitchen levothyroxine (SYNTHROID, LEVOTHROID) 137 MCG tablet Take 137 mcg by  mouth daily before breakfast.  . nitroGLYCERIN (NITROSTAT) 0.4 MG SL tablet DISSOLVE ONE TABLET UNDER THE TONGUE EVERY 5 MINUTES AS NEEDED FOR CHEST PAIN. (CHEST PAIN OR SHORTNESS OF BREATH)  . omeprazole (PRILOSEC) 20 MG capsule Take 20 mg by mouth daily.  . rosuvastatin (CRESTOR) 5 MG tablet   .     .     .    Marland Kitchen

## 2018-11-07 ENCOUNTER — Encounter: Payer: Self-pay | Admitting: Internal Medicine

## 2018-11-07 NOTE — Addendum Note (Signed)
Addended by: Christinia Gully B on: 11/07/2018 06:09 AM   Modules accepted: Orders

## 2018-11-07 NOTE — Assessment & Plan Note (Addendum)
TSH 0.78 07/13/16-  on Synthroid chronically rx per PCP  Lab Results  Component Value Date   TSH 0.10 (L) 07/31/2018       Appears euthyroid> PCP adjusting down synthroid now @ 112 mcg/day

## 2018-11-07 NOTE — Assessment & Plan Note (Signed)
PFT's  07/30/11   FEV1  1.25 (61 % ) ratio 0.47  p 6 % improvement from saba p ? prior to study with DLCO  94 % and  corrects to 141  % for alv volume c classic f/v loop for  chronic asthma    - FENO 03/07/2016  =   9 on symb 160 2bid during acute flare - 07/31/2018     75% > continue symb 160 2bid  - 11/05/2018  After extensive coaching inhaler device,  effectiveness =    75% (short ti)   Other than poor tol of heat/ humidity, All goals of chronic asthma control met including optimal (though clearly abn) function and elimination of symptoms with minimal need for rescue therapy.  Contingencies discussed in full including contacting this office immediately if not controlling the symptoms using the rule of two's.  I had an extended discussion with the patient reviewing all relevant studies completed to date and  lasting 15 to 20 minutes of a 25 minute visit    I performed detailed device teaching using a teach back method which extended face to face time for this visit (see above)  Each maintenance medication was reviewed in detail including emphasizing most importantly the difference between maintenance and prns and under what circumstances the prns are to be triggered using an action plan format that is not reflected in the computer generated alphabetically organized AVS which I have not found useful in most complex patients, especially with respiratory illnesses  Please see AVS for specific instructions unique to this visit that I personally wrote and verbalized to the the pt in detail and then reviewed with pt  by my nurse highlighting any  changes in therapy recommended at today's visit to their plan of care.

## 2018-11-09 LAB — ALPHA-1 ANTITRYPSIN PHENOTYPE: A-1 Antitrypsin, Ser: 179 mg/dL (ref 83–199)

## 2018-12-07 NOTE — Progress Notes (Signed)
No show

## 2018-12-08 ENCOUNTER — Inpatient Hospital Stay: Payer: Medicare Other | Attending: Oncology

## 2018-12-08 ENCOUNTER — Encounter: Payer: Self-pay | Admitting: Oncology

## 2018-12-08 ENCOUNTER — Encounter: Payer: Self-pay | Admitting: *Deleted

## 2018-12-08 ENCOUNTER — Inpatient Hospital Stay (HOSPITAL_BASED_OUTPATIENT_CLINIC_OR_DEPARTMENT_OTHER): Payer: Medicare Other | Admitting: Oncology

## 2018-12-08 DIAGNOSIS — Z170 Estrogen receptor positive status [ER+]: Secondary | ICD-10-CM

## 2018-12-08 DIAGNOSIS — C50411 Malignant neoplasm of upper-outer quadrant of right female breast: Secondary | ICD-10-CM

## 2018-12-09 DIAGNOSIS — R7301 Impaired fasting glucose: Secondary | ICD-10-CM | POA: Diagnosis not present

## 2018-12-09 DIAGNOSIS — E039 Hypothyroidism, unspecified: Secondary | ICD-10-CM | POA: Diagnosis not present

## 2018-12-16 DIAGNOSIS — R7301 Impaired fasting glucose: Secondary | ICD-10-CM | POA: Diagnosis not present

## 2018-12-16 DIAGNOSIS — E039 Hypothyroidism, unspecified: Secondary | ICD-10-CM | POA: Diagnosis not present

## 2018-12-16 DIAGNOSIS — E785 Hyperlipidemia, unspecified: Secondary | ICD-10-CM | POA: Diagnosis not present

## 2018-12-16 DIAGNOSIS — E063 Autoimmune thyroiditis: Secondary | ICD-10-CM | POA: Diagnosis not present

## 2018-12-16 DIAGNOSIS — E162 Hypoglycemia, unspecified: Secondary | ICD-10-CM | POA: Diagnosis not present

## 2019-01-07 DIAGNOSIS — Z23 Encounter for immunization: Secondary | ICD-10-CM | POA: Diagnosis not present

## 2019-01-27 DIAGNOSIS — Z8711 Personal history of peptic ulcer disease: Secondary | ICD-10-CM | POA: Diagnosis not present

## 2019-01-27 DIAGNOSIS — R718 Other abnormality of red blood cells: Secondary | ICD-10-CM | POA: Diagnosis not present

## 2019-01-27 DIAGNOSIS — Z86010 Personal history of colonic polyps: Secondary | ICD-10-CM | POA: Diagnosis not present

## 2019-01-27 DIAGNOSIS — K449 Diaphragmatic hernia without obstruction or gangrene: Secondary | ICD-10-CM | POA: Diagnosis not present

## 2019-01-27 DIAGNOSIS — R1011 Right upper quadrant pain: Secondary | ICD-10-CM | POA: Diagnosis not present

## 2019-01-27 DIAGNOSIS — E611 Iron deficiency: Secondary | ICD-10-CM | POA: Diagnosis not present

## 2019-01-27 DIAGNOSIS — R1010 Upper abdominal pain, unspecified: Secondary | ICD-10-CM | POA: Diagnosis not present

## 2019-02-03 ENCOUNTER — Ambulatory Visit (INDEPENDENT_AMBULATORY_CARE_PROVIDER_SITE_OTHER): Payer: Medicare Other | Admitting: Internal Medicine

## 2019-02-03 ENCOUNTER — Other Ambulatory Visit: Payer: Self-pay

## 2019-02-03 ENCOUNTER — Encounter: Payer: Self-pay | Admitting: Internal Medicine

## 2019-02-03 DIAGNOSIS — J4540 Moderate persistent asthma, uncomplicated: Secondary | ICD-10-CM | POA: Diagnosis not present

## 2019-02-03 DIAGNOSIS — K219 Gastro-esophageal reflux disease without esophagitis: Secondary | ICD-10-CM | POA: Diagnosis not present

## 2019-02-03 DIAGNOSIS — I2510 Atherosclerotic heart disease of native coronary artery without angina pectoris: Secondary | ICD-10-CM

## 2019-02-03 MED ORDER — BREZTRI AEROSPHERE 160-9-4.8 MCG/ACT IN AERO: 2.0000 | INHALATION_SPRAY | Freq: Two times a day (BID) | RESPIRATORY_TRACT | 0 refills | Status: DC

## 2019-02-03 MED ORDER — BREZTRI AEROSPHERE 160-9-4.8 MCG/ACT IN AERO: 2.0000 | INHALATION_SPRAY | Freq: Two times a day (BID) | RESPIRATORY_TRACT | 11 refills | Status: DC

## 2019-02-03 NOTE — Assessment & Plan Note (Signed)
Informed at [facility] asthma center severe gerd likely the cause with large [place] on cxr but "turned surgery down" PFT's  07/30/11   FEV1  1.25 (61 % ) ratio 0.47  p 6 % improvement from saba p ? prior to study with DLCO  94 % and  corrects to 141  % for alv volume c classic f/v loop for  chronic asthma    - FENO 03/07/2016  =   9 on symb 160 2bid during acute flare - 07/31/2018     75% > continue symb 160 2bid  - 11/05/2018  After extensive coaching inhaler device,  effectiveness =    75% (short ti)  - 11/05/18  Alpha one AT :   MM   Level 179  - 02/03/2019  After extensive coaching inhaler device,  effectiveness =    75% (short Ti) > rec trial of breztri 2bid   She acts more like ACOS than asthma at this point and there are no studies or guidelines to treat this but reasonable to try adding lama to laba/ics on a trial basis for at least 2 weeks to see if any impact on doe and if not or if not affordable on her plan than ok to resume symbicort

## 2019-02-03 NOTE — Progress Notes (Signed)
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 Asthma flare Chief Complaint  Patient presents with  . Acute Visit    Pt c/o cough, wheezing and increased SOB x 2 wks. She is coughing up clear sputum.   prev w/u showed bad reflux  maint at baseline symb 160 2 bid and no proair  02/24/16 called in 7 days of worse cough/ wheeze/sob> rx medrol not clear she took it Exp to gg child sick prior to onset of cough rec Take 4 for three days 3 for three days 2 for three days 1 for three days and stop  Start Pantoprazole (protonix) 40 mg   Take  30-60 min before first meal of the day and Pepcid (famotidine)  20 mg one @  bedtime until return to office - this is the best way to tell whether stomach acid is contributing to your problem.      07/31/2018 acute extended ov/Eve Rey re:  Worse sob since   Nov 2018  Now on omeprazole 20mg  60 min ac  Early march 2020 worse sob despite symbicort 160 2 bid   No cough Sleeping on wedge  Room to room at home  rec Try omeprazole 20 mg x 2 x 30-60 min before your first meal Symbicort 160 Take 2 puffs first thing in am and then another 2 puffs about 12 hours later.  Only use your albuterol as a rescue medication  Please schedule a follow up visit in 3 months but call sooner if needed  with all medications /inhalers/ solutions in hand so we can verify exactly what you are taking. This includes all medications from all doctors and over the counters   11/05/2018  f/u ov/Shabnam Ladd re: chronic asthma on ppi 20 mg qd and symb 160 2bid / no meds in hand as  req Chief Complaint  Patient presents with  . Follow-up    Pt states her breathing has been some worse with the humid weather. She has used her albuterol inhaler 2 x since the last visit.   Dyspnea:  Aerobics/ swimming now again but doesn't do well out in heat/ humidity  Cough: no  Sleeping: no resp issues   SABA use: min hfa/ no neb 02: no rec Work on inhaler technique:   Please schedule a follow up visit in 6 months but call sooner if needed  with all medications /inhalers/ solutions in hand so we can verify exactly what you are taking. This includes all medications from all doctors and over the counters     02/03/2019  f/u ov/Makinzy Cleere re: chronic asthma, worse since September 2020 with overt gerd  Chief Complaint  Patient presents with  . Follow-up    Breathing is okay today. She is using her albuterol inhaler 1-2 x per wk on average.   Dyspnea:  Fine sitting still but doe across the room on symb 160 2bid and did not prev respond to singulair  Cough: worse bending over / non productive, on prilosec 20 mg ac qd  With overt HB Sleeping:  no resp symptoms  SABA use: 1-2x weekly when overdoes it, never pre-challenges / has neb but no meds for it x years. 02: none    No obvious day to day or daytime variability or assoc excess/ purulent sputum or mucus plugs or hemoptysis or cp or chest tightness, subjective wheeze or overt sinus   symptoms.   sleeping as above without nocturnal  or early am exacerbation  of respiratory  c/o's or need for noct saba. Also denies any obvious fluctuation of symptoms with weather or environmental changes or other aggravating or alleviating factors except as outlined above   No unusual exposure hx or h/o childhood pna/ asthma or knowledge of premature birth.  Current Allergies, Complete Past Medical History, Past Surgical History, Family History, and Social History were reviewed in [employer] record.  ROS  The following are not active  complaints unless bolded Hoarseness, sore throat, dysphagia, dental problems, itching, sneezing,  nasal congestion or discharge of excess mucus or purulent secretions, ear ache,   fever, chills, sweats, unintended wt loss or wt gain, classically pleuritic or exertional cp,  orthopnea pnd or arm/hand swelling  or leg swelling, presyncope, palpitations, abdominal pain, anorexia, nausea, vomiting, diarrhea  or change in bowel habits or change in bladder habits, change in stools or change in urine, dysuria, hematuria,  rash, arthralgias, visual complaints, headache, numbness, weakness or ataxia or problems with walking or coordination,  change in mood or  memory.        Current Meds  Medication Sig  . albuterol (PROVENTIL HFA;VENTOLIN HFA) 108 (90 Base) MCG/ACT inhaler INHALE 2 PUFFS INTO LUNGS EVERY 6 HOURS AS NEEDED FOR WHEEZING OR SHORTNESS OF BREATH  .     Marland Kitchen nitroGLYCERIN (NITROSTAT) 0.4 MG SL tablet DISSOLVE ONE TABLET UNDER THE TONGUE EVERY 5 MINUTES AS NEEDED FOR CHEST PAIN. (CHEST PAIN OR SHORTNESS OF BREATH)  . omeprazole (PRILOSEC) 20 MG capsule Take 20 mg by mouth daily.  . rosuvastatin (CRESTOR) 5 MG tablet   . SYMBICORT 160-4.5 MCG/ACT inhaler Inhale 2 puffs into the lungs 2 (two) times daily.  Marland Kitchen SYNTHROID 112 MCG tablet Take 1 tablet by mouth daily.  . tamoxifen (NOLVADEX) 20 MG tablet Take 1 tablet by mouth daily.                               Physical Exam:  02/03/2019     153 11/05/2018       152  07/31/2018       154   03/07/16 160 lb 12.8 oz (72.9 kg)  01/04/16 160 lb (72.6 kg)  04/26/15 159 lb 4.8 oz (72.3 kg)    Vital signs reviewed - Note on arrival 02 sats  95% on RA  amb wf who tends to avoid   questions asked in a straightforward manner, tending to go off on tangents or answer questions with ambiguous medical terms or diagnoses with multiple re-directs needed to complete the hx     HEENT : pt wearing mask not removed for exam due to covid - 19  concerns.    NECK :  without JVD/Nodes/TM/ nl carotid upstrokes bilaterally   LUNGS: no acc muscle use,  Mild barrel  contour chest wall with bilateral  Distant exp rhonchi  and  without cough on insp or exp maneuvers  and mild  Hyperresonant  to  percussion bilaterally     CV:  RRR  no  s3 or murmur or increase in P2, and no edema   ABD:  soft and nontender with pos end  insp Hoover's  in the supine position. No bruits or organomegaly appreciated, bowel sounds nl  MS:   Nl gait/  ext warm without deformities, calf tenderness, cyanosis or clubbing No obvious joint restrictions   SKIN: warm and dry without lesions    NEURO:  alert, approp, nl sensorium with  no motor or cerebellar deficits apparent.                  I personally reviewed images and agree with radiology impression as follows:  CXR:   07/31/2018  Large hiatal hernia stable from the prior exam. No acute abnormality noted                 Assessment/Plan:

## 2019-02-03 NOTE — Patient Instructions (Addendum)
GERD (REFLUX)  is an extremely common cause of respiratory symptoms just like yours , many times with no obvious heartburn at all.    It can be treated with medication, but also with lifestyle changes including elevation of the head of your bed (ideally with 6 -8inch blocks under the headboard of your bed),  Smoking cessation, avoidance of late meals, excessive alcohol, and avoid fatty foods, chocolate, peppermint, colas, red wine, and acidic juices such as orange juice.  NO MINT OR MENTHOL PRODUCTS SO NO COUGH DROPS  USE SUGARLESS CANDY INSTEAD (Jolley ranchers or Stover's or Life Savers) or even ice chips will also do - the key is to swallow to prevent all throat clearing. NO OIL BASED VITAMINS - use powdered substitutes.  Avoid fish oil when coughing.  Stop symbicort and start Breztri Take 2 puffs first thing in am and then another 2 puffs about 12 hours later.  Only use your albuterol as a rescue medication to be used if you can't catch your breath by resting or doing a relaxed purse lip breathing pattern.  - The less you use it, the better it will work when you need it. - Ok to use up to 2 puffs  every 4 hours if you must but call for immediate appointment if use goes up over your usual need - Don't leave home without it !!  (think of it like the spare tire for your car)   Discuss with Dr Cristina Gong re optimal options to treat your reflux   Please schedule a follow up visit in 6  months but call sooner if needed

## 2019-02-03 NOTE — Assessment & Plan Note (Signed)
eval at [facility] asthma center that gerd was contributing to asthma but declined surgery with large [place] present on cxr 02/03/2019   She is only on otc prilosec with over HB and cough bending over and poorly controlled asthma clinically so rec   1) diet/ bed blocks rec  2) discuss options with [place] ? HH repair ? NF   3) adverse effects of longterm PPI's also addressed vs death from poorly controlled chronic asthma or ultimately aspiration issues.   I had an extended discussion with the patient reviewing all relevant studies completed to date and  lasting 15 to 20 minutes of a 25 minute visit    I performed detailed device teaching using a teach back method which extended face to face time for this visit (see above)  Each maintenance medication was reviewed in detail including emphasizing most importantly the difference between maintenance and prns and under what circumstances the prns are to be triggered using an action plan format that is not reflected in the computer generated alphabetically organized AVS which I have not found useful in most complex patients, especially with respiratory illnesses  Please see AVS for specific instructions unique to this visit that I personally wrote and verbalized to the the pt in detail and then reviewed with pt  by my nurse highlighting any  changes in therapy recommended at today's visit to their plan of care.

## 2019-02-04 ENCOUNTER — Other Ambulatory Visit: Payer: Self-pay | Admitting: Dermatology

## 2019-02-04 DIAGNOSIS — C434 Malignant melanoma of scalp and neck: Secondary | ICD-10-CM | POA: Diagnosis not present

## 2019-02-10 ENCOUNTER — Telehealth: Payer: Self-pay | Admitting: Internal Medicine

## 2019-02-10 NOTE — Telephone Encounter (Signed)
I spoke with patient and made her aware and she verbalized understanding.

## 2019-02-10 NOTE — Telephone Encounter (Signed)
Spoke with the pt to verify the msg- Pt seen Dr. Melvyn Novas on 11/24.Stated she seen her hairdresser on 11/20.Hairdresser tested positive on 11/22 for covid.Pt had taken mask off for about 6-7 minutes but hairdresser was masked the whole time.Just wanted to advise [place].Pt states that she is not having any symptoms at all. She is staying home for now and isolating. Please advise if you have any additional recs for her, thanks

## 2019-02-10 NOTE — Telephone Encounter (Signed)
That's fine - should do 14 days of strict isolation (starting with 11/20 last contact with Positive pt)  and then go back to prior level of isolation that she should be following here on out anyway

## 2019-03-02 ENCOUNTER — Telehealth: Payer: Self-pay | Admitting: Internal Medicine

## 2019-03-02 MED ORDER — BREZTRI AEROSPHERE 160-9-4.8 MCG/ACT IN AERO: 2.0000 | INHALATION_SPRAY | Freq: Two times a day (BID) | RESPIRATORY_TRACT | 0 refills | Status: DC

## 2019-03-02 NOTE — Telephone Encounter (Signed)
Called and spoke with pt who stated pt is doing much better after changing to the [place]. Pt said she needs one more sample to hold her over until the first of the year which I stated I would place up front for her. Sample has been placed up front. Nothing further needed.

## 2019-03-11 ENCOUNTER — Other Ambulatory Visit: Payer: Self-pay | Admitting: Dermatology

## 2019-03-23 DIAGNOSIS — Z4802 Encounter for removal of sutures: Secondary | ICD-10-CM | POA: Diagnosis not present

## 2019-03-31 ENCOUNTER — Telehealth: Payer: Self-pay | Admitting: Cardiovascular Disease

## 2019-03-31 NOTE — Telephone Encounter (Signed)
New Message:   Pt wants to know if she can have an order to have an EKG at? She said it would be closer to go to [facility].

## 2019-03-31 NOTE — Telephone Encounter (Signed)
Called to speak with patient regarding why she feels she needs an EKG.  Pt was due for 6 month f/u with Dr Angelena Form 11/2018.  Pt reports she has been having episodes since December of left arm down into hand numbness/pain than last longer than 30 seconds.  This has occurred 5 times and "last night was pretty bad."  She denies any CP, N/V or new SOB (has asthma).  She has noticed an irregular heart beat from time to time.  Nothing makes these episodes worse or better.  She does not have any SL Ntg.  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  She denies active chest pain or arm pain/numbness at this time.  Attempted to schedule appt for patient with the next available provider here at the [facility] office (1/20).  Pt refused appt at this location, stating she will only drive to the [facility] office for evaluation.  Advised I would  find her the next available appt at the [facility] office.  Pt asking who the appt would be with before I could pull the schedule, then stated she only wanted to see Dr Angelena Form.  Advised pt his schedule is booked at this point and I do not feel she should wait until his next available for evaluation of this.  Pt insisted I schedule her with Dr Angelena Form, which I did (06/12/2019).  She states if she needs something before then she will go to a walk-in clinic.  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  Pt then disconnected the call.  I will forward this information to Dr Angelena Form and his nurse for their knowledge.

## 2019-04-01 ENCOUNTER — Encounter: Payer: Self-pay | Admitting: Cardiovascular Disease

## 2019-04-01 ENCOUNTER — Ambulatory Visit (INDEPENDENT_AMBULATORY_CARE_PROVIDER_SITE_OTHER): Payer: Medicare Other | Admitting: Cardiovascular Disease

## 2019-04-01 ENCOUNTER — Other Ambulatory Visit: Payer: Self-pay

## 2019-04-01 VITALS — BP 96/64 | HR 113 | Resp 12 | Wt 151.0 lb

## 2019-04-01 DIAGNOSIS — I25118 Atherosclerotic heart disease of native coronary artery with other forms of angina pectoris: Secondary | ICD-10-CM | POA: Diagnosis not present

## 2019-04-01 MED ORDER — NITROGLYCERIN 0.4 MG SL SUBL: SUBLINGUAL_TABLET | SUBLINGUAL | 4 refills | Status: DC

## 2019-04-01 MED ORDER — ASPIRIN EC 81 MG PO TBEC: 81.0000 mg | DELAYED_RELEASE_TABLET | Freq: Every day | ORAL | 3 refills | Status: DC

## 2019-04-01 NOTE — H&P (View-Only) (Signed)
Chief Complaint  Patient presents with  . Arm Pain    left arm pain/numbness x on an off since dec. had an episode yesterday but not having any pain or numbness today    History of Present Illness: 79 yo female with history of CAD, COPD, GERD and hypothyroidism who is here today for cardiac follow up. She was admitted to [facility] May 2018 with c/o left arm pain and found to have a NSTEMI. Cardiac cath May 2018 with severe stenosis in the mid RCA treated with a drug eluting stent. There was a moderate stenosis in the mid LAD. Echo May 2018 with normal LV systolic function, mild valve disease. She was discharged on Brilinta and readmitted one week later with a GI bleed. EGD showed gastritis. She was changed from Brilinta to Plavix and bleeding resolved. Chest pain and dyspnea at times felt to be due to her asthma as it improved with her inhalers. She has not tolerate statins. She cancelled a stress test arranged in March 2019. She was found to have breast cancer and has undergone lumpectomy in 2020.   She is here today for follow up. She called our office yesterday with left arm pain. This has occurred 6-7 times over the past month. The pain lasts for several minutes. No associated dyspnea or diaphoresis. This seems much like her symptoms prior to her MI. The pain occurs at rest. No lower extremity edema, orthopnea, PND, dizziness, near syncope or syncope.    Primary Care Physician: [name], MD  Past Medical History:  Diagnosis Date  . Arthritis   . Asthma    daily and prn inhalers; states good control currently  . CAD S/P percutaneous coronary angioplasty 07/15/2016  . COPD (chronic obstructive pulmonary disease) ([place])   . GERD (gastroesophageal reflux disease)    no current med.  . GI bleed 07/25/2016   Brilinta changed to Plavix  . Hashimoto's thyroiditis   . Headache    allergies; silent migraines  . Hiatal hernia   . Hypothyroidism   . Pneumonia   . 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 Vitamin D deficiency     Past Surgical History:  Procedure Laterality Date  . ABDOMINAL HYSTERECTOMY     partial  . APPENDECTOMY    . BREAST BIOPSY Left   . BREAST CYST EXCISION Left   . BREAST EXCISIONAL BIOPSY Left   . BREAST LUMPECTOMY WITH RADIOACTIVE SEED LOCALIZATION Left 04/07/2015   Procedure: BREAST LUMPECTOMY WITH RADIOACTIVE SEED LOCALIZATION;  Surgeon: Erroll Luna, MD;  Location: [place];  Service: General;  Laterality: Left;  . BREAST LUMPECTOMY WITH RADIOACTIVE SEED LOCALIZATION Right 08/13/2018   Procedure: RIGHT BREAST LUMPECTOMY WITH RADIOACTIVE SEED LOCALIZATION;  Surgeon: Rolm Bookbinder, MD;  Location: [place];  Service: General;  Laterality: Right;  . CORONARY STENT INTERVENTION N/A 07/15/2016   Procedure: Coronary Stent Intervention;  Surgeon: Sherren Mocha, MD;  Location: [place] [facility];  Service: Cardiovascular;  Laterality: N/A;  . ESOPHAGOGASTRODUODENOSCOPY (EGD) WITH PROPOFOL Left 07/25/2016   Procedure: ESOPHAGOGASTRODUODENOSCOPY (EGD) WITH PROPOFOL;  Surgeon: Ronnette Juniper, MD;  Location: [place];  Service: Gastroenterology;  Laterality: Left;  . KNEE ARTHROSCOPY Right 12/07/2003  . LAPAROSCOPIC SALPINGO OOPHERECTOMY Right 09/08/2003  . LEFT HEART CATH N/A 07/15/2016   Procedure: Left Heart Cath;  Surgeon: Sherren Mocha, MD;  Location: [place] [facility];  Service: Cardiovascular;  Laterality: N/A;  . LYSIS OF ADHESION  09/08/2003  . NASAL  SINUS SURGERY  age 32  . RECTAL POLYPECTOMY  10/02/2006    Current Outpatient Medications  Medication Sig Dispense Refill  . albuterol (PROVENTIL HFA;VENTOLIN HFA) 108 (90 Base) MCG/ACT inhaler INHALE 2 PUFFS INTO LUNGS EVERY 6 HOURS AS NEEDED FOR WHEEZING OR SHORTNESS OF BREATH 27 g 0  . Budeson-Glycopyrrol-Formoterol (BREZTRI AEROSPHERE) 160-9-4.8 MCG/ACT AERO Inhale 2 puffs into the lungs 2 (two) times daily. 10.7 g 11  . Budeson-Glycopyrrol-Formoterol  (BREZTRI AEROSPHERE) 160-9-4.8 MCG/ACT AERO Inhale 2 puffs into the lungs 2 (two) times daily. 5.9 g 0  . nitroGLYCERIN (NITROSTAT) 0.4 MG SL tablet DISSOLVE ONE TABLET UNDER THE TONGUE EVERY 5 MINUTES AS NEEDED FOR CHEST PAIN. (CHEST PAIN OR SHORTNESS OF BREATH) 25 tablet 4  . rosuvastatin (CRESTOR) 5 MG tablet Take 5 mg by mouth 2 (two) times a week.     Marland Kitchen SYNTHROID 112 MCG tablet Take 1 tablet by mouth daily.    . tamoxifen (NOLVADEX) 20 MG tablet Take 1 tablet by mouth daily.    Marland Kitchen aspirin EC 81 MG tablet Take 1 tablet (81 mg total) by mouth daily. 90 tablet 3   No current facility-administered medications for this visit.    Allergies  Allergen Reactions  . Adenosine Shortness Of Breath  . Contrast Media [Iodinated Diagnostic Agents] Anaphylaxis  . Codeine Sulfate Nausea And Vomiting  . Latex Other (See Comments)    Skin irritation    Social History   Socioeconomic History  . Marital status: Divorced    Spouse name: Not on file  . Number of children: 2  . Years of education: Not on file  . Highest education level: Not on file  Occupational History  . 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 Smoking status: Never Smoker  . Smokeless tobacco: Never Used  Substance and Sexual Activity  . Alcohol use: Not Currently    Comment: rarely  . Drug use: No  . Sexual activity: Not on file  Other Topics Concern  . Not on file  Social History Narrative  . Not on file   Social Determinants of Health   Financial Resource Strain:   . Difficulty of Paying Living Expenses: Not on file  Food Insecurity:   . Worried About Charity fundraiser in the Last Year: Not on file  . Ran Out of Food in the Last Year: Not on file  Transportation Needs:   . Lack of Transportation (Medical): Not on file  . Lack of Transportation (Non-Medical): Not on file  Physical Activity:   . Days of Exercise per Week: Not on file  . Minutes of Exercise  per Session: Not on file  Stress:   . Feeling of Stress : Not on file  Social Connections:   . Frequency of Communication with Friends and Family: Not on file  . Frequency of Social Gatherings with Friends and Family: Not on file  . Attends Religious Services: Not on file  . Active Member of Clubs or Organizations: Not on file  . Attends Archivist Meetings: Not on file  . Marital Status: Not on file  Intimate Partner Violence:   . Fear of Current or Ex-Partner: Not on file  . Emotionally Abused: Not on file  . Physically Abused: Not on file  . Sexually Abused: Not on file    Family History  Problem Relation Age of Onset  . Asthma Mother   . Heart disease Mother   .  Asthma Sister   . Heart disease Sister   . Breast cancer Sister 56  . Heart disease Father   . Kidney disease Father   . Prostate cancer Brother        dx. 109s  . Heart attack Brother 3  . Alzheimer's disease Brother 69  . Breast cancer Maternal Aunt 70  . Stomach cancer Maternal Grandfather 60  . Asthma Sister   . Memory loss Sister   . Heart attack Son 25  . Spinal muscular atrophy Grandchild        type II  . Cancer Other 60       niece dx. ca of salivary gland   . Breast cancer Other 51       niece; negative genetic testing 10 years ago  . Melanoma Other   . Alzheimer's disease Maternal Aunt   . Cervical cancer Cousin        maternal 1st cousin dx. at 69  . Breast cancer Cousin 85       maternal 1st cousin  . Lung cancer Cousin 2       maternal 1st cousin; smoker  . Heart disease Cousin   . Leukemia Cousin 14       paternal 1st cousin  . Breast cancer Other        paternal great grandmother (PGF's mother) dx. late 38s-early 19s  . Coronary artery disease Other     Review of Systems:  As stated in the HPI and otherwise negative.   BP 96/64   Pulse (!) 113   Resp 12   Wt 151 lb (68.5 kg)   SpO2 97%   BMI 25.92 kg/m   Physical Examination:  General: Well developed, well  nourished, NAD  HEENT: OP clear, mucus membranes moist  SKIN: warm, dry. No rashes. Neuro: No focal deficits  Musculoskeletal: Muscle strength 5/5 all ext  Psychiatric: Mood and affect normal  Neck: No JVD, no carotid bruits, no thyromegaly, no lymphadenopathy.  Lungs:Clear bilaterally, no wheezes, rhonci, crackles Cardiovascular: Regular rate and rhythm. No murmurs, gallops or rubs. Abdomen:Soft. Bowel sounds present. Non-tender.  Extremities: No lower extremity edema. Pulses are 2 + in the bilateral DP/PT.  Echo 07/16/16: - Left ventricle: The cavity size was normal. Systolic function was   normal. The estimated ejection fraction was in the range of 60%   to 65%. Wall motion was normal; there were no regional wall   motion abnormalities. Features are consistent with a pseudonormal   left ventricular filling pattern, with concomitant abnormal   relaxation and increased filling pressure (grade 2 diastolic   dysfunction). Doppler parameters are consistent with high   ventricular filling pressure. - Mitral valve: Calcified annulus. There was mild regurgitation. - Tricuspid valve: There was mild regurgitation. - Pulmonic valve: There was trivial regurgitation. - Pulmonary arteries: PA peak pressure: 37 mm Hg (S). Impressions: - The right ventricular systolic pressure was increased consistent   with mild pulmonary hypertension.  EKG:  EKG is ordered today. The ekg ordered today demonstrates Sinus tachycardia, PACs  Recent Labs: 06/04/2018: ALT 6 07/31/2018: BUN 23; Creatinine, Ser 1.03; Hemoglobin 14.1; Platelets 129.0; Potassium 4.5; Pro B Natriuretic peptide (BNP) 196.0; Sodium 142; TSH 0.10   Lipid Panel    Component Value Date/Time   CHOL 174 07/26/2017 1001   TRIG 65 07/26/2017 1001   HDL 76 07/26/2017 1001   CHOLHDL 2.5 01/16/2017 1036   CHOLHDL 3.0 07/13/2016 2331   VLDL 12 07/13/2016 2331  LDLCALC 85 07/26/2017 1001     Wt Readings from Last 3 Encounters:  04/01/19  151 lb (68.5 kg)  02/03/19 153 lb 6.4 oz (69.6 kg)  11/05/18 152 lb 12.8 oz (69.3 kg)     Other studies Reviewed: Additional studies/ records that were reviewed today include: . Review of the above records demonstrates:   Assessment and Plan:   1. CAD with angina: She has been having left arm pain which is her presenting symptom with her NSTEMI in 2018. The pain occurs at rest, lasts for several minutes and is not associated with dyspnea. I have encouraged her to consider a cardiac cath but she does not want to have a cath right now. She will restart ASA 81 mg daily. Continue statin. I will refill her NTG. She does not wish to try Imdur. She is not on a beta blocker due to fatigue and hypotension.  She will call back with change in symptoms. She understands that I have recommended a cardiac cath.   2. HLD: LDL near goal in 2019. Continue statin   Current medicines are reviewed at length with the patient today.  The patient does not have concerns regarding medicines.  The following changes have been made:  no change  Labs/ tests ordered today include:   No orders of the defined types were placed in this encounter.    Disposition:   FU with me in 3 months   Signed, [name], MD 04/01/2019 5:10 PM    [address], [place], [place]  [address] Phone: [phone]; Fax: [phone]

## 2019-04-01 NOTE — Patient Instructions (Signed)
Medication Instructions:  Your physician has recommended you make the following change in your medication:  1.) restart aspirin 81 mg --once a day  *If you need a refill on your cardiac medications before your next appointment, please call your pharmacy*  Lab Work: none If you have labs (blood work) drawn today and your tests are completely normal, you will receive your results only by: Marland Kitchen MyChart Message (if you have MyChart) OR . A paper copy in the mail If you have any lab test that is abnormal or we need to change your treatment, we will call you to review the results.  Testing/Procedures: none  Follow-Up: At [facility], you and your health needs are our priority.  As part of our continuing mission to provide you with exceptional heart care, we have created designated Provider Care Teams.  These Care Teams include your primary Cardiologist (physician) and Advanced Practice Providers (APPs -  Physician Assistants and Nurse Practitioners) who all work together to provide you with the care you need, when you need it.  Your next appointment:   3 month(s)  The format for your next appointment:   In Person  Provider:   You may see [name], MD or one of the following Advanced Practice Providers on your designated Care Team:    Melina Copa, PA-C  Ermalinda Barrios, PA-C   Other Instructions

## 2019-04-01 NOTE — Progress Notes (Signed)
Chief Complaint  Patient presents with  . Arm Pain    left arm pain/numbness x on an off since dec. had an episode yesterday but not having any pain or numbness today    History of Present Illness: 79 yo female with history of CAD, COPD, GERD and hypothyroidism who is here today for cardiac follow up. She was admitted to [facility] May 2018 with c/o left arm pain and found to have a NSTEMI. Cardiac cath May 2018 with severe stenosis in the mid RCA treated with a drug eluting stent. There was a moderate stenosis in the mid LAD. Echo May 2018 with normal LV systolic function, mild valve disease. She was discharged on Brilinta and readmitted one week later with a GI bleed. EGD showed gastritis. She was changed from Brilinta to Plavix and bleeding resolved. Chest pain and dyspnea at times felt to be due to her asthma as it improved with her inhalers. She has not tolerate statins. She cancelled a stress test arranged in March 2019. She was found to have breast cancer and has undergone lumpectomy in 2020.   She is here today for follow up. She called our office yesterday with left arm pain. This has occurred 6-7 times over the past month. The pain lasts for several minutes. No associated dyspnea or diaphoresis. This seems much like her symptoms prior to her MI. The pain occurs at rest. No lower extremity edema, orthopnea, PND, dizziness, near syncope or syncope.    Primary Care Physician: [name], MD  Past Medical History:  Diagnosis Date  . Arthritis   . Asthma    daily and prn inhalers; states good control currently  . CAD S/P percutaneous coronary angioplasty 07/15/2016  . COPD (chronic obstructive pulmonary disease) ([place])   . GERD (gastroesophageal reflux disease)    no current med.  . GI bleed 07/25/2016   Brilinta changed to Plavix  . Hashimoto's thyroiditis   . Headache    allergies; silent migraines  . Hiatal hernia   . Hypothyroidism   . Pneumonia   . 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 Vitamin D deficiency     Past Surgical History:  Procedure Laterality Date  . ABDOMINAL HYSTERECTOMY     partial  . APPENDECTOMY    . BREAST BIOPSY Left   . BREAST CYST EXCISION Left   . BREAST EXCISIONAL BIOPSY Left   . BREAST LUMPECTOMY WITH RADIOACTIVE SEED LOCALIZATION Left 04/07/2015   Procedure: BREAST LUMPECTOMY WITH RADIOACTIVE SEED LOCALIZATION;  Surgeon: Erroll Luna, MD;  Location: [place];  Service: General;  Laterality: Left;  . BREAST LUMPECTOMY WITH RADIOACTIVE SEED LOCALIZATION Right 08/13/2018   Procedure: RIGHT BREAST LUMPECTOMY WITH RADIOACTIVE SEED LOCALIZATION;  Surgeon: Rolm Bookbinder, MD;  Location: [place];  Service: General;  Laterality: Right;  . CORONARY STENT INTERVENTION N/A 07/15/2016   Procedure: Coronary Stent Intervention;  Surgeon: Sherren Mocha, MD;  Location: [place] [facility];  Service: Cardiovascular;  Laterality: N/A;  . ESOPHAGOGASTRODUODENOSCOPY (EGD) WITH PROPOFOL Left 07/25/2016   Procedure: ESOPHAGOGASTRODUODENOSCOPY (EGD) WITH PROPOFOL;  Surgeon: Ronnette Juniper, MD;  Location: [place];  Service: Gastroenterology;  Laterality: Left;  . KNEE ARTHROSCOPY Right 12/07/2003  . LAPAROSCOPIC SALPINGO OOPHERECTOMY Right 09/08/2003  . LEFT HEART CATH N/A 07/15/2016   Procedure: Left Heart Cath;  Surgeon: Sherren Mocha, MD;  Location: [place] [facility];  Service: Cardiovascular;  Laterality: N/A;  . LYSIS OF ADHESION  09/08/2003  . NASAL  SINUS SURGERY  age 63  . RECTAL POLYPECTOMY  10/02/2006    Current Outpatient Medications  Medication Sig Dispense Refill  . albuterol (PROVENTIL HFA;VENTOLIN HFA) 108 (90 Base) MCG/ACT inhaler INHALE 2 PUFFS INTO LUNGS EVERY 6 HOURS AS NEEDED FOR WHEEZING OR SHORTNESS OF BREATH 27 g 0  . Budeson-Glycopyrrol-Formoterol (BREZTRI AEROSPHERE) 160-9-4.8 MCG/ACT AERO Inhale 2 puffs into the lungs 2 (two) times daily. 10.7 g 11  . Budeson-Glycopyrrol-Formoterol  (BREZTRI AEROSPHERE) 160-9-4.8 MCG/ACT AERO Inhale 2 puffs into the lungs 2 (two) times daily. 5.9 g 0  . nitroGLYCERIN (NITROSTAT) 0.4 MG SL tablet DISSOLVE ONE TABLET UNDER THE TONGUE EVERY 5 MINUTES AS NEEDED FOR CHEST PAIN. (CHEST PAIN OR SHORTNESS OF BREATH) 25 tablet 4  . rosuvastatin (CRESTOR) 5 MG tablet Take 5 mg by mouth 2 (two) times a week.     Marland Kitchen SYNTHROID 112 MCG tablet Take 1 tablet by mouth daily.    . tamoxifen (NOLVADEX) 20 MG tablet Take 1 tablet by mouth daily.    Marland Kitchen aspirin EC 81 MG tablet Take 1 tablet (81 mg total) by mouth daily. 90 tablet 3   No current facility-administered medications for this visit.    Allergies  Allergen Reactions  . Adenosine Shortness Of Breath  . Contrast Media [Iodinated Diagnostic Agents] Anaphylaxis  . Codeine Sulfate Nausea And Vomiting  . Latex Other (See Comments)    Skin irritation    Social History   Socioeconomic History  . Marital status: Divorced    Spouse name: Not on file  . Number of children: 2  . Years of education: Not on file  . Highest education level: Not on file  Occupational History  . 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 Smoking status: Never Smoker  . Smokeless tobacco: Never Used  Substance and Sexual Activity  . Alcohol use: Not Currently    Comment: rarely  . Drug use: No  . Sexual activity: Not on file  Other Topics Concern  . Not on file  Social History Narrative  . Not on file   Social Determinants of Health   Financial Resource Strain:   . Difficulty of Paying Living Expenses: Not on file  Food Insecurity:   . Worried About Charity fundraiser in the Last Year: Not on file  . Ran Out of Food in the Last Year: Not on file  Transportation Needs:   . Lack of Transportation (Medical): Not on file  . Lack of Transportation (Non-Medical): Not on file  Physical Activity:   . Days of Exercise per Week: Not on file  . Minutes of Exercise  per Session: Not on file  Stress:   . Feeling of Stress : Not on file  Social Connections:   . Frequency of Communication with Friends and Family: Not on file  . Frequency of Social Gatherings with Friends and Family: Not on file  . Attends Religious Services: Not on file  . Active Member of Clubs or Organizations: Not on file  . Attends Archivist Meetings: Not on file  . Marital Status: Not on file  Intimate Partner Violence:   . Fear of Current or Ex-Partner: Not on file  . Emotionally Abused: Not on file  . Physically Abused: Not on file  . Sexually Abused: Not on file    Family History  Problem Relation Age of Onset  . Asthma Mother   . Heart disease Mother   .  Asthma Sister   . Heart disease Sister   . Breast cancer Sister 3  . Heart disease Father   . Kidney disease Father   . Prostate cancer Brother        dx. 63s  . Heart attack Brother 40  . Alzheimer's disease Brother 16  . Breast cancer Maternal Aunt 70  . Stomach cancer Maternal Grandfather 60  . Asthma Sister   . Memory loss Sister   . Heart attack Son 35  . Spinal muscular atrophy Grandchild        type II  . Cancer Other 45       niece dx. ca of salivary gland   . Breast cancer Other 20       niece; negative genetic testing 10 years ago  . Melanoma Other   . Alzheimer's disease Maternal Aunt   . Cervical cancer Cousin        maternal 1st cousin dx. at 41  . Breast cancer Cousin 46       maternal 1st cousin  . Lung cancer Cousin 63       maternal 1st cousin; smoker  . Heart disease Cousin   . Leukemia Cousin 14       paternal 1st cousin  . Breast cancer Other        paternal great grandmother (PGF's mother) dx. late 23s-early 87s  . Coronary artery disease Other     Review of Systems:  As stated in the HPI and otherwise negative.   BP 96/64   Pulse (!) 113   Resp 12   Wt 151 lb (68.5 kg)   SpO2 97%   BMI 25.92 kg/m   Physical Examination:  General: Well developed, well  nourished, NAD  HEENT: OP clear, mucus membranes moist  SKIN: warm, dry. No rashes. Neuro: No focal deficits  Musculoskeletal: Muscle strength 5/5 all ext  Psychiatric: Mood and affect normal  Neck: No JVD, no carotid bruits, no thyromegaly, no lymphadenopathy.  Lungs:Clear bilaterally, no wheezes, rhonci, crackles Cardiovascular: Regular rate and rhythm. No murmurs, gallops or rubs. Abdomen:Soft. Bowel sounds present. Non-tender.  Extremities: No lower extremity edema. Pulses are 2 + in the bilateral DP/PT.  Echo 07/16/16: - Left ventricle: The cavity size was normal. Systolic function was   normal. The estimated ejection fraction was in the range of 60%   to 65%. Wall motion was normal; there were no regional wall   motion abnormalities. Features are consistent with a pseudonormal   left ventricular filling pattern, with concomitant abnormal   relaxation and increased filling pressure (grade 2 diastolic   dysfunction). Doppler parameters are consistent with high   ventricular filling pressure. - Mitral valve: Calcified annulus. There was mild regurgitation. - Tricuspid valve: There was mild regurgitation. - Pulmonic valve: There was trivial regurgitation. - Pulmonary arteries: PA peak pressure: 37 mm Hg (S). Impressions: - The right ventricular systolic pressure was increased consistent   with mild pulmonary hypertension.  EKG:  EKG is ordered today. The ekg ordered today demonstrates Sinus tachycardia, PACs  Recent Labs: 06/04/2018: ALT 6 07/31/2018: BUN 23; Creatinine, Ser 1.03; Hemoglobin 14.1; Platelets 129.0; Potassium 4.5; Pro B Natriuretic peptide (BNP) 196.0; Sodium 142; TSH 0.10   Lipid Panel    Component Value Date/Time   CHOL 174 07/26/2017 1001   TRIG 65 07/26/2017 1001   HDL 76 07/26/2017 1001   CHOLHDL 2.5 01/16/2017 1036   CHOLHDL 3.0 07/13/2016 2331   VLDL 12 07/13/2016 2331  LDLCALC 85 07/26/2017 1001     Wt Readings from Last 3 Encounters:  04/01/19  151 lb (68.5 kg)  02/03/19 153 lb 6.4 oz (69.6 kg)  11/05/18 152 lb 12.8 oz (69.3 kg)     Other studies Reviewed: Additional studies/ records that were reviewed today include: . Review of the above records demonstrates:   Assessment and Plan:   1. CAD with angina: She has been having left arm pain which is her presenting symptom with her NSTEMI in 2018. The pain occurs at rest, lasts for several minutes and is not associated with dyspnea. I have encouraged her to consider a cardiac cath but she does not want to have a cath right now. She will restart ASA 81 mg daily. Continue statin. I will refill her NTG. She does not wish to try Imdur. She is not on a beta blocker due to fatigue and hypotension.  She will call back with change in symptoms. She understands that I have recommended a cardiac cath.   2. HLD: LDL near goal in 2019. Continue statin   Current medicines are reviewed at length with the patient today.  The patient does not have concerns regarding medicines.  The following changes have been made:  no change  Labs/ tests ordered today include:   No orders of the defined types were placed in this encounter.    Disposition:   FU with me in 3 months   Signed, [name], MD 04/01/2019 5:10 PM    [address], [place], [place]  [address] Phone: [phone]; Fax: [phone]

## 2019-04-01 NOTE — Telephone Encounter (Signed)
Spoke with patient and adv of recommended appointment this afternoon with Dr. Angelena Form at [facility] location. She had some dental work yesterday and is not sure she is going to be able to drive here this afternoon.  She will try to get someone to drive her.  Will call back if unable to keep this appointment.

## 2019-04-01 NOTE — Telephone Encounter (Signed)
I can see her at the end of the day today. I do not think she should wait until April if she is having trouble. Gerald Stabs

## 2019-04-03 NOTE — Addendum Note (Signed)
Addended by: Mendel Ryder on: 04/03/2019 08:36 AM   Modules accepted: Orders

## 2019-04-09 ENCOUNTER — Telehealth: Payer: Self-pay | Admitting: Cardiovascular Disease

## 2019-04-09 ENCOUNTER — Encounter: Payer: Self-pay | Admitting: *Deleted

## 2019-04-09 DIAGNOSIS — I25118 Atherosclerotic heart disease of native coronary artery with other forms of angina pectoris: Secondary | ICD-10-CM

## 2019-04-09 MED ORDER — PREDNISONE 50 MG PO TABS: ORAL_TABLET | ORAL | 0 refills | Status: DC

## 2019-04-09 NOTE — Telephone Encounter (Signed)
Spoke with patient who is continuing with symptoms described at last ov on 04/01/19 with Dr. Angelena Form.  Having aching pain above and below left scapula that also feels like a very tired/fatigued ache.  Not had any more left arm pain. Still feeling skipped beats.  She would like to pursue cardiac cath as was discussed with her at the last visit.  She states the reason she did not call sooner was because she has an abscessed tooth.  She saw her dentist and has been on amoxicillin for 5 days and doing rinses and has 5 more days of antibiotic.  I have scheduled her for left heart cath on 04/14/19 with Dr. Angelena Form, at 12:30 pm. She will come for labs and have covid screening tomorrow and pick up her instruction letter which includes pre med instructions for contrast allergy.

## 2019-04-09 NOTE — Telephone Encounter (Signed)
Patient would like to set up her Heart Cath with Dr. Angelena Form. She would like Dr. Angelena Form whenever he gets a chance

## 2019-04-10 ENCOUNTER — Other Ambulatory Visit (HOSPITAL_COMMUNITY)
Admission: RE | Admit: 2019-04-10 | Discharge: 2019-04-10 | Disposition: A | Discharge status: Home / Self Care | Payer: Medicare Other | Source: Ambulatory Visit | Attending: Cardiovascular Disease | Admitting: Cardiovascular Disease

## 2019-04-10 ENCOUNTER — Other Ambulatory Visit: Payer: Medicare Other | Admitting: *Deleted

## 2019-04-10 ENCOUNTER — Other Ambulatory Visit: Payer: Self-pay

## 2019-04-10 DIAGNOSIS — I25118 Atherosclerotic heart disease of native coronary artery with other forms of angina pectoris: Secondary | ICD-10-CM | POA: Diagnosis not present

## 2019-04-10 DIAGNOSIS — Z20822 Contact with and (suspected) exposure to covid-19: Secondary | ICD-10-CM | POA: Insufficient documentation

## 2019-04-10 DIAGNOSIS — Z01812 Encounter for preprocedural laboratory examination: Secondary | ICD-10-CM | POA: Diagnosis not present

## 2019-04-10 LAB — CBC
Hematocrit: 43.5 % (ref 34.0–46.6)
Hemoglobin: 14.3 g/dL (ref 11.1–15.9)
MCH: 31.2 pg (ref 26.6–33.0)
MCHC: 32.9 g/dL (ref 31.5–35.7)
MCV: 95 fL (ref 79–97)
Platelets: 168 10*3/uL (ref 150–450)
RBC: 4.59 x10E6/uL (ref 3.77–5.28)
RDW: 13 % (ref 11.7–15.4)
WBC: 7.9 10*3/uL (ref 3.4–10.8)

## 2019-04-10 LAB — BASIC METABOLIC PANEL
BUN/Creatinine Ratio: 12 (ref 12–28)
BUN: 13 mg/dL (ref 8–27)
CO2: 22 mmol/L (ref 20–29)
Calcium: 8.6 mg/dL — ABNORMAL LOW (ref 8.7–10.3)
Chloride: 107 mmol/L — ABNORMAL HIGH (ref 96–106)
Creatinine, Ser: 1.07 mg/dL — ABNORMAL HIGH (ref 0.57–1.00)
GFR calc Af Amer: 57 mL/min/{1.73_m2} — ABNORMAL LOW (ref >59)
GFR calc non Af Amer: 49 mL/min/{1.73_m2} — ABNORMAL LOW (ref >59)
Glucose: 90 mg/dL (ref 65–99)
Potassium: 4.6 mmol/L (ref 3.5–5.2)
Sodium: 144 mmol/L (ref 134–144)

## 2019-04-10 LAB — SARS CORONAVIRUS 2 (TAT 6-24 HRS): SARS Coronavirus 2: NEGATIVE

## 2019-04-10 NOTE — Addendum Note (Signed)
Addended by: [name] D on: 04/10/2019 12:41 PM   Modules accepted: Orders, SmartSet

## 2019-04-13 ENCOUNTER — Telehealth: Payer: Self-pay | Admitting: *Deleted

## 2019-04-13 NOTE — Telephone Encounter (Signed)
Pt contacted pre-catheterization scheduled at [facility] for: Tuesday April 14, 2019 12:30 PM Verified arrival time and place: [facility]) at: 10:30 AM   No solid food after midnight prior to cath, clear liquids until 5 AM day of procedure.  Contrast allergy: yes -13 hour benadryl and prednisone prep reviewed with patient: 04/13/19 Prednisone 50 mg 11:30 PM 04/14/19 Prednisone 50 mg 5:30 AM 04/14/19 Prednisone 50 mg and benadryl 50 mg -AM of procedure just prior to leaving for hospital.  AM meds can be taken pre-cath with sip of water including: ASA 81 mg Prednisone 50 mg Benadryl 50 mg Pt advised not to drive to hospital.  Confirmed patient has responsible adult to drive home post procedure and observe 24 hours after arriving home:  yes  Currently, due to Covid-19 pandemic, only one person will be allowed with patient. Must be the same person for patient's entire stay and will be required to wear a mask. They will be asked to wait in the waiting room for the duration of the patient's stay.  Patients are required to wear a mask when they enter the hospital.      COVID-19 Pre-Screening Questions:  . In the past 7 to 10 days have you had a cough,  shortness of breath, headache, congestion, fever (100 or greater) body aches, chills, sore throat, or sudden loss of taste or sense of smell? Pt states she is 79 years old and has some of these general  symptoms -symptoms not new in 7-10 days, denies fever. . Have you been around anyone with known Covid 19? no . Have you been around anyone who is awaiting Covid 19 test results in the past 7 to 10 days? no . Have you been around anyone who has been exposed to Covid 19, or has mentioned symptoms of Covid 19 within the past 7 to 10 days? No  I reviewed procedure/mask/visitor instructions, Covid-19 screening questions with patient, she verbalized understanding, thanked me for call.

## 2019-04-14 ENCOUNTER — Other Ambulatory Visit: Payer: Self-pay

## 2019-04-14 ENCOUNTER — Encounter (HOSPITAL_COMMUNITY)
Admission: RE | Disposition: A | Discharge status: Home / Self Care | Payer: Self-pay | Source: Home / Self Care | Attending: Cardiovascular Disease

## 2019-04-14 ENCOUNTER — Ambulatory Visit (HOSPITAL_COMMUNITY)
Admission: RE | Admit: 2019-04-14 | Discharge: 2019-04-14 | Disposition: A | Discharge status: Home / Self Care | Payer: Medicare Other | Attending: Cardiovascular Disease | Admitting: Cardiovascular Disease

## 2019-04-14 DIAGNOSIS — Z8249 Family history of ischemic heart disease and other diseases of the circulatory system: Secondary | ICD-10-CM | POA: Insufficient documentation

## 2019-04-14 DIAGNOSIS — I25118 Atherosclerotic heart disease of native coronary artery with other forms of angina pectoris: Secondary | ICD-10-CM

## 2019-04-14 DIAGNOSIS — M1990 Unspecified osteoarthritis, unspecified site: Secondary | ICD-10-CM | POA: Insufficient documentation

## 2019-04-14 DIAGNOSIS — Z885 Allergy status to narcotic agent status: Secondary | ICD-10-CM | POA: Insufficient documentation

## 2019-04-14 DIAGNOSIS — J449 Chronic obstructive pulmonary disease, unspecified: Secondary | ICD-10-CM | POA: Insufficient documentation

## 2019-04-14 DIAGNOSIS — Z79899 Other long term (current) drug therapy: Secondary | ICD-10-CM | POA: Diagnosis not present

## 2019-04-14 DIAGNOSIS — Z825 Family history of asthma and other chronic lower respiratory diseases: Secondary | ICD-10-CM | POA: Insufficient documentation

## 2019-04-14 DIAGNOSIS — Z79890 Hormone replacement therapy (postmenopausal): Secondary | ICD-10-CM | POA: Diagnosis not present

## 2019-04-14 DIAGNOSIS — Z888 Allergy status to other drugs, medicaments and biological substances status: Secondary | ICD-10-CM | POA: Diagnosis not present

## 2019-04-14 DIAGNOSIS — Z955 Presence of coronary angioplasty implant and graft: Secondary | ICD-10-CM | POA: Diagnosis not present

## 2019-04-14 DIAGNOSIS — Z7982 Long term (current) use of aspirin: Secondary | ICD-10-CM | POA: Diagnosis not present

## 2019-04-14 DIAGNOSIS — I252 Old myocardial infarction: Secondary | ICD-10-CM | POA: Diagnosis not present

## 2019-04-14 DIAGNOSIS — E063 Autoimmune thyroiditis: Secondary | ICD-10-CM | POA: Insufficient documentation

## 2019-04-14 DIAGNOSIS — Z91040 Latex allergy status: Secondary | ICD-10-CM | POA: Diagnosis not present

## 2019-04-14 DIAGNOSIS — E785 Hyperlipidemia, unspecified: Secondary | ICD-10-CM | POA: Diagnosis not present

## 2019-04-14 DIAGNOSIS — Z91041 Radiographic dye allergy status: Secondary | ICD-10-CM | POA: Insufficient documentation

## 2019-04-14 DIAGNOSIS — K219 Gastro-esophageal reflux disease without esophagitis: Secondary | ICD-10-CM | POA: Insufficient documentation

## 2019-04-14 DIAGNOSIS — Z7902 Long term (current) use of antithrombotics/antiplatelets: Secondary | ICD-10-CM | POA: Insufficient documentation

## 2019-04-14 HISTORY — PX: LEFT HEART CATH AND CORONARY ANGIOGRAPHY: CATH118249

## 2019-04-14 SURGERY — LEFT HEART CATH AND CORONARY ANGIOGRAPHY
Anesthesia: LOCAL

## 2019-04-14 MED ORDER — LIDOCAINE HCL (PF) 1 % IJ SOLN
INTRAMUSCULAR | Status: DC | PRN
  Administered 2019-04-14: 2 mL

## 2019-04-14 MED ORDER — ONDANSETRON HCL 4 MG/2ML IJ SOLN: 4.0000 mg | Freq: Four times a day (QID) | INTRAMUSCULAR | Status: DC | PRN

## 2019-04-14 MED ORDER — HEPARIN SODIUM (PORCINE) 1000 UNIT/ML IJ SOLN
INTRAMUSCULAR | Status: AC
  Filled 2019-04-14: qty 1

## 2019-04-14 MED ORDER — SODIUM CHLORIDE 0.9 % IV SOLN: 250.0000 mL | INTRAVENOUS | Status: DC | PRN

## 2019-04-14 MED ORDER — FENTANYL CITRATE (PF) 100 MCG/2ML IJ SOLN
INTRAMUSCULAR | Status: DC | PRN
  Administered 2019-04-14: 25 ug via INTRAVENOUS

## 2019-04-14 MED ORDER — ASPIRIN 81 MG PO CHEW: 81.0000 mg | CHEWABLE_TABLET | ORAL | Status: DC

## 2019-04-14 MED ORDER — HEPARIN (PORCINE) IN NACL 1000-0.9 UT/500ML-% IV SOLN
INTRAVENOUS | Status: AC
  Filled 2019-04-14: qty 1000

## 2019-04-14 MED ORDER — DIPHENHYDRAMINE HCL 50 MG/ML IJ SOLN
INTRAMUSCULAR | Status: AC
  Filled 2019-04-14: qty 1

## 2019-04-14 MED ORDER — SODIUM CHLORIDE 0.9% FLUSH: 3.0000 mL | INTRAVENOUS | Status: DC | PRN

## 2019-04-14 MED ORDER — SODIUM CHLORIDE 0.9 % IV SOLN: INTRAVENOUS | Status: AC

## 2019-04-14 MED ORDER — SODIUM CHLORIDE 0.9 % WEIGHT BASED INFUSION: 1.0000 mL/kg/h | INTRAVENOUS | Status: DC

## 2019-04-14 MED ORDER — FENTANYL CITRATE (PF) 100 MCG/2ML IJ SOLN
INTRAMUSCULAR | Status: AC
  Filled 2019-04-14: qty 2

## 2019-04-14 MED ORDER — HEPARIN (PORCINE) IN NACL 1000-0.9 UT/500ML-% IV SOLN
INTRAVENOUS | Status: DC | PRN
  Administered 2019-04-14 (×2): 500 mL

## 2019-04-14 MED ORDER — VERAPAMIL HCL 2.5 MG/ML IV SOLN: INTRAVENOUS | Status: DC | PRN

## 2019-04-14 MED ORDER — DIPHENHYDRAMINE HCL 50 MG/ML IJ SOLN
INTRAMUSCULAR | Status: DC | PRN
  Administered 2019-04-14: 25 mg via INTRAVENOUS

## 2019-04-14 MED ORDER — HEPARIN SODIUM (PORCINE) 1000 UNIT/ML IJ SOLN
INTRAMUSCULAR | Status: DC | PRN
  Administered 2019-04-14: 3500 [IU] via INTRAVENOUS

## 2019-04-14 MED ORDER — IOHEXOL 350 MG/ML SOLN
INTRAVENOUS | Status: DC | PRN
  Administered 2019-04-14: 90 mL

## 2019-04-14 MED ORDER — MIDAZOLAM HCL 2 MG/2ML IJ SOLN
INTRAMUSCULAR | Status: AC
  Filled 2019-04-14: qty 2

## 2019-04-14 MED ORDER — HYDRALAZINE HCL 20 MG/ML IJ SOLN: 10.0000 mg | INTRAMUSCULAR | Status: DC | PRN

## 2019-04-14 MED ORDER — SODIUM CHLORIDE 0.9% FLUSH: 3.0000 mL | Freq: Two times a day (BID) | INTRAVENOUS | Status: DC

## 2019-04-14 MED ORDER — VERAPAMIL HCL 2.5 MG/ML IV SOLN
INTRAVENOUS | Status: AC
  Filled 2019-04-14: qty 2

## 2019-04-14 MED ORDER — MIDAZOLAM HCL 2 MG/2ML IJ SOLN
INTRAMUSCULAR | Status: DC | PRN
  Administered 2019-04-14: 1 mg via INTRAVENOUS

## 2019-04-14 MED ORDER — SODIUM CHLORIDE 0.9 % WEIGHT BASED INFUSION
3.0000 mL/kg/h | INTRAVENOUS | Status: AC
  Administered 2019-04-14: 11:00:00 3 mL/kg/h via INTRAVENOUS

## 2019-04-14 MED ORDER — ACETAMINOPHEN 325 MG PO TABS: 650.0000 mg | ORAL_TABLET | ORAL | Status: DC | PRN

## 2019-04-14 MED ORDER — LABETALOL HCL 5 MG/ML IV SOLN: 10.0000 mg | INTRAVENOUS | Status: DC | PRN

## 2019-04-14 MED ORDER — LIDOCAINE HCL (PF) 1 % IJ SOLN
INTRAMUSCULAR | Status: AC
  Filled 2019-04-14: qty 30

## 2019-04-14 SURGICAL SUPPLY — 10 items
CATH 5FR JL3.5 JR4 ANG PIG MP (CATHETERS) ×2 IMPLANT
CATH INFINITI 5FR AL1 (CATHETERS) ×2 IMPLANT
DEVICE RAD COMP TR BAND LRG (VASCULAR PRODUCTS) ×2 IMPLANT
GLIDESHEATH SLEND SS 6F .021 (SHEATH) ×2 IMPLANT
GUIDEWIRE INQWIRE 1.5J.035X260 (WIRE) ×1 IMPLANT
INQWIRE 1.5J .035X260CM (WIRE) ×2
KIT HEART LEFT (KITS) ×2 IMPLANT
PACK CARDIAC CATHETERIZATION (CUSTOM PROCEDURE TRAY) ×2 IMPLANT
TRANSDUCER W/STOPCOCK (MISCELLANEOUS) ×2 IMPLANT
TUBING CIL FLEX 10 FLL-RA (TUBING) ×2 IMPLANT

## 2019-04-14 NOTE — Discharge Instructions (Signed)
DRINK PLENTY OF FLUIDS FOR THE NEXT 2-3 DAYS.  KEEP ARM ELEVATED THE REMAINDER OF THE DAY.  Radial Site Care  This sheet gives you information about how to care for yourself after your procedure. Your health care provider may also give you more specific instructions. If you have problems or questions, contact your health care provider. What can I expect after the procedure? After the procedure, it is common to have:  Bruising and tenderness at the catheter insertion area. Follow these instructions at home: Medicines  Take over-the-counter and prescription medicines only as told by your health care provider. Insertion site care 1. Follow instructions from your health care provider about how to take care of your insertion site. Make sure you: ? Wash your hands with soap and water before you change your bandage (dressing). If soap and water are not available, use hand sanitizer. ? Change your dressing as told by your health care provider. 2. Check your insertion site every day for signs of infection. Check for: ? Redness, swelling, or pain. ? Fluid or blood. ? Pus or a bad smell. ? Warmth. 3. Do not take baths, swim, or use a hot tub for 5 days. 4. You may shower 24-48 hours after the procedure. ? Remove the dressing and gently wash the site with plain soap and water. ? Pat the area dry with a clean towel. ? Do not rub the site. That could cause bleeding. 5. Do not apply powder or lotion to the site. Activity  1. For 24 hours after the procedure, or as directed by your health care provider: ? Do not flex or bend the affected arm. ? Do not push or pull heavy objects with the affected arm. ? Do not drive yourself home from the hospital or clinic. You may drive 24 hours after the procedure. ? Do not operate machinery or power tools. 2. Do not lift anything that is heavier than 10 lb for 5 days. 3. Ask your health care provider when it is okay to: ? Return to work or school. ? Resume  usual physical activities or sports. ? Resume sexual activity. General instructions  If the catheter site starts to bleed, raise your arm and put firm pressure on the site. If the bleeding does not stop, get help right away. This is a medical emergency.  If you went home on the same day as your procedure, a responsible adult should be with you for the first 24 hours after you arrive home.  Keep all follow-up visits as told by your health care provider. This is important. Contact a health care provider if:  You have a fever.  You have redness, swelling, or yellow drainage around your insertion site. Get help right away if:  You have unusual pain at the radial site.  The catheter insertion area swells very fast.  The insertion area is bleeding, and the bleeding does not stop when you hold steady pressure on the area.  Your arm or hand becomes pale, cool, tingly, or numb. These symptoms may represent a serious problem that is an emergency. Do not wait to see if the symptoms will go away. Get medical help right away. Call your local emergency services (911 in the [place] Do not drive yourself to the hospital. Summary  After the procedure, it is common to have bruising and tenderness at the site.  Follow instructions from your health care provider about how to take care of your radial site wound. Check the wound every  day for signs of infection.  Do not lift anything that is heavier than 10 lb for 5 days.  This information is not intended to replace advice given to you by your health care provider. Make sure you discuss any questions you have with your health care provider. Document Revised: 04/03/2017 Document Reviewed: 04/03/2017 Elsevier Patient Education  2020 [employer].

## 2019-04-14 NOTE — Interval H&P Note (Signed)
History and Physical Interval Note:  04/14/2019 11:31 AM  Sonia [name]  has presented today for surgery, with the diagnosis of coronary artery disease with angina.  The various methods of treatment have been discussed with the patient and family. After consideration of risks, benefits and other options for treatment, the patient has consented to  Procedure(s): LEFT HEART CATH AND CORONARY ANGIOGRAPHY (N/A) as a surgical intervention.  The patient's history has been reviewed, patient examined, no change in status, stable for surgery.  I have reviewed the patient's chart and labs.  Questions were answered to the patient's satisfaction.    Cath Lab Visit (complete for each Cath Lab visit)  Clinical Evaluation Leading to the Procedure:   ACS: No.  Non-ACS:    Anginal Classification: CCS III  Anti-ischemic medical therapy: No Therapy  Non-Invasive Test Results: No non-invasive testing performed  Prior CABG: No previous CABG        [name]

## 2019-04-21 ENCOUNTER — Telehealth: Payer: Self-pay | Admitting: Internal Medicine

## 2019-04-21 NOTE — Telephone Encounter (Signed)
ATC patient. No answer and no voicemail x1

## 2019-04-21 NOTE — Telephone Encounter (Signed)
Pt calling to see if she could samples of Breztri. Pt can be reached at [phone].

## 2019-04-22 MED ORDER — BREZTRI AEROSPHERE 160-9-4.8 MCG/ACT IN AERO: 2.0000 | INHALATION_SPRAY | Freq: Two times a day (BID) | RESPIRATORY_TRACT | 0 refills | Status: DC

## 2019-04-22 NOTE — Telephone Encounter (Signed)
Spoke with pt. She is aware of MW's response. Nothing further was needed. 

## 2019-04-22 NOTE — Telephone Encounter (Signed)
If her back is the problem her PCP should first order ortho eval or order it himself if he's comfortable with it but for 3rd party payers all the paperwork needs to be done correctly or she'll end up with a bill she won't be happy about

## 2019-04-22 NOTE — Telephone Encounter (Signed)
Spoke with pt. She is requesting samples of Breztri. Samples have been placed at the front. While speaking to the pt, she wanted to know if MW would order PT for her. Stated, "Being on lock down, my back is hurting due to my posture." If this is approved, pt wants to have her PT done at [facility] [address] in [place].  MW - please advise. Thanks.

## 2019-05-12 ENCOUNTER — Encounter: Payer: Self-pay | Admitting: Internal Medicine

## 2019-05-12 ENCOUNTER — Other Ambulatory Visit: Payer: Self-pay

## 2019-05-12 ENCOUNTER — Ambulatory Visit (INDEPENDENT_AMBULATORY_CARE_PROVIDER_SITE_OTHER): Payer: Medicare Other | Admitting: Internal Medicine

## 2019-05-12 DIAGNOSIS — J4540 Moderate persistent asthma, uncomplicated: Secondary | ICD-10-CM

## 2019-05-12 DIAGNOSIS — I25118 Atherosclerotic heart disease of native coronary artery with other forms of angina pectoris: Secondary | ICD-10-CM

## 2019-05-12 MED ORDER — BREZTRI AEROSPHERE 160-9-4.8 MCG/ACT IN AERO: 2.0000 | INHALATION_SPRAY | Freq: Two times a day (BID) | RESPIRATORY_TRACT | 0 refills | Status: DC

## 2019-05-12 NOTE — Progress Notes (Signed)
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 Asthma flare Chief Complaint  Patient presents with  . Acute Visit    Pt c/o cough, wheezing and increased SOB x 2 wks. She is coughing up clear sputum.   prev w/u showed bad reflux  maint at baseline symb 160 2 bid and no proair  02/24/16 called in 7 days of worse cough/ wheeze/sob> rx medrol not clear she took it Exp to gg child sick prior to onset of cough rec Take 4 for three days 3 for three days 2 for three days 1 for three days and stop  Start Pantoprazole (protonix) 40 mg   Take  30-60 min before first meal of the day and Pepcid (famotidine)  20 mg one @  bedtime until return to office - this is the best way to tell whether stomach acid is contributing to your problem.      07/31/2018 acute extended ov/Shamarr Faucett re:  Worse sob since   Nov 2018  Now on omeprazole 20mg  60 min ac  Early march 2020 worse sob despite symbicort 160 2 bid   No cough Sleeping on wedge  Room to room at home  rec Try omeprazole 20 mg x 2 x 30-60 min before your first meal Symbicort 160 Take 2 puffs first thing in am and then another 2 puffs about 12 hours later.  Only use your albuterol as a rescue medication  Please schedule a follow up visit in 3 months but call sooner if needed  with all medications /inhalers/ solutions in hand so we can verify exactly what you are taking. This includes all medications from all doctors and over the counters   11/05/2018  f/u ov/Rhydian Baldi re: chronic asthma on ppi 20 mg qd and symb 160 2bid / no meds in hand as  req Chief Complaint  Patient presents with  . Follow-up    Pt states her breathing has been some worse with the humid weather. She has used her albuterol inhaler 2 x since the last visit.   Dyspnea:  Aerobics/ swimming now again but doesn't do well out in heat/ humidity  Cough: no  Sleeping: no resp issues   SABA use: min hfa/ no neb 02: no rec Work on inhaler technique:   Please schedule a follow up visit in 6 months but call sooner if needed  with all medications /inhalers/ solutions in hand so we can verify exactly what you are taking. This includes all medications from all doctors and over the counters     02/03/2019  f/u ov/Quasean Frye re: chronic asthma, worse since September 2020 with overt gerd  Chief Complaint  Patient presents with  . Follow-up    Breathing is okay today. She is using her albuterol inhaler 1-2 x per wk on average.   Dyspnea:  Fine sitting still but doe across the room on symb 160 2bid and did not prev respond to singulair  Cough: worse bending over / non productive, on prilosec 20 mg ac qd  With overt HB Sleeping:  no resp symptoms  SABA use: 1-2x weekly when overdoes it, never pre-challenges / has neb but no meds for it x years. 02: none  rec  GERD   Stop symbicort and start Breztri Take 2 puffs first thing in am and then another 2 puffs about 12 hours later. Only use your albuterol as a rescue medication   Discuss with Dr Cristina Gong re optimal options to treat your reflux    05/12/2019  f/u ov/Jehan Bonano re:  Chronic asthma ? ACOS Chief Complaint  Patient presents with  . Follow-up    3 month f/u. States breathing has been stable since last visit.   Dyspnea:   MMRC2 = can't walk a nl pace on a flat grade s sob but does fine slow and flat  eg [facility] Cough: none Sleeping: no resp symptoms  SABA use: same need as previous 02: none  Overt hb/ buccini following and says "concerned about bones so doesn't want to use PPI"   No obvious day to day or daytime  variability or assoc excess/ purulent sputum or mucus plugs or hemoptysis or cp or chest tightness, subjective wheeze     Sleeping  without nocturnal  or early am exacerbation  of respiratory  c/o's or need for noct saba. Also denies any obvious fluctuation of symptoms with weather or environmental changes or other aggravating or alleviating factors except as outlined above   No unusual exposure hx or h/o childhood pna/ asthma or knowledge of premature birth.  Current Allergies, Complete Past Medical History, Past Surgical History, Family History, and Social History were reviewed in [employer] record.  ROS  The following are not active complaints unless bolded Hoarseness, sore throat, dysphagia, dental problems, itching, sneezing,  nasal congestion or discharge of excess mucus or purulent secretions, ear ache,   fever, chills, sweats, unintended wt loss or wt gain, classically pleuritic or exertional cp,  orthopnea pnd or arm/hand swelling  or leg swelling, presyncope, palpitations, abdominal pain, anorexia, nausea, vomiting, diarrhea  or change in bowel habits or change in bladder habits, change in stools or change in urine, dysuria, hematuria,  rash, arthralgias, visual complaints, headache, numbness, weakness or ataxia or problems with walking or coordination,  change in mood or  memory.        Current Meds  Medication Sig  . albuterol (PROVENTIL HFA;VENTOLIN HFA) 108 (90 Base) MCG/ACT inhaler INHALE 2 PUFFS INTO LUNGS EVERY 6 HOURS AS NEEDED FOR WHEEZING OR SHORTNESS OF BREATH (Patient taking differently: Inhale 2 puffs into the lungs every 6 (six) hours as needed for wheezing or shortness of breath. )  . aspirin EC 81 MG tablet Take 1 tablet (81 mg total) by mouth daily.  . Budeson-Glycopyrrol-Formoterol (BREZTRI AEROSPHERE) 160-9-4.8 MCG/ACT AERO Inhale 2 puffs into the lungs 2 (two) times daily.  . calcium carbonate (TUMS - DOSED IN MG ELEMENTAL CALCIUM) 500 MG  chewable tablet Chew 500 mg by mouth daily as needed for indigestion or heartburn.  . Cholecalciferol (VITAMIN D) 50 MCG (2000 UT) tablet Take 8,000 Units by mouth daily.  . cromolyn (NASALCROM) 5.2 MG/ACT nasal spray Place 2 sprays into both nostrils daily as needed for allergies.  . Cyanocobalamin (B-12) 5000 MCG CAPS Take 5,000 mcg by mouth every Monday, Wednesday, and Friday.  . nitroGLYCERIN (NITROSTAT) 0.4 MG SL tablet DISSOLVE ONE TABLET UNDER THE TONGUE EVERY 5 MINUTES AS NEEDED FOR CHEST PAIN. (CHEST PAIN OR SHORTNESS OF BREATH) (Patient taking differently: Place 0.4 mg under the  tongue every 5 (five) minutes as needed for chest pain. )  . predniSONE (DELTASONE) 50 MG tablet Take as directed for contrast allergy  . rosuvastatin (CRESTOR) 5 MG tablet Take 5 mg by mouth 2 (two) times a week.   Marland Kitchen SYNTHROID 112 MCG tablet Take 112 mcg by mouth daily.   . tamoxifen (NOLVADEX) 20 MG tablet Take 20 mg by mouth daily.                                   Physical Exam:  05/12/2019         152  02/03/2019     153 11/05/2018       152  07/31/2018       154   03/07/16 160 lb 12.8 oz (72.9 kg)  01/04/16 160 lb (72.6 kg)  04/26/15 159 lb 4.8 oz (72.3 kg)     Vital signs reviewed  05/12/2019  - Note at rest 02 sats  98% on RA     HEENT : pt wearing mask not removed for exam due to covid - 19 concerns.   NECK :  without JVD/Nodes/TM/ nl carotid upstrokes bilaterally   LUNGS: no acc muscle use,  Min barrel  contour chest wall with bilateral  slightly decreased bs s audible wheeze and  without cough on insp or exp maneuvers and min  Hyperresonant  to  percussion bilaterally     CV:  RRR  no s3 or murmur or increase in P2, and no edema   ABD:  soft and nontender with pos end  insp Hoover's  in the supine position. No bruits or organomegaly appreciated, bowel sounds nl  MS:   Nl gait/  ext warm without deformities, calf tenderness, cyanosis or clubbing No obvious joint  restrictions   SKIN: warm and dry without lesions    NEURO:  alert, approp, nl sensorium with  no motor or cerebellar deficits apparent.

## 2019-05-12 NOTE — Patient Instructions (Signed)
No change in medications    Please schedule a follow up visit in 6  months but call sooner if needed  

## 2019-05-13 ENCOUNTER — Encounter: Payer: Self-pay | Admitting: Internal Medicine

## 2019-05-13 NOTE — Assessment & Plan Note (Signed)
Informed at [facility] asthma center severe gerd likely the cause with large [place] on cxr but "turned surgery down" PFT's  07/30/11   FEV1  1.25 (61 % ) ratio 0.47  p 6 % improvement from saba p ? prior to study with DLCO  94 % and  corrects to 141  % for alv volume c classic f/v loop for  chronic asthma    - FENO 03/07/2016  =   9 on symb 160 2bid during acute flare - 07/31/2018     75% > continue symb 160 2bid  - 11/05/2018  After extensive coaching inhaler device,  effectiveness =    75% (short ti)  - 11/05/18  Alpha one AT :   MM   Level 179  - 02/03/2019  After extensive coaching inhaler device,  effectiveness =    75% (short Ti) > rec trial of breztri 2bid > some better 05/12/2019 so continue  - The proper method of use, as well as anticipated side effects, of a metered-dose inhaler are discussed and demonstrated to the patient.    All goals of chronic asthma control met including optimal function (though not nl) and elimination of symptoms with minimal need for rescue therapy.  Contingencies discussed in full including contacting this office immediately if not controlling the symptoms using the rule of two's.             Each maintenance medication was reviewed in detail including emphasizing most importantly the difference between maintenance and prns and under what circumstances the prns are to be triggered using an action plan format where appropriate.  Total time for H and P, chart review, counseling, teaching device and generating customized AVS unique to this office visit / charting = 20 min

## 2019-05-17 IMAGING — MG MM CLIP PLACEMENT
4 series · 4 of 12 positions shown · non-contrast
Comparison: Previous exam(s).

CLINICAL DATA: Evaluate biopsy marker

EXAM:
DIAGNOSTIC RIGHT MAMMOGRAM POST STEREOTACTIC BIOPSY

[R CC synth-2D]
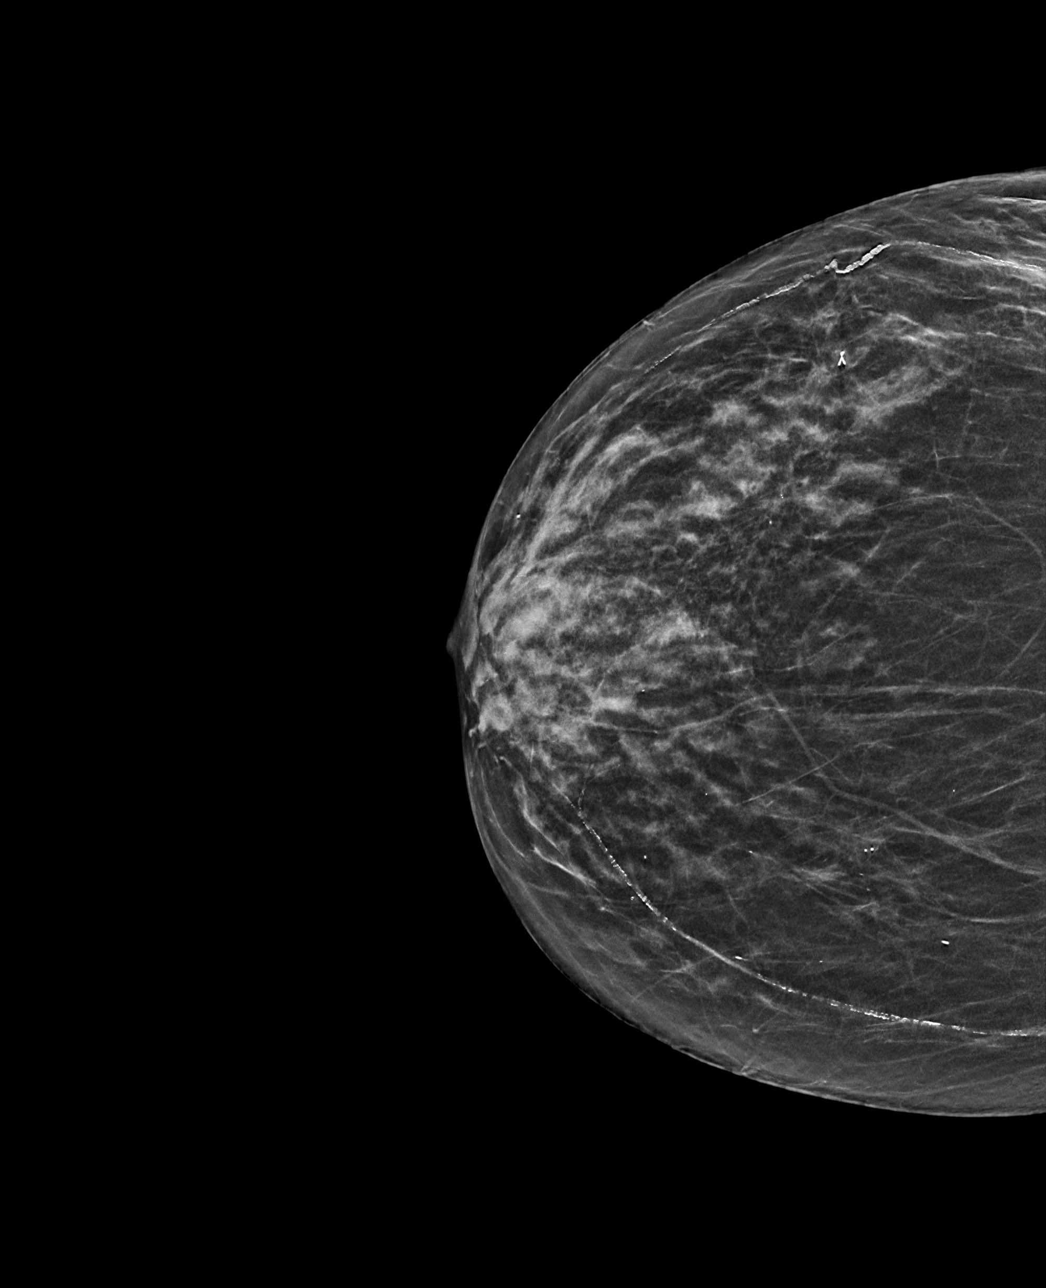

[R ML synth-2D]
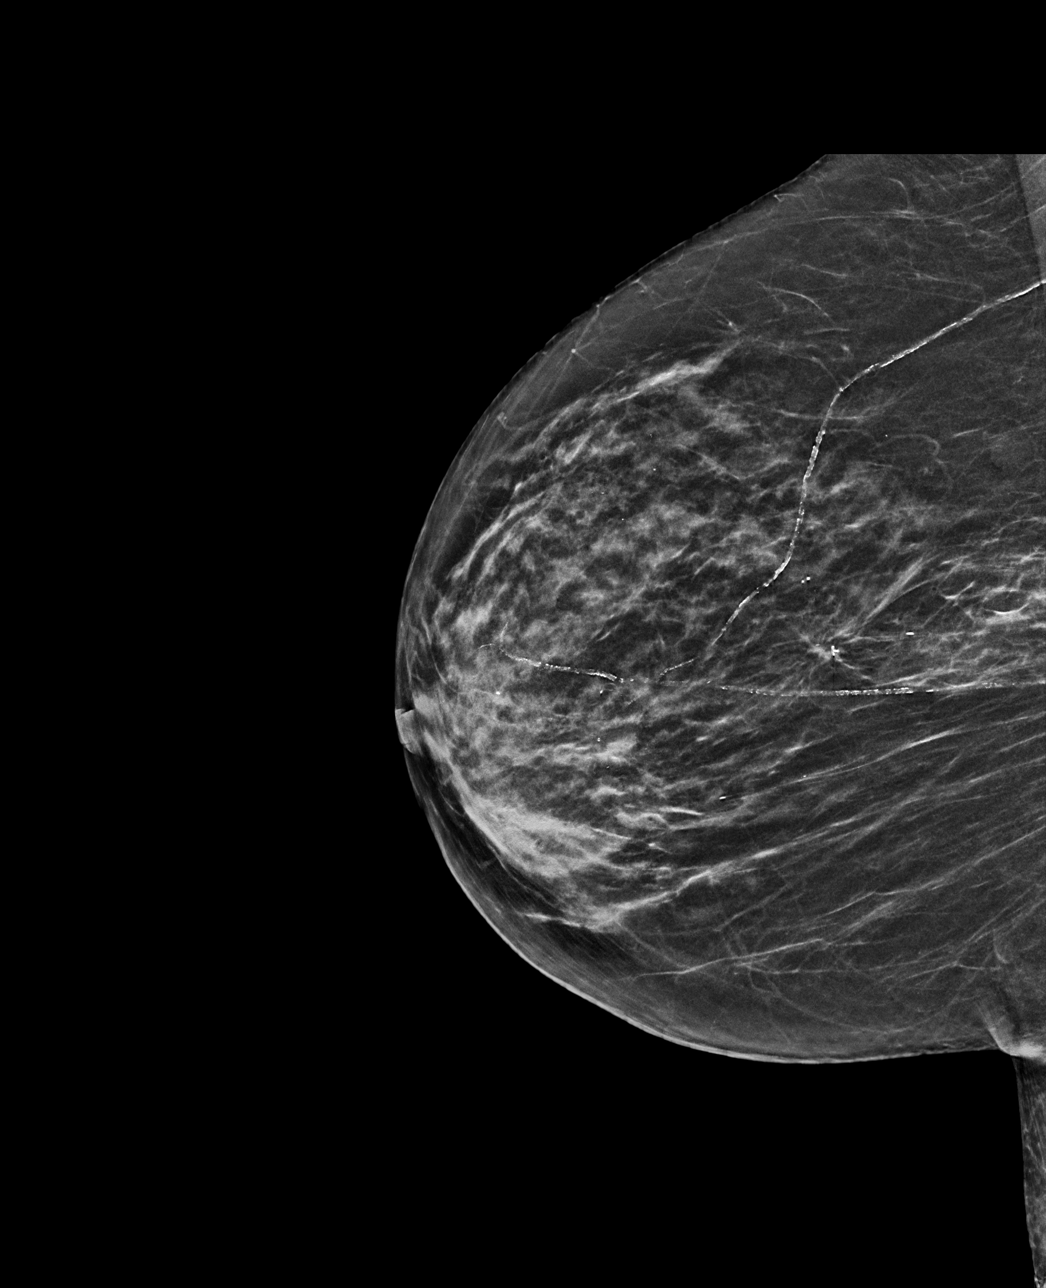

[R CC tomo · tomo slice 25/48.0]
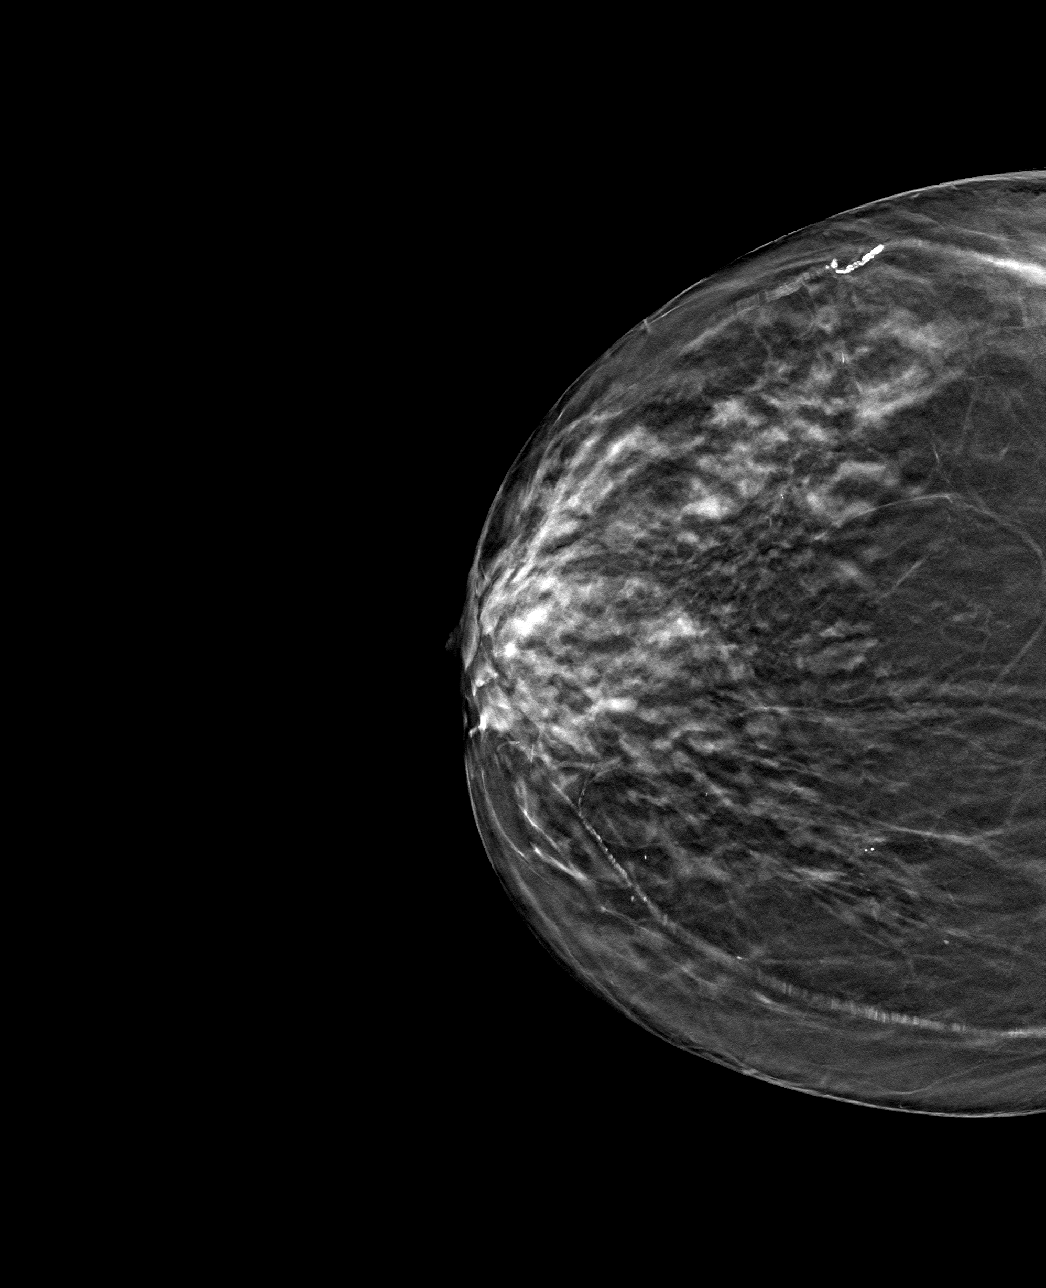

[R ML tomo · tomo slice 25/50.0]
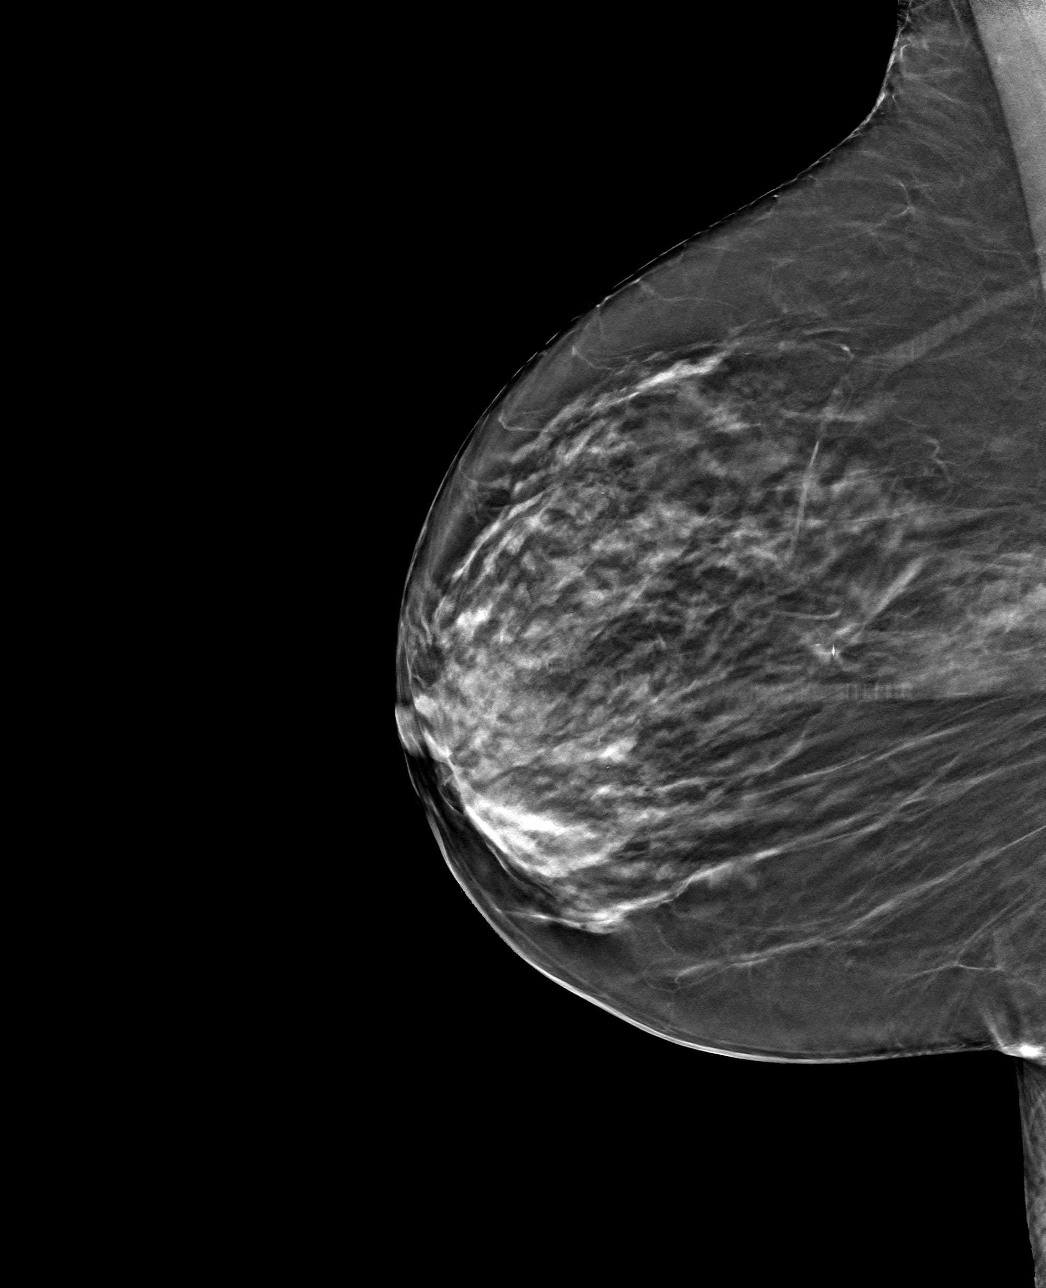

[4 of 12 positions shown; findings below may reference images not displayed]

FINDINGS: Mammographic images were obtained following ultrasound guided biopsy
of right breast distortion. The biopsy clip is 8 mm medial to the
biopsied distortion.
IMPRESSION: The biopsy clip is 8 mm medial to the biopsied distortion on the CC
view and overlies the distortion on the 90 degree lateral view.

Final Assessment: Post Procedure Mammograms for Marker Placement

## 2019-06-03 NOTE — Progress Notes (Signed)
No show

## 2019-06-04 ENCOUNTER — Inpatient Hospital Stay: Payer: Medicare Other

## 2019-06-04 ENCOUNTER — Inpatient Hospital Stay: Payer: Medicare Other | Attending: Oncology | Admitting: Oncology

## 2019-06-04 ENCOUNTER — Encounter: Payer: Self-pay | Admitting: Oncology

## 2019-06-09 DIAGNOSIS — E7800 Pure hypercholesterolemia, unspecified: Secondary | ICD-10-CM | POA: Diagnosis not present

## 2019-06-09 DIAGNOSIS — Z87892 Personal history of anaphylaxis: Secondary | ICD-10-CM | POA: Diagnosis not present

## 2019-06-09 DIAGNOSIS — N183 Chronic kidney disease, stage 3 unspecified: Secondary | ICD-10-CM | POA: Diagnosis not present

## 2019-06-09 DIAGNOSIS — Z862 Personal history of diseases of the blood and blood-forming organs and certain disorders involving the immune mechanism: Secondary | ICD-10-CM | POA: Diagnosis not present

## 2019-06-09 DIAGNOSIS — M542 Cervicalgia: Secondary | ICD-10-CM | POA: Diagnosis not present

## 2019-06-12 ENCOUNTER — Other Ambulatory Visit: Payer: Self-pay

## 2019-06-12 ENCOUNTER — Telehealth: Payer: Self-pay | Admitting: Nurse Practitioner

## 2019-06-12 ENCOUNTER — Ambulatory Visit (INDEPENDENT_AMBULATORY_CARE_PROVIDER_SITE_OTHER): Payer: Medicare Other | Admitting: Cardiovascular Disease

## 2019-06-12 VITALS — BP 113/63 | HR 67 | Ht 64.0 in | Wt 151.0 lb

## 2019-06-12 DIAGNOSIS — I2510 Atherosclerotic heart disease of native coronary artery without angina pectoris: Secondary | ICD-10-CM

## 2019-06-12 DIAGNOSIS — E785 Hyperlipidemia, unspecified: Secondary | ICD-10-CM

## 2019-06-12 NOTE — Progress Notes (Signed)
Virtual Visit via Telephone Note   This visit type was conducted due to national recommendations for restrictions regarding the COVID-19 Pandemic (e.g. social distancing) in an effort to limit this patient's exposure and mitigate transmission in our community.  Due to her co-morbid illnesses, this patient is at least at moderate risk for complications without adequate follow up.  This format is felt to be most appropriate for this patient at this time.  The patient did not have access to video technology/had technical difficulties with video requiring transitioning to audio format only (telephone).  All issues noted in this document were discussed and addressed.  No physical exam could be performed with this format.  Please refer to the patient's chart for her  consent to telehealth for [facility].   Date:  06/12/2019   ID:  Sonia [name], DOB [birth year], MRN [id number]  Patient Location: Home Provider Location: Office  PCP:  [name], MD  Cardiologist:  [name], MD  Electrophysiologist:  None   Evaluation Performed:  Follow-Up Visit  Chief Complaint:  Follow up- CAD   History of Present Illness: 79 yo female with history of CAD, COPD, GERD and hypothyroidism who is being seen today by virtual e-visit due to the [place] pandemic. She was admitted to [facility] May 2018 with c/o left arm pain and found to have a NSTEMI. Cardiac cath May 2018 with severe stenosis in the mid RCA treated with a drug eluting stent. There was a moderate stenosis in the mid LAD. Echo May 2018 with normal LV systolic function, mild valve disease. She was discharged on Brilinta and readmitted one week later with a GI bleed. EGD showed gastritis. She was changed from Brilinta to Plavix and bleeding resolved. Chest pain and dyspnea at times felt to be due to her asthma as it improved with her inhalers. She has not tolerate statins. She cancelled a stress test arranged in March 2019. She was  found to have breast cancer and has undergone lumpectomy in 2020. She had left arm pain in early 2021. Cardiac cath 04/14/19 with non-obstructive disease in the LAD and Circumflex and patent stent in the RCA with minimal restenosis. LV systolic function was normal.   She is being evaluated today by telephone visit. oday for follow up. The patient denies any chest pain, dyspnea, palpitations, lower extremity edema, orthopnea, PND, dizziness, near syncope or syncope.   The patient does not have symptoms concerning for COVID-19 infection (fever, chills, cough, or new shortness of breath).    Primary Care Physician: [name], MD  Past Medical History:  Diagnosis Date   Arthritis    Asthma    daily and prn inhalers; states good control currently   CAD S/P percutaneous coronary angioplasty 07/15/2016   COPD (chronic obstructive pulmonary disease) (HCC)    GERD (gastroesophageal reflux disease)    no current med.   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   Vitamin D deficiency     Past Surgical History:  Procedure Laterality Date   ABDOMINAL HYSTERECTOMY     partial   APPENDECTOMY     BREAST BIOPSY Left    BREAST CYST EXCISION Left    BREAST EXCISIONAL BIOPSY Left    BREAST LUMPECTOMY WITH RADIOACTIVE SEED  LOCALIZATION Left 04/07/2015   Procedure: BREAST LUMPECTOMY WITH RADIOACTIVE SEED LOCALIZATION;  Surgeon: Erroll Luna, MD;  Location: [place];  Service: General;  Laterality: Left;   BREAST LUMPECTOMY WITH RADIOACTIVE SEED LOCALIZATION Right 08/13/2018   Procedure: RIGHT BREAST LUMPECTOMY WITH RADIOACTIVE SEED LOCALIZATION;  Surgeon: Rolm Bookbinder, MD;  Location: [place];  Service: General;  Laterality: Right;   CORONARY STENT INTERVENTION N/A  07/15/2016   Procedure: Coronary Stent Intervention;  Surgeon: Sherren Mocha, MD;  Location: [place] [facility];  Service: Cardiovascular;  Laterality: N/A;   ESOPHAGOGASTRODUODENOSCOPY (EGD) WITH PROPOFOL Left 07/25/2016   Procedure: ESOPHAGOGASTRODUODENOSCOPY (EGD) WITH PROPOFOL;  Surgeon: Ronnette Juniper, MD;  Location: [place];  Service: Gastroenterology;  Laterality: Left;   KNEE ARTHROSCOPY Right 12/07/2003   LAPAROSCOPIC SALPINGO OOPHERECTOMY Right 09/08/2003   LEFT HEART CATH N/A 07/15/2016   Procedure: Left Heart Cath;  Surgeon: Sherren Mocha, MD;  Location: [place] [facility];  Service: Cardiovascular;  Laterality: N/A;   LEFT HEART CATH AND CORONARY ANGIOGRAPHY N/A 04/14/2019   Procedure: LEFT HEART CATH AND CORONARY ANGIOGRAPHY;  Surgeon: Burnell Blanks, MD;  Location: [place] [facility];  Service: Cardiovascular;  Laterality: N/A;   LYSIS OF ADHESION  09/08/2003   NASAL SINUS SURGERY  age 8   RECTAL POLYPECTOMY  10/02/2006    Current Outpatient Medications  Medication Sig Dispense Refill   albuterol (VENTOLIN HFA) 108 (90 Base) MCG/ACT inhaler Inhale 2 puffs into the lungs every 6 (six) hours as needed for wheezing or shortness of breath.     aspirin EC 81 MG tablet Take 1 tablet (81 mg total) by mouth daily. 90 tablet 3   calcium carbonate (TUMS - DOSED IN MG ELEMENTAL CALCIUM) 500 MG chewable tablet Chew 500 mg by mouth daily as needed for indigestion or heartburn.     Cholecalciferol 25 MCG (1000 UT) capsule Take 10,000 Units by mouth 3 (three) times a week.     cromolyn (NASALCROM) 5.2 MG/ACT nasal spray Place 2 sprays into both nostrils daily as needed for allergies.     Cyanocobalamin (B-12) 5000 MCG CAPS Take 5,000 mcg by mouth every Monday, Wednesday, and Friday.     nitroGLYCERIN (NITROSTAT) 0.4 MG SL tablet Place 0.4 mg under the tongue every 5 (five) minutes as needed for chest pain.     rosuvastatin (CRESTOR) 5 MG tablet Take 5 mg by mouth 2 (two)  times a week.      SYNTHROID 112 MCG tablet Take 112 mcg by mouth daily.      tamoxifen (NOLVADEX) 20 MG tablet Take 20 mg by mouth daily.     No current facility-administered medications for this visit.    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  Tobacco Use   Smoking status: Never Smoker   Smokeless tobacco: Never Used  Substance and Sexual Activity   Alcohol use: Not Currently    Comment: rarely   Drug use: No   Sexual  activity: Not on file  Other Topics Concern   Not on file  Social History Narrative   Not on file   Social Determinants of Health   Financial Resource Strain:    Difficulty of Paying Living Expenses:   Food Insecurity:    Worried About Charity fundraiser in the Last Year:    Arboriculturist in the Last Year:   Transportation Needs:    Film/video editor (Medical):    Lack of Transportation (Non-Medical):   Physical Activity:    Days of Exercise per Week:    Minutes of Exercise per Session:   Stress:    Feeling of Stress :   Social Connections:    Frequency of Communication with Friends and Family:    Frequency of Social Gatherings with Friends and Family:    Attends Religious Services:    Active Member of Clubs or Organizations:    Attends Music therapist:    Marital Status:   Intimate Partner Violence:    Fear of Current or Ex-Partner:    Emotionally Abused:    Physically Abused:    Sexually Abused:     Family History  Problem  Relation Age of Onset   Asthma Mother    Heart disease Mother    Asthma Sister    Heart disease Sister    Breast cancer Sister 13   Heart disease Father    Kidney disease Father    Prostate cancer Brother        dx. 84s   Heart attack Brother 49   Alzheimer's disease Brother 67   Breast cancer Maternal Aunt 70   Stomach cancer Maternal Grandfather 26   Asthma Sister    Memory loss Sister    Heart attack Son 22   Spinal muscular atrophy Grandchild        type II   Cancer Other 40       niece dx. ca of salivary gland    Breast cancer Other 48       niece; negative genetic testing 10 years ago   Melanoma Other    Alzheimer's disease Maternal Aunt    Cervical cancer Cousin        maternal 1st cousin dx. at 78   Breast cancer Cousin 72       maternal 1st cousin   Lung cancer Cousin 62       maternal 1st cousin; smoker   Heart disease Cousin    Leukemia Cousin 14       paternal 1st cousin   Breast cancer Other        paternal great grandmother (PGF's mother) dx. late 58s-early 24s   Coronary artery disease Other     Review of Systems:  As stated in the HPI and otherwise negative.   BP 113/63 (BP Location: Left Arm, Patient Position: Sitting, Cuff Size: Normal)    Pulse 67    Ht 5\' 4"  (1.626 m)    Wt 151 lb (68.5 kg)    BMI 25.92 kg/m   Physical Examination: Phone visit. No exam  Echo 07/16/16: - Left ventricle: The cavity size was normal. Systolic function was   normal. The estimated ejection fraction was in the range of 60%   to 65%. Wall motion was normal; there were no regional wall   motion abnormalities. Features are consistent with a pseudonormal   left ventricular filling pattern, with concomitant abnormal   relaxation and increased filling pressure (grade  2 diastolic   dysfunction). Doppler parameters are consistent with high   ventricular filling pressure. - Mitral valve: Calcified annulus. There was mild regurgitation. - Tricuspid  valve: There was mild regurgitation. - Pulmonic valve: There was trivial regurgitation. - Pulmonary arteries: PA peak pressure: 37 mm Hg (S). Impressions: - The right ventricular systolic pressure was increased consistent   with mild pulmonary hypertension.  EKG:  EKG is not ordered today. The ekg ordered today demonstrates   Recent Labs: 07/31/2018: Pro B Natriuretic peptide (BNP) 196.0; TSH 0.10 04/10/2019: BUN 13; Creatinine, Ser 1.07; Hemoglobin 14.3; Platelets 168; Potassium 4.6; Sodium 144   Lipid Panel    Component Value Date/Time   CHOL 174 07/26/2017 1001   TRIG 65 07/26/2017 1001   HDL 76 07/26/2017 1001   CHOLHDL 2.5 01/16/2017 1036   CHOLHDL 3.0 07/13/2016 2331   VLDL 12 07/13/2016 2331   LDLCALC 85 07/26/2017 1001     Wt Readings from Last 3 Encounters:  06/12/19 151 lb (68.5 kg)  05/12/19 152 lb (68.9 kg)  04/14/19 151 lb (68.5 kg)     Other studies Reviewed: Additional studies/ records that were reviewed today include: . Review of the above records demonstrates:   Assessment and Plan:   1. CAD with angina: No chest pain. Continue ASA and statin. She only tolerates low doses of statins. Currently taking Crestor 5 mg two days per week.   2. HLD: LDL near goal in 2019. She will have fasting lipids and LFTs in primary care later this month. Continue statin. If LDL not at goal of 70, will ask her to take Crestor 3-4 days per week.    Current medicines are reviewed at length with the patient today.  The patient does not have concerns regarding medicines.  The following changes have been made:  no change  Labs/ tests ordered today include:   No orders of the defined types were placed in this encounter.    Disposition:   FU with me in 12 months   Signed, [name], MD 06/12/2019 3:09 PM    [facility] [address], [place], [place]  [address] Phone: [phone]; Fax: [phone]

## 2019-06-12 NOTE — Patient Instructions (Signed)
Medication Instructions:  Your physician recommends that you continue on your current medications as directed. Please refer to the Current Medication list given to you today.  *If you need a refill on your cardiac medications before your next appointment, please call your pharmacy*   Lab Work: None Ordered If you have labs (blood work) drawn today and your tests are completely normal, you will receive your results only by: MyChart Message (if you have MyChart) OR A paper copy in the mail If you have any lab test that is abnormal or we need to change your treatment, we will call you to review the results.   Testing/Procedures: None Ordered   Follow-Up: At [facility], you and your health needs are our priority.  As part of our continuing mission to provide you with exceptional heart care, we have created designated Provider Care Teams.  These Care Teams include your primary Cardiologist (physician) and Advanced Practice Providers (APPs -  Physician Assistants and Nurse Practitioners) who all work together to provide you with the care you need, when you need it.  We recommend signing up for the patient portal called "MyChart".  Sign up information is provided on this After Visit Summary.  MyChart is used to connect with patients for Virtual Visits (Telemedicine).  Patients are able to view lab/test results, encounter notes, upcoming appointments, etc.  Non-urgent messages can be sent to your provider as well.   To learn more about what you can do with MyChart, go to [email or web address].    Your next appointment:   1 year(s)  The format for your next appointment:   In Person  Provider:   You may see Christopher McAlhany, MD or one of the following Advanced Practice Providers on your designated Care Team:   Dayna Dunn, PA-C Michele Lenze, PA-C   

## 2019-06-12 NOTE — Telephone Encounter (Signed)
  Patient Consent for Virtual Visit     {   Sonia [name] has provided verbal consent on 06/12/2019 for a virtual visit (video or telephone).   CONSENT FOR VIRTUAL VISIT FOR:  Sonia [name]  By participating in this virtual visit I agree to the following:  I hereby voluntarily request, consent and authorize [facility] and its employed or contracted physicians, physician assistants, nurse practitioners or other licensed health care professionals (the Practitioner), to provide me with telemedicine health care services (the "Services") as deemed necessary by the treating Practitioner. I acknowledge and consent to receive the Services by the Practitioner via telemedicine. I understand that the telemedicine visit will involve communicating with the Practitioner through live audiovisual communication technology and the disclosure of certain medical information by electronic transmission. I acknowledge that I have been given the opportunity to request an in-person assessment or other available alternative prior to the telemedicine visit and am voluntarily participating in the telemedicine visit.  I understand that I have the right to withhold or withdraw my consent to the use of telemedicine in the course of my care at any time, without affecting my right to future care or treatment, and that the Practitioner or I may terminate the telemedicine visit at any time. I understand that I have the right to inspect all information obtained and/or recorded in the course of the telemedicine visit and may receive copies of available information for a reasonable fee.  I understand that some of the potential risks of receiving the Services via telemedicine include:  Marland Kitchen Delay or interruption in medical evaluation due to technological equipment failure or disruption; . Information transmitted may not be sufficient (e.g. poor resolution of images) to allow for appropriate medical decision making by  the Practitioner; and/or  . In rare instances, security protocols could fail, causing a breach of personal health information.  Furthermore, I acknowledge that it is my responsibility to provide information about my medical history, conditions and care that is complete and accurate to the best of my ability. I acknowledge that Practitioner's advice, recommendations, and/or decision may be based on factors not within their control, such as incomplete or inaccurate data provided by me or distortions of diagnostic images or specimens that may result from electronic transmissions. I understand that the practice of medicine is not an exact science and that Practitioner makes no warranties or guarantees regarding treatment outcomes. I acknowledge that a copy of this consent can be made available to me via my patient portal ([place]), or I can request a printed copy by calling the office of [place].    I understand that my insurance will be billed for this visit.   I have read or had this consent read to me. . I understand the contents of this consent, which adequately explains the benefits and risks of the Services being provided via telemedicine.  . I have been provided ample opportunity to ask questions regarding this consent and the Services and have had my questions answered to my satisfaction. . I give my informed consent for the services to be provided through the use of telemedicine in my medical care

## 2019-06-22 DIAGNOSIS — E063 Autoimmune thyroiditis: Secondary | ICD-10-CM | POA: Diagnosis not present

## 2019-06-22 DIAGNOSIS — E785 Hyperlipidemia, unspecified: Secondary | ICD-10-CM | POA: Diagnosis not present

## 2019-06-22 DIAGNOSIS — E039 Hypothyroidism, unspecified: Secondary | ICD-10-CM | POA: Diagnosis not present

## 2019-06-22 DIAGNOSIS — M546 Pain in thoracic spine: Secondary | ICD-10-CM | POA: Diagnosis not present

## 2019-06-22 DIAGNOSIS — M542 Cervicalgia: Secondary | ICD-10-CM | POA: Diagnosis not present

## 2019-06-22 DIAGNOSIS — E162 Hypoglycemia, unspecified: Secondary | ICD-10-CM | POA: Diagnosis not present

## 2019-06-22 DIAGNOSIS — R7301 Impaired fasting glucose: Secondary | ICD-10-CM | POA: Diagnosis not present

## 2019-06-22 DIAGNOSIS — M545 Low back pain: Secondary | ICD-10-CM | POA: Diagnosis not present

## 2019-06-23 DIAGNOSIS — E162 Hypoglycemia, unspecified: Secondary | ICD-10-CM | POA: Diagnosis not present

## 2019-06-23 DIAGNOSIS — E785 Hyperlipidemia, unspecified: Secondary | ICD-10-CM | POA: Diagnosis not present

## 2019-06-23 DIAGNOSIS — E063 Autoimmune thyroiditis: Secondary | ICD-10-CM | POA: Diagnosis not present

## 2019-06-23 DIAGNOSIS — E039 Hypothyroidism, unspecified: Secondary | ICD-10-CM | POA: Diagnosis not present

## 2019-06-23 DIAGNOSIS — R7301 Impaired fasting glucose: Secondary | ICD-10-CM | POA: Diagnosis not present

## 2019-06-25 DIAGNOSIS — M546 Pain in thoracic spine: Secondary | ICD-10-CM | POA: Diagnosis not present

## 2019-06-25 DIAGNOSIS — M545 Low back pain: Secondary | ICD-10-CM | POA: Diagnosis not present

## 2019-06-25 DIAGNOSIS — M542 Cervicalgia: Secondary | ICD-10-CM | POA: Diagnosis not present

## 2019-06-30 DIAGNOSIS — M546 Pain in thoracic spine: Secondary | ICD-10-CM | POA: Diagnosis not present

## 2019-06-30 DIAGNOSIS — M545 Low back pain: Secondary | ICD-10-CM | POA: Diagnosis not present

## 2019-06-30 DIAGNOSIS — M542 Cervicalgia: Secondary | ICD-10-CM | POA: Diagnosis not present

## 2019-07-02 DIAGNOSIS — M542 Cervicalgia: Secondary | ICD-10-CM | POA: Diagnosis not present

## 2019-07-02 DIAGNOSIS — M545 Low back pain: Secondary | ICD-10-CM | POA: Diagnosis not present

## 2019-07-02 DIAGNOSIS — M546 Pain in thoracic spine: Secondary | ICD-10-CM | POA: Diagnosis not present

## 2019-07-03 DIAGNOSIS — R1013 Epigastric pain: Secondary | ICD-10-CM | POA: Diagnosis not present

## 2019-07-03 DIAGNOSIS — K591 Functional diarrhea: Secondary | ICD-10-CM | POA: Diagnosis not present

## 2019-07-03 DIAGNOSIS — Z86010 Personal history of colonic polyps: Secondary | ICD-10-CM | POA: Diagnosis not present

## 2019-07-03 DIAGNOSIS — Z862 Personal history of diseases of the blood and blood-forming organs and certain disorders involving the immune mechanism: Secondary | ICD-10-CM | POA: Diagnosis not present

## 2019-07-09 DIAGNOSIS — M545 Low back pain: Secondary | ICD-10-CM | POA: Diagnosis not present

## 2019-07-09 DIAGNOSIS — M546 Pain in thoracic spine: Secondary | ICD-10-CM | POA: Diagnosis not present

## 2019-07-09 DIAGNOSIS — M542 Cervicalgia: Secondary | ICD-10-CM | POA: Diagnosis not present

## 2019-07-14 DIAGNOSIS — M542 Cervicalgia: Secondary | ICD-10-CM | POA: Diagnosis not present

## 2019-07-14 DIAGNOSIS — M545 Low back pain: Secondary | ICD-10-CM | POA: Diagnosis not present

## 2019-07-14 DIAGNOSIS — M546 Pain in thoracic spine: Secondary | ICD-10-CM | POA: Diagnosis not present

## 2019-07-16 DIAGNOSIS — M542 Cervicalgia: Secondary | ICD-10-CM | POA: Diagnosis not present

## 2019-07-16 DIAGNOSIS — M545 Low back pain: Secondary | ICD-10-CM | POA: Diagnosis not present

## 2019-07-16 DIAGNOSIS — M546 Pain in thoracic spine: Secondary | ICD-10-CM | POA: Diagnosis not present

## 2019-07-21 DIAGNOSIS — M545 Low back pain: Secondary | ICD-10-CM | POA: Diagnosis not present

## 2019-07-21 DIAGNOSIS — M542 Cervicalgia: Secondary | ICD-10-CM | POA: Diagnosis not present

## 2019-07-21 DIAGNOSIS — M546 Pain in thoracic spine: Secondary | ICD-10-CM | POA: Diagnosis not present

## 2019-07-28 DIAGNOSIS — M542 Cervicalgia: Secondary | ICD-10-CM | POA: Diagnosis not present

## 2019-07-28 DIAGNOSIS — M545 Low back pain: Secondary | ICD-10-CM | POA: Diagnosis not present

## 2019-07-28 DIAGNOSIS — M546 Pain in thoracic spine: Secondary | ICD-10-CM | POA: Diagnosis not present

## 2019-07-29 ENCOUNTER — Other Ambulatory Visit: Payer: Self-pay | Admitting: Oncology

## 2019-07-29 DIAGNOSIS — Z9889 Other specified postprocedural states: Secondary | ICD-10-CM

## 2019-07-30 DIAGNOSIS — M545 Low back pain: Secondary | ICD-10-CM | POA: Diagnosis not present

## 2019-07-30 DIAGNOSIS — M542 Cervicalgia: Secondary | ICD-10-CM | POA: Diagnosis not present

## 2019-07-30 DIAGNOSIS — M546 Pain in thoracic spine: Secondary | ICD-10-CM | POA: Diagnosis not present

## 2019-08-04 DIAGNOSIS — M545 Low back pain: Secondary | ICD-10-CM | POA: Diagnosis not present

## 2019-08-04 DIAGNOSIS — M542 Cervicalgia: Secondary | ICD-10-CM | POA: Diagnosis not present

## 2019-08-04 DIAGNOSIS — M546 Pain in thoracic spine: Secondary | ICD-10-CM | POA: Diagnosis not present

## 2019-08-05 DIAGNOSIS — H5213 Myopia, bilateral: Secondary | ICD-10-CM | POA: Diagnosis not present

## 2019-08-05 DIAGNOSIS — H2513 Age-related nuclear cataract, bilateral: Secondary | ICD-10-CM | POA: Diagnosis not present

## 2019-08-06 DIAGNOSIS — M542 Cervicalgia: Secondary | ICD-10-CM | POA: Diagnosis not present

## 2019-08-06 DIAGNOSIS — M545 Low back pain: Secondary | ICD-10-CM | POA: Diagnosis not present

## 2019-08-06 DIAGNOSIS — M546 Pain in thoracic spine: Secondary | ICD-10-CM | POA: Diagnosis not present

## 2019-08-13 DIAGNOSIS — M546 Pain in thoracic spine: Secondary | ICD-10-CM | POA: Diagnosis not present

## 2019-08-13 DIAGNOSIS — M542 Cervicalgia: Secondary | ICD-10-CM | POA: Diagnosis not present

## 2019-08-13 DIAGNOSIS — M545 Low back pain: Secondary | ICD-10-CM | POA: Diagnosis not present

## 2019-08-15 IMAGING — DX CHEST - 2 VIEW
2 series · 2 of 2 positions shown · non-contrast
Comparison: 06/07/2017

CLINICAL DATA: Dyspnea

EXAM:
CHEST - 2 VIEW

[chest pa]
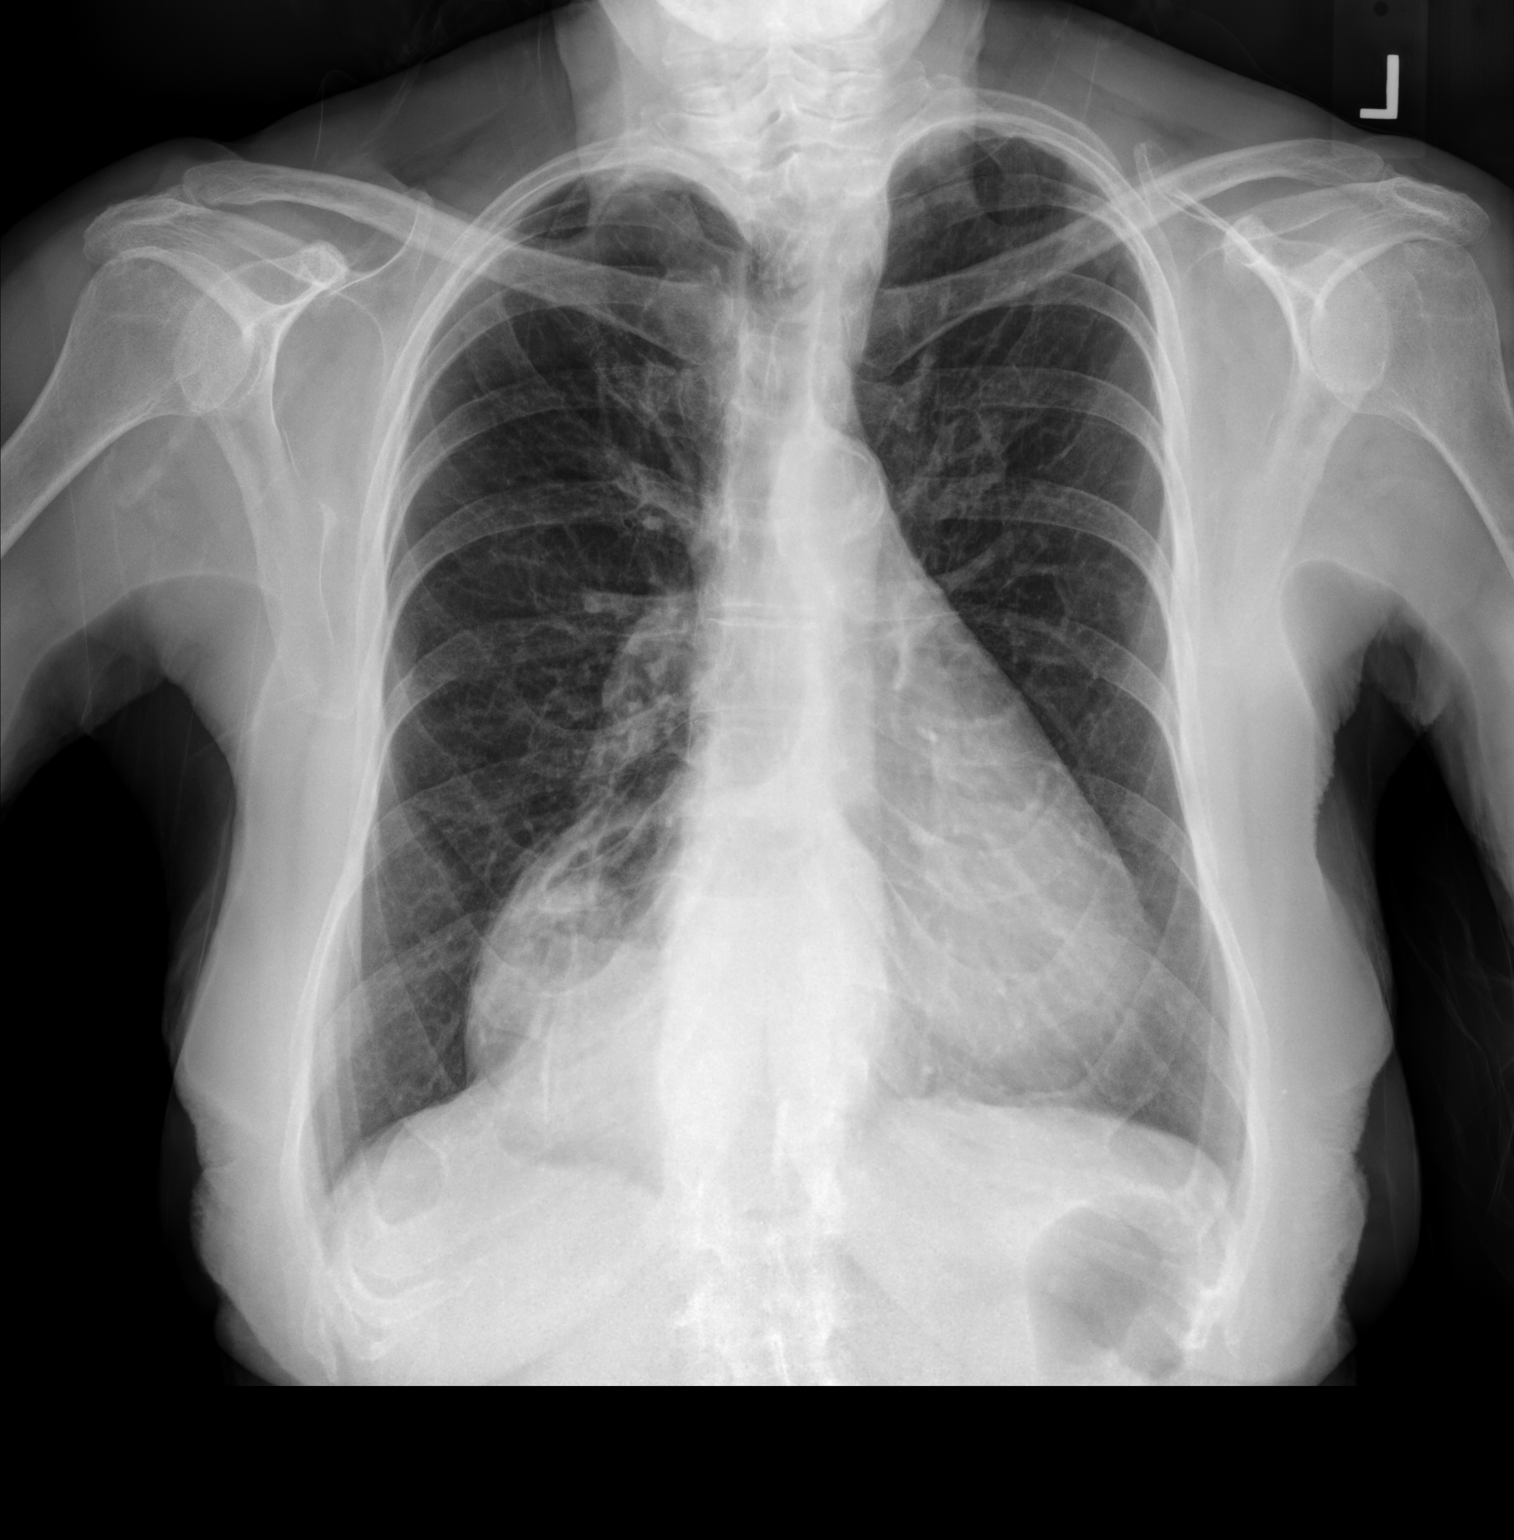

[chest lat]
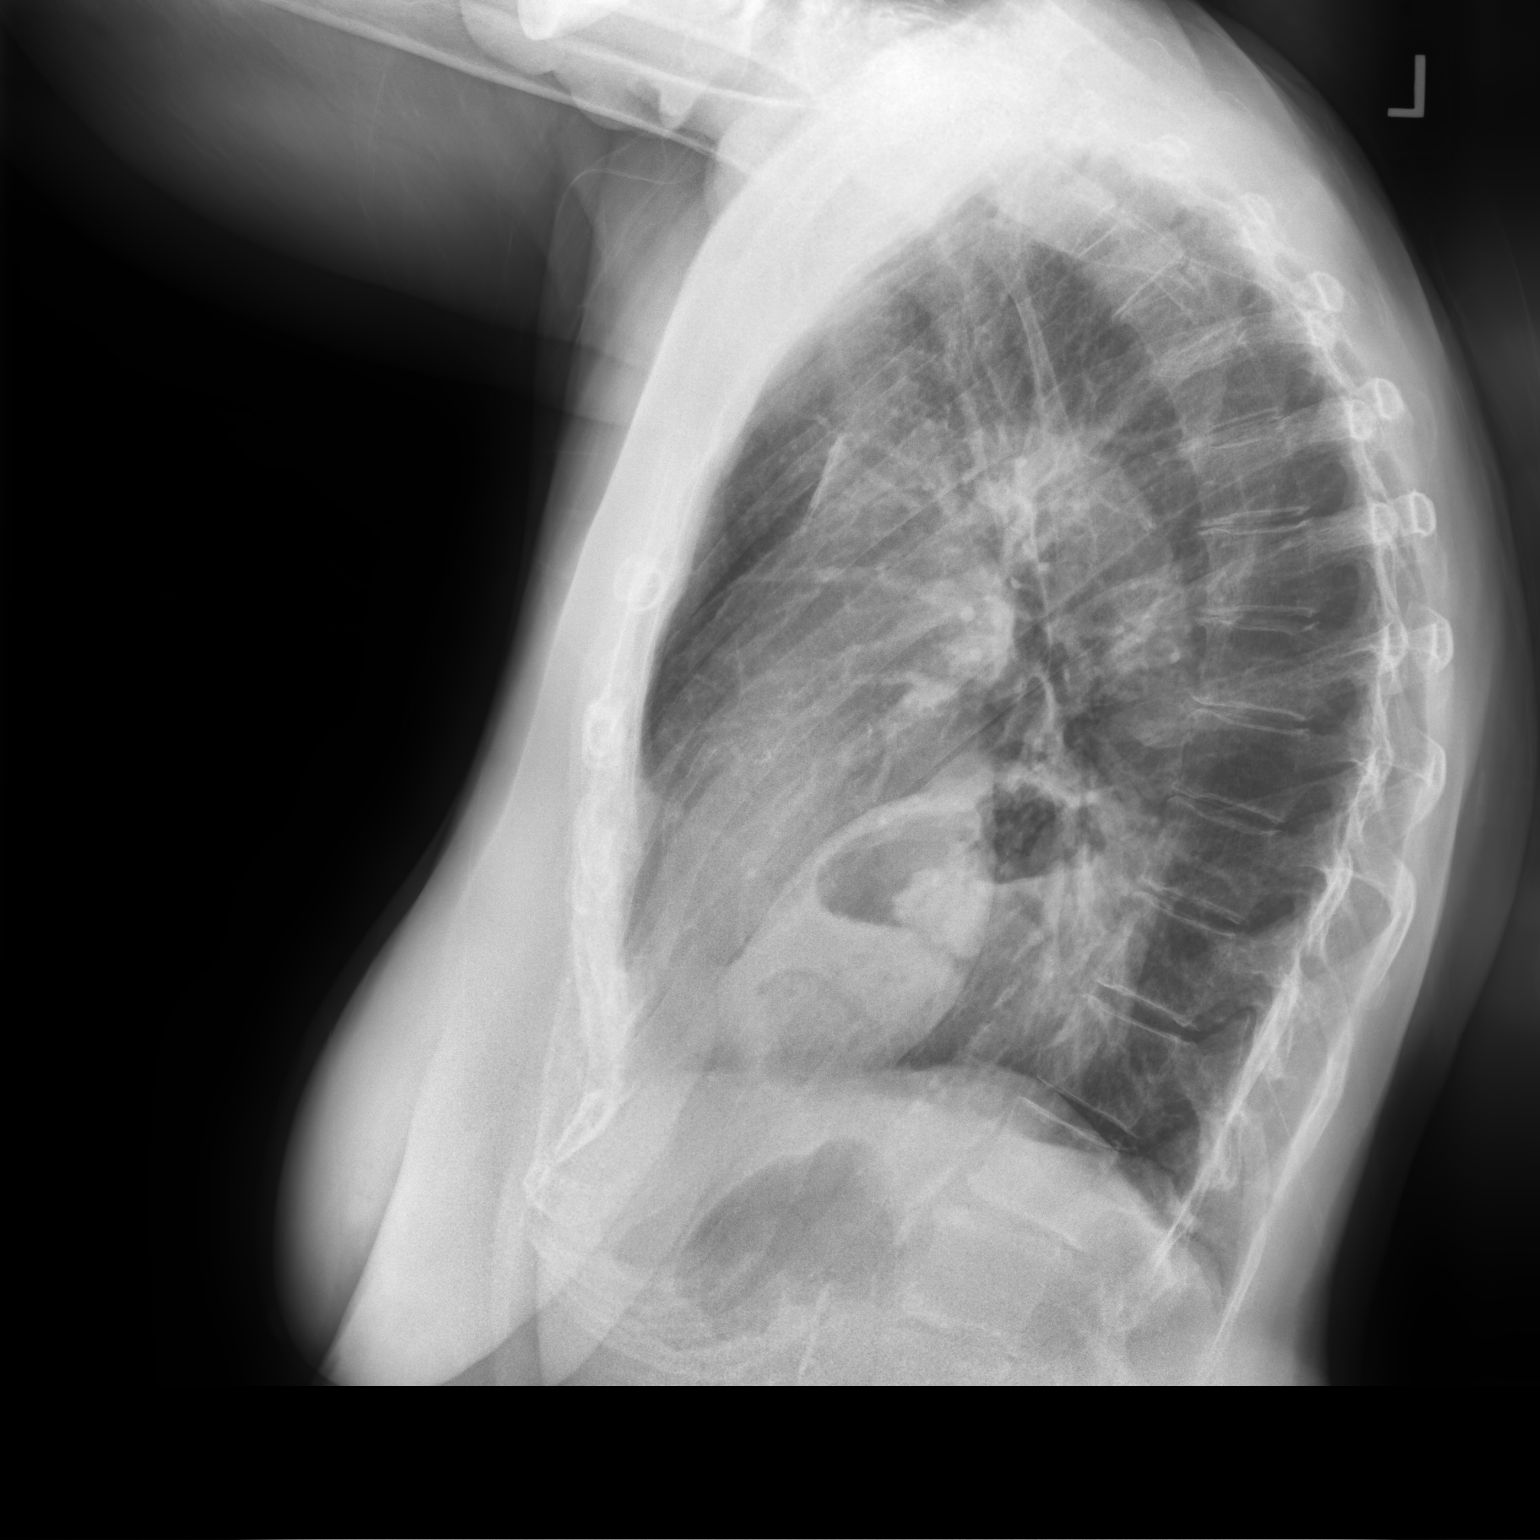

[2 of 2 positions shown; findings below may reference images not displayed]

FINDINGS: Cardiac shadow is stable. Large hiatal hernia eccentric to the right
is noted. Aortic calcifications are again seen. The lungs are well
aerated without focal infiltrate or sizable effusion. No acute bony
abnormality is noted.
IMPRESSION: Large hiatal hernia stable from the prior exam.

No acute abnormality noted.

## 2019-08-18 ENCOUNTER — Other Ambulatory Visit: Payer: Self-pay

## 2019-08-18 ENCOUNTER — Ambulatory Visit
Admission: RE | Admit: 2019-08-18 | Discharge: 2019-08-18 | Disposition: A | Discharge status: Home / Self Care | Payer: Medicare Other | Source: Ambulatory Visit | Attending: Oncology | Admitting: Oncology

## 2019-08-18 DIAGNOSIS — Z853 Personal history of malignant neoplasm of breast: Secondary | ICD-10-CM | POA: Diagnosis not present

## 2019-08-18 DIAGNOSIS — M542 Cervicalgia: Secondary | ICD-10-CM | POA: Diagnosis not present

## 2019-08-18 DIAGNOSIS — M546 Pain in thoracic spine: Secondary | ICD-10-CM | POA: Diagnosis not present

## 2019-08-18 DIAGNOSIS — R922 Inconclusive mammogram: Secondary | ICD-10-CM | POA: Diagnosis not present

## 2019-08-18 DIAGNOSIS — M545 Low back pain: Secondary | ICD-10-CM | POA: Diagnosis not present

## 2019-08-18 DIAGNOSIS — Z9889 Other specified postprocedural states: Secondary | ICD-10-CM

## 2019-08-20 ENCOUNTER — Encounter: Payer: Self-pay | Admitting: Dermatology

## 2019-08-20 ENCOUNTER — Other Ambulatory Visit: Payer: Self-pay

## 2019-08-20 ENCOUNTER — Ambulatory Visit (INDEPENDENT_AMBULATORY_CARE_PROVIDER_SITE_OTHER): Payer: Medicare Other | Admitting: Dermatology

## 2019-08-20 DIAGNOSIS — D034 Melanoma in situ of scalp and neck: Secondary | ICD-10-CM | POA: Diagnosis not present

## 2019-08-20 DIAGNOSIS — I2510 Atherosclerotic heart disease of native coronary artery without angina pectoris: Secondary | ICD-10-CM

## 2019-08-20 DIAGNOSIS — Z85820 Personal history of malignant melanoma of skin: Secondary | ICD-10-CM | POA: Diagnosis not present

## 2019-08-20 DIAGNOSIS — D485 Neoplasm of uncertain behavior of skin: Secondary | ICD-10-CM

## 2019-08-20 DIAGNOSIS — C439 Malignant melanoma of skin, unspecified: Secondary | ICD-10-CM

## 2019-08-20 HISTORY — DX: Malignant melanoma of skin, unspecified: C43.9

## 2019-08-20 NOTE — Progress Notes (Signed)
   Follow-Up Visit   Subjective  Sonia [name] is a 79 y.o. female who presents for the following: Nevus (new black mole on left post neck/upper back x couple months).  New pigmented spot Location: Neck Duration:  Quality:  Associated Signs/Symptoms: Modifying Factors:  Severity:  Timing: Context: History of multiple primary melanomas.  Objective  Well appearing patient in no apparent distress; mood and affect are within normal limits.  A focused examination was performed including Head and neck plus left shoulder.. Relevant physical exam findings are noted in the Assessment and Plan.   Assessment & Plan   Patient Instructions  Biopsy, Surgery (Curettage) & Surgery (Excision) Aftercare Instructions  1. Okay to remove bandage in 24 hours  2. Wash area with soap and water  3. Apply Vaseline to area twice daily until healed (Not Neosporin)  4. Okay to cover with a Band-Aid to decrease the chance of infection or prevent irritation from clothing; also it's okay to uncover lesion at home.  5. Suture instructions: return to our office in 7-10 or 10-14 days for a nurse visit for suture removal. Variable healing with sutures, if pain or itching occurs call our office. It's okay to shower or bathe 24 hours after sutures are given.  6. The following risks may occur after a biopsy, curettage or excision: bleeding, scarring, discoloration, recurrence, infection (redness, yellow drainage, pain or swelling).  7. For questions, concerns and results call our office at [place] before 4pm & Friday before 3pm. Biopsy results will be available in 1 week.     Neoplasm of uncertain behavior of skin Left Posterior Neck  Skin / nail biopsy Type of biopsy: tangential   Informed consent: discussed and consent obtained   Timeout: patient name, date of birth, surgical site, and procedure verified   Procedure prep:  Patient was prepped and draped in usual sterile fashion Prep  type:  Chlorhexidine Anesthesia: the lesion was anesthetized in a standard fashion   Anesthetic:  1% lidocaine w/ epinephrine 1-100,000 local infiltration Instrument used: flexible razor blade   Hemostasis achieved with: ferric subsulfate   Outcome: patient tolerated procedure well   Post-procedure details: wound care instructions given    Specimen 1 - Surgical pathology Differential Diagnosis: r/o atypia Check Margins: No  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     I, Lavonna Monarch, MD, have reviewed all documentation for this visit.  The documentation on 09/19/19 for the exam, diagnosis, procedures, and orders are all accurate and complete.R

## 2019-08-20 NOTE — Patient Instructions (Signed)

## 2019-08-24 DIAGNOSIS — M545 Low back pain: Secondary | ICD-10-CM | POA: Diagnosis not present

## 2019-08-24 DIAGNOSIS — M546 Pain in thoracic spine: Secondary | ICD-10-CM | POA: Diagnosis not present

## 2019-08-24 DIAGNOSIS — M542 Cervicalgia: Secondary | ICD-10-CM | POA: Diagnosis not present

## 2019-08-26 ENCOUNTER — Telehealth: Payer: Self-pay

## 2019-08-26 NOTE — Telephone Encounter (Signed)
Phone call to patient with her pathology results. Voicemail left for patient to give the office a call back.  

## 2019-09-07 NOTE — Telephone Encounter (Signed)
Phone call to patient with her pathology results. Patient aware of results.  

## 2019-09-19 ENCOUNTER — Encounter: Payer: Self-pay | Admitting: Dermatology

## 2019-09-21 DIAGNOSIS — N183 Chronic kidney disease, stage 3 unspecified: Secondary | ICD-10-CM | POA: Diagnosis not present

## 2019-09-21 DIAGNOSIS — M179 Osteoarthritis of knee, unspecified: Secondary | ICD-10-CM | POA: Diagnosis not present

## 2019-09-21 DIAGNOSIS — C50911 Malignant neoplasm of unspecified site of right female breast: Secondary | ICD-10-CM | POA: Diagnosis not present

## 2019-09-21 DIAGNOSIS — I2510 Atherosclerotic heart disease of native coronary artery without angina pectoris: Secondary | ICD-10-CM | POA: Diagnosis not present

## 2019-09-21 DIAGNOSIS — E7800 Pure hypercholesterolemia, unspecified: Secondary | ICD-10-CM | POA: Diagnosis not present

## 2019-09-21 DIAGNOSIS — E038 Other specified hypothyroidism: Secondary | ICD-10-CM | POA: Diagnosis not present

## 2019-09-21 DIAGNOSIS — J449 Chronic obstructive pulmonary disease, unspecified: Secondary | ICD-10-CM | POA: Diagnosis not present

## 2019-09-25 DIAGNOSIS — M25511 Pain in right shoulder: Secondary | ICD-10-CM | POA: Diagnosis not present

## 2019-09-25 DIAGNOSIS — M25512 Pain in left shoulder: Secondary | ICD-10-CM | POA: Diagnosis not present

## 2019-09-28 DIAGNOSIS — M25511 Pain in right shoulder: Secondary | ICD-10-CM | POA: Diagnosis not present

## 2019-09-28 DIAGNOSIS — M7541 Impingement syndrome of right shoulder: Secondary | ICD-10-CM | POA: Diagnosis not present

## 2019-10-06 DIAGNOSIS — M25512 Pain in left shoulder: Secondary | ICD-10-CM | POA: Diagnosis not present

## 2019-10-06 DIAGNOSIS — M25511 Pain in right shoulder: Secondary | ICD-10-CM | POA: Diagnosis not present

## 2019-10-12 DIAGNOSIS — M25511 Pain in right shoulder: Secondary | ICD-10-CM | POA: Diagnosis not present

## 2019-10-12 DIAGNOSIS — M25512 Pain in left shoulder: Secondary | ICD-10-CM | POA: Diagnosis not present

## 2019-10-13 DIAGNOSIS — R7301 Impaired fasting glucose: Secondary | ICD-10-CM | POA: Diagnosis not present

## 2019-10-13 DIAGNOSIS — E785 Hyperlipidemia, unspecified: Secondary | ICD-10-CM | POA: Diagnosis not present

## 2019-10-13 DIAGNOSIS — E162 Hypoglycemia, unspecified: Secondary | ICD-10-CM | POA: Diagnosis not present

## 2019-10-13 DIAGNOSIS — E039 Hypothyroidism, unspecified: Secondary | ICD-10-CM | POA: Diagnosis not present

## 2019-10-13 DIAGNOSIS — E063 Autoimmune thyroiditis: Secondary | ICD-10-CM | POA: Diagnosis not present

## 2019-10-15 ENCOUNTER — Other Ambulatory Visit: Payer: Self-pay

## 2019-10-15 ENCOUNTER — Ambulatory Visit (INDEPENDENT_AMBULATORY_CARE_PROVIDER_SITE_OTHER): Payer: Medicare Other | Admitting: Dermatology

## 2019-10-15 ENCOUNTER — Encounter: Payer: Self-pay | Admitting: Dermatology

## 2019-10-15 DIAGNOSIS — D034 Melanoma in situ of scalp and neck: Secondary | ICD-10-CM

## 2019-10-15 NOTE — Patient Instructions (Signed)

## 2019-10-15 NOTE — Progress Notes (Signed)
Lateral margin stain Vicryl 3-0 x 3 ethilon 3-0 x 5 outer plus one outside vicryl  (3 mattress in the center) nts 10-14 days specimen size 5.5 cm Left posterior neck Daa [id number]

## 2019-10-19 DIAGNOSIS — M25512 Pain in left shoulder: Secondary | ICD-10-CM | POA: Diagnosis not present

## 2019-10-19 DIAGNOSIS — M25511 Pain in right shoulder: Secondary | ICD-10-CM | POA: Diagnosis not present

## 2019-10-20 ENCOUNTER — Telehealth: Payer: Self-pay | Admitting: Internal Medicine

## 2019-10-20 NOTE — Telephone Encounter (Signed)
Pt returning call. [phone].

## 2019-10-20 NOTE — Telephone Encounter (Signed)
Tried calling the pt and there was no answer- LMTCB.  

## 2019-10-20 NOTE — Telephone Encounter (Signed)
Called and spoke with pt. Pt wanted to know if she could be seen for a sooner appt. Stated to her that her scheduled appt with MW on 9/3 is the soonest we could get her with him. Stated to her that we could get her scheduled for a sooner appt with one of our NP's and she declined appt with NP. Stated to pt if needed to be seen sooner than her scheduled appt since she does not want to see a NP, she could either go to ED or UC for sooner appt. Pt verbalized understanding. Nothing further needed.

## 2019-10-21 DIAGNOSIS — M2550 Pain in unspecified joint: Secondary | ICD-10-CM | POA: Diagnosis not present

## 2019-10-21 DIAGNOSIS — M7542 Impingement syndrome of left shoulder: Secondary | ICD-10-CM | POA: Diagnosis not present

## 2019-10-21 DIAGNOSIS — M7541 Impingement syndrome of right shoulder: Secondary | ICD-10-CM | POA: Diagnosis not present

## 2019-10-22 DIAGNOSIS — M25511 Pain in right shoulder: Secondary | ICD-10-CM | POA: Diagnosis not present

## 2019-10-22 DIAGNOSIS — M25512 Pain in left shoulder: Secondary | ICD-10-CM | POA: Diagnosis not present

## 2019-10-27 ENCOUNTER — Ambulatory Visit: Payer: Medicare Other

## 2019-10-29 ENCOUNTER — Ambulatory Visit: Payer: Medicare Other

## 2019-10-29 DIAGNOSIS — M150 Primary generalized (osteo)arthritis: Secondary | ICD-10-CM | POA: Diagnosis not present

## 2019-10-29 DIAGNOSIS — R5383 Other fatigue: Secondary | ICD-10-CM | POA: Diagnosis not present

## 2019-10-29 DIAGNOSIS — E663 Overweight: Secondary | ICD-10-CM | POA: Diagnosis not present

## 2019-10-29 DIAGNOSIS — M2550 Pain in unspecified joint: Secondary | ICD-10-CM | POA: Diagnosis not present

## 2019-10-29 DIAGNOSIS — Z111 Encounter for screening for respiratory tuberculosis: Secondary | ICD-10-CM | POA: Diagnosis not present

## 2019-10-29 DIAGNOSIS — Z6825 Body mass index (BMI) 25.0-25.9, adult: Secondary | ICD-10-CM | POA: Diagnosis not present

## 2019-10-30 ENCOUNTER — Other Ambulatory Visit: Payer: Self-pay

## 2019-10-30 ENCOUNTER — Ambulatory Visit: Payer: Medicare Other | Admitting: *Deleted

## 2019-10-30 NOTE — Progress Notes (Signed)
Pt came in the office today suture removal--no drainage and have a little redness.

## 2019-11-05 ENCOUNTER — Other Ambulatory Visit: Payer: Self-pay | Admitting: Internal Medicine

## 2019-11-05 DIAGNOSIS — J45901 Unspecified asthma with (acute) exacerbation: Secondary | ICD-10-CM

## 2019-11-13 ENCOUNTER — Telehealth: Payer: Self-pay | Admitting: Internal Medicine

## 2019-11-13 ENCOUNTER — Ambulatory Visit: Payer: Medicare Other | Admitting: Internal Medicine

## 2019-11-13 MED ORDER — ALBUTEROL SULFATE HFA 108 (90 BASE) MCG/ACT IN AERS: 2.0000 | INHALATION_SPRAY | Freq: Four times a day (QID) | RESPIRATORY_TRACT | 0 refills | Status: DC | PRN

## 2019-11-13 NOTE — Telephone Encounter (Signed)
ATC pt to let her know Proair refill has been sent to pharmacy. Unable to leave message.

## 2019-11-27 DIAGNOSIS — M150 Primary generalized (osteo)arthritis: Secondary | ICD-10-CM | POA: Diagnosis not present

## 2019-11-27 DIAGNOSIS — E663 Overweight: Secondary | ICD-10-CM | POA: Diagnosis not present

## 2019-11-27 DIAGNOSIS — Z6825 Body mass index (BMI) 25.0-25.9, adult: Secondary | ICD-10-CM | POA: Diagnosis not present

## 2019-11-27 DIAGNOSIS — M2550 Pain in unspecified joint: Secondary | ICD-10-CM | POA: Diagnosis not present

## 2019-11-27 DIAGNOSIS — M1120 Other chondrocalcinosis, unspecified site: Secondary | ICD-10-CM | POA: Diagnosis not present

## 2019-11-27 DIAGNOSIS — M353 Polymyalgia rheumatica: Secondary | ICD-10-CM | POA: Diagnosis not present

## 2019-11-29 ENCOUNTER — Encounter: Payer: Self-pay | Admitting: Dermatology

## 2019-11-29 NOTE — Progress Notes (Signed)
   Follow-Up Visit   Subjective  Sonia [name] is a 79 y.o. female who presents for the following: Procedure (left posterior neck).  Melanoma in situ Location: Left posterior neck Duration:  Quality:  Associated Signs/Symptoms: Modifying Factors:  Severity:  Timing: Context: For excision  Objective  Well appearing patient in no apparent distress; mood and affect are within normal limits.  A focused examination was performed including Head and neck.. Relevant physical exam findings are noted in the Assessment and Plan.   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  Skin excision  Lesion length (cm):  0.5 Lesion width (cm):  0.5 Margin per side (cm):  0.8 Total excision diameter (cm):  2.1 Informed consent: discussed and consent obtained   Timeout: patient name, date of birth, surgical site, and procedure verified   Procedure prep:  Patient was prepped and draped in usual sterile fashion Prep type:  Chlorhexidine Anesthesia: the lesion was anesthetized in a standard fashion   Anesthetic:  1% lidocaine w/ epinephrine 1-100,000 local infiltration Instrument used: #15 blade   Hemostasis achieved with: suture   Outcome: patient tolerated procedure well with no complications   Post-procedure details: sterile dressing applied and wound care instructions given   Dressing type: petrolatum    Skin repair Complexity:  Intermediate Final length (cm):  5.5 Informed consent: discussed and consent obtained   Reason for type of repair: reduce tension to allow closure, reduce the risk of dehiscence, infection, and necrosis, preserve normal anatomy and enhance both functionality and cosmetic results   Subcutaneous layers (deep stitches):  Suture size:  3-0 Suture type: Vicryl (polyglactin 910)   Fine/surface layer approximation (top stitches):  Suture size:  3-0 Hemostasis achieved with: suture and pressure Post-procedure details: sterile  dressing applied and wound care instructions given   Dressing type: bandage and petrolatum    Other Related Procedures Surgical pathology     I, Lavonna Monarch, MD, have reviewed all documentation for this visit.  The documentation on 11/29/19 for the exam, diagnosis, procedures, and orders are all accurate and complete.

## 2019-12-16 DIAGNOSIS — M7541 Impingement syndrome of right shoulder: Secondary | ICD-10-CM | POA: Diagnosis not present

## 2019-12-16 DIAGNOSIS — M7542 Impingement syndrome of left shoulder: Secondary | ICD-10-CM | POA: Diagnosis not present

## 2019-12-18 ENCOUNTER — Other Ambulatory Visit: Payer: Self-pay | Admitting: Internal Medicine

## 2019-12-18 ENCOUNTER — Other Ambulatory Visit: Payer: Self-pay | Admitting: Pulmonary Disease

## 2019-12-25 ENCOUNTER — Ambulatory Visit: Payer: Medicare Other | Admitting: Internal Medicine

## 2019-12-28 DIAGNOSIS — M150 Primary generalized (osteo)arthritis: Secondary | ICD-10-CM | POA: Diagnosis not present

## 2019-12-28 DIAGNOSIS — M1120 Other chondrocalcinosis, unspecified site: Secondary | ICD-10-CM | POA: Diagnosis not present

## 2019-12-28 DIAGNOSIS — M2550 Pain in unspecified joint: Secondary | ICD-10-CM | POA: Diagnosis not present

## 2019-12-28 DIAGNOSIS — E663 Overweight: Secondary | ICD-10-CM | POA: Diagnosis not present

## 2019-12-28 DIAGNOSIS — Z6826 Body mass index (BMI) 26.0-26.9, adult: Secondary | ICD-10-CM | POA: Diagnosis not present

## 2019-12-28 DIAGNOSIS — M353 Polymyalgia rheumatica: Secondary | ICD-10-CM | POA: Diagnosis not present

## 2020-01-04 ENCOUNTER — Other Ambulatory Visit: Payer: Self-pay | Admitting: Pulmonary Disease

## 2020-01-05 ENCOUNTER — Ambulatory Visit (INDEPENDENT_AMBULATORY_CARE_PROVIDER_SITE_OTHER): Payer: Medicare Other | Admitting: Internal Medicine

## 2020-01-05 ENCOUNTER — Ambulatory Visit (INDEPENDENT_AMBULATORY_CARE_PROVIDER_SITE_OTHER): Payer: Medicare Other

## 2020-01-05 ENCOUNTER — Encounter: Payer: Self-pay | Admitting: Internal Medicine

## 2020-01-05 ENCOUNTER — Other Ambulatory Visit: Payer: Self-pay

## 2020-01-05 DIAGNOSIS — J45909 Unspecified asthma, uncomplicated: Secondary | ICD-10-CM | POA: Diagnosis not present

## 2020-01-05 DIAGNOSIS — I2510 Atherosclerotic heart disease of native coronary artery without angina pectoris: Secondary | ICD-10-CM | POA: Diagnosis not present

## 2020-01-05 DIAGNOSIS — J4540 Moderate persistent asthma, uncomplicated: Secondary | ICD-10-CM

## 2020-01-05 DIAGNOSIS — R7301 Impaired fasting glucose: Secondary | ICD-10-CM | POA: Diagnosis not present

## 2020-01-05 DIAGNOSIS — E162 Hypoglycemia, unspecified: Secondary | ICD-10-CM | POA: Diagnosis not present

## 2020-01-05 DIAGNOSIS — E785 Hyperlipidemia, unspecified: Secondary | ICD-10-CM | POA: Diagnosis not present

## 2020-01-05 DIAGNOSIS — K449 Diaphragmatic hernia without obstruction or gangrene: Secondary | ICD-10-CM | POA: Diagnosis not present

## 2020-01-05 DIAGNOSIS — E039 Hypothyroidism, unspecified: Secondary | ICD-10-CM | POA: Diagnosis not present

## 2020-01-05 DIAGNOSIS — E063 Autoimmune thyroiditis: Secondary | ICD-10-CM | POA: Diagnosis not present

## 2020-01-05 MED ORDER — BREZTRI AEROSPHERE 160-9-4.8 MCG/ACT IN AERO
2.0000 | INHALATION_SPRAY | Freq: Two times a day (BID) | RESPIRATORY_TRACT | 11 refills | Status: DC
Start: 2020-01-05 — End: 2020-03-28

## 2020-01-05 MED ORDER — ALBUTEROL SULFATE (2.5 MG/3ML) 0.083% IN NEBU
2.5000 mg | INHALATION_SOLUTION | Freq: Four times a day (QID) | RESPIRATORY_TRACT | 11 refills | Status: DC | PRN
Start: 2020-01-05 — End: 2021-05-29

## 2020-01-05 NOTE — Patient Instructions (Addendum)
Plan A = Automatic = Always=    Breztri Take 2 puffs first thing in am and then another 2 puffs about 12 hours later x 2 weeks trial > if not better resume the symbicort.  Work on inhaler technique:  relax and gently blow all the way out then take a nice smooth deep breath back in, triggering the inhaler at same time you start breathing in.  Hold for up to 5 seconds if you can. Blow out thru nose. Rinse and gargle with water when done     Plan B = Backup (to supplement plan A, not to replace it) Only use your albuterol inhaler as a rescue medication to be used if you can't catch your breath by resting or doing a relaxed purse lip breathing pattern.  - The less you use it, the better it will work when you need it. - Ok to use the inhaler up to 2 puffs  every 4 hours if you must but call for appointment if use goes up over your usual need - Don't leave home without it !!  (think of it like the spare tire for your car)    Plan C = Crisis (instead of Plan B but only if Plan B stops working) - only use your albuterol nebulizer if you first try Plan B and it fails to help > ok to use the nebulizer up to every 4 hours but if start needing it regularly call for immediate appointment   Please remember to go to the  x-ray department  for your tests - we will call you with the results when they are available     Please schedule a follow up visit in 3 months but call sooner if needed

## 2020-01-05 NOTE — Progress Notes (Signed)
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 Asthma flare Chief Complaint  Patient presents with  . Acute Visit    Pt c/o cough, wheezing and increased SOB x 2 wks. She is coughing up clear sputum.   prev w/u showed bad reflux  maint at baseline symb 160 2 bid and no proair  02/24/16 called in 7 days of worse cough/ wheeze/sob> rx medrol not clear she took it Exp to gg child sick prior to onset of cough rec Take 4 for three days 3 for three days 2 for three days 1 for three days and stop  Start Pantoprazole (protonix) 40 mg   Take  30-60 min before first meal of the day and Pepcid (famotidine)  20 mg one @  bedtime until return to office - this is the best way to tell whether stomach acid is contributing to your problem.      07/31/2018 acute extended ov/Sonia [name] re:  Worse sob since   Nov 2018  Now on omeprazole 20mg  60 min ac  Early march 2020 worse sob despite symbicort 160 2 bid   No cough Sleeping on wedge  Room to room at home  rec Try omeprazole 20 mg x 2 x 30-60 min before your first meal Symbicort 160 Take 2 puffs first thing in am and then another 2 puffs about 12 hours later.  Only use your albuterol as a rescue medication  Please schedule a follow up visit in 3 months but call sooner if needed  with all medications /inhalers/ solutions in hand so we can verify exactly what you are taking. This includes all medications from all doctors and over the counters   11/05/2018  f/u ov/Sonia [name] re: chronic asthma on ppi 20 mg qd and symb 160 2bid / no meds in hand as  req Chief Complaint  Patient presents with  . Follow-up    Pt states her breathing has been some worse with the humid weather. She has used her albuterol inhaler 2 x since the last visit.   Dyspnea:  Aerobics/ swimming now again but doesn't do well out in heat/ humidity  Cough: no  Sleeping: no resp issues   SABA use: min hfa/ no neb 02: no rec Work on inhaler technique:   Please schedule a follow up visit in 6 months but call sooner if needed  with all medications /inhalers/ solutions in hand so we can verify exactly what you are taking. This includes all medications from all doctors and over the counters     02/03/2019  f/u ov/Sonia [name] re: chronic asthma, worse since September 2020 with overt gerd  Chief Complaint  Patient presents with  . Follow-up    Breathing is okay today. She is using her albuterol inhaler 1-2 x per wk on average.   Dyspnea:  Fine sitting still but doe across the room on symb 160 2bid and did not prev respond to singulair  Cough: worse bending over / non productive, on prilosec 20 mg ac qd  With overt HB Sleeping:  no resp symptoms  SABA use: 1-2x weekly when overdoes it, never pre-challenges / has neb but no meds for it x years. 02: none  rec  GERD   Stop symbicort and start Breztri Take 2 puffs first thing in am and then another 2 puffs about 12 hours later. 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 Follow-up    3 month f/u. States breathing has been stable since last visit.   Dyspnea:   MMRC2 = can't walk a nl pace on a flat grade s sob but does fine slow and flat  eg [employer] Cough: none Sleeping: no resp symptoms  SABA use: same need as previous 02: none  Overt hb/ buccini following and says "concerned about bones so doesn't want to use PPI" rec No change   Dx April 2021  PMR  and placed on placed on prednisone   01/05/2020  f/u ov/Sonia [name] re: ? ACOS  symbicort off ppi only taking tums now on symb 160 and pred 15 mg daily  Chief Complaint  Patient presents with  . 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    Dyspnea:  Worse off breztri ?  / on pred for PMR   Cough :  None  Sleeping: 2 pillows / bed is flat  SABA use: not sure it helps /never prechallenges as rec  02: none    No obvious day to day or daytime variability or assoc excess/ purulent sputum or mucus plugs or hemoptysis or cp or chest tightness, subjective wheeze or overt sinus or hb symptoms.   Sleeping  without nocturnal  or early am exacerbation  of respiratory  c/o's or need for noct saba. Also denies any obvious fluctuation of symptoms with weather or environmental changes or other aggravating or alleviating factors except as outlined above   No unusual exposure hx or h/o childhood pna/ asthma or knowledge of premature birth.  Current Allergies, Complete Past Medical History, Past Surgical History, Family History, and Social History were reviewed in [employer] record.  ROS  The following are not active complaints unless bolded Hoarseness, sore throat, dysphagia, dental problems, itching, sneezing,  nasal congestion or discharge of excess mucus or purulent secretions, ear ache,   fever, chills, sweats, unintended wt loss or wt gain, classically pleuritic or exertional cp,  orthopnea pnd or arm/hand swelling  or leg swelling, presyncope, palpitations, abdominal pain, anorexia, nausea, vomiting, diarrhea  or change in bowel habits or change in bladder habits, change in stools or change in urine, dysuria, hematuria,  rash, arthralgias, visual complaints, headache, numbness, weakness or ataxia or problems with walking or coordination,  change in mood or  memory.        Current Meds  Medication Sig  . albuterol (PROVENTIL) (2.5 MG/3ML)  0.083% nebulizer solution USE ONE VIAL IN NEBULIZER EVERY 6 HOURS AS NEEDED FOR WHEEZING OR  SHORTNESS  OF  BREATH  . albuterol (VENTOLIN HFA) 108 (90 Base) MCG/ACT inhaler INHALE 2 PUFFS BY MOUTH EVERY 6 HOURS AS NEEDED FOR WHEEZING FOR SHORTNESS OF BREATH  . aspirin EC 81 MG tablet Take 1 tablet (81 mg total) by mouth daily.  . calcium carbonate (TUMS - DOSED IN MG ELEMENTAL CALCIUM) 500 MG  chewable tablet Chew 500 mg by mouth daily as needed for indigestion or heartburn.  . Cholecalciferol 25 MCG (1000 UT) capsule Take 10,000 Units by mouth 3 (three) times a week.  . cromolyn (NASALCROM) 5.2 MG/ACT nasal spray Place 2 sprays into both nostrils daily as needed for allergies.  . Cyanocobalamin (B-12) 5000 MCG CAPS Take 5,000 mcg by mouth every Monday, Wednesday, and Friday.  . levothyroxine (SYNTHROID) 112 MCG tablet Synthroid 112 mcg tablet  TAKE 1 TABLET BY MOUTH ONCE DAILY IN THE MORNING ON AN EMPTY STOMACH  . nitroGLYCERIN (NITROSTAT) 0.4 MG SL tablet Place 0.4 mg under the tongue every 5 (five) minutes as needed for chest pain.  . predniSONE (DELTASONE) 10 MG tablet Take 10 mg by mouth daily with breakfast.  . [DISCONTINUED] SYNTHROID 112 MCG tablet Take 112 mcg by mouth daily.                    Physical Exam:   01/05/2020     156  05/12/2019         152  02/03/2019     153 11/05/2018       152  07/31/2018       154   03/07/16 160 lb 12.8 oz (72.9 kg)  01/04/16 160 lb (72.6 kg)  04/26/15 159 lb 4.8 oz (72.3 kg)    amb wf evasive with questions related to symptoms  HEENT : pt wearing mask not removed for exam due to covid - 19 concerns.   NECK :  without JVD/Nodes/TM/ nl carotid upstrokes bilaterally   LUNGS: no acc muscle use,  Min barrel  contour chest wall with bilateral  distnat exp wheeze improve with purse lip breathing and  without cough on insp or exp maneuvers and min  Hyperresonant  to  percussion bilaterally     CV:  RRR  no s3 or murmur or increase in P2,  and no edema   ABD:  soft and nontender with pos end  insp Hoover's  in the supine position. No bruits or organomegaly appreciated, bowel sounds nl  MS:   Nl gait/  ext warm without deformities, calf tenderness, cyanosis or clubbing No obvious joint restrictions   SKIN: warm and dry without lesions    NEURO:  alert, approp, nl sensorium with  no motor or cerebellar deficits apparent.           CXR PA and Lateral:   01/05/2020 :    I personally reviewed images and   impression as follows:  Mild/mod  copd pattern with huge HH

## 2020-01-06 ENCOUNTER — Encounter: Payer: Self-pay | Admitting: Internal Medicine

## 2020-01-06 NOTE — Assessment & Plan Note (Addendum)
Informed at [facility] severe gerd likely the cause with large [place] on cxr but "turned surgery down" PFT's  07/30/11   FEV1  1.25 (61 % ) ratio 0.47  p 6 % improvement from saba p ? prior to study with DLCO  94 % and  corrects to 141  % for alv volume c classic f/v loop for  chronic asthma    - FENO 03/07/2016  =   9 on symb 160 2bid during acute flare - 07/31/2018     75% > continue symb 160 2bid  - 11/05/2018  After extensive coaching inhaler device,  effectiveness =    75% (short ti)  - 11/05/18  Alpha one AT :   MM   Level 179  - 02/03/2019  After extensive coaching inhaler device,  effectiveness =    75% (short Ti) > rec trial of breztri 2bid > some better 05/12/2019 so continue - 01/05/2020  After extensive coaching inhaler device,  effectiveness =    75% > rechallenge with breztri   Convinced breathing worse due to "allergies"  despite symb 160 2bid and pred 15 m daily for pmr and thinks breztri helped in past but not sure  rec rechallenge with breztri x 2 weeks and approp use of saba as prn   And  Advised "ok to try albuterol 15 min before exertion on alternate days to see if it makes any difference and if makes none then no need to  take it after activity unless you can't catch your breath."      Advised: Advised:  formulary restrictions will be an ongoing challenge for the forseable future and I would be happy to pick an alternative if the pt will first  provide me a list of them -  pt  will need to return here for training for any new device that is required eg dpi vs hfa vs respimat.    In the meantime we can always provide samples so that the patient never runs out of any needed respiratory medications.    If not better with breztri it could be that what [place] told her is still true as she she has changed her GERD rx to just tums she says on Dr [employer] recs but   always difficult to exclude as up to 75% of pts in some series report no assoc GI/ Heartburn symptoms> will refer  back to [facility] if not better on breztri         Each maintenance medication was reviewed in detail including emphasizing most importantly the difference between maintenance and prns and under what circumstances the prns are to be triggered using an action plan format where appropriate.  Total time for H and P, chart review, counseling, teaching device and generating customized AVS unique to this office visit / charting = 26 min

## 2020-01-08 ENCOUNTER — Encounter: Payer: Self-pay | Admitting: Internal Medicine

## 2020-01-08 NOTE — Progress Notes (Signed)
Letter mailed to the pt. 

## 2020-02-18 DIAGNOSIS — M353 Polymyalgia rheumatica: Secondary | ICD-10-CM | POA: Diagnosis not present

## 2020-02-18 DIAGNOSIS — Z789 Other specified health status: Secondary | ICD-10-CM | POA: Diagnosis not present

## 2020-02-18 DIAGNOSIS — E162 Hypoglycemia, unspecified: Secondary | ICD-10-CM | POA: Diagnosis not present

## 2020-02-18 DIAGNOSIS — E039 Hypothyroidism, unspecified: Secondary | ICD-10-CM | POA: Diagnosis not present

## 2020-02-18 DIAGNOSIS — R7301 Impaired fasting glucose: Secondary | ICD-10-CM | POA: Diagnosis not present

## 2020-02-18 DIAGNOSIS — E785 Hyperlipidemia, unspecified: Secondary | ICD-10-CM | POA: Diagnosis not present

## 2020-02-18 DIAGNOSIS — E063 Autoimmune thyroiditis: Secondary | ICD-10-CM | POA: Diagnosis not present

## 2020-02-23 DIAGNOSIS — C50411 Malignant neoplasm of upper-outer quadrant of right female breast: Secondary | ICD-10-CM | POA: Diagnosis not present

## 2020-03-02 DIAGNOSIS — M179 Osteoarthritis of knee, unspecified: Secondary | ICD-10-CM | POA: Diagnosis not present

## 2020-03-02 DIAGNOSIS — I2510 Atherosclerotic heart disease of native coronary artery without angina pectoris: Secondary | ICD-10-CM | POA: Diagnosis not present

## 2020-03-02 DIAGNOSIS — J449 Chronic obstructive pulmonary disease, unspecified: Secondary | ICD-10-CM | POA: Diagnosis not present

## 2020-03-02 DIAGNOSIS — E7800 Pure hypercholesterolemia, unspecified: Secondary | ICD-10-CM | POA: Diagnosis not present

## 2020-03-02 DIAGNOSIS — E038 Other specified hypothyroidism: Secondary | ICD-10-CM | POA: Diagnosis not present

## 2020-03-02 DIAGNOSIS — C50911 Malignant neoplasm of unspecified site of right female breast: Secondary | ICD-10-CM | POA: Diagnosis not present

## 2020-03-02 DIAGNOSIS — K219 Gastro-esophageal reflux disease without esophagitis: Secondary | ICD-10-CM | POA: Diagnosis not present

## 2020-03-02 DIAGNOSIS — N183 Chronic kidney disease, stage 3 unspecified: Secondary | ICD-10-CM | POA: Diagnosis not present

## 2020-03-03 DIAGNOSIS — R8290 Unspecified abnormal findings in urine: Secondary | ICD-10-CM | POA: Diagnosis not present

## 2020-03-03 DIAGNOSIS — N3000 Acute cystitis without hematuria: Secondary | ICD-10-CM | POA: Diagnosis not present

## 2020-03-03 DIAGNOSIS — N390 Urinary tract infection, site not specified: Secondary | ICD-10-CM | POA: Diagnosis not present

## 2020-03-16 DIAGNOSIS — N390 Urinary tract infection, site not specified: Secondary | ICD-10-CM | POA: Diagnosis not present

## 2020-03-25 DIAGNOSIS — R351 Nocturia: Secondary | ICD-10-CM | POA: Diagnosis not present

## 2020-03-25 DIAGNOSIS — N3946 Mixed incontinence: Secondary | ICD-10-CM | POA: Diagnosis not present

## 2020-03-25 DIAGNOSIS — N8111 Cystocele, midline: Secondary | ICD-10-CM | POA: Diagnosis not present

## 2020-03-25 DIAGNOSIS — R350 Frequency of micturition: Secondary | ICD-10-CM | POA: Diagnosis not present

## 2020-03-28 ENCOUNTER — Other Ambulatory Visit: Payer: Self-pay | Admitting: Internal Medicine

## 2020-04-17 DIAGNOSIS — R300 Dysuria: Secondary | ICD-10-CM | POA: Diagnosis not present

## 2020-04-17 DIAGNOSIS — N3000 Acute cystitis without hematuria: Secondary | ICD-10-CM | POA: Diagnosis not present

## 2020-05-24 ENCOUNTER — Encounter: Payer: Self-pay | Admitting: Internal Medicine

## 2020-05-24 ENCOUNTER — Ambulatory Visit (INDEPENDENT_AMBULATORY_CARE_PROVIDER_SITE_OTHER): Payer: Medicare Other | Admitting: Internal Medicine

## 2020-05-24 ENCOUNTER — Other Ambulatory Visit: Payer: Self-pay

## 2020-05-24 DIAGNOSIS — J4540 Moderate persistent asthma, uncomplicated: Secondary | ICD-10-CM

## 2020-05-24 MED ORDER — BUDESONIDE-FORMOTEROL FUMARATE 160-4.5 MCG/ACT IN AERO
INHALATION_SPRAY | RESPIRATORY_TRACT | 11 refills | Status: DC
Start: 2020-05-24 — End: 2020-10-11

## 2020-05-24 MED ORDER — ALBUTEROL SULFATE (2.5 MG/3ML) 0.083% IN NEBU
2.5000 mg | INHALATION_SOLUTION | RESPIRATORY_TRACT | 12 refills | Status: DC | PRN
Start: 2020-05-24 — End: 2020-10-11

## 2020-05-24 NOTE — Progress Notes (Signed)
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 Asthma flare Chief Complaint  Patient presents with  . Acute Visit    Pt c/o cough, wheezing and increased SOB x 2 wks. She is coughing up clear sputum.   prev w/u showed bad reflux  maint at baseline symb 160 2 bid and no proair  02/24/16 called in 7 days of worse cough/ wheeze/sob> rx medrol not clear she took it Exp to gg child sick prior to onset of cough rec Take 4 for three days 3 for three days 2 for three days 1 for three days and stop  Start Pantoprazole (protonix) 40 mg   Take  30-60 min before first meal of the day and Pepcid (famotidine)  20 mg one @  bedtime until return to office - this is the best way to tell whether stomach acid is contributing to your problem.      07/31/2018 acute extended ov/Sonia [name] re:  Worse sob since   Nov 2018  Now on omeprazole 20mg  60 min ac  Early march 2020 worse sob despite symbicort 160 2 bid   No cough Sleeping on wedge  Room to room at home  rec Try omeprazole 20 mg x 2 x 30-60 min before your first meal Symbicort 160 Take 2 puffs first thing in am and then another 2 puffs about 12 hours later.  Only use your albuterol as a rescue medication  Please schedule a follow up visit in 3 months but call sooner if needed  with all medications /inhalers/ solutions in hand so we can verify exactly what you are taking. This includes all medications from all doctors and over the counters   11/05/2018  f/u ov/Sonia [name]  re: chronic asthma on ppi 20 mg qd and symb 160 2bid / no meds in hand as req Chief Complaint  Patient presents with  . Follow-up    Pt states her breathing has been some worse with the humid weather. She has used her albuterol inhaler 2 x since the last visit.   Dyspnea:  Aerobics/ swimming now again but doesn't do well out in heat/ humidity  Cough: no  Sleeping: no resp issues   SABA use: min hfa/ no neb 02: no rec Work on inhaler technique:   Please schedule a follow up visit in 6 months but call sooner if needed  with all medications /inhalers/ solutions in hand so we can verify exactly what you are taking. This includes all medications from all doctors and over the counters     02/03/2019  f/u ov/Sonia [name] re: chronic asthma, worse since September 2020 with overt gerd  Chief Complaint  Patient presents with  . Follow-up    Breathing is okay today. She is using her albuterol inhaler 1-2 x per wk on average.   Dyspnea:  Fine sitting still but doe across the room on symb 160 2bid and did not prev respond to singulair  Cough: worse bending over /  non productive, on prilosec 20 mg ac qd  With overt HB Sleeping: no resp symptoms  SABA use: 1-2x weekly when overdoes it, never pre-challenges / has neb but no meds for it x years. 02: none  rec  GERD   Stop symbicort and start Breztri Take 2 puffs first thing in am and then another 2 puffs about 12 hours later. 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 Follow-up    3 month f/u. States breathing has been stable since last visit.   Dyspnea:   MMRC2 = can't walk a nl pace on a flat grade s sob but does fine slow and flat   [employer] Cough: none Sleeping: no resp symptoms  SABA use: same need as previous 02: none  Overt hb/ buccini following and says "concerned about  bones so doesn't want to use PPI" rec No change   Dx April 2021  PMR and placed on placed on prednisone   01/05/2020  f/u ov/Sonia [name] re: ? ACOS  symbicort off ppi only taking tums now on symb 160 and pred 15 mg daily  Chief Complaint  Patient presents with  . 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    Dyspnea:  Worse off breztri ?  / on pred for PMR   Cough :  None  Sleeping: 2 pillows / bed is flat  SABA use: not sure it helps /never prechallenges as rec  02: none  rec Plan A = Automatic = Always=    Breztri Take 2 puffs first thing in am and then another 2 puffs about 12 hours later x 2 weeks trial > if not better resume the symbicort. 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 Follow-up    Increased SOB "since the allergies are out". She rarely uses her albuterol inhaler or neb.  Dyspnea:  Not sure ? HT a little easier but new bladder symptoms on Breztri  Cough: none, does not blow breztri out thru nose Sleeping: 2 pillow/ bed flat  SABA use: rarely use  02: not Covid status:   Not vaccinated    No obvious day to day or daytime variability or assoc excess/ purulent sputum or mucus plugs or hemoptysis or cp or chest tightness, subjective wheeze or overt sinus or hb symptoms.   Sleeping  without nocturnal  or early am exacerbation  of respiratory  c/o's or need for noct saba. Also denies any obvious fluctuation of symptoms with weather or environmental changes or other aggravating or alleviating factors except as outlined above   No unusual exposure hx or h/o childhood pna/ asthma or knowledge of  premature birth.  Current Allergies, Complete Past Medical History, Past Surgical History, Family History, and Social History were reviewed in [employer] record.  ROS  The following are not active complaints unless bolded Hoarseness, sore throat,  dysphagia, dental problems, itching, sneezing,  nasal congestion or discharge of excess mucus or purulent secretions, ear ache,   fever, chills, sweats, unintended wt loss or wt gain, classically pleuritic or exertional cp,  orthopnea pnd or arm/hand swelling  or leg swelling, presyncope, palpitations, abdominal pain, anorexia, nausea, vomiting, diarrhea  or change in bowel habits or change in bladder habits, change in stools or change in urine, dysuria, hematuria,  rash, arthralgias, visual complaints, headache, numbness, weakness or ataxia or problems with walking or coordination,  change in mood or  memory.        Current Meds  Medication Sig  . albuterol (PROVENTIL) (2.5 MG/3ML) 0.083% nebulizer solution Take 3 mLs (2.5 mg total) by nebulization every 6 (six) hours as needed for wheezing or shortness of breath.  Marland Kitchen albuterol (VENTOLIN HFA) 108 (90 Base) MCG/ACT inhaler INHALE 2 PUFFS BY MOUTH EVERY 6 HOURS AS NEEDED FOR WHEEZING FOR SHORTNESS OF BREATH  . aspirin EC 81 MG tablet Take 1 tablet (81 mg total) by mouth daily.  Marland Kitchen BREZTRI AEROSPHERE 160-9-4.8 MCG/ACT AERO INHALE 2 PUFFS INTO THE LUNGS TWICE DAILY  . calcium carbonate (TUMS - DOSED IN MG ELEMENTAL CALCIUM) 500 MG chewable tablet Chew 500 mg by mouth daily as needed for indigestion or heartburn.  . cetirizine (ZYRTEC) 10 MG tablet Take 10 mg by mouth daily.  . Cholecalciferol 25 MCG (1000 UT) capsule Take 10,000 Units by mouth 3 (three) times a week.  . cromolyn (NASALCROM) 5.2 MG/ACT nasal spray Place 2 sprays into both nostrils daily as needed for allergies.  . Cyanocobalamin (B-12) 5000 MCG CAPS Take 5,000 mcg by mouth every Monday, Wednesday, and Friday.  .  levothyroxine (SYNTHROID) 112 MCG tablet Synthroid 112 mcg tablet  TAKE 1 TABLET BY MOUTH ONCE DAILY IN THE MORNING ON AN EMPTY STOMACH  . nitroGLYCERIN (NITROSTAT) 0.4 MG SL tablet Place 0.4 mg under the tongue every 5 (five) minutes as needed for chest pain.  . predniSONE (DELTASONE) 10 MG tablet Take 5 mg by mouth daily with breakfast.                   Physical Exam:  05/24/2020       161 01/05/2020     156  05/12/2019         152  02/03/2019     153 11/05/2018       152  07/31/2018       154   03/07/16 160 lb 12.8 oz (72.9 kg)  01/04/16 160 lb (72.6 kg)  04/26/15 159 lb 4.8 oz (72.3 kg)     Vital signs reviewed  05/24/2020  - Note at rest 02 sats  97% on RA   General appearance:    amb wf nad     HEENT : pt wearing mask not removed for exam due to covid - 19 concerns.    NECK :  without JVD/Nodes/TM/ nl carotid upstrokes bilaterally   LUNGS: no acc muscle use,  Mild barrel  contour chest wall with bilateral  Distant bs s audible wheeze and  without cough on insp or exp maneuvers  and mild  Hyperresonant  to  percussion bilaterally     CV:  RRR  no s3 or murmur or increase in P2, and no edema   ABD:  soft and nontender with pos end  insp Hoover's  in the supine position. No bruits or organomegaly appreciated, bowel sounds nl  MS:   Nl gait/  ext  warm without deformities, calf tenderness, cyanosis or clubbing No obvious joint restrictions   SKIN: warm and dry without lesions    NEURO:  alert, approp, nl sensorium with  no motor or cerebellar deficits apparent.

## 2020-05-24 NOTE — Patient Instructions (Addendum)
I very strongly recommend you get the moderna or pfizer vaccine as soon as possible based on your risk of dying from the virus  and the proven safety and benefit of these vaccines against even the delta and omicron variants.  This can save your life as well as  those of your loved ones,  especially if they are also not vaccinated.     Plan A = Automatic = Always=    symbicort 160 Take 2 puffs first thing in am and then another 2 puffs about 12 hours later.   Work on inhaler technique:  relax and gently blow all the way out then take a nice smooth deep breath back in, triggering the inhaler at same time you start breathing in.  Hold for up to 5 seconds if you can. Blow out thru nose. Rinse and gargle with water when done     Plan B = Backup (to supplement plan A, not to replace it) Only use your albuterol inhaler as a rescue medication to be used if you can't catch your breath by resting or doing a relaxed purse lip breathing pattern.  - The less you use it, the better it will work when you need it. - Ok to use the inhaler up to 2 puffs  every 4 hours if you must but call for appointment if use goes up over your usual need - Don't leave home without it !!  (think of it like the spare tire for your car)    Plan C = Crisis (instead of Plan B but only if Plan B stops working) - only use your albuterol nebulizer if you first try Plan B and it fails to help > ok to use the nebulizer up to every 4 hours but if start needing it regularly call for immediate appointment   Please schedule a follow up visit in 6  months but call sooner if needed

## 2020-05-24 NOTE — Assessment & Plan Note (Signed)
Informed at [facility] asthma center severe gerd likely the cause with large [place] on cxr but "turned surgery down" PFT's  07/30/11   FEV1  1.25 (61 % ) ratio 0.47  p 6 % improvement from saba p ? prior to study with DLCO  94 % and  corrects to 141  % for alv volume c classic f/v loop for  chronic asthma    - FENO 03/07/2016  =   9 on symb 160 2bid during acute flare - 07/31/2018     75% > continue symb 160 2bid  - 11/05/2018  After extensive coaching inhaler device,  effectiveness =    75% (short ti)  - 11/05/18  Alpha one AT :   MM   Level 179  - 02/03/2019  After extensive coaching inhaler device,  effectiveness =    75% (short Ti) > rec trial of breztri 2bid > some better 05/12/2019 so continue - 01/05/2020  After extensive coaching inhaler device,  effectiveness =    75% > rechallenge with breztri > ? Urinary retention //uti  > d/c'd 05/24/2020 and restarted symb 160 2bid    All goals of chronic asthma control met including optimal function and elimination of symptoms with minimal need for rescue therapy.  Contingencies discussed in full including contacting this office immediately if not controlling the symptoms using the rule of two's.    Should do just as well on symbicort 160 with exception of peak ex tolerance w/o the lama which she needs to be off in view of reported bladder dysfunction.          Each maintenance medication was reviewed in detail including emphasizing most importantly the difference between maintenance and prns and under what circumstances the prns are to be triggered using an action plan format where appropriate.  Total time for H and P, chart review, counseling, reviewing hfa device(s) and generating customized AVS unique to this office visit / same day charting = 22 min

## 2020-05-25 DIAGNOSIS — M2550 Pain in unspecified joint: Secondary | ICD-10-CM | POA: Diagnosis not present

## 2020-05-25 DIAGNOSIS — E063 Autoimmune thyroiditis: Secondary | ICD-10-CM | POA: Diagnosis not present

## 2020-05-25 DIAGNOSIS — M797 Fibromyalgia: Secondary | ICD-10-CM | POA: Diagnosis not present

## 2020-05-25 DIAGNOSIS — M353 Polymyalgia rheumatica: Secondary | ICD-10-CM | POA: Diagnosis not present

## 2020-05-25 DIAGNOSIS — E663 Overweight: Secondary | ICD-10-CM | POA: Diagnosis not present

## 2020-05-25 DIAGNOSIS — M1120 Other chondrocalcinosis, unspecified site: Secondary | ICD-10-CM | POA: Diagnosis not present

## 2020-05-25 DIAGNOSIS — Z6827 Body mass index (BMI) 27.0-27.9, adult: Secondary | ICD-10-CM | POA: Diagnosis not present

## 2020-05-25 DIAGNOSIS — Z7952 Long term (current) use of systemic steroids: Secondary | ICD-10-CM | POA: Diagnosis not present

## 2020-05-25 DIAGNOSIS — M545 Low back pain, unspecified: Secondary | ICD-10-CM | POA: Diagnosis not present

## 2020-05-25 DIAGNOSIS — M150 Primary generalized (osteo)arthritis: Secondary | ICD-10-CM | POA: Diagnosis not present

## 2020-05-25 DIAGNOSIS — R5383 Other fatigue: Secondary | ICD-10-CM | POA: Diagnosis not present

## 2020-06-03 DIAGNOSIS — N183 Chronic kidney disease, stage 3 unspecified: Secondary | ICD-10-CM | POA: Diagnosis not present

## 2020-06-03 DIAGNOSIS — E038 Other specified hypothyroidism: Secondary | ICD-10-CM | POA: Diagnosis not present

## 2020-06-03 DIAGNOSIS — K219 Gastro-esophageal reflux disease without esophagitis: Secondary | ICD-10-CM | POA: Diagnosis not present

## 2020-06-03 DIAGNOSIS — J449 Chronic obstructive pulmonary disease, unspecified: Secondary | ICD-10-CM | POA: Diagnosis not present

## 2020-06-03 DIAGNOSIS — I2510 Atherosclerotic heart disease of native coronary artery without angina pectoris: Secondary | ICD-10-CM | POA: Diagnosis not present

## 2020-06-03 DIAGNOSIS — M179 Osteoarthritis of knee, unspecified: Secondary | ICD-10-CM | POA: Diagnosis not present

## 2020-06-03 DIAGNOSIS — E7800 Pure hypercholesterolemia, unspecified: Secondary | ICD-10-CM | POA: Diagnosis not present

## 2020-06-03 DIAGNOSIS — C50911 Malignant neoplasm of unspecified site of right female breast: Secondary | ICD-10-CM | POA: Diagnosis not present

## 2020-06-10 ENCOUNTER — Other Ambulatory Visit: Payer: Self-pay | Admitting: Internal Medicine

## 2020-07-05 DIAGNOSIS — C50911 Malignant neoplasm of unspecified site of right female breast: Secondary | ICD-10-CM | POA: Diagnosis not present

## 2020-07-05 DIAGNOSIS — E7800 Pure hypercholesterolemia, unspecified: Secondary | ICD-10-CM | POA: Diagnosis not present

## 2020-07-05 DIAGNOSIS — M179 Osteoarthritis of knee, unspecified: Secondary | ICD-10-CM | POA: Diagnosis not present

## 2020-07-05 DIAGNOSIS — E038 Other specified hypothyroidism: Secondary | ICD-10-CM | POA: Diagnosis not present

## 2020-07-05 DIAGNOSIS — I2510 Atherosclerotic heart disease of native coronary artery without angina pectoris: Secondary | ICD-10-CM | POA: Diagnosis not present

## 2020-07-05 DIAGNOSIS — K219 Gastro-esophageal reflux disease without esophagitis: Secondary | ICD-10-CM | POA: Diagnosis not present

## 2020-07-05 DIAGNOSIS — J449 Chronic obstructive pulmonary disease, unspecified: Secondary | ICD-10-CM | POA: Diagnosis not present

## 2020-07-05 DIAGNOSIS — N183 Chronic kidney disease, stage 3 unspecified: Secondary | ICD-10-CM | POA: Diagnosis not present

## 2020-07-25 ENCOUNTER — Other Ambulatory Visit: Payer: Self-pay

## 2020-07-25 ENCOUNTER — Ambulatory Visit (INDEPENDENT_AMBULATORY_CARE_PROVIDER_SITE_OTHER): Payer: Medicare Other | Admitting: Dermatology

## 2020-07-25 DIAGNOSIS — L570 Actinic keratosis: Secondary | ICD-10-CM

## 2020-07-25 DIAGNOSIS — Z86006 Personal history of melanoma in-situ: Secondary | ICD-10-CM

## 2020-08-07 ENCOUNTER — Encounter: Payer: Self-pay | Admitting: Dermatology

## 2020-08-09 DIAGNOSIS — H2513 Age-related nuclear cataract, bilateral: Secondary | ICD-10-CM | POA: Diagnosis not present

## 2020-08-09 DIAGNOSIS — H52203 Unspecified astigmatism, bilateral: Secondary | ICD-10-CM | POA: Diagnosis not present

## 2020-08-20 NOTE — Progress Notes (Signed)
   Follow-Up Visit   Subjective  Sonia [name] is a 80 y.o. 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 X 2 weeks very sharp pain but went away. /Check spot on right leg x months on calf. Does not heal. Also check patients back of neck. ).  Pain at site of melanoma removal left upper arm, also check several other spots Location:  Duration:  Quality:  Associated Signs/Symptoms: Modifying Factors:  Severity:  Timing: Context:   Objective  Well appearing patient in no apparent distress; mood and affect are within normal limits. Left Forearm - Anterior Patient had a very sharp pain in her melanoma scar. X 2 weeks has now gone away.  Careful examination of skin and lymph nodes visually and by palpation showed no abnormalities; patient also palpated the area and could not feel any abnormality your produce the pain.  Right Lower Leg - Posterior Subtle pink gritty scale which by history may be starting to resolve  Left Forearm - Posterior, Right Forearm - Posterior 2 to 4 mm gritty pink crusts    A focused examination was performed including legs, arm, neck. Relevant physical exam findings are noted in the Assessment and Plan.   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 Yearly skin exams.  Recheck if pain recurs.  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  AK (actinic keratosis) (2) Left Forearm - Posterior; Right Forearm - Posterior  Will use topical fluorouracil cream treatment in the winter.       I, Lavonna Monarch, MD, have reviewed all documentation for this visit.  The documentation on 08/20/20 for the exam, diagnosis, procedures, and orders are all accurate and complete.

## 2020-08-30 DIAGNOSIS — M1120 Other chondrocalcinosis, unspecified site: Secondary | ICD-10-CM | POA: Diagnosis not present

## 2020-08-30 DIAGNOSIS — M150 Primary generalized (osteo)arthritis: Secondary | ICD-10-CM | POA: Diagnosis not present

## 2020-08-30 DIAGNOSIS — Z6827 Body mass index (BMI) 27.0-27.9, adult: Secondary | ICD-10-CM | POA: Diagnosis not present

## 2020-08-30 DIAGNOSIS — Z7952 Long term (current) use of systemic steroids: Secondary | ICD-10-CM | POA: Diagnosis not present

## 2020-08-30 DIAGNOSIS — M353 Polymyalgia rheumatica: Secondary | ICD-10-CM | POA: Diagnosis not present

## 2020-08-30 DIAGNOSIS — E663 Overweight: Secondary | ICD-10-CM | POA: Diagnosis not present

## 2020-08-30 DIAGNOSIS — M797 Fibromyalgia: Secondary | ICD-10-CM | POA: Diagnosis not present

## 2020-08-31 ENCOUNTER — Encounter: Payer: Self-pay | Admitting: Internal Medicine

## 2020-08-31 DIAGNOSIS — N183 Chronic kidney disease, stage 3 unspecified: Secondary | ICD-10-CM | POA: Diagnosis not present

## 2020-08-31 DIAGNOSIS — E038 Other specified hypothyroidism: Secondary | ICD-10-CM | POA: Diagnosis not present

## 2020-08-31 DIAGNOSIS — E7800 Pure hypercholesterolemia, unspecified: Secondary | ICD-10-CM | POA: Diagnosis not present

## 2020-08-31 DIAGNOSIS — J449 Chronic obstructive pulmonary disease, unspecified: Secondary | ICD-10-CM | POA: Diagnosis not present

## 2020-08-31 DIAGNOSIS — C50911 Malignant neoplasm of unspecified site of right female breast: Secondary | ICD-10-CM | POA: Diagnosis not present

## 2020-08-31 DIAGNOSIS — M179 Osteoarthritis of knee, unspecified: Secondary | ICD-10-CM | POA: Diagnosis not present

## 2020-08-31 DIAGNOSIS — K219 Gastro-esophageal reflux disease without esophagitis: Secondary | ICD-10-CM | POA: Diagnosis not present

## 2020-08-31 DIAGNOSIS — I2510 Atherosclerotic heart disease of native coronary artery without angina pectoris: Secondary | ICD-10-CM | POA: Diagnosis not present

## 2020-09-02 ENCOUNTER — Other Ambulatory Visit: Payer: Self-pay | Admitting: Oncology

## 2020-09-02 DIAGNOSIS — Z9889 Other specified postprocedural states: Secondary | ICD-10-CM

## 2020-09-06 ENCOUNTER — Telehealth: Payer: Self-pay | Admitting: Internal Medicine

## 2020-09-06 NOTE — Telephone Encounter (Signed)
ATC X2 line was answered and then hung up.

## 2020-09-08 NOTE — Telephone Encounter (Signed)
ATC pt. Line rang twice then disconnected. Will try to reach pt once more before closing encounter.

## 2020-09-10 DIAGNOSIS — R399 Unspecified symptoms and signs involving the genitourinary system: Secondary | ICD-10-CM | POA: Diagnosis not present

## 2020-09-13 NOTE — Telephone Encounter (Signed)
ATC pt. Line rings then disconnects. Will close encounter as we have tried several times to reach pt without success.

## 2020-09-14 ENCOUNTER — Telehealth: Payer: Self-pay | Admitting: Internal Medicine

## 2020-09-14 NOTE — Telephone Encounter (Signed)
ATC pt X2 both times line was answered, nothing said and then disconnected. Will attempt to contact pt at later time.

## 2020-09-15 NOTE — Telephone Encounter (Signed)
ATC [id number], rang once and disconnected. I called [phone] and patient answered. I informed patient that the 540 is not working when we call. Patient stated is needing samples of Symbicort as she is in the donut hole and cannot afford Symbicort. I informed patient that we no longer get samples of Symbicort but to call insurance and see what else is similar and affordable and Dr .Melvyn Novas can choose from the choices given and we may have samples of those to help. Patient is also requesting a new neb machine as hers is over 99 years old and not working right and requesting a refill on albuterol nebs. Will route to Dr. Melvyn Novas.  Dr. Melvyn Novas, please advise on neb machine and neb meds. Thanks!

## 2020-09-15 NOTE — Telephone Encounter (Signed)
Needs ov with drug formulary in hand and in meantime give breztri Take 2 puffs first thing in am and then another 2 puffs about 12 hours later with enough samples till next opening - ok to use today if still available openings

## 2020-09-15 NOTE — Telephone Encounter (Signed)
Spoke with pt and offered OV today with Dr. Melvyn Novas but pt stated she could not make it in today. First available OV is 10/11/20 which pt was scheduled for. Pt states she was changed from [place] to Symbicort r/t increased bladder infections. Pt also stated that she had left over Breztri that she can use at home and refused to come pick up samples. Pt was offered NP appointment but refused as well. Pt instructed that if symptoms got worse she should seek an emergency medica evaluation. Pt was instructed that she should bring formulary with her to her OV in Aug. Nothing further needed at this time.

## 2020-10-11 ENCOUNTER — Ambulatory Visit (INDEPENDENT_AMBULATORY_CARE_PROVIDER_SITE_OTHER): Payer: Medicare Other | Admitting: Internal Medicine

## 2020-10-11 ENCOUNTER — Other Ambulatory Visit: Payer: Self-pay

## 2020-10-11 ENCOUNTER — Encounter: Payer: Self-pay | Admitting: Internal Medicine

## 2020-10-11 VITALS — BP 108/74 | HR 94 | Ht 64.0 in | Wt 159.6 lb

## 2020-10-11 DIAGNOSIS — J4540 Moderate persistent asthma, uncomplicated: Secondary | ICD-10-CM | POA: Diagnosis not present

## 2020-10-11 DIAGNOSIS — J449 Chronic obstructive pulmonary disease, unspecified: Secondary | ICD-10-CM | POA: Diagnosis not present

## 2020-10-11 MED ORDER — BREZTRI AEROSPHERE 160-9-4.8 MCG/ACT IN AERO
2.0000 | INHALATION_SPRAY | Freq: Two times a day (BID) | RESPIRATORY_TRACT | 0 refills | Status: DC
Start: 2020-10-11 — End: 2020-10-11

## 2020-10-11 NOTE — Progress Notes (Signed)
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 Asthma flare Chief Complaint  Patient presents with   Acute Visit    Pt c/o cough, wheezing and increased SOB x 2 wks. She is coughing up clear sputum.   prev w/u showed bad reflux  maint at baseline symb 160 2 bid and no proair  02/24/16 called in 7 days of worse cough/ wheeze/sob> rx medrol not clear she took it Exp to gg child sick prior to onset of cough rec Take 4 for three days 3 for three days 2 for three days 1 for three days and stop  Start Pantoprazole (protonix) 40 mg   Take  30-60 min before first meal of the day and Pepcid (famotidine)  20 mg one @  bedtime until return to office - this is the best way to tell whether stomach acid is contributing to your problem.      07/31/2018 acute extended ov/Sonia [name] re:  Worse sob since   Nov 2018  Now on omeprazole '20mg'$  60 min ac  Early march 2020 worse sob despite symbicort 160 2 bid   No cough Sleeping on wedge  Room to room at home  rec Try omeprazole 20 mg x 2 x 30-60 min before your first meal Symbicort 160 Take 2 puffs first thing in am and then another 2 puffs about 12 hours later.  Only use your albuterol as a rescue medication  Please schedule a follow up visit in 3 months but call sooner if needed  with all medications /inhalers/ solutions in hand so we can verify exactly what you are taking. This includes all medications from all doctors and over the counters   11/05/2018   f/u ov/Sonia [name] re: chronic asthma on ppi 20 mg qd and symb 160 2bid / no meds in hand as req Chief Complaint  Patient presents with   Follow-up    Pt states her breathing has been some worse with the humid weather. She has used her albuterol inhaler 2 x since the last visit.   Dyspnea:  Aerobics/ swimming now again but doesn't do well out in heat/ humidity  Cough: no  Sleeping: no resp issues   SABA use: min hfa/ no neb 02: no rec Work on inhaler technique:   Please schedule a follow up visit in 6 months but call sooner if needed  with all medications /inhalers/ solutions in hand so we can verify exactly what you are taking. This includes all medications from all doctors and over the counters     02/03/2019  f/u ov/Sonia [name] re: chronic asthma, worse since September 2020 with overt gerd  Chief Complaint  Patient presents with   Follow-up    Breathing is okay today. She is using her albuterol inhaler 1-2 x per wk on average.   Dyspnea:  Fine sitting still but doe across the room on symb 160 2bid and did not prev respond to singulair  Cough:  worse bending over / non productive, on prilosec 20 mg ac qd  With overt HB Sleeping: no resp symptoms  SABA use: 1-2x weekly when overdoes it, never pre-challenges / has neb but no meds for it x years. 02: none  rec  GERD   Stop symbicort and start Breztri Take 2 puffs first thing in am and then another 2 puffs about 12 hours later. Only use your albuterol as a rescue medication   Discuss with Dr Sonia [name] re optimal options to treat your reflux    05/12/2019  f/u ov/Sonia [name] re:  Chronic asthma vs ACOS  Chief Complaint  Patient presents with   Follow-up    3 month f/u. States breathing has been stable since last visit.   Dyspnea:   MMRC2 = can't walk a nl pace on a flat grade s sob but does fine slow and flat   [employer] Cough: none Sleeping: no resp symptoms  SABA use: same need as previous 02: none  Overt hb/ buccini following and says "concerned  about bones so doesn't want to use PPI" rec No change   Dx April 2021  PMR and placed on placed on prednisone   01/05/2020  f/u ov/Sonia [name] re: ? ACOS  symbicort off ppi only taking tums now on symb 160 and pred 15 mg daily  Chief Complaint  Patient presents with   Follow-up    pt c/o increased sob with exertion, states this is d/t ragweed allergy.    Dyspnea:  Worse off breztri ?  / on pred for PMR   Cough :  None  Sleeping: 2 pillows / bed is flat  SABA use: not sure it helps /never prechallenges as rec  02: none  rec Plan A = Automatic = Always=    Breztri Take 2 puffs first thing in am and then another 2 puffs about 12 hours later x 2 weeks trial > if not better resume the symbicort. Work on inhaler technique:  Plan B = Backup (to supplement plan A, not to replace it) Only use your albuterol inhaler as a rescue medication Plan C = Crisis (instead of Plan B but only if Plan B stops working) - only use your albuterol nebulizer if you first try Plan B   05/24/2020  f/u ov/Sonia [name] re: ACOS / large [place] with GERD/Buccini Chief Complaint  Patient presents with   Follow-up    Increased SOB "since the allergies are out". She rarely uses her albuterol inhaler or neb.  Dyspnea:  Not sure ? HT a little easier but new bladder symptoms on Breztri  Cough: none, does not blow breztri out thru nose Sleeping: 2 pillow/ bed flat  SABA use: rarely use  02: not Covid status:   Not vaccinated  Rec  I very strongly recommend you get the moderna or pfizer vaccine   Plan A = Automatic = Always=   Symbicort 160 Take 2 puffs first thing in am and then another 2 puffs about 12 hours later.  Work on Orthoptist B = Backup (to supplement plan A, not to replace it) Only use your albuterol inhaler as a rescue medication  Plan C = Crisis (instead of Plan B but only if Plan B stops working) - only use your albuterol nebulizer if you first try Plan B and it fails to help > ok to use the nebulizer up  to every 4 hours but if start needing it regularly call for immediate appointment    10/11/2020  f/u ov/Sonia [name] re: ACOS/ large HH back on breztri   prednisone 2-1-'2mg'$  alternating for pmr / changed symbicort to breztri and worse, better back on breztri  Chief Complaint  Patient presents with   Follow-up    Shortness of breath    Dyspnea:  can do ht early in am  Cough: assoc with pnds to point of gagging  /no recent abx Sleeping: bed is 10 degrees with bed blocks plus sev  pillows  SABA use: hfa twice daily / doest not have functioning neb as per AVS recs ("forgot to tell you it wasn't working/ I thought you knew that and had ordered it" - record indicates this was not the case at all - see phone note 09/14/20)  02: none Covid status:   never vax    No obvious day to day or daytime variability or assoc excess/ purulent sputum or mucus plugs or hemoptysis or cp or chest tightness, subjective wheeze or overt sinus or hb symptoms.   Sleeping better now  without nocturnal  or early am exacerbation  of respiratory  c/o's or need for noct saba. Also denies any obvious fluctuation of symptoms with weather or environmental changes or other aggravating or alleviating factors except as outlined above   No unusual exposure hx or h/o childhood pna/ asthma or knowledge of premature birth.  Current Allergies, Complete Past Medical History, Past Surgical History, Family History, and Social History were reviewed in [employer] record.  ROS  The following are not active complaints unless bolded Hoarseness, sore throat, dysphagia, dental problems, itching, sneezing,  nasal congestion or discharge of excess mucus or purulent secretions, ear ache,   fever, chills, sweats, unintended wt loss or wt gain, classically pleuritic or exertional cp,  orthopnea pnd or arm/hand swelling  or leg swelling, presyncope, palpitations, abdominal pain, anorexia, nausea, vomiting, diarrhea  or change in bowel  habits or change in bladder habits, change in stools or change in urine, dysuria, hematuria,  rash, arthralgias, visual complaints, headache, numbness, weakness or ataxia or problems with walking or coordination,  change in mood or  memory.        Current Meds  Medication Sig   albuterol (VENTOLIN HFA) 108 (90 Base) MCG/ACT inhaler INHALE 2 PUFFS BY MOUTH EVERY 6 HOURS AS NEEDED FOR WHEEZING OR SHORTNESS OF BREATH   Budeson-Glycopyrrol-Formoterol (BREZTRI AEROSPHERE) 160-9-4.8 MCG/ACT AERO Inhale 2 puffs into the lungs 2 (two) times daily.   calcium carbonate (TUMS - DOSED IN MG ELEMENTAL CALCIUM) 500 MG chewable tablet Chew 500 mg by mouth daily as needed for indigestion or heartburn.   cetirizine (ZYRTEC) 10 MG tablet Take 10 mg by mouth daily.   Cholecalciferol 25 MCG (1000 UT) capsule Take 10,000 Units by mouth 3 (three) times a week.   cromolyn (NASALCROM) 5.2 MG/ACT nasal spray Place 2 sprays into both nostrils daily as needed for allergies.   Cyanocobalamin (B-12) 5000 MCG CAPS Take 5,000 mcg by mouth every Monday, Wednesday, and Friday.   levothyroxine (SYNTHROID) 112 MCG tablet Synthroid 112 mcg tablet  TAKE 1 TABLET BY MOUTH ONCE DAILY IN THE MORNING ON AN EMPTY STOMACH   nitrofurantoin, macrocrystal-monohydrate, (MACROBID) 100 MG capsule Take 100 mg by mouth 2 (two) times daily.   nitroGLYCERIN (NITROSTAT) 0.4 MG SL tablet Place 0.4 mg under the tongue every 5 (five) minutes as needed for chest pain.   predniSONE (DELTASONE) 10 MG tablet Take 5 mg by mouth daily with breakfast.   rosuvastatin (CRESTOR) 5 MG  tablet 1 tablet   tretinoin (RETIN-A) 0.1 % cream Apply topically at bedtime.                       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    NECK :  without JVD/Nodes/TM/ nl carotid upstrokes bilaterally   LUNGS: no acc muscle use,  Mild barrel  contour chest wall with bilateral  Distant bs s audible wheeze and  without cough on insp or exp maneuvers  and mild  Hyperresonant  to  percussion bilaterally     CV:  RRR  no s3 or murmur or increase in P2, and no edema   ABD:  soft and nontender with pos end  insp Hoover's  in the supine position. No bruits or organomegaly appreciated, bowel sounds nl  MS:   Nl gait/  ext warm without deformities, calf tenderness, cyanosis or clubbing No obvious joint restrictions   SKIN: warm and dry without lesions    NEURO:  alert, approp, nl sensorium with  no motor or cerebellar deficits apparent.

## 2020-10-11 NOTE — Patient Instructions (Addendum)
We need to be sure you have the nebulizer   Plan A = Automatic = Always=    breztri  (or symbicort 160) Take 2 puffs first thing in am and then another 2 puffs about 12 hours later.    Plan B = Backup (to supplement plan A, not to replace it) Only use your albuterol inhaler as a rescue medication to be used if you can't catch your breath by resting or doing a relaxed purse lip breathing pattern.  - The less you use it, the better it will work when you need it. - Ok to use the inhaler up to 2 puffs  every 4 hours if you must but call for appointment if use goes up over your usual need - Don't leave home without it !!  (think of it like the spare tire for your car)   Plan C = Crisis (instead of Plan B but only if Plan B stops working) - only use your albuterol nebulizer if you first try Plan B and it fails to help > ok to use the nebulizer up to every 4 hours but if start needing it regularly call for immediate appointment   Please schedule a follow up visit in 3 months but call sooner if needed

## 2020-10-11 NOTE — Assessment & Plan Note (Addendum)
Informed at [facility] asthma center severe gerd likely the cause with large [place] on cxr but "turned surgery down" PFT's  07/30/11   FEV1  1.25 (61 % ) ratio 0.47  p 6 % improvement from saba p ? prior to study with DLCO  94 % and  corrects to 141  % for alv volume c classic f/v loop for  chronic asthma    - FENO 03/07/2016  =   9 on symb 160 2bid during acute flare - 07/31/2018     75% > continue symb 160 2bid  - 11/05/2018  After extensive coaching inhaler device,  effectiveness =    75% (short ti)  - 11/05/18  Alpha one AT :   MM   Level 179  - 02/03/2019  After extensive coaching inhaler device,  effectiveness =    75% (short Ti) > rec trial of breztri 2bid > some better 05/12/2019 so continue - 01/05/2020  After extensive coaching inhaler device,  effectiveness =    75% > rechallenge with breztri > ? Urinary retention //uti  > d/c'd 05/24/2020 and restarted symb 160 2bid > better but then breathing worse so restarted breztri  10/11/2020  After extensive coaching inhaler device,  effectiveness =    75%  From a baseline of well < 50% due to short Ti   DDX of  difficult airways management almost all start with A and  include Adherence, Ace Inhibitors, Acid Reflux, Active Sinus Disease, Alpha 1 Antitripsin deficiency, Anxiety masquerading as Airways dz,  ABPA,  Allergy(esp in young), Aspiration (esp in elderly), Adverse effects of meds,  Active smoking or vaping, A bunch of PE's (a small clot burden can't cause this syndrome unless there is already severe underlying pulm or vascular dz with poor reserve) plus two Bs  = Bronchiectasis and Beta blocker use..and one C= CHF   Adherence is always the initial "prime suspect" and is a multilayered concern that requires a "trust but verify" approach in every patient - starting with knowing how to use medications, especially inhalers, correctly, keeping up with refills and understanding the fundamental difference between maintenance and prns vs those medications only taken  for a very short course and then stopped and not refilled. - - see hfa teaching and confusion re nebulizer status despite pt = nurse - return with all meds in hand using a trust but verify approach to confirm accurate Medication  Reconciliation The principal here is that until we are certain that the  patients are doing what we've asked, it makes no sense to ask them to do more.   ? Adverse drug effects > advised against repeating macrodantin for next uti,  ? Anxiety/depression/ deconditioning  > usually at the bottom of this list of usual suspects but  may interfere with adherence and also interpretation of response or lack thereof to symptom management which can be quite subjective.   F/u 3 months         Each maintenance medication was reviewed in detail including emphasizing most importantly the difference between maintenance and prns and under what circumstances the prns are to be triggered using an action plan format where appropriate.  Total time for H and P, chart review, counseling, reviewing hfa/ neb device(s) and generating customized AVS unique to this office visit / same day charting = 35 min

## 2020-10-13 ENCOUNTER — Telehealth: Payer: Self-pay | Admitting: Internal Medicine

## 2020-10-13 DIAGNOSIS — J449 Chronic obstructive pulmonary disease, unspecified: Secondary | ICD-10-CM

## 2020-10-13 NOTE — Telephone Encounter (Signed)
Please see below regarding Nebulizer order from [facility].  Sonia [name]; Brenda, Reece Levy, Dawn; Melvyn Novas, Christena Deem, MD Hello,   Wanted to reach out and let you know I have reached out to the pt about getting her new Neb.  During our call she expressed the difficulty in paying for her inhalers and I explained that if exchanging to Nebulized medications we could bill through her part B and secondary insurance but that I would have to check with her Dr. She asked that I reach out and see if this is something that she could do.  I checked with our Respiratory Therapist and based on her current Inhalers they could be replaced with Yupelri QD, Budesonide BID, Perforomist BID and keep the Albuterol for rescue.  Just wanted to make you aware that this Pt is interested in exchanging her inhalers for Neb meds and with her insuranceshe will be covered 100%.

## 2020-10-13 NOTE — Telephone Encounter (Signed)
Ok to order neb and supplies

## 2020-10-14 MED ORDER — BUDESONIDE 0.5 MG/2ML IN SUSP: 0.5000 mg | Freq: Two times a day (BID) | RESPIRATORY_TRACT | 5 refills | Status: DC

## 2020-10-14 MED ORDER — FORMOTEROL FUMARATE 20 MCG/2ML IN NEBU: 20.0000 ug | INHALATION_SOLUTION | Freq: Two times a day (BID) | RESPIRATORY_TRACT | 5 refills | Status: DC

## 2020-10-14 MED ORDER — YUPELRI 175 MCG/3ML IN SOLN: 175.0000 ug | Freq: Every day | RESPIRATORY_TRACT | 5 refills | Status: AC

## 2020-10-14 NOTE — Telephone Encounter (Signed)
yes

## 2020-10-14 NOTE — Telephone Encounter (Signed)
Dr Melvyn Novas- I already ordered neb machine and supplies. [facility] is stating that the pt expressed to them that she can not afford for inhalers and wants to switch all of her meds to nebs- yupelri, budesonide and perforomist. Just wanted to clarify to be sure you are wanting her on all of these neb meds.

## 2020-10-14 NOTE — Telephone Encounter (Signed)
Medications sent in to [place].   Nothing further needed at this time.

## 2020-10-21 ENCOUNTER — Telehealth: Payer: Self-pay | Admitting: Internal Medicine

## 2020-10-21 NOTE — Telephone Encounter (Signed)
LMTCB

## 2020-10-21 NOTE — Telephone Encounter (Signed)
Patient called office.  Patient stated Dr. Melvyn Novas started her on new nebs.  Patient stated she is currently taking perforomist, yupelri, and budesonide. Patient stated she noticed after starting budesonide yesterday she felt shaky, lightheaded, and headache.  Patient stated she done budesonide twice yesterday and felt the same after both treatments.  Patient stated she done budesonide at 0830 this morning and had same effects, shaky, dizziness, headache, and very unsteady.    Message routed to Dr. Melvyn Novas to advise  LOV- 10/11/20  We need to be sure you have the nebulizer    Plan A = Automatic = Always=    breztri  (or symbicort 160) Take 2 puffs first thing in am and then another 2 puffs about 12 hours later.    Plan B = Backup (to supplement plan A, not to replace it) Only use your albuterol inhaler as a rescue medication to be used if you can't catch your breath by resting or doing a relaxed purse lip breathing pattern.  - The less you use it, the better it will work when you need it. - Ok to use the inhaler up to 2 puffs  every 4 hours if you must but call for appointment if use goes up over your usual need - Don't leave home without it !!  (think of it like the spare tire for your car)    Plan C = Crisis (instead of Plan B but only if Plan B stops working) - only use your albuterol nebulizer if you first try Plan B and it fails to help > ok to use the nebulizer up to every 4 hours but if start needing it regularly call for immediate appointment     Please schedule a follow up visit in 3 months but call sooner if needed

## 2020-10-21 NOTE — Telephone Encounter (Signed)
That would be extremely unusual and more likely it's the brovana but ok to leave off budesonide for the weekend and if the problem continues simply cut the dose of brovana in half  and then if still happens do the same with yupelri and check back with [place] p the first of the week to report

## 2020-10-21 NOTE — Telephone Encounter (Signed)
ATC Patient x's 3.  Phone rings, someone picks up, and hangs up. Will try again at a later time.

## 2020-10-21 NOTE — Telephone Encounter (Signed)
Spoke with the pt  Notified of response per MW  She will call back to let us know how she feels next wk  Nothing further needed at this time

## 2020-11-12 DIAGNOSIS — H1013 Acute atopic conjunctivitis, bilateral: Secondary | ICD-10-CM | POA: Diagnosis not present

## 2020-11-12 DIAGNOSIS — J45901 Unspecified asthma with (acute) exacerbation: Secondary | ICD-10-CM | POA: Diagnosis not present

## 2020-11-12 DIAGNOSIS — J209 Acute bronchitis, unspecified: Secondary | ICD-10-CM | POA: Diagnosis not present

## 2020-11-25 DIAGNOSIS — N183 Chronic kidney disease, stage 3 unspecified: Secondary | ICD-10-CM | POA: Diagnosis not present

## 2020-11-25 DIAGNOSIS — E7800 Pure hypercholesterolemia, unspecified: Secondary | ICD-10-CM | POA: Diagnosis not present

## 2020-11-25 DIAGNOSIS — M353 Polymyalgia rheumatica: Secondary | ICD-10-CM | POA: Diagnosis not present

## 2020-11-25 DIAGNOSIS — E538 Deficiency of other specified B group vitamins: Secondary | ICD-10-CM | POA: Diagnosis not present

## 2020-11-25 DIAGNOSIS — C50911 Malignant neoplasm of unspecified site of right female breast: Secondary | ICD-10-CM | POA: Diagnosis not present

## 2020-11-25 DIAGNOSIS — K219 Gastro-esophageal reflux disease without esophagitis: Secondary | ICD-10-CM | POA: Diagnosis not present

## 2020-11-25 DIAGNOSIS — E559 Vitamin D deficiency, unspecified: Secondary | ICD-10-CM | POA: Diagnosis not present

## 2020-11-25 DIAGNOSIS — E063 Autoimmune thyroiditis: Secondary | ICD-10-CM | POA: Diagnosis not present

## 2020-11-25 DIAGNOSIS — I2510 Atherosclerotic heart disease of native coronary artery without angina pectoris: Secondary | ICD-10-CM | POA: Diagnosis not present

## 2020-11-25 DIAGNOSIS — J45901 Unspecified asthma with (acute) exacerbation: Secondary | ICD-10-CM | POA: Diagnosis not present

## 2020-11-25 DIAGNOSIS — J449 Chronic obstructive pulmonary disease, unspecified: Secondary | ICD-10-CM | POA: Diagnosis not present

## 2020-11-25 DIAGNOSIS — R2689 Other abnormalities of gait and mobility: Secondary | ICD-10-CM | POA: Diagnosis not present

## 2020-11-25 DIAGNOSIS — R7301 Impaired fasting glucose: Secondary | ICD-10-CM | POA: Diagnosis not present

## 2020-11-25 DIAGNOSIS — E038 Other specified hypothyroidism: Secondary | ICD-10-CM | POA: Diagnosis not present

## 2020-11-29 ENCOUNTER — Ambulatory Visit: Payer: Medicare Other | Admitting: Internal Medicine

## 2020-11-29 ENCOUNTER — Ambulatory Visit: Payer: Medicare Other | Admitting: Dermatology

## 2020-12-07 ENCOUNTER — Encounter: Payer: Self-pay | Admitting: Internal Medicine

## 2020-12-14 ENCOUNTER — Other Ambulatory Visit: Payer: Self-pay

## 2020-12-14 ENCOUNTER — Encounter (HOSPITAL_BASED_OUTPATIENT_CLINIC_OR_DEPARTMENT_OTHER): Payer: Self-pay | Admitting: Physical Therapy

## 2020-12-14 ENCOUNTER — Ambulatory Visit (HOSPITAL_BASED_OUTPATIENT_CLINIC_OR_DEPARTMENT_OTHER): Payer: Medicare Other | Attending: Family Medicine | Admitting: Physical Therapy

## 2020-12-14 DIAGNOSIS — Z8701 Personal history of pneumonia (recurrent): Secondary | ICD-10-CM | POA: Insufficient documentation

## 2020-12-14 DIAGNOSIS — Z853 Personal history of malignant neoplasm of breast: Secondary | ICD-10-CM | POA: Diagnosis not present

## 2020-12-14 DIAGNOSIS — R262 Difficulty in walking, not elsewhere classified: Secondary | ICD-10-CM | POA: Diagnosis not present

## 2020-12-14 DIAGNOSIS — J45909 Unspecified asthma, uncomplicated: Secondary | ICD-10-CM | POA: Insufficient documentation

## 2020-12-14 DIAGNOSIS — M353 Polymyalgia rheumatica: Secondary | ICD-10-CM | POA: Insufficient documentation

## 2020-12-14 DIAGNOSIS — R293 Abnormal posture: Secondary | ICD-10-CM | POA: Insufficient documentation

## 2020-12-14 NOTE — Therapy (Signed)
OUTPATIENT PHYSICAL THERAPY LOWER EXTREMITY EVALUATION   Patient Name: Sonia [name] MRN: [id number] DOB:1940-03-16, 80 y.o., female Today's Date: 12/14/2020   PT End of Session - 12/14/20 1240     Visit Number 1    Number of Visits 17    Date for PT Re-Evaluation 02/10/21    Authorization Type MCR    Progress Note Due on Visit 10    PT Start Time 1140    PT Stop Time 1225    PT Time Calculation (min) 45 min    Activity Tolerance Patient tolerated treatment well;Treatment limited secondary to medical complications (Comment);Other (comment)   walking limited by pulmonary status   Behavior During Therapy [facility] for tasks assessed/performed             Past Medical History:  Diagnosis Date   Arthritis    Asthma    daily and prn inhalers; states good control currently   CAD S/P percutaneous coronary angioplasty 07/15/2016   COPD (chronic obstructive pulmonary disease) (HCC)    GERD (gastroesophageal reflux disease)    no current med.   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   Vitamin D deficiency    Past Surgical History:  Procedure Laterality Date   ABDOMINAL HYSTERECTOMY     partial   APPENDECTOMY     BREAST BIOPSY Left    BREAST CYST EXCISION Left    BREAST EXCISIONAL BIOPSY Left 03/2015   BREAST LUMPECTOMY Right 08/2018   BREAST LUMPECTOMY WITH RADIOACTIVE SEED LOCALIZATION Left 04/07/2015   Procedure: BREAST LUMPECTOMY WITH RADIOACTIVE SEED LOCALIZATION;  Surgeon: Erroll Luna, MD;  Location: [place];  Service: General;  Laterality: Left;   BREAST LUMPECTOMY WITH RADIOACTIVE SEED LOCALIZATION Right 08/13/2018   Procedure: RIGHT BREAST LUMPECTOMY WITH RADIOACTIVE SEED LOCALIZATION;  Surgeon:  Rolm Bookbinder, MD;  Location: [place];  Service: General;  Laterality: Right;   CORONARY STENT INTERVENTION N/A 07/15/2016   Procedure: Coronary Stent Intervention;  Surgeon: Sherren Mocha, MD;  Location: [place] [facility];  Service: Cardiovascular;  Laterality: N/A;   ESOPHAGOGASTRODUODENOSCOPY (EGD) WITH PROPOFOL Left 07/25/2016   Procedure: ESOPHAGOGASTRODUODENOSCOPY (EGD) WITH PROPOFOL;  Surgeon: Ronnette Juniper, MD;  Location: [place];  Service: Gastroenterology;  Laterality: Left;   KNEE ARTHROSCOPY Right 12/07/2003   LAPAROSCOPIC SALPINGO OOPHERECTOMY Right 09/08/2003   LEFT HEART CATH N/A 07/15/2016   Procedure: Left Heart Cath;  Surgeon: Sherren Mocha, MD;  Location: [place] [facility];  Service: Cardiovascular;  Laterality: N/A;   LEFT HEART CATH AND CORONARY ANGIOGRAPHY N/A 04/14/2019   Procedure: LEFT HEART CATH AND CORONARY ANGIOGRAPHY;  Surgeon: Burnell Blanks, MD;  Location: [place] [facility];  Service: Cardiovascular;  Laterality: N/A;   LYSIS OF ADHESION  09/08/2003   NASAL SINUS SURGERY  age 59   RECTAL POLYPECTOMY  10/02/2006   Patient Active Problem List   Diagnosis Date Noted   Coronary artery disease of native artery of native heart with stable angina pectoris (HCC)    Asthma, chronic, moderate persistent, uncomplicated 86/57/8469   DOE (dyspnea on exertion) 07/31/2018   Osteopenia 06/04/2018   Carcinoma of upper-outer quadrant of right breast in female, estrogen receptor positive ([place])  05/30/2018   GI bleed 07/25/2016   Melena 07/24/2016   Acute renal insufficiency 07/24/2016   CAD S/P percutaneous coronary angioplasty 07/16/2016   NSTEMI (non-ST elevated myocardial infarction) ([place]) 07/13/2016   Severe asthma with acute exacerbation 03/07/2016   Genetic testing 06/09/2015   Family history of breast cancer in female 05/10/2015   History of melanoma 05/10/2015   Atypical lobular hyperplasia of left breast 04/26/2015   Vision problem  01/08/2014   Hiatus hernia syndrome 08/01/2012   Palpitations 10/18/2010   Acute chest pain 10/18/2010   Hypothyroidism 10/18/2010   Dyslipidemia 06/07/2009   Upper airway cough syndrome 03/29/2009   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 Got ove that with steroids and asthma is kicking in and not doing exericse. Using various medications for asthma. My only exercise is going to [employer] weekly. Heat and humidity make my asthma worse. Periodic onset of sciatica but I do some stretching and it is okay.   PERTINENT HISTORY: asthma  PAIN:  Are you having pain? No   PRECAUTIONS: None  WEIGHT BEARING RESTRICTIONS No  FALLS:  Has patient fallen in last 6 months? No,  LIVING ENVIRONMENT: Lives with: lives with their family, dog Lives in: House/apartment Stairs: No;    OCCUPATION: retired Marine scientist  PLOF: Independent  PATIENT GOALS back to being functional   OBJECTIVE:    COGNITION:  Overall cognitive status: Within functional limits for tasks assessed     SENSATION:  Light touch: Appears intact    POSTURE:  Incr mid-thoracic kyphosis with forward head, rounded shoulders and collapse on ant rib cage, overuse of upper traps & cervical musculature for respiration  UE AROM/PROM:   WFL with good flexibility   UE MMT: gross   LE gross 5/5 in straight plane testing; UE gross 4/5   FUNCTIONAL TESTS:  5 times sit to stand: 10s 6 minute walk test: 682; stopped at 2 min (330 ft) took a 2 min rest break to catch breath  5TSTS did  not incr breathing significantly     TODAY'S TREATMENT: Eval Hooklying transverse abdominis engagement to reduce rib cage flare- breathing focused to upper rib cage    PATIENT EDUCATION:  Education details: Anatomy of condition, POC, HEP, exercise form/rationale, aquatics  Person educated: Patient Education method: Consulting civil engineer, Media planner, Tactile cues, Verbal cues, and Handouts Education comprehension: verbalized understanding, returned demonstration, verbal cues required, tactile cues required, and needs further education   HOME EXERCISE PROGRAM: [id number]  Stand/walk during TV commercials, if reading- set timer and walk every 45 min for 5-10 min  ASSESSMENT:  CLINICAL IMPRESSION: Patient is a 80 y.o. F who was seen today for physical therapy evaluation and treatment for difficulty with gait and mobility due to to pulmonary limitations. Objective impairments include decreased activity tolerance, decreased endurance, difficulty walking, decreased strength, increased muscle spasms, impaired flexibility, and postural dysfunction. These impairments are limiting patient from cleaning, community activity, and lives independently- has difficulty with endurance to complete ADLs . Personal factors including Fitness, Time since onset of injury/illness/exacerbation, and 3+ comorbidities: h/o  breast CA, pneumonia, asthma  are also affecting patient's functional outcome. Patient will benefit from skilled PT to address above impairments and improve overall function. We stood and talked in the aquatics facility to ensure that pt would tolerate temp and humidity and she verbalized readiness for aquatic treatments.   REHAB POTENTIAL: Good  CLINICAL DECISION MAKING: Unstable/unpredictable  EVALUATION COMPLEXITY: High   GOALS: Goals reviewed with patient? Yes  SHORT TERM GOALS:  STG Name Target Date Goal status  1 Pt will verbalize notable decrease in necessary rest breaks when performing  daily activities in her home Baseline: frequent and sits a lot at eval 01/06/21 INITIAL  2 Pt will demo ability to focus breathing through mid, anterior rib cage with anchor of lower rib cage flare Baseline: began educating at eval 01/06/21 INITIAL                            LONG TERM GOALS:   LTG Name Target Date Goal status  1 Pt will complete a 6MWT without rest break Baseline:1 standing rest break taken at eval that lasted 2 min 02/10/21 INITIAL  2 Pt will be able to recognize breathing difficulty to take breaks at a moderate limitation to improve endurance Baseline: began discussing at eval- notes that she tends to ignore her limits 02/10/21 INITIAL  3 Pt will be independent with long term HEP Baseline: will progress and establish as appropriate 02/10/21 INITIAL  4 Pt will return to regular exercise program to challenge cardiovascular health Baseline:avoiding at eval due to pulmonary limitations- only exercise is weekly visit to grocery store 02/10/21 INITIAL                  PLAN: PT FREQUENCY: 2x/week  PT DURATION: 8 weeks  PLANNED INTERVENTIONS: Therapeutic exercises, Therapeutic activity, Neuro Muscular re-education, Balance training, Gait training, Patient/Family education, Stair training, Aquatic Therapy, Dry Needling, Spinal mobilization, Cryotherapy, Moist heat, Taping, Traction, and Manual therapy  PLAN FOR NEXT SESSION: aquatics- strength: challenging periscap strength for upright posture, flexibility: anterior rib cage, cervical; challenging gross endurance with pulmonary limitations from asthma in mind  Gurnoor Sloop C. Verlean Allport PT, DPT 12/14/20 12:42 PM

## 2020-12-21 ENCOUNTER — Telehealth: Payer: Self-pay | Admitting: Internal Medicine

## 2020-12-21 MED ORDER — PREDNISONE 10 MG PO TABS
ORAL_TABLET | ORAL | 0 refills | Status: DC
Start: 2020-12-21 — End: 2021-06-02

## 2020-12-21 NOTE — Telephone Encounter (Signed)
I have called and spoke with pt and she stated that she was seen in a walk in clinic back in September.  She stated that they told her she had bronchitis/asthma.  She stated that they put her on pred taper  60 mg x 3 days, 40 mg x 3 days, 20 mg x 3 days, 10 mg x 3 days then stop.  She stated that when she stopped the prednisone she went back to how she was before.  She is coughing up thick sputum.  She is using the nebs 2 x daily and these do not seem to help.  She was wheezing and she is [facility] with any exertion.  I offered her appt but she stated that she does not drive and she has no one that can bring her to the office.  Pt is wanting to have something called in.  MW please advise. Thanks  Allergies  Allergen Reactions   Adenosine Anaphylaxis   Contrast Media [Iodinated Diagnostic Agents] Anaphylaxis   Codeine Sulfate Nausea And Vomiting   Latex Other (See Comments)    Skin irritation

## 2020-12-21 NOTE — Telephone Encounter (Signed)
I have called and spoke with pt and she is aware of MW recs.  She will start on the prednisone and she will call back to get her appt scheduled.

## 2020-12-21 NOTE — Telephone Encounter (Signed)
Prednisone 10 mg x 2 daily until better then stay on 1 until seen or total of 60pills then will def need ov at least televisit

## 2020-12-27 ENCOUNTER — Ambulatory Visit (HOSPITAL_BASED_OUTPATIENT_CLINIC_OR_DEPARTMENT_OTHER): Payer: Medicare Other | Admitting: Physical Therapy

## 2020-12-27 ENCOUNTER — Encounter (HOSPITAL_BASED_OUTPATIENT_CLINIC_OR_DEPARTMENT_OTHER): Payer: Medicare Other | Admitting: Physical Therapy

## 2020-12-27 ENCOUNTER — Other Ambulatory Visit: Payer: Self-pay

## 2020-12-27 ENCOUNTER — Encounter (HOSPITAL_BASED_OUTPATIENT_CLINIC_OR_DEPARTMENT_OTHER): Payer: Self-pay | Admitting: Physical Therapy

## 2020-12-27 DIAGNOSIS — R262 Difficulty in walking, not elsewhere classified: Secondary | ICD-10-CM

## 2020-12-27 DIAGNOSIS — R293 Abnormal posture: Secondary | ICD-10-CM | POA: Diagnosis not present

## 2020-12-27 DIAGNOSIS — Z8701 Personal history of pneumonia (recurrent): Secondary | ICD-10-CM | POA: Diagnosis not present

## 2020-12-27 DIAGNOSIS — J45909 Unspecified asthma, uncomplicated: Secondary | ICD-10-CM | POA: Diagnosis not present

## 2020-12-27 DIAGNOSIS — M353 Polymyalgia rheumatica: Secondary | ICD-10-CM | POA: Diagnosis not present

## 2020-12-27 DIAGNOSIS — Z853 Personal history of malignant neoplasm of breast: Secondary | ICD-10-CM | POA: Diagnosis not present

## 2020-12-27 NOTE — Therapy (Signed)
OUTPATIENT PHYSICAL THERAPY TREATMENT NOTE   Patient Name: Sonia [name] MRN: [id number] DOB:[birth year], 80 y.o., female Today's Date: 12/27/2020  PCP: [name], MD REFERRING PROVIDER: Glenis Smoker, MD   PT End of Session - 12/27/20 1532     Visit Number 2    Number of Visits 17    Date for PT Re-Evaluation 02/10/21    Authorization Type MCR    PT Start Time 1535    PT Stop Time 0350    PT Time Calculation (min) 40 min    Activity Tolerance Patient tolerated treatment well    Behavior During Therapy [facility] for tasks assessed/performed             Past Medical History:  Diagnosis Date   Arthritis    Asthma    daily and prn inhalers; states good control currently   CAD S/P percutaneous coronary angioplasty 07/15/2016   COPD (chronic obstructive pulmonary disease) ([place])    GERD (gastroesophageal reflux disease)    no current med.   GI bleed 07/25/2016   Brilinta changed to Plavix   Hashimoto's thyroiditis    Headache    allergies; silent migraines   Hiatal hernia    Hypothyroidism    Malignant melanoma ([place]) 08/20/2019   Left Posterior Neck (in situ) exc   Pneumonia    Sclerosing adenosis of left breast 03/2015   Vitamin D deficiency    Past Surgical History:  Procedure Laterality Date   ABDOMINAL HYSTERECTOMY     partial   APPENDECTOMY     BREAST BIOPSY Left    BREAST CYST EXCISION Left    BREAST EXCISIONAL BIOPSY Left 03/2015   BREAST LUMPECTOMY Right 08/2018   BREAST LUMPECTOMY WITH RADIOACTIVE SEED LOCALIZATION Left 04/07/2015   Procedure: BREAST LUMPECTOMY WITH RADIOACTIVE SEED LOCALIZATION;  Surgeon: Erroll Luna, MD;  Location: [place];  Service: General;  Laterality: Left;   BREAST LUMPECTOMY WITH RADIOACTIVE SEED LOCALIZATION Right 08/13/2018   Procedure: RIGHT BREAST LUMPECTOMY WITH RADIOACTIVE SEED LOCALIZATION;  Surgeon: Rolm Bookbinder, MD;  Location: [place];  Service: General;   Laterality: Right;   CORONARY STENT INTERVENTION N/A 07/15/2016   Procedure: Coronary Stent Intervention;  Surgeon: Sherren Mocha, MD;  Location: [place] [facility];  Service: Cardiovascular;  Laterality: N/A;   ESOPHAGOGASTRODUODENOSCOPY (EGD) WITH PROPOFOL Left 07/25/2016   Procedure: ESOPHAGOGASTRODUODENOSCOPY (EGD) WITH PROPOFOL;  Surgeon: Ronnette Juniper, MD;  Location: [place];  Service: Gastroenterology;  Laterality: Left;   KNEE ARTHROSCOPY Right 12/07/2003   LAPAROSCOPIC SALPINGO OOPHERECTOMY Right 09/08/2003   LEFT HEART CATH N/A 07/15/2016   Procedure: Left Heart Cath;  Surgeon: Sherren Mocha, MD;  Location: [place] [facility];  Service: Cardiovascular;  Laterality: N/A;   LEFT HEART CATH AND CORONARY ANGIOGRAPHY N/A 04/14/2019   Procedure: LEFT HEART CATH AND CORONARY ANGIOGRAPHY;  Surgeon: Burnell Blanks, MD;  Location: [place] [facility];  Service: Cardiovascular;  Laterality: N/A;   LYSIS OF ADHESION  09/08/2003   NASAL SINUS SURGERY  age 41   RECTAL POLYPECTOMY  10/02/2006   Patient Active Problem List   Diagnosis Date Noted   Coronary artery disease of native artery of native heart with stable angina pectoris (HCC)    Asthma, chronic, moderate persistent, uncomplicated 09/38/1829   DOE (dyspnea on exertion) 07/31/2018   Osteopenia 06/04/2018   Carcinoma of upper-outer quadrant of right breast in female, estrogen receptor positive ([place]) 05/30/2018   GI bleed 07/25/2016   Melena 07/24/2016  Acute renal insufficiency 07/24/2016   CAD S/P percutaneous coronary angioplasty 07/16/2016   NSTEMI (non-ST elevated myocardial infarction) ([place]) 07/13/2016   Severe asthma with acute exacerbation 03/07/2016   Genetic testing 06/09/2015   Family history of breast cancer in female 05/10/2015   History of melanoma 05/10/2015   Atypical lobular hyperplasia of left breast 04/26/2015   Vision problem 01/08/2014   Hiatus hernia syndrome 08/01/2012   Palpitations 10/18/2010   Acute  chest pain 10/18/2010   Hypothyroidism 10/18/2010   Dyslipidemia 06/07/2009   Upper airway cough syndrome 03/29/2009   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 I have not really left the house since my last appointment.   PAIN:  Are you having pain? No    OBJECTIVE:   COGNITION:          Overall cognitive status: Within functional limits for tasks assessed                        SENSATION:          Light touch: Appears intact             POSTURE:  Incr mid-thoracic kyphosis with forward head, rounded shoulders and collapse on ant rib cage, overuse of upper traps & cervical musculature for respiration   UE AROM/PROM:   WFL with good flexibility     UE MMT: gross   LE gross 5/5 in straight plane testing; UE gross 4/5     FUNCTIONAL TESTS:  5 times sit to stand: 10s 6 minute walk test: 682; stopped at 2 min (330 ft) took a 2 min rest break to catch breath           5TSTS did not incr breathing significantly         TODAY'S TREATMENT: 10/18  AquaticREHABdocumentation: Water will allow for work on balance using up thrust to improve posture. The principles of viscosity will help slow movement allowing for better processing time during fall recovery practice, Pt.requires the viscosity of the water for resistance with strengthening exercises, Hydrostatic pressure also supports joints by unweighting joint load by at least 50 % in 3-4 feet depth water. 80% in chest to neck deep water., and Water will allow for reduced gait deviation due to reduced joint loading through buoyancy to help patient improve posture without excess stress and pain Walking with water pushing arms back for chest expansion- multiple  throughout session, added breathing with stepping Wall sit with chest above water- double arm press dow, D1 extension bil with wrist floats, noodle double and single arm press downs, trunk rotation with floats in bil hands & single arm anchored to side of pool  Eval Hooklying transverse abdominis engagement to reduce rib cage flare- breathing focused to upper rib cage      PATIENT EDUCATION:  Education details: Anatomy of condition, POC, HEP, exercise form/rationale, aquatics   Person educated: Patient Education method: Consulting civil engineer, Demonstration, Tactile cues, Verbal cues, and Handouts Education comprehension: verbalized understanding, returned demonstration, verbal cues required, tactile cues required, and needs further education     HOME EXERCISE PROGRAM: [id number]   Stand/walk during TV commercials, if reading- set timer and walk every 45 min for 5-10 min  ASSESSMENT:   CLINICAL IMPRESSION: Pt really enjoyed the aquatic exercises today and tolerated well when using inhaler. Advised that she is doing more than she thinks she is when in the pool so we need to take it slowly.    REHAB POTENTIAL: Good   CLINICAL DECISION MAKING: Unstable/unpredictable   EVALUATION COMPLEXITY: High     GOALS: Goals reviewed with patient? Yes   SHORT TERM GOALS:   STG Name Target Date Goal status  1 Pt will verbalize notable decrease in necessary rest breaks when performing daily activities in her home Baseline: frequent and sits a lot at eval 01/06/21 INITIAL  2 Pt will demo ability to focus breathing through mid, anterior rib cage with anchor of lower rib cage flare Baseline: began educating at eval 01/06/21 INITIAL                                                 LONG TERM GOALS:    LTG Name Target Date Goal status  1 Pt will complete a 6MWT without rest break Baseline:1 standing rest break taken at eval that lasted 2 min 02/10/21 INITIAL  2 Pt will be able to recognize breathing  difficulty to take breaks at a moderate limitation to improve endurance Baseline: began discussing at eval- notes that she tends to ignore her limits 02/10/21 INITIAL  3 Pt will be independent with long term HEP Baseline: will progress and establish as appropriate 02/10/21 INITIAL  4 Pt will return to regular exercise program to challenge cardiovascular health Baseline:avoiding at eval due to pulmonary limitations- only exercise is weekly visit to grocery store 02/10/21 INITIAL                               PLAN: PT FREQUENCY: 2x/week   PT DURATION: 8 weeks   PLANNED INTERVENTIONS: Therapeutic exercises, Therapeutic activity, Neuro Muscular re-education, Balance training, Gait training, Patient/Family education, Stair training, Aquatic Therapy, Dry Needling, Spinal mobilization, Cryotherapy, Moist heat, Taping, Traction, and Manual therapy   PLAN FOR NEXT SESSION: aquatics- strength: challenging periscap strength for upright posture, flexibility: anterior rib cage, cervical; challenging gross endurance with pulmonary limitations from asthma in mind   Tal Kempker C. Bary Limbach PT, DPT 12/14/20 12:42 PM

## 2021-01-03 ENCOUNTER — Ambulatory Visit (HOSPITAL_BASED_OUTPATIENT_CLINIC_OR_DEPARTMENT_OTHER): Payer: Medicare Other | Admitting: Physical Therapy

## 2021-01-03 DIAGNOSIS — M545 Low back pain, unspecified: Secondary | ICD-10-CM | POA: Diagnosis not present

## 2021-01-03 DIAGNOSIS — R300 Dysuria: Secondary | ICD-10-CM | POA: Diagnosis not present

## 2021-01-10 ENCOUNTER — Ambulatory Visit (HOSPITAL_BASED_OUTPATIENT_CLINIC_OR_DEPARTMENT_OTHER): Payer: Medicare Other | Admitting: Physical Therapy

## 2021-01-12 ENCOUNTER — Ambulatory Visit: Payer: Medicare Other | Admitting: Internal Medicine

## 2021-01-12 ENCOUNTER — Ambulatory Visit (HOSPITAL_BASED_OUTPATIENT_CLINIC_OR_DEPARTMENT_OTHER): Payer: Medicare Other | Admitting: Physical Therapy

## 2021-01-13 ENCOUNTER — Telehealth: Payer: Self-pay | Admitting: Internal Medicine

## 2021-01-13 MED ORDER — PREDNISONE 10 MG PO TABS
ORAL_TABLET | ORAL | 0 refills | Status: DC
Start: 2021-01-13 — End: 2021-05-17

## 2021-01-13 NOTE — Telephone Encounter (Signed)
Patient is aware of MW's recommendations and voiced her understanding.  Prednisone sent to preferred pharmacy She stated that she does not take daily prednisone. Nothing further needed.   Routing back to MW as an Pharmacist, hospital.

## 2021-01-13 NOTE — Telephone Encounter (Signed)
Spoke to patient. Patient reports of sob with exertion with exertion and at rest, wheezing, dry cough at times prod with white sticky sputum and headache x2wk Denied f/c/s. She is using albuterol HFA Q4H, pulmicort BID,  She can not take perforomist due to reaction.  Currently being tx with keflex for bladder infection. Not vaccinated against covid and flu. No recent covid test.  She does not monitor spo2.  She was prescribed prednisone to keep on hand. She has misplaced the bottle.    Dr. Melvyn Novas, please advise. thanks

## 2021-01-13 NOTE — Telephone Encounter (Signed)
Prednisone 10 mg take  4 each am x 2 days,   2 each am x 2 days,  1 each am x 2 days and then resume prior dose if still takes it daily )

## 2021-01-17 ENCOUNTER — Ambulatory Visit (HOSPITAL_BASED_OUTPATIENT_CLINIC_OR_DEPARTMENT_OTHER): Payer: Medicare Other | Admitting: Physical Therapy

## 2021-01-19 ENCOUNTER — Other Ambulatory Visit: Payer: Self-pay

## 2021-01-19 ENCOUNTER — Ambulatory Visit: Payer: Medicare Other | Admitting: Dermatology

## 2021-01-23 ENCOUNTER — Ambulatory Visit: Payer: Medicare Other | Admitting: Cardiovascular Disease

## 2021-01-23 NOTE — Progress Notes (Deleted)
Date:  01/23/2021   Chief Complaint:  Follow up- CAD  History of Present Illness: 80 yo female with history of CAD, COPD, GERD and hypothyroidism who is here today for follow up. She was admitted to [facility] May 2018 with c/o left arm pain and found to have a NSTEMI. Cardiac cath May 2018 with severe stenosis in the mid RCA treated with a drug eluting stent. There was a moderate stenosis in the mid LAD. Echo May 2018 with normal LV systolic function, mild valve disease. She was discharged on Brilinta and readmitted one week later with a GI bleed. EGD showed gastritis. She was changed from Brilinta to Plavix and bleeding resolved. She has not tolerated statins. She cancelled a stress test arranged in March 2019. She was found to have breast cancer and has undergone lumpectomy in 2020. She had left arm pain in early 2021. Cardiac cath 04/14/19 with non-obstructive disease in the LAD and Circumflex and patent stent in the RCA with minimal restenosis. LV systolic function was normal.   She is here today for follow up. The patient denies any chest pain, dyspnea, palpitations, lower extremity edema, orthopnea, PND, dizziness, near syncope or syncope.   Primary Care Physician: Glenis Smoker, MD  Past Medical History:  Diagnosis Date   Arthritis    Asthma    daily and prn inhalers; states good control currently   CAD S/P percutaneous coronary angioplasty 07/15/2016   COPD (chronic obstructive pulmonary disease) (HCC)    GERD (gastroesophageal reflux disease)    no current med.   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   Vitamin D deficiency     Past Surgical History:  Procedure Laterality Date   ABDOMINAL HYSTERECTOMY     partial    APPENDECTOMY     BREAST BIOPSY Left    BREAST CYST EXCISION Left    BREAST EXCISIONAL BIOPSY Left 03/2015   BREAST LUMPECTOMY Right 08/2018   BREAST LUMPECTOMY WITH RADIOACTIVE SEED LOCALIZATION Left 04/07/2015   Procedure: BREAST LUMPECTOMY WITH RADIOACTIVE SEED LOCALIZATION;  Surgeon: Erroll Luna, MD;  Location: [place];  Service: General;  Laterality: Left;   BREAST LUMPECTOMY WITH RADIOACTIVE SEED LOCALIZATION Right 08/13/2018   Procedure: RIGHT BREAST LUMPECTOMY WITH RADIOACTIVE SEED LOCALIZATION;  Surgeon: Rolm Bookbinder, MD;  Location: [place];  Service: General;  Laterality: Right;   CORONARY STENT INTERVENTION N/A 07/15/2016   Procedure: Coronary Stent Intervention;  Surgeon: Sherren Mocha, MD;  Location: [place] [facility];  Service: Cardiovascular;  Laterality: N/A;   ESOPHAGOGASTRODUODENOSCOPY (EGD) WITH PROPOFOL Left 07/25/2016   Procedure: ESOPHAGOGASTRODUODENOSCOPY (EGD) WITH PROPOFOL;  Surgeon: Ronnette Juniper, MD;  Location: [place];  Service: Gastroenterology;  Laterality: Left;   KNEE ARTHROSCOPY Right 12/07/2003   LAPAROSCOPIC SALPINGO OOPHERECTOMY Right 09/08/2003   LEFT HEART CATH N/A 07/15/2016   Procedure: Left Heart Cath;  Surgeon: Sherren Mocha, MD;  Location: [place] [facility];  Service: Cardiovascular;  Laterality: N/A;   LEFT HEART CATH AND CORONARY ANGIOGRAPHY N/A 04/14/2019   Procedure: LEFT HEART CATH AND CORONARY ANGIOGRAPHY;  Surgeon: Burnell Blanks, MD;  Location: [place] [facility];  Service: Cardiovascular;  Laterality:  N/A;   LYSIS OF ADHESION  09/08/2003   NASAL SINUS SURGERY  age 16   RECTAL POLYPECTOMY  10/02/2006    Current Outpatient Medications  Medication Sig Dispense Refill   albuterol (PROVENTIL) (2.5 MG/3ML) 0.083% nebulizer solution Take 3 mLs (2.5 mg total) by nebulization every 6 (six) hours as needed for wheezing or shortness of breath. (Patient not taking: Reported on 10/11/2020) 360 mL 11    albuterol (VENTOLIN HFA) 108 (90 Base) MCG/ACT inhaler INHALE 2 PUFFS BY MOUTH EVERY 6 HOURS AS NEEDED FOR WHEEZING OR SHORTNESS OF BREATH 27 g 2   Budeson-Glycopyrrol-Formoterol (BREZTRI AEROSPHERE) 160-9-4.8 MCG/ACT AERO Inhale 2 puffs into the lungs 2 (two) times daily.     budesonide (PULMICORT) 0.5 MG/2ML nebulizer solution Take 2 mLs (0.5 mg total) by nebulization in the morning and at bedtime. 120 mL 5   calcium carbonate (TUMS - DOSED IN MG ELEMENTAL CALCIUM) 500 MG chewable tablet Chew 500 mg by mouth daily as needed for indigestion or heartburn.     cetirizine (ZYRTEC) 10 MG tablet Take 10 mg by mouth daily.     Cholecalciferol 25 MCG (1000 UT) capsule Take 10,000 Units by mouth 3 (three) times a week.     cromolyn (NASALCROM) 5.2 MG/ACT nasal spray Place 2 sprays into both nostrils daily as needed for allergies.     Cyanocobalamin (B-12) 5000 MCG CAPS Take 5,000 mcg by mouth every Monday, Wednesday, and Friday.     levothyroxine (SYNTHROID) 112 MCG tablet Synthroid 112 mcg tablet  TAKE 1 TABLET BY MOUTH ONCE DAILY IN THE MORNING ON AN EMPTY STOMACH     nitroGLYCERIN (NITROSTAT) 0.4 MG SL tablet Place 0.4 mg under the tongue every 5 (five) minutes as needed for chest pain.     predniSONE (DELTASONE) 10 MG tablet Take 2 tablets daily until feeling better then take 1 tablet daily 60 tablet 0   predniSONE (DELTASONE) 10 MG tablet 4tabX2d, 2tabsX2d, 1tabX2d 14 tablet 0   rosuvastatin (CRESTOR) 5 MG tablet 1 tablet     tretinoin (RETIN-A) 0.1 % cream Apply topically at bedtime.     No current facility-administered medications for this visit.    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  Tobacco Use   Smoking status: Never   Smokeless tobacco: Never  Scientific laboratory technician Use: Never used  Substance and Sexual Activity   Alcohol use: Not Currently    Comment: rarely   Drug use: No   Sexual activity: Not on file  Other Topics Concern   Not on file  Social History Narrative   Not on file   Social Determinants of Health   Financial Resource Strain: Not on file  Food Insecurity: Not on file  Transportation Needs: Not on file  Physical Activity: Not on file  Stress: Not on file  Social Connections: Not on file  Intimate Partner Violence: Not on file    Family History  Problem Relation Age of Onset  Asthma Mother    Heart disease Mother    Asthma Sister    Heart disease Sister    Breast cancer Sister 88   Heart disease Father    Kidney disease Father    Prostate cancer Brother        dx. 72s   Heart attack Brother 23   Alzheimer's disease Brother 4   Breast cancer Maternal Aunt 70   Stomach cancer Maternal Grandfather 42   Asthma Sister    Memory loss Sister    Heart attack Son 65   Spinal muscular atrophy Grandchild        type II   Cancer Other 52       niece dx. ca of salivary gland    Breast cancer Other 53       niece; negative genetic testing 10 years ago   Melanoma Other    Alzheimer's disease Maternal Aunt    Cervical cancer Cousin        maternal 1st cousin dx. at 49   Breast cancer Cousin 24       maternal 1st cousin   Lung cancer Cousin 41       maternal 1st cousin; smoker   Heart disease Cousin    Leukemia Cousin 14       paternal 1st cousin   Breast cancer Other        paternal great grandmother (PGF's mother) dx. late 70s-early 59s   Coronary artery disease Other     Review of Systems:  As stated in the HPI and otherwise negative.   There were no vitals taken  for this visit. Physical Examination:  General: Well developed, well nourished, NAD  HEENT: OP clear, mucus membranes moist  SKIN: warm, dry. No rashes. Neuro: No focal deficits  Musculoskeletal: Muscle strength 5/5 all ext  Psychiatric: Mood and affect normal  Neck: No JVD, no carotid bruits, no thyromegaly, no lymphadenopathy.  Lungs:Clear bilaterally, no wheezes, rhonci, crackles Cardiovascular: Regular rate and rhythm. No murmurs, gallops or rubs. Abdomen:Soft. Bowel sounds present. Non-tender.  Extremities: No lower extremity edema. Pulses are 2 + in the bilateral DP/PT.  Echo 07/16/16: - Left ventricle: The cavity size was normal. Systolic function was   normal. The estimated ejection fraction was in the range of 60%   to 65%. Wall motion was normal; there were no regional wall   motion abnormalities. Features are consistent with a pseudonormal   left ventricular filling pattern, with concomitant abnormal   relaxation and increased filling pressure (grade 2 diastolic   dysfunction). Doppler parameters are consistent with high   ventricular filling pressure. - Mitral valve: Calcified annulus. There was mild regurgitation. - Tricuspid valve: There was mild regurgitation. - Pulmonic valve: There was trivial regurgitation. - Pulmonary arteries: PA peak pressure: 37 mm Hg (S).  Impressions:  - The right ventricular systolic pressure was increased consistent   with mild pulmonary hypertension.  EKG:  EKG is *** ordered today. The ekg ordered today demonstrates   Recent Labs: No results found for requested labs within last 8760 hours.   Lipid Panel    Component Value Date/Time   CHOL 174 07/26/2017 1001   TRIG 65 07/26/2017 1001   HDL 76 07/26/2017 1001   CHOLHDL 2.5 01/16/2017 1036   CHOLHDL 3.0 07/13/2016 2331   VLDL 12 07/13/2016 2331   LDLCALC 85 07/26/2017 1001     Wt Readings from Last 3 Encounters:  10/11/20 159 lb 9.6 oz (72.4 kg)  05/24/20 161 lb 9.6 oz (73.3  kg)  01/05/20 156 lb (70.8 kg)     Other studies Reviewed: Additional studies/ records that were reviewed today include: . Review of the above records demonstrates:   Assessment and Plan:   1. CAD with angina: She has no chest pain. She only tolerates low doses of statins. Continue ASA and statin  2. HLD: LDL ***.    Current medicines are reviewed at length with the patient today.  The patient does not have concerns regarding medicines.  The following changes have been made:  no change  Labs/ tests ordered today include:   No orders of the defined types were placed in this encounter.    Disposition:   FU with me in 12 months   Signed, [name], MD 01/23/2021 7:48 AM    [address], [place], [place]  [address] Phone: [phone]; Fax: [phone]

## 2021-01-24 ENCOUNTER — Encounter (HOSPITAL_BASED_OUTPATIENT_CLINIC_OR_DEPARTMENT_OTHER): Payer: Self-pay | Admitting: Physical Therapy

## 2021-01-24 ENCOUNTER — Ambulatory Visit (HOSPITAL_BASED_OUTPATIENT_CLINIC_OR_DEPARTMENT_OTHER): Payer: Medicare Other | Attending: Family Medicine | Admitting: Physical Therapy

## 2021-01-24 ENCOUNTER — Other Ambulatory Visit: Payer: Self-pay

## 2021-01-24 DIAGNOSIS — R262 Difficulty in walking, not elsewhere classified: Secondary | ICD-10-CM | POA: Diagnosis not present

## 2021-01-24 DIAGNOSIS — R293 Abnormal posture: Secondary | ICD-10-CM

## 2021-01-24 NOTE — Therapy (Signed)
OUTPATIENT PHYSICAL THERAPY TREATMENT NOTE   Patient Name: Sonia [name] MRN: [id number] DOB:01-31-1941, 80 y.o., female Today's Date: 01/24/2021  PCP: Glenis Smoker, MD REFERRING PROVIDER: Glenis Smoker, MD   PT End of Session - 01/24/21 1353     Visit Number 3    Number of Visits 17    Date for PT Re-Evaluation 02/10/21    Authorization Type MCR    PT Start Time 1300    PT Stop Time 1340    PT Time Calculation (min) 40 min    Activity Tolerance Patient tolerated treatment well    Behavior During Therapy [facility] for tasks assessed/performed              Past Medical History:  Diagnosis Date   Arthritis    Asthma    daily and prn inhalers; states good control currently   CAD S/P percutaneous coronary angioplasty 07/15/2016   COPD (chronic obstructive pulmonary disease) ([place])    GERD (gastroesophageal reflux disease)    no current med.   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   Vitamin D deficiency    Past Surgical History:  Procedure Laterality Date   ABDOMINAL HYSTERECTOMY     partial   APPENDECTOMY     BREAST BIOPSY Left    BREAST CYST EXCISION Left    BREAST EXCISIONAL BIOPSY Left 03/2015   BREAST LUMPECTOMY Right 08/2018   BREAST LUMPECTOMY WITH RADIOACTIVE SEED LOCALIZATION Left 04/07/2015   Procedure: BREAST LUMPECTOMY WITH RADIOACTIVE SEED LOCALIZATION;  Surgeon: Erroll Luna, MD;  Location: [place];  Service: General;  Laterality: Left;   BREAST LUMPECTOMY WITH RADIOACTIVE SEED LOCALIZATION Right 08/13/2018   Procedure: RIGHT BREAST LUMPECTOMY WITH RADIOACTIVE SEED LOCALIZATION;  Surgeon: Rolm Bookbinder, MD;  Location: [place];  Service:  General;  Laterality: Right;   CORONARY STENT INTERVENTION N/A 07/15/2016   Procedure: Coronary Stent Intervention;  Surgeon: Sherren Mocha, MD;  Location: [place] [facility];  Service: Cardiovascular;  Laterality: N/A;   ESOPHAGOGASTRODUODENOSCOPY (EGD) WITH PROPOFOL Left 07/25/2016   Procedure: ESOPHAGOGASTRODUODENOSCOPY (EGD) WITH PROPOFOL;  Surgeon: Ronnette Juniper, MD;  Location: [place];  Service: Gastroenterology;  Laterality: Left;   KNEE ARTHROSCOPY Right 12/07/2003   LAPAROSCOPIC SALPINGO OOPHERECTOMY Right 09/08/2003   LEFT HEART CATH N/A 07/15/2016   Procedure: Left Heart Cath;  Surgeon: Sherren Mocha, MD;  Location: [place] [facility];  Service: Cardiovascular;  Laterality: N/A;   LEFT HEART CATH AND CORONARY ANGIOGRAPHY N/A 04/14/2019   Procedure: LEFT HEART CATH AND CORONARY ANGIOGRAPHY;  Surgeon: Burnell Blanks, MD;  Location: [place] [facility];  Service: Cardiovascular;  Laterality: N/A;   LYSIS OF ADHESION  09/08/2003   NASAL SINUS SURGERY  age 69   RECTAL POLYPECTOMY  10/02/2006   Patient Active Problem List   Diagnosis Date Noted   Coronary artery disease of native artery of native heart with stable angina pectoris (HCC)    Asthma, chronic, moderate persistent, uncomplicated 72/62/0355   DOE (dyspnea on exertion) 07/31/2018   Osteopenia 06/04/2018   Carcinoma of upper-outer quadrant of right breast in female, estrogen receptor positive ([place]) 05/30/2018   GI bleed 07/25/2016   [place] 07/24/2016  Acute renal insufficiency 07/24/2016   CAD S/P percutaneous coronary angioplasty 07/16/2016   NSTEMI (non-ST elevated myocardial infarction) ([place]) 07/13/2016   Severe asthma with acute exacerbation 03/07/2016   Genetic testing 06/09/2015   Family history of breast cancer in female 05/10/2015   History of melanoma 05/10/2015   Atypical lobular hyperplasia of left breast 04/26/2015   Vision problem 01/08/2014   Hiatus hernia syndrome 08/01/2012   Palpitations 10/18/2010    Acute chest pain 10/18/2010   Hypothyroidism 10/18/2010   Dyslipidemia 06/07/2009   Upper airway cough syndrome 03/29/2009   COPD with asthma ([place]) 12/05/2006   GERD 12/05/2006    REFERRING DIAG: R26.89 (ICD-10-CM) - Other abnormalities of gait and mobility   THERAPY DIAG:  Difficulty in walking, not elsewhere classified  Abnormal posture  PERTINENT HISTORY: asthma  PRECAUTIONS: none  SUBJECTIVE: Pt states she had a UTI since last session. She states no adverse reactions to pool therapy. Pt states she has not been to do the HEP.   PAIN:  Are you having pain? No  OBJECTIVE:    (EVAL) FUNCTIONAL TESTS:  5 times sit to stand: 10s 6 minute walk test: 682; stopped at 2 min (330 ft) took a 2 min rest break to catch breath           5TSTS did not incr breathing significantly     TODAY'S TREATMENT:  HR <120 throughout session Spo2 >/= 95% throughout  11/15  Recumbent bike intervals 30s on 1 min off- 5x Farmer's carry 10lbs each hand 13ft laps 4x (seated rest breaks between sets in order to cover to 97% SPo2 and 105 HR )   10/18  AquaticREHABdocumentation: Water will allow for work on balance using up thrust to improve posture. The principles of viscosity will help slow movement allowing for better processing time during fall recovery practice, Pt.requires the viscosity of the water for resistance with strengthening exercises, Hydrostatic pressure also supports joints by unweighting joint load by at least 50 % in 3-4 feet depth water. 80% in chest to neck deep water., and Water will allow for reduced gait deviation due to reduced joint loading through buoyancy to help patient improve posture without excess stress and pain Walking with water pushing arms back for chest expansion- multiple throughout session, added breathing with stepping Wall sit with chest above water- double arm press dow, D1 extension bil with wrist floats, noodle double and single arm press downs, trunk  rotation with floats in bil hands & single arm anchored to side of pool  Eval Hooklying transverse abdominis engagement to reduce rib cage flare- breathing focused to upper rib cage      PATIENT EDUCATION:  Education details: postural effect on breathing anatomy, exercise progression, DOMS expectations, muscle firing,  envelope of function, HEP Person educated: Patient Education method: Explanation, Demonstration, Tactile cues, Verbal cues, and Handouts Education comprehension: verbalized understanding, returned demonstration, verbal cues required, tactile cues required, and needs further education     HOME EXERCISE PROGRAM: [id number]   Walking program PDF provided via email    ASSESSMENT:   CLINICAL IMPRESSION: Pt with good tolerance to functional endurance training during today's session. Pt vitals monitored throughout session and required seated rest breaks in order to return to baseline HR and saturation. Pt required VC and TC for postural strength during session in order to promote better diaphragmatic expansion. Plan to continue with endurance intervals or increasing to circuits for promotion of improved functional capacity. Pt would benefit from continued skilled therapy  in order to reach goals and maximize functional endurance, strength, and ROM for prevention of further functional decline.     REHAB POTENTIAL: Good   CLINICAL DECISION MAKING: Unstable/unpredictable   EVALUATION COMPLEXITY: High     GOALS: Goals reviewed with patient? Yes   SHORT TERM GOALS:   STG Name Target Date Goal status  1 Pt will verbalize notable decrease in necessary rest breaks when performing daily activities in her home Baseline: frequent and sits a lot at eval 01/06/21 INITIAL  2 Pt will demo ability to focus breathing through mid, anterior rib cage with anchor of lower rib cage flare Baseline: began educating at eval 01/06/21 INITIAL                                                  LONG TERM GOALS:    LTG Name Target Date Goal status  1 Pt will complete a 6MWT without rest break Baseline:1 standing rest break taken at eval that lasted 2 min 02/10/21 INITIAL  2 Pt will be able to recognize breathing difficulty to take breaks at a moderate limitation to improve endurance Baseline: began discussing at eval- notes that she tends to ignore her limits 02/10/21 INITIAL  3 Pt will be independent with long term HEP Baseline: will progress and establish as appropriate 02/10/21 INITIAL  4 Pt will return to regular exercise program to challenge cardiovascular health Baseline:avoiding at eval due to pulmonary limitations- only exercise is weekly visit to grocery store 02/10/21 INITIAL                               PLAN: PT FREQUENCY: 2x/week   PT DURATION: 8 weeks   PLANNED INTERVENTIONS: Therapeutic exercises, Therapeutic activity, Neuro Muscular re-education, Balance training, Gait training, Patient/Family education, Stair training, Aquatic Therapy, Dry Needling, Spinal mobilization, Cryotherapy, Moist heat, Taping, Traction, and Manual therapy   PLAN FOR NEXT SESSION: land based strength and endurance, intervals, circuits    Daleen Bo PT, DPT 01/24/21 1:58 PM

## 2021-01-26 ENCOUNTER — Ambulatory Visit (HOSPITAL_BASED_OUTPATIENT_CLINIC_OR_DEPARTMENT_OTHER): Payer: Medicare Other | Admitting: Physical Therapy

## 2021-01-31 ENCOUNTER — Ambulatory Visit (HOSPITAL_BASED_OUTPATIENT_CLINIC_OR_DEPARTMENT_OTHER): Payer: Medicare Other | Admitting: Physical Therapy

## 2021-02-01 ENCOUNTER — Other Ambulatory Visit: Payer: Self-pay | Admitting: Internal Medicine

## 2021-02-07 ENCOUNTER — Ambulatory Visit (HOSPITAL_BASED_OUTPATIENT_CLINIC_OR_DEPARTMENT_OTHER): Payer: Medicare Other | Admitting: Physical Therapy

## 2021-02-07 ENCOUNTER — Encounter (HOSPITAL_BASED_OUTPATIENT_CLINIC_OR_DEPARTMENT_OTHER): Payer: Self-pay | Admitting: Physical Therapy

## 2021-02-07 ENCOUNTER — Other Ambulatory Visit: Payer: Self-pay

## 2021-02-07 DIAGNOSIS — R293 Abnormal posture: Secondary | ICD-10-CM

## 2021-02-07 DIAGNOSIS — R262 Difficulty in walking, not elsewhere classified: Secondary | ICD-10-CM

## 2021-02-07 NOTE — Therapy (Signed)
OUTPATIENT PHYSICAL THERAPY TREATMENT NOTE   Patient Name: Sonia [name] MRN: [id number] DOB:19-Jul-1940, 80 y.o., female Today's Date: 02/07/2021  PCP: Glenis Smoker, MD REFERRING PROVIDER: Glenis Smoker, MD   PT End of Session - 02/07/21 1440     Visit Number 4    Number of Visits 17    Date for PT Re-Evaluation 02/10/21    Authorization Type MCR    PT Start Time 1432    PT Stop Time 1510    PT Time Calculation (min) 38 min    Activity Tolerance Patient tolerated treatment well    Behavior During Therapy [facility] for tasks assessed/performed               Past Medical History:  Diagnosis Date   Arthritis    Asthma    daily and prn inhalers; states good control currently   CAD S/P percutaneous coronary angioplasty 07/15/2016   COPD (chronic obstructive pulmonary disease) ([place])    GERD (gastroesophageal reflux disease)    no current med.   GI bleed 07/25/2016   Brilinta changed to Plavix   Hashimoto's thyroiditis    Headache    allergies; silent migraines   Hiatal hernia    Hypothyroidism    Malignant melanoma ([place]) 08/20/2019   Left Posterior Neck (in situ) exc   Pneumonia    Sclerosing adenosis of left breast 03/2015   Vitamin D deficiency    Past Surgical History:  Procedure Laterality Date   ABDOMINAL HYSTERECTOMY     partial   APPENDECTOMY     BREAST BIOPSY Left    BREAST CYST EXCISION Left    BREAST EXCISIONAL BIOPSY Left 03/2015   BREAST LUMPECTOMY Right 08/2018   BREAST LUMPECTOMY WITH RADIOACTIVE SEED LOCALIZATION Left 04/07/2015   Procedure: BREAST LUMPECTOMY WITH RADIOACTIVE SEED LOCALIZATION;  Surgeon: Erroll Luna, MD;  Location: [place];  Service: General;  Laterality: Left;   BREAST LUMPECTOMY WITH RADIOACTIVE SEED LOCALIZATION Right 08/13/2018   Procedure: RIGHT BREAST LUMPECTOMY WITH RADIOACTIVE SEED LOCALIZATION;  Surgeon: Rolm Bookbinder, MD;  Location: [place];  Service:  General;  Laterality: Right;   CORONARY STENT INTERVENTION N/A 07/15/2016   Procedure: Coronary Stent Intervention;  Surgeon: Sherren Mocha, MD;  Location: [place] [facility];  Service: Cardiovascular;  Laterality: N/A;   ESOPHAGOGASTRODUODENOSCOPY (EGD) WITH PROPOFOL Left 07/25/2016   Procedure: ESOPHAGOGASTRODUODENOSCOPY (EGD) WITH PROPOFOL;  Surgeon: Ronnette Juniper, MD;  Location: [place];  Service: Gastroenterology;  Laterality: Left;   KNEE ARTHROSCOPY Right 12/07/2003   LAPAROSCOPIC SALPINGO OOPHERECTOMY Right 09/08/2003   LEFT HEART CATH N/A 07/15/2016   Procedure: Left Heart Cath;  Surgeon: Sherren Mocha, MD;  Location: [place] [facility];  Service: Cardiovascular;  Laterality: N/A;   LEFT HEART CATH AND CORONARY ANGIOGRAPHY N/A 04/14/2019   Procedure: LEFT HEART CATH AND CORONARY ANGIOGRAPHY;  Surgeon: Burnell Blanks, MD;  Location: [place] [facility];  Service: Cardiovascular;  Laterality: N/A;   LYSIS OF ADHESION  09/08/2003   NASAL SINUS SURGERY  age 3   RECTAL POLYPECTOMY  10/02/2006   Patient Active Problem List   Diagnosis Date Noted   Coronary artery disease of native artery of native heart with stable angina pectoris (HCC)    Asthma, chronic, moderate persistent, uncomplicated 78/93/8101   DOE (dyspnea on exertion) 07/31/2018   Osteopenia 06/04/2018   Carcinoma of upper-outer quadrant of right breast in female, estrogen receptor positive ([place]) 05/30/2018   GI bleed 07/25/2016   Melena  07/24/2016   Acute renal insufficiency 07/24/2016   CAD S/P percutaneous coronary angioplasty 07/16/2016   NSTEMI (non-ST elevated myocardial infarction) ([place]) 07/13/2016   Severe asthma with acute exacerbation 03/07/2016   Genetic testing 06/09/2015   Family history of breast cancer in female 05/10/2015   History of melanoma 05/10/2015   Atypical lobular hyperplasia of left breast 04/26/2015   Vision problem 01/08/2014   Hiatus hernia syndrome 08/01/2012   Palpitations 10/18/2010    Acute chest pain 10/18/2010   Hypothyroidism 10/18/2010   Dyslipidemia 06/07/2009   Upper airway cough syndrome 03/29/2009   COPD with asthma ([place]) 12/05/2006   GERD 12/05/2006    REFERRING DIAG: R26.89 (ICD-10-CM) - Other abnormalities of gait and mobility   THERAPY DIAG:  Difficulty in walking, not elsewhere classified  Abnormal posture  PERTINENT HISTORY: asthma  PRECAUTIONS: none  SUBJECTIVE: Pt states she has been trying to walk more regularly and has increased her walking daily. She states she was very sore after last session but it only lasted for 1-2 days.   PAIN:  Are you having pain? No  OBJECTIVE:    (EVAL) FUNCTIONAL TESTS:  5 times sit to stand: 10s 6 minute walk test: 682; stopped at 2 min (330 ft) took a 2 min rest break to catch breath           5TSTS did not incr breathing significantly     TODAY'S TREATMENT:  HR <115 throughout session Spo2 >/= 95% throughout  11/29  Recumbent bike intervals 1 min on 1 min off- 5x Farmer's carry 7-8 lbs each hand 38ft laps 4x (seated rest breaks between sets in order to cover to 97% SPo2 and 110 HR ) 7lbs rowing upright 3x10 rounds STS 5x3  11/15  Recumbent bike intervals 30s on 1 min off- 5x Farmer's carry 10lbs each hand 90ft laps 4x (seated rest breaks between sets in order to cover to 97% SPo2 and 105 HR )   10/18  AquaticREHABdocumentation: Water will allow for work on balance using up thrust to improve posture. The principles of viscosity will help slow movement allowing for better processing time during fall recovery practice, Pt.requires the viscosity of the water for resistance with strengthening exercises, Hydrostatic pressure also supports joints by unweighting joint load by at least 50 % in 3-4 feet depth water. 80% in chest to neck deep water., and Water will allow for reduced gait deviation due to reduced joint loading through buoyancy to help patient improve posture without excess stress and  pain Walking with water pushing arms back for chest expansion- multiple throughout session, added breathing with stepping Wall sit with chest above water- double arm press dow, D1 extension bil with wrist floats, noodle double and single arm press downs, trunk rotation with floats in bil hands & single arm anchored to side of pool  Eval Hooklying transverse abdominis engagement to reduce rib cage flare- breathing focused to upper rib cage      PATIENT EDUCATION:  Education details: postural effect on breathing anatomy, exercise progression, DOMS expectations, muscle firing,  envelope of function, HEP Person educated: Patient Education method: Explanation, Demonstration, Tactile cues, Verbal cues, and Handouts Education comprehension: verbalized understanding, returned demonstration, verbal cues required, tactile cues required, and needs further education     HOME EXERCISE PROGRAM: [email or web address]   Walking program PDF provided via email    ASSESSMENT:   CLINICAL IMPRESSION: Pt with good tolerance to increased circuit repetitions as well as variety of exercise today. Pt required  increased seated and standing breaks in order to maintain O2 sat and HR goal of 70-80%. Pt did require use of inhaler with first circuit but did not complain of dizziness or show signs of diaphoresis. Pt O2 sat 95% and above throughout session. Pt to continue with functional capacity training as tolerated. Plan include more LE based strength at next session. Pt would benefit from continued skilled therapy in order to reach goals and maximize functional endurance, strength, and ROM for prevention of further functional decline.     REHAB POTENTIAL: Good   CLINICAL DECISION MAKING: Unstable/unpredictable   EVALUATION COMPLEXITY: High     GOALS: Goals reviewed with patient? Yes   SHORT TERM GOALS:   STG Name Target Date Goal status  1 Pt will verbalize notable decrease in necessary rest breaks when performing daily  activities in her home Baseline: frequent and sits a lot at eval 01/06/21 INITIAL  2 Pt will demo ability to focus breathing through mid, anterior rib cage with anchor of lower rib cage flare Baseline: began educating at eval 01/06/21 INITIAL                                                 LONG TERM GOALS:    LTG Name Target Date Goal status  1 Pt will complete a 6MWT without rest break Baseline:1 standing rest break taken at eval that lasted 2 min 02/10/21 INITIAL  2 Pt will be able to recognize breathing difficulty to take breaks at a moderate limitation to improve endurance Baseline: began discussing at eval- notes that she tends to ignore her limits 02/10/21 INITIAL  3 Pt will be independent with long term HEP Baseline: will progress and establish as appropriate 02/10/21 INITIAL  4 Pt will return to regular exercise program to challenge cardiovascular health Baseline:avoiding at eval due to pulmonary limitations- only exercise is weekly visit to grocery store 02/10/21 INITIAL                               PLAN: PT FREQUENCY: 2x/week   PT DURATION: 8 weeks   PLANNED INTERVENTIONS: Therapeutic exercises, Therapeutic activity, Neuro Muscular re-education, Balance training, Gait training, Patient/Family education, Stair training, Aquatic Therapy, Dry Needling, Spinal mobilization, Cryotherapy, Moist heat, Taping, Traction, and Manual therapy   PLAN FOR NEXT SESSION: land based strength and endurance, intervals, circuits    Daleen Bo PT, DPT 02/07/21 3:17 PM

## 2021-02-08 ENCOUNTER — Telehealth: Payer: Self-pay | Admitting: Internal Medicine

## 2021-02-08 NOTE — Telephone Encounter (Signed)
MW please advise.  Thanks.  

## 2021-02-09 ENCOUNTER — Ambulatory Visit (HOSPITAL_BASED_OUTPATIENT_CLINIC_OR_DEPARTMENT_OTHER): Payer: Medicare Other | Admitting: Physical Therapy

## 2021-02-09 NOTE — Telephone Encounter (Signed)
We can do general allergy screen but insurance may not pay for the specific test she wants and I usually send mold cases to allergy ([place] [facility]) if the question needs to be resolved with high degree of certainty.

## 2021-02-10 NOTE — Telephone Encounter (Signed)
LMTCB

## 2021-02-11 DIAGNOSIS — J45901 Unspecified asthma with (acute) exacerbation: Secondary | ICD-10-CM | POA: Diagnosis not present

## 2021-02-11 DIAGNOSIS — R051 Acute cough: Secondary | ICD-10-CM | POA: Diagnosis not present

## 2021-02-14 ENCOUNTER — Ambulatory Visit (HOSPITAL_BASED_OUTPATIENT_CLINIC_OR_DEPARTMENT_OTHER): Payer: Medicare Other | Attending: Family Medicine | Admitting: Physical Therapy

## 2021-02-14 ENCOUNTER — Encounter (HOSPITAL_BASED_OUTPATIENT_CLINIC_OR_DEPARTMENT_OTHER): Payer: Self-pay | Admitting: Physical Therapy

## 2021-02-14 ENCOUNTER — Other Ambulatory Visit: Payer: Self-pay | Admitting: Internal Medicine

## 2021-02-14 DIAGNOSIS — J449 Chronic obstructive pulmonary disease, unspecified: Secondary | ICD-10-CM

## 2021-02-14 DIAGNOSIS — R262 Difficulty in walking, not elsewhere classified: Secondary | ICD-10-CM

## 2021-02-14 DIAGNOSIS — R293 Abnormal posture: Secondary | ICD-10-CM

## 2021-02-14 NOTE — Therapy (Addendum)
OUTPATIENT PHYSICAL THERAPY D/C NOTE   Patient Name: Sonia [name] MRN: [id number] DOB:1940/03/24, 80 y.o., female Today's Date: 02/14/2021  PCP: Shon Hale, MD REFERRING PROVIDER: Shon Hale, MD   PT End of Session - 02/14/21 1459     Visit Number 5    Number of Visits 17    Date for PT Re-Evaluation 02/10/21    Authorization Type MCR    PT Start Time 1430    PT Stop Time 1455    PT Time Calculation (min) 25 min    Activity Tolerance Patient tolerated treatment well    Behavior During Therapy [facility] for tasks assessed/performed                Past Medical History:  Diagnosis Date   Arthritis    Asthma    daily and prn inhalers; states good control currently   CAD S/P percutaneous coronary angioplasty 07/15/2016   COPD (chronic obstructive pulmonary disease) (HCC)    GERD (gastroesophageal reflux disease)    no current med.   GI bleed 07/25/2016   Brilinta changed to Plavix   Hashimoto's thyroiditis    Headache    allergies; silent migraines   Hiatal hernia    Hypothyroidism    Malignant melanoma (HCC) 08/20/2019   Left Posterior Neck (in situ) exc   Pneumonia    Sclerosing adenosis of left breast 03/2015   Vitamin D deficiency    Past Surgical History:  Procedure Laterality Date   ABDOMINAL HYSTERECTOMY     partial   APPENDECTOMY     BREAST BIOPSY Left    BREAST CYST EXCISION Left    BREAST EXCISIONAL BIOPSY Left 03/2015   BREAST LUMPECTOMY Right 08/2018   BREAST LUMPECTOMY WITH RADIOACTIVE SEED LOCALIZATION Left 04/07/2015   Procedure: BREAST LUMPECTOMY WITH RADIOACTIVE SEED LOCALIZATION;  Surgeon: Harriette Bouillon, MD;  Location: [facility];  Service: General;  Laterality: Left;   BREAST LUMPECTOMY WITH RADIOACTIVE SEED LOCALIZATION Right 08/13/2018   Procedure: RIGHT BREAST LUMPECTOMY WITH RADIOACTIVE SEED LOCALIZATION;  Surgeon: Emelia Loron, MD;  [facility];  Service: General;   Laterality: Right;   CORONARY STENT INTERVENTION N/A 07/15/2016   Procedure: Coronary Stent Intervention;  Surgeon: Tonny Bollman, MD;  [facility]: [facility];  Service: Cardiovascular;  Laterality: N/A;   ESOPHAGOGASTRODUODENOSCOPY (EGD) WITH PROPOFOL Left 07/25/2016   Procedure: ESOPHAGOGASTRODUODENOSCOPY (EGD) WITH PROPOFOL;  Surgeon: Kerin Salen, MD;  Location: [facility];  Service: Gastroenterology;  Laterality: Left;   KNEE ARTHROSCOPY Right 12/07/2003   LAPAROSCOPIC SALPINGO OOPHERECTOMY Right 09/08/2003   LEFT HEART CATH N/A 07/15/2016   Procedure: Left Heart Cath;  Surgeon: Tonny Bollman, MD;  Location: [facility] INVASIVE [facility];  Service: Cardiovascular;  Laterality: N/A;   LEFT HEART CATH AND CORONARY ANGIOGRAPHY N/A 04/14/2019   Procedure: LEFT HEART CATH AND CORONARY ANGIOGRAPHY;  Surgeon: Kathleene Hazel, MD;  Location: [facility];  Service: Cardiovascular;  Laterality: N/A;   LYSIS OF ADHESION  09/08/2003   NASAL SINUS SURGERY  age 38   RECTAL POLYPECTOMY  10/02/2006   Patient Active Problem List   Diagnosis Date Noted   Coronary artery disease of native artery of native heart with stable angina pectoris (HCC)    Asthma, chronic, moderate persistent, uncomplicated 02/03/2019   DOE (dyspnea on exertion) 07/31/2018   Osteopenia 06/04/2018   Carcinoma of upper-outer quadrant of right breast in female, estrogen receptor positive (HCC) 05/30/2018   GI bleed 07/25/2016  Melena 07/24/2016   Acute renal insufficiency 07/24/2016   CAD S/P percutaneous coronary angioplasty 07/16/2016   NSTEMI (non-ST elevated myocardial infarction) ([place]) 07/13/2016   Severe asthma with acute exacerbation 03/07/2016   Genetic testing 06/09/2015   Family history of breast cancer in female 05/10/2015   History of melanoma 05/10/2015   Atypical lobular hyperplasia of left breast 04/26/2015   Vision problem 01/08/2014   Hiatus hernia syndrome 08/01/2012   Palpitations 10/18/2010   Acute  chest pain 10/18/2010   Hypothyroidism 10/18/2010   Dyslipidemia 06/07/2009   Upper airway cough syndrome 03/29/2009   COPD with asthma ([place]) 12/05/2006   GERD 12/05/2006    REFERRING DIAG: R26.89 (ICD-10-CM) - Other abnormalities of gait and mobility   THERAPY DIAG:  Difficulty in walking, not elsewhere classified  Abnormal posture  PERTINENT HISTORY: asthma  PRECAUTIONS: none  SUBJECTIVE: Pt states she feels shaky today. She states she has been having some gastric issues recently with diarrhea for the last 3 days. Pt denies exposures or other illnesses.. Last time she was not able to come in was due to unable to stand from a swelling/circulation change. She states that she had bilateral LE edema down to both ankles. She states has L sided rib pain that is not effected by shoulder positioning or touch.   PAIN:  Are you having pain? No  OBJECTIVE:   98/76 BP in seated L arm 106 HR 96% O2 Sat     TODAY'S TREATMENT:   Bilateral LE swelling noted upon exam, moderate edema down to ankles; non pitting during session  No treatment rendered today given pt report of recent symptoms.          PATIENT EDUCATION:  Education details: MD referral, exercise progression, DOMS expectations, muscle firing,  envelope of function, HEP Person educated: Patient Education method: Explanation, Demonstration, Tactile cues, Verbal cues, and Handouts Education comprehension: verbalized understanding, returned demonstration, verbal cues required, tactile cues required, and needs further education     HOME EXERCISE PROGRAM: [email or web address]   Walking program PDF provided via email    ASSESSMENT:   CLINICAL IMPRESSION: No treatment rendered this session given recent report of LE edema, dyspnea, bilateral LE edema, and non MSK chest pain. Pt given edu about referral back to see MD/cardiologist in order to rule out red flag issues. Pt PT case placed on hold for next 30 days. PT will be contacting  PCP to get pt in sooner rather than later. Pt would benefit from continued skilled therapy in order to reach goals and maximize functional endurance, strength, and ROM for prevention of further functional decline.     REHAB POTENTIAL: Good   CLINICAL DECISION MAKING: Unstable/unpredictable   EVALUATION COMPLEXITY: High     GOALS: Goals reviewed with patient? Yes   SHORT TERM GOALS:   STG Name Target Date Goal status  1 Pt will verbalize notable decrease in necessary rest breaks when performing daily activities in her home Baseline: frequent and sits a lot at eval 01/06/21 INITIAL  2 Pt will demo ability to focus breathing through mid, anterior rib cage with anchor of lower rib cage flare Baseline: began educating at eval 01/06/21 INITIAL                                                 LONG TERM GOALS:    LTG  Name Target Date Goal status  1 Pt will complete a 6MWT without rest break Baseline:1 standing rest break taken at eval that lasted 2 min 02/10/21 INITIAL  2 Pt will be able to recognize breathing difficulty to take breaks at a moderate limitation to improve endurance Baseline: began discussing at eval- notes that she tends to ignore her limits 02/10/21 INITIAL  3 Pt will be independent with long term HEP Baseline: will progress and establish as appropriate 02/10/21 INITIAL  4 Pt will return to regular exercise program to challenge cardiovascular health Baseline:avoiding at eval due to pulmonary limitations- only exercise is weekly visit to grocery store 02/10/21 INITIAL                               PLAN: PT FREQUENCY: 2x/week   PT DURATION: 8 weeks   PLANNED INTERVENTIONS: Therapeutic exercises, Therapeutic activity, Neuro Muscular re-education, Balance training, Gait training, Patient/Family education, Stair training, Aquatic Therapy, Dry Needling, Spinal mobilization, Cryotherapy, Moist heat, Taping, Traction, and Manual therapy   PLAN FOR NEXT SESSION: general  conditioning when able to return   Daleen Bo PT, DPT 02/14/21 3:09 PM     PHYSICAL THERAPY DISCHARGE SUMMARY  Visits from Start of Care: 5  Plan: Patient agrees to discharge.  Patient goals were not met. Patient is being discharged due to not returning to therapy after 30 days.

## 2021-02-15 ENCOUNTER — Other Ambulatory Visit: Payer: Self-pay

## 2021-02-16 ENCOUNTER — Ambulatory Visit (HOSPITAL_BASED_OUTPATIENT_CLINIC_OR_DEPARTMENT_OTHER): Payer: Medicare Other | Admitting: Physical Therapy

## 2021-02-20 DIAGNOSIS — E038 Other specified hypothyroidism: Secondary | ICD-10-CM | POA: Diagnosis not present

## 2021-02-21 ENCOUNTER — Ambulatory Visit (HOSPITAL_BASED_OUTPATIENT_CLINIC_OR_DEPARTMENT_OTHER): Payer: Medicare Other | Admitting: Physical Therapy

## 2021-02-23 ENCOUNTER — Encounter (HOSPITAL_BASED_OUTPATIENT_CLINIC_OR_DEPARTMENT_OTHER): Payer: Medicare Other | Admitting: Physical Therapy

## 2021-02-28 ENCOUNTER — Encounter (HOSPITAL_BASED_OUTPATIENT_CLINIC_OR_DEPARTMENT_OTHER): Payer: Medicare Other | Admitting: Physical Therapy

## 2021-03-02 NOTE — Telephone Encounter (Signed)
ATC pt x 2. Line would ring then go silent with no VM, then disconnect.

## 2021-03-07 ENCOUNTER — Encounter (HOSPITAL_BASED_OUTPATIENT_CLINIC_OR_DEPARTMENT_OTHER): Payer: Medicare Other | Admitting: Physical Therapy

## 2021-03-09 ENCOUNTER — Ambulatory Visit: Payer: Medicare Other | Admitting: Cardiovascular Disease

## 2021-03-14 ENCOUNTER — Ambulatory Visit: Payer: Medicare Other | Admitting: Internal Medicine

## 2021-03-22 ENCOUNTER — Telehealth: Payer: Self-pay | Admitting: Internal Medicine

## 2021-03-22 NOTE — Telephone Encounter (Signed)
I am not finding any documentation where anyone at our office tried to call pt. Called and spoke with pt letting her know this and she verbalized understanding. Nothing further needed.

## 2021-04-07 ENCOUNTER — Telehealth: Payer: Self-pay | Admitting: Internal Medicine

## 2021-04-07 NOTE — Telephone Encounter (Signed)
Called and spoke with patient who states that her neighbor tested positive for Covid 19. Patient states not having symptoms at this time other than some allergies. States that she is just recovering from RSV. Advised patient to isolate for the next couple days and to also wait about 3-5 days to see if she starts to show symptoms and if she does then to take a covid test and call us if the results are positive. She expressed understanding. Nothing further needed at this time.

## 2021-04-10 ENCOUNTER — Telehealth: Payer: Self-pay | Admitting: Internal Medicine

## 2021-04-10 NOTE — Telephone Encounter (Signed)
Called pt back x 5 and someone picks up and hangs up the phone.

## 2021-04-10 NOTE — Telephone Encounter (Signed)
Patient is aware of recommendations and voiced her understanding. She will contact PCP.  Nothing further needed.

## 2021-04-10 NOTE — Telephone Encounter (Signed)
Spoke to patient.  Patient tested positive for covid on 04/08/2021. She reports of headache, neck ache, nausea, nasal drainage and sore throat. Sx started 04/07/2021. She also reports of small lump in right calf. Area is sore to the touch. No edema or redness. Sob is baseline. Using albuterol BID, symbicort BID and nasal rinses.  She is not vaccinated against covid due to allergies.  Dr. Melvyn Novas, please advise. Thanks

## 2021-04-10 NOTE — Telephone Encounter (Signed)
I called patient to get more information and she answered the phone and then hangs up 2 times. I need more information to send this to the provider. Will try again later.

## 2021-04-10 NOTE — Telephone Encounter (Signed)
She probably needs paxlovid  and I  note most of her symptoms are not respiratory so should go to the nearest [facility] for rx as they will need to get kidney function before treatment - could check also with PCP for rx if has recent labs there.

## 2021-04-12 ENCOUNTER — Encounter (HOSPITAL_BASED_OUTPATIENT_CLINIC_OR_DEPARTMENT_OTHER): Payer: Medicare Other | Admitting: Physical Therapy

## 2021-04-18 ENCOUNTER — Ambulatory Visit: Payer: Medicare Other | Admitting: Internal Medicine

## 2021-04-18 NOTE — Progress Notes (Deleted)
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 Asthma flare Chief Complaint  Patient presents with   Acute Visit    Pt c/o cough, wheezing and increased SOB x 2 wks. She is coughing up clear sputum.   prev w/u showed bad reflux  maint at baseline symb 160 2 bid and no proair  02/24/16 called in 7 days of worse cough/ wheeze/sob> rx medrol not clear she took it Exp to gg child sick prior to onset of cough rec Take 4 for three days 3 for three days 2 for three days 1 for three days and stop  Start Pantoprazole (protonix) 40 mg   Take  30-60 min before first meal of the day and Pepcid (famotidine)  20 mg one @  bedtime until return to office - this is the best way to tell whether stomach acid is contributing to your problem.      07/31/2018 acute extended ov/Sonia [name] re:  Worse sob since   Nov 2018  Now on omeprazole 20mg  60 min ac  Early march 2020 worse sob despite symbicort 160 2 bid   No cough Sleeping on wedge  Room to room at home  rec Try omeprazole 20 mg x 2 x 30-60 min before your first meal Symbicort 160 Take 2 puffs first thing in am and then another 2 puffs about 12 hours later.  Only use your albuterol as a rescue medication  Please schedule a follow up visit in 3 months but call sooner if needed  with all medications /inhalers/ solutions in hand so we can verify exactly what you are taking. This includes all medications from all doctors and over the counters   11/05/2018   f/u ov/Sonia [name] re: chronic asthma on ppi 20 mg qd and symb 160 2bid / no meds in hand as req Chief Complaint  Patient presents with   Follow-up    Pt states her breathing has been some worse with the humid weather. She has used her albuterol inhaler 2 x since the last visit.   Dyspnea:  Aerobics/ swimming now again but doesn't do well out in heat/ humidity  Cough: no  Sleeping: no resp issues   SABA use: min hfa/ no neb 02: no rec Work on inhaler technique:   Please schedule a follow up visit in 6 months but call sooner if needed  with all medications /inhalers/ solutions in hand so we can verify exactly what you are taking. This includes all medications from all doctors and over the counters     02/03/2019  f/u ov/Sonia [name] re: chronic asthma, worse since September 2020 with overt gerd  Chief Complaint  Patient presents with   Follow-up    Breathing is okay today. She is using her albuterol inhaler 1-2 x per wk on average.   Dyspnea:  Fine sitting still but doe across the room on symb 160 2bid and did not prev respond to singulair  Cough:  worse bending over / non productive, on prilosec 20 mg ac qd  With overt HB Sleeping: no resp symptoms  SABA use: 1-2x weekly when overdoes it, never pre-challenges / has neb but no meds for it x years. 02: none  rec  GERD   Stop symbicort and start Breztri Take 2 puffs first thing in am and then another 2 puffs about 12 hours later. Only use your albuterol as a rescue medication   Discuss with Dr Cristina Gong re optimal options to treat your reflux   Dx April 2021  PMR and placed on placed on prednisone   01/05/2020  f/u ov/Sonia [name] re: ? ACOS  symbicort off ppi only taking tums now on symb 160 and pred 15 mg daily  Chief Complaint  Patient presents with   Follow-up    pt c/o increased sob with exertion, states this is d/t ragweed allergy.    Dyspnea:  Worse off breztri ?  / on pred for PMR   Cough :  None  Sleeping: 2 pillows / bed is flat  SABA use:  not sure it helps /never prechallenges as rec  02: none  rec Plan A = Automatic = Always=    Breztri Take 2 puffs first thing in am and then another 2 puffs about 12 hours later x 2 weeks trial > if not better resume the symbicort. Work on inhaler technique:  Plan B = Backup (to supplement plan A, not to replace it) Only use your albuterol inhaler as a rescue medication Plan C = Crisis (instead of Plan B but only if Plan B stops working) - only use your albuterol nebulizer if you first try Plan B   05/24/2020  f/u ov/Sonia [name] re: ACOS / large Joiner with GERD/Buccini Chief Complaint  Patient presents with   Follow-up    Increased SOB "since the allergies are out". She rarely uses her albuterol inhaler or neb.  Dyspnea:  Not sure ? HT a little easier but new bladder symptoms on Breztri  Cough: none, does not blow breztri out thru nose Sleeping: 2 pillow/ bed flat  SABA use: rarely use  02: not Covid status:   Not vaccinated  Rec  I very strongly recommend you get the moderna or pfizer vaccine   Plan A = Automatic = Always=   Symbicort 160 Take 2 puffs first thing in am and then another 2 puffs about 12 hours later.  Work on Orthoptist B = Backup (to supplement plan A, not to replace it) Only use your albuterol inhaler as a rescue medication  Plan C = Crisis (instead of Plan B but only if Plan B stops working) - only use your albuterol nebulizer if you first try Plan B and it fails to help > ok to use the nebulizer up to every 4 hours but if start needing it regularly call for immediate appointment    10/11/2020  f/u ov/Sonia [name] re: ACOS/ large HH back on breztri   prednisone 2-1-2mg  alternating for pmr / changed symbicort to breztri and worse, better back on breztri  Chief Complaint  Patient presents with   Follow-up    Shortness of breath    Dyspnea:  can do ht early in am  Cough: assoc with pnds to point of gagging  /no recent abx Sleeping: bed is 10 degrees with bed blocks plus  sev  pillows  SABA use: hfa twice daily / doest not have functioning neb as per AVS recs ("forgot to tell  you it wasn't working/ I thought you knew that and had ordered it" - record indicates this was not the case at all - see phone note 09/14/20)  02: none Covid status:   never vax   Rec We need to be sure you have the nebulizer  Plan A = Automatic = Always=    breztri  (or symbicort 160) Take 2 puffs first thing in am and then another 2 puffs about 12 hours later.   Plan B = Backup (to supplement plan A, not to replace it) Only use your albuterol inhaler as a rescue medication  Plan C = Crisis (instead of Plan B but only if Plan B stops working) - only use your albuterol nebulizer if you first try Plan B and it fails to help   04/18/2021  f/u ov/Sonia [name] re: ***   maint on ***  No chief complaint on file.   Dyspnea:  *** Cough: *** Sleeping: *** SABA use: *** 02: *** Covid status:   ***   No obvious day to day or daytime variability or assoc excess/ purulent sputum or mucus plugs or hemoptysis or cp or chest tightness, subjective wheeze or overt sinus or hb symptoms.   *** without nocturnal  or early am exacerbation  of respiratory  c/o's or need for noct saba. Also denies any obvious fluctuation of symptoms with weather or environmental changes or other aggravating or alleviating factors except as outlined above   No unusual exposure hx or h/o childhood pna/ asthma or knowledge of premature birth.  Current Allergies, Complete Past Medical History, Past Surgical History, Family History, and Social History were reviewed in [employer] record.  ROS  The following are not active complaints unless bolded Hoarseness, sore throat, dysphagia, dental problems, itching, sneezing,  nasal congestion or discharge of excess mucus or purulent secretions, ear ache,   fever, chills, sweats, unintended wt loss or wt gain, classically pleuritic or exertional cp,  orthopnea pnd or  arm/hand swelling  or leg swelling, presyncope, palpitations, abdominal pain, anorexia, nausea, vomiting, diarrhea  or change in bowel habits or change in bladder habits, change in stools or change in urine, dysuria, hematuria,  rash, arthralgias, visual complaints, headache, numbness, weakness or ataxia or problems with walking or coordination,  change in mood or  memory.        No outpatient medications have been marked as taking for the 04/18/21 encounter (Appointment) with Tanda Rockers, MD.                Physical Exam:  Wts  04/18/2021           ***  10/11/2020          159   05/24/2020       161 01/05/2020     156  05/12/2019         152  02/03/2019     153 11/05/2018       152  07/31/2018       154   03/07/16 160 lb 12.8 oz (72.9 kg)  01/04/16 160 lb (72.6 kg)  04/26/15 159 lb 4.8 oz (72.3 kg)     Vital signs reviewed  04/18/2021  - Note at rest 02 sats  ***% on ***   General appearance:    ***   ,  Mild barr ***

## 2021-04-19 ENCOUNTER — Encounter (HOSPITAL_BASED_OUTPATIENT_CLINIC_OR_DEPARTMENT_OTHER): Payer: Medicare Other | Admitting: Physical Therapy

## 2021-04-24 ENCOUNTER — Other Ambulatory Visit: Payer: Self-pay | Admitting: Internal Medicine

## 2021-04-25 ENCOUNTER — Encounter (HOSPITAL_BASED_OUTPATIENT_CLINIC_OR_DEPARTMENT_OTHER): Payer: Self-pay | Admitting: Physical Therapy

## 2021-04-25 ENCOUNTER — Ambulatory Visit (HOSPITAL_BASED_OUTPATIENT_CLINIC_OR_DEPARTMENT_OTHER): Payer: Medicare Other | Attending: Family Medicine | Admitting: Physical Therapy

## 2021-04-25 ENCOUNTER — Other Ambulatory Visit: Payer: Self-pay

## 2021-04-25 DIAGNOSIS — R262 Difficulty in walking, not elsewhere classified: Secondary | ICD-10-CM | POA: Insufficient documentation

## 2021-04-25 DIAGNOSIS — R293 Abnormal posture: Secondary | ICD-10-CM | POA: Diagnosis not present

## 2021-04-25 NOTE — Therapy (Addendum)
OUTPATIENT PHYSICAL THERAPY LOWER EXTREMITY EVALUATION   Patient Name: Sonia [name] MRN: [id number] DOB:29-Apr-1940, 81 y.o., female Today's Date: 04/25/2021   PT End of Session - 04/25/21 1351     Visit Number 1    Number of Visits 25    Date for PT Re-Evaluation 07/24/21    Authorization Type MCR    PT Start Time 1347    PT Stop Time 1430    PT Time Calculation (min) 43 min    Activity Tolerance Patient tolerated treatment well    Behavior During Therapy [facility] for tasks assessed/performed              Past Medical History:  Diagnosis Date   Arthritis    Asthma    daily and prn inhalers; states good control currently   CAD S/P percutaneous coronary angioplasty 07/15/2016   COPD (chronic obstructive pulmonary disease) (HCC)    GERD (gastroesophageal reflux disease)    no current med.   GI bleed 07/25/2016   Brilinta changed to Plavix   Hashimoto's thyroiditis    Headache    allergies; silent migraines   Hiatal hernia    Hypothyroidism    Malignant melanoma ([place]) 08/20/2019   Left Posterior Neck (in situ) exc   Pneumonia    Sclerosing adenosis of left breast 03/2015   Vitamin D deficiency    Past Surgical History:  Procedure Laterality Date   ABDOMINAL HYSTERECTOMY     partial   APPENDECTOMY     BREAST BIOPSY Left    BREAST CYST EXCISION Left    BREAST EXCISIONAL BIOPSY Left 03/2015   BREAST LUMPECTOMY Right 08/2018   BREAST LUMPECTOMY WITH RADIOACTIVE SEED LOCALIZATION Left 04/07/2015   Procedure: BREAST LUMPECTOMY WITH RADIOACTIVE SEED LOCALIZATION;  Surgeon: Erroll Luna, MD;  Location: [place];  Service: General;  Laterality: Left;   BREAST LUMPECTOMY WITH RADIOACTIVE SEED LOCALIZATION Right 08/13/2018   Procedure: RIGHT BREAST LUMPECTOMY WITH RADIOACTIVE SEED LOCALIZATION;  Surgeon: Rolm Bookbinder, MD;  Location: [place];  Service: General;  Laterality: Right;   CORONARY STENT INTERVENTION N/A 07/15/2016    Procedure: Coronary Stent Intervention;  Surgeon: Sherren Mocha, MD;  Location: [place] [facility];  Service: Cardiovascular;  Laterality: N/A;   ESOPHAGOGASTRODUODENOSCOPY (EGD) WITH PROPOFOL Left 07/25/2016   Procedure: ESOPHAGOGASTRODUODENOSCOPY (EGD) WITH PROPOFOL;  Surgeon: Ronnette Juniper, MD;  Location: [place];  Service: Gastroenterology;  Laterality: Left;   KNEE ARTHROSCOPY Right 12/07/2003   LAPAROSCOPIC SALPINGO OOPHERECTOMY Right 09/08/2003   LEFT HEART CATH N/A 07/15/2016   Procedure: Left Heart Cath;  Surgeon: Sherren Mocha, MD;  Location: [place] [facility];  Service: Cardiovascular;  Laterality: N/A;   LEFT HEART CATH AND CORONARY ANGIOGRAPHY N/A 04/14/2019   Procedure: LEFT HEART CATH AND CORONARY ANGIOGRAPHY;  Surgeon: Burnell Blanks, MD;  Location: [place] [facility];  Service: Cardiovascular;  Laterality: N/A;   LYSIS OF ADHESION  09/08/2003   NASAL SINUS SURGERY  age 29   RECTAL POLYPECTOMY  10/02/2006   Patient Active Problem List   Diagnosis Date Noted   Coronary artery disease of native artery of native heart with stable angina pectoris (HCC)    Asthma, chronic, moderate persistent, uncomplicated 78/58/8502   DOE (dyspnea on exertion) 07/31/2018   Osteopenia 06/04/2018   Carcinoma of upper-outer quadrant of right breast in female, estrogen receptor positive ([place]) 05/30/2018   GI bleed 07/25/2016   Melena 07/24/2016   Acute renal insufficiency 07/24/2016   CAD S/P percutaneous  coronary angioplasty 07/16/2016   NSTEMI (non-ST elevated myocardial infarction) ([place]) 07/13/2016   Severe asthma with acute exacerbation 03/07/2016   Genetic testing 06/09/2015   Family history of breast cancer in female 05/10/2015   History of melanoma 05/10/2015   Atypical lobular hyperplasia of left breast 04/26/2015   Vision problem 01/08/2014   Hiatus hernia syndrome 08/01/2012   Palpitations 10/18/2010   Acute chest pain 10/18/2010   Hypothyroidism 10/18/2010   Dyslipidemia  06/07/2009   Upper airway cough syndrome 03/29/2009   COPD with asthma ([place]) 12/05/2006   GERD 12/05/2006    PCP: Glenis Smoker, MD  REFERRING PROVIDER: Glenis Smoker, MD   REFERRING DIAG: R26.89 (ICD-10-CM) - Other abnormalities of gait and mobility   THERAPY DIAG:  Difficulty in walking, not elsewhere classified - Plan: PT plan of care cert/re-cert  Abnormal posture - Plan: PT plan of care cert/re-cert  ONSET DATE: about 2 years ago- when COVID shut down began  SUBJECTIVE:   SUBJECTIVE STATEMENT: Pt states she recently got RSV while in [place] and then COVID-19. She states she is having significant fatigue with activity and continues to be tired with most activity. She has limited endurance and feels she is unable to perform extended periods of activity. Pt reports she has lost weight recently due to illness as well as decreased intake of food. She does feel better with less weight on her body. Pt states she is still mainly housebound following COVID-19 and does not really walk any significant distance.   PERTINENT HISTORY: asthma PAIN:  Are you having pain? No   PRECAUTIONS: None  WEIGHT BEARING RESTRICTIONS No  FALLS:  Has patient fallen in last 6 months? No  LIVING ENVIRONMENT: Lives with: lives with their family, dog Lives in: House/apartment Stairs: No;    OCCUPATION: retired Marine scientist  PLOF: Independent  PATIENT GOALS: Improve endurance and would like to be able to walk more, be able to do more daily functions/chores without being tired   OBJECTIVE:  COGNITION:  Overall cognitive status: Within functional limits for tasks assessed     SENSATION:  Light touch: Appears intact    POSTURE:   kyphoticwith forward head, rounded shoulders   UE AROM/PROM:   WFL with OH and BHB reaching  LE MMT: 4+/5 throughout bilat hips and knees   FUNCTIONAL TESTS:  5 times sit to stand: 13s 6 minute walk test: 1083ft  no breaks needed (97% O2  saturation)   5TSTS did not incr breathing significantly  TODAY'S TREATMENT:  Exercises Sit to Stand with Arms Crossed - 1 x daily - 7 x weekly - 3 sets - 10 reps Standing March with Counter Support - 1 x daily - 7 x weekly - 2 sets - 20 reps Supine Bridge - 1 x daily - 7 x weekly - 3 sets - 10 reps    PATIENT EDUCATION:  Education details: MOI, diagnosis, prognosis, anatomy, exercise progression, DOMS expectations, muscle firing,  envelope of function, HEP, POC Person educated: Patient Education method: Explanation, Demonstration, Tactile cues, Verbal cues, and Handouts Education comprehension: verbalized understanding, returned demonstration, verbal cues required, tactile cues required, and needs further education   HOME EXERCISE PROGRAM: [id number]   ASSESSMENT:  CLINICAL IMPRESSION: Patient is a 81 y.o. F who was seen today for physical therapy evaluation and treatment for cc of gait and mobility deficits. Pt's limitations are largely due to to cardiopulmonary deficits and general deconditioning.   Objective impairments include decreased activity tolerance, decreased endurance, difficulty  walking, decreased strength, increased muscle spasms, impaired flexibility, and postural dysfunction. These impairments are limiting patient from cleaning, community activity, and lives independently- has difficulty with endurance to complete ADLs . Personal factors including Fitness, Time since onset of injury/illness/exacerbation, and 3+ comorbidities: h/o breast CA, pneumonia, asthma  are also affecting patient's functional outcome. Patient will benefit from skilled PT to address above impairments and improve overall function. We stood and talked in the aquatics facility to ensure that pt would tolerate temp and humidity and she verbalized readiness for aquatic treatments.   REHAB POTENTIAL: Good  CLINICAL DECISION MAKING: Unstable/unpredictable  EVALUATION COMPLEXITY: High   GOALS: Goals  reviewed with patient? Yes  SHORT TERM GOALS:  STG Name Target Date Goal status  1 Pt will become independent with HEP in order to demonstrate synthesis of PT education.  06/06/2021  INITIAL   LONG TERM GOALS:   LTG Name Target Date Goal status  1 Pt  will become independent with final HEP in order to demonstrate synthesis of PT education.   07/18/2021  INITIAL  2 Pt will be able to perform 5XSTS in under 12s  in order to demonstrate functional improvement above the cut off score for adults.  07/18/2021 INITIAL  3 Pt will be able to walk at least 3280 ft or 1038m during 6MWT in order to demonstrate improvement in functional endurance and mobility for decrease in all cause mortality.   07/18/2021 INITIAL  4 Pt will be able to demonstrate/report ability to walk >___ mins without pain in order to demonstrate functional improvement and tolerance to exercise and community mobility.  07/18/2021 INITIAL   PLAN: PT FREQUENCY: 1-2x/week  PT DURATION: 12 wks (likely D/C by 8)  PLANNED INTERVENTIONS: Therapeutic exercises, Therapeutic activity, Neuro Muscular re-education, Balance training, Gait training, Patient/Family education, Stair training, Aquatic Therapy, Dry Needling, Spinal mobilization, Cryotherapy, Moist heat, Taping, Traction, and Manual therapy  PLAN FOR NEXT SESSION: continue with general conditioning, LE strength, and endurance; consider circuits  Daleen Bo PT, DPT 04/25/21 2:30 PM

## 2021-04-27 ENCOUNTER — Ambulatory Visit (HOSPITAL_BASED_OUTPATIENT_CLINIC_OR_DEPARTMENT_OTHER): Payer: Medicare Other | Admitting: Physical Therapy

## 2021-04-27 ENCOUNTER — Encounter (HOSPITAL_BASED_OUTPATIENT_CLINIC_OR_DEPARTMENT_OTHER): Payer: Self-pay | Admitting: Physical Therapy

## 2021-04-27 ENCOUNTER — Other Ambulatory Visit: Payer: Self-pay

## 2021-04-27 DIAGNOSIS — R262 Difficulty in walking, not elsewhere classified: Secondary | ICD-10-CM

## 2021-04-27 DIAGNOSIS — R293 Abnormal posture: Secondary | ICD-10-CM | POA: Diagnosis not present

## 2021-04-27 NOTE — Therapy (Signed)
OUTPATIENT PHYSICAL THERAPY LOWER EXTREMITY EVALUATION   Patient Name: Sonia [name] MRN: [id number] DOB:03-08-1941, 81 y.o., female Today's Date: 04/27/2021   PT End of Session - 04/27/21 1432     Visit Number 2    Number of Visits 25    Date for PT Re-Evaluation 07/24/21    Authorization Type MCR    PT Start Time 1430    PT Stop Time 1510    PT Time Calculation (min) 40 min    Activity Tolerance Patient tolerated treatment well    Behavior During Therapy [facility] for tasks assessed/performed               Past Medical History:  Diagnosis Date   Arthritis    Asthma    daily and prn inhalers; states good control currently   CAD S/P percutaneous coronary angioplasty 07/15/2016   COPD (chronic obstructive pulmonary disease) (HCC)    GERD (gastroesophageal reflux disease)    no current med.   GI bleed 07/25/2016   Brilinta changed to Plavix   Hashimoto's thyroiditis    Headache    allergies; silent migraines   Hiatal hernia    Hypothyroidism    Malignant melanoma ([place]) 08/20/2019   Left Posterior Neck (in situ) exc   Pneumonia    Sclerosing adenosis of left breast 03/2015   Vitamin D deficiency    Past Surgical History:  Procedure Laterality Date   ABDOMINAL HYSTERECTOMY     partial   APPENDECTOMY     BREAST BIOPSY Left    BREAST CYST EXCISION Left    BREAST EXCISIONAL BIOPSY Left 03/2015   BREAST LUMPECTOMY Right 08/2018   BREAST LUMPECTOMY WITH RADIOACTIVE SEED LOCALIZATION Left 04/07/2015   Procedure: BREAST LUMPECTOMY WITH RADIOACTIVE SEED LOCALIZATION;  Surgeon: Erroll Luna, MD;  Location: [place];  Service: General;  Laterality: Left;   BREAST LUMPECTOMY WITH RADIOACTIVE SEED LOCALIZATION Right 08/13/2018   Procedure: RIGHT BREAST LUMPECTOMY WITH RADIOACTIVE SEED LOCALIZATION;  Surgeon: Rolm Bookbinder, MD;  Location: [place];  Service: General;  Laterality: Right;   CORONARY STENT INTERVENTION N/A 07/15/2016    Procedure: Coronary Stent Intervention;  Surgeon: Sherren Mocha, MD;  Location: [place] [facility];  Service: Cardiovascular;  Laterality: N/A;   ESOPHAGOGASTRODUODENOSCOPY (EGD) WITH PROPOFOL Left 07/25/2016   Procedure: ESOPHAGOGASTRODUODENOSCOPY (EGD) WITH PROPOFOL;  Surgeon: Ronnette Juniper, MD;  Location: [place];  Service: Gastroenterology;  Laterality: Left;   KNEE ARTHROSCOPY Right 12/07/2003   LAPAROSCOPIC SALPINGO OOPHERECTOMY Right 09/08/2003   LEFT HEART CATH N/A 07/15/2016   Procedure: Left Heart Cath;  Surgeon: Sherren Mocha, MD;  Location: [place] [facility];  Service: Cardiovascular;  Laterality: N/A;   LEFT HEART CATH AND CORONARY ANGIOGRAPHY N/A 04/14/2019   Procedure: LEFT HEART CATH AND CORONARY ANGIOGRAPHY;  Surgeon: Burnell Blanks, MD;  Location: [place] [facility];  Service: Cardiovascular;  Laterality: N/A;   LYSIS OF ADHESION  09/08/2003   NASAL SINUS SURGERY  age 57   RECTAL POLYPECTOMY  10/02/2006   Patient Active Problem List   Diagnosis Date Noted   Coronary artery disease of native artery of native heart with stable angina pectoris (HCC)    Asthma, chronic, moderate persistent, uncomplicated 79/89/2119   DOE (dyspnea on exertion) 07/31/2018   Osteopenia 06/04/2018   Carcinoma of upper-outer quadrant of right breast in female, estrogen receptor positive ([place]) 05/30/2018   GI bleed 07/25/2016   Melena 07/24/2016   Acute renal insufficiency 07/24/2016   CAD S/P  percutaneous coronary angioplasty 07/16/2016   NSTEMI (non-ST elevated myocardial infarction) ([place]) 07/13/2016   Severe asthma with acute exacerbation 03/07/2016   Genetic testing 06/09/2015   Family history of breast cancer in female 05/10/2015   History of melanoma 05/10/2015   Atypical lobular hyperplasia of left breast 04/26/2015   Vision problem 01/08/2014   Hiatus hernia syndrome 08/01/2012   Palpitations 10/18/2010   Acute chest pain 10/18/2010   Hypothyroidism 10/18/2010    Dyslipidemia 06/07/2009   Upper airway cough syndrome 03/29/2009   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 She was up all night getting up going to the bathroom. Today she feels better.   PERTINENT HISTORY: asthma PAIN:  Are you having pain? No   LIVING ENVIRONMENT: Lives with: lives with their family, dog Lives in: House/apartment Stairs: No;    OCCUPATION: retired Marine scientist  PLOF: Independent  PATIENT GOALS: Improve endurance and would like to be able to walk more, be able to do more daily functions/chores without being tired   OBJECTIVE:     FUNCTIONAL TESTS:  5 times sit to stand: 13s 6 minute walk test: 1034ft  no breaks needed (97% O2 saturation)   5TSTS did not incr breathing significantly  TODAY'S TREATMENT:  Recumbent bike intervals 1 min on/off 10 mins total. L1 Farmer's carry 10lb each hand 21ft 4x laps 30s breaks for first 3 laps Sit to Stand with Arms Crossed;  Standing March with Counter; Support Supine Bridge 3 cycles 10 each 1 min recovery on first, 2 min on 2nd,    PATIENT EDUCATION:  Education details:  anatomy, exercise progression, DOMS expectations, muscle firing,  envelope of function, HEP, POC Person educated: Patient Education method: Explanation, Demonstration, Tactile cues, Verbal cues, and Handouts Education comprehension: verbalized understanding, returned demonstration, verbal cues required, tactile cues required, and needs further education   HOME EXERCISE PROGRAM: [id number]   ASSESSMENT:  CLINICAL IMPRESSION: Pt able to  continue with progression of endurance at today's session. Pt able to perform LE strength as well and cardio endurance exercise today without significant SOB. Pt operated within 4-6 (moderate) Borg RPE scale during session with 8 RPE during last set of circuit. Plan to continue with interval and circuit training as tolerated. Pt with significant improvement in endurance and recovery as compared to pervious episode.   Objective impairments include decreased activity tolerance, decreased endurance, difficulty walking, decreased strength, increased muscle spasms, impaired flexibility, and postural dysfunction. These impairments are limiting patient from cleaning, community activity, and lives independently- has difficulty with endurance to complete ADLs . Personal factors including Fitness, Time since onset of injury/illness/exacerbation, and 3+ comorbidities: h/o breast CA, pneumonia, asthma  are also affecting patient's functional outcome. Patient will benefit from skilled PT to address above impairments and improve overall function. We stood and talked in the aquatics facility to ensure that pt would tolerate temp and humidity and she verbalized readiness for aquatic treatments.   REHAB POTENTIAL: Good  CLINICAL DECISION MAKING: Unstable/unpredictable  EVALUATION COMPLEXITY: High   GOALS:   SHORT TERM GOALS:  STG Name Target Date Goal  status  1 Pt will become independent with HEP in order to demonstrate synthesis of PT education.  06/08/2021  INITIAL   LONG TERM GOALS:   LTG Name Target Date Goal status  1 Pt  will become independent with final HEP in order to demonstrate synthesis of PT education.   07/20/2021  INITIAL  2 Pt will be able to perform 5XSTS in under 12s  in order to demonstrate functional improvement above the cut off score for adults.  07/18/2021 INITIAL  3 Pt will be able to walk at least 3280 ft or 1014m during 6MWT in order to demonstrate improvement in functional  endurance and mobility for decrease in all cause mortality.   07/18/2021 INITIAL  4 Pt will be able to demonstrate/report ability to walk >15 mins without pain in order to demonstrate functional improvement and tolerance to exercise and community mobility.  07/18/2021 INITIAL   PLAN: PT FREQUENCY: 1-2x/week  PT DURATION: 12 wks (likely D/C by 8)  PLANNED INTERVENTIONS: Therapeutic exercises, Therapeutic activity, Neuro Muscular re-education, Balance training, Gait training, Patient/Family education, Stair training, Aquatic Therapy, Dry Needling, Spinal mobilization, Cryotherapy, Moist heat, Taping, Traction, and Manual therapy  PLAN FOR NEXT SESSION: continue with general conditioning, endurance; consider  UE and LE strength circuits  Daleen Bo PT, DPT 04/27/21 3:12 PM

## 2021-04-28 DIAGNOSIS — E559 Vitamin D deficiency, unspecified: Secondary | ICD-10-CM | POA: Diagnosis not present

## 2021-04-28 DIAGNOSIS — R5383 Other fatigue: Secondary | ICD-10-CM | POA: Diagnosis not present

## 2021-04-28 DIAGNOSIS — R634 Abnormal weight loss: Secondary | ICD-10-CM | POA: Diagnosis not present

## 2021-05-02 ENCOUNTER — Other Ambulatory Visit: Payer: Self-pay

## 2021-05-02 ENCOUNTER — Ambulatory Visit (HOSPITAL_BASED_OUTPATIENT_CLINIC_OR_DEPARTMENT_OTHER): Payer: Medicare Other | Admitting: Physical Therapy

## 2021-05-02 ENCOUNTER — Encounter (HOSPITAL_BASED_OUTPATIENT_CLINIC_OR_DEPARTMENT_OTHER): Payer: Self-pay | Admitting: Physical Therapy

## 2021-05-02 DIAGNOSIS — R262 Difficulty in walking, not elsewhere classified: Secondary | ICD-10-CM

## 2021-05-02 DIAGNOSIS — R293 Abnormal posture: Secondary | ICD-10-CM | POA: Diagnosis not present

## 2021-05-02 NOTE — Therapy (Signed)
OUTPATIENT PHYSICAL THERAPY TREATMENT   Patient Name: Sonia [name] MRN: [id number] DOB:1941-02-09, 81 y.o., female Today's Date: 05/02/2021   PT End of Session - 05/02/21 1349     Visit Number 3    Number of Visits 25    Date for PT Re-Evaluation 07/24/21    Authorization Type MCR    PT Start Time 1345    PT Stop Time 1425    PT Time Calculation (min) 40 min    Activity Tolerance Patient tolerated treatment well    Behavior During Therapy [facility] for tasks assessed/performed                Past Medical History:  Diagnosis Date   Arthritis    Asthma    daily and prn inhalers; states good control currently   CAD S/P percutaneous coronary angioplasty 07/15/2016   COPD (chronic obstructive pulmonary disease) (HCC)    GERD (gastroesophageal reflux disease)    no current med.   GI bleed 07/25/2016   Brilinta changed to Plavix   Hashimoto's thyroiditis    Headache    allergies; silent migraines   Hiatal hernia    Hypothyroidism    Malignant melanoma ([place]) 08/20/2019   Left Posterior Neck (in situ) exc   Pneumonia    Sclerosing adenosis of left breast 03/2015   Vitamin D deficiency    Past Surgical History:  Procedure Laterality Date   ABDOMINAL HYSTERECTOMY     partial   APPENDECTOMY     BREAST BIOPSY Left    BREAST CYST EXCISION Left    BREAST EXCISIONAL BIOPSY Left 03/2015   BREAST LUMPECTOMY Right 08/2018   BREAST LUMPECTOMY WITH RADIOACTIVE SEED LOCALIZATION Left 04/07/2015   Procedure: BREAST LUMPECTOMY WITH RADIOACTIVE SEED LOCALIZATION;  Surgeon: Erroll Luna, MD;  Location: [place];  Service: General;  Laterality: Left;   BREAST LUMPECTOMY WITH RADIOACTIVE SEED LOCALIZATION Right 08/13/2018   Procedure: RIGHT BREAST LUMPECTOMY WITH RADIOACTIVE SEED LOCALIZATION;  Surgeon: Rolm Bookbinder, MD;  Location: [place];  Service: General;  Laterality: Right;   CORONARY STENT INTERVENTION N/A 07/15/2016   Procedure:  Coronary Stent Intervention;  Surgeon: Sherren Mocha, MD;  Location: [place] [facility];  Service: Cardiovascular;  Laterality: N/A;   ESOPHAGOGASTRODUODENOSCOPY (EGD) WITH PROPOFOL Left 07/25/2016   Procedure: ESOPHAGOGASTRODUODENOSCOPY (EGD) WITH PROPOFOL;  Surgeon: Ronnette Juniper, MD;  Location: [place];  Service: Gastroenterology;  Laterality: Left;   KNEE ARTHROSCOPY Right 12/07/2003   LAPAROSCOPIC SALPINGO OOPHERECTOMY Right 09/08/2003   LEFT HEART CATH N/A 07/15/2016   Procedure: Left Heart Cath;  Surgeon: Sherren Mocha, MD;  Location: [place] [facility];  Service: Cardiovascular;  Laterality: N/A;   LEFT HEART CATH AND CORONARY ANGIOGRAPHY N/A 04/14/2019   Procedure: LEFT HEART CATH AND CORONARY ANGIOGRAPHY;  Surgeon: Burnell Blanks, MD;  Location: [place] [facility];  Service: Cardiovascular;  Laterality: N/A;   LYSIS OF ADHESION  09/08/2003   NASAL SINUS SURGERY  age 59   RECTAL POLYPECTOMY  10/02/2006   Patient Active Problem List   Diagnosis Date Noted   Coronary artery disease of native artery of native heart with stable angina pectoris (HCC)    Asthma, chronic, moderate persistent, uncomplicated 22/04/5425   DOE (dyspnea on exertion) 07/31/2018   Osteopenia 06/04/2018   Carcinoma of upper-outer quadrant of right breast in female, estrogen receptor positive ([place]) 05/30/2018   GI bleed 07/25/2016   Melena 07/24/2016   Acute renal insufficiency 07/24/2016   CAD S/P percutaneous  coronary angioplasty 07/16/2016   NSTEMI (non-ST elevated myocardial infarction) ([place]) 07/13/2016   Severe asthma with acute exacerbation 03/07/2016   Genetic testing 06/09/2015   Family history of breast cancer in female 05/10/2015   History of melanoma 05/10/2015   Atypical lobular hyperplasia of left breast 04/26/2015   Vision problem 01/08/2014   Hiatus hernia syndrome 08/01/2012   Palpitations 10/18/2010   Acute chest pain 10/18/2010   Hypothyroidism 10/18/2010   Dyslipidemia 06/07/2009    Upper airway cough syndrome 03/29/2009   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  PERTINENT HISTORY: asthma PAIN:  Are you having pain? No   LIVING ENVIRONMENT: Lives with: lives with their family, dog Lives in: House/apartment Stairs: No;    OCCUPATION: retired Marine scientist  PLOF: Independent  PATIENT GOALS: Improve endurance and would like to be able to walk more, be able to do more daily functions/chores without being tired   OBJECTIVE:     FUNCTIONAL TESTS:  5 times sit to stand: 13s 6 minute walk test: 1031ft  no breaks needed (97% O2 saturation)   5TSTS did not incr breathing significantly  TODAY'S TREATMENT:  Recumbent bike intervals 1 min on/off 10 mins total. L1  Farmer's carry 10lb each hand 82ft 4x laps 30s breaks for first 3 laps  Horizontal ABD YTB 3x10  Sit to Stand with Arms Crossed;  wall push up; rowing 3lbs 3 cycles 10 each 1 min recovery times   HR under 125 throughout session O2 sat 95% throughout PATIENT EDUCATION:  Education details:  anatomy, exercise progression, DOMS expectations, muscle firing,  envelope of function, HEP, POC Person educated: Patient Education method: Explanation, Demonstration, Tactile cues, Verbal cues, and Handouts Education comprehension: verbalized understanding, returned demonstration, verbal cues required, tactile cues required, and needs further education   HOME EXERCISE PROGRAM: [id number]   ASSESSMENT:  CLINICAL IMPRESSION: Pt operated within 4-6 (moderate) Borg  RPE scale during session today with less increased rest break times. Pt O2 saturation above 95% throughout session. Pt had better response nad recovery time with UE circuit vs LE. Plan to alternate UE and LE focused circuits between sessions. Pt endurance does appear to be improving with duration of exercise.  Pt with significant improvement in endurance and recovery as compared to pervious episode.   Objective impairments include decreased activity tolerance, decreased endurance, difficulty walking, decreased strength, increased muscle spasms, impaired flexibility, and postural dysfunction. These impairments are limiting patient from cleaning, community activity, and lives independently- has difficulty with endurance to complete ADLs . Personal factors including Fitness, Time since onset of injury/illness/exacerbation, and 3+ comorbidities: h/o breast CA, pneumonia, asthma  are also affecting patient's functional outcome. Patient will benefit from skilled PT to address above impairments and improve overall function. We stood and talked in the aquatics facility to ensure that pt would tolerate temp and humidity and she verbalized readiness for aquatic treatments.   REHAB POTENTIAL: Good  CLINICAL DECISION MAKING: Unstable/unpredictable  EVALUATION COMPLEXITY: High   GOALS:   SHORT TERM GOALS:  STG Name Target Date Goal status  1 Pt will become independent with HEP in order to demonstrate synthesis of PT education.  06/13/2021  INITIAL   LONG TERM GOALS:   LTG Name Target Date Goal status  1 Pt  will become independent with final HEP in order to demonstrate synthesis of PT education.   07/25/2021  INITIAL  2 Pt will be able to perform 5XSTS in under 12s  in order to demonstrate functional improvement above the cut off score for adults.  07/18/2021 INITIAL  3 Pt will be able to walk at least 3280 ft or 1023m during 6MWT in order to demonstrate improvement in functional endurance and mobility  for decrease in all cause mortality.   07/18/2021 INITIAL  4 Pt will be able to demonstrate/report ability to walk >15 mins without pain in order to demonstrate functional improvement and tolerance to exercise and community mobility.  07/18/2021 INITIAL   PLAN: PT FREQUENCY: 1-2x/week  PT DURATION: 12 wks (likely D/C by 8)  PLANNED INTERVENTIONS: Therapeutic exercises, Therapeutic activity, Neuro Muscular re-education, Balance training, Gait training, Patient/Family education, Stair training, Aquatic Therapy, Dry Needling, Spinal mobilization, Cryotherapy, Moist heat, Taping, Traction, and Manual therapy  PLAN FOR NEXT SESSION: continue with general conditioning, endurance; UE and LE strength circuits alternate  Daleen Bo PT, DPT 05/02/21 2:28 PM

## 2021-05-04 ENCOUNTER — Other Ambulatory Visit: Payer: Self-pay

## 2021-05-04 ENCOUNTER — Ambulatory Visit (HOSPITAL_BASED_OUTPATIENT_CLINIC_OR_DEPARTMENT_OTHER): Payer: Medicare Other | Admitting: Physical Therapy

## 2021-05-04 ENCOUNTER — Encounter (HOSPITAL_BASED_OUTPATIENT_CLINIC_OR_DEPARTMENT_OTHER): Payer: Self-pay | Admitting: Physical Therapy

## 2021-05-04 DIAGNOSIS — R262 Difficulty in walking, not elsewhere classified: Secondary | ICD-10-CM

## 2021-05-04 DIAGNOSIS — R293 Abnormal posture: Secondary | ICD-10-CM

## 2021-05-04 NOTE — Therapy (Signed)
OUTPATIENT PHYSICAL THERAPY TREATMENT   Patient Name: Sonia [name] MRN: [id number] DOB:06/22/1940, 81 y.o., female Today's Date: 05/04/2021   PT End of Session - 05/04/21 1348     Visit Number 4    Number of Visits 25    Date for PT Re-Evaluation 07/24/21    Authorization Type MCR    PT Start Time 1345    PT Stop Time 1425    PT Time Calculation (min) 40 min    Activity Tolerance Patient tolerated treatment well    Behavior During Therapy [facility] for tasks assessed/performed                 Past Medical History:  Diagnosis Date   Arthritis    Asthma    daily and prn inhalers; states good control currently   CAD S/P percutaneous coronary angioplasty 07/15/2016   COPD (chronic obstructive pulmonary disease) (HCC)    GERD (gastroesophageal reflux disease)    no current med.   GI bleed 07/25/2016   Brilinta changed to Plavix   Hashimoto's thyroiditis    Headache    allergies; silent migraines   Hiatal hernia    Hypothyroidism    Malignant melanoma ([place]) 08/20/2019   Left Posterior Neck (in situ) exc   Pneumonia    Sclerosing adenosis of left breast 03/2015   Vitamin D deficiency    Past Surgical History:  Procedure Laterality Date   ABDOMINAL HYSTERECTOMY     partial   APPENDECTOMY     BREAST BIOPSY Left    BREAST CYST EXCISION Left    BREAST EXCISIONAL BIOPSY Left 03/2015   BREAST LUMPECTOMY Right 08/2018   BREAST LUMPECTOMY WITH RADIOACTIVE SEED LOCALIZATION Left 04/07/2015   Procedure: BREAST LUMPECTOMY WITH RADIOACTIVE SEED LOCALIZATION;  Surgeon: Erroll Luna, MD;  Location: [place];  Service: General;  Laterality: Left;   BREAST LUMPECTOMY WITH RADIOACTIVE SEED LOCALIZATION Right 08/13/2018   Procedure: RIGHT BREAST LUMPECTOMY WITH RADIOACTIVE SEED LOCALIZATION;  Surgeon: Rolm Bookbinder, MD;  Location: [place];  Service: General;  Laterality: Right;   CORONARY STENT INTERVENTION N/A 07/15/2016   Procedure:  Coronary Stent Intervention;  Surgeon: Sherren Mocha, MD;  Location: [place] [facility];  Service: Cardiovascular;  Laterality: N/A;   ESOPHAGOGASTRODUODENOSCOPY (EGD) WITH PROPOFOL Left 07/25/2016   Procedure: ESOPHAGOGASTRODUODENOSCOPY (EGD) WITH PROPOFOL;  Surgeon: Ronnette Juniper, MD;  Location: [place];  Service: Gastroenterology;  Laterality: Left;   KNEE ARTHROSCOPY Right 12/07/2003   LAPAROSCOPIC SALPINGO OOPHERECTOMY Right 09/08/2003   LEFT HEART CATH N/A 07/15/2016   Procedure: Left Heart Cath;  Surgeon: Sherren Mocha, MD;  Location: [place] [facility];  Service: Cardiovascular;  Laterality: N/A;   LEFT HEART CATH AND CORONARY ANGIOGRAPHY N/A 04/14/2019   Procedure: LEFT HEART CATH AND CORONARY ANGIOGRAPHY;  Surgeon: Burnell Blanks, MD;  Location: [place] [facility];  Service: Cardiovascular;  Laterality: N/A;   LYSIS OF ADHESION  09/08/2003   NASAL SINUS SURGERY  age 68   RECTAL POLYPECTOMY  10/02/2006   Patient Active Problem List   Diagnosis Date Noted   Coronary artery disease of native artery of native heart with stable angina pectoris (HCC)    Asthma, chronic, moderate persistent, uncomplicated 21/30/8657   DOE (dyspnea on exertion) 07/31/2018   Osteopenia 06/04/2018   Carcinoma of upper-outer quadrant of right breast in female, estrogen receptor positive ([place]) 05/30/2018   GI bleed 07/25/2016   Melena 07/24/2016   Acute renal insufficiency 07/24/2016   CAD S/P  percutaneous coronary angioplasty 07/16/2016   NSTEMI (non-ST elevated myocardial infarction) ([place]) 07/13/2016   Severe asthma with acute exacerbation 03/07/2016   Genetic testing 06/09/2015   Family history of breast cancer in female 05/10/2015   History of melanoma 05/10/2015   Atypical lobular hyperplasia of left breast 04/26/2015   Vision problem 01/08/2014   Hiatus hernia syndrome 08/01/2012   Palpitations 10/18/2010   Acute chest pain 10/18/2010   Hypothyroidism 10/18/2010   Dyslipidemia 06/07/2009    Upper airway cough syndrome 03/29/2009   COPD with asthma ([place]) 12/05/2006   GERD 12/05/2006    PCP: Glenis Smoker, MD  REFERRING PROVIDER: Glenis Smoker, MD   REFERRING DIAG: R26.89 (ICD-10-CM) - Other abnormalities of gait and mobility   THERAPY DIAG:  Difficulty in walking, not elsewhere classified  Abnormal posture  ONSET DATE: about 2 years ago- when COVID shut down began  SUBJECTIVE:   SUBJECTIVE STATEMENT: Pt states she is very tired today. She states that bc of her thyroid issue, she is consistently "off" and feels like she does not have energy.   PERTINENT HISTORY: asthma PAIN:  Are you having pain? No   LIVING ENVIRONMENT: Lives with: lives with their family, dog Lives in: House/apartment Stairs: No;    OCCUPATION: retired Marine scientist  PLOF: Independent  PATIENT GOALS: Improve endurance and would like to be able to walk more, be able to do more daily functions/chores without being tired   OBJECTIVE:     FUNCTIONAL TESTS:  Previous: 5 times sit to stand: 13s 6 minute walk test: 1053ft  no breaks needed (97% O2 saturation)   5TSTS did not incr breathing significantly  TODAY'S TREATMENT:  Recumbent bike intervals 1 min on/off 10 mins total. L1  59min rest breaks: Shuttle leg press 56 lbs 3x10 1 min rest breaks Sit to Stand YTB at knees 5lb KB 3x10 Blue TB rowing 3x10 UBE 34min retro and 2 min fwd  HR under 120 throughout session O2 sat 98% throughout  PATIENT EDUCATION:  Education details:  anatomy, exercise progression, DOMS expectations, muscle firing,  envelope of function, HEP, POC Person educated: Patient Education method: Explanation, Demonstration, Tactile cues, Verbal cues, and Handouts Education comprehension: verbalized understanding, returned demonstration, verbal cues required, tactile cues required, and needs further education   HOME EXERCISE PROGRAM: [id number]   ASSESSMENT:  CLINICAL IMPRESSION: Pt session  focused today on strengthening of both UE and LE at today's session with hypertrophy based parameters of rest breaks and volume. Pt able to maintain 98% O2 sat and under 120 BPM HR throughout session. Pt able to recover within 1 min between sets. Plan to continue with circuit style weight training for conditioning and endurance.   Objective impairments include decreased activity tolerance, decreased endurance, difficulty walking, decreased strength, increased muscle spasms, impaired flexibility, and postural dysfunction. These impairments are limiting patient from cleaning, community activity, and lives independently- has difficulty with endurance to complete ADLs . Personal factors including Fitness, Time since onset of injury/illness/exacerbation, and 3+ comorbidities: h/o breast CA, pneumonia, asthma  are also affecting patient's functional outcome. Patient will benefit from skilled PT to address above impairments and improve overall function. We stood and talked in the aquatics facility to ensure that pt would tolerate temp and humidity and she verbalized readiness for aquatic treatments.   REHAB POTENTIAL: Good  CLINICAL DECISION MAKING: Unstable/unpredictable  EVALUATION COMPLEXITY: High   GOALS:   SHORT TERM GOALS:  STG Name Target Date Goal status  1 Pt will become  independent with HEP in order to demonstrate synthesis of PT education.  06/15/2021  INITIAL   LONG TERM GOALS:   LTG Name Target Date Goal status  1 Pt  will become independent with final HEP in order to demonstrate synthesis of PT education.   07/27/2021  INITIAL  2 Pt will be able to perform 5XSTS in under 12s  in order to demonstrate functional improvement above the cut off score for adults.  07/18/2021 INITIAL  3 Pt will be able to walk at least 3280 ft or 1033m during 6MWT in order to demonstrate improvement in functional endurance and mobility for decrease in all cause mortality.   07/18/2021 INITIAL  4 Pt will be  able to demonstrate/report ability to walk >15 mins without pain in order to demonstrate functional improvement and tolerance to exercise and community mobility.  07/18/2021 INITIAL   PLAN: PT FREQUENCY: 1-2x/week  PT DURATION: 12 wks (likely D/C by 8)  PLANNED INTERVENTIONS: Therapeutic exercises, Therapeutic activity, Neuro Muscular re-education, Balance training, Gait training, Patient/Family education, Stair training, Aquatic Therapy, Dry Needling, Spinal mobilization, Cryotherapy, Moist heat, Taping, Traction, and Manual therapy  PLAN FOR NEXT SESSION: continue with general conditioning, endurance; UE and LE strength circuits alternate  Daleen Bo PT, DPT 05/04/21 2:27 PM

## 2021-05-05 ENCOUNTER — Ambulatory Visit: Payer: Medicare Other | Admitting: Cardiovascular Disease

## 2021-05-05 NOTE — Progress Notes (Unsigned)
Date:  05/05/2021   ID:  Sonia [name], DOB [birth year], MRN [id number]  PCP:  Glenis Smoker, MD  Cardiologist:  [name], MD  Electrophysiologist:  None   Chief Complaint:  Follow up- CAD   History of Present Illness: 81 yo female with history of CAD, COPD, GERD and hypothyroidism who is being seen today for follow up. She was admitted to [facility] May 2018 with c/o left arm pain and found to have a NSTEMI. Cardiac cath May 2018 with severe stenosis in the mid RCA treated with a drug eluting stent. Echo May 2018 with normal LV systolic function, mild valve disease. She was discharged on Brilinta and readmitted one week later with a GI bleed. EGD showed gastritis. She was changed from Brilinta to Plavix and bleeding resolved. Chest pain and dyspnea at times felt to be due to her asthma as it improved with her inhalers. She has not tolerated statins.nmShe had left arm pain in early 2021. Cardiac cath 04/14/19 with non-obstructive disease in the LAD and Circumflex and patent stent in the RCA with minimal restenosis. LV systolic function was normal.   She is here today for follow up. The patient denies any chest pain, dyspnea, palpitations, lower extremity edema, orthopnea, PND, dizziness, near syncope or syncope.    Primary Care Physician: Glenis Smoker, MD  Past Medical History:  Diagnosis Date   Arthritis    Asthma    daily and prn inhalers; states good control currently   CAD S/P percutaneous coronary angioplasty 07/15/2016   COPD (chronic obstructive pulmonary disease) (HCC)    GERD (gastroesophageal reflux disease)    no current med.   GI bleed 07/25/2016   Brilinta changed to Plavix   Hashimoto's thyroiditis    Headache    allergies; silent migraines   Hiatal hernia    Hypothyroidism    Malignant melanoma ([place]) 08/20/2019   Left Posterior Neck (in situ) exc   Pneumonia    Sclerosing adenosis of left breast 03/2015   Vitamin D deficiency      Past Surgical History:  Procedure Laterality Date   ABDOMINAL HYSTERECTOMY     partial   APPENDECTOMY     BREAST BIOPSY Left    BREAST CYST EXCISION Left    BREAST EXCISIONAL BIOPSY Left 03/2015   BREAST LUMPECTOMY Right 08/2018   BREAST LUMPECTOMY WITH RADIOACTIVE SEED LOCALIZATION Left 04/07/2015   Procedure: BREAST LUMPECTOMY WITH RADIOACTIVE SEED LOCALIZATION;  Surgeon: Erroll Luna, MD;  Location: [place];  Service: General;  Laterality: Left;   BREAST LUMPECTOMY WITH RADIOACTIVE SEED LOCALIZATION Right 08/13/2018   Procedure: RIGHT BREAST LUMPECTOMY WITH RADIOACTIVE SEED LOCALIZATION;  Surgeon: Rolm Bookbinder, MD;  Location: [place];  Service: General;  Laterality: Right;   CORONARY STENT INTERVENTION N/A 07/15/2016   Procedure: Coronary Stent Intervention;  Surgeon: Sherren Mocha, MD;  Location: [place] [facility];  Service: Cardiovascular;  Laterality: N/A;   ESOPHAGOGASTRODUODENOSCOPY (EGD) WITH PROPOFOL Left 07/25/2016   Procedure: ESOPHAGOGASTRODUODENOSCOPY (EGD) WITH PROPOFOL;  Surgeon: Ronnette Juniper, MD;  Location: [place];  Service: Gastroenterology;  Laterality: Left;   KNEE ARTHROSCOPY Right 12/07/2003   LAPAROSCOPIC SALPINGO OOPHERECTOMY Right 09/08/2003   LEFT HEART CATH N/A 07/15/2016   Procedure: Left Heart Cath;  Surgeon: Sherren Mocha, MD;  Location: [place] [facility];  Service: Cardiovascular;  Laterality: N/A;   LEFT HEART CATH AND CORONARY ANGIOGRAPHY N/A 04/14/2019   Procedure: LEFT HEART CATH AND  CORONARY ANGIOGRAPHY;  Surgeon: Burnell Blanks, MD;  Location: [place] [facility];  Service: Cardiovascular;  Laterality: N/A;   LYSIS OF ADHESION  09/08/2003   NASAL SINUS SURGERY  age 67   RECTAL POLYPECTOMY  10/02/2006    Current Outpatient Medications  Medication Sig Dispense Refill   albuterol (PROVENTIL) (2.5 MG/3ML) 0.083% nebulizer solution Take 3 mLs (2.5 mg total) by nebulization every 6 (six) hours as  needed for wheezing or shortness of breath. (Patient not taking: Reported on 10/11/2020) 360 mL 11   albuterol (VENTOLIN HFA) 108 (90 Base) MCG/ACT inhaler INHALE 2 PUFFS BY MOUTH EVERY 6 HOURS AS NEEDED FOR WHEEZING OR FOR SHORTNESS OF BREATH 27 g 0   calcium carbonate (TUMS - DOSED IN MG ELEMENTAL CALCIUM) 500 MG chewable tablet Chew 500 mg by mouth daily as needed for indigestion or heartburn.     cetirizine (ZYRTEC) 10 MG tablet Take 10 mg by mouth daily.     Cholecalciferol 25 MCG (1000 UT) capsule Take 10,000 Units by mouth 3 (three) times a week.     cromolyn (NASALCROM) 5.2 MG/ACT nasal spray Place 2 sprays into both nostrils daily as needed for allergies.     Cyanocobalamin (B-12) 5000 MCG CAPS Take 5,000 mcg by mouth every Monday, Wednesday, and Friday.     levothyroxine (SYNTHROID) 112 MCG tablet Synthroid 112 mcg tablet  TAKE 1 TABLET BY MOUTH ONCE DAILY IN THE MORNING ON AN EMPTY STOMACH     nitroGLYCERIN (NITROSTAT) 0.4 MG SL tablet Place 0.4 mg under the tongue every 5 (five) minutes as needed for chest pain.     PERFOROMIST 20 MCG/2ML nebulizer solution USE 1 VIAL  IN  NEBULIZER TWICE  DAILY - Morning And Evening 2 mL 11   predniSONE (DELTASONE) 10 MG tablet Take 2 tablets daily until feeling better then take 1 tablet daily 60 tablet 0   predniSONE (DELTASONE) 10 MG tablet 4tabX2d, 2tabsX2d, 1tabX2d 14 tablet 0   rosuvastatin (CRESTOR) 5 MG tablet 1 tablet     tretinoin (RETIN-A) 0.1 % cream Apply topically at bedtime.     No current facility-administered medications for this visit.    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  Tobacco Use   Smoking status: Never   Smokeless tobacco: Never  Scientific laboratory technician Use: Never used  Substance and Sexual Activity   Alcohol use: Not Currently    Comment: rarely   Drug use: No   Sexual activity: Not on file  Other Topics Concern   Not on file  Social History Narrative   Not on file   Social Determinants of Health   Financial Resource Strain: Not on file  Food Insecurity: Not on file  Transportation Needs: Not on file  Physical Activity: Not on file  Stress: Not on file  Social Connections: Not on file  Intimate Partner Violence: Not on file    Family History  Problem Relation Age of Onset  Asthma Mother    Heart disease Mother    Asthma Sister    Heart disease Sister    Breast cancer Sister 19   Heart disease Father    Kidney disease Father    Prostate cancer Brother        dx. 24s   Heart attack Brother 55   Alzheimer's disease Brother 28   Breast cancer Maternal Aunt 70   Stomach cancer Maternal Grandfather 57   Asthma Sister    Memory loss Sister    Heart attack Son 33   Spinal muscular atrophy Grandchild        type II   Cancer Other 57       niece dx. ca of salivary gland    Breast cancer Other 6       niece; negative genetic testing 10 years ago   Melanoma Other    Alzheimer's disease Maternal Aunt    Cervical cancer Cousin        maternal 1st cousin dx. at 42   Breast cancer Cousin 75       maternal 1st cousin   Lung cancer Cousin 62       maternal 1st cousin; smoker   Heart disease Cousin    Leukemia Cousin 14       paternal 1st cousin   Breast cancer Other        paternal great grandmother (PGF's mother) dx. late 26s-early 69s   Coronary artery disease Other     Review of Systems:  As stated in the HPI and otherwise negative.   There were no vitals taken for this visit.  Physical Examination: General: Well  developed, well nourished, NAD  HEENT: OP clear, mucus membranes moist  SKIN: warm, dry. No rashes. Neuro: No focal deficits  Musculoskeletal: Muscle strength 5/5 all ext  Psychiatric: Mood and affect normal  Neck: No JVD, no carotid bruits, no thyromegaly, no lymphadenopathy.  Lungs:Clear bilaterally, no wheezes, rhonci, crackles Cardiovascular: Regular rate and rhythm. No murmurs, gallops or rubs. Abdomen:Soft. Bowel sounds present. Non-tender.  Extremities: No lower extremity edema. Pulses are 2 + in the bilateral DP/PT.  Echo 07/16/16: - Left ventricle: The cavity size was normal. Systolic function was   normal. The estimated ejection fraction was in the range of 60%   to 65%. Wall motion was normal; there were no regional wall   motion abnormalities. Features are consistent with a pseudonormal   left ventricular filling pattern, with concomitant abnormal   relaxation and increased filling pressure (grade 2 diastolic   dysfunction). Doppler parameters are consistent with high   ventricular filling pressure. - Mitral valve: Calcified annulus. There was mild regurgitation. - Tricuspid valve: There was mild regurgitation. - Pulmonic valve: There was trivial regurgitation. - Pulmonary arteries: PA peak pressure: 37 mm Hg (S).  Impressions:  - The right ventricular systolic pressure was increased consistent   with mild pulmonary hypertension.  EKG:  EKG is *** ordered today. The ekg ordered today demonstrates   Recent Labs: No results found for requested labs within last 8760 hours.   Lipid Panel    Component Value Date/Time   CHOL 174 07/26/2017 1001   TRIG 65 07/26/2017 1001   HDL 76 07/26/2017 1001   CHOLHDL 2.5 01/16/2017 1036   CHOLHDL 3.0 07/13/2016 2331   VLDL 12 07/13/2016 2331   LDLCALC 85 07/26/2017 1001     Wt Readings from Last 3 Encounters:  10/11/20 159 lb 9.6 oz (72.4 kg)  05/24/20 161 lb 9.6 oz (73.3 kg)  01/05/20 156 lb (70.8 kg)     Other studies  Reviewed: Additional studies/ records that were reviewed today include: . Review of the above records demonstrates:   Assessment and Plan:   1. CAD with angina: She has no chest pain. *** ?ASA. Continue statin.  She only tolerates low doses of statins. Currently taking Crestor 5 mg two days per week.   2. HLD: LDL *** Continue low dose statin. .    Current medicines are reviewed at length with the patient today.  The patient does not have concerns regarding medicines.  The following changes have been made:  no change  Labs/ tests ordered today include:   No orders of the defined types were placed in this encounter.    Disposition:   FU with me in 12 months   Signed, [name], MD 05/05/2021 6:03 AM    [facility] [place], [address], [place]  [address] Phone: [phone]; Fax: [phone]

## 2021-05-09 ENCOUNTER — Other Ambulatory Visit: Payer: Self-pay

## 2021-05-09 ENCOUNTER — Encounter (HOSPITAL_BASED_OUTPATIENT_CLINIC_OR_DEPARTMENT_OTHER): Payer: Self-pay | Admitting: Physical Therapy

## 2021-05-09 ENCOUNTER — Ambulatory Visit (HOSPITAL_BASED_OUTPATIENT_CLINIC_OR_DEPARTMENT_OTHER): Payer: Medicare Other | Admitting: Physical Therapy

## 2021-05-09 DIAGNOSIS — R262 Difficulty in walking, not elsewhere classified: Secondary | ICD-10-CM | POA: Diagnosis not present

## 2021-05-09 DIAGNOSIS — R293 Abnormal posture: Secondary | ICD-10-CM | POA: Diagnosis not present

## 2021-05-09 NOTE — Therapy (Signed)
OUTPATIENT PHYSICAL THERAPY TREATMENT   Patient Name: Sonia [name] MRN: [id number] DOB:[birth year], 81 y.o., female Today's Date: 05/09/2021   PT End of Session - 05/09/21 1249     Visit Number 5    Number of Visits 25    Date for PT Re-Evaluation 07/24/21    Authorization Type MCR    PT Start Time 1300    PT Stop Time 1340    PT Time Calculation (min) 40 min    Activity Tolerance Patient tolerated treatment well    Behavior During Therapy [facility] for tasks assessed/performed                 Past Medical History:  Diagnosis Date   Arthritis    Asthma    daily and prn inhalers; states good control currently   CAD S/P percutaneous coronary angioplasty 07/15/2016   COPD (chronic obstructive pulmonary disease) (HCC)    GERD (gastroesophageal reflux disease)    no current med.   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   Vitamin D deficiency    Past Surgical History:  Procedure Laterality Date   ABDOMINAL HYSTERECTOMY     partial   APPENDECTOMY     BREAST BIOPSY Left    BREAST CYST EXCISION Left    BREAST EXCISIONAL BIOPSY Left 03/2015   BREAST LUMPECTOMY Right 08/2018   BREAST LUMPECTOMY WITH RADIOACTIVE SEED LOCALIZATION Left 04/07/2015   Procedure: BREAST LUMPECTOMY WITH RADIOACTIVE SEED LOCALIZATION;  Surgeon: Erroll Luna, MD;  Location: [place];  Service: General;  Laterality: Left;   BREAST LUMPECTOMY WITH RADIOACTIVE SEED LOCALIZATION Right 08/13/2018   Procedure: RIGHT BREAST LUMPECTOMY WITH RADIOACTIVE SEED LOCALIZATION;  Surgeon: Rolm Bookbinder, MD;  Location: [place];  Service: General;  Laterality: Right;   CORONARY STENT INTERVENTION N/A 07/15/2016   Procedure:  Coronary Stent Intervention;  Surgeon: Sherren Mocha, MD;  Location: [place] [facility];  Service: Cardiovascular;  Laterality: N/A;   ESOPHAGOGASTRODUODENOSCOPY (EGD) WITH PROPOFOL Left 07/25/2016   Procedure: ESOPHAGOGASTRODUODENOSCOPY (EGD) WITH PROPOFOL;  Surgeon: Ronnette Juniper, MD;  Location: [place];  Service: Gastroenterology;  Laterality: Left;   KNEE ARTHROSCOPY Right 12/07/2003   LAPAROSCOPIC SALPINGO OOPHERECTOMY Right 09/08/2003   LEFT HEART CATH N/A 07/15/2016   Procedure: Left Heart Cath;  Surgeon: Sherren Mocha, MD;  Location: [place] [facility];  Service: Cardiovascular;  Laterality: N/A;   LEFT HEART CATH AND CORONARY ANGIOGRAPHY N/A 04/14/2019   Procedure: LEFT HEART CATH AND CORONARY ANGIOGRAPHY;  Surgeon: Burnell Blanks, MD;  Location: [place] [facility];  Service: Cardiovascular;  Laterality: N/A;   LYSIS OF ADHESION  09/08/2003   NASAL SINUS SURGERY  age 60   RECTAL POLYPECTOMY  10/02/2006   Patient Active Problem List   Diagnosis Date Noted   Coronary artery disease of native artery of native heart with stable angina pectoris (HCC)    Asthma, chronic, moderate persistent, uncomplicated 48/54/6270   DOE (dyspnea on exertion) 07/31/2018   Osteopenia 06/04/2018   Carcinoma of upper-outer quadrant of right breast in female, estrogen receptor positive ([place]) 05/30/2018   GI bleed 07/25/2016   Melena 07/24/2016   Acute renal insufficiency 07/24/2016   CAD S/P  percutaneous coronary angioplasty 07/16/2016   NSTEMI (non-ST elevated myocardial infarction) ([place]) 07/13/2016   Severe asthma with acute exacerbation 03/07/2016   Genetic testing 06/09/2015   Family history of breast cancer in female 05/10/2015   History of melanoma 05/10/2015   Atypical lobular hyperplasia of left breast 04/26/2015   Vision problem 01/08/2014   Hiatus hernia syndrome 08/01/2012   Palpitations 10/18/2010   Acute chest pain 10/18/2010   Hypothyroidism 10/18/2010   Dyslipidemia 06/07/2009    Upper airway cough syndrome 03/29/2009   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 She felt it last about 2 days. She states she has to be careful with her low back.   PERTINENT HISTORY: asthma PAIN:  Are you having pain? No   LIVING ENVIRONMENT: Lives with: lives with their family, dog Lives in: House/apartment Stairs: No;    OCCUPATION: retired Marine scientist  PLOF: Independent  PATIENT GOALS: Improve endurance and would like to be able to walk more, be able to do more daily functions/chores without being tired   OBJECTIVE:     FUNCTIONAL TESTS:  Previous: 5 times sit to stand: 13s 6 minute walk test: 1020ft  no breaks needed (97% O2 saturation)   5TSTS did not incr breathing significantly  TODAY'S TREATMENT:  UBE L1 1 min retro and fwd; 14min off; 4x  60min rest breaks: Farmer's carry 10lbs in each hand 1 hallway length 4x laps Sit to Stand YTB at knees 5lb KB 3x10 Standing HR/TR 20x  Circuit: 30s rest 3x rounds Blue TB rowing 3x10 Standing march 30x  PATIENT EDUCATION:  Education details:  anatomy, exercise progression, DOMS expectations, muscle firing,  envelope of function, HEP, POC Person educated: Patient Education method: Explanation, Demonstration, Tactile cues, Verbal cues, and Handouts Education comprehension: verbalized understanding, returned demonstration, verbal cues required, tactile cues required, and needs further education   HOME EXERCISE PROGRAM: [id number]   ASSESSMENT:  CLINICAL IMPRESSION: Pt able to continue with resistance circuits and  timed rest breaks. Pt able to progress volume of loading and able to recover within set rest periods. Pt at RPE 6-7 throughout session. Continue with resistance circuits for endurance and strength.  Objective impairments include decreased activity tolerance, decreased endurance, difficulty walking, decreased strength, increased muscle spasms, impaired flexibility, and postural dysfunction. These impairments are limiting patient from cleaning, community activity, and lives independently- has difficulty with endurance to complete ADLs . Personal factors including Fitness, Time since onset of injury/illness/exacerbation, and 3+ comorbidities: h/o breast CA, pneumonia, asthma  are also affecting patient's functional outcome. Patient will benefit from skilled PT to address above impairments and improve overall function. We stood and talked in the aquatics facility to ensure that pt would tolerate temp and humidity and she verbalized readiness for aquatic treatments.   REHAB POTENTIAL: Good  CLINICAL DECISION MAKING: Unstable/unpredictable  EVALUATION COMPLEXITY: High   GOALS:   SHORT TERM GOALS:  STG Name Target Date Goal status  1 Pt will become independent with HEP in order to demonstrate synthesis of PT education.  06/20/2021  INITIAL   LONG TERM GOALS:   LTG  Name Target Date Goal status  1 Pt  will become independent with final HEP in order to demonstrate synthesis of PT education.   08/01/2021  INITIAL  2 Pt will be able to perform 5XSTS in under 12s  in order to demonstrate functional improvement above the cut off score for adults.  07/18/2021 INITIAL  3 Pt will be able to walk at least 3280 ft or 1074m during 6MWT in order to demonstrate improvement in functional endurance and mobility for decrease in all cause mortality.   07/18/2021 INITIAL  4 Pt will be able to demonstrate/report ability to walk >15 mins without pain in order to demonstrate functional improvement and tolerance to  exercise and community mobility.  07/18/2021 INITIAL   PLAN: PT FREQUENCY: 1-2x/week  PT DURATION: 12 wks (likely D/C by 8)  PLANNED INTERVENTIONS: Therapeutic exercises, Therapeutic activity, Neuro Muscular re-education, Balance training, Gait training, Patient/Family education, Stair training, Aquatic Therapy, Dry Needling, Spinal mobilization, Cryotherapy, Moist heat, Taping, Traction, and Manual therapy  PLAN FOR NEXT SESSION: continue with general conditioning, endurance; UE and LE strength circuits alternate  Daleen Bo PT, DPT 05/09/21 1:44 PM

## 2021-05-11 ENCOUNTER — Ambulatory Visit (HOSPITAL_BASED_OUTPATIENT_CLINIC_OR_DEPARTMENT_OTHER): Payer: Medicare Other | Admitting: Physical Therapy

## 2021-05-11 NOTE — Therapy (Incomplete)
?OUTPATIENT PHYSICAL THERAPY TREATMENT ? ? ?Patient Name: Jackalynn Art ?MRN: [id number] ?DOB:13-Jun-1940, 81 y.o., female ?Today's Date: 05/11/2021 ? ? ? ? ? ? ? ? ?Past Medical History:  ?Diagnosis Date  ? Arthritis   ? Asthma   ? daily and prn inhalers; states good control currently  ? CAD S/P percutaneous coronary angioplasty 07/15/2016  ? COPD (chronic obstructive pulmonary disease) ([place])   ? GERD (gastroesophageal reflux disease)   ? no current med.  ? GI bleed 07/25/2016  ? Brilinta changed to Plavix  ? Hashimoto's thyroiditis   ? Headache   ? allergies; silent migraines  ? Hiatal hernia   ? Hypothyroidism   ? Malignant melanoma ([place]) 08/20/2019  ? Left Posterior Neck (in situ) exc  ? Pneumonia   ? 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 Vitamin D deficiency   ? ?Past Surgical History:  ?Procedure Laterality Date  ? ABDOMINAL HYSTERECTOMY    ? partial  ? APPENDECTOMY    ? BREAST BIOPSY Left   ? BREAST CYST EXCISION Left   ? BREAST EXCISIONAL BIOPSY Left 03/2015  ? BREAST LUMPECTOMY Right 08/2018  ? BREAST LUMPECTOMY WITH RADIOACTIVE SEED LOCALIZATION Left 04/07/2015  ? Procedure: BREAST LUMPECTOMY WITH RADIOACTIVE SEED LOCALIZATION;  Surgeon: Erroll Luna, MD;  Location: [place];  Service: General;  Laterality: Left;  ? BREAST LUMPECTOMY WITH RADIOACTIVE SEED LOCALIZATION Right 08/13/2018  ? Procedure: RIGHT BREAST LUMPECTOMY WITH RADIOACTIVE SEED LOCALIZATION;  Surgeon: Rolm Bookbinder, MD;  Location: [place];  Service: General;  Laterality: Right;  ? CORONARY STENT INTERVENTION N/A 07/15/2016  ? Procedure: Coronary Stent Intervention;  Surgeon: Sherren Mocha, MD;  Location: [place] [facility];  Service: Cardiovascular;  Laterality: N/A;  ? ESOPHAGOGASTRODUODENOSCOPY (EGD) WITH PROPOFOL Left 07/25/2016  ? Procedure: ESOPHAGOGASTRODUODENOSCOPY (EGD) WITH PROPOFOL;  Surgeon: Ronnette Juniper, MD;  Location: [place];  Service: Gastroenterology;  Laterality: Left;   ? KNEE ARTHROSCOPY Right 12/07/2003  ? LAPAROSCOPIC SALPINGO OOPHERECTOMY Right 09/08/2003  ? LEFT HEART CATH N/A 07/15/2016  ? Procedure: Left Heart Cath;  Surgeon: Sherren Mocha, MD;  Location: [place] [facility];  Service: Cardiovascular;  Laterality: N/A;  ? LEFT HEART CATH AND CORONARY ANGIOGRAPHY N/A 04/14/2019  ? Procedure: LEFT HEART CATH AND CORONARY ANGIOGRAPHY;  Surgeon: Burnell Blanks, MD;  Location: [place] [facility];  Service: Cardiovascular;  Laterality: N/A;  ? LYSIS OF ADHESION  09/08/2003  ? NASAL SINUS SURGERY  age 20  ? RECTAL POLYPECTOMY  10/02/2006  ? ?Patient Active Problem List  ? Diagnosis Date Noted  ? Coronary artery disease of native artery of native heart with stable angina pectoris ([place])   ? Asthma, chronic, moderate persistent, uncomplicated 44/03/270  ? DOE (dyspnea on exertion) 07/31/2018  ? Osteopenia 06/04/2018  ? Carcinoma of upper-outer quadrant of right breast in female, estrogen receptor positive ([place]) 05/30/2018  ? GI bleed 07/25/2016  ? Melena 07/24/2016  ? Acute renal insufficiency 07/24/2016  ? CAD S/P percutaneous coronary angioplasty 07/16/2016  ? NSTEMI (non-ST elevated myocardial infarction) ([place]) 07/13/2016  ? Severe asthma with acute exacerbation 03/07/2016  ? Genetic testing 06/09/2015  ? Family history of breast cancer in female 05/10/2015  ? History of melanoma 05/10/2015  ? Atypical lobular hyperplasia of left breast 04/26/2015  ? Vision problem 01/08/2014  ? Hiatus hernia syndrome 08/01/2012  ? Palpitations 10/18/2010  ? Acute chest pain 10/18/2010  ? Hypothyroidism 10/18/2010  ? Dyslipidemia 06/07/2009  ? Upper airway cough syndrome 03/29/2009  ?  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 GERD 12/05/2006  ? ? ?PCP: Glenis Smoker, MD ? ?REFERRING PROVIDER: Glenis Smoker, MD  ? ?REFERRING DIAG: R26.89 (ICD-10-CM) - Other abnormalities of gait and mobility  ? ?THERAPY DIAG:  ?No diagnosis found. ? ?ONSET DATE: about 2 years ago- when COVID shut down  began ? ?SUBJECTIVE:  ? ?SUBJECTIVE STATEMENT: ?Pt states she was a little sore last time. She felt it last about 2 days. She states she has to be careful with her low back.  ? ?PERTINENT HISTORY: ?asthma ?PAIN:  ?Are you having pain? No ? ? ?LIVING ENVIRONMENT: ?Lives with: lives with their family, dog ?Lives in: House/apartment ?Stairs: No;  ? ? ?OCCUPATION: retired Marine scientist ? ?PLOF: Independent ? ?PATIENT GOALS: Improve endurance and would like to be able to walk more, be able to do more daily functions/chores without being tired ? ? ?OBJECTIVE:  ? ? ? ?FUNCTIONAL TESTS:  ?Previous: ?5 times sit to stand: 13s ?6 minute walk test: 1086ft  no breaks needed (97% O2 saturation)  ? 5TSTS did not incr breathing significantly ? ?TODAY'S TREATMENT: ? ?UBE L1 1 min retro and fwd; 66min off; 4x ? ?61min rest breaks: ?Farmer's carry 10lbs in each hand 1 hallway length 4x laps ?Sit to Stand YTB at knees 5lb KB 3x10 ?Standing HR/TR 20x ? ?Circuit: 30s rest 3x rounds ?Blue TB rowing 3x10 ?Standing march 30x ? ?PATIENT EDUCATION:  ?Education details:  anatomy, exercise progression, DOMS expectations, muscle firing,  envelope of function, HEP, POC ?Person educated: Patient ?Education method: Explanation, Demonstration, Tactile cues, Verbal cues, and Handouts ?Education comprehension: verbalized understanding, returned demonstration, verbal cues required, tactile cues required, and needs further education ? ? ?HOME EXERCISE PROGRAM: ?[id number] ? ? ?ASSESSMENT: ? ?CLINICAL IMPRESSION: ?Pt able to continue with resistance circuits and timed rest breaks. Pt able to progress volume of loading and able to recover within set rest periods. Pt at RPE 6-7 throughout session. Continue with resistance circuits for endurance and strength. ? ?Objective impairments include decreased activity tolerance, decreased endurance, difficulty walking, decreased strength, increased muscle spasms, impaired flexibility, and postural dysfunction. These  impairments are limiting patient from cleaning, community activity, and lives independently- has difficulty with endurance to complete ADLs . Personal factors including Fitness, Time since onset of injury/illness/exacerbation, and 3+ comorbidities: h/o breast CA, pneumonia, asthma  are also affecting patient's functional outcome. Patient will benefit from skilled PT to address above impairments and improve overall function. We stood and talked in the aquatics facility to ensure that pt would tolerate temp and humidity and she verbalized readiness for aquatic treatments.  ? ?REHAB POTENTIAL: Good ? ?CLINICAL DECISION MAKING: Unstable/unpredictable ? ?EVALUATION COMPLEXITY: High ? ? ?GOALS: ? ? ?SHORT TERM GOALS: ? ?STG Name Target Date Goal status  ?1 Pt will become independent with HEP in order to demonstrate synthesis of PT education. ? 06/22/2021 ? INITIAL  ? ?LONG TERM GOALS:  ? ?LTG Name Target Date Goal status  ?1 Pt  will become independent with final HEP in order to demonstrate synthesis of PT education. ? ? 08/03/2021 ? INITIAL  ?2 Pt will be able to perform 5XSTS in under 12s  in order to demonstrate functional improvement above the cut off score for adults. ? 07/18/2021 INITIAL  ?3 Pt will be able to walk at least 3280 ft or 1032m during 6MWT in order to demonstrate improvement in functional endurance and mobility for decrease in all cause mortality.   07/18/2021 INITIAL  ?4  Pt will be able to demonstrate/report ability to walk >15 mins without pain in order to demonstrate functional improvement and tolerance to exercise and community mobility. ? 07/18/2021 INITIAL  ? ?PLAN: ?PT FREQUENCY: 1-2x/week ? ?PT DURATION: 12 wks (likely D/C by 8) ? ?PLANNED INTERVENTIONS: Therapeutic exercises, Therapeutic activity, Neuro Muscular re-education, Balance training, Gait training, Patient/Family education, Stair training, Aquatic Therapy, Dry Needling, Spinal mobilization, Cryotherapy, Moist heat, Taping, Traction, and  Manual therapy ? ?PLAN FOR NEXT SESSION: continue with general conditioning, endurance; UE and LE strength circuits alternate ? ?Daleen Bo PT, DPT ?05/11/21 12:56 PM ? ? ? ?

## 2021-05-15 ENCOUNTER — Ambulatory Visit (HOSPITAL_BASED_OUTPATIENT_CLINIC_OR_DEPARTMENT_OTHER): Payer: Medicare Other | Attending: Family Medicine | Admitting: Physical Therapy

## 2021-05-15 ENCOUNTER — Other Ambulatory Visit: Payer: Self-pay

## 2021-05-15 ENCOUNTER — Encounter (HOSPITAL_BASED_OUTPATIENT_CLINIC_OR_DEPARTMENT_OTHER): Payer: Self-pay | Admitting: Physical Therapy

## 2021-05-15 DIAGNOSIS — R262 Difficulty in walking, not elsewhere classified: Secondary | ICD-10-CM | POA: Diagnosis not present

## 2021-05-15 DIAGNOSIS — R293 Abnormal posture: Secondary | ICD-10-CM

## 2021-05-15 NOTE — Therapy (Signed)
OUTPATIENT PHYSICAL THERAPY TREATMENT   Patient Name: Tomicka Lover MRN: [id number] DOB:06-11-1940, 81 y.o., female Today's Date: 05/15/2021   PT End of Session - 05/15/21 1140     Visit Number 6    Number of Visits 25    Date for PT Re-Evaluation 07/24/21    Authorization Type MCR    PT Start Time 1145    PT Stop Time 1225    PT Time Calculation (min) 40 min    Activity Tolerance Patient tolerated treatment well    Behavior During Therapy [facility] for tasks assessed/performed                  Past Medical History:  Diagnosis Date   Arthritis    Asthma    daily and prn inhalers; states good control currently   CAD S/P percutaneous coronary angioplasty 07/15/2016   COPD (chronic obstructive pulmonary disease) (HCC)    GERD (gastroesophageal reflux disease)    no current med.   GI bleed 07/25/2016   Brilinta changed to Plavix   Hashimoto's thyroiditis    Headache    allergies; silent migraines   Hiatal hernia    Hypothyroidism    Malignant melanoma ([place]) 08/20/2019   Left Posterior Neck (in situ) exc   Pneumonia    Sclerosing adenosis of left breast 03/2015   Vitamin D deficiency    Past Surgical History:  Procedure Laterality Date   ABDOMINAL HYSTERECTOMY     partial   APPENDECTOMY     BREAST BIOPSY Left    BREAST CYST EXCISION Left    BREAST EXCISIONAL BIOPSY Left 03/2015   BREAST LUMPECTOMY Right 08/2018   BREAST LUMPECTOMY WITH RADIOACTIVE SEED LOCALIZATION Left 04/07/2015   Procedure: BREAST LUMPECTOMY WITH RADIOACTIVE SEED LOCALIZATION;  Surgeon: Erroll Luna, MD;  Location: [place];  Service: General;  Laterality: Left;   BREAST LUMPECTOMY WITH RADIOACTIVE SEED LOCALIZATION Right 08/13/2018   Procedure: RIGHT BREAST LUMPECTOMY WITH RADIOACTIVE SEED LOCALIZATION;  Surgeon: Rolm Bookbinder, MD;  Location: [place];  Service: General;  Laterality: Right;   CORONARY STENT INTERVENTION N/A 07/15/2016   Procedure:  Coronary Stent Intervention;  Surgeon: Sherren Mocha, MD;  Location: [place] [facility];  Service: Cardiovascular;  Laterality: N/A;   ESOPHAGOGASTRODUODENOSCOPY (EGD) WITH PROPOFOL Left 07/25/2016   Procedure: ESOPHAGOGASTRODUODENOSCOPY (EGD) WITH PROPOFOL;  Surgeon: Ronnette Juniper, MD;  Location: [place];  Service: Gastroenterology;  Laterality: Left;   KNEE ARTHROSCOPY Right 12/07/2003   LAPAROSCOPIC SALPINGO OOPHERECTOMY Right 09/08/2003   LEFT HEART CATH N/A 07/15/2016   Procedure: Left Heart Cath;  Surgeon: Sherren Mocha, MD;  Location: [place] [facility];  Service: Cardiovascular;  Laterality: N/A;   LEFT HEART CATH AND CORONARY ANGIOGRAPHY N/A 04/14/2019   Procedure: LEFT HEART CATH AND CORONARY ANGIOGRAPHY;  Surgeon: Burnell Blanks, MD;  Location: [place] [facility];  Service: Cardiovascular;  Laterality: N/A;   LYSIS OF ADHESION  09/08/2003   NASAL SINUS SURGERY  age 50   RECTAL POLYPECTOMY  10/02/2006   Patient Active Problem List   Diagnosis Date Noted   Coronary artery disease of native artery of native heart with stable angina pectoris ([place])    Asthma, chronic, moderate persistent, uncomplicated 09/40/7680   DOE (dyspnea on exertion) 07/31/2018   Osteopenia 06/04/2018   Carcinoma of upper-outer quadrant of right breast in female, estrogen receptor positive ([place]) 05/30/2018   GI bleed 07/25/2016   Melena 07/24/2016   Acute renal insufficiency 07/24/2016   CAD  S/P percutaneous coronary angioplasty 07/16/2016   NSTEMI (non-ST elevated myocardial infarction) ([place]) 07/13/2016   Severe asthma with acute exacerbation 03/07/2016   Genetic testing 06/09/2015   Family history of breast cancer in female 05/10/2015   History of melanoma 05/10/2015   Atypical lobular hyperplasia of left breast 04/26/2015   Vision problem 01/08/2014   Hiatus hernia syndrome 08/01/2012   Palpitations 10/18/2010   Acute chest pain 10/18/2010   Hypothyroidism 10/18/2010   Dyslipidemia 06/07/2009    Upper airway cough syndrome 03/29/2009   COPD with asthma ([place]) 12/05/2006   GERD 12/05/2006    PCP: Glenis Smoker, MD  REFERRING PROVIDER: Glenis Smoker, MD   REFERRING DIAG: R26.89 (ICD-10-CM) - Other abnormalities of gait and mobility   THERAPY DIAG:  Difficulty in walking, not elsewhere classified  Abnormal posture  ONSET DATE: about 2 years ago- when COVID shut down began  SUBJECTIVE:   SUBJECTIVE STATEMENT: Pt states her allergies are really bothering her recently. She felt "so bad last time I couldn't move." Pt states she does not feel great today.   PERTINENT HISTORY: asthma PAIN:  Are you having pain? No   LIVING ENVIRONMENT: Lives with: lives with their family, dog Lives in: House/apartment Stairs: No;    OCCUPATION: retired Marine scientist  PLOF: Independent  PATIENT GOALS: Improve endurance and would like to be able to walk more, be able to do more daily functions/chores without being tired   OBJECTIVE:  FOTO   42 at eval 50 at D/C 6 pts MCII  6th Visit FOTO 3/6: 42pts   FUNCTIONAL TESTS:  Previous: 5 times sit to stand: 13s 6 minute walk test: 106f  no breaks needed (97% O2 saturation)   5TSTS did not incr breathing significantly  TODAY'S TREATMENT:  Recumbent bike L1 2.5 min x2 with 1 min rest break in between Blue TB rowing 4x10 Blue TB chest press 3x10 GTB Tricep press 3x10 3lb DB bicep curls 3x10  Farmer's carry 10lbs in each hand 1 hallway length 4x laps  2x walking laps for time (2 min 58s to complete)    Previous: UBE L1 1 min retro and fwd; 183m off; 4x  55m76mrest breaks: Farmer's carry 10lbs in each hand 1 hallway length 4x laps Sit to Stand YTB at knees 5lb KB 3x10 Standing HR/TR 20x  Circuit: 30s rest 3x rounds Blue TB rowing 3x10 Standing march 30x  PATIENT EDUCATION:  Education details:  anatomy, exercise progression, DOMS expectations, muscle firing,  envelope of function, HEP, POC Person  educated: Patient Education method: Explanation, Demonstration, Tactile cues, Verbal cues, and Handouts Education comprehension: verbalized understanding, returned demonstration, verbal cues required, tactile cues required, and needs further education   HOME EXERCISE PROGRAM: [id number]   ASSESSMENT:  CLINICAL IMPRESSION: Pt exercise scaled back today due to increase in fatigue and SOB. Pt reports allergy season causing increased difficulty breathing. FOTO score is affected by pt's allergies and warmer weather. Pt able to do light UE and LE strength and endurance exercise but requires longer seated rest breaks. Pt was able to walk ~600f9f under 3 min which puts her on pace to improve on 6MWT if she is able to improve endurance. Plan to continue with general conditioning as tolerated.  Objective impairments include decreased activity tolerance, decreased endurance, difficulty walking, decreased strength, increased muscle spasms, impaired flexibility, and postural dysfunction. These impairments are limiting patient from cleaning, community activity, and lives independently- has difficulty with endurance to complete ADLs . Personal factors  including Fitness, Time since onset of injury/illness/exacerbation, and 3+ comorbidities: h/o breast CA, pneumonia, asthma  are also affecting patient's functional outcome. Patient will benefit from skilled PT to address above impairments and improve overall function. We stood and talked in the aquatics facility to ensure that pt would tolerate temp and humidity and she verbalized readiness for aquatic treatments.   REHAB POTENTIAL: Good  CLINICAL DECISION MAKING: Unstable/unpredictable  EVALUATION COMPLEXITY: High   GOALS:   SHORT TERM GOALS:  STG Name Target Date Goal status  1 Pt will become independent with HEP in order to demonstrate synthesis of PT education.  06/26/2021  INITIAL   LONG TERM GOALS:   LTG Name Target Date Goal status  1 Pt   will become independent with final HEP in order to demonstrate synthesis of PT education.   08/07/2021  INITIAL  2 Pt will be able to perform 5XSTS in under 12s  in order to demonstrate functional improvement above the cut off score for adults.  07/18/2021 INITIAL  3 Pt will be able to walk at least 3280 ft or 107mduring 6MWT in order to demonstrate improvement in functional endurance and mobility for decrease in all cause mortality.   07/18/2021 INITIAL  4 Pt will be able to demonstrate/report ability to walk >15 mins without pain in order to demonstrate functional improvement and tolerance to exercise and community mobility.  07/18/2021 INITIAL   PLAN: PT FREQUENCY: 1-2x/week  PT DURATION: 12 wks (likely D/C by 8)  PLANNED INTERVENTIONS: Therapeutic exercises, Therapeutic activity, Neuro Muscular re-education, Balance training, Gait training, Patient/Family education, Stair training, Aquatic Therapy, Dry Needling, Spinal mobilization, Cryotherapy, Moist heat, Taping, Traction, and Manual therapy  PLAN FOR NEXT SESSION: continue with general conditioning, endurance; UE and LE strength circuits alternate  ADaleen BoPT, DPT 05/15/21 12:29 PM

## 2021-05-17 ENCOUNTER — Other Ambulatory Visit: Payer: Self-pay | Admitting: Internal Medicine

## 2021-05-17 ENCOUNTER — Telehealth: Payer: Self-pay | Admitting: Internal Medicine

## 2021-05-17 DIAGNOSIS — B349 Viral infection, unspecified: Secondary | ICD-10-CM | POA: Diagnosis not present

## 2021-05-17 DIAGNOSIS — J45901 Unspecified asthma with (acute) exacerbation: Secondary | ICD-10-CM | POA: Diagnosis not present

## 2021-05-17 NOTE — Telephone Encounter (Addendum)
Called and spoke with pt who stated she began to have a flare up with her asthma. Pt said that she did have some '40mg'$  prednisone left at home which she took but states she feels like she needs to have more prednisone. Stated to pt that we need her to come in for an appt as last visit was 10/2020. Pt verbalized understanding and has been scheduled for an acute visit with RB 3/9. Nothing further needed. ?

## 2021-05-17 NOTE — Telephone Encounter (Signed)
Please advise patient requesting refill °

## 2021-05-18 ENCOUNTER — Ambulatory Visit (INDEPENDENT_AMBULATORY_CARE_PROVIDER_SITE_OTHER): Payer: Medicare Other | Admitting: Emergency Medicine

## 2021-05-18 ENCOUNTER — Encounter: Payer: Self-pay | Admitting: Emergency Medicine

## 2021-05-18 ENCOUNTER — Other Ambulatory Visit: Payer: Self-pay

## 2021-05-18 ENCOUNTER — Ambulatory Visit (HOSPITAL_BASED_OUTPATIENT_CLINIC_OR_DEPARTMENT_OTHER): Payer: Medicare Other | Admitting: Physical Therapy

## 2021-05-18 DIAGNOSIS — J45901 Unspecified asthma with (acute) exacerbation: Secondary | ICD-10-CM

## 2021-05-18 NOTE — Progress Notes (Signed)
Subjective:    Patient ID: Sonia [name], female    DOB: 03-06-1941, 81 y.o.   MRN: [id number]  HPI Sonia [name] is an 81 year old woman, never smoker has been followed in our office by Dr. Melvyn Novas for asthma.  Exacerbating factors including GERD with a hiatal hernia.  She had COVID-19 in late January 2023.  Perforomist twice daily on her med list but she is not taking - stopped it and is back on Symbicort bid, cromolyn nasal spray prn, zyrtec daily. Nasal rinses at least daily.   Increase head congestion and drainage over about weeks. She began to experience increased SOB beginning about 2 weeks ago, hearing wheeze, she was started on a prednisone taper 3/7, continued by Dr Melvyn Novas 3/8. Denies any change in her reflux sx > she is using TUMS daily, usually at night.    Pulmonary function testing 07/30/2011 reviewed by me showed severe obstruction without a bronchodilator response, normal lung volumes and normal diffusion capacity.  Most recent chest x-ray 01/05/2020 reviewed by me showed a stable large hiatal hernia, no infiltrates.   Review of Systems As per [facility]  Past Medical History:  Diagnosis Date   Arthritis    Asthma    daily and prn inhalers; states good control currently   CAD S/P percutaneous coronary angioplasty 07/15/2016   COPD (chronic obstructive pulmonary disease) (HCC)    GERD (gastroesophageal reflux disease)    no current med.   GI bleed 07/25/2016   Brilinta changed to Plavix   Hashimoto's thyroiditis    Headache    allergies; silent migraines   Hiatal hernia    Hypothyroidism    Malignant melanoma ([place]) 08/20/2019   Left Posterior Neck (in situ) exc   Pneumonia    Sclerosing adenosis of left breast 03/2015   Vitamin D deficiency      Family History  Problem Relation Age of Onset   Asthma Mother    Heart disease Mother    Asthma Sister    Heart disease Sister    Breast cancer Sister 50   Heart disease Father    Kidney disease Father     Prostate cancer Brother        dx. 56s   Heart attack Brother 40   Alzheimer's disease Brother 71   Breast cancer Maternal Aunt 70   Stomach cancer Maternal Grandfather 5   Asthma Sister    Memory loss Sister    Heart attack Son 76   Spinal muscular atrophy Grandchild        type II   Cancer Other 51       niece dx. ca of salivary gland    Breast cancer Other 13       niece; negative genetic testing 10 years ago   Melanoma Other    Alzheimer's disease Maternal Aunt    Cervical cancer Cousin        maternal 1st cousin dx. at 23   Breast cancer Cousin 60       maternal 1st cousin   Lung cancer Cousin 70       maternal 1st cousin; smoker   Heart disease Cousin    Leukemia Cousin 14       paternal 1st cousin   Breast cancer Other        paternal great grandmother (PGF's mother) dx. 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  Tobacco Use   Smoking status: Never   Smokeless tobacco: Never  Vaping Use   Vaping Use: Never used  Substance and Sexual Activity   Alcohol use: Not Currently    Comment: rarely   Drug use: No   Sexual activity: Not on file  Other Topics Concern   Not on file  Social History Narrative   Not on file   Social Determinants of Health   Financial Resource Strain: Not on file  Food Insecurity: Not on file  Transportation Needs: Not on file  Physical Activity: Not on file  Stress: Not on file  Social Connections: Not on file  Intimate Partner Violence: Not on file     Allergies  Allergen Reactions   Adenosine Anaphylaxis   Contrast Media [Iodinated Contrast Media] Anaphylaxis   Codeine Sulfate Nausea And Vomiting   Latex Other (See  Comments)    Skin irritation     Outpatient Medications Prior to Visit  Medication Sig Dispense Refill   albuterol (PROVENTIL) (2.5 MG/3ML) 0.083% nebulizer solution Take 3 mLs (2.5 mg total) by nebulization every 6 (six) hours as needed for wheezing or shortness of breath. 360 mL 11   albuterol (VENTOLIN HFA) 108 (90 Base) MCG/ACT inhaler INHALE 2 PUFFS BY MOUTH EVERY 6 HOURS AS NEEDED FOR WHEEZING OR FOR SHORTNESS OF BREATH 27 g 0   calcium carbonate (TUMS - DOSED IN MG ELEMENTAL CALCIUM) 500 MG chewable tablet Chew 500 mg by mouth daily as needed for indigestion or heartburn.     cetirizine (ZYRTEC) 10 MG tablet Take 10 mg by mouth daily.     Cholecalciferol 25 MCG (1000 UT) capsule Take 10,000 Units by mouth 3 (three) times a week.     cromolyn (NASALCROM) 5.2 MG/ACT nasal spray Place 2 sprays into both nostrils daily as needed for allergies.     Cyanocobalamin (B-12) 5000 MCG CAPS Take 5,000 mcg by mouth every Monday, Wednesday, and Friday.     levothyroxine (SYNTHROID) 112 MCG tablet Synthroid 112 mcg tablet  TAKE 1 TABLET BY MOUTH ONCE DAILY IN THE MORNING ON AN EMPTY STOMACH     nitroGLYCERIN (NITROSTAT) 0.4 MG SL tablet Place 0.4 mg under the tongue every 5 (five) minutes as needed for chest pain.     PERFOROMIST 20 MCG/2ML nebulizer solution USE 1 VIAL  IN  NEBULIZER TWICE  DAILY - Morning And Evening 2 mL 11   predniSONE (DELTASONE) 10 MG tablet Take 2 tablets daily until feeling better then take 1 tablet daily 60 tablet 0   predniSONE (DELTASONE) 10 MG tablet TAKE 4 TABLETS BY MOUTH DAILY FOR 2 DAYS THEN 2 TABLETS DAILY FOR 2 DAYS THEN 1 TABLET DAILY FOR 2 DAYS 14 tablet 0   rosuvastatin (CRESTOR) 5 MG tablet 1 tablet     tretinoin (RETIN-A) 0.1 % cream Apply topically at bedtime.     No facility-administered medications prior to visit.         Objective:   Physical Exam Vitals:   05/18/21 1153  BP: 128/76  Pulse: 100  Temp: 98.1 F (36.7 C)  TempSrc: Oral  SpO2: 96%   Weight: 145 lb (65.8 kg)  Height: '5\' 4"'$  (1.626 m)    Gen: Pleasant,  well-nourished, in no distress,  normal affect  ENT: No lesions,  mouth clear,  oropharynx clear, no postnasal drip  Neck: No JVD, Intermittent UA noise during conversation, distractible and improves. Stridor on exam  Lungs: No use of accessory muscles, no crackles or wheezing on normal respiration, no wheeze on forced expiration  Cardiovascular: RRR, heart sounds normal, no murmur or gallops, no peripheral edema  Musculoskeletal: No deformities, no cyanosis or clubbing  Neuro: alert, awake, non focal  Skin: Warm, no lesions or rash       Assessment & Plan:  Severe asthma with acute exacerbation She is having acute flaring symptoms, most of which appear to be upper airway in nature based on exam.  May be starting to improve some after the initiation of prednisone.  I talked to her about managing rhinitis more aggressively especially now that it is allergy season.  Also talked to her about temporarily adding a PPI to tamp down any contribution of her known GERD.  She wants to avoid for now because she is worried about side effects.  She is no longer on Perforomist, and is on Symbicort.  Plan to complete the prednisone taper and have her follow-up to assess improvement.    Please continue your Symbicort 2 puffs twice a day.  Rinse and gargle after using. Keep your albuterol available to use 2 puffs up to every 4 hours if needed for shortness of breath, chest tightness, wheezing. Complete your prednisone taper as directed until completely gone. Continue Zyrtec once daily Start using your cromolyn nasal spray more regularly during the allergy season We discussed temporarily starting another stomach acid medication to decrease the contribution of reflux to poor asthma control.  We will hold off for now but this may be helpful going forward when you are having flaring symptoms. Please follow with APP or Dr. Melvyn Novas in 2  weeks to assess your progress   Time spent 40 minutes  Baltazar Apo, MD, PhD 05/18/2021, 12:17 PM [facility] [phone] or if no answer before 7:00PM call [phone] For any issues after 7:00PM please call eLink [phone]

## 2021-05-18 NOTE — Assessment & Plan Note (Signed)
She is having acute flaring symptoms, most of which appear to be upper airway in nature based on exam.  May be starting to improve some after the initiation of prednisone.  I talked to her about managing rhinitis more aggressively especially now that it is allergy season.  Also talked to her about temporarily adding a PPI to tamp down any contribution of her known GERD.  She wants to avoid for now because she is worried about side effects.  She is no longer on Perforomist, and is on Symbicort.  Plan to complete the prednisone taper and have her follow-up to assess improvement. ? ? ? ?Please continue your Symbicort 2 puffs twice a day.  Rinse and gargle after using. ?Keep your albuterol available to use 2 puffs up to every 4 hours if needed for shortness of breath, chest tightness, wheezing. ?Complete your prednisone taper as directed until completely gone. ?Continue Zyrtec once daily ?Start using your cromolyn nasal spray more regularly during the allergy season ?We discussed temporarily starting another stomach acid medication to decrease the contribution of reflux to poor asthma control.  We will hold off for now but this may be helpful going forward when you are having flaring symptoms. ?Please follow with APP or Dr. Melvyn Novas in 2 weeks to assess your progress ?

## 2021-05-18 NOTE — Patient Instructions (Addendum)
Please continue your Symbicort 2 puffs twice a day.  Rinse and gargle after using. ?Keep your albuterol available to use 2 puffs up to every 4 hours if needed for shortness of breath, chest tightness, wheezing. ?Complete your prednisone taper as directed until completely gone. ?Continue Zyrtec once daily ?Start using your cromolyn nasal spray more regularly during the allergy season ?We discussed temporarily starting another stomach acid medication to decrease the contribution of reflux to poor asthma control.  We will hold off for now but this may be helpful going forward when you are having flaring symptoms. ?Please follow with APP or Dr. Melvyn Novas in 2 weeks to assess your progress ? ?

## 2021-05-23 ENCOUNTER — Ambulatory Visit (HOSPITAL_BASED_OUTPATIENT_CLINIC_OR_DEPARTMENT_OTHER): Payer: Medicare Other | Admitting: Physical Therapy

## 2021-05-25 ENCOUNTER — Encounter (HOSPITAL_BASED_OUTPATIENT_CLINIC_OR_DEPARTMENT_OTHER): Payer: Self-pay | Admitting: Physical Therapy

## 2021-05-25 ENCOUNTER — Other Ambulatory Visit: Payer: Self-pay

## 2021-05-25 ENCOUNTER — Ambulatory Visit (HOSPITAL_BASED_OUTPATIENT_CLINIC_OR_DEPARTMENT_OTHER): Payer: Medicare Other | Admitting: Physical Therapy

## 2021-05-25 DIAGNOSIS — R262 Difficulty in walking, not elsewhere classified: Secondary | ICD-10-CM | POA: Diagnosis not present

## 2021-05-25 DIAGNOSIS — R293 Abnormal posture: Secondary | ICD-10-CM | POA: Diagnosis not present

## 2021-05-25 NOTE — Therapy (Signed)
?OUTPATIENT PHYSICAL THERAPY TREATMENT ? ? ?Patient Name: Sonia [name] ?MRN: [id number] ?DOB:[birth year], 81 y.o., female ?Today's Date: 05/25/2021 ? ? PT End of Session - 05/25/21 1309   ? ? Visit Number 7   ? Number of Visits 25   ? Date for PT Re-Evaluation 07/24/21   ? Authorization Type MCR   ? PT Start Time 7829   ? PT Stop Time 1340   ? PT Time Calculation (min) 35 min   ? Activity Tolerance Patient tolerated treatment well   ? Behavior During Therapy [facility] for tasks assessed/performed   ? ?  ?  ? ?  ? ? ? ? ? ? ? ? ?Past Medical History:  ?Diagnosis Date  ? Arthritis   ? Asthma   ? daily and prn inhalers; states good control currently  ? CAD S/P percutaneous coronary angioplasty 07/15/2016  ? COPD (chronic obstructive pulmonary disease) ([place])   ? GERD (gastroesophageal reflux disease)   ? no current med.  ? GI bleed 07/25/2016  ? Brilinta changed to Plavix  ? Hashimoto's thyroiditis   ? Headache   ? allergies; silent migraines  ? Hiatal hernia   ? Hypothyroidism   ? Malignant melanoma ([place]) 08/20/2019  ? Left Posterior Neck (in situ) exc  ? Pneumonia   ? 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 Vitamin D deficiency   ? ?Past Surgical History:  ?Procedure Laterality Date  ? ABDOMINAL HYSTERECTOMY    ? partial  ? APPENDECTOMY    ? BREAST BIOPSY Left   ? BREAST CYST EXCISION Left   ? BREAST EXCISIONAL BIOPSY Left 03/2015  ? BREAST LUMPECTOMY Right 08/2018  ? BREAST LUMPECTOMY WITH RADIOACTIVE SEED LOCALIZATION Left 04/07/2015  ? Procedure: BREAST LUMPECTOMY WITH RADIOACTIVE SEED LOCALIZATION;  Surgeon: Erroll Luna, MD;  Location: [place];  Service: General;  Laterality: Left;  ? BREAST LUMPECTOMY WITH RADIOACTIVE SEED LOCALIZATION Right 08/13/2018  ? Procedure: RIGHT BREAST LUMPECTOMY WITH RADIOACTIVE SEED LOCALIZATION;  Surgeon: Rolm Bookbinder, MD;  Location: [place];  Service: General;  Laterality: Right;  ? CORONARY STENT INTERVENTION N/A 07/15/2016  ?  Procedure: Coronary Stent Intervention;  Surgeon: Sherren Mocha, MD;  Location: [place] [facility];  Service: Cardiovascular;  Laterality: N/A;  ? ESOPHAGOGASTRODUODENOSCOPY (EGD) WITH PROPOFOL Left 07/25/2016  ? Procedure: ESOPHAGOGASTRODUODENOSCOPY (EGD) WITH PROPOFOL;  Surgeon: Ronnette Juniper, MD;  Location: [place];  Service: Gastroenterology;  Laterality: Left;  ? KNEE ARTHROSCOPY Right 12/07/2003  ? LAPAROSCOPIC SALPINGO OOPHERECTOMY Right 09/08/2003  ? LEFT HEART CATH N/A 07/15/2016  ? Procedure: Left Heart Cath;  Surgeon: Sherren Mocha, MD;  Location: [place] [facility];  Service: Cardiovascular;  Laterality: N/A;  ? LEFT HEART CATH AND CORONARY ANGIOGRAPHY N/A 04/14/2019  ? Procedure: LEFT HEART CATH AND CORONARY ANGIOGRAPHY;  Surgeon: Burnell Blanks, MD;  Location: [place] [facility];  Service: Cardiovascular;  Laterality: N/A;  ? LYSIS OF ADHESION  09/08/2003  ? NASAL SINUS SURGERY  age 60  ? RECTAL POLYPECTOMY  10/02/2006  ? ?Patient Active Problem List  ? Diagnosis Date Noted  ? Coronary artery disease of native artery of native heart with stable angina pectoris ([place])   ? Asthma, chronic, moderate persistent, uncomplicated 56/21/3086  ? DOE (dyspnea on exertion) 07/31/2018  ? Osteopenia 06/04/2018  ? Carcinoma of upper-outer quadrant of right breast in female, estrogen receptor positive ([place]) 05/30/2018  ? GI bleed 07/25/2016  ? Melena 07/24/2016  ? Acute renal insufficiency 07/24/2016  ?  CAD S/P percutaneous coronary angioplasty 07/16/2016  ? NSTEMI (non-ST elevated myocardial infarction) ([place]) 07/13/2016  ? Severe asthma with acute exacerbation 03/07/2016  ? Genetic testing 06/09/2015  ? Family history of breast cancer in female 05/10/2015  ? History of melanoma 05/10/2015  ? Atypical lobular hyperplasia of left breast 04/26/2015  ? Vision problem 01/08/2014  ? Hiatus hernia syndrome 08/01/2012  ? Palpitations 10/18/2010  ? Acute chest pain 10/18/2010  ? Hypothyroidism 10/18/2010  ? Dyslipidemia  06/07/2009  ? Upper airway cough syndrome 03/29/2009  ? COPD with asthma ([place]) 12/05/2006  ? GERD 12/05/2006  ? ? ?PCP: Glenis Smoker, MD ? ?REFERRING PROVIDER: Glenis Smoker, MD  ? ?REFERRING DIAG: R26.89 (ICD-10-CM) - Other abnormalities of gait and mobility  ? ?THERAPY DIAG:  ?Difficulty in walking, not elsewhere classified ? ?Abnormal posture ? ?ONSET DATE: about 2 years ago- when COVID shut down began ? ?SUBJECTIVE:  ? ?SUBJECTIVE STATEMENT: ?Pt states she had an asthma episode and needed to cancel last session. Pt required a full day of rest in order to recover. She has started prednisone.  ? ?PERTINENT HISTORY: ?asthma ?PAIN:  ?Are you having pain? No ? ? ?LIVING ENVIRONMENT: ?Lives with: lives with their family, dog ?Lives in: House/apartment ?Stairs: No;  ? ? ?OCCUPATION: retired Marine scientist ? ?PLOF: Independent ? ?PATIENT GOALS: Improve endurance and would like to be able to walk more, be able to do more daily functions/chores without being tired ? ? ?OBJECTIVE:  ?FOTO  ? ?44 at eval ?52 at D/C ?6 pts MCII ? ?6th Visit FOTO 3/6: 42pts ? ? ?FUNCTIONAL TESTS:  ?Previous: ?5 times sit to stand: 13s ?6 minute walk test: 1088f  no breaks needed (97% O2 saturation)  ? 5TSTS did not incr breathing significantly ? ?TODAY'S TREATMENT: ? ?Recumbent bike L1 10 min  ?Green TB rowing 4x10 ?Shuttle leg press 50 lbs 3x10 ?Seated LAQ 4lbs each 2x10 ?Standing march 20x 3 4lbs ?Seated flexion lumbar stretch 5s 10x ? ?Previous: ?UBE L1 1 min retro and fwd; 149m off; 4x ? ?4m73mrest breaks: ?Farmer's carry 10lbs in each hand 1 hallway length 4x laps ?Sit to Stand YTB at knees 5lb KB 3x10 ?Standing HR/TR 20x ? ?Circuit: 30s rest 3x rounds ?Blue TB rowing 3x10 ?Standing march 30x ? ?PATIENT EDUCATION:  ?Education details:  anatomy, exercise progression, DOMS expectations, muscle firing,  envelope of function, HEP, POC ?Person educated: Patient ?Education method: Explanation, Demonstration, Tactile cues, Verbal cues,  and Handouts ?Education comprehension: verbalized understanding, returned demonstration, verbal cues required, tactile cues required, and needs further education ? ? ?HOME EXERCISE PROGRAM: ?B6DHEAPR ? ? ?ASSESSMENT: ? ?CLINICAL IMPRESSION: ?Pt exercise scaled back today due to increase in fatigue and SOB/asthma. Pt given frequent rest breaks due to SOB and fatigue. Plan to continue as tolerated. Per last MD note, allergy season will affect pt's asthmatic episodes. Plan for progress note at visit 10. Possibly transition to personal training if appropriate, given no dangerous ranges in vitals. ? ?Objective impairments include decreased activity tolerance, decreased endurance, difficulty walking, decreased strength, increased muscle spasms, impaired flexibility, and postural dysfunction. These impairments are limiting patient from cleaning, community activity, and lives independently- has difficulty with endurance to complete ADLs . Personal factors including Fitness, Time since onset of injury/illness/exacerbation, and 3+ comorbidities: h/o breast CA, pneumonia, asthma  are also affecting patient's functional outcome. Patient will benefit from skilled PT to address above impairments and improve overall function. We stood and talked in the aquatics facility to  ensure that pt would tolerate temp and humidity and she verbalized readiness for aquatic treatments.  ? ?REHAB POTENTIAL: Good ? ?CLINICAL DECISION MAKING: Unstable/unpredictable ? ?EVALUATION COMPLEXITY: High ? ? ?GOALS: ? ? ?SHORT TERM GOALS: ? ?STG Name Target Date Goal status  ?1 Pt will become independent with HEP in order to demonstrate synthesis of PT education. ? 07/06/2021 ? INITIAL  ? ?LONG TERM GOALS:  ? ?LTG Name Target Date Goal status  ?1 Pt  will become independent with final HEP in order to demonstrate synthesis of PT education. ? ? 08/17/2021 ? INITIAL  ?2 Pt will be able to perform 5XSTS in under 12s  in order to demonstrate functional improvement  above the cut off score for adults. ? 07/18/2021 INITIAL  ?3 Pt will be able to walk at least 3280 ft or 1050mduring 6MWT in order to demonstrate improvement in functional endurance and mobility for decreas

## 2021-05-29 ENCOUNTER — Other Ambulatory Visit: Payer: Self-pay | Admitting: Internal Medicine

## 2021-05-29 ENCOUNTER — Telehealth: Payer: Self-pay | Admitting: Internal Medicine

## 2021-05-29 MED ORDER — ALBUTEROL SULFATE (2.5 MG/3ML) 0.083% IN NEBU: 2.5000 mg | INHALATION_SOLUTION | Freq: Four times a day (QID) | RESPIRATORY_TRACT | 11 refills | Status: DC | PRN

## 2021-05-29 NOTE — Telephone Encounter (Signed)
Spoke with pt and verified pharmacy and medication of refill. Pt stated she needed albuterol nebulizer solation sent [facility] on [address]. Refill sent. Nothing further needed at this time.   ?

## 2021-05-30 ENCOUNTER — Encounter (HOSPITAL_BASED_OUTPATIENT_CLINIC_OR_DEPARTMENT_OTHER): Payer: Self-pay | Admitting: Physical Therapy

## 2021-05-30 ENCOUNTER — Ambulatory Visit (HOSPITAL_BASED_OUTPATIENT_CLINIC_OR_DEPARTMENT_OTHER): Payer: Medicare Other | Admitting: Physical Therapy

## 2021-05-30 ENCOUNTER — Other Ambulatory Visit: Payer: Self-pay

## 2021-05-30 DIAGNOSIS — R262 Difficulty in walking, not elsewhere classified: Secondary | ICD-10-CM | POA: Diagnosis not present

## 2021-05-30 DIAGNOSIS — R293 Abnormal posture: Secondary | ICD-10-CM | POA: Diagnosis not present

## 2021-05-30 NOTE — Therapy (Signed)
?OUTPATIENT PHYSICAL THERAPY TREATMENT ? ? ?Patient Name: Sonia [name] ?MRN: [id number] ?DOB:1940/04/19, 81 y.o., female ?Today's Date: 05/30/2021 ? ? PT End of Session - 05/30/21 1350   ? ? Visit Number 8   ? Number of Visits 25   ? Date for PT Re-Evaluation 07/24/21   ? Authorization Type MCR   ? PT Start Time 1346   ? PT Stop Time 1425   ? PT Time Calculation (min) 39 min   ? Activity Tolerance Patient tolerated treatment well   ? Behavior During Therapy [facility] for tasks assessed/performed   ? ?  ?  ? ?  ? ? ? ? ? ? ? ? ? ?Past Medical History:  ?Diagnosis Date  ? Arthritis   ? Asthma   ? daily and prn inhalers; states good control currently  ? CAD S/P percutaneous coronary angioplasty 07/15/2016  ? COPD (chronic obstructive pulmonary disease) ([place])   ? GERD (gastroesophageal reflux disease)   ? no current med.  ? GI bleed 07/25/2016  ? Brilinta changed to Plavix  ? Hashimoto's thyroiditis   ? Headache   ? allergies; silent migraines  ? Hiatal hernia   ? Hypothyroidism   ? Malignant melanoma ([place]) 08/20/2019  ? Left Posterior Neck (in situ) exc  ? Pneumonia   ? 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 Vitamin D deficiency   ? ?Past Surgical History:  ?Procedure Laterality Date  ? ABDOMINAL HYSTERECTOMY    ? partial  ? APPENDECTOMY    ? BREAST BIOPSY Left   ? BREAST CYST EXCISION Left   ? BREAST EXCISIONAL BIOPSY Left 03/2015  ? BREAST LUMPECTOMY Right 08/2018  ? BREAST LUMPECTOMY WITH RADIOACTIVE SEED LOCALIZATION Left 04/07/2015  ? Procedure: BREAST LUMPECTOMY WITH RADIOACTIVE SEED LOCALIZATION;  Surgeon: Erroll Luna, MD;  Location: [place];  Service: General;  Laterality: Left;  ? BREAST LUMPECTOMY WITH RADIOACTIVE SEED LOCALIZATION Right 08/13/2018  ? Procedure: RIGHT BREAST LUMPECTOMY WITH RADIOACTIVE SEED LOCALIZATION;  Surgeon: Rolm Bookbinder, MD;  Location: [place];  Service: General;  Laterality: Right;  ? CORONARY STENT INTERVENTION N/A 07/15/2016  ?  Procedure: Coronary Stent Intervention;  Surgeon: Sherren Mocha, MD;  Location: [place] [facility];  Service: Cardiovascular;  Laterality: N/A;  ? ESOPHAGOGASTRODUODENOSCOPY (EGD) WITH PROPOFOL Left 07/25/2016  ? Procedure: ESOPHAGOGASTRODUODENOSCOPY (EGD) WITH PROPOFOL;  Surgeon: Ronnette Juniper, MD;  Location: [place];  Service: Gastroenterology;  Laterality: Left;  ? KNEE ARTHROSCOPY Right 12/07/2003  ? LAPAROSCOPIC SALPINGO OOPHERECTOMY Right 09/08/2003  ? LEFT HEART CATH N/A 07/15/2016  ? Procedure: Left Heart Cath;  Surgeon: Sherren Mocha, MD;  Location: [place] [facility];  Service: Cardiovascular;  Laterality: N/A;  ? LEFT HEART CATH AND CORONARY ANGIOGRAPHY N/A 04/14/2019  ? Procedure: LEFT HEART CATH AND CORONARY ANGIOGRAPHY;  Surgeon: Burnell Blanks, MD;  Location: [place] [facility];  Service: Cardiovascular;  Laterality: N/A;  ? LYSIS OF ADHESION  09/08/2003  ? NASAL SINUS SURGERY  age 34  ? RECTAL POLYPECTOMY  10/02/2006  ? ?Patient Active Problem List  ? Diagnosis Date Noted  ? Coronary artery disease of native artery of native heart with stable angina pectoris ([place])   ? Asthma, chronic, moderate persistent, uncomplicated 42/68/3419  ? DOE (dyspnea on exertion) 07/31/2018  ? Osteopenia 06/04/2018  ? Carcinoma of upper-outer quadrant of right breast in female, estrogen receptor positive ([place]) 05/30/2018  ? GI bleed 07/25/2016  ? Melena 07/24/2016  ? Acute renal insufficiency 07/24/2016  ?  CAD S/P percutaneous coronary angioplasty 07/16/2016  ? NSTEMI (non-ST elevated myocardial infarction) ([place]) 07/13/2016  ? Severe asthma with acute exacerbation 03/07/2016  ? Genetic testing 06/09/2015  ? Family history of breast cancer in female 05/10/2015  ? History of melanoma 05/10/2015  ? Atypical lobular hyperplasia of left breast 04/26/2015  ? Vision problem 01/08/2014  ? Hiatus hernia syndrome 08/01/2012  ? Palpitations 10/18/2010  ? Acute chest pain 10/18/2010  ? Hypothyroidism 10/18/2010  ? Dyslipidemia  06/07/2009  ? Upper airway cough syndrome 03/29/2009  ? COPD with asthma ([place]) 12/05/2006  ? GERD 12/05/2006  ? ? ?PCP: Glenis Smoker, MD ? ?REFERRING PROVIDER: Glenis Smoker, MD  ? ?REFERRING DIAG: R26.89 (ICD-10-CM) - Other abnormalities of gait and mobility  ? ?THERAPY DIAG:  ?Difficulty in walking, not elsewhere classified ? ?Abnormal posture ? ?ONSET DATE: about 2 years ago- when COVID shut down began ? ?SUBJECTIVE:  ? ?SUBJECTIVE STATEMENT: ?Pt states that she was tired after last session but no adverse effects.  ? ?PERTINENT HISTORY: ?asthma ?PAIN:  ?Are you having pain? No ? ? ?LIVING ENVIRONMENT: ?Lives with: lives with their family, dog ?Lives in: House/apartment ?Stairs: No;  ? ? ?OCCUPATION: retired Marine scientist ? ?PLOF: Independent ? ?PATIENT GOALS: Improve endurance and would like to be able to walk more, be able to do more daily functions/chores without being tired ? ? ?OBJECTIVE:  ?FOTO  ? ?44 at eval ?52 at D/C ?6 pts MCII ? ?6th Visit FOTO 3/6: 42pts ? ? ?FUNCTIONAL TESTS:  ?Previous: ?5 times sit to stand: 13s ?6 minute walk test: 1016f  no breaks needed (97% O2 saturation)  ? 5TSTS did not incr breathing significantly ? ? ?TODAY'S TREATMENT: ? ?Recumbent bike L1 10 min  ? ?STS 5x5  ? ?blue TB rowing 4x10 ?Standing march 20x 3 4lbs ?Farmer's carry 10lbs in each hand 1 hallway length 4x laps ? ?UBE L1 1 min retro and fwd; 115m off; 4x ? ?Previous: ?UBE L1 1 min retro and fwd; 31m32moff; 4x ? ?31mi65mest breaks: ?Farmer's carry 10lbs in each hand 1 hallway length 4x laps ?Sit to Stand YTB at knees 5lb KB 3x10 ?Standing HR/TR 20x ? ?Circuit: 30s rest 3x rounds ?Blue TB rowing 3x10 ?Standing march 30x ? ?PATIENT EDUCATION:  ?Education details:  anatomy, exercise progression, DOMS expectations, muscle firing,  envelope of function, HEP, POC ?Person educated: Patient ?Education method: Explanation, Demonstration, Tactile cues, Verbal cues, and Handouts ?Education comprehension: verbalized  understanding, returned demonstration, verbal cues required, tactile cues required, and needs further education ? ? ?HOME EXERCISE PROGRAM: ?B6DHEAPR ? ? ?ASSESSMENT: ? ?CLINICAL IMPRESSION: ?Pt exercise reduced volume today due to increase in fatigue. Pt required more frequent rest breaks due to SOB. Pt is limited with more functional compound movements vs OKC/isolation exercise as expected. Pt vitals within safe limits during session. SPO2 did not drop below 95%, HR did not exceed 110 during session. Plan to continue with conditioning and strength as tolerated.  ? ?Objective impairments include decreased activity tolerance, decreased endurance, difficulty walking, decreased strength, increased muscle spasms, impaired flexibility, and postural dysfunction. These impairments are limiting patient from cleaning, community activity, and lives independently- has difficulty with endurance to complete ADLs . Personal factors including Fitness, Time since onset of injury/illness/exacerbation, and 3+ comorbidities: h/o breast CA, pneumonia, asthma  are also affecting patient's functional outcome. Patient will benefit from skilled PT to address above impairments and improve overall function. We stood and talked in the aquatics facility to  ensure that pt would tolerate temp and humidity and she verbalized readiness for aquatic treatments.  ? ?REHAB POTENTIAL: Good ? ?CLINICAL DECISION MAKING: Unstable/unpredictable ? ?EVALUATION COMPLEXITY: High ? ? ?GOALS: ? ? ?SHORT TERM GOALS: ? ?STG Name Target Date Goal status  ?1 Pt will become independent with HEP in order to demonstrate synthesis of PT education. ? 07/11/2021 ? INITIAL  ? ?LONG TERM GOALS:  ? ?LTG Name Target Date Goal status  ?1 Pt  will become independent with final HEP in order to demonstrate synthesis of PT education. ? ? 08/22/2021 ? INITIAL  ?2 Pt will be able to perform 5XSTS in under 12s  in order to demonstrate functional improvement above the cut off score for  adults. ? 07/18/2021 INITIAL  ?3 Pt will be able to walk at least 3280 ft or 1081mduring 6MWT in order to demonstrate improvement in functional endurance and mobility for decrease in all cause mortality.   5/9/2

## 2021-06-01 ENCOUNTER — Other Ambulatory Visit: Payer: Self-pay

## 2021-06-01 ENCOUNTER — Encounter (HOSPITAL_BASED_OUTPATIENT_CLINIC_OR_DEPARTMENT_OTHER): Payer: Self-pay | Admitting: Physical Therapy

## 2021-06-01 ENCOUNTER — Ambulatory Visit (HOSPITAL_BASED_OUTPATIENT_CLINIC_OR_DEPARTMENT_OTHER): Payer: Medicare Other | Admitting: Physical Therapy

## 2021-06-01 DIAGNOSIS — R262 Difficulty in walking, not elsewhere classified: Secondary | ICD-10-CM | POA: Diagnosis not present

## 2021-06-01 DIAGNOSIS — R293 Abnormal posture: Secondary | ICD-10-CM

## 2021-06-01 NOTE — Therapy (Signed)
?OUTPATIENT PHYSICAL THERAPY TREATMENT ? ? ?Patient Name: Sonia [name] ?MRN: [id number] ?DOB:21-Jun-1940, 81 y.o., female ?Today's Date: 06/01/2021 ? ? PT End of Session - 06/01/21 1349   ? ? Visit Number 9   ? Number of Visits 25   ? Date for PT Re-Evaluation 07/24/21   ? Authorization Type MCR   ? PT Start Time 1345   ? PT Stop Time 1410   ? PT Time Calculation (min) 25 min   ? Activity Tolerance Patient tolerated treatment well   ? Behavior During Therapy [facility] for tasks assessed/performed   ? ?  ?  ? ?  ? ? ? ? ? ? ? ? ? ? ?Past Medical History:  ?Diagnosis Date  ? Arthritis   ? Asthma   ? daily and prn inhalers; states good control currently  ? CAD S/P percutaneous coronary angioplasty 07/15/2016  ? COPD (chronic obstructive pulmonary disease) ([place])   ? GERD (gastroesophageal reflux disease)   ? no current med.  ? GI bleed 07/25/2016  ? Brilinta changed to Plavix  ? Hashimoto's thyroiditis   ? Headache   ? allergies; silent migraines  ? Hiatal hernia   ? Hypothyroidism   ? Malignant melanoma ([place]) 08/20/2019  ? Left Posterior Neck (in situ) exc  ? Pneumonia   ? 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 Vitamin D deficiency   ? ?Past Surgical History:  ?Procedure Laterality Date  ? ABDOMINAL HYSTERECTOMY    ? partial  ? APPENDECTOMY    ? BREAST BIOPSY Left   ? BREAST CYST EXCISION Left   ? BREAST EXCISIONAL BIOPSY Left 03/2015  ? BREAST LUMPECTOMY Right 08/2018  ? BREAST LUMPECTOMY WITH RADIOACTIVE SEED LOCALIZATION Left 04/07/2015  ? Procedure: BREAST LUMPECTOMY WITH RADIOACTIVE SEED LOCALIZATION;  Surgeon: Erroll Luna, MD;  Location: [facility];  Service: General;  Laterality: Left;  ? BREAST LUMPECTOMY WITH RADIOACTIVE SEED LOCALIZATION Right 08/13/2018  ? Procedure: RIGHT BREAST LUMPECTOMY WITH RADIOACTIVE SEED LOCALIZATION;  Surgeon: Rolm Bookbinder, MD;  Location: [facility];  Service: General;  Laterality: Right;  ? CORONARY STENT INTERVENTION N/A 07/15/2016  ?  Procedure: Coronary Stent Intervention;  Surgeon: Sherren Mocha, MD;  Location: [facility];  Service: Cardiovascular;  Laterality: N/A;  ? ESOPHAGOGASTRODUODENOSCOPY (EGD) WITH PROPOFOL Left 07/25/2016  ? Procedure: ESOPHAGOGASTRODUODENOSCOPY (EGD) WITH PROPOFOL;  Surgeon: Ronnette Juniper, MD;  Location: [facility];  Service: Gastroenterology;  Laterality: Left;  ? KNEE ARTHROSCOPY Right 12/07/2003  ? LAPAROSCOPIC SALPINGO OOPHERECTOMY Right 09/08/2003  ? LEFT HEART CATH N/A 07/15/2016  ? Procedure: Left Heart Cath;  Surgeon: Sherren Mocha, MD;  Location: [facility];  Service: Cardiovascular;  Laterality: N/A;  ? LEFT HEART CATH AND CORONARY ANGIOGRAPHY N/A 04/14/2019  ? Procedure: LEFT HEART CATH AND CORONARY ANGIOGRAPHY;  Surgeon: Burnell Blanks, MD;  Location: [facility];  Service: Cardiovascular;  Laterality: N/A;  ? LYSIS OF ADHESION  09/08/2003  ? NASAL SINUS SURGERY  age 54  ? RECTAL POLYPECTOMY  10/02/2006  ? ?Patient Active Problem List  ? Diagnosis Date Noted  ? Coronary artery disease of native artery of native heart with stable angina pectoris ([place])   ? Asthma, chronic, moderate persistent, uncomplicated 93/81/0175  ? DOE (dyspnea on exertion) 07/31/2018  ? Osteopenia 06/04/2018  ? Carcinoma of upper-outer quadrant of right breast in female, estrogen receptor positive ([place]) 05/30/2018  ? GI bleed 07/25/2016  ? Melena 07/24/2016  ? Acute renal insufficiency 07/24/2016  ?  CAD S/P percutaneous coronary angioplasty 07/16/2016  ? NSTEMI (non-ST elevated myocardial infarction) ([place]) 07/13/2016  ? Severe asthma with acute exacerbation 03/07/2016  ? Genetic testing 06/09/2015  ? Family history of breast cancer in female 05/10/2015  ? History of melanoma 05/10/2015  ? Atypical lobular hyperplasia of left breast 04/26/2015  ? Vision problem 01/08/2014  ? Hiatus hernia syndrome 08/01/2012  ? Palpitations 10/18/2010  ? Acute chest pain 10/18/2010  ? Hypothyroidism 10/18/2010  ? Dyslipidemia  06/07/2009  ? Upper airway cough syndrome 03/29/2009  ? COPD with asthma ([place]) 12/05/2006  ? GERD 12/05/2006  ? ? ?PCP: Glenis Smoker, MD ? ?REFERRING PROVIDER: Glenis Smoker, MD  ? ?REFERRING DIAG: R26.89 (ICD-10-CM) - Other abnormalities of gait and mobility  ? ?THERAPY DIAG:  ?Difficulty in walking, not elsewhere classified ? ?Abnormal posture ? ?ONSET DATE: about 2 years ago- when COVID shut down began ? ?SUBJECTIVE:  ? ?SUBJECTIVE STATEMENT: ?Pt states her chest is feeling tighter today. She had had difficulty catching her breath with the new warmer weather and increase in humidity. She had a poor nights sleep. She did not have adverse effects after last session but feels very fatigued today. She has taken two inhaler treatments today prior to this session.  ? ?PERTINENT HISTORY: ?asthma ?PAIN:  ?Are you having pain? No ? ? ?LIVING ENVIRONMENT: ?Lives with: lives with their family, dog ?Lives in: House/apartment ?Stairs: No;  ? ? ?OCCUPATION: retired Marine scientist ? ?PLOF: Independent ? ?PATIENT GOALS: Improve endurance and would like to be able to walk more, be able to do more daily functions/chores without being tired ? ? ?OBJECTIVE:  ?FOTO  ? ?44 at eval ?52 at D/C ?6 pts MCII ? ?6th Visit FOTO 3/6: 42pts ? ? ?FUNCTIONAL TESTS:  ?Previous: ?5 times sit to stand: 13s ?6 minute walk test: 1043f  no breaks needed (97% O2 saturation)  ? 5TSTS did not incr breathing significantly ? ? ?TODAY'S TREATMENT: ? ?Recumbent bike L1 10 min  ? ?STS 5x5  ? ?Previous: ?UBE L1 1 min retro and fwd; 118m off; 4x ? ?88m68mrest breaks: ?Farmer's carry 10lbs in each hand 1 hallway length 4x laps ?Sit to Stand YTB at knees 5lb KB 3x10 ?Standing HR/TR 20x ? ?Circuit: 30s rest 3x rounds ?Blue TB rowing 3x10 ?Standing march 30x ? ?PATIENT EDUCATION:  ?Education details:  anatomy, exercise progression, DOMS expectations, muscle firing,  envelope of function, HEP, POC ?Person educated: Patient ?Education method: Explanation,  Demonstration, Tactile cues, Verbal cues, and Handouts ?Education comprehension: verbalized understanding, returned demonstration, verbal cues required, tactile cues required, and needs further education ? ? ?HOME EXERCISE PROGRAM: ?[id number] ? ? ?ASSESSMENT: ? ?CLINICAL IMPRESSION: ?Pt session ended early due to report of increased chest tightness and difficulty with recovery between exercise sets. Pt vitals within safe limits during session. SPO2 did not drop below 95%, HR did not exceed 105 during session. Pt unable to meaningfully continue with remainder of therapy session. Plan to transition to aquatic therapy environment, per pt request, in cold pool due to land based endurance deficits.  ? ?Objective impairments include decreased activity tolerance, decreased endurance, difficulty walking, decreased strength, increased muscle spasms, impaired flexibility, and postural dysfunction. These impairments are limiting patient from cleaning, community activity, and lives independently- has difficulty with endurance to complete ADLs . Personal factors including Fitness, Time since onset of injury/illness/exacerbation, and 3+ comorbidities: h/o breast CA, pneumonia, asthma  are also affecting patient's functional outcome. Patient will benefit from skilled PT to  address above impairments and improve overall function. We stood and talked in the aquatics facility to ensure that pt would tolerate temp and humidity and she verbalized readiness for aquatic treatments.  ? ?REHAB POTENTIAL: Good ? ?CLINICAL DECISION MAKING: Unstable/unpredictable ? ?EVALUATION COMPLEXITY: High ? ? ?GOALS: ? ? ?SHORT TERM GOALS: ? ?STG Name Target Date Goal status  ?1 Pt will become independent with HEP in order to demonstrate synthesis of PT education. ? 07/13/2021 ? INITIAL  ? ?LONG TERM GOALS:  ? ?LTG Name Target Date Goal status  ?1 Pt  will become independent with final HEP in order to demonstrate synthesis of PT education. ? ? 08/24/2021 ?  INITIAL  ?2 Pt will be able to perform 5XSTS in under 12s  in order to demonstrate functional improvement above the cut off score for adults. ? 07/18/2021 INITIAL  ?3 Pt will be able to walk at least 3280 ft o

## 2021-06-02 ENCOUNTER — Encounter (HOSPITAL_COMMUNITY): Payer: Self-pay | Admitting: Emergency Medicine

## 2021-06-02 ENCOUNTER — Emergency Department (HOSPITAL_COMMUNITY): Payer: Medicare Other

## 2021-06-02 ENCOUNTER — Ambulatory Visit (INDEPENDENT_AMBULATORY_CARE_PROVIDER_SITE_OTHER): Payer: Medicare Other

## 2021-06-02 ENCOUNTER — Other Ambulatory Visit: Payer: Medicare Other

## 2021-06-02 ENCOUNTER — Other Ambulatory Visit: Payer: Self-pay

## 2021-06-02 ENCOUNTER — Encounter: Payer: Self-pay | Admitting: Adult Health

## 2021-06-02 ENCOUNTER — Ambulatory Visit (INDEPENDENT_AMBULATORY_CARE_PROVIDER_SITE_OTHER): Payer: Medicare Other | Admitting: Adult Health

## 2021-06-02 ENCOUNTER — Emergency Department (HOSPITAL_COMMUNITY)
Admission: EM | Admit: 2021-06-02 | Discharge: 2021-06-03 | Disposition: A | Discharge status: Home / Self Care | Payer: Medicare Other | Attending: Emergency Medicine | Admitting: Emergency Medicine

## 2021-06-02 VITALS — BP 120/60 | HR 108 | Temp 98.1°F | Ht 60.0 in | Wt 143.0 lb

## 2021-06-02 DIAGNOSIS — J45901 Unspecified asthma with (acute) exacerbation: Secondary | ICD-10-CM

## 2021-06-02 DIAGNOSIS — R0609 Other forms of dyspnea: Secondary | ICD-10-CM | POA: Diagnosis not present

## 2021-06-02 DIAGNOSIS — R0602 Shortness of breath: Secondary | ICD-10-CM | POA: Diagnosis not present

## 2021-06-02 DIAGNOSIS — U071 COVID-19: Secondary | ICD-10-CM | POA: Diagnosis not present

## 2021-06-02 DIAGNOSIS — Z7951 Long term (current) use of inhaled steroids: Secondary | ICD-10-CM | POA: Diagnosis not present

## 2021-06-02 DIAGNOSIS — Z7952 Long term (current) use of systemic steroids: Secondary | ICD-10-CM | POA: Diagnosis not present

## 2021-06-02 DIAGNOSIS — J45909 Unspecified asthma, uncomplicated: Secondary | ICD-10-CM | POA: Diagnosis not present

## 2021-06-02 DIAGNOSIS — Z91040 Latex allergy status: Secondary | ICD-10-CM | POA: Insufficient documentation

## 2021-06-02 DIAGNOSIS — Z8616 Personal history of COVID-19: Secondary | ICD-10-CM | POA: Diagnosis not present

## 2021-06-02 DIAGNOSIS — Z20822 Contact with and (suspected) exposure to covid-19: Secondary | ICD-10-CM | POA: Diagnosis not present

## 2021-06-02 DIAGNOSIS — J4540 Moderate persistent asthma, uncomplicated: Secondary | ICD-10-CM | POA: Diagnosis not present

## 2021-06-02 DIAGNOSIS — R7989 Other specified abnormal findings of blood chemistry: Secondary | ICD-10-CM | POA: Diagnosis not present

## 2021-06-02 LAB — CBC WITH DIFFERENTIAL/PLATELET
Abs Immature Granulocytes: 0.03 10*3/uL (ref 0.00–0.07)
Basophils Absolute: 0.1 10*3/uL (ref 0.0–0.1)
Basophils Absolute: 0.1 10*3/uL (ref 0.0–0.1)
Basophils Relative: 1.3 % (ref 0.0–3.0)
Basophils Relative: 1 %
Eosinophils Absolute: 0.2 10*3/uL (ref 0.0–0.7)
Eosinophils Absolute: 0.3 10*3/uL (ref 0.0–0.5)
Eosinophils Relative: 2.1 % (ref 0.0–5.0)
Eosinophils Relative: 3 %
HCT: 44.3 % (ref 36.0–46.0)
HCT: 43.3 % (ref 36.0–46.0)
Hemoglobin: 14.6 g/dL (ref 12.0–15.0)
Hemoglobin: 14.1 g/dL (ref 12.0–15.0)
Immature Granulocytes: 0 %
Lymphocytes Relative: 21.7 % (ref 12.0–46.0)
Lymphocytes Relative: 33 %
Lymphs Abs: 1.9 10*3/uL (ref 0.7–4.0)
Lymphs Abs: 2.9 10*3/uL (ref 0.7–4.0)
MCH: 30.5 pg (ref 26.0–34.0)
MCHC: 32.9 g/dL (ref 30.0–36.0)
MCHC: 32.6 g/dL (ref 30.0–36.0)
MCV: 92.4 fl (ref 78.0–100.0)
MCV: 93.5 fL (ref 80.0–100.0)
Monocytes Absolute: 0.6 10*3/uL (ref 0.1–1.0)
Monocytes Absolute: 0.6 10*3/uL (ref 0.1–1.0)
Monocytes Relative: 7 % (ref 3.0–12.0)
Monocytes Relative: 6 %
Neutro Abs: 5.8 10*3/uL (ref 1.4–7.7)
Neutro Abs: 5 10*3/uL (ref 1.7–7.7)
Neutrophils Relative %: 67.9 % (ref 43.0–77.0)
Neutrophils Relative %: 57 %
Platelets: 161 10*3/uL (ref 150.0–400.0)
Platelets: 163 10*3/uL (ref 150–400)
RBC: 4.79 Mil/uL (ref 3.87–5.11)
RBC: 4.63 MIL/uL (ref 3.87–5.11)
RDW: 15 % (ref 11.5–15.5)
RDW: 14.7 % (ref 11.5–15.5)
WBC: 8.6 10*3/uL (ref 4.0–10.5)
WBC: 8.9 10*3/uL (ref 4.0–10.5)
nRBC: 0 % (ref 0.0–0.2)

## 2021-06-02 LAB — RESP PANEL BY RT-PCR (FLU A&B, COVID) ARPGX2
Influenza A by PCR: NEGATIVE
Influenza B by PCR: NEGATIVE
SARS Coronavirus 2 by RT PCR: POSITIVE — AB

## 2021-06-02 LAB — BASIC METABOLIC PANEL
Anion gap: 6 (ref 5–15)
BUN: 31 mg/dL — ABNORMAL HIGH (ref 6–23)
BUN: 30 mg/dL — ABNORMAL HIGH (ref 8–23)
CO2: 31 mEq/L (ref 19–32)
CO2: 26 mmol/L (ref 22–32)
Calcium: 9.1 mg/dL (ref 8.4–10.5)
Calcium: 8.8 mg/dL — ABNORMAL LOW (ref 8.9–10.3)
Chloride: 106 mEq/L (ref 96–112)
Chloride: 109 mmol/L (ref 98–111)
Creatinine, Ser: 1.22 mg/dL — ABNORMAL HIGH (ref 0.40–1.20)
Creatinine, Ser: 1.08 mg/dL — ABNORMAL HIGH (ref 0.44–1.00)
GFR, Estimated: 51 mL/min — ABNORMAL LOW (ref >60)
GFR: 41.28 mL/min — ABNORMAL LOW (ref >60.00)
Glucose, Bld: 93 mg/dL (ref 70–99)
Glucose, Bld: 106 mg/dL — ABNORMAL HIGH (ref 70–99)
Potassium: 4.4 mEq/L (ref 3.5–5.1)
Potassium: 4.3 mmol/L (ref 3.5–5.1)
Sodium: 141 mEq/L (ref 135–145)
Sodium: 141 mmol/L (ref 135–145)

## 2021-06-02 LAB — D-DIMER, QUANTITATIVE: D-Dimer, Quant: 0.6 mcg/mL FEU — ABNORMAL HIGH (ref <0.50)

## 2021-06-02 LAB — BRAIN NATRIURETIC PEPTIDE
B Natriuretic Peptide: 32.9 pg/mL (ref 0.0–100.0)
Brain Natriuretic Peptide: 39 pg/mL (ref <100)

## 2021-06-02 LAB — TROPONIN I (HIGH SENSITIVITY)
Troponin I (High Sensitivity): 7 ng/L (ref <18)
Troponin I (High Sensitivity): 7 ng/L (ref <18)

## 2021-06-02 LAB — NITRIC OXIDE: FeNO level (ppb): 57

## 2021-06-02 MED ORDER — DIPHENHYDRAMINE HCL 25 MG PO CAPS: 50.0000 mg | ORAL_CAPSULE | Freq: Once | ORAL | Status: AC

## 2021-06-02 MED ORDER — DIPHENHYDRAMINE HCL 50 MG/ML IJ SOLN
50.0000 mg | Freq: Once | INTRAMUSCULAR | Status: AC
  Administered 2021-06-03: 50 mg via INTRAVENOUS
  Filled 2021-06-02: qty 1

## 2021-06-02 MED ORDER — MONTELUKAST SODIUM 10 MG PO TABS: 10.0000 mg | ORAL_TABLET | Freq: Every day | ORAL | 11 refills | Status: DC

## 2021-06-02 MED ORDER — HYDROCORTISONE SOD SUC (PF) 100 MG IJ SOLR
200.0000 mg | Freq: Once | INTRAMUSCULAR | Status: AC
  Administered 2021-06-02: 200 mg via INTRAVENOUS
  Filled 2021-06-02: qty 4

## 2021-06-02 NOTE — ED Triage Notes (Signed)
Pt c/o increased shortness of breath and wheezing. Seen at her PCP earlier and sent here for an abnormal d-dimer and concern for PE. Pt reports hx asthma. ?

## 2021-06-02 NOTE — Progress Notes (Signed)
Called and spoke with patient, advised to go to the ER.  She is having wheezing and chest discomfort while we were on the phone.  She will go to [facility] ED.  I called the [facility] ED and spoke with McKenzie to let her know that the patient had been seen in the office, her d-dimer was elevated and the result came back to late to have her scheduled for an outpatient VQ scan.  I let her know that because she was having symptoms of sob and left chest discomfort, she was advised to go to the ED.  She verbalized understanding.  Nothing further needed.

## 2021-06-02 NOTE — Patient Instructions (Addendum)
Chest xray today.  ?Labs today ?Continue on Symbicort 2 puffs Twice daily, rinse after use.  ?Add Singulair 10 mg daily ?Continue on Zyrtec 10 mg daily ?Continue on Cromolyn nasal spray daily ?Albuterol inhaler or nebulizer as needed  ?Follow up in 2 weeks and As needed   ?Please contact office for sooner follow up if symptoms do not improve or worsen or seek emergency care  ? ?

## 2021-06-02 NOTE — ED Provider Triage Note (Signed)
Emergency Medicine Provider Triage Evaluation Note ? ?Sonia [name] , a 81 y.o. female  was evaluated in triage.  Pt complains of shortness of breath and left sided chest discomfort.  States she has been having trouble with shortness of breath for a while as she has a history of asthma.  She also had some left-sided chest discomfort that occurred yesterday.  She was seen by her pulmonologist who did a series of labs.  She is called today with a result of a positive D-dimer.  They sent her here to get a CTA chest or V/Q study.  She does have a contrast allergy.  Patient denies any chest pain today but she does have continued shortness of breath.  She also endorses a pain behind her left scapula.  She denies any new leg swelling, recent surgery, history of pulmonary embolism or DVT. ? ?Review of Systems  ?Positive: Chest pain, shortness of breath, back pain ?Negative:  ? ?Physical Exam  ?BP (!) 140/93   Pulse (!) 102   Temp 98.5 ?F (36.9 ?C) (Oral)   Resp 18   SpO2 96%  ?Gen:   Awake, no distress   ?Resp:  Normal effort  ?MSK:   Moves extremities without difficulty  ?Other:  Dyspnic at rest. Not on O2. Slightly tachycardic.  ? ?Medical Decision Making  ?Medically screening exam initiated at 6:56 PM.  Appropriate orders placed.  Sonia [name] was informed that the remainder of the evaluation will be completed by another provider, this initial triage assessment does not replace that evaluation, and the importance of remaining in the ED until their evaluation is complete. ? ?Ordered CTA chest per Pulmonology. Will have to be premedicated for contrast allergy. ?  Sheila Oats ?06/02/21 1857 ? ?

## 2021-06-02 NOTE — Progress Notes (Signed)
? ?'@Patient'$  ID: Sonia [name], female    DOB: May 20, 1940, 81 y.o.   MRN: [id number] ? ?Chief Complaint  ?Patient presents with  ? Follow-up  ? ? ?Referring provider: ?Glenis Smoker, * ? ?HPI: ?81 year old female never smoker followed for chronic obstructive asthma ?Medical history significant for GERD and large hiatal hernia ?COVID-19 infection late January 2023 ?History of CAD s/p stent  ? ?TEST/EVENTS :  ?Allergy panel Jul 31, 2018 negative except for mildly positive dust, IgE 11, absolute eosinophil count 100 ?Feno 07/2017 35  ? ?Left heart cath April 14, 2019 patent stent RCA, moderate mid LAD stenosis unchanged, normal LV systolic function. ? ?2D echo 2018 EF at 60 to 93%, grade 2 diastolic dysfunction, pulmonary artery pressure 37 mmHg. ? ?Pulmonary function testing 07/30/2011 reviewed by me showed moderate to severe obstruction without a bronchodilator response, normal lung volumes and normal diffusion capacity.  (FEV1 61%, ratio 47, FVC 92%, no significant bronchodilator response, DLCO 94%.  Mid flows very severe obstruction at 18%. ? ?06/02/2021 Follow up : Asthma  ?Patient returns for 2-week follow-up.  Patient was seen in the office earlier this month for an asthma exacerbation.  She was treated with a steroid taper.  She was continued on her maintenance regimen with Symbicort, ICS nasal spray and Zyrtec.  Patient says she is feeling okay with decreased congestion and wheezing .has nasal drainage that is clear .  Remains very short of breath with activities . RSV in late 2022, and Covid 19 infection in [place]  . Was not hospitalized. . Feels she is worse since then . Has decreased activity tolerance . Has had to start PT to gain her strength back.  ?FENO today is 57.  ?Gets tightness in chest , does not feel good most days.  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 Better now. Off steroids . Followed by Dr. Trudie Reed in Rheumatology .  ?No hemoptysis, calf pain, edema.  ?Does get  winded at night in her sleep at times.  ?Walk test in office today with O2 sats at on room air with no desats .  ? ? ?Lives alone . Drives. Has a dog.  ? ? ?Allergies  ?Allergen Reactions  ? Adenosine Anaphylaxis  ? Contrast Media [Iodinated Contrast Media] Anaphylaxis  ? Codeine Sulfate Nausea And Vomiting  ? Latex Other (See Comments)  ?  Skin irritation  ? ? ?Immunization History  ?Administered Date(s) Administered  ? Influenza Split 12/11/2010, 12/11/2011, 01/20/2013, 12/23/2014, 01/28/2017, 12/23/2017  ? Influenza Whole 01/10/2009  ? Influenza, High Dose Seasonal PF 01/29/2017, 12/23/2017  ? Influenza,inj,Quad PF,6+ Mos 12/11/2015  ? Influenza,inj,quad, With Preservative 02/09/2017  ? Influenza-Unspecified 12/11/2018  ? Pneumococcal Conjugate PCV 7 03/12/2016  ? Pneumococcal Conjugate-13 10/09/2013, 03/12/2014, 03/12/2016  ? Pneumococcal Polysaccharide-23 12/14/2002, 03/12/2006  ? Pneumococcal-Unspecified 03/12/2016  ? Td 03/12/2006  ? ? ?Past Medical History:  ?Diagnosis Date  ? Arthritis   ? Asthma   ? daily and prn inhalers; states good control currently  ? CAD S/P percutaneous coronary angioplasty 07/15/2016  ? COPD (chronic obstructive pulmonary disease) ([place])   ? GERD (gastroesophageal reflux disease)   ? no current med.  ? GI bleed 07/25/2016  ? Brilinta changed to Plavix  ? Hashimoto's thyroiditis   ? Headache   ? allergies; silent migraines  ? Hiatal hernia   ? Hypothyroidism   ? Malignant melanoma ([place]) 08/20/2019  ? Left Posterior Neck (in situ) exc  ? Pneumonia   ? Sclerosing  adenosis of left breast 03/2015  ? Vitamin D deficiency   ? ? ?Tobacco History: ?Social History  ? ?Tobacco Use  ?Smoking Status Never  ?Smokeless Tobacco Never  ? ?Counseling given: Not Answered ? ? ?Outpatient Medications Prior to Visit  ?Medication Sig Dispense Refill  ? albuterol (PROVENTIL) (2.5 MG/3ML) 0.083% nebulizer solution Take 3 mLs (2.5 mg total) by nebulization every 6 (six) hours as needed for wheezing or  shortness of breath. 360 mL 11  ? albuterol (VENTOLIN HFA) 108 (90 Base) MCG/ACT inhaler INHALE 2 PUFFS BY MOUTH EVERY 6 HOURS AS NEEDED FOR WHEEZING OR SHORTNESS OF BREATH . APPOINTMENT REQUIRED FOR FUTURE REFILLS 27 g 1  ? budesonide-formoterol (SYMBICORT) 160-4.5 MCG/ACT inhaler Symbicort 160 mcg-4.5 mcg/actuation HFA aerosol inhaler ? INHALE 2 PUFFS BY MOUTH TWICE DAILY    ? calcium carbonate (TUMS - DOSED IN MG ELEMENTAL CALCIUM) 500 MG chewable tablet Chew 500 mg by mouth daily as needed for indigestion or heartburn.    ? cetirizine (ZYRTEC) 10 MG tablet Take 10 mg by mouth daily.    ? Cholecalciferol 25 MCG (1000 UT) capsule Take 10,000 Units by mouth 3 (three) times a week.    ? cromolyn (NASALCROM) 5.2 MG/ACT nasal spray Place 2 sprays into both nostrils daily as needed for allergies.    ? Cyanocobalamin (B-12) 5000 MCG CAPS Take 5,000 mcg by mouth every Monday, Wednesday, and Friday.    ? levothyroxine (SYNTHROID) 112 MCG tablet Synthroid 112 mcg tablet ? TAKE 1 TABLET BY MOUTH ONCE DAILY IN THE MORNING ON AN EMPTY STOMACH    ? nitroGLYCERIN (NITROSTAT) 0.4 MG SL tablet Place 0.4 mg under the tongue every 5 (five) minutes as needed for chest pain.    ? rosuvastatin (CRESTOR) 5 MG tablet 1 tablet    ? tretinoin (RETIN-A) 0.1 % cream Apply topically at bedtime.    ? PERFOROMIST 20 MCG/2ML nebulizer solution USE 1 VIAL  IN  NEBULIZER TWICE  DAILY - Morning And Evening (Patient not taking: Reported on 06/02/2021) 2 mL 11  ? predniSONE (DELTASONE) 10 MG tablet Take 2 tablets daily until feeling better then take 1 tablet daily (Patient not taking: Reported on 06/02/2021) 60 tablet 0  ? predniSONE (DELTASONE) 10 MG tablet TAKE 4 TABLETS BY MOUTH DAILY FOR 2 DAYS THEN 2 TABLETS DAILY FOR 2 DAYS THEN 1 TABLET DAILY FOR 2 DAYS (Patient not taking: Reported on 06/02/2021) 14 tablet 0  ? ?No facility-administered medications prior to visit.  ? ? ? ?Review of Systems:  ? ?Constitutional:   No  weight loss, night sweats,   Fevers, chills,  ?+fatigue, or  lassitude. ? ?HEENT:   No headaches,  Difficulty swallowing,  Tooth/dental problems, or  Sore throat,  ?              No sneezing, itching, ear ache, +nasal congestion, post nasal drip,  ? ?CV:  No chest pain,  Orthopnea, PND, swelling in lower extremities, anasarca, dizziness, palpitations, syncope.  ? ?GI  No heartburn, indigestion, abdominal pain, nausea, vomiting, diarrhea, change in bowel habits, loss of appetite, bloody stools.  ? ?Resp:  No chest wall deformity ? ?Skin: no rash or lesions. ? ?GU: no dysuria, change in color of urine, no urgency or frequency.  No flank pain, no hematuria  ? ?MS:  No joint pain or swelling.  No decreased range of motion.  No back pain. ? ? ? ?Physical Exam ? ?BP 120/60 (BP Location: Left Arm, Patient Position: Sitting,  Cuff Size: Normal)   Pulse (!) 108   Temp 98.1 ?F (36.7 ?C) (Oral)   Ht 5' (1.524 m)   Wt 143 lb (64.9 kg)   SpO2 95%   BMI 27.93 kg/m?  ? ?GEN: A/Ox3; pleasant , NAD, well nourished  ?  ?HEENT:  [place]/AT,   NOSE-clear, THROAT-clear, no lesions, no postnasal drip or exudate noted.  ? ?NECK:  Supple w/ fair ROM; no JVD; normal carotid impulses w/o bruits; no thyromegaly or nodules palpated; no lymphadenopathy.   ? ?RESP  Clear  P & A; w/o, wheezes/ rales/ or rhonchi. no accessory muscle use, no dullness to percussion ? ?CARD:  RRR, no m/r/g, no peripheral edema, pulses intact, no cyanosis or clubbing.no calf pain, neg homans sign .  ? ?GI:   Soft & nt; nml bowel sounds; no organomegaly or masses detected.  ? ?Musco: Warm bil, no deformities or joint swelling noted.  ? ?Neuro: alert, no focal deficits noted.   ? ?Skin: Warm, no lesions or rashes ? ? ? ?Lab Results: ? ? ? ?BMET ?ProBNP ? ? ?Imaging: ?No results found. ? ? ? ? ? ?Lab Results  ?Component Value Date  ? NITRICOXIDE 35 08/07/2017  ? ? ? ? ? ? ?Assessment & Plan:  ? ?Asthma, chronic, moderate persistent, uncomplicated ?Recent flare slow to resolve with ongoing sx burden  (doe)  ?FENO remains elevated.  ?Will add Singulair . Unable to do LAMA due to urinary retention. Prev intolerant to BREZTRI.  ?Hold on additional steroids  ?Check CXR and labs.  ?Will need PFT going forward.  ?Could

## 2021-06-02 NOTE — Assessment & Plan Note (Signed)
Recent flare slow to resolve with ongoing sx burden (doe)  ?FENO remains elevated.  ?Will add Singulair . Unable to do LAMA due to urinary retention. Prev intolerant to BREZTRI.  ?Hold on additional steroids  ?Check CXR and labs.  ?Will need PFT going forward.  ?Could consider Biologic ? Tespire if ongoing sx despite regimen .  ? ?Plan  ?Patient Instructions  ?Chest xray today.  ?Labs today ?Continue on Symbicort 2 puffs Twice daily, rinse after use.  ?Add Singulair 10 mg daily ?Continue on Zyrtec 10 mg daily ?Continue on Cromolyn nasal spray daily ?Albuterol inhaler or nebulizer as needed  ?Follow up in 2 weeks and As needed   ?Please contact office for sooner follow up if symptoms do not improve or worsen or seek emergency care  ? ?  ? ?

## 2021-06-02 NOTE — Assessment & Plan Note (Signed)
DOE -progressive. No desats on room air in office  ?Check chest xray , labs with bnp/d dimer (recent covid ) , cbc  ?Maximize asthma treatment .  ?Advised to follow up with Cardiology as she has CAD . Denies current chest pain .  ? ?Plan  ?Patient Instructions  ?Chest xray today.  ?Labs today ?Continue on Symbicort 2 puffs Twice daily, rinse after use.  ?Add Singulair 10 mg daily ?Continue on Zyrtec 10 mg daily ?Continue on Cromolyn nasal spray daily ?Albuterol inhaler or nebulizer as needed  ?Follow up in 2 weeks and As needed   ?Please contact office for sooner follow up if symptoms do not improve or worsen or seek emergency care  ? ?  ? ?

## 2021-06-03 ENCOUNTER — Emergency Department (HOSPITAL_COMMUNITY): Payer: Medicare Other

## 2021-06-03 DIAGNOSIS — R7989 Other specified abnormal findings of blood chemistry: Secondary | ICD-10-CM | POA: Diagnosis not present

## 2021-06-03 DIAGNOSIS — U071 COVID-19: Secondary | ICD-10-CM | POA: Diagnosis not present

## 2021-06-03 MED ORDER — PREDNISONE 20 MG PO TABS: ORAL_TABLET | ORAL | 0 refills | Status: DC

## 2021-06-03 MED ORDER — PREDNISONE 20 MG PO TABS
60.0000 mg | ORAL_TABLET | Freq: Once | ORAL | Status: AC
  Administered 2021-06-03: 60 mg via ORAL
  Filled 2021-06-03: qty 3

## 2021-06-03 MED ORDER — IOHEXOL 350 MG/ML SOLN
75.0000 mL | Freq: Once | INTRAVENOUS | Status: AC | PRN
  Administered 2021-06-03: 75 mL via INTRAVENOUS

## 2021-06-03 NOTE — ED Notes (Signed)
CT staff notified pre meds for contrast Ax given and completed on the pt. ?

## 2021-06-03 NOTE — ED Provider Notes (Signed)
?[facility] ?Provider Note ? ? ?CSN: [id number] ?Arrival date & time: 06/02/21  1826 ? ?  ? ?History ? ?Chief Complaint  ?Patient presents with  ? Shortness of Breath  ? ? ?Sonia [name] is a 81 y.o. female. ? ?Patient is a history of asthma and seasonal allergies has had COVID a couple times recently the presents to the emergency department today secondary to to an elevated D-dimer.  Patient states that she feels like she is just having problems with her asthma.  She was going to see her pulmonologist but could not Sosol his nurse practitioner.  A D-dimer and BNP and basic labs were ordered.  The D-dimer came back at 0.60 so she was sent here for CT scan as they could not order VQ scan as an outpatient.  Patient states she has no chest pain.  Does not have any lower extremity swelling. ? ? ?Shortness of Breath ? ?  ? ?Home Medications ?Prior to Admission medications   ?Medication Sig Start Date End Date Taking? Authorizing Provider  ?albuterol (PROVENTIL) (2.5 MG/3ML) 0.083% nebulizer solution Take 3 mLs (2.5 mg total) by nebulization every 6 (six) hours as needed for wheezing or shortness of breath. 05/29/21   Tanda Rockers, MD  ?albuterol (VENTOLIN HFA) 108 (90 Base) MCG/ACT inhaler INHALE 2 PUFFS BY MOUTH EVERY 6 HOURS AS NEEDED FOR WHEEZING OR SHORTNESS OF BREATH . APPOINTMENT REQUIRED FOR FUTURE REFILLS 05/29/21   Tanda Rockers, MD  ?budesonide-formoterol [facility]) 160-4.5 MCG/ACT inhaler Symbicort 160 mcg-4.5 mcg/actuation HFA aerosol inhaler ? INHALE 2 PUFFS BY MOUTH TWICE DAILY    [provider]  ?calcium carbonate (TUMS - DOSED IN MG ELEMENTAL CALCIUM) 500 MG chewable tablet Chew 500 mg by mouth daily as needed for indigestion or heartburn.    [provider]  ?cetirizine (ZYRTEC) 10 MG tablet Take 10 mg by mouth daily.    [provider]  ?Cholecalciferol 25 MCG (1000 UT) capsule Take 10,000 Units by mouth 3 (three) times a  week.    [provider]  ?cromolyn (NASALCROM) 5.2 MG/ACT nasal spray Place 2 sprays into both nostrils daily as needed for allergies.    [provider]  ?Cyanocobalamin (B-12) 5000 MCG CAPS Take 5,000 mcg by mouth every Monday, Wednesday, and Friday.    [provider]  ?levothyroxine (SYNTHROID) 112 MCG tablet Synthroid 112 mcg tablet ? TAKE 1 TABLET BY MOUTH ONCE DAILY IN THE MORNING ON AN EMPTY STOMACH    [provider]  ?montelukast (SINGULAIR) 10 MG tablet Take 1 tablet (10 mg total) by mouth at bedtime. 06/02/21   Parrett, Fonnie Mu, NP  ?nitroGLYCERIN (NITROSTAT) 0.4 MG SL tablet Place 0.4 mg under the tongue every 5 (five) minutes as needed for chest pain.    [provider]  ?predniSONE (DELTASONE) 20 MG tablet 3 tabs po day one, then 2 tabs daily x 4 days 06/03/21   Susette Seminara, Corene Cornea, MD  ?rosuvastatin (CRESTOR) 5 MG tablet 1 tablet    [provider]  ?tretinoin (RETIN-A) 0.1 % cream Apply topically at bedtime. 02/01/20   [provider]  ?   ? ?Allergies    ?Adenosine, Contrast media [iodinated contrast media], Codeine sulfate, and Latex   ? ?Review of Systems   ?Review of Systems  ?Respiratory:  Positive for shortness of breath.   ? ?Physical Exam ?Updated Vital Signs ?BP 107/77   Pulse 76   Temp 98.5 ?F (36.9 ?C) (Oral)  Resp 18   SpO2 96%  ?Physical Exam ?Vitals and nursing note reviewed.  ?Constitutional:   ?   Appearance: She is well-developed.  ?HENT:  ?   Head: Normocephalic and atraumatic.  ?Cardiovascular:  ?   Rate and Rhythm: Normal rate and regular rhythm.  ?Pulmonary:  ?   Effort: Pulmonary effort is normal. Tachypnea present. No respiratory distress.  ?   Breath sounds: No stridor. Decreased breath sounds present. No wheezing.  ?Chest:  ?   Chest wall: No mass or tenderness.  ?Abdominal:  ?   General: There is no distension.  ?   Palpations: Abdomen is soft.  ?Musculoskeletal:     ?   General: Normal range of motion.  ?    Cervical back: Normal range of motion.  ?   Right lower leg: No edema.  ?   Left lower leg: No edema.  ?Skin: ?   General: Skin is warm and dry.  ?Neurological:  ?   General: No focal deficit present.  ?   Mental Status: She is alert.  ? ? ?ED Results / Procedures / Treatments   ?Labs ?(all labs ordered are listed, but only abnormal results are displayed) ?Labs Reviewed  ?RESP PANEL BY RT-PCR (FLU A&B, COVID) ARPGX2 - Abnormal; Notable for the following components:  ?    Result Value  ? SARS Coronavirus 2 by RT PCR POSITIVE (*)   ? All other components within normal limits  ?BASIC METABOLIC PANEL - Abnormal; Notable for the following components:  ? Glucose, Bld 106 (*)   ? BUN 30 (*)   ? Creatinine, Ser 1.08 (*)   ? Calcium 8.8 (*)   ? GFR, Estimated 51 (*)   ? All other components within normal limits  ?CBC WITH DIFFERENTIAL/PLATELET  ?BRAIN NATRIURETIC PEPTIDE  ?TROPONIN I (HIGH SENSITIVITY)  ?TROPONIN I (HIGH SENSITIVITY)  ? ? ?EKG ?EKG Interpretation ? ?Date/Time:  Friday June 02 2021 18:37:29 EDT ?Ventricular Rate:  106 ?PR Interval:  126 ?QRS Duration: 64 ?QT Interval:  322 ?QTC Calculation: 427 ?R Axis:   83 ?Text Interpretation: Sinus tachycardia with occasional Premature ventricular complexes Otherwise normal ECG When compared with ECG of 22-Sep-2017 08:10, PREVIOUS ECG IS PRESENT Confirmed by Merrily Pew [phone]) on 06/03/2021 2:16:22 AM ? ?Radiology ?CT Angio Chest PE W/Cm &/Or Wo Cm ? ?Result Date: 06/03/2021 ?CLINICAL DATA:  Elevated D-dimer.  Concern for pulmonary embolism. EXAM: CT ANGIOGRAPHY CHEST WITH CONTRAST TECHNIQUE: Multidetector CT imaging of the chest was performed using the standard protocol during bolus administration of intravenous contrast. Multiplanar CT image reconstructions and MIPs were obtained to evaluate the vascular anatomy. RADIATION DOSE REDUCTION: This exam was performed according to the departmental dose-optimization program which includes automated exposure control,  adjustment of the mA and/or kV according to patient size and/or use of iterative reconstruction technique. CONTRAST:  65m OMNIPAQUE IOHEXOL 350 MG/ML SOLN COMPARISON:  Chest radiograph dated 06/02/2021. FINDINGS: Cardiovascular: There is no cardiomegaly. Trace pericardial effusion. There is coronary vascular calcification and calcification of the mitral annulus. Moderate atherosclerotic calcification of the thoracic aorta. No aneurysmal dilatation or dissection. No pulmonary artery embolus identified. Mediastinum/Nodes: No hilar or mediastinal adenopathy. There is a large hiatal hernia. The esophagus is grossly unremarkable. No mediastinal fluid collection. Lungs/Pleura: No focal consolidation, pleural effusion, or pneumothorax. The central airways are patent. Upper Abdomen: Colonic diverticulosis.  Bilateral renal cysts. Musculoskeletal: No acute osseous pathology. Review of the MIP images confirms the above findings. IMPRESSION: 1. No acute intrathoracic  pathology. No CT evidence of pulmonary artery embolus. 2. Large hiatal hernia. 3. Colonic diverticulosis. 4. Aortic Atherosclerosis (ICD10-I70.0). Electronically Signed   By: Anner Crete M.D.   On: 06/03/2021 01:49   ? ?Procedures ?Procedures  ? ? ?Medications Ordered in ED ?Medications  ?hydrocortisone sodium succinate (SOLU-CORTEF) 100 MG injection 200 mg (200 mg Intravenous Given 06/02/21 1922)  ?diphenhydrAMINE (BENADRYL) capsule 50 mg ( Oral See Alternative 06/03/21 0002)  ?  Or  ?diphenhydrAMINE (BENADRYL) injection 50 mg (50 mg Intravenous Given 06/03/21 0002)  ?iohexol (OMNIPAQUE) 350 MG/ML injection 75 mL (75 mLs Intravenous Contrast Given 06/03/21 0135)  ?predniSONE (DELTASONE) tablet 60 mg (60 mg Oral Given 06/03/21 0227)  ? ? ?ED Course/ Medical Decision Making/ A&P ?  ?                        ?Medical Decision Making ?Risk ?Prescription drug management. ? ?CT scan negative.  Suspect likely asthma exacerbation.  We will do a burst of steroids.  He has  breathing treatments at home.  COVID is positive but she also was COVID-positive a few weeks ago so this may just be residual from that. Either way is not newly hypoxic nor in respiratory distress requiring further wo

## 2021-06-05 NOTE — Progress Notes (Signed)
Called and spoke with patient, advised her of the results/recommendations per Rexene Edison NP.  She verbalized understanding.  She states she went to the ER and she was given a short burst of prednisone.

## 2021-06-06 ENCOUNTER — Ambulatory Visit (HOSPITAL_BASED_OUTPATIENT_CLINIC_OR_DEPARTMENT_OTHER): Payer: Medicare Other | Admitting: Physical Therapy

## 2021-06-08 ENCOUNTER — Encounter (HOSPITAL_BASED_OUTPATIENT_CLINIC_OR_DEPARTMENT_OTHER): Payer: Medicare Other | Admitting: Physical Therapy

## 2021-06-13 ENCOUNTER — Ambulatory Visit (HOSPITAL_BASED_OUTPATIENT_CLINIC_OR_DEPARTMENT_OTHER): Payer: Medicare Other | Attending: Family Medicine | Admitting: Physical Therapy

## 2021-06-13 ENCOUNTER — Encounter (HOSPITAL_BASED_OUTPATIENT_CLINIC_OR_DEPARTMENT_OTHER): Payer: Self-pay | Admitting: Physical Therapy

## 2021-06-13 DIAGNOSIS — R293 Abnormal posture: Secondary | ICD-10-CM | POA: Diagnosis not present

## 2021-06-13 DIAGNOSIS — R262 Difficulty in walking, not elsewhere classified: Secondary | ICD-10-CM | POA: Insufficient documentation

## 2021-06-13 NOTE — Therapy (Signed)
?OUTPATIENT PHYSICAL THERAPY TREATMENT ? ?Progress Note ?Reporting Period 04/25/21 to 06/13/21 ? ? ?See note below for Objective Data and Assessment of Progress/Goals.  ? ?  ? ? ?Patient Name: Sonia [name] ?MRN: [id number] ?DOB:Apr 24, 1940, 81 y.o., female ?Today's Date: 06/13/2021 ? ? PT End of Session - 06/13/21 1348   ? ? Visit Number 10   ? Number of Visits 25   ? Date for PT Re-Evaluation 07/24/21   ? Authorization Type MCR   ? PT Start Time 1345   ? PT Stop Time 1430   ? PT Time Calculation (min) 45 min   ? Activity Tolerance Patient tolerated treatment well   ? Behavior During Therapy [facility] for tasks assessed/performed   ? ?  ?  ? ?  ? ? ? ? ? ? ? ? ? ? ?Past Medical History:  ?Diagnosis Date  ? Arthritis   ? Asthma   ? daily and prn inhalers; states good control currently  ? CAD S/P percutaneous coronary angioplasty 07/15/2016  ? COPD (chronic obstructive pulmonary disease) ([place])   ? GERD (gastroesophageal reflux disease)   ? no current med.  ? GI bleed 07/25/2016  ? Brilinta changed to Plavix  ? Hashimoto's thyroiditis   ? Headache   ? allergies; silent migraines  ? Hiatal hernia   ? Hypothyroidism   ? Malignant melanoma ([place]) 08/20/2019  ? Left Posterior Neck (in situ) exc  ? Pneumonia   ? 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 Vitamin D deficiency   ? ?Past Surgical History:  ?Procedure Laterality Date  ? ABDOMINAL HYSTERECTOMY    ? partial  ? APPENDECTOMY    ? BREAST BIOPSY Left   ? BREAST CYST EXCISION Left   ? BREAST EXCISIONAL BIOPSY Left 03/2015  ? BREAST LUMPECTOMY Right 08/2018  ? BREAST LUMPECTOMY WITH RADIOACTIVE SEED LOCALIZATION Left 04/07/2015  ? Procedure: BREAST LUMPECTOMY WITH RADIOACTIVE SEED LOCALIZATION;  Surgeon: Erroll Luna, MD;  Location: [facility];  Service: General;  Laterality: Left;  ? BREAST LUMPECTOMY WITH RADIOACTIVE SEED LOCALIZATION Right 08/13/2018  ? Procedure: RIGHT BREAST LUMPECTOMY WITH RADIOACTIVE SEED LOCALIZATION;  Surgeon: Rolm Bookbinder, MD;  Location: [facility];  Service: General;  Laterality: Right;  ? CORONARY STENT INTERVENTION N/A 07/15/2016  ? Procedure: Coronary Stent Intervention;  Surgeon: Sherren Mocha, MD;  Location: [facility];  Service: Cardiovascular;  Laterality: N/A;  ? ESOPHAGOGASTRODUODENOSCOPY (EGD) WITH PROPOFOL Left 07/25/2016  ? Procedure: ESOPHAGOGASTRODUODENOSCOPY (EGD) WITH PROPOFOL;  Surgeon: Ronnette Juniper, MD;  Location: [facility];  Service: Gastroenterology;  Laterality: Left;  ? KNEE ARTHROSCOPY Right 12/07/2003  ? LAPAROSCOPIC SALPINGO OOPHERECTOMY Right 09/08/2003  ? LEFT HEART CATH N/A 07/15/2016  ? Procedure: Left Heart Cath;  Surgeon: Sherren Mocha, MD;  Location: [facility];  Service: Cardiovascular;  Laterality: N/A;  ? LEFT HEART CATH AND CORONARY ANGIOGRAPHY N/A 04/14/2019  ? Procedure: LEFT HEART CATH AND CORONARY ANGIOGRAPHY;  Surgeon: Burnell Blanks, MD;  Location: [facility];  Service: Cardiovascular;  Laterality: N/A;  ? LYSIS OF ADHESION  09/08/2003  ? NASAL SINUS SURGERY  age 73  ? RECTAL POLYPECTOMY  10/02/2006  ? ?Patient Active Problem List  ? Diagnosis Date Noted  ? Coronary artery disease of native artery of native heart with stable angina pectoris ([place])   ? Asthma, chronic, moderate persistent, uncomplicated 29/92/4268  ? DOE (dyspnea on exertion) 07/31/2018  ? Osteopenia 06/04/2018  ? Carcinoma of upper-outer quadrant of  right breast in female, estrogen receptor positive ([place]) 05/30/2018  ? GI bleed 07/25/2016  ? Melena 07/24/2016  ? Acute renal insufficiency 07/24/2016  ? CAD S/P percutaneous coronary angioplasty 07/16/2016  ? NSTEMI (non-ST elevated myocardial infarction) ([place]) 07/13/2016  ? Severe asthma with acute exacerbation 03/07/2016  ? Genetic testing 06/09/2015  ? Family history of breast cancer in female 05/10/2015  ? History of melanoma 05/10/2015  ? Atypical lobular hyperplasia of left breast 04/26/2015  ? Vision problem 01/08/2014  ?  Hiatus hernia syndrome 08/01/2012  ? Palpitations 10/18/2010  ? Acute chest pain 10/18/2010  ? Hypothyroidism 10/18/2010  ? Dyslipidemia 06/07/2009  ? Upper airway cough syndrome 03/29/2009  ? COPD with asthma ([place]) 12/05/2006  ? GERD 12/05/2006  ? ? ?PCP: Glenis Smoker, MD ? ?REFERRING PROVIDER: Glenis Smoker, MD  ? ?REFERRING DIAG: R26.89 (ICD-10-CM) - Other abnormalities of gait and mobility  ? ?THERAPY DIAG:  ?Difficulty in walking, not elsewhere classified ? ?Abnormal posture ? ?ONSET DATE: about 2 years ago- when COVID shut down began ? ?SUBJECTIVE:  ? ?SUBJECTIVE STATEMENT: ?Pt states she is "whispy" day with her breathing. She has been dealing with allergy season. She does feel her endurance and posture have improved.   ? ?PERTINENT HISTORY: ?asthma ?PAIN:  ?Are you having pain? No ? ? ?LIVING ENVIRONMENT: ?Lives with: lives with their family, dog ?Lives in: House/apartment ?Stairs: No;  ? ? ?OCCUPATION: retired Marine scientist ? ?PLOF: Independent ? ?PATIENT GOALS: Improve endurance and would like to be able to walk more, be able to do more daily functions/chores without being tired ? ? ?OBJECTIVE:  ?FOTO  ? ?42 at eval ?50 at D/C ?6 pts MCII ? ?6th Visit FOTO 3/6: 42pts ?10th visit FOTO 4/4: 49 pts ? ? ?FUNCTIONAL TESTS:  ?Previous: ?5 times sit to stand: 13s ?6 minute walk test: 1031f  no breaks needed (97% O2 saturation)  ? ? ?4/4 Progress Note ? ?LE MMT: 4+/5 throughout bilat hips and knees ? ?5 times sit to stand: 9s without UE, no pain  ? ?6 minute walk test: 12220fno breaks needed (97% O2 saturation)  ? ? ?TODAY'S TREATMENT: ? ?Recumbent bike L1 3 min  ?STS 5x5 5lb KB ?Blue TB rowing 4x10 ? ?Circuit: 4x with 1 min rest ?LAQ 10x ?Standing march 20x ?Bridging 15x ? ? ?Previous: ?UBE L1 1 min retro and fwd; 2m66moff; 4x ? ?2mi94mest breaks: ?Farmer's carry 10lbs in each hand 1 hallway length 4x laps ?Sit to Stand YTB at knees 5lb KB 3x10 ?Standing HR/TR 20x ? ?Circuit: 30s rest 3x rounds ?Blue  TB rowing 3x10 ?Standing march 30x ? ?PATIENT EDUCATION:  ?Education details:  exam findings, deficits, goal planning, exercise progression, aquatic therapy,  expectations, muscle firing,  envelope of function, HEP, POC ?Person educated: Patient ?Education method: Explanation, Demonstration, Tactile cues, Verbal cues, and Handouts ?Education comprehension: verbalized understanding, returned demonstration, verbal cues required, tactile cues required, and needs further education ? ? ?HOME EXERCISE PROGRAM: ?[id number] ? ? ?ASSESSMENT: ? ?CLINICAL IMPRESSION: ?Pt with objective improvement in terms of walking endurance as well as LE strength. Pt with improvements in walking distance but with expected SOB given asthma and allergy season. Pt with good tolerance to continuation of exercise at today's session and revisiting of exercise circuits/intervals. Plan to transition to aquatic therapy environment, per pt request, in pool due to land based endurance deficits and reported difficulties with warmer weather/allergy season. ? ?Objective impairments include decreased activity tolerance, decreased endurance, difficulty  walking, decreased strength, increased muscle spasms, impaired flexibility, and postural dysfunction. These impairments are limiting patient from cleaning, community activity, and lives independently- has difficulty with endurance to complete ADLs . Personal factors including Fitness, Time since onset of injury/illness/exacerbation, and 3+ comorbidities: h/o breast CA, pneumonia, asthma  are also affecting patient's functional outcome. Patient will benefit from skilled PT to address above impairments and improve overall function. We stood and talked in the aquatics facility to ensure that pt would tolerate temp and humidity and she verbalized readiness for aquatic treatments.  ? ?REHAB POTENTIAL: Good ? ?CLINICAL DECISION MAKING: Unstable/unpredictable ? ?EVALUATION COMPLEXITY: High ? ? ?GOALS: ? ? ?SHORT TERM  GOALS: ? ?STG Name Target Date Goal status  ?1 Pt will become independent with HEP in order to demonstrate synthesis of PT education. ? 07/25/2021 ? MET  ? ?LONG TERM GOALS:  ? ?LTG Name Target Date Goal st

## 2021-06-14 ENCOUNTER — Encounter: Payer: Self-pay | Admitting: Adult Health

## 2021-06-14 ENCOUNTER — Ambulatory Visit (INDEPENDENT_AMBULATORY_CARE_PROVIDER_SITE_OTHER): Payer: Medicare Other | Admitting: Adult Health

## 2021-06-14 DIAGNOSIS — K219 Gastro-esophageal reflux disease without esophagitis: Secondary | ICD-10-CM | POA: Diagnosis not present

## 2021-06-14 DIAGNOSIS — J4551 Severe persistent asthma with (acute) exacerbation: Secondary | ICD-10-CM | POA: Diagnosis not present

## 2021-06-14 NOTE — Assessment & Plan Note (Signed)
Add pepcid At bedtime   ?

## 2021-06-14 NOTE — Patient Instructions (Addendum)
Continue on Symbicort 2 puffs Twice daily, rinse after use.  ?Albuterol inhaler As needed   ?Start Singulair 10 mg daily ?Continue on Zyrtec 10 mg daily ?Continue on Cromolyn nasal spray daily ?Albuterol inhaler or nebulizer as needed  ?Add Pepcid '20mg'$  At bedtime .  ?Follow up in 3 months with Dr. Melvyn Novas and as needed ?Please contact office for sooner follow up if symptoms do not improve or worsen or seek emergency care  ? ?

## 2021-06-14 NOTE — Progress Notes (Signed)
? ?'@Patient'$  ID: Sonia [name], female    DOB: 04-12-1940, 81 y.o.   MRN: [id number] ? ?Chief Complaint  ?Patient presents with  ? Follow-up  ? ? ?Referring provider: ?Glenis Smoker, * ? ?HPI: ?81 year old female never smoker followed for chronic obstructive asthma ?Medical history significant for GERD and large hiatal hernia ?COVID-19 infection late January 2023 ?History of coronary disease status post stent, breast cancer right breast- lumpectomy no xrt .  ? ?TEST/EVENTS :  ?Allergy panel Jul 31, 2018 negative except for mildly positive dust, IgE 11, absolute eosinophil count 100 ?Feno 07/2017 35  ?  ?Left heart cath April 14, 2019 patent stent RCA, moderate mid LAD stenosis unchanged, normal LV systolic function. ?  ?2D echo 2018 EF at 60 to 25%, grade 2 diastolic dysfunction, pulmonary artery pressure 37 mmHg. ?  ?Pulmonary function testing 07/30/2011 reviewed by me showed moderate to severe obstruction without a bronchodilator response, normal lung volumes and normal diffusion capacity.  (FEV1 61%, ratio 47, FVC 92%, no significant bronchodilator response, DLCO 94%.  Mid flows very severe obstruction at 18%. ? ?06/14/2021 Follow  up : Asthma  ?Patient presents for a 2-week follow-up.  Patient was seen last visit for an ongoing asthma exacerbation.  She had been treated with a steroid taper with ongoing symptoms.  Patient had RSV in late 2022 and then COVID-19 infection in January 2023.  She was treated in the outpatient setting.  She had been set up for physical therapy due to physical deconditioning.  Last visit patient's exhaled nitric oxide testing was elevated at 57.  Walk test in the office showed no desaturations last visit.  Chest x-ray showed showed clear lungs.  Work did show a elevated D-dimer.  Subsequently she went to the emergency room. Started on Prednisone taper .   CT chest was negative for PE.  Showed no acute process.  Was positive for large hiatal hernia.  She was continued on  her Symbicort.  She was started on Singulair 10 mg daily.  I continued on Zyrtec and nasal sprays.  Since last visit patient says she is feeling better.  Her shortness of breath is decreased and cough and wheezing are improved.  ACT score is 15. ?Has tried BREZTRI but caused urinary retention.  ? ? ? ? ?Allergies  ?Allergen Reactions  ? Adenosine Anaphylaxis  ? Contrast Media [Iodinated Contrast Media] Anaphylaxis  ? Codeine Sulfate Nausea And Vomiting  ? Latex Other (See Comments)  ?  Skin irritation  ? ? ?Immunization History  ?Administered Date(s) Administered  ? Influenza Split 12/11/2010, 12/11/2011, 01/20/2013, 12/23/2014, 01/28/2017, 12/23/2017  ? Influenza Whole 01/10/2009  ? Influenza, High Dose Seasonal PF 01/29/2017, 12/23/2017  ? Influenza,inj,Quad PF,6+ Mos 12/11/2015  ? Influenza,inj,quad, With Preservative 02/09/2017  ? Influenza-Unspecified 12/11/2018  ? Pneumococcal Conjugate PCV 7 03/12/2016  ? Pneumococcal Conjugate-13 10/09/2013, 03/12/2014, 03/12/2016  ? Pneumococcal Polysaccharide-23 12/14/2002, 03/12/2006  ? Pneumococcal-Unspecified 03/12/2016  ? Td 03/12/2006  ? ? ?Past Medical History:  ?Diagnosis Date  ? Arthritis   ? Asthma   ? daily and prn inhalers; states good control currently  ? CAD S/P percutaneous coronary angioplasty 07/15/2016  ? COPD (chronic obstructive pulmonary disease) ([place])   ? GERD (gastroesophageal reflux disease)   ? no current med.  ? GI bleed 07/25/2016  ? Brilinta changed to Plavix  ? Hashimoto's thyroiditis   ? Headache   ? allergies; silent migraines  ? Hiatal hernia   ? Hypothyroidism   ? Malignant melanoma (  [place]) 08/20/2019  ? Left Posterior Neck (in situ) exc  ? Pneumonia   ? 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 Vitamin D deficiency   ? ? ?Tobacco History: ?Social History  ? ?Tobacco Use  ?Smoking Status Never  ?Smokeless Tobacco Never  ? ?Counseling given: Not Answered ? ? ?Outpatient Medications Prior to Visit  ?Medication Sig Dispense Refill  ? albuterol  (PROVENTIL) (2.5 MG/3ML) 0.083% nebulizer solution Take 3 mLs (2.5 mg total) by nebulization every 6 (six) hours as needed for wheezing or shortness of breath. 360 mL 11  ? albuterol (VENTOLIN HFA) 108 (90 Base) MCG/ACT inhaler INHALE 2 PUFFS BY MOUTH EVERY 6 HOURS AS NEEDED FOR WHEEZING OR SHORTNESS OF BREATH . APPOINTMENT REQUIRED FOR FUTURE REFILLS 27 g 1  ? budesonide-formoterol (SYMBICORT) 160-4.5 MCG/ACT inhaler Symbicort 160 mcg-4.5 mcg/actuation HFA aerosol inhaler ? INHALE 2 PUFFS BY MOUTH TWICE DAILY    ? calcium carbonate (TUMS - DOSED IN MG ELEMENTAL CALCIUM) 500 MG chewable tablet Chew 500 mg by mouth daily as needed for indigestion or heartburn.    ? cetirizine (ZYRTEC) 10 MG tablet Take 10 mg by mouth daily.    ? Cholecalciferol 25 MCG (1000 UT) capsule Take 10,000 Units by mouth 3 (three) times a week.    ? cromolyn (NASALCROM) 5.2 MG/ACT nasal spray Place 2 sprays into both nostrils daily as needed for allergies.    ? Cyanocobalamin (B-12) 5000 MCG CAPS Take 5,000 mcg by mouth every Monday, Wednesday, and Friday.    ? levothyroxine (SYNTHROID) 112 MCG tablet Synthroid 112 mcg tablet ? TAKE 1 TABLET BY MOUTH ONCE DAILY IN THE MORNING ON AN EMPTY STOMACH    ? montelukast (SINGULAIR) 10 MG tablet Take 1 tablet (10 mg total) by mouth at bedtime. 30 tablet 11  ? nitroGLYCERIN (NITROSTAT) 0.4 MG SL tablet Place 0.4 mg under the tongue every 5 (five) minutes as needed for chest pain.    ? rosuvastatin (CRESTOR) 5 MG tablet 1 tablet    ? tretinoin (RETIN-A) 0.1 % cream Apply topically at bedtime.    ? predniSONE (DELTASONE) 20 MG tablet 3 tabs po day one, then 2 tabs daily x 4 days (Patient not taking: Reported on 06/14/2021) 11 tablet 0  ? ?No facility-administered medications prior to visit.  ? ? ? ?Review of Systems:  ? ?Constitutional:   No  weight loss, night sweats,  Fevers, chills, fatigue, or  lassitude. ? ?HEENT:   No headaches,  Difficulty swallowing,  Tooth/dental problems, or  Sore throat,  ?               No sneezing, itching, ear ache, nasal congestion, post nasal drip,  ? ?CV:  No chest pain,  Orthopnea, PND, swelling in lower extremities, anasarca, dizziness, palpitations, syncope.  ? ?GI  No heartburn, indigestion, abdominal pain, nausea, vomiting, diarrhea, change in bowel habits, loss of appetite, bloody stools.  ? ?Resp:   No chest wall deformity ? ?Skin: no rash or lesions. ? ?GU: no dysuria, change in color of urine, no urgency or frequency.  No flank pain, no hematuria  ? ?MS:  No joint pain or swelling.  No decreased range of motion.  No back pain. ? ? ? ?Physical Exam ? ?BP 104/64 (BP Location: Left Arm, Cuff Size: Normal)   Pulse 94   Temp 97.7 ?F (36.5 ?C) (Temporal)   Ht '5\' 4"'$  (1.626 m)   Wt 144 lb (65.3 kg)   SpO2 97%  BMI 24.72 kg/m?  ? ?GEN: A/Ox3; pleasant , NAD,  ?  ?HEENT:  [place]/AT,   NOSE-clear, THROAT-clear, no lesions, no postnasal drip or exudate noted.  ? ?NECK:  Supple w/ fair ROM; no JVD; normal carotid impulses w/o bruits; no thyromegaly or nodules palpated; no lymphadenopathy.   ? ?RESP  Clear  P & A; w/o, wheezes/ rales/ or rhonchi. no accessory muscle use, no dullness to percussion ? ?CARD:  RRR, no m/r/g, no peripheral edema, pulses intact, no cyanosis or clubbing. ? ?GI:   Soft & nt; nml bowel sounds; no organomegaly or masses detected.  ? ?Musco: Warm bil, no deformities or joint swelling noted.  ? ?Neuro: alert, no focal deficits noted.   ? ?Skin: Warm, no lesions or rashes ? ? ? ?Lab Results: ? ?CBC ?   ?Component Value Date/Time  ? WBC 8.9 06/02/2021 1942  ? RBC 4.63 06/02/2021 1942  ? HGB 14.1 06/02/2021 1942  ? HGB 14.3 04/10/2019 1044  ? HCT 43.3 06/02/2021 1942  ? HCT 43.5 04/10/2019 1044  ? PLT 163 06/02/2021 1942  ? PLT 168 04/10/2019 1044  ? MCV 93.5 06/02/2021 1942  ? MCV 95 04/10/2019 1044  ? MCH 30.5 06/02/2021 1942  ? MCHC 32.6 06/02/2021 1942  ? RDW 14.7 06/02/2021 1942  ? RDW 13.0 04/10/2019 1044  ? LYMPHSABS 2.9 06/02/2021 1942  ? MONOABS 0.6 06/02/2021  1942  ? EOSABS 0.3 06/02/2021 1942  ? BASOSABS 0.1 06/02/2021 1942  ? ? ?BMET ?   ?Component Value Date/Time  ? NA 141 06/02/2021 1942  ? NA 144 04/10/2019 1044  ? K 4.3 06/02/2021 1942  ? CL 109 03/24/2

## 2021-06-14 NOTE — Assessment & Plan Note (Signed)
Recent exacerbation- slow to resolve,appears improved today  ?ER w.up neg for PE improved after steroids  ?Begin Singulair . , control for GERD triggers ? ?Asthma action plan discussed  ? ?Plan  ?  ?Patient Instructions  ?Continue on Symbicort 2 puffs Twice daily, rinse after use.  ?Albuterol inhaler As needed   ?Start Singulair 10 mg daily ?Continue on Zyrtec 10 mg daily ?Continue on Cromolyn nasal spray daily ?Albuterol inhaler or nebulizer as needed  ?Add Pepcid '20mg'$  At bedtime .  ?Follow up in 3 months with Dr. Melvyn Novas and as needed ?Please contact office for sooner follow up if symptoms do not improve or worsen or seek emergency care  ? ?  ? ?

## 2021-06-15 ENCOUNTER — Ambulatory Visit (HOSPITAL_BASED_OUTPATIENT_CLINIC_OR_DEPARTMENT_OTHER): Payer: Medicare Other | Admitting: Physical Therapy

## 2021-06-15 NOTE — Therapy (Deleted)
?OUTPATIENT PHYSICAL THERAPY TREATMENT ? ?Progress Note ?Reporting Period 04/25/21 to 06/15/21 ? ? ?See note below for Objective Data and Assessment of Progress/Goals.  ? ?  ? ? ?Patient Name: Sonia [name] ?MRN: [id number] ?DOB:1940/09/02, 81 y.o., female ?Today's Date: 06/15/2021 ? ? ? ? ? ? ? ? ? ? ? ? ?Past Medical History:  ?Diagnosis Date  ? Arthritis   ? Asthma   ? daily and prn inhalers; states good control currently  ? CAD S/P percutaneous coronary angioplasty 07/15/2016  ? COPD (chronic obstructive pulmonary disease) ([place])   ? GERD (gastroesophageal reflux disease)   ? no current med.  ? GI bleed 07/25/2016  ? Brilinta changed to Plavix  ? Hashimoto's thyroiditis   ? Headache   ? allergies; silent migraines  ? Hiatal hernia   ? Hypothyroidism   ? Malignant melanoma ([place]) 08/20/2019  ? Left Posterior Neck (in situ) exc  ? Pneumonia   ? 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 Vitamin D deficiency   ? ?Past Surgical History:  ?Procedure Laterality Date  ? ABDOMINAL HYSTERECTOMY    ? partial  ? APPENDECTOMY    ? BREAST BIOPSY Left   ? BREAST CYST EXCISION Left   ? BREAST EXCISIONAL BIOPSY Left 03/2015  ? BREAST LUMPECTOMY Right 08/2018  ? BREAST LUMPECTOMY WITH RADIOACTIVE SEED LOCALIZATION Left 04/07/2015  ? Procedure: BREAST LUMPECTOMY WITH RADIOACTIVE SEED LOCALIZATION;  Surgeon: Erroll Luna, MD;  Location: [facility];  Service: General;  Laterality: Left;  ? BREAST LUMPECTOMY WITH RADIOACTIVE SEED LOCALIZATION Right 08/13/2018  ? Procedure: RIGHT BREAST LUMPECTOMY WITH RADIOACTIVE SEED LOCALIZATION;  Surgeon: Rolm Bookbinder, MD;  Location: [facility];  Service: General;  Laterality: Right;  ? CORONARY STENT INTERVENTION N/A 07/15/2016  ? Procedure: Coronary Stent Intervention;  Surgeon: Sherren Mocha, MD;  Location: [facility];  Service: Cardiovascular;  Laterality: N/A;  ? ESOPHAGOGASTRODUODENOSCOPY (EGD) WITH PROPOFOL Left 07/25/2016  ? Procedure:  ESOPHAGOGASTRODUODENOSCOPY (EGD) WITH PROPOFOL;  Surgeon: Ronnette Juniper, MD;  Location: [facility];  Service: Gastroenterology;  Laterality: Left;  ? KNEE ARTHROSCOPY Right 12/07/2003  ? LAPAROSCOPIC SALPINGO OOPHERECTOMY Right 09/08/2003  ? LEFT HEART CATH N/A 07/15/2016  ? Procedure: Left Heart Cath;  Surgeon: Sherren Mocha, MD;  Location: [facility];  Service: Cardiovascular;  Laterality: N/A;  ? LEFT HEART CATH AND CORONARY ANGIOGRAPHY N/A 04/14/2019  ? Procedure: LEFT HEART CATH AND CORONARY ANGIOGRAPHY;  Surgeon: Burnell Blanks, MD;  Location: [facility];  Service: Cardiovascular;  Laterality: N/A;  ? LYSIS OF ADHESION  09/08/2003  ? NASAL SINUS SURGERY  age 74  ? RECTAL POLYPECTOMY  10/02/2006  ? ?Patient Active Problem List  ? Diagnosis Date Noted  ? Coronary artery disease of native artery of native heart with stable angina pectoris ([place])   ? Asthma, chronic, moderate persistent, uncomplicated 93/90/[id number]  ? DOE (dyspnea on exertion) 07/31/2018  ? Osteopenia 06/04/2018  ? Carcinoma of upper-outer quadrant of right breast in female, estrogen receptor positive ([place]) 05/30/2018  ? GI bleed 07/25/2016  ? Melena 07/24/2016  ? Acute renal insufficiency 07/24/2016  ? CAD S/P percutaneous coronary angioplasty 07/16/2016  ? NSTEMI (non-ST elevated myocardial infarction) ([place]) 07/13/2016  ? Severe asthma with acute exacerbation 03/07/2016  ? Genetic testing 06/09/2015  ? Family history of breast cancer in female 05/10/2015  ? History of melanoma 05/10/2015  ? Atypical lobular hyperplasia of left breast 04/26/2015  ? Vision problem 01/08/2014  ? [place]  hernia syndrome 08/01/2012  ? Palpitations 10/18/2010  ? Acute chest pain 10/18/2010  ? Hypothyroidism 10/18/2010  ? Dyslipidemia 06/07/2009  ? Upper airway cough syndrome 03/29/2009  ? COPD with asthma ([place]) 12/05/2006  ? GERD 12/05/2006  ? ? ?PCP: Glenis Smoker, MD ? ?REFERRING PROVIDER: Glenis Smoker, MD  ? ?REFERRING DIAG: R26.89  (ICD-10-CM) - Other abnormalities of gait and mobility  ? ?THERAPY DIAG:  ?No diagnosis found. ? ?ONSET DATE: about 2 years ago- when COVID shut down began ? ?SUBJECTIVE:  ? ?SUBJECTIVE STATEMENT: ?Pt states she is "whispy" day with her breathing. She has been dealing with allergy season. She does feel her endurance and posture have improved.   ? ?PERTINENT HISTORY: ?asthma ?PAIN:  ?Are you having pain? No ? ? ?LIVING ENVIRONMENT: ?Lives with: lives with their family, dog ?Lives in: House/apartment ?Stairs: No;  ? ? ?OCCUPATION: retired Marine scientist ? ?PLOF: Independent ? ?PATIENT GOALS: Improve endurance and would like to be able to walk more, be able to do more daily functions/chores without being tired ? ? ?OBJECTIVE:  ?FOTO  ? ?42 at eval ?52 at D/C ?6 pts MCII ? ?6th Visit FOTO 3/6: 42pts ?10th visit FOTO 4/4: 49 pts ? ? ?FUNCTIONAL TESTS:  ?Previous: ?5 times sit to stand: 13s ?6 minute walk test: 1055ft  no breaks needed (97% O2 saturation)  ? ? ?4/4 Progress Note ? ?LE MMT: 4+/5 throughout bilat hips and knees ? ?5 times sit to stand: 9s without UE, no pain  ? ?6 minute walk test: 1257ft no breaks needed (97% O2 saturation)  ? ? ?TODAY'S TREATMENT: ? ?Recumbent bike L1 3 min  ?STS 5x5 5lb KB ?Blue TB rowing 4x10 ? ?Circuit: 4x with 1 min rest ?LAQ 10x ?Standing march 20x ?Bridging 15x ? ? ?Previous: ?UBE L1 1 min retro and fwd; 57min off; 4x ? ?3min rest breaks: ?Farmer's carry 10lbs in each hand 1 hallway length 4x laps ?Sit to Stand YTB at knees 5lb KB 3x10 ?Standing HR/TR 20x ? ?Circuit: 30s rest 3x rounds ?Blue TB rowing 3x10 ?Standing march 30x ? ?PATIENT EDUCATION:  ?Education details:  exam findings, deficits, goal planning, exercise progression, aquatic therapy,  expectations, muscle firing,  envelope of function, HEP, POC ?Person educated: Patient ?Education method: Explanation, Demonstration, Tactile cues, Verbal cues, and Handouts ?Education comprehension: verbalized understanding, returned demonstration,  verbal cues required, tactile cues required, and needs further education ? ? ?HOME EXERCISE PROGRAM: ?[id number] ? ? ?ASSESSMENT: ? ?CLINICAL IMPRESSION: ?Pt with objective improvement in terms of walking endurance as well as LE strength. Pt with improvements in walking distance but with expected SOB given asthma and allergy season. Pt with good tolerance to continuation of exercise at today's session and revisiting of exercise circuits/intervals. Plan to transition to aquatic therapy environment, per pt request, in pool due to land based endurance deficits and reported difficulties with warmer weather/allergy season. ? ?Objective impairments include decreased activity tolerance, decreased endurance, difficulty walking, decreased strength, increased muscle spasms, impaired flexibility, and postural dysfunction. These impairments are limiting patient from cleaning, community activity, and lives independently- has difficulty with endurance to complete ADLs . Personal factors including Fitness, Time since onset of injury/illness/exacerbation, and 3+ comorbidities: h/o breast CA, pneumonia, asthma  are also affecting patient's functional outcome. Patient will benefit from skilled PT to address above impairments and improve overall function. We stood and talked in the aquatics facility to ensure that pt would tolerate temp and humidity and she verbalized readiness for aquatic treatments.  ? ?  REHAB POTENTIAL: Good ? ?CLINICAL DECISION MAKING: Unstable/unpredictable ? ?EVALUATION COMPLEXITY: High ? ? ?GOALS: ? ? ?SHORT TERM GOALS: ? ?STG Name Target Date Goal status  ?1 Pt will become independent with HEP in order to demonstrate synthesis of PT education. ? 07/27/2021 ? MET  ? ?LONG TERM GOALS:  ? ?LTG Name Target Date Goal status  ?1 Pt  will become independent with final HEP in order to demonstrate synthesis of PT education. ? ? 09/07/2021 ? ongoing  ?2 Pt will be able to perform 5XSTS in under 12s  in order to demonstrate  functional improvement above the cut off score for adults. ? 07/18/2021 MET  ?3 Pt will be able to walk at least 3280 ft or 1020m during 6MWT in order to demonstrate improvement in functional endurance and mo

## 2021-06-19 ENCOUNTER — Other Ambulatory Visit: Payer: Self-pay | Admitting: Internal Medicine

## 2021-06-30 ENCOUNTER — Ambulatory Visit (HOSPITAL_BASED_OUTPATIENT_CLINIC_OR_DEPARTMENT_OTHER): Payer: Medicare Other | Admitting: Physical Therapy

## 2021-06-30 ENCOUNTER — Encounter (HOSPITAL_BASED_OUTPATIENT_CLINIC_OR_DEPARTMENT_OTHER): Payer: Self-pay | Admitting: Physical Therapy

## 2021-06-30 DIAGNOSIS — R293 Abnormal posture: Secondary | ICD-10-CM

## 2021-06-30 DIAGNOSIS — R262 Difficulty in walking, not elsewhere classified: Secondary | ICD-10-CM | POA: Diagnosis not present

## 2021-06-30 NOTE — Therapy (Signed)
?OUTPATIENT PHYSICAL THERAPY TREATMENT ? ?Progress Note ?Reporting Period 04/25/21 to 06/30/21 ? ? ?See note below for Objective Data and Assessment of Progress/Goals.  ? ?  ? ? ?Patient Name: Sonia [name] ?MRN: [id number] ?DOB:[birth year], 81 y.o., female ?Today's Date: 06/30/2021 ? ? PT End of Session - 06/30/21 1433   ? ? Visit Number 11   ? Number of Visits 25   ? Date for PT Re-Evaluation 07/24/21   ? Authorization Type MCR   ? Progress Note Due on Visit 20   ? PT Start Time 4562   ? PT Stop Time 1510   ? PT Time Calculation (min) 39 min   ? Activity Tolerance Patient tolerated treatment well   ? Behavior During Therapy [facility] for tasks assessed/performed   ? ?  ?  ? ?  ? ? ? ? ? ? ? ? ? ? ?Past Medical History:  ?Diagnosis Date  ? Arthritis   ? Asthma   ? daily and prn inhalers; states good control currently  ? CAD S/P percutaneous coronary angioplasty 07/15/2016  ? COPD (chronic obstructive pulmonary disease) ([place])   ? GERD (gastroesophageal reflux disease)   ? no current med.  ? GI bleed 07/25/2016  ? Brilinta changed to Plavix  ? Hashimoto's thyroiditis   ? Headache   ? allergies; silent migraines  ? Hiatal hernia   ? Hypothyroidism   ? Malignant melanoma ([place]) 08/20/2019  ? Left Posterior Neck (in situ) exc  ? Pneumonia   ? 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 Vitamin D deficiency   ? ?Past Surgical History:  ?Procedure Laterality Date  ? ABDOMINAL HYSTERECTOMY    ? partial  ? APPENDECTOMY    ? BREAST BIOPSY Left   ? BREAST CYST EXCISION Left   ? BREAST EXCISIONAL BIOPSY Left 03/2015  ? BREAST LUMPECTOMY Right 08/2018  ? BREAST LUMPECTOMY WITH RADIOACTIVE SEED LOCALIZATION Left 04/07/2015  ? Procedure: BREAST LUMPECTOMY WITH RADIOACTIVE SEED LOCALIZATION;  Surgeon: Erroll Luna, MD;  Location: [place];  Service: General;  Laterality: Left;  ? BREAST LUMPECTOMY WITH RADIOACTIVE SEED LOCALIZATION Right 08/13/2018  ? Procedure: RIGHT BREAST LUMPECTOMY WITH RADIOACTIVE SEED  LOCALIZATION;  Surgeon: Rolm Bookbinder, MD;  Location: [place];  Service: General;  Laterality: Right;  ? CORONARY STENT INTERVENTION N/A 07/15/2016  ? Procedure: Coronary Stent Intervention;  Surgeon: Sherren Mocha, MD;  Location: [place] [facility];  Service: Cardiovascular;  Laterality: N/A;  ? ESOPHAGOGASTRODUODENOSCOPY (EGD) WITH PROPOFOL Left 07/25/2016  ? Procedure: ESOPHAGOGASTRODUODENOSCOPY (EGD) WITH PROPOFOL;  Surgeon: Ronnette Juniper, MD;  Location: [place];  Service: Gastroenterology;  Laterality: Left;  ? KNEE ARTHROSCOPY Right 12/07/2003  ? LAPAROSCOPIC SALPINGO OOPHERECTOMY Right 09/08/2003  ? LEFT HEART CATH N/A 07/15/2016  ? Procedure: Left Heart Cath;  Surgeon: Sherren Mocha, MD;  Location: [place] [facility];  Service: Cardiovascular;  Laterality: N/A;  ? LEFT HEART CATH AND CORONARY ANGIOGRAPHY N/A 04/14/2019  ? Procedure: LEFT HEART CATH AND CORONARY ANGIOGRAPHY;  Surgeon: Burnell Blanks, MD;  Location: [name] [facility];  Service: Cardiovascular;  Laterality: N/A;  ? LYSIS OF ADHESION  09/08/2003  ? NASAL SINUS SURGERY  age 5  ? RECTAL POLYPECTOMY  10/02/2006  ? ?Patient Active Problem List  ? Diagnosis Date Noted  ? Coronary artery disease of native artery of native heart with stable angina pectoris ([place])   ? Asthma, chronic, moderate persistent, uncomplicated 56/38/9373  ? DOE (dyspnea on exertion) 07/31/2018  ?  Osteopenia 06/04/2018  ? Carcinoma of upper-outer quadrant of right breast in female, estrogen receptor positive ([place]) 05/30/2018  ? GI bleed 07/25/2016  ? Melena 07/24/2016  ? Acute renal insufficiency 07/24/2016  ? CAD S/P percutaneous coronary angioplasty 07/16/2016  ? NSTEMI (non-ST elevated myocardial infarction) ([place]) 07/13/2016  ? Severe asthma with acute exacerbation 03/07/2016  ? Genetic testing 06/09/2015  ? Family history of breast cancer in female 05/10/2015  ? History of melanoma 05/10/2015  ? Atypical lobular hyperplasia of left breast  04/26/2015  ? Vision problem 01/08/2014  ? Hiatus hernia syndrome 08/01/2012  ? Palpitations 10/18/2010  ? Acute chest pain 10/18/2010  ? Hypothyroidism 10/18/2010  ? Dyslipidemia 06/07/2009  ? Upper airway cough syndrome 03/29/2009  ? COPD with asthma ([place]) 12/05/2006  ? GERD 12/05/2006  ? ? ?PCP: Glenis Smoker, MD ? ?REFERRING PROVIDER: Glenis Smoker, MD  ? ?REFERRING DIAG: R26.89 (ICD-10-CM) - Other abnormalities of gait and mobility  ? ?THERAPY DIAG:  ?Difficulty in walking, not elsewhere classified ? ?Abnormal posture ? ?ONSET DATE: about 2 years ago- when COVID shut down began ? ?SUBJECTIVE:  ? ?SUBJECTIVE STATEMENT: ?Pt states her allergies continue to bother her. No other new changes.  ? ?PERTINENT HISTORY: ?asthma ?PAIN:  ?Are you having pain? No ? ? ?LIVING ENVIRONMENT: ?Lives with: lives with their family, dog ?Lives in: House/apartment ?Stairs: No;  ? ? ?OCCUPATION: retired Marine scientist ? ?PLOF: Independent ? ?PATIENT GOALS: Improve endurance and would like to be able to walk more, be able to do more daily functions/chores without being tired ? ? ?OBJECTIVE:  ?FOTO  ? ?42 at eval ?52 at D/C ?6 pts MCII ? ?6th Visit FOTO 3/6: 42pts ?10th visit FOTO 4/4: 49 pts ? ? ?FUNCTIONAL TESTS:  ?Previous: ?5 times sit to stand: 13s ?6 minute walk test: 1041f  no breaks needed (97% O2 saturation)  ? ? ?4/4 Progress Note ? ?LE MMT: 4+/5 throughout bilat hips and knees ? ?5 times sit to stand: 9s without UE, no pain  ? ?6 minute walk test: 12265fno breaks needed (97% O2 saturation)  ? ? ?TODAY'S TREATMENT: ? ?Pt seen for aquatic therapy today.  Treatment took place in water 3.25-4 ft in [facility] Temp of water was 91?.  Pt entered/exited the pool via stairs independently with bilat rail. ? ?Forward and backward gait without UE support.  ?Side stepping with arms at side;  side stepping with arm abdct/ addct ?Shoulder shrug to scap depression x 10 ?Unilateral shoulder flex to  neutral x 5 each; core engaged rainbow hand buoys ?Rainbow hand buoys under water and high knee marching  ?Gentle forward walking  ?Horiz abdct/ add with hand buoys on surface of water x 10  ?Squats pushing rainbow hand buoy under water 12; repeated holding wall x 12 ?Hip abdct / add x 10, 2 sets; cues for toes forward ?Gentle backward walking hands at side ?Sitting on yellow noodle between legs - bicycle x 4 min ? ? ? ?Pt requires buoyancy for support and to offload joints with strengthening exercises. Viscosity of the water is needed for resistance of strengthening; water current perturbations provides challenge to standing balance unsupported, requiring increased core activation. ? ?Previous:  ?Recumbent bike L1 3 min  ?STS 5x5 5lb KB ?Blue TB rowing 4x10 ? ?Circuit: 4x with 1 min rest ?LAQ 10x ?Standing march 20x ?Bridging 15x ? ?PATIENT EDUCATION:  ?Education details:  exam findings, deficits, goal planning, exercise progression, aquatic therapy,  expectations, muscle firing,  envelope of function, HEP, POC ?Person educated: Patient ?Education method: Explanation, Demonstration, Tactile cues, Verbal cues, and Handouts ?Education comprehension: verbalized understanding, returned demonstration, verbal cues required, tactile cues required, and needs further education ? ? ?HOME EXERCISE PROGRAM: ?[id number] ? ? ?ASSESSMENT: ? ?CLINICAL IMPRESSION: ?Pt is confident and independent in aquatic setting; doesn't require UE support and can take direction from therapist on deck. Cues given for lifted chest and more neutral scapulas throughout session. Some cues to slow the pace of the exercise so she could have improved posture.   Good tolerance for exercises in the water today.  Progressing well towards LTGs.  ? ?Objective impairments include decreased activity tolerance, decreased endurance, difficulty walking, decreased strength, increased muscle spasms, impaired flexibility, and postural dysfunction. These impairments  are limiting patient from cleaning, community activity, and lives independently- has difficulty with endurance to complete ADLs . Personal factors including Fitness, Time since onset of injury/illness/exacerbation, and

## 2021-07-04 ENCOUNTER — Ambulatory Visit (HOSPITAL_BASED_OUTPATIENT_CLINIC_OR_DEPARTMENT_OTHER): Payer: Medicare Other | Admitting: Physical Therapy

## 2021-07-04 ENCOUNTER — Encounter (HOSPITAL_BASED_OUTPATIENT_CLINIC_OR_DEPARTMENT_OTHER): Payer: Self-pay | Admitting: Physical Therapy

## 2021-07-04 DIAGNOSIS — R293 Abnormal posture: Secondary | ICD-10-CM

## 2021-07-04 DIAGNOSIS — R262 Difficulty in walking, not elsewhere classified: Secondary | ICD-10-CM

## 2021-07-04 NOTE — Therapy (Signed)
?OUTPATIENT PHYSICAL THERAPY TREATMENT ? ? ? ?  ? ? ?Patient Name: Sonia [name] ?MRN: [id number] ?DOB:06/26/1940, 81 y.o., female ?Today's Date: 07/04/2021 ? ? PT End of Session - 07/04/21 1405   ? ? Visit Number 12   ? Number of Visits 25   ? Date for PT Re-Evaluation 07/24/21   ? Authorization Type MCR   ? Progress Note Due on Visit 20   ? PT Start Time 1358   ? PT Stop Time 1440   ? PT Time Calculation (min) 42 min   ? Activity Tolerance Patient tolerated treatment well   ? Behavior During Therapy [facility] for tasks assessed/performed   ? ?  ?  ? ?  ? ? ? ? ? ? ? ? ? ? ?Past Medical History:  ?Diagnosis Date  ? Arthritis   ? Asthma   ? daily and prn inhalers; states good control currently  ? CAD S/P percutaneous coronary angioplasty 07/15/2016  ? COPD (chronic obstructive pulmonary disease) ([place])   ? GERD (gastroesophageal reflux disease)   ? no current med.  ? GI bleed 07/25/2016  ? Brilinta changed to Plavix  ? Hashimoto's thyroiditis   ? Headache   ? allergies; silent migraines  ? Hiatal hernia   ? Hypothyroidism   ? Malignant melanoma ([place]) 08/20/2019  ? Left Posterior Neck (in situ) exc  ? Pneumonia   ? 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 Vitamin D deficiency   ? ?Past Surgical History:  ?Procedure Laterality Date  ? ABDOMINAL HYSTERECTOMY    ? partial  ? APPENDECTOMY    ? BREAST BIOPSY Left   ? BREAST CYST EXCISION Left   ? BREAST EXCISIONAL BIOPSY Left 03/2015  ? BREAST LUMPECTOMY Right 08/2018  ? BREAST LUMPECTOMY WITH RADIOACTIVE SEED LOCALIZATION Left 04/07/2015  ? Procedure: BREAST LUMPECTOMY WITH RADIOACTIVE SEED LOCALIZATION;  Surgeon: Erroll Luna, MD;  Location: [place];  Service: General;  Laterality: Left;  ? BREAST LUMPECTOMY WITH RADIOACTIVE SEED LOCALIZATION Right 08/13/2018  ? Procedure: RIGHT BREAST LUMPECTOMY WITH RADIOACTIVE SEED LOCALIZATION;  Surgeon: Rolm Bookbinder, MD;  Location: [place];  Service: General;  Laterality: Right;  ?  CORONARY STENT INTERVENTION N/A 07/15/2016  ? Procedure: Coronary Stent Intervention;  Surgeon: Sherren Mocha, MD;  Location: [place] [facility];  Service: Cardiovascular;  Laterality: N/A;  ? ESOPHAGOGASTRODUODENOSCOPY (EGD) WITH PROPOFOL Left 07/25/2016  ? Procedure: ESOPHAGOGASTRODUODENOSCOPY (EGD) WITH PROPOFOL;  Surgeon: Ronnette Juniper, MD;  Location: [place];  Service: Gastroenterology;  Laterality: Left;  ? KNEE ARTHROSCOPY Right 12/07/2003  ? LAPAROSCOPIC SALPINGO OOPHERECTOMY Right 09/08/2003  ? LEFT HEART CATH N/A 07/15/2016  ? Procedure: Left Heart Cath;  Surgeon: Sherren Mocha, MD;  Location: [place] [facility];  Service: Cardiovascular;  Laterality: N/A;  ? LEFT HEART CATH AND CORONARY ANGIOGRAPHY N/A 04/14/2019  ? Procedure: LEFT HEART CATH AND CORONARY ANGIOGRAPHY;  Surgeon: Burnell Blanks, MD;  Location: [place] [facility];  Service: Cardiovascular;  Laterality: N/A;  ? LYSIS OF ADHESION  09/08/2003  ? NASAL SINUS SURGERY  age 89  ? RECTAL POLYPECTOMY  10/02/2006  ? ?Patient Active Problem List  ? Diagnosis Date Noted  ? Coronary artery disease of native artery of native heart with stable angina pectoris ([place])   ? Asthma, chronic, moderate persistent, uncomplicated 41/74/0814  ? DOE (dyspnea on exertion) 07/31/2018  ? Osteopenia 06/04/2018  ? Carcinoma of upper-outer quadrant of right breast in female, estrogen receptor positive ([place]) 05/30/2018  ?  GI bleed 07/25/2016  ? Melena 07/24/2016  ? Acute renal insufficiency 07/24/2016  ? CAD S/P percutaneous coronary angioplasty 07/16/2016  ? NSTEMI (non-ST elevated myocardial infarction) ([place]) 07/13/2016  ? Severe asthma with acute exacerbation 03/07/2016  ? Genetic testing 06/09/2015  ? Family history of breast cancer in female 05/10/2015  ? History of melanoma 05/10/2015  ? Atypical lobular hyperplasia of left breast 04/26/2015  ? Vision problem 01/08/2014  ? Hiatus hernia syndrome 08/01/2012  ? Palpitations 10/18/2010  ? Acute chest pain 10/18/2010   ? Hypothyroidism 10/18/2010  ? Dyslipidemia 06/07/2009  ? Upper airway cough syndrome 03/29/2009  ? COPD with asthma ([place]) 12/05/2006  ? GERD 12/05/2006  ? ? ?PCP: Glenis Smoker, MD ? ?REFERRING PROVIDER: Glenis Smoker, MD  ? ?REFERRING DIAG: R26.89 (ICD-10-CM) - Other abnormalities of gait and mobility  ? ?THERAPY DIAG:  ?Difficulty in walking, not elsewhere classified ? ?Abnormal posture ? ?ONSET DATE: about 2 years ago- when COVID shut down began ? ?SUBJECTIVE:  ? ?SUBJECTIVE STATEMENT: ?Pt states her allergies continue to bother her; had to double up her medicine today.  She states she was sore across her low back for a few hours after last session, but it resolved with rest.  ? ?PERTINENT HISTORY: ?asthma ?PAIN:  ?Are you having pain? No ? ? ?LIVING ENVIRONMENT: ?Lives with: lives with their family, dog ?Lives in: House/apartment ?Stairs: No;  ? ? ?OCCUPATION: retired Marine scientist ? ?PLOF: Independent ? ?PATIENT GOALS: Improve endurance and would like to be able to walk more, be able to do more daily functions/chores without being tired ? ? ?OBJECTIVE:  ?FOTO  ? ?44 at eval ?52 at D/C ?6 pts MCII ? ?6th Visit FOTO 3/6: 42pts ?10th visit FOTO 4/4: 49 pts ? ? ?FUNCTIONAL TESTS:  ?Previous: ?5 times sit to stand: 13s ?6 minute walk test: 1088f  no breaks needed (97% O2 saturation)  ? ? ?4/4 Progress Note ? ?LE MMT: 4+/5 throughout bilat hips and knees ? ?5 times sit to stand: 9s without UE, no pain  ? ?6 minute walk test: 12271fno breaks needed (97% O2 saturation)  ? ? ?TODAY'S TREATMENT: ? ?Pt seen for aquatic therapy today.  Treatment took place in water 3.25-4.8 ft in depth at the [facility] [facility]. Temp of water was 92?.  Pt entered/exited the pool via stairs independently with bilat rail. ? ?Forward and backward gait without UE support, cues for more vertical posture and neutral scapulas.  ?Side stepping with arms at side;  side stepping with arm abdct/ addct ?Rainbow hand buoys under  water and forward high knee marching  ?Side step squat with shoulder abdct / add with rainbow hand buoys  ?Holding onto wall: Hip abdct / add x 12, 2 sets; cues for toes forward ?Row in staggered stance x 10, 2 sets - each foot forward - cord with beige tag ?Wall push up/push off x 15 ?Warrior 1, lifting blue arched noodle off of the water x8 each leg forward ? Horiz abdct/ add with hand buoys on surface of water x 10  ?Prancing with upright trunk for increased pace - forwards / backwards ?Forward step ups (blue step at [id number]/L x 5 without UE support; lateral step ups x 5 ea ?Rt/Lt hamstring stretch x 15 sec x 2 each (cues for straight back);  bilat calf stretch x 10 sec (heels off step) ? ?Pt requires buoyancy for support and to offload joints with strengthening exercises. Viscosity of the water is  needed for resistance of strengthening; water current perturbations provides challenge to standing balance unsupported, requiring increased core activation. ? ?Previous:  ?Recumbent bike L1 3 min  ?STS 5x5 5lb KB ?Blue TB rowing 4x10 ? ?Circuit: 4x with 1 min rest ?LAQ 10x ?Standing march 20x ?Bridging 15x ? ?PATIENT EDUCATION:  ?Education details:  exam findings, deficits, goal planning, exercise progression, aquatic therapy,  expectations, muscle firing,  envelope of function, HEP, POC ?Person educated: Patient ?Education method: Explanation, Demonstration, Tactile cues, Verbal cues, and Handouts ?Education comprehension: verbalized understanding, returned demonstration, verbal cues required, tactile cues required, and needs further education ? ? ?HOME EXERCISE PROGRAM: ?[id number] ? ? ?ASSESSMENT: ? ?CLINICAL IMPRESSION: ?Pt required minor cues for lifted chest and more neutral scapulas throughout session. Some cues to slow the pace of the exercise so she could have improved posture.  She reported some fatigue and tightness in bilat fibularis muscles after completing lateral step ups in water. She demonstrated some  decreased balance with step ups and staggered stance exercises; will benefit from further work on this in future sessions.  Progressing well towards LTGs.  ? ?Objective impairments include decreased activity to

## 2021-07-06 ENCOUNTER — Ambulatory Visit (HOSPITAL_BASED_OUTPATIENT_CLINIC_OR_DEPARTMENT_OTHER): Payer: Medicare Other | Admitting: Physical Therapy

## 2021-07-10 ENCOUNTER — Other Ambulatory Visit: Payer: Self-pay | Admitting: Internal Medicine

## 2021-07-11 ENCOUNTER — Ambulatory Visit (HOSPITAL_BASED_OUTPATIENT_CLINIC_OR_DEPARTMENT_OTHER): Payer: Medicare Other | Attending: Family Medicine | Admitting: Physical Therapy

## 2021-07-11 ENCOUNTER — Encounter (HOSPITAL_BASED_OUTPATIENT_CLINIC_OR_DEPARTMENT_OTHER): Payer: Self-pay | Admitting: Physical Therapy

## 2021-07-11 DIAGNOSIS — R293 Abnormal posture: Secondary | ICD-10-CM | POA: Insufficient documentation

## 2021-07-11 DIAGNOSIS — R262 Difficulty in walking, not elsewhere classified: Secondary | ICD-10-CM | POA: Insufficient documentation

## 2021-07-11 NOTE — Therapy (Signed)
?OUTPATIENT PHYSICAL THERAPY TREATMENT ? ? ? ?  ? ? ?Patient Name: Sonia [name] ?MRN: [id number] ?DOB:January 07, 1941, 81 y.o., female ?Today's Date: 07/11/2021 ? ? PT End of Session - 07/11/21 1500   ? ? Visit Number 13   ? Number of Visits 25   ? Date for PT Re-Evaluation 07/24/21   ? Authorization Type MCR   ? Progress Note Due on Visit 20   ? PT Start Time 1455   ? PT Stop Time 1540   ? PT Time Calculation (min) 45 min   ? Activity Tolerance Patient tolerated treatment well   ? Behavior During Therapy [facility] for tasks assessed/performed   ? ?  ?  ? ?  ? ? ? ? ? ? ? ? ? ? ?Past Medical History:  ?Diagnosis Date  ? Arthritis   ? Asthma   ? daily and prn inhalers; states good control currently  ? CAD S/P percutaneous coronary angioplasty 07/15/2016  ? COPD (chronic obstructive pulmonary disease) ([place])   ? GERD (gastroesophageal reflux disease)   ? no current med.  ? GI bleed 07/25/2016  ? Brilinta changed to Plavix  ? Hashimoto's thyroiditis   ? Headache   ? allergies; silent migraines  ? Hiatal hernia   ? Hypothyroidism   ? Malignant melanoma ([place]) 08/20/2019  ? Left Posterior Neck (in situ) exc  ? Pneumonia   ? 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 Vitamin D deficiency   ? ?Past Surgical History:  ?Procedure Laterality Date  ? ABDOMINAL HYSTERECTOMY    ? partial  ? APPENDECTOMY    ? BREAST BIOPSY Left   ? BREAST CYST EXCISION Left   ? BREAST EXCISIONAL BIOPSY Left 03/2015  ? BREAST LUMPECTOMY Right 08/2018  ? BREAST LUMPECTOMY WITH RADIOACTIVE SEED LOCALIZATION Left 04/07/2015  ? Procedure: BREAST LUMPECTOMY WITH RADIOACTIVE SEED LOCALIZATION;  Surgeon: Erroll Luna, MD;  Location: [place];  Service: General;  Laterality: Left;  ? BREAST LUMPECTOMY WITH RADIOACTIVE SEED LOCALIZATION Right 08/13/2018  ? Procedure: RIGHT BREAST LUMPECTOMY WITH RADIOACTIVE SEED LOCALIZATION;  Surgeon: Rolm Bookbinder, MD;  Location: [place];  Service: General;  Laterality: Right;  ?  CORONARY STENT INTERVENTION N/A 07/15/2016  ? Procedure: Coronary Stent Intervention;  Surgeon: Sherren Mocha, MD;  Location: [place] [facility];  Service: Cardiovascular;  Laterality: N/A;  ? ESOPHAGOGASTRODUODENOSCOPY (EGD) WITH PROPOFOL Left 07/25/2016  ? Procedure: ESOPHAGOGASTRODUODENOSCOPY (EGD) WITH PROPOFOL;  Surgeon: Ronnette Juniper, MD;  Location: [place];  Service: Gastroenterology;  Laterality: Left;  ? KNEE ARTHROSCOPY Right 12/07/2003  ? LAPAROSCOPIC SALPINGO OOPHERECTOMY Right 09/08/2003  ? LEFT HEART CATH N/A 07/15/2016  ? Procedure: Left Heart Cath;  Surgeon: Sherren Mocha, MD;  Location: [place] [facility];  Service: Cardiovascular;  Laterality: N/A;  ? LEFT HEART CATH AND CORONARY ANGIOGRAPHY N/A 04/14/2019  ? Procedure: LEFT HEART CATH AND CORONARY ANGIOGRAPHY;  Surgeon: Burnell Blanks, MD;  Location: [place] [facility];  Service: Cardiovascular;  Laterality: N/A;  ? LYSIS OF ADHESION  09/08/2003  ? NASAL SINUS SURGERY  age 88  ? RECTAL POLYPECTOMY  10/02/2006  ? ?Patient Active Problem List  ? Diagnosis Date Noted  ? Coronary artery disease of native artery of native heart with stable angina pectoris ([place])   ? Asthma, chronic, moderate persistent, uncomplicated 75/12/2583  ? DOE (dyspnea on exertion) 07/31/2018  ? Osteopenia 06/04/2018  ? Carcinoma of upper-outer quadrant of right breast in female, estrogen receptor positive ([place]) 05/30/2018  ?  GI bleed 07/25/2016  ? Melena 07/24/2016  ? Acute renal insufficiency 07/24/2016  ? CAD S/P percutaneous coronary angioplasty 07/16/2016  ? NSTEMI (non-ST elevated myocardial infarction) ([place]) 07/13/2016  ? Severe asthma with acute exacerbation 03/07/2016  ? Genetic testing 06/09/2015  ? Family history of breast cancer in female 05/10/2015  ? History of melanoma 05/10/2015  ? Atypical lobular hyperplasia of left breast 04/26/2015  ? Vision problem 01/08/2014  ? Hiatus hernia syndrome 08/01/2012  ? Palpitations 10/18/2010  ? Acute chest pain 10/18/2010   ? Hypothyroidism 10/18/2010  ? Dyslipidemia 06/07/2009  ? Upper airway cough syndrome 03/29/2009  ? COPD with asthma ([place]) 12/05/2006  ? GERD 12/05/2006  ? ? ?PCP: Glenis Smoker, MD ? ?REFERRING PROVIDER: Glenis Smoker, MD  ? ?REFERRING DIAG: R26.89 (ICD-10-CM) - Other abnormalities of gait and mobility  ? ?THERAPY DIAG:  ?Difficulty in walking, not elsewhere classified ? ?Abnormal posture ? ?ONSET DATE: about 2 years ago- when COVID shut down began ? ?SUBJECTIVE:  ? ?SUBJECTIVE STATEMENT: ?Pt states her asthma has been bad since last week due to tree pollen. Hasn't left her house, debated not coming today ? ?PERTINENT HISTORY: ?asthma ?PAIN:  ?Are you having pain? No ? ? ?LIVING ENVIRONMENT: ?Lives with: lives with their family, dog ?Lives in: House/apartment ?Stairs: No;  ? ? ?OCCUPATION: retired Marine scientist ? ?PLOF: Independent ? ?PATIENT GOALS: Improve endurance and would like to be able to walk more, be able to do more daily functions/chores without being tired ? ? ?OBJECTIVE:  ?FOTO  ? ?42 at eval ?52 at D/C ?6 pts MCII ? ?6th Visit FOTO 3/6: 42pts ?10th visit FOTO 4/4: 49 pts ? ? ?FUNCTIONAL TESTS:  ?Previous: ?5 times sit to stand: 13s ?6 minute walk test: 1073f  no breaks needed (97% O2 saturation)  ? ? ?4/4 Progress Note ? ?LE MMT: 4+/5 throughout bilat hips and knees ? ?5 times sit to stand: 9s without UE, no pain  ? ?6 minute walk test: 12210fno breaks needed (97% O2 saturation)  ? ? ?TODAY'S TREATMENT: ? ?Pt seen for aquatic therapy today.  Treatment took place in water 3.25-4.8 ft in [facility]. Temp of water was 92?.  Pt entered/exited the pool via stairs independently with bilat rail. ? ?Forward and backward gait without UE support, cues for more vertical posture and neutral scapulas.  ?Side stepping with arms at side;  side stepping with arm abdct/ addct ?Rainbow hand buoys under water and forward high knee marching  ?Side step squat with shoulder abdct / add  with rainbow hand buoys  ?Holding to 1 foam hand buoys: Hip abdct / add, flex and extension x 10; cues for toes forward ?Wall push up/push off 2x12 ? Horiz abdct/ add with 1 foam hand buoys on surface of water x 10  ?-add/abd and flex/ext to neutral x10 ? ?Forward step ups bottom step R/L x 10 with UE support; ?Rt/Lt hamstring stretch x 15 sec x 2 each "Runners stretch" ? ?Pt requires buoyancy for support and to offload joints with strengthening exercises. Viscosity of the water is needed for resistance of strengthening; water current perturbations provides challenge to standing balance unsupported, requiring increased core activation. ? ?Previous:  ?Recumbent bike L1 3 min  ?STS 5x5 5lb KB ?Blue TB rowing 4x10 ? ?Circuit: 4x with 1 min rest ?LAQ 10x ?Standing march 20x ?Bridging 15x ? ?PATIENT EDUCATION:  ?Education details:  exam findings, deficits, goal planning, exercise progression, aquatic therapy,  expectations, muscle firing,  envelope of function, HEP, POC ?Person educated: Patient ?Education method: Explanation, Demonstration, Tactile cues, Verbal cues, and Handouts ?Education comprehension: verbalized understanding, returned demonstration, verbal cues required, tactile cues required, and needs further education ? ? ?HOME EXERCISE PROGRAM: ?[id number] ? ? ?ASSESSMENT: ? ?CLINICAL IMPRESSION: ?Pt reporting she has not left her home in 1 week due to her allergies being so bad flaring her asthma. She used rescue inhaler x 2 throughout treatment and is given rest periods as needed (x3) 1 seated.  She initially did not tolerate submersion beyond T10ish due to hydrostatic pressure increasing difficulty with taking deep breaths. Ends up as she tolerates session well. Able to add straddled noodle cycling. She states she breaths well in this facility due to the circulation of the air and feels much better after session as per before. Continued cuing throughout session for lifted chest and neutral scapulas to improve  postural muscles and lung space/ breathing.  ? ? ?Objective impairments include decreased activity tolerance, decreased endurance, difficulty walking, decreased strength, increased muscle spasms, impaire

## 2021-07-13 ENCOUNTER — Telehealth: Payer: Self-pay | Admitting: Adult Health

## 2021-07-13 ENCOUNTER — Ambulatory Visit (HOSPITAL_BASED_OUTPATIENT_CLINIC_OR_DEPARTMENT_OTHER): Payer: Medicare Other | Admitting: Physical Therapy

## 2021-07-13 MED ORDER — PREDNISONE 20 MG PO TABS: 20.0000 mg | ORAL_TABLET | Freq: Every day | ORAL | 0 refills | Status: DC

## 2021-07-13 NOTE — Telephone Encounter (Signed)
Patient is aware of recommendations and voiced her understanding.  °Prednisone sent to preferred pharmacy.  °Nothing further needed.  ° °

## 2021-07-13 NOTE — Telephone Encounter (Signed)
Begin prednisone 20 mg daily for 5 days, take with food.  If symptoms or not improving can see tomorrow in the office or do a virtual video visit ? ?Please contact office for sooner follow up if symptoms do not improve or worsen or seek emergency care  ? ?

## 2021-07-13 NOTE — Telephone Encounter (Signed)
Spoke to patient.  ?She stated that she has been experiencing increased SOB, postnasal drip,chest tightness, wheezing and chillsx5d. She feels that the pollen has caused her to have an asthma flare. ?She has not used Symbicort in two days, instead she plans to start Pulmicort neb solution today. (Prescribed by Dr. Melvyn Novas in 2022) ?She has used albuterol solution QID with temporary relief.  ? ?Tammy, please advise. Thanks ? ? ? ?  ?

## 2021-07-13 NOTE — Telephone Encounter (Signed)
Patient states she was told to call anytime she needed to speak with Tammy and she would call her back when she was free. ? ?Patient is having asthma troubles, starting yesterday, she had to start back on nebulizer treatment 2 times daily. Call back number is [phone].  ? ?Please advise. ? ? ?

## 2021-07-18 ENCOUNTER — Ambulatory Visit (HOSPITAL_BASED_OUTPATIENT_CLINIC_OR_DEPARTMENT_OTHER): Payer: Medicare Other | Admitting: Physical Therapy

## 2021-07-20 ENCOUNTER — Encounter (HOSPITAL_BASED_OUTPATIENT_CLINIC_OR_DEPARTMENT_OTHER): Payer: Self-pay | Admitting: Physical Therapy

## 2021-07-20 ENCOUNTER — Ambulatory Visit (HOSPITAL_BASED_OUTPATIENT_CLINIC_OR_DEPARTMENT_OTHER): Payer: Medicare Other | Admitting: Physical Therapy

## 2021-07-20 DIAGNOSIS — R262 Difficulty in walking, not elsewhere classified: Secondary | ICD-10-CM

## 2021-07-20 DIAGNOSIS — R293 Abnormal posture: Secondary | ICD-10-CM | POA: Diagnosis not present

## 2021-07-20 NOTE — Therapy (Signed)
?OUTPATIENT PHYSICAL THERAPY TREATMENT ? ? ? ?  ? ? ?Patient Name: Sonia [name] ?MRN: [id number] ?DOB:April 17, 1940, 81 y.o., female ?Today's Date: 07/20/2021 ? ? PT End of Session - 07/20/21 1401   ? ? Visit Number 14   ? Number of Visits 25   ? Date for PT Re-Evaluation 07/24/21   ? Authorization Type MCR   ? Progress Note Due on Visit 20   ? PT Start Time 1358   ? PT Stop Time 1440   ? PT Time Calculation (min) 42 min   ? Activity Tolerance Patient tolerated treatment well   ? Behavior During Therapy [facility] for tasks assessed/performed   ? ?  ?  ? ?  ? ? ? ? ? ? ? ? ? ? ?Past Medical History:  ?Diagnosis Date  ? Arthritis   ? Asthma   ? daily and prn inhalers; states good control currently  ? CAD S/P percutaneous coronary angioplasty 07/15/2016  ? COPD (chronic obstructive pulmonary disease) ([place])   ? GERD (gastroesophageal reflux disease)   ? no current med.  ? GI bleed 07/25/2016  ? Brilinta changed to Plavix  ? Hashimoto's thyroiditis   ? Headache   ? allergies; silent migraines  ? Hiatal hernia   ? Hypothyroidism   ? Malignant melanoma ([place]) 08/20/2019  ? Left Posterior Neck (in situ) exc  ? Pneumonia   ? 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 Vitamin D deficiency   ? ?Past Surgical History:  ?Procedure Laterality Date  ? ABDOMINAL HYSTERECTOMY    ? partial  ? APPENDECTOMY    ? BREAST BIOPSY Left   ? BREAST CYST EXCISION Left   ? BREAST EXCISIONAL BIOPSY Left 03/2015  ? BREAST LUMPECTOMY Right 08/2018  ? BREAST LUMPECTOMY WITH RADIOACTIVE SEED LOCALIZATION Left 04/07/2015  ? Procedure: BREAST LUMPECTOMY WITH RADIOACTIVE SEED LOCALIZATION;  Surgeon: Erroll Luna, MD;  Location: [place];  Service: General;  Laterality: Left;  ? BREAST LUMPECTOMY WITH RADIOACTIVE SEED LOCALIZATION Right 08/13/2018  ? Procedure: RIGHT BREAST LUMPECTOMY WITH RADIOACTIVE SEED LOCALIZATION;  Surgeon: Rolm Bookbinder, MD;  Location: [place];  Service: General;  Laterality: Right;  ?  CORONARY STENT INTERVENTION N/A 07/15/2016  ? Procedure: Coronary Stent Intervention;  Surgeon: Sherren Mocha, MD;  Location: [place] [facility];  Service: Cardiovascular;  Laterality: N/A;  ? ESOPHAGOGASTRODUODENOSCOPY (EGD) WITH PROPOFOL Left 07/25/2016  ? Procedure: ESOPHAGOGASTRODUODENOSCOPY (EGD) WITH PROPOFOL;  Surgeon: Ronnette Juniper, MD;  Location: [place];  Service: Gastroenterology;  Laterality: Left;  ? KNEE ARTHROSCOPY Right 12/07/2003  ? LAPAROSCOPIC SALPINGO OOPHERECTOMY Right 09/08/2003  ? LEFT HEART CATH N/A 07/15/2016  ? Procedure: Left Heart Cath;  Surgeon: Sherren Mocha, MD;  Location: [place] [facility];  Service: Cardiovascular;  Laterality: N/A;  ? LEFT HEART CATH AND CORONARY ANGIOGRAPHY N/A 04/14/2019  ? Procedure: LEFT HEART CATH AND CORONARY ANGIOGRAPHY;  Surgeon: Burnell Blanks, MD;  Location: [place] [facility];  Service: Cardiovascular;  Laterality: N/A;  ? LYSIS OF ADHESION  09/08/2003  ? NASAL SINUS SURGERY  age 81  ? RECTAL POLYPECTOMY  10/02/2006  ? ?Patient Active Problem List  ? Diagnosis Date Noted  ? Coronary artery disease of native artery of native heart with stable angina pectoris ([place])   ? Asthma, chronic, moderate persistent, uncomplicated 26/83/4196  ? DOE (dyspnea on exertion) 07/31/2018  ? Osteopenia 06/04/2018  ? Carcinoma of upper-outer quadrant of right breast in female, estrogen receptor positive ([place]) 05/30/2018  ?  GI bleed 07/25/2016  ? Melena 07/24/2016  ? Acute renal insufficiency 07/24/2016  ? CAD S/P percutaneous coronary angioplasty 07/16/2016  ? NSTEMI (non-ST elevated myocardial infarction) ([place]) 07/13/2016  ? Severe asthma with acute exacerbation 03/07/2016  ? Genetic testing 06/09/2015  ? Family history of breast cancer in female 05/10/2015  ? History of melanoma 05/10/2015  ? Atypical lobular hyperplasia of left breast 04/26/2015  ? Vision problem 01/08/2014  ? Hiatus hernia syndrome 08/01/2012  ? Palpitations 10/18/2010  ? Acute chest pain 10/18/2010   ? Hypothyroidism 10/18/2010  ? Dyslipidemia 06/07/2009  ? Upper airway cough syndrome 03/29/2009  ? COPD with asthma ([place]) 12/05/2006  ? GERD 12/05/2006  ? ? ?PCP: Glenis Smoker, MD ? ?REFERRING PROVIDER: Glenis Smoker, MD  ? ?REFERRING DIAG: R26.89 (ICD-10-CM) - Other abnormalities of gait and mobility  ? ?THERAPY DIAG:  ?Difficulty in walking, not elsewhere classified ? ?Abnormal posture ? ?ONSET DATE: about 2 years ago- when COVID shut down began ? ?SUBJECTIVE:  ? ?SUBJECTIVE STATEMENT: ?Pt states her asthma has been bad.  She just finished 7 day course of prednisone.  Due to her difficulty breathing, she hasn't completed any exercises since last visit. She reports that she had no pain after last session and attributes it to the stretches completed at end of session.  ? ?PERTINENT HISTORY: ?asthma ?PAIN:  ?Are you having pain? Yes ?Location: mid back ?Pain level: 6/10; achy ? ? ?LIVING ENVIRONMENT: ?Lives with: lives with their family, dog ?Lives in: House/apartment ?Stairs: No;  ? ? ?OCCUPATION: retired Marine scientist ? ?PLOF: Independent ? ?PATIENT GOALS: Improve endurance and would like to be able to walk more, be able to do more daily functions/chores without being tired ? ? ?OBJECTIVE:  ?FOTO  ? ?44 at eval ?52 at D/C ?6 pts MCII ? ?6th Visit FOTO 3/6: 42pts ?10th visit FOTO 4/4: 49 pts ? ? ?FUNCTIONAL TESTS:  ?Previous: ?5 times sit to stand: 13s ?6 minute walk test: 1058f  no breaks needed (97% O2 saturation)  ? ? ?4/4 Progress Note ? ?LE MMT: 4+/5 throughout bilat hips and knees ? ?5 times sit to stand: 9s without UE, no pain  ? ?6 minute walk test: 1224fno breaks needed (97% O2 saturation)  ? ? ?TODAY'S TREATMENT: ? ?Pt seen for aquatic therapy today.  Treatment took place in water 3.25-4.8 ft in depth at the [facility]. Temp of water was 92?.  Pt entered/exited the pool via stairs independently with bilat rail. ? ?Forward and backward gait without UE support, cues for more  vertical posture and neutral scapulas.  ?Side stepping with arms at side;  side stepping with arm abdct/ addct ?Wall push up/push off x20 ?High knee marching ?Seated on bench with feet on blue step:  forward lumbar stretch x 5, repeated with lateral flexion x 3 each side; kick board (vertical) push downs x 5s x 10, then push/pull; STS x 15 with cues to keep knees apart (avoid R valgus) and eccentric lowering  ?Holding wall:  hip abdct x 20; hip flex/ext (leg swings) x 10 each ?Forward / backward walking with short / quick steps  ?With yellow noodle between legs: bicycle, cc ski, scissor jacks ?Rainbow hand buoys under water and forward high knee marching  ?Rt/Lt hamstring stretch x 15 sec x 2 each  ? ?Pt requires buoyancy for support and to offload joints with strengthening exercises. Viscosity of the water is needed for resistance of strengthening; water current perturbations provides challenge  to standing balance unsupported, requiring increased core activation. ? ? ? ?PATIENT EDUCATION:  ?Education details:  exam findings, deficits, goal planning, exercise progression, aquatic therapy,  expectations, muscle firing,  envelope of function, HEP, POC ?Person educated: Patient ?Education method: Explanation, Demonstration, Tactile cues, Verbal cues, and Handouts ?Education comprehension: verbalized understanding, returned demonstration, verbal cues required, tactile cues required, and needs further education ? ? ?HOME EXERCISE PROGRAM: ?[id number] ? ? ?ASSESSMENT: ? ?CLINICAL IMPRESSION: ?Pt away from therapy due to asthma flare up. She demonstrates Rt valgus with sit to/from stand; improved alignment with cues.  She reported reduction of back pain with the seated lumbar stretch into forward flexion.  Pt introduced to the RPE/Borg scale and encouraged her to inform therapist if difficulty in breathing becomes vigorous/ hard. Overall, pt reported session mostly felt like light work. Occasional cues for lifted chest /  neutral head to improve postural muscles and lung space/ breathing. Progressing gradually towards established PT goals.  ? ? ?Objective impairments include decreased activity tolerance, decreased endurance, dif

## 2021-07-25 ENCOUNTER — Encounter (HOSPITAL_BASED_OUTPATIENT_CLINIC_OR_DEPARTMENT_OTHER): Payer: Self-pay | Admitting: Physical Therapy

## 2021-07-25 ENCOUNTER — Ambulatory Visit (HOSPITAL_BASED_OUTPATIENT_CLINIC_OR_DEPARTMENT_OTHER): Payer: Medicare Other | Admitting: Physical Therapy

## 2021-07-25 DIAGNOSIS — R262 Difficulty in walking, not elsewhere classified: Secondary | ICD-10-CM

## 2021-07-25 DIAGNOSIS — R293 Abnormal posture: Secondary | ICD-10-CM | POA: Diagnosis not present

## 2021-07-25 NOTE — Therapy (Signed)
?OUTPATIENT PHYSICAL THERAPY TREATMENT ?Recert ? ? ?  ? ? ?Patient Name: Sonia [name] ?MRN: [id number] ?DOB:September 01, 1940, 81 y.o., female ?Today's Date: 07/25/21 ? ? PT End of Session - 07/25/21 1831   ? ? Visit Number 15   ? Number of Visits 25   ? Date for PT Re-Evaluation 09/05/21   ? Authorization Type MCR   ? Progress Note Due on Visit 25  ? PT Start Time 1400   ? PT Stop Time 6294   ? PT Time Calculation (min) 45 min   ? Activity Tolerance Patient tolerated treatment well   ? Behavior During Therapy [facility] for tasks assessed/performed   ? ?  ?  ? ?  ? ? ? ? ? ? ? ? ? ? ? ? ?Past Medical History:  ?Diagnosis Date  ? Arthritis   ? Asthma   ? daily and prn inhalers; states good control currently  ? CAD S/P percutaneous coronary angioplasty 07/15/2016  ? COPD (chronic obstructive pulmonary disease) ([place])   ? GERD (gastroesophageal reflux disease)   ? no current med.  ? GI bleed 07/25/2016  ? Brilinta changed to Plavix  ? Hashimoto's thyroiditis   ? Headache   ? allergies; silent migraines  ? Hiatal hernia   ? Hypothyroidism   ? Malignant melanoma ([place]) 08/20/2019  ? Left Posterior Neck (in situ) exc  ? Pneumonia   ? 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 Vitamin D deficiency   ? ?Past Surgical History:  ?Procedure Laterality Date  ? ABDOMINAL HYSTERECTOMY    ? partial  ? APPENDECTOMY    ? BREAST BIOPSY Left   ? BREAST CYST EXCISION Left   ? BREAST EXCISIONAL BIOPSY Left 03/2015  ? BREAST LUMPECTOMY Right 08/2018  ? BREAST LUMPECTOMY WITH RADIOACTIVE SEED LOCALIZATION Left 04/07/2015  ? Procedure: BREAST LUMPECTOMY WITH RADIOACTIVE SEED LOCALIZATION;  Surgeon: Erroll Luna, MD;  Location: [place];  Service: General;  Laterality: Left;  ? BREAST LUMPECTOMY WITH RADIOACTIVE SEED LOCALIZATION Right 08/13/2018  ? Procedure: RIGHT BREAST LUMPECTOMY WITH RADIOACTIVE SEED LOCALIZATION;  Surgeon: Rolm Bookbinder, MD;  Location: [place];  Service: General;  Laterality: Right;   ? CORONARY STENT INTERVENTION N/A 07/15/2016  ? Procedure: Coronary Stent Intervention;  Surgeon: Sherren Mocha, MD;  Location: [place] [facility];  Service: Cardiovascular;  Laterality: N/A;  ? ESOPHAGOGASTRODUODENOSCOPY (EGD) WITH PROPOFOL Left 07/25/2016  ? Procedure: ESOPHAGOGASTRODUODENOSCOPY (EGD) WITH PROPOFOL;  Surgeon: Ronnette Juniper, MD;  Location: [place];  Service: Gastroenterology;  Laterality: Left;  ? KNEE ARTHROSCOPY Right 12/07/2003  ? LAPAROSCOPIC SALPINGO OOPHERECTOMY Right 09/08/2003  ? LEFT HEART CATH N/A 07/15/2016  ? Procedure: Left Heart Cath;  Surgeon: Sherren Mocha, MD;  Location: [place] [facility];  Service: Cardiovascular;  Laterality: N/A;  ? LEFT HEART CATH AND CORONARY ANGIOGRAPHY N/A 04/14/2019  ? Procedure: LEFT HEART CATH AND CORONARY ANGIOGRAPHY;  Surgeon: Burnell Blanks, MD;  Location: [place] [facility];  Service: Cardiovascular;  Laterality: N/A;  ? LYSIS OF ADHESION  09/08/2003  ? NASAL SINUS SURGERY  age 57  ? RECTAL POLYPECTOMY  10/02/2006  ? ?Patient Active Problem List  ? Diagnosis Date Noted  ? Coronary artery disease of native artery of native heart with stable angina pectoris ([place])   ? Asthma, chronic, moderate persistent, uncomplicated 76/54/6503  ? DOE (dyspnea on exertion) 07/31/2018  ? Osteopenia 06/04/2018  ? Carcinoma of upper-outer quadrant of right breast in female, estrogen receptor positive ([place]) 05/30/2018  ?  GI bleed 07/25/2016  ? Melena 07/24/2016  ? Acute renal insufficiency 07/24/2016  ? CAD S/P percutaneous coronary angioplasty 07/16/2016  ? NSTEMI (non-ST elevated myocardial infarction) ([place]) 07/13/2016  ? Severe asthma with acute exacerbation 03/07/2016  ? Genetic testing 06/09/2015  ? Family history of breast cancer in female 05/10/2015  ? History of melanoma 05/10/2015  ? Atypical lobular hyperplasia of left breast 04/26/2015  ? Vision problem 01/08/2014  ? Hiatus hernia syndrome 08/01/2012  ? Palpitations 10/18/2010  ? Acute chest pain  10/18/2010  ? Hypothyroidism 10/18/2010  ? Dyslipidemia 06/07/2009  ? Upper airway cough syndrome 03/29/2009  ? COPD with asthma ([place]) 12/05/2006  ? GERD 12/05/2006  ? ? ?PCP: Glenis Smoker, MD ? ?REFERRING PROVIDER: Glenis Smoker, MD  ? ?REFERRING DIAG: R26.89 (ICD-10-CM) - Other abnormalities of gait and mobility  ? ?THERAPY DIAG:  ?Difficulty in walking, not elsewhere classified - Plan: PT plan of care cert/re-cert ? ?Abnormal posture - Plan: PT plan of care cert/re-cert ? ?ONSET DATE: about 2 years ago- when COVID shut down began ? ?SUBJECTIVE:  ? ?SUBJECTIVE STATEMENT: ?Pt states her asthma has been bad.  She just finished 7 day course of prednisone.  Due to her difficulty breathing, she hasn't completed any exercises since last visit. She reports that she had no pain after last session and attributes it to the stretches completed at end of session.  ? ?PERTINENT HISTORY: ?asthma ?PAIN:  ?Are you having pain? Yes ?Location: mid back ?Pain level: 6/10; achy ? ? ?LIVING ENVIRONMENT: ?Lives with: lives with their family, dog ?Lives in: House/apartment ?Stairs: No;  ? ? ?OCCUPATION: retired Marine scientist ? ?PLOF: Independent ? ?PATIENT GOALS: Improve endurance and would like to be able to walk more, be able to do more daily functions/chores without being tired ? ? ?OBJECTIVE:  ?FOTO  ? ?44 at eval ?52 at D/C ?6 pts MCII ? ?6th Visit FOTO 3/6: 42pts ?10th visit FOTO 4/4: 49 pts ? ? ?FUNCTIONAL TESTS:  ?Previous: ?5 times sit to stand: 13s ?6 minute walk test: 1046f  no breaks needed (97% O2 saturation)  ? ? ?4/4 Progress Note ? ?LE MMT: 4+/5 throughout bilat hips and knees ? ?5 times sit to stand: 9s without UE, no pain  ? ?6 minute walk test: 12262fno breaks needed (97% O2 saturation)  ? ? ?TODAY'S TREATMENT: ? ?Pt seen for aquatic therapy today.  Treatment took place in water 3.25-4.8 ft in [facility]. Temp of water was 92?.  Pt entered/exited the pool via stairs independently  with bilat rail. ? ?Forward and backward gait without UE support, cues for more vertical posture and neutral scapulas.  ?Seated on bench with feet on blue step:  forward lumbar stretch 3x20s hold repeated with lateral flexion x 3 each side x20s hold; lumbar rotation R/L x10; kick board (vertical) push downs 2x 10, then push/pull 2x10; STS x 15 with cues foreccentric lowering  ?side stepping with arm abdct/ addct holding 1 foam hand buoys x 4 widths ?Wall push up/push off x10 ?UE supported by 1 foam hand buoys:High knee marching x20, add/abd x10  ?Rainbow hand buoys under water forward and backward amb x 6 widths. ?Cycling on noodle x 4 lengths, short rest after 2 for aerobic capacity training. Pulse and O2 sats monitored.  Pulse max 103. Sats 89-92 % ? ? ?Pt requires buoyancy for support and to offload joints with strengthening exercises. Viscosity of the water is needed for resistance of  strengthening; water current perturbations provides challenge to standing balance unsupported, requiring increased core activation. ? ? ? ?PATIENT EDUCATION:  ?Education details:  exam findings, deficits, goal planning, exercise progression, aquatic therapy,  expectations, muscle firing,  envelope of function, HEP, POC ?Person educated: Patient ?Education method: Explanation, Demonstration, Tactile cues, Verbal cues, and Handouts ?Education comprehension: verbalized understanding, returned demonstration, verbal cues required, tactile cues required, and needs further education ? ? ?HOME EXERCISE PROGRAM: ?[id number] ? ? ?ASSESSMENT: ? ?CLINICAL IMPRESSION: ?Continued edu on use of the modified Borg scale for pt to better monitor her exertion level.  She is instructed to rest if when exerting she can not carry on conversation to ensure proper ventilation. She is breathing with little difficulty today. Tolerates session very well. Requires 3 rest periods. Given vc for execution as well as posture. Modified Borg At 3. ?Pt has progressed  well with goals meeting 5x STS and 6 MWT goals as noted above.  She has missed many sessions due to allergy season and exacerbation of lung issues.  She will continue to benefit from skilled therapy with ad

## 2021-07-27 ENCOUNTER — Ambulatory Visit (HOSPITAL_BASED_OUTPATIENT_CLINIC_OR_DEPARTMENT_OTHER): Payer: Medicare Other | Admitting: Physical Therapy

## 2021-07-31 ENCOUNTER — Ambulatory Visit (INDEPENDENT_AMBULATORY_CARE_PROVIDER_SITE_OTHER): Payer: Medicare Other | Admitting: Dermatology

## 2021-07-31 DIAGNOSIS — L820 Inflamed seborrheic keratosis: Secondary | ICD-10-CM

## 2021-07-31 DIAGNOSIS — D034 Melanoma in situ of scalp and neck: Secondary | ICD-10-CM | POA: Diagnosis not present

## 2021-07-31 DIAGNOSIS — D044 Carcinoma in situ of skin of scalp and neck: Secondary | ICD-10-CM | POA: Diagnosis not present

## 2021-07-31 DIAGNOSIS — Z85820 Personal history of malignant melanoma of skin: Secondary | ICD-10-CM

## 2021-07-31 DIAGNOSIS — L570 Actinic keratosis: Secondary | ICD-10-CM

## 2021-07-31 DIAGNOSIS — D039 Melanoma in situ, unspecified: Secondary | ICD-10-CM

## 2021-07-31 DIAGNOSIS — Z1283 Encounter for screening for malignant neoplasm of skin: Secondary | ICD-10-CM | POA: Diagnosis not present

## 2021-07-31 DIAGNOSIS — D0461 Carcinoma in situ of skin of right upper limb, including shoulder: Secondary | ICD-10-CM | POA: Diagnosis not present

## 2021-07-31 DIAGNOSIS — D485 Neoplasm of uncertain behavior of skin: Secondary | ICD-10-CM

## 2021-07-31 DIAGNOSIS — D049 Carcinoma in situ of skin, unspecified: Secondary | ICD-10-CM

## 2021-07-31 HISTORY — DX: Melanoma in situ, unspecified: D03.9

## 2021-07-31 NOTE — Patient Instructions (Signed)

## 2021-08-02 ENCOUNTER — Encounter (HOSPITAL_BASED_OUTPATIENT_CLINIC_OR_DEPARTMENT_OTHER): Payer: Self-pay | Admitting: Physical Therapy

## 2021-08-02 ENCOUNTER — Ambulatory Visit (HOSPITAL_BASED_OUTPATIENT_CLINIC_OR_DEPARTMENT_OTHER): Payer: Medicare Other | Admitting: Physical Therapy

## 2021-08-02 DIAGNOSIS — R293 Abnormal posture: Secondary | ICD-10-CM | POA: Diagnosis not present

## 2021-08-02 DIAGNOSIS — R262 Difficulty in walking, not elsewhere classified: Secondary | ICD-10-CM | POA: Diagnosis not present

## 2021-08-02 NOTE — Therapy (Signed)
OUTPATIENT PHYSICAL THERAPY TREATMENT  PT End of Session -        Visit Number 16    Number of Visits 25     Date for PT Re-Evaluation 09/05/21     Authorization Type MCR     Progress Note Due on Visit 25     PT Start Time 1100    PT Stop Time 1140    PT Time Calculation (min) 40 min     Activity Tolerance Patient tolerated treatment well     Behavior During Therapy [facility] for tasks assessed/performed         Patient Name: Sonia [name] MRN: [id number] DOB:January 28, 1941, 81 y.o., female Today's Date: 07/25/21      Past Medical History:  Diagnosis Date   Arthritis    Asthma    daily and prn inhalers; states good control currently   CAD S/P percutaneous coronary angioplasty 07/15/2016   COPD (chronic obstructive pulmonary disease) ([facility])    GERD (gastroesophageal reflux disease)    no current med.   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   Left Posterior Neck (in situ) exc   Pneumonia    Sclerosing adenosis of left breast 03/2015   Vitamin D deficiency    Past Surgical History:  Procedure Laterality Date   ABDOMINAL HYSTERECTOMY     partial   APPENDECTOMY     BREAST BIOPSY Left    BREAST CYST EXCISION Left    BREAST EXCISIONAL BIOPSY Left 03/2015   BREAST LUMPECTOMY Right 08/2018   BREAST LUMPECTOMY WITH RADIOACTIVE SEED LOCALIZATION Left 04/07/2015   Procedure: BREAST LUMPECTOMY WITH RADIOACTIVE SEED LOCALIZATION;  Surgeon: Erroll Luna, MD;  Location: [place];  Service: General;  Laterality: Left;   BREAST LUMPECTOMY WITH RADIOACTIVE SEED LOCALIZATION Right 08/13/2018   Procedure: RIGHT BREAST LUMPECTOMY WITH RADIOACTIVE SEED LOCALIZATION;  Surgeon: Rolm Bookbinder, MD;  Location: [place];  Service: General;  Laterality: Right;   CORONARY STENT INTERVENTION N/A  07/15/2016   Procedure: Coronary Stent Intervention;  Surgeon: Sherren Mocha, MD;  Location: [place] [facility];  Service: Cardiovascular;  Laterality: N/A;   ESOPHAGOGASTRODUODENOSCOPY (EGD) WITH PROPOFOL Left 07/25/2016   Procedure: ESOPHAGOGASTRODUODENOSCOPY (EGD) WITH PROPOFOL;  Surgeon: Ronnette Juniper, MD;  Location: [place];  Service: Gastroenterology;  Laterality: Left;   KNEE ARTHROSCOPY Right 12/07/2003   LAPAROSCOPIC SALPINGO OOPHERECTOMY Right 09/08/2003   LEFT HEART CATH N/A 07/15/2016   Procedure: Left Heart Cath;  Surgeon: Sherren Mocha, MD;  Location: [place] [facility];  Service: Cardiovascular;  Laterality: N/A;   LEFT HEART CATH AND CORONARY ANGIOGRAPHY N/A 04/14/2019   Procedure: LEFT HEART CATH AND CORONARY ANGIOGRAPHY;  Surgeon: Burnell Blanks, MD;  Location: [place] [facility];  Service: Cardiovascular;  Laterality: N/A;   LYSIS OF ADHESION  09/08/2003   NASAL SINUS SURGERY  age 39   RECTAL POLYPECTOMY  10/02/2006   Patient Active Problem List   Diagnosis Date Noted   Coronary artery disease of native artery of native heart with stable angina pectoris ([facility])    Asthma, chronic, moderate persistent, uncomplicated 86/76/1950   DOE (dyspnea on exertion) 07/31/2018   Osteopenia 06/04/2018   Carcinoma of upper-outer quadrant of right breast in female, estrogen receptor positive ([place]) 05/30/2018   GI bleed 07/25/2016   Melena 07/24/2016  Acute renal insufficiency 07/24/2016   CAD S/P percutaneous coronary angioplasty 07/16/2016   NSTEMI (non-ST elevated myocardial infarction) ([place]) 07/13/2016   Severe asthma with acute exacerbation 03/07/2016   Genetic testing 06/09/2015   Family history of breast cancer in female 05/10/2015   History of melanoma 05/10/2015   Atypical lobular hyperplasia of left breast 04/26/2015   Vision problem 01/08/2014   Hiatus hernia syndrome 08/01/2012   Palpitations 10/18/2010   Acute chest pain 10/18/2010   Hypothyroidism 10/18/2010    Dyslipidemia 06/07/2009   Upper airway cough syndrome 03/29/2009   COPD with asthma ([place]) 12/05/2006   GERD 12/05/2006    PCP: Glenis Smoker, MD  REFERRING PROVIDER: Glenis Smoker, MD   REFERRING DIAG: R26.89 (ICD-10-CM) - Other abnormalities of gait and mobility   THERAPY DIAG:  Difficulty in walking, not elsewhere classified  Abnormal posture  ONSET DATE: about 2 years ago- when COVID shut down began  SUBJECTIVE:   SUBJECTIVE STATEMENT:  PERTINENT HISTORY: asthma PAIN:  Are you having pain? no Location:  Pain level: 0/10   LIVING ENVIRONMENT: Lives with: lives with their family, dog Lives in: House/apartment Stairs: No;    OCCUPATION: retired Marine scientist  PLOF: Independent  PATIENT GOALS: Improve endurance and would like to be able to walk more, be able to do more daily functions/chores without being tired   OBJECTIVE:  FOTO   44 at eval 52 at D/C 6 pts MCII  6th Visit FOTO 3/6: 42pts 10th visit FOTO 4/4: 49 pts   FUNCTIONAL TESTS:  Previous: 5 times sit to stand: 13s 6 minute walk test: 1075f  no breaks needed (97% O2 saturation)    4/4 Progress Note  LE MMT: 4+/5 throughout bilat hips and knees  5 times sit to stand: 9s without UE, no pain   6 minute walk test: 12217fno breaks needed (97% O2 saturation)    TODAY'S TREATMENT:  Pt seen for aquatic therapy today.  Treatment took place in water 3.25-4.8 ft in depth at the [facility]. Temp of water was 92.  Pt entered/exited the pool via stairs independently with bilat rail.  Hip abdct x 20each LE, 2 sets; light jogging Forward and backward gait without UE support, cues for more vertical posture and neutral scapulas.  Side stepping With red paddles:  side step with arms abdct/add;  bilat shoulder ER/IR, bilat shoulder flexion to/from neutral, bilat shoulder horiz abdct/ add   Cycling on noodle aerobic capacity training. Forward/ backward pedaling.  Pulse and O2  sats monitored.  HR 89-100. Sats 96-97% Wall push up/push off x10 each Mini squat with resisted lumbar rotation with kickboard on side Rainbow hand buoys under water forward and backward amb x 6 widths. Low back stretch with feet on wall Plank on bench with alternating hip ext x 10 Plank on yellow noodle, then noodle pull downs x 10     Pt requires buoyancy for support and to offload joints with strengthening exercises. Viscosity of the water is needed for resistance of strengthening; water current perturbations provides challenge to standing balance unsupported, requiring increased core activation.    PATIENT EDUCATION:  Education details:  exam findings, deficits, goal planning, exercise progression, aquatic therapy,  expectations, muscle firing,  envelope of function, HEP, POC Person educated: Patient Education method: Explanation, Demonstration, Tactile cues, Verbal cues, and Handouts Education comprehension: verbalized understanding, returned demonstration, verbal cues required, tactile cues required, and needs further education   HOME EXERCISE PROGRAM: [id number] ASSESSMENT:  CLINICAL IMPRESSION: Improved spO2 and HR with exercise today.  Continued occasional cues for upright chest posture.  She will continue to benefit from skilled therapy with added goals for improved toleration to activity as well as indep monitoring of exertional levels. Progressing well towards remaining goals.   Objective impairments include decreased activity tolerance, decreased endurance, difficulty walking, decreased strength, increased muscle spasms, impaired flexibility, and postural dysfunction. These impairments are limiting patient from cleaning, community activity, and lives independently- has difficulty with endurance to complete ADLs . Personal factors including Fitness, Time since onset of injury/illness/exacerbation, and 3+ comorbidities: h/o breast CA, pneumonia, asthma  are also affecting  patient's functional outcome. Patient will benefit from skilled PT to address above impairments and improve overall function. We stood and talked in the aquatics facility to ensure that pt would tolerate temp and humidity and she verbalized readiness for aquatic treatments.   REHAB POTENTIAL: Good  CLINICAL DECISION MAKING: Unstable/unpredictable  EVALUATION COMPLEXITY: High   GOALS:   SHORT TERM GOALS:  STG Name Target Date Goal status  1 Pt will become independent with HEP in order to demonstrate synthesis of PT education.  06/06/2021  MET   LONG TERM GOALS:   LTG Name Target Date Goal status  1 Pt  will become independent with final HEP in order to demonstrate synthesis of PT education.   07/18/2021  ongoing  2 Pt will be able to perform 5XSTS in under 12s  in order to demonstrate functional improvement above the cut off score for adults.  07/18/2021 MET  3 Pt will be able to walk at least 3280 ft or 1081mduring 6MWT in order to demonstrate improvement in functional endurance and mobility for decrease in all cause mortality.   07/18/2021 Met  4 Pt will be able to demonstrate/report ability to walk >15 mins without pain in order to demonstrate functional improvement and tolerance to exercise and community mobility.  07/18/2021 MET  5 Pt will improve on 6MWT from 12231f to > 1550 ft to demonstrate continued improvement in toleration to exercise and community mobility. 09/05/21 New  6 Pt will understand Borg scale and be indep with monitoring exertional level. 09/05/21 New   PLAN: PT FREQUENCY: 1-2x/week  PT DURATION: 6  PLANNED INTERVENTIONS: Therapeutic exercises, Therapeutic activity, Neuro Muscular re-education, Balance training, Gait training, Patient/Family education, Stair training, Aquatic Therapy, Dry Needling, Spinal mobilization, Cryotherapy, Moist heat, Taping, Traction, and Manual therapy  PLAN FOR NEXT SESSION: continue aquatics; continue with general conditioning,  endurance; UE and LE strength circuits alternate.  (Check end of session for accuracy; not pulling from flow sheet).  JeKerin PernaPTA 08/02/21 11:42 AM

## 2021-08-03 ENCOUNTER — Telehealth (INDEPENDENT_AMBULATORY_CARE_PROVIDER_SITE_OTHER): Payer: Medicare Other | Admitting: Dermatology

## 2021-08-03 DIAGNOSIS — D034 Melanoma in situ of scalp and neck: Secondary | ICD-10-CM

## 2021-08-03 NOTE — Telephone Encounter (Signed)
I called patient as soon as I received the pathology report that she has another melanoma in situ.  Although there was no mention of fibrosis or recurrence, the lesion is adjacent to a scar and I recommended she see Dr. Janann August or Dr. Maple Hudson to have this excised at the [facility].  She was comfortable with this and all her questions were answered.  She has my cell phone if she needs to contact me.

## 2021-08-08 ENCOUNTER — Telehealth: Payer: Self-pay | Admitting: Dermatology

## 2021-08-08 ENCOUNTER — Other Ambulatory Visit: Payer: Self-pay | Admitting: Internal Medicine

## 2021-08-08 ENCOUNTER — Telehealth: Payer: Self-pay

## 2021-08-08 NOTE — Telephone Encounter (Addendum)
Wants to know about surgical appointment: is it here or [facility]? Patient was told [facility] will contact her about appt and that it may take a while

## 2021-08-08 NOTE — Telephone Encounter (Signed)
Referral sent to [facility] for MOH's 

## 2021-08-08 NOTE — Telephone Encounter (Signed)
-----   Message from Sonia Monarch, MD sent at 08/03/2021  4:34 PM EDT ----- I called patient and told her that the biopsy from the small pigmented area above the scar in the middle of her back neck was another melanoma in situ.  We will schedule her to see Dr. Janann August or Dr. Maple Hudson at the [facility] surgery center.

## 2021-08-09 ENCOUNTER — Telehealth: Payer: Self-pay | Admitting: Adult Health

## 2021-08-09 NOTE — Telephone Encounter (Signed)
Called and spoke with pt letting her know that we needed to schedule a visit to further evaluate due to her not any better after recent prednisone. Pt stated that she didn't drive nor did she have a ride. Have scheduled pt for a virtual visit with [facility] 6/2.nothing further needed.

## 2021-08-11 ENCOUNTER — Telehealth: Payer: Medicare Other | Admitting: Nurse Practitioner

## 2021-08-11 ENCOUNTER — Ambulatory Visit (HOSPITAL_BASED_OUTPATIENT_CLINIC_OR_DEPARTMENT_OTHER): Payer: Medicare Other | Attending: Family Medicine | Admitting: Physical Therapy

## 2021-08-11 ENCOUNTER — Encounter (HOSPITAL_BASED_OUTPATIENT_CLINIC_OR_DEPARTMENT_OTHER): Payer: Self-pay | Admitting: Physical Therapy

## 2021-08-11 DIAGNOSIS — R293 Abnormal posture: Secondary | ICD-10-CM | POA: Insufficient documentation

## 2021-08-11 DIAGNOSIS — R262 Difficulty in walking, not elsewhere classified: Secondary | ICD-10-CM | POA: Diagnosis not present

## 2021-08-11 NOTE — Therapy (Signed)
OUTPATIENT PHYSICAL THERAPY TREATMENT  PT End of Session -        Visit Number 17    Number of Visits 25     Date for PT Re-Evaluation 09/05/21     Authorization Type MCR     Progress Note Due on Visit 25     PT Start Time 1452    PT Stop Time 1530    PT Time Calculation (min) 38 min     Activity Tolerance Patient tolerated treatment well     Behavior During Therapy [facility] for tasks assessed/performed         Patient Name: Sonia [name] MRN: [id number] DOB:1940-03-24, 81 y.o., female Today's Date: 07/25/21      Past Medical History:  Diagnosis Date   Arthritis    Asthma    daily and prn inhalers; states good control currently   CAD S/P percutaneous coronary angioplasty 07/15/2016   COPD (chronic obstructive pulmonary disease) (HCC)    GERD (gastroesophageal reflux disease)    no current med.   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   Neck Posterior   Pneumonia    Sclerosing adenosis of left breast 03/2015   Vitamin D deficiency    Past Surgical History:  Procedure Laterality Date   ABDOMINAL HYSTERECTOMY     partial   APPENDECTOMY     BREAST BIOPSY Left    BREAST CYST EXCISION Left    BREAST EXCISIONAL BIOPSY Left 03/2015   BREAST LUMPECTOMY Right 08/2018   BREAST LUMPECTOMY WITH RADIOACTIVE SEED LOCALIZATION Left 04/07/2015   Procedure: BREAST LUMPECTOMY WITH RADIOACTIVE SEED LOCALIZATION;  Surgeon: Erroll Luna, MD;  Location: [place];  Service: General;  Laterality: Left;   BREAST LUMPECTOMY WITH RADIOACTIVE SEED LOCALIZATION Right 08/13/2018   Procedure: RIGHT BREAST LUMPECTOMY WITH RADIOACTIVE SEED LOCALIZATION;  Surgeon: Rolm Bookbinder, MD;  Location: [place];  Service: General;   Laterality: Right;   CORONARY STENT INTERVENTION N/A 07/15/2016   Procedure: Coronary Stent Intervention;  Surgeon: Sherren Mocha, MD;  Location: [place] [facility];  Service: Cardiovascular;  Laterality: N/A;   ESOPHAGOGASTRODUODENOSCOPY (EGD) WITH PROPOFOL Left 07/25/2016   Procedure: ESOPHAGOGASTRODUODENOSCOPY (EGD) WITH PROPOFOL;  Surgeon: Ronnette Juniper, MD;  Location: [place];  Service: Gastroenterology;  Laterality: Left;   KNEE ARTHROSCOPY Right 12/07/2003   LAPAROSCOPIC SALPINGO OOPHERECTOMY Right 09/08/2003   LEFT HEART CATH N/A 07/15/2016   Procedure: Left Heart Cath;  Surgeon: Sherren Mocha, MD;  Location: [place] [facility];  Service: Cardiovascular;  Laterality: N/A;   LEFT HEART CATH AND CORONARY ANGIOGRAPHY N/A 04/14/2019   Procedure: LEFT HEART CATH AND CORONARY ANGIOGRAPHY;  Surgeon: Burnell Blanks, MD;  Location: [place] [facility];  Service: Cardiovascular;  Laterality: N/A;   LYSIS OF ADHESION  09/08/2003   NASAL SINUS SURGERY  age 81   RECTAL POLYPECTOMY  10/02/2006   Patient Active Problem List   Diagnosis Date Noted   Coronary artery disease of native artery of native heart with stable angina pectoris (HCC)    Asthma, chronic, moderate persistent, uncomplicated 43/27/6147   DOE (dyspnea on exertion) 07/31/2018   Osteopenia 06/04/2018   Carcinoma of upper-outer quadrant of right breast in female, estrogen receptor positive (  [place]) 05/30/2018   GI bleed 07/25/2016   Melena 07/24/2016   Acute renal insufficiency 07/24/2016   CAD S/P percutaneous coronary angioplasty 07/16/2016   NSTEMI (non-ST elevated myocardial infarction) ([place]) 07/13/2016   Severe asthma with acute exacerbation 03/07/2016   Genetic testing 06/09/2015   Family history of breast cancer in female 05/10/2015   History of melanoma 05/10/2015   Atypical lobular hyperplasia of left breast 04/26/2015   Vision problem 01/08/2014   Hiatus hernia syndrome 08/01/2012   Palpitations 10/18/2010   Acute  chest pain 10/18/2010   Hypothyroidism 10/18/2010   Dyslipidemia 06/07/2009   Upper airway cough syndrome 03/29/2009   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 no Location:  Pain level: 0/10   LIVING ENVIRONMENT: Lives with: lives with their family, dog Lives in: House/apartment Stairs: No;    OCCUPATION: retired Marine scientist  PLOF: Independent  PATIENT GOALS: Improve endurance and would like to be able to walk more, be able to do more daily functions/chores without being tired   OBJECTIVE:  FOTO   42 at eval 52 at D/C 6 pts MCII  6th Visit FOTO 3/6: 42pts 10th visit FOTO 4/4: 49 pts   FUNCTIONAL TESTS:  Previous: 5 times sit to stand: 13s 6 minute walk test: 1074ft  no breaks needed (97% O2 saturation)    4/4 Progress Note  LE MMT: 4+/5 throughout bilat hips and knees  5 times sit to stand: 9s without UE, no pain   6 minute walk test: 1267ft no breaks needed (97% O2 saturation)    TODAY'S TREATMENT:  Pt seen for aquatic therapy today.  Treatment took place in water 3.25-4.8 ft in depth at the [employer] pool. Temp of water was 92.  Pt entered/exited the pool via stairs independently with bilat rail.  Walking without ue support forward back  Low back stretch with feet on wall;  hip hiking R/L for QL stretch.  Hip abdct , marching, and mini squat x 20each LE, 2 sets and holding to wall  Core noodle pull down x10  Ue: 1 foam hand buoys  Side lunges ue add/abd 4 widths -Horizontal  abd, add/abd and flex /ext x10  Plank on bench with alternating hip ext x 10. Cues for glut iso contraction Plank on yellow noodle holding 3x30s  Pt requires buoyancy for support and to offload joints with strengthening exercises. Viscosity of the water is needed for resistance of strengthening; water current perturbations provides challenge to standing balance unsupported, requiring increased core activation.    PATIENT EDUCATION:  Education details:  exam findings, deficits, goal planning, exercise progression, aquatic therapy,  expectations, muscle firing,  envelope of function, HEP, POC Person educated: Patient Education method: Explanation, Demonstration, Tactile cues, Verbal cues, and Handouts Education comprehension: verbalized understanding, returned demonstration, verbal cues required, tactile cues required, and needs further education   HOME EXERCISE PROGRAM: [id number]   ASSESSMENT:  CLINICAL IMPRESSION: Pt reports MD wanting her to go to emergent care yesterday due to increased breathing difficulties.  She decided not to  and has improved enough to come to therapy today. O2 sats 92 %.  She does not need inhaler throughout treatment although does use afterward.  She tolerated therapy well.  I insisted on 3 rest periods 1 standing and 2 sitting to ensure adequate ventilation. Sats remain in the 90's.  She reports good challenge with plank positioning using noodle. She is able to hold position with good core control until last trial due to fatigue.    Objective impairments include decreased activity tolerance, decreased endurance, difficulty walking, decreased strength, increased muscle spasms, impaired flexibility, and postural dysfunction. These impairments are limiting patient from cleaning, community activity, and lives independently- has difficulty with endurance to complete ADLs . Personal factors including Fitness, Time since onset of injury/illness/exacerbation, and 3+  comorbidities: h/o breast CA, pneumonia, asthma  are also affecting patient's functional outcome. Patient will benefit from skilled PT to address above impairments and improve overall function. We stood and talked in the aquatics facility to ensure that pt would tolerate temp and humidity and she verbalized readiness for aquatic treatments.   REHAB POTENTIAL: Good  CLINICAL DECISION MAKING: Unstable/unpredictable  EVALUATION COMPLEXITY: High   GOALS:   SHORT TERM GOALS:  STG Name Target Date Goal status  1 Pt will become independent with HEP in order to demonstrate synthesis of PT education.  06/06/2021  MET   LONG TERM GOALS:   LTG Name Target Date Goal status  1 Pt  will become independent with final HEP in order to demonstrate synthesis of PT education.   07/18/2021  ongoing  2 Pt will be able to perform 5XSTS in under 12s  in order to demonstrate functional improvement above the cut off score for adults.  07/18/2021 MET  3 Pt will be able to walk at least 3280 ft or 1071m during 6MWT in order to demonstrate improvement in functional endurance and mobility for decrease in all cause mortality.   07/18/2021 Met  4 Pt will be able to demonstrate/report ability to walk >15 mins without pain in order to demonstrate functional improvement and tolerance to exercise and community mobility.  07/18/2021 MET  5 Pt will improve on 6MWT from 125ft  to > 1550 ft to demonstrate continued improvement in toleration to exercise and community mobility. 09/05/21 New  6 Pt will understand Borg scale and be indep with monitoring exertional level. 09/05/21 New   PLAN: PT FREQUENCY: 1-2x/week  PT DURATION: 6  PLANNED INTERVENTIONS: Therapeutic exercises, Therapeutic activity, Neuro Muscular re-education, Balance training, Gait training, Patient/Family education, Stair training, Aquatic Therapy, Dry Needling, Spinal mobilization, Cryotherapy, Moist heat, Taping, Traction, and Manual therapy  PLAN FOR NEXT  SESSION: continue aquatics; continue with general conditioning, endurance; UE and LE strength circuits alternate.  (Check end of session for accuracy; not pulling from flow sheet).  Stanton Kidney Tharon Aquas) Jakayden Cancio MPT  08/11/21 2:55 PM

## 2021-08-13 DIAGNOSIS — R062 Wheezing: Secondary | ICD-10-CM | POA: Diagnosis not present

## 2021-08-13 DIAGNOSIS — Z8709 Personal history of other diseases of the respiratory system: Secondary | ICD-10-CM | POA: Diagnosis not present

## 2021-08-13 DIAGNOSIS — Z03818 Encounter for observation for suspected exposure to other biological agents ruled out: Secondary | ICD-10-CM | POA: Diagnosis not present

## 2021-08-13 DIAGNOSIS — R059 Cough, unspecified: Secondary | ICD-10-CM | POA: Diagnosis not present

## 2021-08-14 ENCOUNTER — Emergency Department (HOSPITAL_COMMUNITY): Payer: Medicare Other

## 2021-08-14 ENCOUNTER — Encounter (HOSPITAL_COMMUNITY): Payer: Self-pay | Admitting: Emergency Medicine

## 2021-08-14 ENCOUNTER — Other Ambulatory Visit: Payer: Self-pay

## 2021-08-14 ENCOUNTER — Emergency Department (HOSPITAL_COMMUNITY)
Admission: EM | Admit: 2021-08-14 | Discharge: 2021-08-14 | Disposition: A | Discharge status: Home / Self Care | Payer: Medicare Other | Attending: Emergency Medicine | Admitting: Emergency Medicine

## 2021-08-14 DIAGNOSIS — R531 Weakness: Secondary | ICD-10-CM | POA: Diagnosis not present

## 2021-08-14 DIAGNOSIS — J449 Chronic obstructive pulmonary disease, unspecified: Secondary | ICD-10-CM | POA: Insufficient documentation

## 2021-08-14 DIAGNOSIS — R42 Dizziness and giddiness: Secondary | ICD-10-CM | POA: Insufficient documentation

## 2021-08-14 DIAGNOSIS — J441 Chronic obstructive pulmonary disease with (acute) exacerbation: Secondary | ICD-10-CM

## 2021-08-14 DIAGNOSIS — I2510 Atherosclerotic heart disease of native coronary artery without angina pectoris: Secondary | ICD-10-CM | POA: Diagnosis not present

## 2021-08-14 DIAGNOSIS — J45909 Unspecified asthma, uncomplicated: Secondary | ICD-10-CM | POA: Diagnosis not present

## 2021-08-14 DIAGNOSIS — Z91040 Latex allergy status: Secondary | ICD-10-CM | POA: Diagnosis not present

## 2021-08-14 DIAGNOSIS — G319 Degenerative disease of nervous system, unspecified: Secondary | ICD-10-CM | POA: Diagnosis not present

## 2021-08-14 DIAGNOSIS — R55 Syncope and collapse: Secondary | ICD-10-CM | POA: Insufficient documentation

## 2021-08-14 DIAGNOSIS — R0602 Shortness of breath: Secondary | ICD-10-CM | POA: Insufficient documentation

## 2021-08-14 DIAGNOSIS — M542 Cervicalgia: Secondary | ICD-10-CM | POA: Diagnosis not present

## 2021-08-14 DIAGNOSIS — R062 Wheezing: Secondary | ICD-10-CM | POA: Diagnosis not present

## 2021-08-14 DIAGNOSIS — Z7951 Long term (current) use of inhaled steroids: Secondary | ICD-10-CM | POA: Insufficient documentation

## 2021-08-14 DIAGNOSIS — Z79899 Other long term (current) drug therapy: Secondary | ICD-10-CM | POA: Diagnosis not present

## 2021-08-14 DIAGNOSIS — K449 Diaphragmatic hernia without obstruction or gangrene: Secondary | ICD-10-CM | POA: Diagnosis not present

## 2021-08-14 DIAGNOSIS — R519 Headache, unspecified: Secondary | ICD-10-CM | POA: Diagnosis not present

## 2021-08-14 LAB — CBC
HCT: 43 % (ref 36.0–46.0)
Hemoglobin: 14.7 g/dL (ref 12.0–15.0)
MCH: 31.5 pg (ref 26.0–34.0)
MCHC: 34.2 g/dL (ref 30.0–36.0)
MCV: 92.1 fL (ref 80.0–100.0)
Platelets: 196 10*3/uL (ref 150–400)
RBC: 4.67 MIL/uL (ref 3.87–5.11)
RDW: 13.8 % (ref 11.5–15.5)
WBC: 10.9 10*3/uL — ABNORMAL HIGH (ref 4.0–10.5)
nRBC: 0 % (ref 0.0–0.2)

## 2021-08-14 LAB — HEPATIC FUNCTION PANEL
ALT: 12 U/L (ref 0–44)
AST: 39 U/L (ref 15–41)
Albumin: 3.3 g/dL — ABNORMAL LOW (ref 3.5–5.0)
Alkaline Phosphatase: 53 U/L (ref 38–126)
Bilirubin, Direct: 0.5 mg/dL — ABNORMAL HIGH (ref 0.0–0.2)
Indirect Bilirubin: 0.4 mg/dL (ref 0.3–0.9)
Total Bilirubin: 0.9 mg/dL (ref 0.3–1.2)
Total Protein: 6.4 g/dL — ABNORMAL LOW (ref 6.5–8.1)

## 2021-08-14 LAB — URINALYSIS, ROUTINE W REFLEX MICROSCOPIC
Bilirubin Urine: NEGATIVE
Glucose, UA: NEGATIVE mg/dL
Hgb urine dipstick: NEGATIVE
Ketones, ur: NEGATIVE mg/dL
Leukocytes,Ua: NEGATIVE
Nitrite: NEGATIVE
Protein, ur: NEGATIVE mg/dL
Specific Gravity, Urine: 1.02 (ref 1.005–1.030)
pH: 5 (ref 5.0–8.0)

## 2021-08-14 LAB — BASIC METABOLIC PANEL
Anion gap: 9 (ref 5–15)
BUN: 22 mg/dL (ref 8–23)
CO2: 25 mmol/L (ref 22–32)
Calcium: 9.3 mg/dL (ref 8.9–10.3)
Chloride: 108 mmol/L (ref 98–111)
Creatinine, Ser: 1.02 mg/dL — ABNORMAL HIGH (ref 0.44–1.00)
GFR, Estimated: 55 mL/min — ABNORMAL LOW (ref >60)
Glucose, Bld: 109 mg/dL — ABNORMAL HIGH (ref 70–99)
Potassium: 4.1 mmol/L (ref 3.5–5.1)
Sodium: 142 mmol/L (ref 135–145)

## 2021-08-14 LAB — CBG MONITORING, ED: Glucose-Capillary: 109 mg/dL — ABNORMAL HIGH (ref 70–99)

## 2021-08-14 LAB — TROPONIN I (HIGH SENSITIVITY): Troponin I (High Sensitivity): 17 ng/L (ref <18)

## 2021-08-14 MED ORDER — IPRATROPIUM-ALBUTEROL 0.5-2.5 (3) MG/3ML IN SOLN
3.0000 mL | Freq: Once | RESPIRATORY_TRACT | Status: AC
  Administered 2021-08-14: 3 mL via RESPIRATORY_TRACT
  Filled 2021-08-14: qty 3

## 2021-08-14 MED ORDER — METHYLPREDNISOLONE SODIUM SUCC 40 MG IJ SOLR
40.0000 mg | Freq: Once | INTRAMUSCULAR | Status: AC
Start: 2021-08-14 — End: 2021-08-14
  Administered 2021-08-14: 40 mg via INTRAVENOUS
  Filled 2021-08-14: qty 1

## 2021-08-14 MED ORDER — ALBUTEROL SULFATE (2.5 MG/3ML) 0.083% IN NEBU
2.5000 mg | INHALATION_SOLUTION | Freq: Once | RESPIRATORY_TRACT | Status: AC
  Administered 2021-08-14: 2.5 mg via RESPIRATORY_TRACT
  Filled 2021-08-14: qty 3

## 2021-08-14 MED ORDER — SODIUM CHLORIDE 0.9 % IV BOLUS
500.0000 mL | Freq: Once | INTRAVENOUS | Status: AC
  Administered 2021-08-14: 500 mL via INTRAVENOUS

## 2021-08-14 NOTE — ED Provider Notes (Signed)
[facility] EMERGENCY DEPARTMENT Provider Note   CSN: [id number] Arrival date & time: 08/14/21  1245     History  Chief Complaint  Patient presents with   Dizziness    Sonia [name] is a 81 y.o. female with medical history of COPD, asthma, Hashimoto's thyroiditis, CAD status post percutaneous stenting.  Presents to the emergency department a chief complaint of dizziness, near syncope, and gait instability.    Patient reports that this morning at approximately 8 AM she sat up in bed when she was struck with a sudden episode of dizziness and lightheadedness.  Patient states that she laid back in bed with improvement in her symptoms.  Patient says she again tried to sit up and had a similar episode.  Patient also reports feeling "a sensation," in her neck prior to these 2 episodes.    Patient states that she called her PCP and a friend who advised her to come to the emergency department.  Patient states that she was able to stand and ambulate to open her door however states that she felt "heavy," and needed to hold onto objects in her house to keep from falling over.  Patient states that her dizziness and lightheadedness have since resolved.  Patient reports that at present she feels generalized weakness.  Patient also reports that in the last 2 weeks of May she had an episode of right vision changes.  Patient states that her vision blacked out to pinpoint and then spontaneously resolved.  Patient has not been evaluated by an ophthalmologist or her PCP for this issue since then.  Patient reports that she did just start prednisone yesterday due to cough and asthma exacerbation.  Patient does endorse shortness of breath at baseline.  Shortness of breath is unchanged today.  Denies any visual disturbance, facial asymmetry, dysarthria, numbness, focal weakness, chest pain, palpitations, syncope, recent falls or traumatic injuries.   Dizziness Associated symptoms:  shortness of breath (baseline)   Associated symptoms: no chest pain, no headaches, no nausea, no vomiting and no weakness       Home Medications Prior to Admission medications   Medication Sig Start Date End Date Taking? Authorizing Provider  albuterol (PROVENTIL) (2.5 MG/3ML) 0.083% nebulizer solution Take 3 mLs (2.5 mg total) by nebulization every 6 (six) hours as needed for wheezing or shortness of breath. 05/29/21   Tanda Rockers, MD  albuterol (VENTOLIN HFA) 108 (90 Base) MCG/ACT inhaler Inhale 2 puffs into the lungs every 6 (six) hours as needed for wheezing or shortness of breath. 08/08/21   Tanda Rockers, MD  budesonide (PULMICORT) 0.25 MG/2ML nebulizer solution 2 mL    [provider]  calcium carbonate (TUMS - DOSED IN MG ELEMENTAL CALCIUM) 500 MG chewable tablet Chew 500 mg by mouth daily as needed for indigestion or heartburn.    [provider]  cetirizine (ZYRTEC) 10 MG tablet Take 10 mg by mouth daily.    [provider]  Cholecalciferol 25 MCG (1000 UT) capsule Take 10,000 Units by mouth 3 (three) times a week.    [provider]  cromolyn (NASALCROM) 5.2 MG/ACT nasal spray Place 2 sprays into both nostrils daily as needed for allergies.    [provider]  Cyanocobalamin (B-12) 5000 MCG CAPS Take 5,000 mcg by mouth every Monday, Wednesday, and Friday.    [provider]  levothyroxine (SYNTHROID) 112 MCG tablet Synthroid 112 mcg tablet  TAKE 1 TABLET BY MOUTH ONCE DAILY IN THE MORNING  ON AN EMPTY STOMACH    [provider]  levothyroxine (SYNTHROID) 125 MCG tablet Take by mouth. 07/30/21   [provider]  montelukast (SINGULAIR) 10 MG tablet Take 1 tablet (10 mg total) by mouth at bedtime. 06/02/21   Parrett, Fonnie Mu, NP  nitroGLYCERIN (NITROSTAT) 0.4 MG SL tablet Place 0.4 mg under the tongue every 5 (five) minutes as needed for chest pain.    [provider]  predniSONE (DELTASONE) 20 MG tablet 3  tabs po day one, then 2 tabs daily x 4 days Patient not taking: Reported on 06/14/2021 06/03/21   Mesner, Corene Cornea, MD  predniSONE (DELTASONE) 20 MG tablet Take 1 tablet (20 mg total) by mouth daily with breakfast. 07/13/21   Parrett, Fonnie Mu, NP  rosuvastatin (CRESTOR) 5 MG tablet 1 tablet    [provider]  SYMBICORT 160-4.5 MCG/ACT inhaler INHALE 2 PUFFS BY MOUTH FIRST THING IN THE MORNING AND THEN 2 PUFFS ABOUT 12 HOURS LATER 07/10/21   Tanda Rockers, MD  tretinoin (RETIN-A) 0.1 % cream Apply topically at bedtime. 02/01/20   [provider]      Allergies    Adenosine, Contrast media [iodinated contrast media], Codeine sulfate, and Latex    Review of Systems   Review of Systems  Constitutional:  Negative for chills and fever.  HENT:  Negative for facial swelling.   Eyes:  Negative for visual disturbance.  Respiratory:  Positive for shortness of breath (baseline).   Cardiovascular:  Negative for chest pain.  Gastrointestinal:  Negative for abdominal pain, nausea and vomiting.  Genitourinary:  Negative for difficulty urinating and dysuria.  Musculoskeletal:  Positive for neck pain. Negative for back pain.  Skin:  Negative for color change and rash.  Neurological:  Positive for dizziness and light-headedness. Negative for tremors, seizures, syncope, facial asymmetry, speech difficulty, weakness, numbness and headaches.  Psychiatric/Behavioral:  Negative for confusion.    Physical Exam Updated Vital Signs BP (!) 170/92 (BP Location: Right Arm)   Pulse 84   Temp 97.6 F (36.4 C) (Oral)   Resp 17   SpO2 97%  Physical Exam Vitals and nursing note reviewed.  Constitutional:      General: She is not in acute distress.    Appearance: She is not ill-appearing, toxic-appearing or diaphoretic.  Eyes:     General:        Right eye: No discharge.        Left eye: No discharge.     Extraocular Movements: Extraocular movements intact.     Conjunctiva/sclera: Conjunctivae  normal.     Pupils: Pupils are equal, round, and reactive to light.  Cardiovascular:     Rate and Rhythm: Normal rate.     Pulses:          Radial pulses are 2+ on the right side and 2+ on the left side.  Pulmonary:     Effort: Pulmonary effort is normal.  Musculoskeletal:     Cervical back: Normal range of motion and neck supple. No rigidity.  Skin:    General: Skin is warm and dry.  Neurological:     General: No focal deficit present.     Mental Status: She is alert.     GCS: GCS eye subscore is 4. GCS verbal subscore is 5. GCS motor subscore is 6.     Cranial Nerves: No cranial nerve deficit or facial asymmetry.     Sensory: Sensation is intact. No sensory deficit.  Motor: No weakness, tremor, seizure activity or pronator drift.     Coordination: Finger-Nose-Finger Test normal.     Comments: CN II-XII intact; performed in supine position due to reports of dizziness and near syncope, +5 strength to bilateral upper extremities, +5 strength to dorsiflexion and plantarflexion, patient able to lift both legs against gravity and hold each there without difficulty   Psychiatric:        Behavior: Behavior is cooperative.    ED Results / Procedures / Treatments   Labs (all labs ordered are listed, but only abnormal results are displayed) Labs Reviewed  BASIC METABOLIC PANEL - Abnormal; Notable for the following components:      Result Value   Glucose, Bld 109 (*)    Creatinine, Ser 1.02 (*)    GFR, Estimated 55 (*)    All other components within normal limits  CBC - Abnormal; Notable for the following components:   WBC 10.9 (*)    All other components within normal limits  HEPATIC FUNCTION PANEL - Abnormal; Notable for the following components:   Total Protein 6.4 (*)    Albumin 3.3 (*)    Bilirubin, Direct 0.5 (*)    All other components within normal limits  CBG MONITORING, ED - Abnormal; Notable for the following components:   Glucose-Capillary 109 (*)    All other  components within normal limits  URINALYSIS, ROUTINE W REFLEX MICROSCOPIC  TROPONIN I (HIGH SENSITIVITY)    EKG None  Radiology MR BRAIN WO CONTRAST  Result Date: 08/14/2021 CLINICAL DATA:  Neuro deficit, acute, stroke suspected. Dizziness and near syncope. EXAM: MRI HEAD WITHOUT CONTRAST TECHNIQUE: Multiplanar, multiecho pulse sequences of the brain and surrounding structures were obtained without intravenous contrast. COMPARISON:  Head MRI 09/22/2017 FINDINGS: Brain: There is no evidence of an acute infarct, intracranial hemorrhage, midline shift, or extra-axial fluid collection. Prominent asymmetric dural ossification is again noted anteriorly over the right frontal convexity. T2 hyperintensities in the cerebral white matter and pons have mildly progressed and are nonspecific but compatible with moderate chronic small vessel ischemic disease. Mild cerebral atrophy is within normal limits for age. There is a mildly enlarged partially empty sella. Vascular: Major intracranial vascular flow voids are preserved. Skull and upper cervical spine: Unremarkable bone marrow signal. Sinuses/Orbits: Unremarkable orbits. Mild mucosal thickening in the paranasal sinuses. Clear mastoid air cells. Other: None. IMPRESSION: 1. No acute intracranial abnormality. 2. Moderate chronic small vessel ischemic disease. Electronically Signed   By: Logan Bores M.D.   On: 08/14/2021 13:55    Procedures Procedures    Medications Ordered in ED Medications - No data to display  ED Course/ Medical Decision Making/ A&P Clinical Course as of 08/14/21 1510  Mon Aug 14, 2021  1509 I was contacted by patient's RN who advised that patient is complaining of shortness of breath and wheezing.  Patient does have history of COPD and asthma.  States that she uses albuterol neb treatment twice daily.  Patient used her albuterol inhaler in the emergency department with no improvement in her symptoms.  Will place order for butyryl  nebulizer treatments time as patient has diffuse wheezing throughout. [PB]    Clinical Course User Index [PB] Loni Beckwith, PA-C                           Medical Decision Making Amount and/or Complexity of Data Reviewed Labs: ordered. Radiology: ordered.  Risk Prescription drug management.   Alert  81 year old female in no acute distress, nontoxic-appearing.  Presents to the ED with a chief complaint of dizziness, lightheadedness, and gait instability.  Information obtained from patient.  I reviewed patient's past medical records including previous provider notes, labs, and imaging.  Patient has medical history as outlined in HPI which complicates her care.  Patient has no focal neurological deficits at this time however with reports of gait instability and recent visual disturbance concern for possible CVA.  Will obtain noncontrast head CT as well as MRI. Due to reports of near syncope will obtain EKG, troponin, chest x-ray and additional lab work.  I personally viewed and interpreted patient's EKG.  Tracing shows sinus rhythm  I personally viewed and interpreted patient's lab results.  Pertinent findings include: -Troponin 17 -No large electrolyte abnormalities -No leukocytosis or anemia  MRI imaging shows no acute intracranial abnormality.  We will plan to stand and ambulate patient.  If she is able to stand and ambulate without resumption of symptoms we will plan to discharge to follow-up with neurology in the outpatient setting.  Urinalysis and checks x-ray pending at this time.  Patient care transferred to PA [facility] at the end of my shift. Patient presentation, ED course, and plan of care discussed with review of all pertinent labs and imaging. Please see his/her note for further details regarding further ED course and disposition.         Final Clinical Impression(s) / ED Diagnoses Final diagnoses:  None    Rx / DC Orders ED Discharge Orders     None          Loni Beckwith, PA-C 08/14/21 1511    Pattricia Boss, MD 08/16/21 1640

## 2021-08-14 NOTE — Discharge Instructions (Signed)
Like we discussed, please continue to take your prescribed prednisone.  Please continue to use your nebulizers as needed for your wheezing and shortness of breath.  Please follow-up with your primary care provider regarding your symptoms as well as this visit today.  If you develop any new or worsening symptoms please come back to the emergency department.

## 2021-08-14 NOTE — ED Triage Notes (Addendum)
Patient BIB [facility] from home with complaint of intermittent dizziness and near syncope that started at 0830 today, history of vertigo. Patient denies dizziness at this time. Patient is alert, oriented, and in no apparent distress at this time. No vision changes, no facial droop, no dysarthria, no arm drift, sensation equal bilaterally. 18g saline lock in right AC, received '4mg'$  zofran from EMS PTA.  EMS vitals 99% on 2L O2 [place] 132/90 HR 94 CBG 111

## 2021-08-14 NOTE — ED Provider Notes (Signed)
Patient is an 81 year old female whose care was transferred to me at shift change from [facility].  Please see his HPI below:  Sonia [name] is a 81 y.o. female with medical history of COPD, asthma, Hashimoto's thyroiditis, CAD status post percutaneous stenting.  Presents to the emergency department a chief complaint of dizziness, near syncope, and gait instability.     Patient reports that this morning at approximately 8 AM she sat up in bed when she was struck with a sudden episode of dizziness and lightheadedness.  Patient states that she laid back in bed with improvement in her symptoms.  Patient says she again tried to sit up and had a similar episode.  Patient also reports feeling "a sensation," in her neck prior to these 2 episodes.     Patient states that she called her PCP and a friend who advised her to come to the emergency department.  Patient states that she was able to stand and ambulate to open her door however states that she felt "heavy," and needed to hold onto objects in her house to keep from falling over.  Patient states that her dizziness and lightheadedness have since resolved.  Patient reports that at present she feels generalized weakness.   Patient also reports that in the last 2 weeks of May she had an episode of right vision changes.  Patient states that her vision blacked out to pinpoint and then spontaneously resolved.  Patient has not been evaluated by an ophthalmologist or her PCP for this issue since then.   Patient reports that she did just start prednisone yesterday due to cough and asthma exacerbation.  Patient does endorse shortness of breath at baseline.  Shortness of breath is unchanged today.   Denies any visual disturbance, facial asymmetry, dysarthria, numbness, focal weakness, chest pain, palpitations, syncope, recent falls or traumatic injuries.     Dizziness Associated symptoms: shortness of breath (baseline)   Associated symptoms: no  chest pain, no headaches, no nausea, no vomiting and no weakness   Physical Exam  BP 135/65   Pulse 80   Temp 97.6 F (36.4 C) (Oral)   Resp 15   SpO2 98%   Physical Exam Vitals and nursing note reviewed.  Constitutional:      General: She is not in acute distress.    Appearance: She is well-developed.  HENT:     Head: Normocephalic and atraumatic.     Right Ear: External ear normal.     Left Ear: External ear normal.  Eyes:     General: No scleral icterus.       Right eye: No discharge.        Left eye: No discharge.     Conjunctiva/sclera: Conjunctivae normal.  Neck:     Trachea: No tracheal deviation.  Cardiovascular:     Rate and Rhythm: Normal rate.  Pulmonary:     Effort: Pulmonary effort is normal. No respiratory distress.     Breath sounds: No stridor.  Abdominal:     General: There is no distension.  Musculoskeletal:        General: No swelling or deformity.     Cervical back: Neck supple.  Skin:    General: Skin is warm and dry.     Findings: No rash.  Neurological:     General: No focal deficit present.     Mental Status: She is alert and oriented to person, place, and time.     Cranial Nerves: Cranial nerve deficit:  no gross deficits.   Procedures  Procedures  ED Course / MDM   Clinical Course as of 08/14/21 1759  Mon Aug 14, 2021  1509 I was contacted by patient's RN who advised that patient is complaining of shortness of breath and wheezing.  Patient does have history of COPD and asthma.  States that she uses albuterol neb treatment twice daily.  Patient used her albuterol inhaler in the emergency department with no improvement in her symptoms.  Will place order for butyryl nebulizer treatments time as patient has diffuse wheezing throughout. [PB]    Clinical Course User Index [PB] Loni Beckwith, PA-C   Medical Decision Making Amount and/or Complexity of Data Reviewed Labs: ordered. Radiology: ordered.  Risk Prescription drug  management.  Patient is an 81 year old female whose care was transferred to me by [facility].  Please see his note for additional information.  In summary, patient presents with dizziness, lightheadedness, as well as gait instability.  Previous team noted that patient had no focal neurological deficits but given her complaints, age, and comorbidities there was concern for possible underlying CVA.  ECG showed sinus rhythm.  Troponin of 17.  No electrolyte abnormalities.  No leukocytosis or anemia.  MRI of the brain was obtained which shows no acute intracranial abnormality.  Patient attempted ambulation but was experiencing significant wheezing and shortness of breath at that time.  Patient notes a history of COPD and asthma.  She has to use nebulizers twice per day at baseline.  She also notes that she has had more difficulty recently breathing due to the change in weather.  She was seen at the walk-in clinic of her PCPs office yesterday and started on a prednisone taper.  At the time of shift change patient pending nebulizer, chest x-ray, and reassessment.  Chest x-ray has since resulted with findings as noted below:  IMPRESSION:  1. No active disease.  2. Large hiatal hernia.  3.  Aortic Atherosclerosis (ICD10-I70.0).   After single DuoNeb patient reported mild improvement but was still having extensive wheezing.  She was given a second DuoNeb as well as 40 mg of Solu-Medrol.  On reassessment she states that she is feeling much better.  She was ambulated by the nursing staff and was able to ambulate without difficulty.  She appears stable for discharge at this time and she is agreeable.  We discussed return precautions.  Recommended PCP follow-up.  Recommended continued use of her prednisone.  Her questions were answered and she was amicable at the time of discharge.       Rayna Sexton, PA-C [id number]/86 [id number]    Lianne Cure, DO 50/93/26 [id number]

## 2021-08-14 NOTE — ED Notes (Signed)
Nurse tech ambulated pt. O2 levels stayed between 89-92

## 2021-08-18 ENCOUNTER — Telehealth: Payer: Self-pay

## 2021-08-18 DIAGNOSIS — G441 Vascular headache, not elsewhere classified: Secondary | ICD-10-CM | POA: Diagnosis not present

## 2021-08-18 DIAGNOSIS — M353 Polymyalgia rheumatica: Secondary | ICD-10-CM | POA: Diagnosis not present

## 2021-08-18 NOTE — Telephone Encounter (Signed)
Pt called to inquire about her upcoming appt and if any restrictions were applicable. Returned pt's call, let her know there were no restrictions necessary for that appt, and that the provider would answer any questions about the procedure at the time of visit. Pt confirmed understanding.

## 2021-08-18 NOTE — Telephone Encounter (Signed)
Dr Marygrace Drought with [facility] called with an urgent referral for this pt following an eye exam in his office today. 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 Pt still symptomatic with headache and pulsing sensation in temple. Dr Trudie Reed started pt on Prednisone and recommended a temporal biopsy to r/o GCA. Appt scheduled on 6/12 with Dr Trula Slade. Dr Mellissa Kohut office to fax notes.

## 2021-08-21 ENCOUNTER — Encounter: Payer: Self-pay | Admitting: Surgery

## 2021-08-21 ENCOUNTER — Ambulatory Visit (INDEPENDENT_AMBULATORY_CARE_PROVIDER_SITE_OTHER): Payer: Medicare Other | Admitting: Surgery

## 2021-08-21 VITALS — BP 136/90 | HR 80 | Temp 98.2°F | Resp 20 | Ht 64.0 in | Wt 143.0 lb

## 2021-08-21 DIAGNOSIS — M316 Other giant cell arteritis: Secondary | ICD-10-CM

## 2021-08-21 NOTE — Progress Notes (Signed)
Vascular and Vein Specialist of [facility]  Patient name: Sonia [name] MRN: [id number] DOB: 1940/11/29 Sex: female   REQUESTING PROVIDER:    Dr. Satira Sark   REASON FOR CONSULT:    Eval for temporal arteritis  HISTORY OF PRESENT ILLNESS:   Sonia [name] is a 81 y.o. female, who is referred for temporal artery biopsy.  She has been having headaches and a pulsatile feeling in her right temporal region.  She has not had any vision loss.  She did have an episode of what she describes as a violent dizziness requiring her to go to the hospital where she lost feeling in both of her legs.  She is on chronic steroids for asthma, however this has been increased.  PAST MEDICAL HISTORY    Past Medical History:  Diagnosis Date   Arthritis    Asthma    daily and prn inhalers; states good control currently   CAD S/P percutaneous coronary angioplasty 07/15/2016   COPD (chronic obstructive pulmonary disease) (HCC)    GERD (gastroesophageal reflux disease)    no current med.   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 15s   Heart attack Brother 4   Alzheimer's disease Brother 61   Breast cancer Maternal Aunt 70   Stomach cancer Maternal Grandfather 48   Asthma Sister    Memory loss Sister    Heart attack Son 45   Spinal muscular atrophy  Grandchild        type II   Cancer Other 65       niece dx. ca of salivary gland    Breast cancer Other 26       niece; negative genetic testing 10 years ago   Melanoma Other    Alzheimer's disease Maternal Aunt    Cervical cancer Cousin        maternal 1st cousin dx. at 15   Breast cancer Cousin 53       maternal 1st cousin   Lung cancer Cousin 48       maternal 1st cousin; smoker   Heart disease Cousin    Leukemia Cousin 14       paternal 1st cousin   Breast cancer Other        paternal great grandmother (PGF's mother) dx. 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  Tobacco Use   Smoking status: Never   Smokeless tobacco: Never  Vaping Use   Vaping Use: Never used  Substance and Sexual Activity   Alcohol use: Not Currently    Comment: rarely   Drug use: No   Sexual activity: Not on file  Other Topics Concern   Not on file  Social History Narrative   Not on file   Social Determinants of Health   Financial Resource Strain: Not on file  Food Insecurity: Not on file  Transportation Needs: Not on file  Physical Activity: Not on file  Stress: Not on file  Social Connections: Not on file  Intimate Partner Violence: Not on file    ALLERGIES:    Allergies  Allergen Reactions   Adenosine Anaphylaxis   Contrast Media [Iodinated Contrast Media] Anaphylaxis   Influenza Virus Vaccine Anaphylaxis   Nitrofurantoin Itching   Lac Bovis     Other reaction(s): Other Other Reaction: Other reaction   Codeine Sulfate Nausea And Vomiting   Latex Other (See Comments)    Skin irritation    CURRENT MEDICATIONS:    Current Outpatient Medications   Medication Sig Dispense Refill   albuterol (PROVENTIL) (2.5 MG/3ML) 0.083% nebulizer solution Take 3 mLs (2.5 mg total) by nebulization every 6 (six) hours as needed for wheezing or shortness of breath. 360 mL 11   albuterol (VENTOLIN HFA) 108 (90 Base) MCG/ACT inhaler Inhale 2 puffs into the lungs every 6 (six) hours as needed for wheezing or shortness of breath. 27 g 0   budesonide (PULMICORT) 0.25 MG/2ML nebulizer solution 2 mL     calcium carbonate (TUMS - DOSED IN MG ELEMENTAL CALCIUM) 500 MG chewable tablet Chew 500 mg by mouth daily as needed for indigestion or heartburn.     cetirizine (ZYRTEC) 10 MG tablet Take 10 mg by mouth daily.     Cholecalciferol 25 MCG (1000 UT) capsule Take 10,000 Units by mouth 3 (three) times a week.     cromolyn (NASALCROM) 5.2 MG/ACT nasal spray Place 2 sprays into both nostrils daily as needed for allergies.     Cyanocobalamin (B-12) 5000 MCG CAPS Take 5,000 mcg by mouth every Monday, Wednesday, and Friday.     levothyroxine (SYNTHROID) 112 MCG tablet Synthroid 112 mcg tablet  TAKE 1 TABLET BY MOUTH ONCE DAILY IN THE MORNING ON AN EMPTY STOMACH     nitroGLYCERIN (NITROSTAT) 0.4 MG SL tablet Place 0.4 mg under the tongue every 5 (five) minutes as needed for chest pain.     predniSONE (DELTASONE) 20 MG tablet Take 1 tablet (20 mg total) by mouth daily with breakfast. 5 tablet 0   rosuvastatin (CRESTOR) 5 MG tablet 1 tablet     SYMBICORT 160-4.5 MCG/ACT inhaler INHALE 2 PUFFS BY MOUTH FIRST THING IN THE MORNING AND THEN 2 PUFFS ABOUT 12 HOURS LATER 33 g 3   tretinoin (RETIN-A) 0.1 % cream Apply topically at bedtime.     montelukast (SINGULAIR) 10 MG tablet Take 1 tablet (10 mg total) by mouth at bedtime. (Patient not taking: Reported on 08/21/2021) 30 tablet 11   No current facility-administered medications for this visit.    REVIEW OF SYSTEMS:   '[X]'$  denotes positive finding, '[ ]'$  denotes negative finding Cardiac  Comments:  Chest pain or chest pressure:     Shortness of breath upon exertion:    Short of breath when lying flat:    Irregular heart rhythm:  Vascular    Pain in calf, thigh, or hip brought on by ambulation:    Pain in feet at night that wakes you up from your sleep:     Blood clot in your veins:    Leg swelling:         Pulmonary    Oxygen at home:    Productive cough:     Wheezing:         Neurologic    Sudden weakness in arms or legs:     Sudden numbness in arms or legs:     Sudden onset of difficulty speaking or slurred speech:    Temporary loss of vision in one eye:     Problems with dizziness:         Gastrointestinal    Blood in stool:      Vomited blood:         Genitourinary    Burning when urinating:     Blood in urine:        Psychiatric    Major depression:         Hematologic    Bleeding problems:    Problems with blood clotting too easily:        Skin    Rashes or ulcers:        Constitutional    Fever or chills:     PHYSICAL EXAM:   Vitals:   08/21/21 1044  BP: 136/90  Pulse: 80  Resp: 20  Temp: 98.2 F (36.8 C)  SpO2: 97%  Weight: 143 lb (64.9 kg)  Height: '5\' 4"'$  (1.626 m)    GENERAL: The patient is a well-nourished female, in no acute distress. The vital signs are documented above. CARDIAC: There is a regular rate and rhythm.  VASCULAR: Easily palpable right temporal pulse PULMONARY: Nonlabored respirations MUSCULOSKELETAL: There are no major deformities or cyanosis. NEUROLOGIC: No focal weakness or paresthesias are detected. SKIN: There are no ulcers or rashes noted. PSYCHIATRIC: The patient has a normal affect.  STUDIES:   none  ASSESSMENT and PLAN   Possible temporal arteritis: I discussed with the patient that my role in this would be to perform a right temporal artery biopsy.  She has many questions about treatment and diagnosis that I cannot answer and so I am going to get her back into see her primary care physician later this afternoon so that we can  decide whether or not to proceed with right temporal artery biopsy.  I did discuss the details of the operation including the risks and benefits.  This would be an outpatient procedure and would help determine the length of time of elevated steroids.   Leia Alf, MD, FACS [facility]ists of [facility] [phone] Pager [phone]

## 2021-08-21 NOTE — Telephone Encounter (Signed)
Patient aware she is still in review for [facility] no appointment has been made as of today.

## 2021-08-23 ENCOUNTER — Ambulatory Visit (HOSPITAL_BASED_OUTPATIENT_CLINIC_OR_DEPARTMENT_OTHER): Payer: Medicare Other | Admitting: Physical Therapy

## 2021-08-23 ENCOUNTER — Other Ambulatory Visit: Payer: Self-pay

## 2021-08-23 ENCOUNTER — Encounter: Payer: Self-pay | Admitting: Dermatology

## 2021-08-23 DIAGNOSIS — M316 Other giant cell arteritis: Secondary | ICD-10-CM

## 2021-08-23 NOTE — Progress Notes (Signed)
   Follow-Up Visit   Subjective  Sonia [name] is a 81 y.o. female who presents for the following: Annual Exam (Here for annual skin exam. Concerns posterior neck. Possible SK. History of melanoma. ).  Possible new spots back of neck, check other areas Location:  Duration:  Quality:  Associated Signs/Symptoms: Modifying Factors:  Severity:  Timing: Context:   Objective  Well appearing patient in no apparent distress; mood and affect are within normal limits. Neck - Posterior 2 toned 6 mm macule with dermoscopic atypia, located at the upper border in the center of an old scar.     right posterior neck 5 mm monochrome tan macule without dermoscopic atypia but of concern to patient because it is new and she has a history of multiple primary melanomas.       A full examination was performed including scalp, head, eyes, ears, nose, lips, neck, chest, axillae, abdomen, back, buttocks, bilateral upper extremities, bilateral lower extremities, hands, feet, fingers, toes, fingernails, and toenails. All findings within normal limits unless otherwise noted below.  Areas beneath undergarments not fully examined.   Assessment & Plan    Neoplasm of uncertain behavior of skin (2) Neck - Posterior  Skin / nail biopsy Type of biopsy: tangential   Informed consent: discussed and consent obtained   Timeout: patient name, date of birth, surgical site, and procedure verified   Anesthesia: the lesion was anesthetized in a standard fashion   Anesthetic:  1% lidocaine w/ epinephrine 1-100,000 local infiltration Instrument used: flexible razor blade   Hemostasis achieved with: ferric subsulfate   Outcome: patient tolerated procedure well   Post-procedure details: wound care instructions given    Specimen 1 - Surgical pathology Differential Diagnosis: atypia  Check Margins: yes  right posterior neck  Skin / nail biopsy Type of biopsy: tangential   Informed consent:  discussed and consent obtained   Timeout: patient name, date of birth, surgical site, and procedure verified   Anesthesia: the lesion was anesthetized in a standard fashion   Anesthetic:  1% lidocaine w/ epinephrine 1-100,000 local infiltration Instrument used: flexible razor blade   Hemostasis achieved with: ferric subsulfate   Outcome: patient tolerated procedure well   Post-procedure details: wound care instructions given    Specimen 2 - Surgical pathology Differential Diagnosis: atypia  Check Margins: yes  AK (actinic keratosis) Left Upper Cutaneous Lip  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  The documentation on 08/23/21 for the exam, diagnosis, procedures, and orders are all accurate and complete.

## 2021-08-25 DIAGNOSIS — Z85820 Personal history of malignant melanoma of skin: Secondary | ICD-10-CM | POA: Diagnosis not present

## 2021-08-25 DIAGNOSIS — E063 Autoimmune thyroiditis: Secondary | ICD-10-CM | POA: Diagnosis not present

## 2021-08-25 DIAGNOSIS — C50911 Malignant neoplasm of unspecified site of right female breast: Secondary | ICD-10-CM | POA: Diagnosis not present

## 2021-08-25 DIAGNOSIS — I2510 Atherosclerotic heart disease of native coronary artery without angina pectoris: Secondary | ICD-10-CM | POA: Diagnosis not present

## 2021-08-25 DIAGNOSIS — I129 Hypertensive chronic kidney disease with stage 1 through stage 4 chronic kidney disease, or unspecified chronic kidney disease: Secondary | ICD-10-CM | POA: Diagnosis not present

## 2021-08-25 DIAGNOSIS — N183 Chronic kidney disease, stage 3 unspecified: Secondary | ICD-10-CM | POA: Diagnosis not present

## 2021-08-25 DIAGNOSIS — E038 Other specified hypothyroidism: Secondary | ICD-10-CM | POA: Diagnosis not present

## 2021-08-25 DIAGNOSIS — M353 Polymyalgia rheumatica: Secondary | ICD-10-CM | POA: Diagnosis not present

## 2021-08-28 ENCOUNTER — Encounter (HOSPITAL_COMMUNITY): Payer: Self-pay | Admitting: Emergency Medicine

## 2021-08-28 ENCOUNTER — Encounter (HOSPITAL_COMMUNITY): Payer: Self-pay | Admitting: Surgery

## 2021-08-28 ENCOUNTER — Telehealth: Payer: Self-pay

## 2021-08-28 ENCOUNTER — Ambulatory Visit (HOSPITAL_BASED_OUTPATIENT_CLINIC_OR_DEPARTMENT_OTHER): Payer: Medicare Other | Admitting: Physical Therapy

## 2021-08-28 NOTE — Telephone Encounter (Signed)
Received a call from patient requesting to cancel temporal artery biopsy surgery on tomorrow due to GI symptoms.   Also, received notification from Albin Felling., PA with [facility] pre-admission testing that patient will need cardiac clearance due reports of chest tightness, inability to exercise, h/o CAD w/stent and last seen Dr. Angelena Form over 2 years. Our office will work on obtaining cardiology appointment for patient. Patient made aware and verbalized understanding.

## 2021-08-28 NOTE — Progress Notes (Signed)
Anesthesia Chart Review:  Pt is a same day work up   Case: [id number] Date/Time: 08/30/21 1458   Procedure: BIOPSY TEMPORAL ARTERY (Right)   Anesthesia type: Choice   Pre-op diagnosis: Temporal arteritis   Location: [facility] OR ROOM 11 / [place] [facility]   Surgeons: Serafina Mitchell, MD       DISCUSSION: Pt is 81 years old with hx CAD (NSTEMI, stent to RCA 2018), asthma, COPD, melanoma, breast cancer  Pt has not seen cardiology in over 2 years. I spoke with her by telephone. She reports she does sometimes have chest tightness but she attributes this to her severe asthma. She is not able to exercise or be active as it is too taxing for her. She tried walking in a pool twice a week this past April but it was too much for her. She also shared she presently has diarrhea and plans to cancel surgery until she is better.   I notified Kea in Dr. Stephens Shire office pt should see cardiology prior to rescheduling procedure.    PROVIDERS: - PCP is Glenis Smoker, MD - Used to see cardiologist [name], MD. Last virtual visit 06/12/19. 12 month f/u recommended but has not happened.  - Pulmonology care at [facility]. Last office visit 06/14/21 with Rexene Edison, NP  LABS: Labs reviewed: Acceptable for surgery. - CBC 08/14/21: WBC 10.9, otherwise normal - BMP 08/14/21: glucose 109, Cr 1.02 - HFP 08/14/21: albumin 3.3, direct bilirubin 0.5   IMAGES: 1 view CXR 08/14/21:  1. No active disease. 2. Large hiatal hernia. 3.  Aortic Atherosclerosis   CT angio chest 06/03/21:  1. No acute intrathoracic pathology. No CT evidence of pulmonary artery embolus. 2. Large hiatal hernia. 3. Colonic diverticulosis. 4. Aortic Atherosclerosis    EKG 08/14/21: Sinus rhythm. Probable lateral infarct, age indeterminate   CV: Cardiac cath 04/14/19:  Ost LM to LM lesion is 25% stenosed. Prox RCA to Mid RCA lesion is 10% stenosed. The left ventricular systolic function is normal. LV end diastolic pressure is  normal. The left ventricular ejection fraction is greater than 65% by visual estimate. There is no mitral valve regurgitation. Prox LAD lesion is 50% stenosed. 1. Patent stent proximal to mid RCA with minimal restenosis 2. Moderate mid LAD stenosis, unchanged from last cath. This does not appear to be flow limiting.  3. Mild luminal irregularities in the moderate caliber, non-dominant Circumflex artery.  4. Normal LV systolic function   Echo 08/16/65:  - Left ventricle: The cavity size was normal. Systolic function was normal. The estimated ejection fraction was in the range of 60% to 65%. Wall motion was normal; there were no regional wall motion abnormalities. Features are consistent with a pseudonormal left ventricular filling pattern, with concomitant abnormal relaxation and increased filling pressure (grade 2 diastolic dysfunction). Doppler parameters are consistent with high ventricular filling pressure.  - Mitral valve: Calcified annulus. There was mild regurgitation.  - Tricuspid valve: There was mild regurgitation.  - Pulmonic valve: There was trivial regurgitation.  - Pulmonary arteries: PA peak pressure: 37 mm Hg (S).    Past Medical History:  Diagnosis Date   Arthritis    Asthma    daily and prn inhalers; states good control currently   CAD S/P percutaneous coronary angioplasty 07/15/2016   COPD (chronic obstructive pulmonary disease) (HCC)    GERD (gastroesophageal reflux disease)    no current med.   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   Vitamin D deficiency     Past Surgical History:  Procedure Laterality Date   ABDOMINAL HYSTERECTOMY     partial   APPENDECTOMY      BREAST BIOPSY Left    BREAST CYST EXCISION Left    BREAST EXCISIONAL BIOPSY Left 03/2015   BREAST LUMPECTOMY Right 08/2018   BREAST LUMPECTOMY WITH RADIOACTIVE SEED LOCALIZATION Left 04/07/2015   Procedure: BREAST LUMPECTOMY WITH RADIOACTIVE SEED LOCALIZATION;  Surgeon: Erroll Luna, MD;  Location: [place];  Service: General;  Laterality: Left;   BREAST LUMPECTOMY WITH RADIOACTIVE SEED LOCALIZATION Right 08/13/2018   Procedure: RIGHT BREAST LUMPECTOMY WITH RADIOACTIVE SEED LOCALIZATION;  Surgeon: Rolm Bookbinder, MD;  Location: [place];  Service: General;  Laterality: Right;   CORONARY STENT INTERVENTION N/A 07/15/2016   Procedure: Coronary Stent Intervention;  Surgeon: Sherren Mocha, MD;  Location: [place] [facility];  Service: Cardiovascular;  Laterality: N/A;   ESOPHAGOGASTRODUODENOSCOPY (EGD) WITH PROPOFOL Left 07/25/2016   Procedure: ESOPHAGOGASTRODUODENOSCOPY (EGD) WITH PROPOFOL;  Surgeon: Ronnette Juniper, MD;  Location: [place];  Service: Gastroenterology;  Laterality: Left;   KNEE ARTHROSCOPY Right 12/07/2003   LAPAROSCOPIC SALPINGO OOPHERECTOMY Right 09/08/2003   LEFT HEART CATH N/A 07/15/2016   Procedure: Left Heart Cath;  Surgeon: Sherren Mocha, MD;  Location: [place] [facility];  Service: Cardiovascular;  Laterality: N/A;   LEFT HEART CATH AND CORONARY ANGIOGRAPHY N/A 04/14/2019   Procedure: LEFT HEART CATH AND CORONARY ANGIOGRAPHY;  Surgeon: Burnell Blanks, MD;  Location: [place] [facility];  Service: Cardiovascular;  Laterality: N/A;   LYSIS OF ADHESION  09/08/2003   NASAL SINUS SURGERY  age 20   RECTAL POLYPECTOMY  10/02/2006    MEDICATIONS: No current facility-administered medications for this encounter.    albuterol (PROVENTIL) (2.5 MG/3ML) 0.083% nebulizer solution   albuterol (VENTOLIN HFA) 108 (90 Base) MCG/ACT inhaler   calcium carbonate (TUMS - DOSED IN MG ELEMENTAL CALCIUM) 500 MG chewable tablet   Cholecalciferol 25  MCG (1000 UT) capsule   Cyanocobalamin (B-12) 5000 MCG CAPS   levothyroxine (SYNTHROID) 125 MCG tablet   nitroGLYCERIN (NITROSTAT) 0.4 MG SL tablet   predniSONE (DELTASONE) 20 MG tablet   SYMBICORT 160-4.5 MCG/ACT inhaler   cetirizine (ZYRTEC) 10 MG tablet   montelukast (SINGULAIR) 10 MG tablet    Willeen Cass, PhD, FNP-BC [facility]/Anesthesiology Phone: [phone] 08/28/2021 4:44 PM

## 2021-08-28 NOTE — Progress Notes (Signed)
PCP - Dr Jonathon Jordan Cardiologist - Dr [name] Pulmonology - Tammy Parrett, NP/ Dr Melvyn Novas  Chest x-ray - 08/14/21 (1V) EKG - 08/15/21 Stress Test - 11/02/10  ECHO - 07/16/16 Cardiac Cath - 04/14/19  ICD Pacemaker/Loop - n/a  Sleep Study -  n/a CPAP - none  Anesthesia review: Yes  STOP now taking any Aspirin (unless otherwise instructed by your surgeon), Aleve, Naproxen, Ibuprofen, Motrin, Advil, Goody's, BC's, all herbal medications, fish oil, and all vitamins.   Coronavirus Screening Do you have any of the following symptoms:  Cough yes/no: No Fever (>100.24F)  yes/no: No Runny nose yes/no: No Sore throat yes/no: No Difficulty breathing/shortness of breath  yes/no: No  Have you traveled in the last 14 days and where? yes/no: No  Patient verbalized understanding of instructions that were given via phone.

## 2021-08-29 DIAGNOSIS — M353 Polymyalgia rheumatica: Secondary | ICD-10-CM | POA: Diagnosis not present

## 2021-08-30 ENCOUNTER — Ambulatory Visit (HOSPITAL_COMMUNITY): Admission: RE | Admit: 2021-08-30 | Payer: Medicare Other | Source: Home / Self Care | Admitting: Surgery

## 2021-08-30 HISTORY — DX: Acute myocardial infarction, unspecified: I21.9

## 2021-08-30 HISTORY — DX: Fibromyalgia: M79.7

## 2021-08-30 HISTORY — DX: Dyspnea, unspecified: R06.00

## 2021-08-30 HISTORY — DX: Malignant neoplasm of unspecified site of unspecified female breast: C50.919

## 2021-08-30 SURGERY — BIOPSY TEMPORAL ARTERY
Anesthesia: Choice | Laterality: Right

## 2021-08-31 DIAGNOSIS — H2513 Age-related nuclear cataract, bilateral: Secondary | ICD-10-CM | POA: Diagnosis not present

## 2021-08-31 DIAGNOSIS — R519 Headache, unspecified: Secondary | ICD-10-CM | POA: Diagnosis not present

## 2021-08-31 NOTE — Progress Notes (Deleted)
Cardiology Office Note    Date:  08/31/2021   ID:  Sonia [name], Sonia [name] 1941/01/03, MRN [id number]   PCP:  Glenis Smoker, MD   [place]  Cardiologist:  [name], MD   Advanced Practice Provider:  No care team member to display Electrophysiologist:  None   [phone]   No chief complaint on file.   History of Present Illness:  Sonia [name] is a 81 y.o. female with history of CAD, COPD, GERD and hypothyroidism   She had anNSTEMI Cardiac cath May 2018 with severe stenosis in the mid RCA treated with a drug eluting stent. There was a moderate stenosis in the mid LAD. Echo May 2018 with normal LV systolic function, mild valve disease. She was discharged on Brilinta and readmitted one week later with a GI bleed. EGD showed gastritis. She was changed from Brilinta to Plavix and bleeding resolved. Chest pain and dyspnea at times felt to be due to her asthma as it improved with her inhalers. She has not tolerate statins. She cancelled a stress test arranged in March 2019. She was found to have breast cancer and has undergone lumpectomy in 2020. She had left arm pain in early 2021. Cardiac cath 04/14/19 with non-obstructive disease in the LAD and Circumflex and patent stent in the RCA with minimal restenosis. LV systolic function was normal.   Patient last saw Dr. Angelena Form 2021 via telemedicine and doing well.   She was scheduled for temporal artery biopsy and was canceled because she told preop she was having chest pain and DOE she thought was due to her asthma.  Past Medical History:  Diagnosis Date   Arthritis    Asthma    daily and prn inhalers; states good control currently   Breast cancer ([place])    right   CAD S/P percutaneous coronary angioplasty 07/15/2016   COPD (chronic obstructive pulmonary disease) (HCC)    Dyspnea    occ with exertion   Fibromyalgia    GERD (gastroesophageal reflux disease)    no current  med.   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   Vitamin D deficiency     Past Surgical History:  Procedure Laterality Date   ABDOMINAL HYSTERECTOMY     partial   APPENDECTOMY     BREAST BIOPSY Left    BREAST CYST EXCISION Left    BREAST EXCISIONAL BIOPSY Left 03/2015   BREAST LUMPECTOMY Right 08/2018   BREAST LUMPECTOMY WITH RADIOACTIVE SEED LOCALIZATION Left 04/07/2015   Procedure: BREAST LUMPECTOMY WITH RADIOACTIVE SEED LOCALIZATION;  Surgeon: Erroll Luna, MD;  Location: [place];  Service: General;  Laterality: Left;   BREAST LUMPECTOMY WITH RADIOACTIVE SEED LOCALIZATION Right 08/13/2018   Procedure: RIGHT BREAST LUMPECTOMY WITH RADIOACTIVE SEED LOCALIZATION;  Surgeon: Rolm Bookbinder, MD;  Location: [place];  Service: General;  Laterality: Right;   COLONOSCOPY     CORONARY STENT INTERVENTION N/A 07/15/2016   Procedure: Coronary Stent Intervention;  Surgeon: Sherren Mocha, MD;  Location: [place] [facility];  Service: Cardiovascular;  Laterality: N/A;  ESOPHAGOGASTRODUODENOSCOPY (EGD) WITH PROPOFOL Left 07/25/2016   Procedure: ESOPHAGOGASTRODUODENOSCOPY (EGD) WITH PROPOFOL;  Surgeon: Ronnette Juniper, MD;  Location: [place];  Service: Gastroenterology;  Laterality: Left;   KNEE ARTHROSCOPY Right 12/07/2003   LAPAROSCOPIC SALPINGO OOPHERECTOMY Right 09/08/2003   LEFT HEART CATH N/A 07/15/2016   Procedure: Left Heart Cath;  Surgeon: Sherren Mocha, MD;  Location: [place] [facility];  Service: Cardiovascular;  Laterality: N/A;   LEFT HEART CATH AND CORONARY ANGIOGRAPHY N/A 04/14/2019   Procedure: LEFT HEART  CATH AND CORONARY ANGIOGRAPHY;  Surgeon: Burnell Blanks, MD;  Location: [place] [facility];  Service: Cardiovascular;  Laterality: N/A;   LYSIS OF ADHESION  09/08/2003   NASAL SINUS SURGERY  age 24   RECTAL POLYPECTOMY  10/02/2006    Current Medications: No outpatient medications have been marked as taking for the 09/05/21 encounter (Appointment) with Imogene Burn, PA-C.     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  Tobacco Use   Smoking status: Never   Smokeless tobacco: Never  Scientific laboratory technician Use: Never used  Substance and Sexual Activity   Alcohol use: Not Currently    Comment: rarely   Drug use: No   Sexual activity: Not Currently    Birth control/protection: Surgical    Comment: Hysterectomy  Other Topics Concern   Not on file  Social History Narrative   Not on file   Social Determinants of Health   Financial Resource Strain: Not on file  Food Insecurity: Not on file  Transportation Needs: Not on file  Physical Activity: Not on file  Stress: Not on file  Social Connections: Not on file     Family History:  The patient's ***family history includes Alzheimer's disease in her maternal aunt; Alzheimer's disease (age of onset: 75) in her brother; Asthma in her mother, sister, and sister; Breast cancer in an other family member; Breast cancer (age of onset: 26) in an other family member; Breast cancer (age of onset: 84) in her sister; Breast cancer (age of onset: 62) in her maternal aunt; Breast cancer (age  of onset: 36) in her cousin; Cancer (age of onset: 61) in an other family member; Cervical cancer in her cousin; Coronary artery disease in an other family member; Heart attack (age of onset: 36) in her brother; Heart attack (age of onset: 3) in her son; Heart disease in her cousin, father, mother, and sister; Kidney disease in her father; Leukemia (age of onset: 76) in her cousin; Lung cancer (age of onset: 28) in her cousin; Melanoma in an other family member; Memory loss in her sister; Prostate cancer in her brother; Spinal muscular atrophy in her grandchild; Stomach cancer (age of onset: 63) in her maternal grandfather.   ROS:   Please see the history of present illness.    ROS All other systems reviewed and are negative.   PHYSICAL EXAM:   VS:  There were no vitals taken for this visit.  Physical Exam  GEN: Well nourished, well developed, in no acute distress  HEENT:  normal  Neck: no JVD, carotid bruits, or masses Cardiac:RRR; no murmurs, rubs, or gallops  Respiratory:  clear to auscultation bilaterally, normal work of breathing GI: soft, nontender, nondistended, + BS Ext: without cyanosis, clubbing, or edema, Good distal pulses bilaterally MS: no deformity or atrophy  Skin: warm and dry, no rash Neuro:  Alert and Oriented x 3, Strength and sensation are intact Psych: euthymic mood, full affect  Wt Readings from Last 3 Encounters:  08/21/21 143 lb (64.9 kg)  06/14/21 144 lb (65.3 kg)  06/02/21 143 lb (64.9 kg)      Studies/Labs Reviewed:   EKG:  EKG is*** ordered today.  The ekg ordered today demonstrates ***  Recent Labs: 06/02/2021: B Natriuretic Peptide 32.9 08/14/2021: ALT 12; BUN 22; Creatinine, Ser 1.02; Hemoglobin 14.7; Platelets 196; Potassium 4.1; Sodium 142   Lipid Panel    Component Value Date/Time   CHOL 174 07/26/2017 1001   TRIG 65 07/26/2017 1001   HDL 76 07/26/2017 1001   CHOLHDL 2.5 01/16/2017 1036   CHOLHDL 3.0 07/13/2016 2331   VLDL 12 07/13/2016  2331   LDLCALC 85 07/26/2017 1001    Additional studies/ records that were reviewed today include:  Conclusion   1. Severe single-vessel coronary artery disease with severe stenosis of the RCA treated 6 escalate with PCI using a drug-eluting stent (3.0 x 28 mm Promus DES postdilated to 3.5 mm)   2. Nonobstructive LAD stenosis   3. Normal LV systolic function with mild mitral regurgitation   Recommend: DAPT with ASA and brilinta without interruption x 12 months If no complications arise OK for discharge tomorrow    Echo 07/16/16: - Left ventricle: The cavity size was normal. Systolic function was   normal. The estimated ejection fraction was in the range of 60%   to 65%. Wall motion was normal; there were no regional wall   motion abnormalities. Features are consistent with a pseudonormal   left ventricular filling pattern, with concomitant abnormal   relaxation and increased filling pressure (grade 2 diastolic   dysfunction). Doppler parameters are consistent with high   ventricular filling pressure. - Mitral valve: Calcified annulus. There was mild regurgitation. - Tricuspid valve: There was mild regurgitation. - Pulmonic valve: There was trivial regurgitation. - Pulmonary arteries: PA peak pressure: 37 mm Hg (S).  Impressions:  - The right ventricular systolic pressure was increased consistent   with mild pulmonary hypertension.     Risk Assessment/Calculations:   {Does this patient have ATRIAL FIBRILLATION?:[id number]}     ASSESSMENT:    No diagnosis found.   PLAN:  In order of problems listed above:  CAD NSTEMI 07/2016 DES RCA with mod mLAD, cath 04/2019 non-obstructive disease in LAD and Cfx, patent stent  GI bleed-brilinta changed to plavix  HLD on crestor 3-4 days/week  COPD with asthma.  Shared Decision Making/Informed Consent   {Are you ordering a CV Procedure (e.g. stress test, cath, DCCV, TEE, etc)?   Press F2        :[id number]}    Medication  Adjustments/Labs and Tests Ordered: Current medicines are reviewed at length with the patient today.  Concerns regarding medicines are outlined above.  Medication changes, Labs and Tests ordered today are listed in the Patient Instructions below. There are no Patient Instructions on file for this visit.   Sumner Boast, PA-C  08/31/2021 8:41 AM    [facility] [address], [place], [place]  [address] Phone: [phone]; Fax: [phone]

## 2021-09-01 ENCOUNTER — Encounter (HOSPITAL_BASED_OUTPATIENT_CLINIC_OR_DEPARTMENT_OTHER): Payer: Self-pay | Admitting: Physical Therapy

## 2021-09-01 ENCOUNTER — Other Ambulatory Visit: Payer: Self-pay | Admitting: Family Medicine

## 2021-09-01 ENCOUNTER — Ambulatory Visit (HOSPITAL_BASED_OUTPATIENT_CLINIC_OR_DEPARTMENT_OTHER): Payer: Medicare Other | Admitting: Physical Therapy

## 2021-09-01 DIAGNOSIS — R262 Difficulty in walking, not elsewhere classified: Secondary | ICD-10-CM | POA: Diagnosis not present

## 2021-09-01 DIAGNOSIS — R293 Abnormal posture: Secondary | ICD-10-CM | POA: Diagnosis not present

## 2021-09-01 DIAGNOSIS — E2839 Other primary ovarian failure: Secondary | ICD-10-CM

## 2021-09-01 NOTE — Telephone Encounter (Signed)
Received a call from patient, stating she has decided not to have surgery with no reasoning given. She has canceled the cardiac clearance evaluation appointment as well.

## 2021-09-04 ENCOUNTER — Ambulatory Visit (HOSPITAL_BASED_OUTPATIENT_CLINIC_OR_DEPARTMENT_OTHER): Payer: Medicare Other | Admitting: Physical Therapy

## 2021-09-04 VITALS — HR 105

## 2021-09-04 DIAGNOSIS — R262 Difficulty in walking, not elsewhere classified: Secondary | ICD-10-CM | POA: Diagnosis not present

## 2021-09-04 DIAGNOSIS — R293 Abnormal posture: Secondary | ICD-10-CM | POA: Diagnosis not present

## 2021-09-04 NOTE — Therapy (Signed)
OUTPATIENT PHYSICAL THERAPY TREATMENT   PT End of Session - 09/04/21 0944     Visit Number 19    Number of Visits 25    Date for PT Re-Evaluation 09/05/21    Authorization Type MCR    Progress Note Due on Visit 20    PT Start Time 0930    PT Stop Time 1010    PT Time Calculation (min) 40 min    Activity Tolerance Patient tolerated treatment well    Behavior During Therapy [facility] for tasks assessed/performed              Patient Name: Sonia [name] MRN: [id number] DOB:29-Jun-1940, 81 y.o., female Today's Date: 07/25/21   Past Medical History:  Diagnosis Date   Arthritis    Asthma    daily and prn inhalers; states good control currently   Breast cancer (HCC)    right   CAD S/P percutaneous coronary angioplasty 07/15/2016   COPD (chronic obstructive pulmonary disease) (HCC)    Dyspnea    occ with exertion   Fibromyalgia    GERD (gastroesophageal reflux disease)    no current med.   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   Vitamin D deficiency    Past Surgical History:  Procedure Laterality Date   ABDOMINAL HYSTERECTOMY     partial   APPENDECTOMY     BREAST BIOPSY Left    BREAST CYST EXCISION Left    BREAST EXCISIONAL BIOPSY Left 03/2015   BREAST LUMPECTOMY Right 08/2018   BREAST LUMPECTOMY WITH RADIOACTIVE SEED LOCALIZATION Left 04/07/2015   Procedure: BREAST LUMPECTOMY WITH RADIOACTIVE SEED LOCALIZATION;  Surgeon: Harriette Bouillon, MD;  Location: [facility];  Service: General;  Laterality: Left;   BREAST LUMPECTOMY WITH RADIOACTIVE SEED LOCALIZATION Right 08/13/2018   Procedure: RIGHT BREAST LUMPECTOMY WITH  RADIOACTIVE SEED LOCALIZATION;  Surgeon: Emelia Loron, MD;  Location: [facility];  Service: General;  Laterality: Right;   COLONOSCOPY     CORONARY STENT INTERVENTION N/A 07/15/2016   Procedure: Coronary Stent Intervention;  Surgeon: Tonny Bollman, MD;  Location: [facility] INVASIVE CV LAB;  Service: Cardiovascular;  Laterality: N/A;   ESOPHAGOGASTRODUODENOSCOPY (EGD) WITH PROPOFOL Left 07/25/2016   Procedure: ESOPHAGOGASTRODUODENOSCOPY (EGD) WITH PROPOFOL;  Surgeon: Kerin Salen, MD;  Location: [facility] ENDOSCOPY;  Service: Gastroenterology;  Laterality: Left;   KNEE ARTHROSCOPY Right 12/07/2003   LAPAROSCOPIC SALPINGO OOPHERECTOMY Right 09/08/2003   LEFT HEART CATH N/A 07/15/2016   Procedure: Left Heart Cath;  Surgeon: Tonny Bollman, MD;  Location: [facility] INVASIVE CV LAB;  Service: Cardiovascular;  Laterality: N/A;   LEFT HEART CATH AND CORONARY ANGIOGRAPHY N/A 04/14/2019   Procedure: LEFT HEART CATH AND CORONARY ANGIOGRAPHY;  Surgeon: Kathleene Hazel, MD;  Location: [facility] INVASIVE CV LAB;  Service: Cardiovascular;  Laterality: N/A;   LYSIS OF ADHESION  09/08/2003   NASAL SINUS SURGERY  age 63   RECTAL POLYPECTOMY  10/02/2006   Patient Active Problem List   Diagnosis Date Noted   Coronary artery disease of native artery of native heart with stable angina pectoris (HCC)    Asthma, chronic,  moderate persistent, uncomplicated 02/03/2019   DOE (dyspnea on exertion) 07/31/2018   Osteopenia 06/04/2018   Carcinoma of upper-outer quadrant of right breast in female, estrogen receptor positive (HCC) 05/30/2018   GI bleed 07/25/2016   Melena 07/24/2016   Acute renal insufficiency 07/24/2016   CAD S/P percutaneous coronary angioplasty 07/16/2016   NSTEMI (non-ST elevated myocardial infarction) (HCC) 07/13/2016   Severe asthma with acute exacerbation 03/07/2016   Genetic testing 06/09/2015   Family history of breast cancer in female 05/10/2015   History of melanoma 05/10/2015    Atypical lobular hyperplasia of left breast 04/26/2015   Vision problem 01/08/2014   Hiatus hernia syndrome 08/01/2012   Palpitations 10/18/2010   Acute chest pain 10/18/2010   Hypothyroidism 10/18/2010   Dyslipidemia 06/07/2009   Upper airway cough syndrome 03/29/2009   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   GERD 12/05/2006    PCP: Shon Hale, MD  REFERRING PROVIDER: Shon Hale, MD   REFERRING DIAG: R26.89 (ICD-10-CM) - Other abnormalities of gait and mobility   THERAPY DIAG:  No diagnosis found.  ONSET DATE: about 2 years ago- when COVID shut down began  SUBJECTIVE:   SUBJECTIVE STATEMENT:  Pt reports she felt very good after last session.  The stretches helped.   PERTINENT HISTORY: asthma PAIN:  Are you having pain? no Location:  Pain level: 0/10   LIVING ENVIRONMENT: Lives with: lives with their family, dog Lives in: House/apartment Stairs: No;    OCCUPATION: retired Engineer, civil (consulting)  PLOF: Independent  PATIENT GOALS: Improve endurance and would like to be able to walk more, be able to do more daily functions/chores without being tired   OBJECTIVE:  FOTO   44 at eval 52 at D/C 6 pts MCII  6th Visit FOTO 3/6: 42pts 10th visit FOTO 4/4: 49 pts   FUNCTIONAL TESTS:  Previous: 5 times sit to stand: 13s 6 minute walk test: 1050ft  no breaks needed (97% O2 saturation)    4/4 Progress Note  LE MMT: 4+/5 throughout bilat hips and knees  5 times sit to stand: 9s without UE, no pain   6 minute walk test: 1230ft no breaks needed (97% O2 saturation)    TODAY'S TREATMENT:  Pt seen for aquatic therapy today.  Treatment took place in water 3.25-4.25 ft in depth at the [facility] pool. Temp of water was 92.  Pt entered/exited the pool via stairs independently with bilat rail.  Walking forward/ backward without UE support Squats and heel raises holding wall Hip abdct/ add x 20 each holding wall High knee marching forward/ backward  with rainbow -> yellow hand buoys under water at sides Side stepping with squats Ai Chi: floating, uplifting, enclosing, soothing, freeing (in staggered stance and wide squat) Holding yellow noodle: curtsy squats LE Stretches with back against wall, LE supported by thin sqoodle:adductor, hamstring, ITB stretch Holding rails: L stretch, fig 4, side stretch    Pt requires buoyancy for support and to offload joints with strengthening exercises. Viscosity of the water is needed for resistance of strengthening; water current perturbations provides challenge to standing balance unsupported, requiring increased core activation.    PATIENT EDUCATION:  Education details:  exam findings, deficits, goal planning, exercise progression, aquatic therapy,  expectations, muscle firing,  envelope of function, HEP, POC Person educated: Patient Education method: Explanation, Demonstration, Tactile cues, Verbal cues, and Handouts Education comprehension: verbalized understanding, returned demonstration, verbal cues required, tactile cues required, and needs further education   HOME EXERCISE PROGRAM: [id number]  ASSESSMENT:  CLINICAL IMPRESSION: Pt required minor cues throughout for more upright posture and to slow speed of exercise. Pt had good tolerance to last session; repeated similar exercises today with similar speed. Encouraged restorative postures (ie: hooklying on towel roll with arms abdct to 90 to stretch chest).   Will continue to monitor and progress accordingly.     Objective impairments include decreased activity tolerance, decreased endurance, difficulty walking, decreased strength, increased muscle spasms, impaired flexibility, and postural dysfunction. These impairments are limiting patient from cleaning, community activity, and lives independently- has difficulty with endurance to complete ADLs . Personal factors including Fitness, Time since onset of injury/illness/exacerbation, and 3+  comorbidities: h/o breast CA, pneumonia, asthma  are also affecting patient's functional outcome. Patient will benefit from skilled PT to address above impairments and improve overall function. We stood and talked in the aquatics facility to ensure that pt would tolerate temp and humidity and she verbalized readiness for aquatic treatments.   REHAB POTENTIAL: Good  CLINICAL DECISION MAKING: Unstable/unpredictable  EVALUATION COMPLEXITY: High   GOALS:   SHORT TERM GOALS:  STG Name Target Date Goal status  1 Pt will become independent with HEP in order to demonstrate synthesis of PT education.  06/06/2021  MET   LONG TERM GOALS:   LTG Name Target Date Goal status  1 Pt  will become independent with final HEP in order to demonstrate synthesis of PT education.   07/18/2021  ongoing  2 Pt will be able to perform 5XSTS in under 12s  in order to demonstrate functional improvement above the cut off score for adults.  07/18/2021 MET  3 Pt will be able to walk at least 3280 ft or 1045m during in order to demonstrate improvement in functional endurance and mobility for decrease in all cause mortality.   07/18/2021 Met  4 Pt will be able to demonstrate/report ability to walk >15 mins without pain in order to demonstrate functional improvement and tolerance to exercise and community mobility.  07/18/2021 MET  5 Pt will improve on from 1266ft  to > 1550 ft to demonstrate continued improvement in toleration to exercise and community mobility. 09/05/21 New  6 Pt will understand Borg scale and be indep with monitoring exertional level. 09/05/21 New   PLAN: PT FREQUENCY: 1-2x/week  PT DURATION: 6  PLANNED INTERVENTIONS: Therapeutic exercises, Therapeutic activity, Neuro Muscular re-education, Balance training, Gait training, Patient/Family education, Stair training, Aquatic Therapy, Dry Needling, Spinal mobilization, Cryotherapy, Moist heat, Taping, Traction, and Manual therapy  PLAN FOR NEXT  SESSION: continue aquatics; continue with general conditioning, endurance; UE and LE strength circuits alternate. Assess goals.  POC ends 09/05/21  Mayer Camel, PTA 09/04/21 10:11 AM

## 2021-09-05 ENCOUNTER — Ambulatory Visit: Payer: Medicare Other | Admitting: Physician Assistant

## 2021-09-06 ENCOUNTER — Ambulatory Visit (HOSPITAL_BASED_OUTPATIENT_CLINIC_OR_DEPARTMENT_OTHER): Payer: Medicare Other | Admitting: Physical Therapy

## 2021-09-07 ENCOUNTER — Ambulatory Visit (HOSPITAL_BASED_OUTPATIENT_CLINIC_OR_DEPARTMENT_OTHER): Payer: Medicare Other | Admitting: Physical Therapy

## 2021-09-08 ENCOUNTER — Ambulatory Visit (HOSPITAL_BASED_OUTPATIENT_CLINIC_OR_DEPARTMENT_OTHER): Payer: Medicare Other | Admitting: Physical Therapy

## 2021-09-10 DIAGNOSIS — J45909 Unspecified asthma, uncomplicated: Secondary | ICD-10-CM | POA: Diagnosis not present

## 2021-09-15 ENCOUNTER — Ambulatory Visit (HOSPITAL_BASED_OUTPATIENT_CLINIC_OR_DEPARTMENT_OTHER): Payer: Medicare Other | Attending: Family Medicine | Admitting: Physical Therapy

## 2021-09-15 ENCOUNTER — Encounter (HOSPITAL_BASED_OUTPATIENT_CLINIC_OR_DEPARTMENT_OTHER): Payer: Self-pay | Admitting: Physical Therapy

## 2021-09-15 DIAGNOSIS — R293 Abnormal posture: Secondary | ICD-10-CM | POA: Insufficient documentation

## 2021-09-15 DIAGNOSIS — R262 Difficulty in walking, not elsewhere classified: Secondary | ICD-10-CM | POA: Diagnosis not present

## 2021-09-15 NOTE — Therapy (Addendum)
PHYSICAL THERAPY DISCHARGE SUMMARY  Visits from Start of Care: 20  Current functional level related to goals / functional outcomes: Pt indep with ADL's and functional mobility using AD's as needed   Remaining deficits: Chronic asthma   Education / Equipment: Management of condition; transitional program; HEP   Patient agrees to discharge. Patient goals were met. Patient is being discharged due to meeting the stated rehab goals.  OUTPATIENT PHYSICAL THERAPY TREATMENT   PT End of Session - 09/15/21 1049     Visit Number 20    Number of Visits 25    Date for PT Re-Evaluation 09/15/21    Authorization Type MCR    Progress Note Due on Visit 20    PT Start Time 1115    PT Stop Time 1200    PT Time Calculation (min) 45 min    Activity Tolerance Patient tolerated treatment well    Behavior During Therapy [facility] for tasks assessed/performed              Patient Name: Jamiyla Ishee MRN: [id number] DOB:March 16, 1940, 81 y.o., female Today's Date: 07/25/21   Past Medical History:  Diagnosis Date   Arthritis    Asthma    daily and prn inhalers; states good control currently   Breast cancer ([place])    right   CAD S/P percutaneous coronary angioplasty 07/15/2016   COPD (chronic obstructive pulmonary disease) (HCC)    Dyspnea    occ with exertion   Fibromyalgia    GERD (gastroesophageal reflux disease)    no current med.   GI bleed 07/25/2016   Brilinta changed to Plavix   Hashimoto's thyroiditis    Headache    allergies; silent migraines   Hiatal hernia    Hypothyroidism    Malignant melanoma ([place]) 08/20/2019   Left Posterior Neck (in situ) exc   Melanoma in situ ([place]) 07/31/2021   Neck Posterior   Myocardial infarction ([place])    Pneumonia    Sclerosing adenosis of left breast 03/2015   Vitamin D deficiency    Past Surgical History:  Procedure Laterality Date   ABDOMINAL HYSTERECTOMY     partial   APPENDECTOMY     BREAST BIOPSY Left    BREAST CYST  EXCISION Left    BREAST EXCISIONAL BIOPSY Left 03/2015   BREAST LUMPECTOMY Right 08/2018   BREAST LUMPECTOMY WITH RADIOACTIVE SEED LOCALIZATION Left 04/07/2015   Procedure: BREAST LUMPECTOMY WITH RADIOACTIVE SEED LOCALIZATION;  Surgeon: Erroll Luna, MD;  Location: [place];  Service: General;  Laterality: Left;   BREAST LUMPECTOMY WITH RADIOACTIVE SEED LOCALIZATION Right 08/13/2018   Procedure: RIGHT BREAST LUMPECTOMY WITH RADIOACTIVE SEED LOCALIZATION;  Surgeon: Rolm Bookbinder, MD;  Location: [place];  Service: General;  Laterality: Right;   COLONOSCOPY     CORONARY STENT INTERVENTION N/A 07/15/2016   Procedure: Coronary Stent Intervention;  Surgeon: Sherren Mocha, MD;  Location: [place] CV LAB;  Service: Cardiovascular;  Laterality: N/A;   ESOPHAGOGASTRODUODENOSCOPY (EGD) WITH PROPOFOL Left 07/25/2016   Procedure: ESOPHAGOGASTRODUODENOSCOPY (EGD) WITH PROPOFOL;  Surgeon: Ronnette Juniper, MD;  Location: [place];  Service: Gastroenterology;  Laterality: Left;   KNEE ARTHROSCOPY Right 12/07/2003   LAPAROSCOPIC SALPINGO OOPHERECTOMY Right 09/08/2003   LEFT HEART CATH N/A 07/15/2016   Procedure: Left Heart Cath;  Surgeon: Sherren Mocha, MD;  Location: [place] CV LAB;  Service: Cardiovascular;  Laterality: N/A;   LEFT HEART CATH AND CORONARY ANGIOGRAPHY N/A 04/14/2019   Procedure: LEFT HEART CATH AND CORONARY ANGIOGRAPHY;  Surgeon: Burnell Blanks, MD;  Location: [place] CV LAB;  Service: Cardiovascular;  Laterality: N/A;   LYSIS OF ADHESION  09/08/2003   NASAL SINUS SURGERY  age 81   RECTAL POLYPECTOMY  10/02/2006   Patient Active Problem List   Diagnosis Date Noted   Coronary artery disease of native artery of native heart with stable angina pectoris ([employer])    Asthma, chronic, moderate persistent, uncomplicated 28/78/6767   DOE (dyspnea on exertion) 07/31/2018   Osteopenia 06/04/2018   Carcinoma of upper-outer quadrant of  right breast in female, estrogen receptor positive ([place]) 05/30/2018   GI bleed 07/25/2016   Melena 07/24/2016   Acute renal insufficiency 07/24/2016   CAD S/P percutaneous coronary angioplasty 07/16/2016   NSTEMI (non-ST elevated myocardial infarction) ([place]) 07/13/2016   Severe asthma with acute exacerbation 03/07/2016   Genetic testing 06/09/2015   Family history of breast cancer in female 05/10/2015   History of melanoma 05/10/2015   Atypical lobular hyperplasia of left breast 04/26/2015   Vision problem 01/08/2014   Hiatus hernia syndrome 08/01/2012   Palpitations 10/18/2010   Acute chest pain 10/18/2010   Hypothyroidism 10/18/2010   Dyslipidemia 06/07/2009   Upper airway cough syndrome 03/29/2009   COPD with asthma ([place]) 12/05/2006   GERD 12/05/2006    PCP: Glenis Smoker, MD  REFERRING PROVIDER: Glenis Smoker, MD   REFERRING DIAG: R26.89 (ICD-10-CM) - Other abnormalities of gait and mobility   THERAPY DIAG:  Difficulty in walking, not elsewhere classified - Plan: PT plan of care cert/re-cert  Abnormal posture - Plan: PT plan of care cert/re-cert  ONSET DATE: about 2 years ago- when COVID shut down began  SUBJECTIVE:   SUBJECTIVE STATEMENT:  "My balance is not great"  PERTINENT HISTORY: asthma PAIN:  Are you having pain? no Location:  Pain level: 0/10   LIVING ENVIRONMENT: Lives with: lives with their family, dog Lives in: House/apartment Stairs: No;    OCCUPATION: retired Marine scientist  PLOF: Independent  PATIENT GOALS: Improve endurance and would like to be able to walk more, be able to do more daily functions/chores without being tired   OBJECTIVE:  FOTO   44 at eval 52 at D/C 6 pts MCII  6th Visit FOTO 3/6: 42pts 10th visit FOTO 4/4: 49 pts DC visit 7/7 50 pts  FUNCTIONAL TESTS:  Previous: 5 times sit to stand: 13s 6 minute walk test: 101f  no breaks needed (97% O2 saturation)      4/4 Progress Note  LE MMT: 4+/5  throughout bilat hips and knees  5 times sit to stand: 9s without UE, no pain   6 minute walk test: 122103fno breaks needed (97% O2 saturation)   09/15/21 Progress note  Berg 09/15/21 46/56  low fall risk  6MWT [id number]'S TREATMENT:  Pt seen for aquatic therapy today.  Treatment took place in water 3.25-4.25 ft in depth at the [facility]. Temp of water was 92.  Pt entered/exited the pool via stairs independently with bilat rail.  Walking forward/ backward without UE support Squats, hip extension;add/abd and heel raises holding wall x15. High knee marching forward/ backward with yellow hand buoys under water at sides x6 widths Side stepping with squats rainbow hand buoys 4 widths Holding yellow noodle: curtsy squats LE Stretches with back against wall, LE supported by thin sqoodle:adductor, hamstring, ITB stretch Holding rails: hamstring and gastroc stretch; fig 4 stretch    Pt requires buoyancy for support and to offload joints with  strengthening exercises. Viscosity of the water is needed for resistance of strengthening; water current perturbations provides challenge to standing balance unsupported, requiring increased core activation.  Berg completed. DC instructions given  PATIENT EDUCATION:  Education details:  exam findings, deficits, goal planning, exercise progression, aquatic therapy,  expectations, muscle firing,  envelope of function, HEP, POC Person educated: Patient Education method: Explanation, Demonstration, Tactile cues, Verbal cues, and Handouts Education comprehension: verbalized understanding, returned demonstration, verbal cues required, tactile cues required, and needs further education   HOME EXERCISE PROGRAM: [id number]   ASSESSMENT:  CLINICAL IMPRESSION: Pt has met all of her goals.  She reports some recent balance issues.  Tested Berg Scored [phone] indicating low fall risk.  Reviewed Borg exertion scale.  Pt VU to work comfortably _0   on scale to avoid over exertion. Aquatic HEP instructed and laminated. She is indep with. She will continue going forward with her aquatic HEP participating in aquatic transition program   Objective impairments include decreased activity tolerance, decreased endurance, difficulty walking, decreased strength, increased muscle spasms, impaired flexibility, and postural dysfunction. These impairments are limiting patient from cleaning, community activity, and lives independently- has difficulty with endurance to complete ADLs . Personal factors including Fitness, Time since onset of injury/illness/exacerbation, and 3+ comorbidities: h/o breast CA, pneumonia, asthma  are also affecting patient's functional outcome. Patient will benefit from skilled PT to address above impairments and improve overall function. We stood and talked in the aquatics facility to ensure that pt would tolerate temp and humidity and she verbalized readiness for aquatic treatments.   REHAB POTENTIAL: Good  CLINICAL DECISION MAKING: Unstable/unpredictable  EVALUATION COMPLEXITY: High   GOALS:   SHORT TERM GOALS:  STG Name Target Date Goal status  1 Pt will become independent with HEP in order to demonstrate synthesis of PT education.  06/06/2021  MET   LONG TERM GOALS:   LTG Name Target Date Goal status  1 Pt  will become independent with final HEP in order to demonstrate synthesis of PT education.   07/18/2021  Achieved  2 Pt will be able to perform 5XSTS in under 12s  in order to demonstrate functional improvement above the cut off score for adults.  07/18/2021 MET  3 Pt will be able to walk at least 3280 ft or 1041mduring 6MWT in order to demonstrate improvement in functional endurance and mobility for decrease in all cause mortality.   07/18/2021 Met  4 Pt will be able to demonstrate/report ability to walk >15 mins without pain in order to demonstrate functional improvement and tolerance to exercise and community  mobility.  07/18/2021 MET  5 Pt will improve on 6MWT from 12226f to > 1550 ft to demonstrate continued improvement in toleration to exercise and community mobility. 09/05/21 MET  6 Pt will understand Borg scale and be indep with monitoring exertional level. 09/05/21 MET   PLAN: PT FREQUENCY: 1day  PT DURATION: 1  PLANNED INTERVENTIONS: Therapeutic exercises, Therapeutic activity, Neuro Muscular re-education, Balance training, Gait training, Patient/Family education, Stair training, Aquatic Therapy, Dry Needling, Spinal mobilization, Cryotherapy, Moist heat, Taping, Traction, and Manual therapy  PLAN FOR NEXT SESSION: DC'ed  MaStanton KidneyFTharon AquasZiemba MPT 09/15/21 12:57 PM  MaStanton KidneyFTharon AquasZiemba MPT Addended 09/15/21 122p

## 2021-09-17 NOTE — Progress Notes (Unsigned)
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 Asthma flare Chief Complaint  Patient presents with   Acute Visit    Pt c/o cough, wheezing and increased SOB x 2 wks. She is coughing up clear sputum.   prev w/u showed bad reflux  maint at baseline symb 160 2 bid and no proair  02/24/16 called in 7 days of worse cough/ wheeze/sob> rx medrol not clear she took it Exp to gg child sick prior to onset of cough rec Take 4 for three days 3 for three days 2 for three days 1 for three days and stop  Start Pantoprazole (protonix) 40 mg   Take  30-60 min before first meal of the day and Pepcid (famotidine)  20 mg one @  bedtime until return to office - this is the best way to tell whether stomach acid is contributing to your problem.      07/31/2018 acute extended ov/Sonia [name] re:  Worse sob since   Nov 2018  Now on omeprazole '20mg'$  60 min ac  Early march 2020 worse sob despite symbicort 160 2 bid   No cough Sleeping on wedge  Room to room at home  rec Try omeprazole 20 mg x 2 x 30-60 min before your first meal Symbicort 160 Take 2 puffs first thing in am and then another 2 puffs about 12 hours later.  Only use your albuterol as a rescue medication  Please schedule a follow up visit in 3 months but call sooner if needed  with all medications /inhalers/ solutions in hand so we can verify exactly what you are taking. This includes all medications from all doctors and over the  counters   11/05/2018  f/u ov/Sonia [name] re: chronic asthma on ppi 20 mg qd and symb 160 2bid / no meds in hand as req Chief Complaint  Patient presents with   Follow-up    Pt states her breathing has been some worse with the humid weather. She has used her albuterol inhaler 2 x since the last visit.   Dyspnea:  Aerobics/ swimming now again but doesn't do well out in heat/ humidity  Cough: no  Sleeping: no resp issues   SABA use: min hfa/ no neb 02: no rec Work on inhaler technique:   Please schedule a follow up visit in 6 months but call sooner if needed  with all medications /inhalers/ solutions in hand so we can verify exactly what you are taking. This includes all medications from all doctors and over the counters     02/03/2019  f/u ov/Sonia [name] re: chronic asthma, worse since September 2020 with overt gerd  Chief Complaint  Patient presents with   Follow-up    Breathing is okay today. She is using her albuterol inhaler 1-2 x per wk on average.   Dyspnea:  Fine sitting still but doe across the room on symb 160 2bid and did not prev respond to singulair  Cough: worse bending over / non productive, on prilosec 20 mg ac qd  With overt HB Sleeping: no resp symptoms  SABA use: 1-2x weekly when overdoes it, never pre-challenges / has neb but no meds for it x years. 02: none  rec  GERD   Stop symbicort and start Breztri Take 2 puffs first thing in am and then another 2 puffs about 12 hours later. Only use your albuterol as a rescue medication   Discuss with Dr Cristina Gong re optimal options to treat your reflux    05/12/2019  f/u ov/Sonia [name] re:  Chronic asthma vs ACOS  Chief Complaint  Patient presents with   Follow-up    3 month f/u. States breathing has been stable since last visit.   Dyspnea:   MMRC2 = can't walk a nl pace on a flat grade s sob but does fine slow and flat   [employer] Cough: none Sleeping: no resp symptoms  SABA use: same need as previous 02: none  Overt hb/ buccini  following and says "concerned about bones so doesn't want to use PPI" rec No change   Dx April 2021  PMR and placed on placed on prednisone   01/05/2020  f/u ov/Sonia [name] re: ? ACOS  symbicort off ppi only taking tums now on symb 160 and pred 15 mg daily  Chief Complaint  Patient presents with   Follow-up    pt c/o increased sob with exertion, states this is d/t ragweed allergy.    Dyspnea:  Worse off breztri ?  / on pred for PMR   Cough :  None  Sleeping: 2 pillows / bed is flat  SABA use: not sure it helps /never prechallenges as rec  02: none  rec Plan A = Automatic = Always=    Breztri Take 2 puffs first thing in am and then another 2 puffs about 12 hours later x 2 weeks trial > if not better resume the symbicort. Work on inhaler technique:  Plan B = Backup (to supplement plan A, not to replace it) Only use your albuterol inhaler as a rescue medication Plan C = Crisis (instead of Plan B but only if Plan B stops working) - only use your albuterol nebulizer if you first try Plan B   05/24/2020  f/u ov/Sonia [name] re: ACOS / large [place] with GERD/Buccini Chief Complaint  Patient presents with   Follow-up    Increased SOB "since the allergies are out". She rarely uses her albuterol inhaler or neb.  Dyspnea:  Not sure ? HT a little easier but new bladder symptoms on Breztri  Cough: none, does not blow breztri out thru nose Sleeping: 2 pillow/ bed flat  SABA use: rarely use  02: not Covid status:   Not vaccinated  Rec  I very strongly recommend you get the moderna or pfizer vaccine   Plan A = Automatic = Always=   Symbicort 160 Take 2 puffs first thing in am and then another 2 puffs about 12 hours later.  Work on Orthoptist B = Backup (to supplement plan A, not to replace it) Only use your albuterol inhaler as a rescue medication  Plan C = Crisis (instead of Plan B but only if Plan B stops working) - only use your albuterol nebulizer if you first try Plan B and it fails to help  > ok to use the nebulizer up to every 4 hours but if start needing it regularly call for immediate appointment  10/11/2020  f/u ov/Sonia [name] re: ACOS/ large HH back on breztri   prednisone 2-1-'2mg'$  alternating for pmr / changed symbicort to breztri and worse, better back on breztri  Chief Complaint  Patient presents with   Follow-up    Shortness of breath    Dyspnea:  can do ht early in am  Cough: assoc with pnds to point of gagging  /no recent abx Sleeping: bed is 10 degrees with bed blocks plus sev  pillows  SABA use: hfa twice daily / doest not have functioning neb as per AVS recs ("forgot to tell you it wasn't working/ I thought you knew that and had ordered it" - record indicates this was not the case at all - see phone note 09/14/20)  02: none Covid status:   never vax  Rec We need to be sure you have the nebulizer  Plan A = Automatic = Always=    breztri  (or symbicort 160) Take 2 puffs first thing in am and then another 2 puffs about 12 hours later.   Plan B = Backup (to supplement plan A, not to replace it) Only use your albuterol inhaler as a rescue medication Plan C = Crisis (instead of Plan B but only if Plan B stops working) - only use your albuterol nebulizer if you first try Plan B and it fails to help      09/18/2021  f/u ov/Sonia [name] re: ***   maint on ***  No chief complaint on file.   Dyspnea:  *** Cough: *** Sleeping: *** SABA use: *** 02: *** Covid status:   ***   No obvious day to day or daytime variability or assoc excess/ purulent sputum or mucus plugs or hemoptysis or cp or chest tightness, subjective wheeze or overt sinus or hb symptoms.   *** without nocturnal  or early am exacerbation  of respiratory  c/o's or need for noct saba. Also denies any obvious fluctuation of symptoms with weather or environmental changes or other aggravating or alleviating factors except as outlined above   No unusual exposure hx or h/o childhood pna/ asthma or knowledge of premature  birth.  Current Allergies, Complete Past Medical History, Past Surgical History, Family History, and Social History were reviewed in [employer] record.  ROS  The following are not active complaints unless bolded Hoarseness, sore throat, dysphagia, dental problems, itching, sneezing,  nasal congestion or discharge of excess mucus or purulent secretions, ear ache,   fever, chills, sweats, unintended wt loss or wt gain, classically pleuritic or exertional cp,  orthopnea pnd or arm/hand swelling  or leg swelling, presyncope, palpitations, abdominal pain, anorexia, nausea, vomiting, diarrhea  or change in bowel habits or change in bladder habits, change in stools or change in urine, dysuria, hematuria,  rash, arthralgias, visual complaints, headache, numbness, weakness or ataxia or problems with walking or coordination,  change in mood or  memory.        No outpatient medications have been marked as taking for the 09/18/21 encounter (Appointment) with Tanda Rockers, MD.                          Physical Exam:  09/18/2021        ***  10/11/2020         159   05/24/2020       161 01/05/2020     156  05/12/2019         152  02/03/2019     153 11/05/2018       152  07/31/2018       154   03/07/16 160 lb 12.8 oz (72.9 kg)  01/04/16 160 lb (72.6 kg)  04/26/15 159 lb 4.8 oz (72.3 kg)    Vital signs reviewed  10/11/2020  - Note at rest 02 sats  95% on RA     Mild bar***

## 2021-09-18 ENCOUNTER — Ambulatory Visit (INDEPENDENT_AMBULATORY_CARE_PROVIDER_SITE_OTHER): Payer: Medicare Other | Admitting: Internal Medicine

## 2021-09-18 ENCOUNTER — Encounter: Payer: Self-pay | Admitting: Internal Medicine

## 2021-09-18 DIAGNOSIS — J4540 Moderate persistent asthma, uncomplicated: Secondary | ICD-10-CM

## 2021-09-18 DIAGNOSIS — I2510 Atherosclerotic heart disease of native coronary artery without angina pectoris: Secondary | ICD-10-CM | POA: Diagnosis not present

## 2021-09-18 MED ORDER — FAMOTIDINE 20 MG PO TABS: ORAL_TABLET | ORAL | 11 refills | Status: DC

## 2021-09-18 MED ORDER — PANTOPRAZOLE SODIUM 40 MG PO TBEC: 40.0000 mg | DELAYED_RELEASE_TABLET | Freq: Every day | ORAL | 2 refills | Status: DC

## 2021-09-18 NOTE — Assessment & Plan Note (Signed)
Informed at [facility] severe gerd likely the cause with large Sutter on cxr but "turned surgery down" PFT's  07/30/11   FEV1  1.25 (61 % ) ratio 0.47  p 6 % improvement from saba p ? prior to study with DLCO  94 % and  corrects to 141  % for alv volume c classic f/v loop for  chronic asthma    - FENO 03/07/2016  =   9 on symb 160 2bid during acute flare - 07/31/2018     75% > continue symb 160 2bid  - 11/05/2018  After extensive coaching inhaler device,  effectiveness =    75% (short ti)  - 11/05/18  Alpha one AT :   MM   Level 179  - 02/03/2019  After extensive coaching inhaler device,  effectiveness =    75% (short Ti) > rec trial of breztri 2bid > some better 05/12/2019 so continue - 01/05/2020  After extensive coaching inhaler device,  effectiveness =    75% > rechallenge with breztri > ? Urinary retention //uti  > d/c'd 05/24/2020 and restarted symb 160 2bid > better but then breathing worse so restarted breztri - 09/18/2021  After extensive coaching inhaler device,  effectiveness =    75% from a baseline of < 50% (short ti)   DDX of  difficult airways management almost all start with A and  include Adherence, Ace Inhibitors, Acid Reflux, Active Sinus Disease, Alpha 1 Antitripsin deficiency, Anxiety masquerading as Airways dz,  ABPA,  Allergy(esp in young), Aspiration (esp in elderly), Adverse effects of meds,  Active smoking or vaping, A bunch of PE's (a small clot burden can't cause this syndrome unless there is already severe underlying pulm or vascular dz with poor reserve) plus two Bs  = Bronchiectasis and Beta blocker use..and one C= CHF   Adherence is always the initial "prime suspect" and is a multilayered concern that requires a "trust but verify" approach in every patient - starting with knowing how to use medications, especially inhalers, correctly, keeping up with refills and understanding the fundamental difference between maintenance and prns vs those medications only taken for a very  short course and then stopped and not refilled.  - see hfa teaching - return in 2 weeks with all meds in hand using a trust but verify approach to confirm accurate Medication  Reconciliation The principal here is that until we are certain that the  patients are doing what we've asked, it makes no sense to ask them to do more.   ? Acid (or non-acid) GERD > always difficult to exclude as up to 75% of pts in some series report no assoc GI/ Heartburn symptoms> rec max (24h)  acid suppression and diet restrictions/ reviewed and instructions given in writing.   ? Allergy driving asthma > taper pred to lowest effective dose or as limited by rx for PMR and continue high dose ICS  >>> Re SABA :  I spent extra time with pt today reviewing appropriate use of albuterol for prn use on exertion with the following points: 1) saba is for relief of sob that does not improve by walking a slower pace or resting but rather if the pt does not improve after trying this first. 2) If the pt is convinced, as many are, that saba helps recover from activity faster then it's easy to tell if this is the case by re-challenging : ie stop, take the inhaler, then p 5 minutes try the exact  same activity (intensity of workload) that just caused the symptoms and see if they are substantially diminished or not after saba 3) if there is an activity that reproducibly causes the symptoms, try the saba 15 min before the activity on alternate days   If in fact the saba really does help, then fine to continue to use it prn but advised may need to look closer at the maintenance regimen being used to achieve better control of airways disease with exertion.   ? Anxiety > usually at the bottom of this list of usual suspects but   may interfere with adherence and also interpretation of response or lack thereof to symptom management which can be quite subjective.          Each maintenance medication was reviewed in detail including emphasizing  most importantly the difference between maintenance and prns and under what circumstances the prns are to be triggered using an action plan format where appropriate.  Total time for H and P, chart review, counseling, reviewing hfa/neb  device(s) and generating customized AVS unique to this office visit / same day charting > 30 min for   refractory respiratory  symptoms of uncertain etiology

## 2021-09-18 NOTE — Patient Instructions (Addendum)
Pantoprazole (protonix) 40 mg   Take  30-60 min before first meal of the day and Pepcid (famotidine)  20 mg after supper until return to office - this is the best way to tell whether stomach acid is contributing to your problem.    GERD (REFLUX)  is an extremely common cause of respiratory symptoms just like yours , many times with no obvious heartburn at all.    It can be treated with medication, but also with lifestyle changes including elevation of the head of your bed (ideally with 6 -8inch blocks under the headboard of your bed),  Smoking cessation, avoidance of late meals, excessive alcohol, and avoid fatty foods, chocolate, peppermint, colas, red wine, and acidic juices such as orange juice.  NO MINT OR MENTHOL PRODUCTS SO NO COUGH DROPS  USE SUGARLESS CANDY INSTEAD (Jolley ranchers or Stover's or Life Savers) or even ice chips will also do - the key is to swallow to prevent all throat clearing. NO OIL BASED VITAMINS - use powdered substitutes.  Avoid fish oil when coughing.   Plan A = Automatic = Always=    Symbicort 160 Take 2 puffs first thing in am and then another 2 puffs about 12 hours later.   Dr Trudie Reed can taper your prednisone down but not off at this point   Plan B = Backup (to supplement plan A, not to replace it) Only use your albuterol inhaler as a rescue medication to be used if you can't catch your breath by resting or doing a relaxed purse lip breathing pattern.  - The less you use it, the better it will work when you need it. - Ok to use the inhaler up to 2 puffs  every 4 hours if you must but call for appointment if use goes up over your usual need - Don't leave home without it !!  (think of it like the spare tire for your car)   Plan C = Crisis (instead of Plan B but only if Plan B stops working) - only use your albuterol nebulizer if you first try Plan B and it fails to help > ok to use the nebulizer up to every 4 hours but if start needing it regularly call for  immediate appointment  Please schedule a follow up office visit in 2 weeks, sooner if needed  with all medications /inhalers/ solutions in hand so we can verify exactly what you are taking. This includes all medications from all doctors and over the counters

## 2021-09-19 DIAGNOSIS — E538 Deficiency of other specified B group vitamins: Secondary | ICD-10-CM | POA: Diagnosis not present

## 2021-09-19 DIAGNOSIS — E663 Overweight: Secondary | ICD-10-CM | POA: Diagnosis not present

## 2021-09-19 DIAGNOSIS — Z7952 Long term (current) use of systemic steroids: Secondary | ICD-10-CM | POA: Diagnosis not present

## 2021-09-19 DIAGNOSIS — Z6825 Body mass index (BMI) 25.0-25.9, adult: Secondary | ICD-10-CM | POA: Diagnosis not present

## 2021-09-19 DIAGNOSIS — M316 Other giant cell arteritis: Secondary | ICD-10-CM | POA: Diagnosis not present

## 2021-09-19 DIAGNOSIS — M1120 Other chondrocalcinosis, unspecified site: Secondary | ICD-10-CM | POA: Diagnosis not present

## 2021-09-19 DIAGNOSIS — M353 Polymyalgia rheumatica: Secondary | ICD-10-CM | POA: Diagnosis not present

## 2021-09-19 DIAGNOSIS — M797 Fibromyalgia: Secondary | ICD-10-CM | POA: Diagnosis not present

## 2021-09-19 DIAGNOSIS — E559 Vitamin D deficiency, unspecified: Secondary | ICD-10-CM | POA: Diagnosis not present

## 2021-09-19 DIAGNOSIS — M1991 Primary osteoarthritis, unspecified site: Secondary | ICD-10-CM | POA: Diagnosis not present

## 2021-09-20 ENCOUNTER — Telehealth: Payer: Self-pay | Admitting: Internal Medicine

## 2021-09-20 NOTE — Telephone Encounter (Signed)
Printed and faxed. Nothing further needed

## 2021-10-03 ENCOUNTER — Encounter: Payer: Self-pay | Admitting: Internal Medicine

## 2021-10-03 ENCOUNTER — Ambulatory Visit (INDEPENDENT_AMBULATORY_CARE_PROVIDER_SITE_OTHER): Payer: Medicare Other | Admitting: Internal Medicine

## 2021-10-03 DIAGNOSIS — J4540 Moderate persistent asthma, uncomplicated: Secondary | ICD-10-CM | POA: Diagnosis not present

## 2021-10-03 DIAGNOSIS — I2510 Atherosclerotic heart disease of native coronary artery without angina pectoris: Secondary | ICD-10-CM

## 2021-10-03 MED ORDER — ALBUTEROL SULFATE HFA 108 (90 BASE) MCG/ACT IN AERS: INHALATION_SPRAY | RESPIRATORY_TRACT | 11 refills | Status: DC

## 2021-10-03 NOTE — Progress Notes (Signed)
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 Asthma flare Chief Complaint  Patient presents with   Acute Visit    Pt c/o cough, wheezing and increased SOB x 2 wks. She is coughing up clear sputum.   prev w/u showed bad reflux  maint at baseline symb 160 2 bid and no proair  02/24/16 called in 7 days of worse cough/ wheeze/sob> rx medrol not clear she took it Exp to gg child sick prior to onset of cough rec Take 4 for three days 3 for three days 2 for three days 1 for three days and stop  Start Pantoprazole (protonix) 40 mg   Take  30-60 min before first meal of the day and Pepcid (famotidine)  20 mg one @  bedtime until return to office - this is the best way to tell whether stomach acid is contributing to your problem.      07/31/2018 acute extended ov/Sonia [name] re:  Worse sob since   Nov 2018  Now on omeprazole '20mg'$  60 min ac  Early march 2020 worse sob despite symbicort 160 2 bid   No cough Sleeping on wedge  Room to room at home  rec Try omeprazole 20 mg x 2 x 30-60 min before your first meal Symbicort 160 Take 2 puffs first thing in am and then another 2 puffs about 12 hours later.  Only use your albuterol as a rescue medication  Please schedule a follow up visit in 3 months but call sooner if needed  with all medications /inhalers/ solutions in hand so we can verify exactly what you are taking. This includes all medications from all doctors and over the  counters   Dx April 2021  PMR >>>  placed on placed on prednisone   10/11/2020  f/u ov/Sonia [name] re: ACOS/ large HH back on breztri   prednisone 2-1-'2mg'$  alternating for pmr / changed symbicort to breztri and worse, better back on breztri  Chief Complaint  Patient presents with   Follow-up    Shortness of breath    Dyspnea:  can do ht early in am  Cough: assoc with pnds to point of gagging  /no recent abx Sleeping: bed is 10 degrees with bed blocks plus sev  pillows  SABA use: hfa twice daily / doest not have functioning neb as per AVS recs ("forgot to tell you it wasn't working/ I thought you knew that and had ordered it" - record indicates this was not the case at all - see phone note 09/14/20)  02: none Covid status:   never vax  Rec We need to be sure you have the nebulizer  Plan A = Automatic = Always=    breztri  (or symbicort 160) Take 2 puffs first thing in am and then another 2 puffs about 12 hours later.   Plan B = Backup (to supplement plan A, not to replace it) Only use your albuterol  inhaler as a rescue medication Plan C = Crisis (instead of Plan B but only if Plan B stops working) - only use your albuterol nebulizer if you first try Plan B and it fails to help     09/18/2021  f/u ov/Sonia [name] re: ACOS   maint on symbicort 160  2  bid   and way too much albuterol and prednisone 60 mg per day "for asthma"  (40/20 mg) Chief Complaint  Patient presents with   Follow-up    Pt states breathing is about the same from [place]. Pt still using neb treatments x2 daily.  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  Dr Trudie Reed can taper your prednisone down but not off at this point  Plan B = Backup (to supplement plan A, not to replace it) Only use your albuterol inhaler as a rescue medication  Plan C = Crisis (instead of Plan B but only if Plan B stops working) - only use your albuterol nebulizer if you first try Plan B  Please schedule a follow up office visit in 2 weeks, sooner if needed  with all medications /inhalers/ solutions in hand so we can verify exactly what you are taking. This includes all medications from all doctors and over the counters     10/03/2021  f/u ov/Sonia [name] re: ACOS / [place] with gerd  maint on symb 160 2bid  pred 40 mg taper per [facility] Complaint  Patient presents with   Follow-up    2 wk f/u for asthma. States her breathing has not changed since last visit.   Dyspnea:  water exercises/ walking on her own at HT  Cough: none  Sleeping: bed blocks / no need for albuterol hs  SABA use: neb no  longer needed  - only using hfa in am before her symbicort (not following the instructions as per avs copy above)  02: none  Covid status:   vax never / infected once No overt hb unless eats pizza   No obvious day to day or daytime variability or assoc excess/ purulent sputum or mucus plugs or hemoptysis or cp or chest tightness, subjective wheeze or overt sinus symptoms.   Sleeping  without nocturnal  exacerbation  of respiratory  c/o's or need for noct saba. Also denies any obvious fluctuation of symptoms with weather or environmental changes or other aggravating or alleviating factors except as outlined above   No unusual exposure hx or h/o childhood pna/ asthma or knowledge of premature birth.  Current Allergies, Complete Past Medical History, Past Surgical History, Family History, and Social History were reviewed in [facility] record.  ROS  The following are not active complaints unless bolded Hoarseness, sore throat, dysphagia, dental problems, itching, sneezing,  nasal congestion or discharge of excess mucus or purulent  secretions, ear ache,   fever, chills, sweats, unintended wt loss or wt gain, classically pleuritic or exertional cp,  orthopnea pnd or arm/hand swelling  or leg swelling, presyncope, palpitations, abdominal pain, anorexia, nausea, vomiting, diarrhea  or change in bowel habits or change in bladder habits, change in stools or change in urine, dysuria, hematuria,  rash, arthralgias, visual complaints, headache, numbness, weakness or ataxia or problems with walking or coordination,  change in mood or  memory.        Current Meds  Medication Sig   albuterol (PROVENTIL) (2.5 MG/3ML) 0.083% nebulizer solution Take 3 mLs (2.5 mg total) by nebulization every 6 (six) hours as needed for wheezing or shortness of breath. (Patient taking differently: Take 2.5 mg by nebulization every 6 (six) hours as needed for wheezing or shortness of breath. Use 1 nebulizer (2.5 mg) (scheduled) in the morning & may use every 6 hours as needed for wheezing/shortness of breath.)   albuterol (VENTOLIN HFA) 108 (90 Base) MCG/ACT inhaler Inhale 2 puffs into the lungs every 6 (six) hours as needed for wheezing or shortness of breath. (Patient taking differently: Inhale 2 puffs into the lungs See admin instructions. Inhale 2 puffs (scheduled) at bedtime & may use every 6 hours as needed for wheezing/shortness of breath.)   calcium carbonate (TUMS - DOSED IN MG ELEMENTAL CALCIUM) 500 MG chewable tablet Chew 500 mg by mouth daily as needed for indigestion or heartburn.   cetirizine (ZYRTEC) 10 MG tablet Take 10 mg by mouth in the morning.   Cholecalciferol 25 MCG (1000 UT) capsule Take 1,000 Units by mouth every other day.   Cyanocobalamin (B-12) 5000 MCG CAPS Place 5,000 mcg under the tongue every other day.   famotidine  (PEPCID) 20 MG tablet One after supper   levothyroxine (SYNTHROID) 125 MCG tablet Take 125 mcg by mouth daily before breakfast.   montelukast (SINGULAIR) 10 MG tablet Take 1 tablet (10 mg total) by mouth at bedtime.   nitroGLYCERIN (NITROSTAT) 0.4 MG SL tablet Place 0.4 mg under the tongue every 5 (five) minutes x 3 doses as needed for chest pain.   pantoprazole (PROTONIX) 40 MG tablet Take 1 tablet (40 mg total) by mouth daily. Take 30-60 min before first meal of the day   predniSONE (DELTASONE) 20 MG tablet Take 1 tablet (20 mg total) by mouth daily with breakfast. (Patient taking differently: Take 20-40 mg by mouth See admin instructions. Take 2 tablets (40 mg) by mouth in the morning & take 1 tablet (20 mg) by mouth in the evening.)   SYMBICORT 160-4.5 MCG/ACT inhaler INHALE 2 PUFFS BY MOUTH FIRST THING IN THE MORNING AND THEN 2 PUFFS ABOUT 12 HOURS LATER (Patient taking differently: Inhale 2 puffs into the lungs at bedtime.)          Physical Exam:  10/03/2021       148   09/18/2021       148  10/11/2020         159   05/24/2020       161 01/05/2020     156  05/12/2019         152  02/03/2019     153 11/05/2018       152  07/31/2018       154   03/07/16 160 lb 12.8 oz (72.9 kg)  01/04/16 160 lb (72.6 kg)  04/26/15 159 lb 4.8 oz (72.3 kg)     Vital signs reviewed  10/03/2021  - Note  at rest 02 sats  96% on RA   General appearance:    amb wf nad   HEENT : Oropharynx  clear   Nasal turbinates nl   NECK :  without  apparent JVD/ palpable Nodes/TM    LUNGS: no acc muscle use,  Mild barrel  contour chest wall with bilateral  Distant bs s audible wheeze and  without cough on insp or exp maneuvers  and mild  Hyperresonant  to  percussion bilaterally     CV:  RRR  no s3 or murmur or increase in P2, and no edema   ABD:  soft and nontender with pos end  insp Hoover's  in the supine position.  No bruits or organomegaly appreciated   MS:  Nl gait/ ext warm without deformities Or obvious  joint restrictions  calf tenderness, cyanosis or clubbing     SKIN: warm and dry without lesions    NEURO:  alert, approp, nl sensorium with  no motor or cerebellar deficits apparent.

## 2021-10-03 NOTE — Assessment & Plan Note (Addendum)
Informed at [facility] asthma center severe gerd likely the cause with large [place] on cxr but "turned surgery down" PFT's  07/30/11   FEV1  1.25 (61 % ) ratio 0.47  p 6 % improvement from saba p ? prior to study with DLCO  94 % and  corrects to 141  % for alv volume c classic f/v loop for  chronic asthma    - FENO 03/07/2016  =   9 on symb 160 2bid during acute flare - 07/31/2018     75% > continue symb 160 2bid  - 11/05/2018  After extensive coaching inhaler device,  effectiveness =    75% (short ti)  - 11/05/18  Alpha one AT :   MM   Level 179  - 02/03/2019  After extensive coaching inhaler device,  effectiveness =    75% (short Ti) > rec trial of breztri 2bid > some better 05/12/2019 so continue - 01/05/2020  After extensive coaching inhaler device,  effectiveness =    75% > rechallenge with breztri > ? Urinary retention //uti  > d/c'd 05/24/2020 and restarted symb 160 2bid > better but then breathing worse so restarted breztri - 09/18/2021  After extensive coaching inhaler device,  effectiveness =    75% from a baseline of < 50% (short ti)  - 10/03/2021  After extensive coaching inhaler device,  effectiveness =    80% continue symbicort 160 as bladder problems on breztri - 10/03/2021   Walked on RA  x  3  lap(s) =  approx 750  ft  @ mod pace, stopped due to end of study  with lowest 02 sats 96%     DDX of  difficult airways management almost all start with A and  include Adherence, Ace Inhibitors, Acid Reflux, Active Sinus Disease, Alpha 1 Antitripsin deficiency, Anxiety masquerading as Airways dz,  ABPA,  Allergy(esp in young), Aspiration (esp in elderly), Adverse effects of meds,  Active smoking or vaping, A bunch of PE's (a small clot burden can't cause this syndrome unless there is already severe underlying pulm or vascular dz with poor reserve) plus two Bs  = Bronchiectasis and Beta blocker use..and one C= CHF  Adherence is always the initial "prime suspect" and is a multilayered concern that requires a "trust  but verify" approach in every patient - starting with knowing how to use medications, especially inhalers, correctly, keeping up with refills and understanding the fundamental difference between maintenance and prns vs those medications only taken for a very short course and then stopped and not refilled.  - see hfa teaching - did not bring meds as requested, so advised return with all meds in hand using a trust but verify approach to confirm accurate Medication  Reconciliation The principal here is that until we are certain that the  patients are doing what we've asked, it makes no sense to ask them to do more.   ? Asthma/ allergy/ doubt it on such high dose of prednisone but if flares as taper strong consideration for biologic  -  The goal with a chronic steroid dependent illness is always arriving at the lowest effective dose that controls the disease/symptoms and not accepting a set "formula" which is based on statistics or guidelines that don't always take into account patient  variability or the natural hx of the dz in every individual patient, which may well vary over time.  For now therefore I recommend the patient taper ideally to 2.5 mg as tol per  Dr Trudie Reed  ? Acid (or non-acid) GERD > always difficult to exclude as up to 75% of pts in some series report no assoc GI/ Heartburn symptoms> rec continue max (24h)  acid suppression and diet restrictions/ reviewed     ? Active sinus dz > MRI head  6/523 : Mild mucosal thickening in the paranasal sinuses. Clear mastoid air cells.  Alpha one MM noted  ? A bunch of PEs > neg CTa  06/03/21   ? Anxiety/ depression/ deconditioning  > usually at the bottom of this list of usual suspects but prednisone may well be exacerbating anxiety  and may interfere with adherence and also interpretation of response or lack thereof to symptom management which can be quite subjective.   ? Chf/ cardiac asthma > nothing to suggest by cath 04/2019   F/u in 3  months.   Each maintenance medication was reviewed in detail including emphasizing most importantly the difference between maintenance and prns and under what circumstances the prns are to be triggered using an action plan format where appropriate.  Total time for H and P, chart review, counseling, reviewing hfa/neb device(s) , directly observing portions of ambulatory 02 saturation study/ and generating customized AVS unique to this office visit / same day charting  > 30 min for    refractory respiratory  symptoms of uncertain etiology

## 2021-10-03 NOTE — Patient Instructions (Addendum)
Please try symbicort  160 Take 2 puffs first thing in am and then another 2 puffs about 12 hours later.    Work on inhaler technique:  relax and gently blow all the way out then take a nice smooth full deep breath back in, triggering the inhaler at same time you start breathing in.  Hold breath in for at least  5 seconds if you can. Blow out  symbicort  thru nose. Rinse and gargle with water when done.  If mouth or throat bother you at all,  try brushing teeth/gums/tongue with arm and hammer toothpaste/ make a slurry and gargle and spit out.  - remember how golfers warm up to practice optimal technique   Only use your albuterol as a rescue medication to be used if you can't catch your breath by resting or doing a relaxed purse lip breathing pattern.  - The less you use it, the better it will work when you need it. - Ok to use up to 2 puffs  every 4 hours if you must but call for immediate appointment if use goes up over your usual need - Don't leave home without it !!  (think of it like the spare tire for your car)   Ok to try albuterol 15 min before an activity (on alternating days)  that you know would usually make you short of breath and see if it makes any difference and if makes none then don't take albuterol after activity unless you can't catch your breath as this means it's the resting that helps, not the albuterol.       Please schedule a follow up visit in 3 months but call sooner if needed

## 2021-10-06 DIAGNOSIS — L989 Disorder of the skin and subcutaneous tissue, unspecified: Secondary | ICD-10-CM | POA: Diagnosis not present

## 2021-10-06 DIAGNOSIS — D034 Melanoma in situ of scalp and neck: Secondary | ICD-10-CM | POA: Diagnosis not present

## 2021-10-08 NOTE — Progress Notes (Deleted)
Date:  10/08/2021   ID:  Sonia [name], DOB 1940/10/29, MRN [id number]  Chief Complaint:  ***  History of Present Illness: 81 yo female with history of CAD, COPD, GERD and hypothyroidism who is here tdoay for follow up. She was admitted to [facility] May 2018 with c/o left arm pain and found to have a NSTEMI. Cardiac cath May 2018 with severe stenosis in the mid RCA treated with a drug eluting stent. There was a moderate stenosis in the mid LAD. Echo May 2018 with normal LV systolic function, mild valve disease. She was discharged on Brilinta and readmitted one week later with a GI bleed. EGD showed gastritis. She was changed from Brilinta to Plavix and bleeding resolved. Chest pain and dyspnea at times felt to be due to her asthma as it improved with her inhalers. She has not tolerated statins. She cancelled a stress test arranged in March 2019. She was found to have breast cancer and has undergone lumpectomy in 2020. She had left arm pain in early 2021. Cardiac cath 04/14/19 with non-obstructive disease in the LAD and Circumflex and patent stent in the RCA with minimal restenosis. LV systolic function was normal.   She is here today for follow up. The patient denies any chest pain, dyspnea, palpitations, lower extremity edema, orthopnea, PND, dizziness, near syncope or syncope.   Primary Care Physician: Glenis Smoker, MD  Past Medical History:  Diagnosis Date   Arthritis    Asthma    daily and prn inhalers; states good control currently   Breast cancer [facility])    right   CAD S/P percutaneous coronary angioplasty 07/15/2016   COPD (chronic obstructive pulmonary disease) (HCC)    Dyspnea    occ with exertion   Fibromyalgia    GERD (gastroesophageal reflux disease)    no current med.   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   Vitamin D deficiency     Past Surgical History:  Procedure Laterality Date   ABDOMINAL HYSTERECTOMY     partial   APPENDECTOMY     BREAST BIOPSY Left    BREAST CYST EXCISION Left    BREAST EXCISIONAL BIOPSY Left 03/2015   BREAST LUMPECTOMY Right 08/2018   BREAST LUMPECTOMY WITH RADIOACTIVE SEED LOCALIZATION Left 04/07/2015   Procedure: BREAST LUMPECTOMY WITH RADIOACTIVE SEED LOCALIZATION;  Surgeon: Erroll Luna, MD;  Location: [facility];  Service: General;  Laterality: Left;   BREAST LUMPECTOMY WITH RADIOACTIVE SEED LOCALIZATION Right 08/13/2018   Procedure: RIGHT BREAST LUMPECTOMY WITH RADIOACTIVE SEED LOCALIZATION;  Surgeon: Rolm Bookbinder, MD;  Location: [place];  Service: General;  Laterality: Right;   COLONOSCOPY     CORONARY STENT INTERVENTION N/A 07/15/2016   Procedure: Coronary Stent Intervention;  Surgeon: Sherren Mocha, MD;  Location: [place] CV [facility];  Service: Cardiovascular;  Laterality: N/A;   ESOPHAGOGASTRODUODENOSCOPY (EGD) WITH PROPOFOL Left 07/25/2016   Procedure: ESOPHAGOGASTRODUODENOSCOPY (EGD) WITH PROPOFOL;  Surgeon: Ronnette Juniper, MD;  Location: [facility];  Service: Gastroenterology;  Laterality: Left;   KNEE ARTHROSCOPY Right 12/07/2003  LAPAROSCOPIC SALPINGO OOPHERECTOMY Right 09/08/2003   LEFT HEART CATH N/A 07/15/2016   Procedure: Left Heart Cath;  Surgeon: Sherren Mocha, MD;  Location: [place] [facility];  Service: Cardiovascular;  Laterality: N/A;   LEFT HEART CATH AND CORONARY ANGIOGRAPHY N/A 04/14/2019   Procedure: LEFT HEART CATH AND CORONARY ANGIOGRAPHY;  Surgeon: Burnell Blanks, MD;  Location: [place] [facility];  Service: Cardiovascular;  Laterality: N/A;   LYSIS OF ADHESION  09/08/2003   NASAL SINUS  SURGERY  age 77   RECTAL POLYPECTOMY  10/02/2006    Current Outpatient Medications  Medication Sig Dispense Refill   albuterol (PROVENTIL) (2.5 MG/3ML) 0.083% nebulizer solution Take 3 mLs (2.5 mg total) by nebulization every 6 (six) hours as needed for wheezing or shortness of breath. (Patient taking differently: Take 2.5 mg by nebulization every 6 (six) hours as needed for wheezing or shortness of breath. Use 1 nebulizer (2.5 mg) (scheduled) in the morning & may use every 6 hours as needed for wheezing/shortness of breath.) 360 mL 11   albuterol (VENTOLIN HFA) 108 (90 Base) MCG/ACT inhaler 2 puffs every 4 hours if needed 27 g 11   calcium carbonate (TUMS - DOSED IN MG ELEMENTAL CALCIUM) 500 MG chewable tablet Chew 500 mg by mouth daily as needed for indigestion or heartburn.     cetirizine (ZYRTEC) 10 MG tablet Take 10 mg by mouth in the morning.     Cholecalciferol 25 MCG (1000 UT) capsule Take 1,000 Units by mouth every other day.     Cyanocobalamin (B-12) 5000 MCG CAPS Place 5,000 mcg under the tongue every other day.     famotidine (PEPCID) 20 MG tablet One after supper 30 tablet 11   levothyroxine (SYNTHROID) 125 MCG tablet Take 125 mcg by mouth daily before breakfast.     montelukast (SINGULAIR) 10 MG tablet Take 1 tablet (10 mg total) by mouth at bedtime. 30 tablet 11   nitroGLYCERIN (NITROSTAT) 0.4 MG SL tablet Place 0.4 mg under the tongue every 5 (five) minutes x 3 doses as needed for chest pain.     pantoprazole (PROTONIX) 40 MG tablet Take 1 tablet (40 mg total) by mouth daily. Take 30-60 min before first meal of the day 30 tablet 2   predniSONE (DELTASONE) 20 MG tablet Take 1 tablet (20 mg total) by mouth daily with breakfast. (Patient taking differently: Take 20-40 mg by mouth See admin instructions. Take 2 tablets (40 mg) by mouth in the morning & take 1 tablet (20 mg) by mouth in the evening.) 5 tablet 0   SYMBICORT 160-4.5 MCG/ACT inhaler INHALE 2 PUFFS BY MOUTH FIRST THING IN  THE MORNING AND THEN 2 PUFFS ABOUT 12 HOURS LATER (Patient taking differently: Inhale 2 puffs into the lungs at bedtime.) 33 g 3   No current facility-administered medications for this visit.    Allergies  Allergen Reactions   Adenosine Anaphylaxis   Contrast Media [Iodinated Contrast Media] Anaphylaxis   Influenza Virus Vaccine Anaphylaxis   Nitrofurantoin Itching   Milk ([place]ow)     Other reaction(s): Other Other Reaction: Other reaction   Breztri Aerosphere [Budeson-Glycopyrrol-Formoterol] Other (See Comments)    Urinary retention.   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  Tobacco Use   Smoking status: Never    Passive exposure: Past   Smokeless tobacco: Never  Vaping Use   Vaping Use: Never used  Substance and Sexual Activity   Alcohol use: Not Currently    Comment: rarely   Drug use: No   Sexual activity: Not Currently    Birth control/protection: Surgical    Comment: Hysterectomy  Other Topics Concern   Not on file  Social History Narrative   Not on file   Social Determinants of Health   Financial Resource Strain: Not on file  Food Insecurity: Not on file  Transportation Needs: Not on file  Physical Activity: Not on file  Stress: Not on file  Social Connections: Not on file  Intimate Partner Violence: Not on file    Family History  Problem Relation Age of Onset   Asthma Mother    Heart disease Mother    Asthma Sister    Heart disease Sister    Breast cancer Sister 45   Heart disease Father    Kidney disease Father    Prostate cancer Brother        dx. 51s   Heart attack Brother 52   Alzheimer's disease  Brother 71   Breast cancer Maternal Aunt 70   Stomach cancer Maternal Grandfather 6   Asthma Sister    Memory loss Sister    Heart attack Son 72   Spinal muscular atrophy Grandchild        type II   Cancer Other 20       niece dx. ca of salivary gland    Breast cancer Other 44       niece; negative genetic testing 10 years ago   Melanoma Other    Alzheimer's disease Maternal Aunt    Cervical cancer Cousin        maternal 1st cousin dx. at 54   Breast cancer Cousin 18       maternal 1st cousin   Lung cancer Cousin 66       maternal 1st cousin; smoker   Heart disease Cousin    Leukemia Cousin 14       paternal 1st cousin   Breast cancer Other        paternal great grandmother (PGF's mother) dx. late 1s-early 22s   Coronary artery disease Other     Review of Systems:  As stated in the HPI and otherwise negative.   There were no vitals taken for this visit.  Physical Examination:  General: Well developed, well nourished, NAD  HEENT: OP clear, mucus membranes moist  SKIN: warm, dry. No rashes. Neuro: No focal deficits  Musculoskeletal: Muscle strength 5/5 all ext  Psychiatric: Mood and affect normal  Neck: No JVD, no carotid bruits, no thyromegaly, no lymphadenopathy.  Lungs:Clear bilaterally, no wheezes, rhonci, crackles Cardiovascular: Regular rate and rhythm. No murmurs, gallops or rubs. Abdomen:Soft. Bowel sounds present. Non-tender.  Extremities: No lower extremity edema. Pulses are 2 + in the bilateral DP/PT.  Echo 07/16/16: - Left ventricle: The cavity size was normal. Systolic function was   normal. The estimated ejection fraction was in the range of 60%   to 65%. Wall motion was normal; there were no regional wall   motion abnormalities. Features are consistent with a pseudonormal   left ventricular filling pattern, with concomitant abnormal  relaxation and increased filling pressure (grade 2 diastolic   dysfunction). Doppler parameters are consistent with  high   ventricular filling pressure. - Mitral valve: Calcified annulus. There was mild regurgitation. - Tricuspid valve: There was mild regurgitation. - Pulmonic valve: There was trivial regurgitation. - Pulmonary arteries: PA peak pressure: 37 mm Hg (S).  Impressions:  - The right ventricular systolic pressure was increased consistent   with mild pulmonary hypertension.  EKG:  EKG is *** ordered today. The ekg ordered today demonstrates   Recent Labs: 06/02/2021: B Natriuretic Peptide 32.9 08/14/2021: ALT 12; BUN 22; Creatinine, Ser 1.02; Hemoglobin 14.7; Platelets 196; Potassium 4.1; Sodium 142   Lipid Panel    Component Value Date/Time   CHOL 174 07/26/2017 1001   TRIG 65 07/26/2017 1001   HDL 76 07/26/2017 1001   CHOLHDL 2.5 01/16/2017 1036   CHOLHDL 3.0 07/13/2016 2331   VLDL 12 07/13/2016 2331   LDLCALC 85 07/26/2017 1001     Wt Readings from Last 3 Encounters:  10/03/21 148 lb (67.1 kg)  09/18/21 148 lb 3.2 oz (67.2 kg)  08/21/21 143 lb (64.9 kg)     Other studies Reviewed: Additional studies/ records that were reviewed today include: . Review of the above records demonstrates:   Assessment and Plan:   1. CAD with angina: She has no chest pain. Will continue ASA and *** statin. She only tolerates low doses of statins.   2. HLD: LDL ***. ? If taking Crestor.    Current medicines are reviewed at length with the patient today.  The patient does not have concerns regarding medicines.  The following changes have been made:  no change  Labs/ tests ordered today include:   No orders of the defined types were placed in this encounter.    Disposition:   FU with me in 12 months   Signed, [name], MD 10/08/2021 9:48 AM    [address], [place], [place]  [address] Phone: [phone]; Fax: [phone]

## 2021-10-09 ENCOUNTER — Ambulatory Visit: Payer: Medicare Other | Admitting: Cardiovascular Disease

## 2021-10-17 DIAGNOSIS — M797 Fibromyalgia: Secondary | ICD-10-CM | POA: Diagnosis not present

## 2021-10-17 DIAGNOSIS — M1991 Primary osteoarthritis, unspecified site: Secondary | ICD-10-CM | POA: Diagnosis not present

## 2021-10-17 DIAGNOSIS — Z6825 Body mass index (BMI) 25.0-25.9, adult: Secondary | ICD-10-CM | POA: Diagnosis not present

## 2021-10-17 DIAGNOSIS — M353 Polymyalgia rheumatica: Secondary | ICD-10-CM | POA: Diagnosis not present

## 2021-10-17 DIAGNOSIS — E663 Overweight: Secondary | ICD-10-CM | POA: Diagnosis not present

## 2021-10-17 DIAGNOSIS — M316 Other giant cell arteritis: Secondary | ICD-10-CM | POA: Diagnosis not present

## 2021-10-17 DIAGNOSIS — M1120 Other chondrocalcinosis, unspecified site: Secondary | ICD-10-CM | POA: Diagnosis not present

## 2021-10-17 DIAGNOSIS — Z7952 Long term (current) use of systemic steroids: Secondary | ICD-10-CM | POA: Diagnosis not present

## 2021-10-31 DIAGNOSIS — E039 Hypothyroidism, unspecified: Secondary | ICD-10-CM | POA: Diagnosis not present

## 2021-11-03 ENCOUNTER — Ambulatory Visit: Payer: Medicare Other | Admitting: Cardiovascular Disease

## 2021-11-24 DIAGNOSIS — Z6825 Body mass index (BMI) 25.0-25.9, adult: Secondary | ICD-10-CM | POA: Diagnosis not present

## 2021-11-24 DIAGNOSIS — Z7952 Long term (current) use of systemic steroids: Secondary | ICD-10-CM | POA: Diagnosis not present

## 2021-11-24 DIAGNOSIS — M1120 Other chondrocalcinosis, unspecified site: Secondary | ICD-10-CM | POA: Diagnosis not present

## 2021-11-24 DIAGNOSIS — M1991 Primary osteoarthritis, unspecified site: Secondary | ICD-10-CM | POA: Diagnosis not present

## 2021-11-24 DIAGNOSIS — E663 Overweight: Secondary | ICD-10-CM | POA: Diagnosis not present

## 2021-11-24 DIAGNOSIS — M316 Other giant cell arteritis: Secondary | ICD-10-CM | POA: Diagnosis not present

## 2021-11-24 DIAGNOSIS — M353 Polymyalgia rheumatica: Secondary | ICD-10-CM | POA: Diagnosis not present

## 2021-12-22 ENCOUNTER — Telehealth: Payer: Self-pay | Admitting: Internal Medicine

## 2021-12-22 DIAGNOSIS — E039 Hypothyroidism, unspecified: Secondary | ICD-10-CM

## 2021-12-22 MED ORDER — SYMBICORT 160-4.5 MCG/ACT IN AERO: 2.0000 | INHALATION_SPRAY | Freq: Two times a day (BID) | RESPIRATORY_TRACT | 3 refills | Status: DC

## 2021-12-22 NOTE — Telephone Encounter (Signed)
Renato Shin

## 2021-12-22 NOTE — Telephone Encounter (Signed)
Patient needs 1 refill of Symbicort sent to [facility] to last her to her appt 10/31. She has been out of it for a while and her breathing is getting bad.   She wanted Dr. Gustavus Bryant recommendation on a endocrinologist near her, but not on [address].  Also wanted to let MW know that her TSH levels done on 9/5 were 9.5 so her symptoms around that time were due to that, but it is not under control.  Please advise, call back [phone].

## 2021-12-22 NOTE — Telephone Encounter (Signed)
Referral to endocrinology has been placed. Attempted to call pt but unable to reach. Left her a detailed message letting her know this was done. Nothing further needed.

## 2021-12-22 NOTE — Telephone Encounter (Signed)
Rx for pt's Symbicort has been sent to pharmacy for pt. Attempted to call pt to let her know this had been done but unable to reach. Left her a detailed message letting her know that the Rx was sent in and that we were going to send the message to Dr. Melvyn Novas about an endocrinologist recommendation.  Dr. Melvyn Novas, please advise on an endocrinologist that you recommend that is near pt but not on [facility].

## 2021-12-26 NOTE — Progress Notes (Deleted)
No chief complaint on file.  History of Present Illness: 81 yo female with history of CAD, COPD, GERD and hypothyroidism who is here today for cardiac follow up. She was admitted to [facility] May 2018 with c/o left arm pain and found to have a NSTEMI. Cardiac cath May 2018 with severe stenosis in the mid RCA treated with a drug eluting stent. There was a moderate stenosis in the mid LAD. Echo May 2018 with normal LV systolic function, mild valve disease. She was discharged on Brilinta and readmitted one week later with a GI bleed. EGD showed gastritis. She was changed from Brilinta to Plavix and bleeding resolved. Chest pain and dyspnea at times felt to be due to her asthma as it improved with her inhalers. She has not tolerated statins. She cancelled a stress test arranged in March 2019. She was found to have breast cancer and has undergone lumpectomy in 2020. She was seen in our office in January 2021 with c/o left arm pain. Cardiac cath January 2021 with  non-obstructive disease in the LAD and Circumflex and patent stent in the RCA with minimal restenosis. LV systolic function was normal.  She is here today for follow up. The patient denies any chest pain, dyspnea, palpitations, lower extremity edema, orthopnea, PND, dizziness, near syncope or syncope.    Primary Care Physician: Glenis Smoker, MD  Past Medical History:  Diagnosis Date   Arthritis    Asthma    daily and prn inhalers; states good control currently   Breast cancer [facility])    right   CAD S/P percutaneous coronary angioplasty 07/15/2016   COPD (chronic obstructive pulmonary disease) (HCC)    Dyspnea    occ with exertion   Fibromyalgia    GERD (gastroesophageal reflux disease)    no current med.   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   Vitamin D deficiency     Past Surgical History:  Procedure Laterality Date   ABDOMINAL HYSTERECTOMY     partial   APPENDECTOMY     BREAST BIOPSY Left    BREAST CYST EXCISION Left    BREAST EXCISIONAL BIOPSY Left 03/2015   BREAST LUMPECTOMY Right 08/2018   BREAST LUMPECTOMY WITH RADIOACTIVE SEED LOCALIZATION Left 04/07/2015   Procedure: BREAST LUMPECTOMY WITH RADIOACTIVE SEED LOCALIZATION;  Surgeon: Erroll Luna, MD;  Location: [facility];  Service: General;  Laterality: Left;   BREAST LUMPECTOMY WITH RADIOACTIVE SEED LOCALIZATION Right 08/13/2018   Procedure: RIGHT BREAST LUMPECTOMY WITH RADIOACTIVE SEED LOCALIZATION;  Surgeon: Rolm Bookbinder, MD;  Location: [place];  Service: General;  Laterality: Right;   COLONOSCOPY     CORONARY STENT INTERVENTION N/A 07/15/2016   Procedure: Coronary Stent Intervention;  Surgeon: Sherren Mocha, MD;  Location: [place] [facility];  Service: Cardiovascular;  Laterality: N/A;   ESOPHAGOGASTRODUODENOSCOPY (EGD) WITH PROPOFOL Left 07/25/2016   Procedure: ESOPHAGOGASTRODUODENOSCOPY (EGD) WITH PROPOFOL;  Surgeon: Ronnette Juniper, MD;  Location: [place];  Service: Gastroenterology;  Laterality: Left;   KNEE ARTHROSCOPY Right 12/07/2003   LAPAROSCOPIC SALPINGO OOPHERECTOMY Right 09/08/2003  LEFT HEART CATH N/A 07/15/2016   Procedure: Left Heart Cath;  Surgeon: Sherren Mocha, MD;  Location: [place] [facility];  Service: Cardiovascular;  Laterality: N/A;   LEFT HEART CATH AND CORONARY ANGIOGRAPHY N/A 04/14/2019   Procedure: LEFT HEART CATH AND CORONARY ANGIOGRAPHY;  Surgeon: Burnell Blanks, MD;  Location: [place] [facility];  Service: Cardiovascular;  Laterality: N/A;   LYSIS OF ADHESION  09/08/2003   NASAL SINUS SURGERY  age 58   RECTAL POLYPECTOMY   10/02/2006    Current Outpatient Medications  Medication Sig Dispense Refill   albuterol (PROVENTIL) (2.5 MG/3ML) 0.083% nebulizer solution Take 3 mLs (2.5 mg total) by nebulization every 6 (six) hours as needed for wheezing or shortness of breath. (Patient taking differently: Take 2.5 mg by nebulization every 6 (six) hours as needed for wheezing or shortness of breath. Use 1 nebulizer (2.5 mg) (scheduled) in the morning & may use every 6 hours as needed for wheezing/shortness of breath.) 360 mL 11   albuterol (VENTOLIN HFA) 108 (90 Base) MCG/ACT inhaler 2 puffs every 4 hours if needed 27 g 11   calcium carbonate (TUMS - DOSED IN MG ELEMENTAL CALCIUM) 500 MG chewable tablet Chew 500 mg by mouth daily as needed for indigestion or heartburn.     cetirizine (ZYRTEC) 10 MG tablet Take 10 mg by mouth in the morning.     Cholecalciferol 25 MCG (1000 UT) capsule Take 1,000 Units by mouth every other day.     Cyanocobalamin (B-12) 5000 MCG CAPS Place 5,000 mcg under the tongue every other day.     famotidine (PEPCID) 20 MG tablet One after supper 30 tablet 11   levothyroxine (SYNTHROID) 125 MCG tablet Take 125 mcg by mouth daily before breakfast.     montelukast (SINGULAIR) 10 MG tablet Take 1 tablet (10 mg total) by mouth at bedtime. 30 tablet 11   nitroGLYCERIN (NITROSTAT) 0.4 MG SL tablet Place 0.4 mg under the tongue every 5 (five) minutes x 3 doses as needed for chest pain.     pantoprazole (PROTONIX) 40 MG tablet Take 1 tablet (40 mg total) by mouth daily. Take 30-60 min before first meal of the day 30 tablet 2   predniSONE (DELTASONE) 20 MG tablet Take 1 tablet (20 mg total) by mouth daily with breakfast. (Patient taking differently: Take 20-40 mg by mouth See admin instructions. Take 2 tablets (40 mg) by mouth in the morning & take 1 tablet (20 mg) by mouth in the evening.) 5 tablet 0   SYMBICORT 160-4.5 MCG/ACT inhaler Inhale 2 puffs into the lungs in the morning and at bedtime. 33 g 3   No  current facility-administered medications for this visit.    Allergies  Allergen Reactions   Adenosine Anaphylaxis   Contrast Media [Iodinated Contrast Media] Anaphylaxis   Influenza Virus Vaccine Anaphylaxis   Nitrofurantoin Itching   Milk ([place]ow)     Other reaction(s): Other Other Reaction: Other reaction   Breztri Aerosphere [Budeson-Glycopyrrol-Formoterol] Other (See Comments)    Urinary retention.   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  Tobacco Use   Smoking status: Never    Passive exposure: Past   Smokeless tobacco: Never  Vaping Use   Vaping Use: Never used  Substance and Sexual Activity   Alcohol use: Not Currently    Comment: rarely   Drug use: No   Sexual activity: Not Currently    Birth control/protection: Surgical    Comment: Hysterectomy  Other Topics Concern   Not on file  Social History Narrative   Not on file   Social Determinants of Health   Financial Resource Strain: Not on file  Food Insecurity: Not on file  Transportation Needs: Not on file  Physical Activity: Not on file  Stress: Not on file  Social Connections: Not on file  Intimate Partner Violence: Not on file    Family History  Problem Relation Age of Onset   Asthma Mother    Heart disease Mother    Asthma Sister    Heart disease Sister    Breast cancer Sister 1   Heart disease Father    Kidney disease Father    Prostate cancer Brother        dx. 61s   Heart attack Brother 42   Alzheimer's disease Brother 94   Breast cancer Maternal Aunt 70   Stomach cancer Maternal Grandfather 70   Asthma Sister    Memory loss Sister    Heart  attack Son 97   Spinal muscular atrophy Grandchild        type II   Cancer Other 75       niece dx. ca of salivary gland    Breast cancer Other 20       niece; negative genetic testing 10 years ago   Melanoma Other    Alzheimer's disease Maternal Aunt    Cervical cancer Cousin        maternal 1st cousin dx. at 78   Breast cancer Cousin 11       maternal 1st cousin   Lung cancer Cousin 70       maternal 1st cousin; smoker   Heart disease Cousin    Leukemia Cousin 14       paternal 1st cousin   Breast cancer Other        paternal great grandmother (PGF's mother) dx. late 29s-early 39s   Coronary artery disease Other     Review of Systems:  As stated in the HPI and otherwise negative.   There were no vitals taken for this visit.  Physical Examination:  General: Well developed, well nourished, NAD  HEENT: OP clear, mucus membranes moist  SKIN: warm, dry. No rashes. Neuro: No focal deficits  Musculoskeletal: Muscle strength 5/5 all ext  Psychiatric: Mood and affect normal  Neck: No JVD, no carotid bruits, no thyromegaly, no lymphadenopathy.  Lungs:Clear bilaterally, no wheezes, rhonci, crackles Cardiovascular: Regular rate and rhythm. No murmurs, gallops or rubs. Abdomen:Soft. Bowel sounds present. Non-tender.  Extremities: No lower extremity edema. Pulses are 2 + in the bilateral DP/PT.  Echo 07/16/16: - Left ventricle: The cavity size was normal. Systolic function was   normal. The estimated ejection fraction was in the range of 60%   to 65%. Wall motion was normal; there were no regional wall   motion abnormalities. Features are consistent with a pseudonormal   left ventricular filling pattern, with concomitant abnormal   relaxation and increased filling pressure (grade 2 diastolic   dysfunction). Doppler parameters are consistent with high   ventricular filling pressure. - Mitral  valve: Calcified annulus. There was mild regurgitation. - Tricuspid valve: There was mild  regurgitation. - Pulmonic valve: There was trivial regurgitation. - Pulmonary arteries: PA peak pressure: 37 mm Hg (S).  Impressions:  - The right ventricular systolic pressure was increased consistent   with mild pulmonary hypertension.  EKG:  EKG is *** ordered today. The ekg ordered today demonstrates   Recent Labs: 06/02/2021: B Natriuretic Peptide 32.9 08/14/2021: ALT 12; BUN 22; Creatinine, Ser 1.02; Hemoglobin 14.7; Platelets 196; Potassium 4.1; Sodium 142   Lipid Panel    Component Value Date/Time   CHOL 174 07/26/2017 1001   TRIG 65 07/26/2017 1001   HDL 76 07/26/2017 1001   CHOLHDL 2.5 01/16/2017 1036   CHOLHDL 3.0 07/13/2016 2331   VLDL 12 07/13/2016 2331   LDLCALC 85 07/26/2017 1001     Wt Readings from Last 3 Encounters:  10/03/21 148 lb (67.1 kg)  09/18/21 148 lb 3.2 oz (67.2 kg)  08/21/21 143 lb (64.9 kg)     Assessment and Plan:   1. CAD with angina: No chest pain or arm pain suggestive of angina. *** She has stopped her ASA and statin. ***  2. HLD: LDL ***. She stopped her statin  Labs/ tests ordered today include:  No orders of the defined types were placed in this encounter.   Disposition:   FU with me in 3 months   Signed, [name], MD 12/26/2021 10:00 AM    [address], [place], [place]  [address] Phone: [phone]; Fax: [phone]

## 2021-12-27 ENCOUNTER — Ambulatory Visit: Payer: Medicare Other | Admitting: Cardiovascular Disease

## 2021-12-28 ENCOUNTER — Ambulatory Visit: Payer: Medicare Other | Admitting: Internal Medicine

## 2021-12-28 DIAGNOSIS — E038 Other specified hypothyroidism: Secondary | ICD-10-CM | POA: Diagnosis not present

## 2022-01-09 ENCOUNTER — Encounter: Payer: Self-pay | Admitting: Internal Medicine

## 2022-01-09 ENCOUNTER — Ambulatory Visit (INDEPENDENT_AMBULATORY_CARE_PROVIDER_SITE_OTHER): Payer: Medicare Other | Admitting: Internal Medicine

## 2022-01-09 DIAGNOSIS — J4540 Moderate persistent asthma, uncomplicated: Secondary | ICD-10-CM | POA: Diagnosis not present

## 2022-01-09 MED ORDER — ALBUTEROL SULFATE HFA 108 (90 BASE) MCG/ACT IN AERS
INHALATION_SPRAY | RESPIRATORY_TRACT | 11 refills | Status: DC
Start: 2022-01-09 — End: 2022-03-16

## 2022-01-09 NOTE — Progress Notes (Unsigned)
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 Asthma flare Chief Complaint  Patient presents with   Acute Visit    Pt c/o cough, wheezing and increased SOB x 2 wks. She is coughing up clear sputum.   prev w/u showed bad reflux  maint at baseline symb 160 2 bid and no proair  02/24/16 called in 7 days of worse cough/ wheeze/sob> rx medrol not clear she took it Exp to gg child sick prior to onset of cough rec Take 4 for three days 3 for three days 2 for three days 1 for three days and stop  Start Pantoprazole (protonix) 40 mg   Take  30-60 min before first meal of the day and Pepcid (famotidine)  20 mg one @  bedtime until return to office - this is the best way to tell whether stomach acid is contributing to your problem.      07/31/2018 acute extended ov/Shamond Skelton re:  Worse sob since   Nov 2018  Now on omeprazole '20mg'$  60 min ac  Early march 2020 worse sob despite symbicort 160 2 bid   No cough Sleeping on wedge  Room to room at home  rec Try omeprazole 20 mg x 2 x 30-60 min before your first meal Symbicort 160 Take 2 puffs first thing in am and then another 2 puffs about 12 hours later.  Only use your albuterol as a rescue medication  Please schedule a follow up visit in 3 months but call sooner if needed  with all medications /inhalers/ solutions in hand so we can verify exactly what you are taking. This includes all medications from all doctors and over the  counters   Dx April 2021  PMR >>>  placed on placed on prednisone   10/11/2020  f/u ov/Katriona Schmierer re: ACOS/ large HH back on breztri   prednisone 2-1-'2mg'$  alternating for pmr / changed symbicort to breztri and worse, better back on breztri  Chief Complaint  Patient presents with   Follow-up    Shortness of breath    Dyspnea:  can do ht early in am  Cough: assoc with pnds to point of gagging  /no recent abx Sleeping: bed is 10 degrees with bed blocks plus sev  pillows  SABA use: hfa twice daily / doest not have functioning neb as per AVS recs ("forgot to tell you it wasn't working/ I thought you knew that and had ordered it" - record indicates this was not the case at all - see phone note 09/14/20)  02: none Covid status:   never vax  Rec We need to be sure you have the nebulizer  Plan A = Automatic = Always=    breztri  (or symbicort 160) Take 2 puffs first thing in am and then another 2 puffs about 12 hours later.   Plan B = Backup (to supplement plan A, not to replace it) Only use your albuterol  inhaler as a rescue medication Plan C = Crisis (instead of Plan B but only if Plan B stops working) - only use your albuterol nebulizer if you first try Plan B and it fails to help    01/09/2022  f/u ov/Kirsti Mcalpine re: ACOS /HH   maint on symb 160 and prednisone 10 mg daily for PMR  Chief Complaint  Patient presents with   Follow-up    Using Albuterol everyday and 10 mg prednisone right now from PCP  Dyspnea:  MMRC2 = can't walk a nl pace on a flat grade s sob but does fine slow and flat  Cough: none  Sleeping: bed blocks no res cc  SABA use: no neb / still not clear on when to use saba 02: none  Covid status:   never vax/ infected x one      No obvious day to day or daytime variability or assoc excess/ purulent sputum or mucus plugs or hemoptysis or cp or chest tightness, subjective wheeze or overt sinus or hb symptoms.   Sleeping as above without nocturnal  or early am exacerbation  of respiratory   c/o's or need for noct saba. Also denies any obvious fluctuation of symptoms with weather or environmental changes or other aggravating or alleviating factors except as outlined above   No unusual exposure hx or h/o childhood pna/ asthma or knowledge of premature birth.  Current Allergies, Complete Past Medical History, Past Surgical History, Family History, and Social History were reviewed in [facility] record.  ROS  The following are not active complaints unless bolded Hoarseness, sore throat, dysphagia, dental problems, itching, sneezing,  nasal congestion or discharge of excess mucus or purulent secretions, ear ache,   fever, chills, sweats, unintended wt loss or wt gain, classically pleuritic or exertional cp,  orthopnea pnd or arm/hand swelling  or leg swelling, presyncope, palpitations, abdominal pain, anorexia, nausea, vomiting, diarrhea  or change in bowel habits or change in bladder habits, change in stools or change in urine, dysuria, hematuria,  rash, arthralgias, visual complaints, headache, numbness, weakness or ataxia or problems with walking or coordination,  change in mood or  memory.        Current Meds  Medication Sig   albuterol (PROVENTIL) (2.5 MG/3ML) 0.083% nebulizer solution Take 3 mLs (2.5 mg total) by nebulization every 6 (six) hours as needed for wheezing or shortness of breath. (Patient taking differently: Take 2.5 mg by nebulization every 6 (six) hours as needed for wheezing or shortness of breath. Use 1 nebulizer (2.5 mg) (scheduled) in the morning & may use every 6 hours as needed for wheezing/shortness of breath.)   albuterol (VENTOLIN HFA) 108 (90 Base) MCG/ACT inhaler 2 puffs every 4 hours if needed   calcium carbonate (TUMS - DOSED IN MG ELEMENTAL CALCIUM) 500 MG chewable tablet Chew 500 mg by mouth daily as needed for indigestion or heartburn.   cetirizine (ZYRTEC) 10 MG tablet Take 10 mg by mouth in the morning.   Cholecalciferol 25 MCG  (1000 UT) capsule Take 1,000 Units by mouth every other day.   Cyanocobalamin (B-12) 5000 MCG CAPS Place 5,000 mcg under the tongue every other day.   famotidine (PEPCID) 20 MG tablet One after supper   levothyroxine (SYNTHROID) 125 MCG tablet Take 125 mcg by mouth daily before breakfast.   montelukast (SINGULAIR) 10 MG tablet Take 1 tablet (10 mg total) by mouth at bedtime.   nitroGLYCERIN (NITROSTAT) 0.4 MG SL tablet Place 0.4 mg under the  tongue every 5 (five) minutes x 3 doses as needed for chest pain.   pantoprazole (PROTONIX) 40 MG tablet Take 1 tablet (40 mg total) by mouth daily. Take 30-60 min before first meal of the day   predniSONE (DELTASONE) 20 MG tablet Take 1 tablet (20 mg total) by mouth daily with breakfast. (Patient taking differently: Take 20-40 mg by mouth See admin instructions. Take 2 tablets (40 mg) by mouth in the morning & take 1 tablet (20 mg) by mouth in the evening.)   SYMBICORT 160-4.5 MCG/ACT inhaler Inhale 2 puffs into the lungs in the morning and at bedtime.                     Physical Exam:  01/09/2022     153  10/03/2021       148   09/18/2021       148  10/11/2020         159   05/24/2020       161 01/05/2020     156  05/12/2019         152  02/03/2019     153 11/05/2018       152  07/31/2018       154   03/07/16 160 lb 12.8 oz (72.9 kg)  01/04/16 160 lb (72.6 kg)  04/26/15 159 lb 4.8 oz (72.3 kg)    Vital signs reviewed  01/09/2022  - Note at rest 02 sats  97% on RA   General appearance:    amb somber w nad     HEENT : Oropharynx  clear   Nasal turbinates nl    NECK :  without  apparent JVD/ palpable Nodes/TM    LUNGS: no acc muscle use,  Mild barrel  contour chest wall with bilateral  Distant bs s audible wheeze and  without cough on insp or exp maneuvers  and mild  Hyperresonant  to  percussion bilaterally     CV:  RRR  no s3 or murmur or increase in P2, and no edema   ABD:  soft and nontender with pos end  insp Hoover's  in the supine  position.  No bruits or organomegaly appreciated   MS:  Nl gait/ ext warm without deformities Or obvious joint restrictions  calf tenderness, cyanosis or clubbing     SKIN: warm and dry without lesions    NEURO:  alert, approp, nl sensorium with  no motor or cerebellar deficits apparent.

## 2022-01-09 NOTE — Patient Instructions (Addendum)
Plan A = Automatic = Always=    Symbicort 160  Take 2 puffs first thing in am and then another 2 puffs about 12 hours later.    Work on inhaler technique:  relax and gently blow all the way out then take a nice smooth full deep breath back in, triggering the inhaler at same time you start breathing in.  Hold breath in for at least  5 seconds if you can. Blow out symbicort  thru nose. Rinse and gargle with water when done.  If mouth or throat bother you at all,  try brushing teeth/gums/tongue with arm and hammer toothpaste/ make a slurry and gargle and spit out.      Plan B = Backup (to supplement plan A, not to replace it) Only use your albuterol inhaler as a rescue medication to be used if you can't catch your breath by resting or doing a relaxed purse lip breathing pattern.  - The less you use it, the better it will work when you need it. - Ok to use the inhaler up to 2 puffs  every 4 hours if you must but call for appointment if use goes up over your usual need - Don't leave home without it !!  (think of it like the spare tire for your car)   Plan C = Crisis (instead of Plan B but only if Plan B stops working) - only use your albuterol nebulizer if you first try Plan B and it fails to help > ok to use the nebulizer up to every 4 hours but if start needing it regularly call for immediate appointment    Also  Ok to try albuterol 15 min before an activity (on alternating days)  that you know would usually make you short of breath and see if it makes any difference and if makes none then don't take albuterol after activity unless you can't catch your breath as this means it's the resting that helps, not the albuterol.      Please schedule a follow up visit in 3 months but call sooner if needed  with all medications /inhalers/ solutions in hand so we can verify exactly what you are taking. This includes all medications from all doctors and over the counters

## 2022-01-10 ENCOUNTER — Encounter: Payer: Self-pay | Admitting: Internal Medicine

## 2022-01-10 NOTE — Assessment & Plan Note (Addendum)
Informed at [facility] asthma center severe gerd likely the cause with large Rew on cxr but "turned surgery down" PFT's  07/30/11   FEV1  1.25 (61 % ) ratio 0.47  p 6 % improvement from saba p ? prior to study with DLCO  94 % and  corrects to 141  % for alv volume c classic f/v loop for  chronic asthma    - FENO 03/07/2016  =   9 on symb 160 2bid during acute flare - 07/31/2018     75% > continue symb 160 2bid  - 11/05/2018  After extensive coaching inhaler device,  effectiveness =    75% (short ti)  - 11/05/18  Alpha one AT :   MM   Level 179  - 02/03/2019  After extensive coaching inhaler device,  effectiveness =    75% (short Ti) > rec trial of breztri 2bid > some better 05/12/2019 so continue - 01/05/2020  After extensive coaching inhaler device,  effectiveness =    75% > rechallenge with breztri > ? Urinary retention //uti  > d/c'd 05/24/2020 and restarted symb 160 2bid > better but then breathing worse so restarted breztri - 09/18/2021  After extensive coaching inhaler device,  effectiveness =    75% from a baseline of < 50% (short ti)  - 10/03/2021   continue symbicort 160 as bladder problems on breztri  - 01/09/2022  After extensive coaching inhaler device,  effectiveness =  80%   Mod severe chronic asthma also on pred for PMR still not understanding maint vs prn rx or the ABC action plan / reviewed as per AVS- namely Re SABA :  I spent extra time with pt today reviewing appropriate use of albuterol for prn use on exertion with the following points: 1) saba is for relief of sob that does not improve by walking a slower pace or resting but rather if the pt does not improve after trying this first. 2) If the pt is convinced, as many are, that saba helps recover from activity faster then it's easy to tell if this is the case by re-challenging : ie stop, take the inhaler, then p 5 minutes try the exact same activity (intensity of workload) that just caused the symptoms and see if they are substantially  diminished or not after saba 3) if there is an activity that reproducibly causes the symptoms, try the saba 15 min before the activity on alternate days   If in fact the saba really does help, then fine to continue to use it prn but advised may need to look closer at the maintenance regimen being used to achieve better control of airways disease with exertion.   F/u q 3 m with all meds in hand using a trust but verify approach to confirm accurate Medication  Reconciliation The principal here is that until we are certain that the  patients are doing what we've asked, it makes no sense to ask them to do more.           Each maintenance medication was reviewed in detail including emphasizing most importantly the difference between maintenance and prns and under what circumstances the prns are to be triggered using an action plan format where appropriate.  Total time for H and P, chart review, counseling, reviewing hfa  device(s) and generating customized AVS unique to this office visit / same day charting = 23 min

## 2022-02-07 ENCOUNTER — Ambulatory Visit: Payer: Medicare Other | Admitting: Cardiovascular Disease

## 2022-02-08 ENCOUNTER — Other Ambulatory Visit: Payer: Self-pay | Admitting: Internal Medicine

## 2022-02-08 DIAGNOSIS — M353 Polymyalgia rheumatica: Secondary | ICD-10-CM | POA: Diagnosis not present

## 2022-02-08 DIAGNOSIS — Z6826 Body mass index (BMI) 26.0-26.9, adult: Secondary | ICD-10-CM | POA: Diagnosis not present

## 2022-02-08 DIAGNOSIS — E663 Overweight: Secondary | ICD-10-CM | POA: Diagnosis not present

## 2022-02-08 DIAGNOSIS — Z7952 Long term (current) use of systemic steroids: Secondary | ICD-10-CM | POA: Diagnosis not present

## 2022-02-08 DIAGNOSIS — M316 Other giant cell arteritis: Secondary | ICD-10-CM | POA: Diagnosis not present

## 2022-02-08 DIAGNOSIS — M1120 Other chondrocalcinosis, unspecified site: Secondary | ICD-10-CM | POA: Diagnosis not present

## 2022-02-08 DIAGNOSIS — M797 Fibromyalgia: Secondary | ICD-10-CM | POA: Diagnosis not present

## 2022-02-08 NOTE — Telephone Encounter (Signed)
Refill was sent on 01/09/22 with 11 additional refills

## 2022-03-01 DIAGNOSIS — Z79899 Other long term (current) drug therapy: Secondary | ICD-10-CM | POA: Diagnosis not present

## 2022-03-01 DIAGNOSIS — D693 Immune thrombocytopenic purpura: Secondary | ICD-10-CM | POA: Diagnosis not present

## 2022-03-01 DIAGNOSIS — E039 Hypothyroidism, unspecified: Secondary | ICD-10-CM | POA: Diagnosis not present

## 2022-03-01 DIAGNOSIS — I7300 Raynaud's syndrome without gangrene: Secondary | ICD-10-CM | POA: Diagnosis not present

## 2022-03-01 DIAGNOSIS — M353 Polymyalgia rheumatica: Secondary | ICD-10-CM | POA: Diagnosis not present

## 2022-03-08 ENCOUNTER — Telehealth: Payer: Self-pay | Admitting: Internal Medicine

## 2022-03-08 DIAGNOSIS — J4540 Moderate persistent asthma, uncomplicated: Secondary | ICD-10-CM

## 2022-03-08 NOTE — Telephone Encounter (Signed)
Called patient and she states that she is needing new supplies for her nebulizer machine. She states that her current tubing is turning yellow and is needing new supplies. Verified that she currently uses [facility] as her DME. New order placed. Nothing further needed

## 2022-03-08 NOTE — Telephone Encounter (Signed)
Patient called to request a new script for her nebulizer tubing and the cup that holds the solution.  Please advise.  CB# [phone]

## 2022-03-10 ENCOUNTER — Emergency Department (HOSPITAL_COMMUNITY): Payer: Medicare Other

## 2022-03-10 ENCOUNTER — Inpatient Hospital Stay (HOSPITAL_COMMUNITY)
Admission: EM | Admit: 2022-03-10 | Discharge: 2022-03-16 | DRG: 202 | Disposition: A | Discharge status: Home / Self Care | Payer: Medicare Other | Attending: Student | Admitting: Student

## 2022-03-10 DIAGNOSIS — Z7983 Long term (current) use of bisphosphonates: Secondary | ICD-10-CM | POA: Diagnosis not present

## 2022-03-10 DIAGNOSIS — J4489 Other specified chronic obstructive pulmonary disease: Secondary | ICD-10-CM | POA: Diagnosis not present

## 2022-03-10 DIAGNOSIS — Z800 Family history of malignant neoplasm of digestive organs: Secondary | ICD-10-CM

## 2022-03-10 DIAGNOSIS — Z853 Personal history of malignant neoplasm of breast: Secondary | ICD-10-CM

## 2022-03-10 DIAGNOSIS — Z801 Family history of malignant neoplasm of trachea, bronchus and lung: Secondary | ICD-10-CM

## 2022-03-10 DIAGNOSIS — Z885 Allergy status to narcotic agent status: Secondary | ICD-10-CM | POA: Diagnosis not present

## 2022-03-10 DIAGNOSIS — I2510 Atherosclerotic heart disease of native coronary artery without angina pectoris: Secondary | ICD-10-CM

## 2022-03-10 DIAGNOSIS — J210 Acute bronchiolitis due to respiratory syncytial virus: Secondary | ICD-10-CM | POA: Diagnosis not present

## 2022-03-10 DIAGNOSIS — R110 Nausea: Secondary | ICD-10-CM | POA: Diagnosis not present

## 2022-03-10 DIAGNOSIS — Z91041 Radiographic dye allergy status: Secondary | ICD-10-CM

## 2022-03-10 DIAGNOSIS — R7989 Other specified abnormal findings of blood chemistry: Secondary | ICD-10-CM | POA: Diagnosis not present

## 2022-03-10 DIAGNOSIS — Z808 Family history of malignant neoplasm of other organs or systems: Secondary | ICD-10-CM

## 2022-03-10 DIAGNOSIS — K219 Gastro-esophageal reflux disease without esophagitis: Secondary | ICD-10-CM | POA: Diagnosis present

## 2022-03-10 DIAGNOSIS — J4541 Moderate persistent asthma with (acute) exacerbation: Secondary | ICD-10-CM | POA: Diagnosis not present

## 2022-03-10 DIAGNOSIS — Z91011 Allergy to milk products: Secondary | ICD-10-CM

## 2022-03-10 DIAGNOSIS — Z79899 Other long term (current) drug therapy: Secondary | ICD-10-CM

## 2022-03-10 DIAGNOSIS — Z1152 Encounter for screening for COVID-19: Secondary | ICD-10-CM | POA: Diagnosis not present

## 2022-03-10 DIAGNOSIS — Z91040 Latex allergy status: Secondary | ICD-10-CM | POA: Diagnosis not present

## 2022-03-10 DIAGNOSIS — I214 Non-ST elevation (NSTEMI) myocardial infarction: Secondary | ICD-10-CM | POA: Diagnosis not present

## 2022-03-10 DIAGNOSIS — J440 Chronic obstructive pulmonary disease with acute lower respiratory infection: Secondary | ICD-10-CM | POA: Diagnosis present

## 2022-03-10 DIAGNOSIS — I10 Essential (primary) hypertension: Secondary | ICD-10-CM | POA: Diagnosis present

## 2022-03-10 DIAGNOSIS — Z66 Do not resuscitate: Secondary | ICD-10-CM | POA: Diagnosis present

## 2022-03-10 DIAGNOSIS — J45901 Unspecified asthma with (acute) exacerbation: Secondary | ICD-10-CM | POA: Diagnosis present

## 2022-03-10 DIAGNOSIS — J4551 Severe persistent asthma with (acute) exacerbation: Secondary | ICD-10-CM | POA: Diagnosis not present

## 2022-03-10 DIAGNOSIS — Z85820 Personal history of malignant melanoma of skin: Secondary | ICD-10-CM | POA: Diagnosis not present

## 2022-03-10 DIAGNOSIS — Z86006 Personal history of melanoma in-situ: Secondary | ICD-10-CM

## 2022-03-10 DIAGNOSIS — Z806 Family history of leukemia: Secondary | ICD-10-CM

## 2022-03-10 DIAGNOSIS — J121 Respiratory syncytial virus pneumonia: Secondary | ICD-10-CM | POA: Diagnosis not present

## 2022-03-10 DIAGNOSIS — K449 Diaphragmatic hernia without obstruction or gangrene: Secondary | ICD-10-CM | POA: Diagnosis not present

## 2022-03-10 DIAGNOSIS — Z8249 Family history of ischemic heart disease and other diseases of the circulatory system: Secondary | ICD-10-CM

## 2022-03-10 DIAGNOSIS — C50411 Malignant neoplasm of upper-outer quadrant of right female breast: Secondary | ICD-10-CM | POA: Diagnosis not present

## 2022-03-10 DIAGNOSIS — Z170 Estrogen receptor positive status [ER+]: Secondary | ICD-10-CM

## 2022-03-10 DIAGNOSIS — R0902 Hypoxemia: Secondary | ICD-10-CM | POA: Diagnosis not present

## 2022-03-10 DIAGNOSIS — J441 Chronic obstructive pulmonary disease with (acute) exacerbation: Secondary | ICD-10-CM | POA: Diagnosis not present

## 2022-03-10 DIAGNOSIS — Z825 Family history of asthma and other chronic lower respiratory diseases: Secondary | ICD-10-CM

## 2022-03-10 DIAGNOSIS — R0602 Shortness of breath: Secondary | ICD-10-CM | POA: Diagnosis not present

## 2022-03-10 DIAGNOSIS — Z7982 Long term (current) use of aspirin: Secondary | ICD-10-CM

## 2022-03-10 DIAGNOSIS — M315 Giant cell arteritis with polymyalgia rheumatica: Secondary | ICD-10-CM | POA: Diagnosis not present

## 2022-03-10 DIAGNOSIS — Z8049 Family history of malignant neoplasm of other genital organs: Secondary | ICD-10-CM

## 2022-03-10 DIAGNOSIS — E063 Autoimmune thyroiditis: Secondary | ICD-10-CM | POA: Diagnosis not present

## 2022-03-10 DIAGNOSIS — R000 Tachycardia, unspecified: Secondary | ICD-10-CM | POA: Diagnosis not present

## 2022-03-10 DIAGNOSIS — E785 Hyperlipidemia, unspecified: Secondary | ICD-10-CM | POA: Diagnosis not present

## 2022-03-10 DIAGNOSIS — Z888 Allergy status to other drugs, medicaments and biological substances status: Secondary | ICD-10-CM

## 2022-03-10 DIAGNOSIS — Z9861 Coronary angioplasty status: Secondary | ICD-10-CM | POA: Diagnosis not present

## 2022-03-10 DIAGNOSIS — M797 Fibromyalgia: Secondary | ICD-10-CM | POA: Diagnosis present

## 2022-03-10 DIAGNOSIS — E039 Hypothyroidism, unspecified: Secondary | ICD-10-CM | POA: Diagnosis present

## 2022-03-10 DIAGNOSIS — Z7952 Long term (current) use of systemic steroids: Secondary | ICD-10-CM

## 2022-03-10 DIAGNOSIS — Z955 Presence of coronary angioplasty implant and graft: Secondary | ICD-10-CM | POA: Diagnosis not present

## 2022-03-10 DIAGNOSIS — Z79890 Hormone replacement therapy (postmenopausal): Secondary | ICD-10-CM

## 2022-03-10 DIAGNOSIS — Z841 Family history of disorders of kidney and ureter: Secondary | ICD-10-CM

## 2022-03-10 DIAGNOSIS — Z7951 Long term (current) use of inhaled steroids: Secondary | ICD-10-CM

## 2022-03-10 DIAGNOSIS — Z820 Family history of epilepsy and other diseases of the nervous system: Secondary | ICD-10-CM

## 2022-03-10 DIAGNOSIS — Z803 Family history of malignant neoplasm of breast: Secondary | ICD-10-CM

## 2022-03-10 LAB — CBC
HCT: 44.3 % (ref 36.0–46.0)
Hemoglobin: 14.6 g/dL (ref 12.0–15.0)
MCH: 31.1 pg (ref 26.0–34.0)
MCHC: 33 g/dL (ref 30.0–36.0)
MCV: 94.3 fL (ref 80.0–100.0)
Platelets: 156 10*3/uL (ref 150–400)
RBC: 4.7 MIL/uL (ref 3.87–5.11)
RDW: 13.4 % (ref 11.5–15.5)
WBC: 9 10*3/uL (ref 4.0–10.5)
nRBC: 0 % (ref 0.0–0.2)

## 2022-03-10 NOTE — ED Provider Notes (Incomplete)
[facility] EMERGENCY DEPARTMENT Provider Note  CSN: [id number] Arrival date & time: 03/10/22 2259  Chief Complaint(s) Shortness of Breath  HPI Sonia [name] is a 81 y.o. female ***   Past Medical History Past Medical History:  Diagnosis Date   Arthritis    Asthma    daily and prn inhalers; states good control currently   Breast cancer ([place])    right   CAD S/P percutaneous coronary angioplasty 07/15/2016   COPD (chronic obstructive pulmonary disease) (HCC)    Dyspnea    occ with exertion   Fibromyalgia    GERD (gastroesophageal reflux disease)    no current med.   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   Vitamin D deficiency    Patient Active Problem List   Diagnosis Date Noted   Coronary artery disease of native artery of native heart with stable angina pectoris ([place])    Asthma, chronic, moderate persistent, uncomplicated 93/26/7124   DOE (dyspnea on exertion) 07/31/2018   Osteopenia 06/04/2018   Carcinoma of upper-outer quadrant of right breast in female, estrogen receptor positive ([place]) 05/30/2018   GI bleed 07/25/2016   Melena 07/24/2016   Acute renal insufficiency 07/24/2016   CAD S/P percutaneous coronary angioplasty 07/16/2016   NSTEMI (non-ST elevated myocardial infarction) ([place]) 07/13/2016   Severe asthma with acute exacerbation 03/07/2016   Genetic testing 06/09/2015   Family history of breast cancer in female 05/10/2015   History of melanoma 05/10/2015   Atypical lobular hyperplasia of left breast 04/26/2015   Vision problem 01/08/2014   Hiatus hernia syndrome 08/01/2012   Palpitations  10/18/2010   Acute chest pain 10/18/2010   Hypothyroidism 10/18/2010   Dyslipidemia 06/07/2009   Upper airway cough syndrome 03/29/2009   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 Authorizing Provider  albuterol (PROVENTIL) (2.5 MG/3ML) 0.083% nebulizer solution Take 3 mLs (2.5 mg total) by nebulization every 6 (six) hours as needed for wheezing or shortness of breath. Patient taking differently: Take 2.5 mg by nebulization every 6 (six) hours as needed for wheezing or shortness of breath. Use 1 nebulizer (2.5 mg) (scheduled) in the morning & may use every 6 hours as needed for wheezing/shortness of breath. 05/29/21   Tanda Rockers, MD  albuterol (VENTOLIN HFA) 108 (90 Base) MCG/ACT inhaler 2 puffs every 4 hours if needed 01/09/22   Tanda Rockers, MD  calcium carbonate (TUMS - DOSED IN MG ELEMENTAL CALCIUM) 500 MG chewable tablet Chew 500 mg by mouth daily as needed for indigestion or heartburn.    [provider]  cetirizine (ZYRTEC) 10 MG tablet Take 10 mg by mouth in the morning.    [provider]  Cholecalciferol 25 MCG (1000 UT) capsule Take 1,000 Units by mouth every other day.    [provider]  Cyanocobalamin (B-12) 5000 MCG CAPS Place 5,000 mcg under the tongue every other day.    [provider]  famotidine (PEPCID) 20 MG tablet One after supper 09/18/21  Tanda Rockers, MD  levothyroxine (SYNTHROID) 125 MCG tablet Take 125 mcg by mouth daily before breakfast.    [provider]  montelukast (SINGULAIR) 10 MG tablet Take 1 tablet (10 mg total) by mouth at bedtime. 06/02/21   Parrett, Fonnie Mu, NP  nitroGLYCERIN (NITROSTAT) 0.4 MG SL tablet Place 0.4 mg under the tongue every 5 (five) minutes x 3 doses as needed for chest pain.    [provider]  pantoprazole (PROTONIX) 40 MG tablet Take 1 tablet (40 mg total) by mouth  daily. Take 30-60 min before first meal of the day 09/18/21   Tanda Rockers, MD  predniSONE (DELTASONE) 20 MG tablet Take 1 tablet (20 mg total) by mouth daily with breakfast. Patient taking differently: Take 20-40 mg by mouth See admin instructions. Take 2 tablets (40 mg) by mouth in the morning & take 1 tablet (20 mg) by mouth in the evening. 07/13/21   Parrett, Fonnie Mu, NP  SYMBICORT 160-4.5 MCG/ACT inhaler Inhale 2 puffs into the lungs in the morning and at bedtime. 12/22/21   Tanda Rockers, MD                                                                                                                                    Past Surgical History Past Surgical History:  Procedure Laterality Date   ABDOMINAL HYSTERECTOMY     partial   APPENDECTOMY     BREAST BIOPSY Left    BREAST CYST EXCISION Left    BREAST EXCISIONAL BIOPSY Left 03/2015   BREAST LUMPECTOMY Right 08/2018   BREAST LUMPECTOMY WITH RADIOACTIVE SEED LOCALIZATION Left 04/07/2015   Procedure: BREAST LUMPECTOMY WITH RADIOACTIVE SEED LOCALIZATION;  Surgeon: Erroll Luna, MD;  Location: [facility];  Service: General;  Laterality: Left;   BREAST LUMPECTOMY WITH RADIOACTIVE SEED LOCALIZATION Right 08/13/2018   Procedure: RIGHT BREAST LUMPECTOMY WITH RADIOACTIVE SEED LOCALIZATION;  Surgeon: Rolm Bookbinder, MD;  Location: [facility];  Service: General;  Laterality: Right;   COLONOSCOPY     CORONARY STENT INTERVENTION N/A 07/15/2016   Procedure: Coronary Stent Intervention;  Surgeon: Sherren Mocha, MD;  Location: [facility];  Service: Cardiovascular;  Laterality: N/A;   ESOPHAGOGASTRODUODENOSCOPY (EGD) WITH PROPOFOL Left 07/25/2016   Procedure: ESOPHAGOGASTRODUODENOSCOPY (EGD) WITH PROPOFOL;  Surgeon: Ronnette Juniper, MD;  Location: [facility];  Service: Gastroenterology;  Laterality: Left;   KNEE ARTHROSCOPY Right 12/07/2003   LAPAROSCOPIC SALPINGO OOPHERECTOMY Right 09/08/2003   LEFT HEART  CATH N/A 07/15/2016   Procedure: Left Heart Cath;  Surgeon: Sherren Mocha, MD;  Location: [facility];  Service: Cardiovascular;  Laterality: N/A;   LEFT HEART CATH AND CORONARY ANGIOGRAPHY N/A 04/14/2019   Procedure: LEFT HEART CATH AND CORONARY ANGIOGRAPHY;  Surgeon: Burnell Blanks, MD;  Location: [facility];  Service: Cardiovascular;  Laterality: N/A;   LYSIS OF ADHESION  09/08/2003   NASAL  SINUS SURGERY  age 84   RECTAL POLYPECTOMY  10/02/2006   Family History Family History  Problem Relation Age of Onset   Asthma Mother    Heart disease Mother    Asthma Sister    Heart disease Sister    Breast cancer Sister 37   Heart disease Father    Kidney disease Father    Prostate cancer Brother        dx. 45s   Heart attack Brother 86   Alzheimer's disease Brother 28   Breast cancer Maternal Aunt 70   Stomach cancer Maternal Grandfather 45   Asthma Sister    Memory loss Sister    Heart attack Son 10   Spinal muscular atrophy Grandchild        type II   Cancer Other 34       niece dx. ca of salivary gland    Breast cancer Other 72       niece; negative genetic testing 10 years ago   Melanoma Other    Alzheimer's disease Maternal Aunt    Cervical cancer Cousin        maternal 1st cousin dx. at 12   Breast cancer Cousin 73       maternal 1st cousin   Lung cancer Cousin 69       maternal 1st cousin; smoker   Heart disease Cousin    Leukemia Cousin 14       paternal 1st cousin   Breast cancer Other        paternal great grandmother (PGF's mother) dx. late 48s-early 36s   Coronary artery disease Other     Social History Social History   Tobacco Use   Smoking status: Never    Passive exposure: Past   Smokeless tobacco: Never  Vaping Use   Vaping Use: Never used  Substance Use Topics   Alcohol use: Not Currently    Comment: rarely   Drug use: No   Allergies Adenosine, Contrast media [iodinated contrast media], Influenza virus vaccine,  Nitrofurantoin, Milk (cow), Breztri aerosphere [budeson-glycopyrrol-formoterol], Codeine sulfate, and Latex  Review of Systems Review of Systems *** Physical Exam Vital Signs  I have reviewed the triage vital signs SpO2 96%  *** Physical Exam  ED Results and Treatments Labs (all labs ordered are listed, but only abnormal results are displayed) Labs Reviewed - No data to display                                                                                                                        Radiology No results found.  Pertinent labs & imaging results that were available during my care of the patient were reviewed by me and considered in my medical decision making (see MDM for details).  Medications Ordered in ED Medications - No data to display  Procedures Procedures  (including critical care time)  Medical Decision Making / ED Course   This patient presents to the ED for concern of ***, this involves an extensive number of treatment options, and is a complaint that carries with it a high risk of complications and morbidity.  The differential diagnosis includes ***  MDM: ***   Additional history obtained: -Additional history obtained from *** -External records from outside source obtained and reviewed including: Chart review including previous notes, labs, imaging, consultation notes   Lab Tests: -I ordered, reviewed, and interpreted labs.   The pertinent results include:   Labs Reviewed - No data to display    EKG ***  EKG Interpretation  Date/Time:    Ventricular Rate:    PR Interval:    QRS Duration:   QT Interval:    QTC Calculation:   R Axis:     Text Interpretation:           Imaging Studies ordered: I ordered imaging studies including *** I independently visualized and interpreted imaging. I agree with the  radiologist interpretation   Medicines ordered and prescription drug management: No orders of the defined types were placed in this encounter.   -I have reviewed the patients home medicines and have made adjustments as needed  Critical interventions ***  Consultations Obtained: I requested consultation with the ***,  and discussed lab and imaging findings as well as pertinent plan - they recommend: ***   Cardiac Monitoring: The patient was maintained on a cardiac monitor.  I personally viewed and interpreted the cardiac monitored which showed an underlying rhythm of: ***  Social Determinants of Health:  Factors impacting patients care include: ***   Reevaluation: After the interventions noted above, I reevaluated the patient and found that they have :{resolved/improved/worsened:23923::"improved"}  Co morbidities that complicate the patient evaluation  Past Medical History:  Diagnosis Date   Arthritis    Asthma    daily and prn inhalers; states good control currently   Breast cancer ([place])    right   CAD S/P percutaneous coronary angioplasty 07/15/2016   COPD (chronic obstructive pulmonary disease) (HCC)    Dyspnea    occ with exertion   Fibromyalgia    GERD (gastroesophageal reflux disease)    no current med.   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

## 2022-03-10 NOTE — ED Triage Notes (Signed)
C/O SOB satting in low 90's. Hx asthma/COPD

## 2022-03-11 ENCOUNTER — Inpatient Hospital Stay (HOSPITAL_COMMUNITY): Payer: Medicare Other

## 2022-03-11 DIAGNOSIS — Z1152 Encounter for screening for COVID-19: Secondary | ICD-10-CM | POA: Diagnosis not present

## 2022-03-11 DIAGNOSIS — R7989 Other specified abnormal findings of blood chemistry: Secondary | ICD-10-CM | POA: Diagnosis not present

## 2022-03-11 DIAGNOSIS — Z9861 Coronary angioplasty status: Secondary | ICD-10-CM

## 2022-03-11 DIAGNOSIS — M315 Giant cell arteritis with polymyalgia rheumatica: Secondary | ICD-10-CM | POA: Diagnosis present

## 2022-03-11 DIAGNOSIS — I214 Non-ST elevation (NSTEMI) myocardial infarction: Secondary | ICD-10-CM | POA: Diagnosis present

## 2022-03-11 DIAGNOSIS — J45901 Unspecified asthma with (acute) exacerbation: Secondary | ICD-10-CM | POA: Diagnosis present

## 2022-03-11 DIAGNOSIS — Z888 Allergy status to other drugs, medicaments and biological substances status: Secondary | ICD-10-CM | POA: Diagnosis not present

## 2022-03-11 DIAGNOSIS — Z66 Do not resuscitate: Secondary | ICD-10-CM | POA: Diagnosis present

## 2022-03-11 DIAGNOSIS — Z91041 Radiographic dye allergy status: Secondary | ICD-10-CM | POA: Diagnosis not present

## 2022-03-11 DIAGNOSIS — Z79899 Other long term (current) drug therapy: Secondary | ICD-10-CM | POA: Diagnosis not present

## 2022-03-11 DIAGNOSIS — I2510 Atherosclerotic heart disease of native coronary artery without angina pectoris: Secondary | ICD-10-CM | POA: Diagnosis present

## 2022-03-11 DIAGNOSIS — Z885 Allergy status to narcotic agent status: Secondary | ICD-10-CM | POA: Diagnosis not present

## 2022-03-11 DIAGNOSIS — J210 Acute bronchiolitis due to respiratory syncytial virus: Secondary | ICD-10-CM | POA: Diagnosis present

## 2022-03-11 DIAGNOSIS — E039 Hypothyroidism, unspecified: Secondary | ICD-10-CM

## 2022-03-11 DIAGNOSIS — J121 Respiratory syncytial virus pneumonia: Secondary | ICD-10-CM | POA: Diagnosis present

## 2022-03-11 DIAGNOSIS — J4541 Moderate persistent asthma with (acute) exacerbation: Secondary | ICD-10-CM | POA: Diagnosis not present

## 2022-03-11 DIAGNOSIS — Z85820 Personal history of malignant melanoma of skin: Secondary | ICD-10-CM | POA: Diagnosis not present

## 2022-03-11 DIAGNOSIS — Z7952 Long term (current) use of systemic steroids: Secondary | ICD-10-CM | POA: Diagnosis not present

## 2022-03-11 DIAGNOSIS — Z955 Presence of coronary angioplasty implant and graft: Secondary | ICD-10-CM | POA: Diagnosis not present

## 2022-03-11 DIAGNOSIS — R000 Tachycardia, unspecified: Secondary | ICD-10-CM | POA: Diagnosis not present

## 2022-03-11 DIAGNOSIS — J440 Chronic obstructive pulmonary disease with acute lower respiratory infection: Secondary | ICD-10-CM | POA: Diagnosis present

## 2022-03-11 DIAGNOSIS — I10 Essential (primary) hypertension: Secondary | ICD-10-CM | POA: Diagnosis present

## 2022-03-11 DIAGNOSIS — E785 Hyperlipidemia, unspecified: Secondary | ICD-10-CM | POA: Diagnosis present

## 2022-03-11 DIAGNOSIS — C50411 Malignant neoplasm of upper-outer quadrant of right female breast: Secondary | ICD-10-CM | POA: Diagnosis not present

## 2022-03-11 DIAGNOSIS — Z91040 Latex allergy status: Secondary | ICD-10-CM | POA: Diagnosis not present

## 2022-03-11 DIAGNOSIS — Z91011 Allergy to milk products: Secondary | ICD-10-CM | POA: Diagnosis not present

## 2022-03-11 DIAGNOSIS — M797 Fibromyalgia: Secondary | ICD-10-CM | POA: Diagnosis present

## 2022-03-11 DIAGNOSIS — J441 Chronic obstructive pulmonary disease with (acute) exacerbation: Secondary | ICD-10-CM | POA: Diagnosis present

## 2022-03-11 DIAGNOSIS — E063 Autoimmune thyroiditis: Secondary | ICD-10-CM | POA: Diagnosis present

## 2022-03-11 DIAGNOSIS — J4489 Other specified chronic obstructive pulmonary disease: Secondary | ICD-10-CM | POA: Diagnosis not present

## 2022-03-11 DIAGNOSIS — Z7982 Long term (current) use of aspirin: Secondary | ICD-10-CM | POA: Diagnosis not present

## 2022-03-11 DIAGNOSIS — J4551 Severe persistent asthma with (acute) exacerbation: Secondary | ICD-10-CM | POA: Diagnosis not present

## 2022-03-11 DIAGNOSIS — Z7983 Long term (current) use of bisphosphonates: Secondary | ICD-10-CM | POA: Diagnosis not present

## 2022-03-11 LAB — ECHOCARDIOGRAM COMPLETE
Area-P 1/2: 3.63 cm2
Calc EF: 75.4 %
MV VTI: 2.85 cm2
S' Lateral: 2.4 cm
Single Plane A2C EF: 75.4 %
Single Plane A4C EF: 75.1 %

## 2022-03-11 LAB — HEPARIN LEVEL (UNFRACTIONATED)
Heparin Unfractionated: 0.6 IU/mL (ref 0.30–0.70)
Heparin Unfractionated: 0.49 IU/mL (ref 0.30–0.70)

## 2022-03-11 LAB — LIPID PANEL
Cholesterol: 236 mg/dL — ABNORMAL HIGH (ref 0–200)
HDL: 90 mg/dL (ref >40)
LDL Cholesterol: 137 mg/dL — ABNORMAL HIGH (ref 0–99)
Total CHOL/HDL Ratio: 2.6 RATIO
Triglycerides: 46 mg/dL (ref <150)
VLDL: 9 mg/dL (ref 0–40)

## 2022-03-11 LAB — COMPREHENSIVE METABOLIC PANEL
ALT: 14 U/L (ref 0–44)
AST: 25 U/L (ref 15–41)
Albumin: 3.6 g/dL (ref 3.5–5.0)
Alkaline Phosphatase: 52 U/L (ref 38–126)
Anion gap: 9 (ref 5–15)
BUN: 20 mg/dL (ref 8–23)
CO2: 26 mmol/L (ref 22–32)
Calcium: 9.1 mg/dL (ref 8.9–10.3)
Chloride: 107 mmol/L (ref 98–111)
Creatinine, Ser: 1.06 mg/dL — ABNORMAL HIGH (ref 0.44–1.00)
GFR, Estimated: 52 mL/min — ABNORMAL LOW (ref >60)
Glucose, Bld: 131 mg/dL — ABNORMAL HIGH (ref 70–99)
Potassium: 3.7 mmol/L (ref 3.5–5.1)
Sodium: 142 mmol/L (ref 135–145)
Total Bilirubin: 1 mg/dL (ref 0.3–1.2)
Total Protein: 6.4 g/dL — ABNORMAL LOW (ref 6.5–8.1)

## 2022-03-11 LAB — BRAIN NATRIURETIC PEPTIDE: B Natriuretic Peptide: 46.8 pg/mL (ref 0.0–100.0)

## 2022-03-11 LAB — TROPONIN I (HIGH SENSITIVITY)
Troponin I (High Sensitivity): 469 ng/L (ref <18)
Troponin I (High Sensitivity): 1063 ng/L (ref <18)
Troponin I (High Sensitivity): 264 ng/L (ref <18)
Troponin I (High Sensitivity): 403 ng/L (ref <18)

## 2022-03-11 LAB — RESP PANEL BY RT-PCR (RSV, FLU A&B, COVID)  RVPGX2
Influenza A by PCR: NEGATIVE
Influenza B by PCR: NEGATIVE
Resp Syncytial Virus by PCR: POSITIVE — AB
SARS Coronavirus 2 by RT PCR: NEGATIVE

## 2022-03-11 LAB — TSH: TSH: 2.495 u[IU]/mL (ref 0.350–4.500)

## 2022-03-11 MED ORDER — PERFLUTREN LIPID MICROSPHERE
1.0000 mL | INTRAVENOUS | Status: AC | PRN
  Administered 2022-03-11: 1.5 mL via INTRAVENOUS

## 2022-03-11 MED ORDER — LORAZEPAM 2 MG/ML IJ SOLN
0.5000 mg | Freq: Once | INTRAMUSCULAR | Status: AC
  Administered 2022-03-11: 0.5 mg via INTRAVENOUS
  Filled 2022-03-11: qty 1

## 2022-03-11 MED ORDER — IPRATROPIUM-ALBUTEROL 0.5-2.5 (3) MG/3ML IN SOLN
9.0000 mL | Freq: Once | RESPIRATORY_TRACT | Status: AC
  Administered 2022-03-11: 9 mL via RESPIRATORY_TRACT
  Filled 2022-03-11: qty 3

## 2022-03-11 MED ORDER — ALBUTEROL SULFATE (2.5 MG/3ML) 0.083% IN NEBU
10.0000 mg | INHALATION_SOLUTION | Freq: Once | RESPIRATORY_TRACT | Status: AC
  Administered 2022-03-11: 10 mg via RESPIRATORY_TRACT
  Filled 2022-03-11: qty 12

## 2022-03-11 MED ORDER — METHYLPREDNISOLONE SODIUM SUCC 40 MG IJ SOLR: 40.0000 mg | Freq: Two times a day (BID) | INTRAMUSCULAR | Status: DC

## 2022-03-11 MED ORDER — ROSUVASTATIN CALCIUM 5 MG PO TABS
5.0000 mg | ORAL_TABLET | Freq: Every day | ORAL | Status: DC
  Administered 2022-03-11 – 2022-03-16 (×6): 5 mg via ORAL
  Filled 2022-03-11 (×6): qty 1

## 2022-03-11 MED ORDER — ASPIRIN 325 MG PO TABS
325.0000 mg | ORAL_TABLET | Freq: Every day | ORAL | Status: DC
  Administered 2022-03-11: 325 mg via ORAL
  Filled 2022-03-11: qty 1

## 2022-03-11 MED ORDER — METHYLPREDNISOLONE SODIUM SUCC 40 MG IJ SOLR
20.0000 mg | Freq: Two times a day (BID) | INTRAMUSCULAR | Status: DC
  Administered 2022-03-11 – 2022-03-14 (×8): 20 mg via INTRAVENOUS
  Filled 2022-03-11 (×8): qty 1

## 2022-03-11 MED ORDER — ALBUTEROL SULFATE (2.5 MG/3ML) 0.083% IN NEBU
2.5000 mg | INHALATION_SOLUTION | RESPIRATORY_TRACT | Status: DC | PRN
  Administered 2022-03-11 (×2): 2.5 mg via RESPIRATORY_TRACT
  Filled 2022-03-11 (×3): qty 3

## 2022-03-11 MED ORDER — FAMOTIDINE 20 MG PO TABS
20.0000 mg | ORAL_TABLET | Freq: Every day | ORAL | Status: DC
  Administered 2022-03-11 – 2022-03-15 (×5): 20 mg via ORAL
  Filled 2022-03-11 (×5): qty 1

## 2022-03-11 MED ORDER — BENZONATATE 100 MG PO CAPS
100.0000 mg | ORAL_CAPSULE | Freq: Three times a day (TID) | ORAL | Status: DC | PRN
  Administered 2022-03-11 – 2022-03-13 (×4): 100 mg via ORAL
  Filled 2022-03-11 (×6): qty 1

## 2022-03-11 MED ORDER — HEPARIN BOLUS VIA INFUSION
4000.0000 [IU] | Freq: Once | INTRAVENOUS | Status: AC
  Administered 2022-03-11: 4000 [IU] via INTRAVENOUS
  Filled 2022-03-11: qty 4000

## 2022-03-11 MED ORDER — PANTOPRAZOLE SODIUM 40 MG PO TBEC
40.0000 mg | DELAYED_RELEASE_TABLET | Freq: Every day | ORAL | Status: DC
  Administered 2022-03-11 – 2022-03-16 (×6): 40 mg via ORAL
  Filled 2022-03-11 (×6): qty 1

## 2022-03-11 MED ORDER — MONTELUKAST SODIUM 10 MG PO TABS
10.0000 mg | ORAL_TABLET | Freq: Every day | ORAL | Status: DC
  Administered 2022-03-15: 10 mg via ORAL
  Filled 2022-03-11 (×4): qty 1

## 2022-03-11 MED ORDER — LEVOTHYROXINE SODIUM 25 MCG PO TABS
125.0000 ug | ORAL_TABLET | Freq: Every day | ORAL | Status: DC
  Administered 2022-03-11 – 2022-03-16 (×6): 125 ug via ORAL
  Filled 2022-03-11 (×6): qty 1

## 2022-03-11 MED ORDER — IPRATROPIUM-ALBUTEROL 0.5-2.5 (3) MG/3ML IN SOLN: 3.0000 mL | Freq: Once | RESPIRATORY_TRACT | Status: DC

## 2022-03-11 MED ORDER — ASPIRIN 81 MG PO TBEC
81.0000 mg | DELAYED_RELEASE_TABLET | Freq: Every day | ORAL | Status: DC
  Administered 2022-03-12 – 2022-03-16 (×5): 81 mg via ORAL
  Filled 2022-03-11 (×5): qty 1

## 2022-03-11 MED ORDER — IPRATROPIUM-ALBUTEROL 0.5-2.5 (3) MG/3ML IN SOLN
3.0000 mL | RESPIRATORY_TRACT | Status: AC
  Administered 2022-03-11 (×3): 3 mL via RESPIRATORY_TRACT
  Filled 2022-03-11 (×3): qty 3

## 2022-03-11 MED ORDER — SODIUM CHLORIDE 0.9 % IV SOLN
500.0000 mg | Freq: Every day | INTRAVENOUS | Status: DC
  Administered 2022-03-11 – 2022-03-12 (×2): 500 mg via INTRAVENOUS
  Filled 2022-03-11 (×2): qty 5

## 2022-03-11 MED ORDER — HEPARIN (PORCINE) 25000 UT/250ML-% IV SOLN
850.0000 [IU]/h | INTRAVENOUS | Status: DC
  Administered 2022-03-11 – 2022-03-12 (×2): 850 [IU]/h via INTRAVENOUS
  Filled 2022-03-11 (×3): qty 250

## 2022-03-11 MED ORDER — SODIUM CHLORIDE 0.9 % IV SOLN
2.0000 g | INTRAVENOUS | Status: DC
  Administered 2022-03-11 – 2022-03-12 (×2): 2 g via INTRAVENOUS
  Filled 2022-03-11 (×2): qty 20

## 2022-03-11 NOTE — Consult Note (Signed)
Cardiology Consultation   Patient ID: Sonia [name] MRN: [id number]; DOB: 1940/10/22  Admit date: 03/10/2022 Date of Consult: 03/11/2022  PCP:  Glenis Smoker, MD   [facility] Cardiologist:  [name], MD     Patient Profile:   Sonia [name] is a 81 y.o. female with a hx of CAD s/p PCI RCA, COPD, asthma, GERD, hypothyroidism, breast CA who is being seen 03/11/2022 for the evaluation of elevated troponin at the request of Dr. Marthenia Rolling.  History of Present Illness:   Ms. [name] is an 81 year old female with past medical history noted above.  She has been followed by Dr. Angelena Form as an outpatient.  She was admitted 07/2016 with left arm pain and found to have an NSTEMI.  Underwent cardiac catheterization with severe stenosis of the mid RCA treated with PCI/DES.  Have moderate stenosis of the mid LAD which was treated medically.  LVEF was normal with mild valvular disease.  She was discharged on DAPT with aspirin and Brilinta but unfortunately readmitted 1 week later with a GI bleed.  EGD showed gastritis.  She was changed from Brilinta to Plavix with bleeding resolved.  Has a history of statin intolerances.  He was found to have breast cancer in 2020 and underwent a lumpectomy.  Had recurrent left arm pain in 2021 and underwent cardiac catheterization 04/2019 which showed nonobstructive disease in the LAD and circumflex with patent stent in the RCA with minimal restenosis.  LV function was normal.  She was last seen in the office on 06/2019 with Dr. Angelena Form and reported being in her usual state of health.  She was continued on aspirin and low-dose Crestor 5 mg 2 days/week as she only tolerates low-dose statin.  Reported over Christmas eve she was exposed to sick contacts in her family.  She shortly afterwards developed congestion, wheezing sinus drainage and infection.  She was started on amoxicillin but unfortunately symptoms continue  to worsen.  She presented to the ED on 12/30 with complaints of worsening shortness of breath, cough and congestion.  In the ED her labs showed sodium 142, potassium 3.7, creatinine 1.06, BNP 46, high-sensitivity troponin 469>> 1063, WBC 9, hemoglobin 14.6.  Chest x-ray with no acute finding.  Respiratory panel positive for RSV.  She was admitted to internal medicine for COPD exacerbation as well as RSV with possible pneumonia.  Cardiology asked to evaluate in regards to elevated troponin.  In talking with patient she reports she has had some left arm pain and numbness intermittently over the past several weeks.  Does report this is somewhat similar to what she experienced with prior anginal episodes, though not as significant.  Past Medical History:  Diagnosis Date   Arthritis    Asthma    daily and prn inhalers; states good control currently   Breast cancer ([facility])    right   CAD S/P percutaneous coronary angioplasty 07/15/2016   COPD (chronic obstructive pulmonary disease) (HCC)    Dyspnea    occ with exertion   Fibromyalgia    GERD (gastroesophageal reflux disease)    no current med.   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   Vitamin D deficiency     Past Surgical History:  Procedure Laterality Date   ABDOMINAL HYSTERECTOMY     partial   APPENDECTOMY     BREAST BIOPSY Left    BREAST CYST EXCISION Left    BREAST EXCISIONAL BIOPSY Left 03/2015   BREAST LUMPECTOMY Right 08/2018   BREAST LUMPECTOMY WITH RADIOACTIVE SEED LOCALIZATION Left 04/07/2015   Procedure: BREAST LUMPECTOMY WITH RADIOACTIVE SEED LOCALIZATION;  Surgeon: Erroll Luna, MD;   Location: [facility];  Service: General;  Laterality: Left;   BREAST LUMPECTOMY WITH RADIOACTIVE SEED LOCALIZATION Right 08/13/2018   Procedure: RIGHT BREAST LUMPECTOMY WITH RADIOACTIVE SEED LOCALIZATION;  Surgeon: Rolm Bookbinder, MD;  Location: [facility];  Service: General;  Laterality: Right;   COLONOSCOPY     CORONARY STENT INTERVENTION N/A 07/15/2016   Procedure: Coronary Stent Intervention;  Surgeon: Sherren Mocha, MD;  Location: [facility];  Service: Cardiovascular;  Laterality: N/A;   ESOPHAGOGASTRODUODENOSCOPY (EGD) WITH PROPOFOL Left 07/25/2016   Procedure: ESOPHAGOGASTRODUODENOSCOPY (EGD) WITH PROPOFOL;  Surgeon: Ronnette Juniper, MD;  Location: [facility];  Service: Gastroenterology;  Laterality: Left;   KNEE ARTHROSCOPY Right 12/07/2003   LAPAROSCOPIC SALPINGO OOPHERECTOMY Right 09/08/2003   LEFT HEART CATH N/A 07/15/2016   Procedure: Left Heart Cath;  Surgeon: Sherren Mocha, MD;  Location: [facility];  Service: Cardiovascular;  Laterality: N/A;   LEFT HEART CATH AND CORONARY ANGIOGRAPHY N/A 04/14/2019   Procedure: LEFT HEART CATH AND CORONARY ANGIOGRAPHY;  Surgeon: Burnell Blanks, MD;  Location: [facility];  Service: Cardiovascular;  Laterality: N/A;   LYSIS OF ADHESION  09/08/2003   NASAL SINUS SURGERY  age 67   RECTAL POLYPECTOMY  10/02/2006     Home Medications:  Prior to Admission medications   Medication Sig Start Date End Date Taking? Authorizing Provider  albuterol (PROVENTIL) (2.5 MG/3ML) 0.083% nebulizer solution Take 3 mLs (2.5 mg total) by nebulization every 6 (six) hours as needed for wheezing or shortness of breath. Patient taking differently: Take 2.5 mg by nebulization every 6 (six) hours as needed for wheezing or shortness of breath. Use 1 nebulizer (2.5 mg) (scheduled) in the morning & may use every 6 hours as needed for wheezing/shortness of breath. 05/29/21   Tanda Rockers, MD  albuterol  (VENTOLIN HFA) 108 (90 Base) MCG/ACT inhaler 2 puffs every 4 hours if needed 01/09/22   Tanda Rockers, MD  calcium carbonate (TUMS - DOSED IN MG ELEMENTAL CALCIUM) 500 MG chewable tablet Chew 500 mg by mouth daily as needed for indigestion or heartburn.    [provider]  cetirizine (ZYRTEC) 10 MG tablet Take 10 mg by mouth in the morning.    [provider]  Cholecalciferol 25 MCG (1000 UT) capsule Take 1,000 Units by mouth every other day.    [provider]  Cyanocobalamin (B-12) 5000 MCG CAPS Place 5,000 mcg under the tongue every other day.    [provider]  famotidine (PEPCID) 20 MG tablet One after supper 09/18/21   Tanda Rockers, MD  levothyroxine (SYNTHROID) 125 MCG tablet Take 125 mcg by mouth daily before breakfast.    [provider]  montelukast (SINGULAIR) 10 MG tablet Take 1 tablet (10 mg total) by mouth at bedtime. 06/02/21   Parrett, Fonnie Mu, NP  nitroGLYCERIN (NITROSTAT) 0.4 MG SL tablet Place 0.4 mg under the tongue every 5 (five) minutes x 3  doses as needed for chest pain.    [provider]  pantoprazole (PROTONIX) 40 MG tablet Take 1 tablet (40 mg total) by mouth daily. Take 30-60 min before first meal of the day 09/18/21   Tanda Rockers, MD  predniSONE (DELTASONE) 20 MG tablet Take 1 tablet (20 mg total) by mouth daily with breakfast. Patient taking differently: Take 20-40 mg by mouth See admin instructions. Take 2 tablets (40 mg) by mouth in the morning & take 1 tablet (20 mg) by mouth in the evening. 07/13/21   Parrett, Fonnie Mu, NP  SYMBICORT 160-4.5 MCG/ACT inhaler Inhale 2 puffs into the lungs in the morning and at bedtime. 12/22/21   Tanda Rockers, MD    Inpatient Medications: Scheduled Meds:  aspirin  325 mg Oral Daily   famotidine  20 mg Oral QPC supper   ipratropium-albuterol  3 mL Nebulization Q4H   levothyroxine  125 mcg Oral QAC breakfast   methylPREDNISolone (SOLU-MEDROL) injection  20 mg Intravenous  Q12H   pantoprazole  40 mg Oral QAC breakfast   rosuvastatin  5 mg Oral Daily   Continuous Infusions:  azithromycin Stopped (03/11/22 0845)   cefTRIAXone (ROCEPHIN)  IV Stopped (03/11/22 0939)   heparin 850 Units/hr (03/11/22 0645)   PRN Meds: albuterol, perflutren lipid microspheres (DEFINITY) IV suspension  Allergies:    Allergies  Allergen Reactions   Adenosine Anaphylaxis   Contrast Media [Iodinated Contrast Media] Anaphylaxis   Influenza Virus Vaccine Anaphylaxis   Nitrofurantoin Itching   Milk ([place])     Other reaction(s): Other Other Reaction: Other reaction   Breztri Aerosphere [Budeson-Glycopyrrol-Formoterol] Other (See Comments)    Urinary retention.   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  Tobacco Use   Smoking status: Never    Passive exposure: Past   Smokeless tobacco: Never  Scientific laboratory technician Use: Never used  Substance and Sexual Activity   Alcohol use: Not Currently    Comment: rarely   Drug use: No   Sexual activity: Not Currently    Birth control/protection: Surgical    Comment: Hysterectomy  Other Topics Concern   Not on file  Social History Narrative   Not on file   Social Determinants of Health   Financial Resource Strain: Not on file  Food Insecurity: Not on file  Transportation Needs: Not on file  Physical Activity: Not on file  Stress: Not on file  Social Connections: Not on file  Intimate Partner Violence: Not on file    Family History:    Family History  Problem Relation Age of Onset   Asthma Mother    Heart disease Mother    Asthma Sister    Heart disease Sister     Breast cancer Sister 65   Heart disease Father    Kidney disease Father    Prostate cancer Brother        dx. 13s   Heart attack Brother 74   Alzheimer's disease Brother 21   Breast cancer Maternal Aunt 70   Stomach cancer Maternal Grandfather  49   Asthma Sister    Memory loss Sister    Heart attack Son 72   Spinal muscular atrophy Grandchild        type II   Cancer Other 82       niece dx. ca of salivary gland    Breast cancer Other 68       niece; negative genetic testing 10 years ago   Melanoma Other    Alzheimer's disease Maternal Aunt    Cervical cancer Cousin        maternal 1st cousin dx. at 62   Breast cancer Cousin 61       maternal 1st cousin   Lung cancer Cousin 25       maternal 1st cousin; smoker   Heart disease Cousin    Leukemia Cousin 14       paternal 1st cousin   Breast cancer Other        paternal great grandmother (PGF's mother) dx. late 13s-early 33s   Coronary artery disease Other      ROS:  Please see the history of present illness.   All other ROS reviewed and negative.     Physical Exam/Data:   Vitals:   03/11/22 0645 03/11/22 0904 03/11/22 0915 03/11/22 0950  BP: 130/70  97/78 (S) (!) 89/58  Pulse: (!) 108  (!) 112 (!) 110  Resp: (!) 23  (!) 24 (!) 21  Temp:  97.7 F (36.5 C)    TempSrc:  Oral    SpO2: 94%  98% 94%    Intake/Output Summary (Last 24 hours) at 03/11/2022 1014 Last data filed at 03/11/2022 0939 Gross per 24 hour  Intake 349.79 ml  Output --  Net 349.79 ml      01/09/2022   11:41 AM 10/03/2021   10:37 AM 09/18/2021   10:20 AM  Last 3 Weights  Weight (lbs) 153 lb 9.6 oz 148 lb 148 lb 3.2 oz  Weight (kg) 69.673 kg 67.132 kg 67.223 kg     There is no height or weight on file to calculate BMI.  General: Older female, sitting up in bed wearing nasal cannula HEENT: normal Neck: no JVD Vascular: No carotid bruits; Distal pulses 2+ bilaterally Cardiac:  normal S1, S2; RRR; no murmur  Lungs: Expiratory wheezing  throughout Abd: soft, nontender, no hepatomegaly  Ext: 1+ LE edema bilaterally Musculoskeletal:  No deformities, BUE and BLE strength normal and equal Skin: warm and dry  Neuro:  CNs 2-12 intact, no focal abnormalities noted Psych:  Normal affect   EKG:  The EKG was personally reviewed and demonstrates:   12/30 sinus tachycardia, 113 bpm, PACs, slight ST depression in inferior lateral leads 12/31 sinus tachycardia, 102 bpm, resolved ST changes  Relevant CV Studies:  Echo: 03/11/2022  IMPRESSIONS     1. Left ventricular ejection fraction, by estimation, is >75%. The left  ventricle has hyperdynamic function. The left ventricle has no regional  wall motion abnormalities. Left ventricular diastolic parameters are  consistent with Grade I diastolic  dysfunction (impaired relaxation).   2. Right ventricular systolic function is normal. The right ventricular  size is normal. Tricuspid regurgitation signal is inadequate for assessing  PA pressure.   3. The mitral valve is degenerative. No evidence of mitral valve  regurgitation. Mild mitral stenosis. The mean mitral valve gradient is 4.5  mmHg.   4. The aortic valve is calcified. Aortic valve regurgitation is not  visualized. Aortic valve sclerosis/calcification is present, without any  evidence of aortic stenosis.   5. The inferior vena cava is normal in size with greater than 50%  respiratory variability, suggesting right atrial pressure of 3 mmHg.   Conclusion(s)/Recommendation(s): Normal for age.   FINDINGS   Left Ventricle: Left ventricular ejection fraction, by estimation, is  >75%. The left ventricle has hyperdynamic function. The left ventricle has  no regional wall motion abnormalities. The left ventricular internal  cavity size was small. There is no left   ventricular hypertrophy. Left ventricular diastolic parameters are  consistent with Grade I diastolic dysfunction (impaired relaxation).   Right Ventricle: The  right ventricular size is normal. No increase in  right ventricular wall thickness. Right ventricular systolic function is  normal. Tricuspid regurgitation signal is inadequate for assessing PA  pressure.   Left Atrium: Left atrial size was normal in size.   Right Atrium: Right atrial size was normal in size.   Pericardium: Trivial pericardial effusion is present.   Mitral Valve: The mitral valve is degenerative in appearance. There is  moderate thickening of the mitral valve leaflet(s). There is moderate  calcification of the mitral valve leaflet(s). Mild to moderate mitral  annular calcification. No evidence of  mitral valve regurgitation. Mild mitral valve stenosis. MV peak gradient,  10.0 mmHg. The mean mitral valve gradient is 4.5 mmHg.   Tricuspid Valve: The tricuspid valve is grossly normal. Tricuspid valve  regurgitation is not demonstrated. No evidence of tricuspid stenosis.   Aortic Valve: The aortic valve is calcified. Aortic valve regurgitation is  not visualized. Aortic valve sclerosis/calcification is present, without  any evidence of aortic stenosis.   Pulmonic Valve: The pulmonic valve was grossly normal. Pulmonic valve  regurgitation is not visualized. No evidence of pulmonic stenosis.   Aorta: The aortic root and ascending aorta are structurally normal, with  no evidence of dilitation.   Venous: The inferior vena cava is normal in size with greater than 50%  respiratory variability, suggesting right atrial pressure of 3 mmHg.   IAS/Shunts: No atrial level shunt detected by color flow Doppler.    Laboratory Data:  High Sensitivity Troponin:   Recent Labs  Lab 03/10/22 2318 03/11/22 0345  TROPONINIHS 469* 1,063*     Chemistry Recent Labs  Lab 03/10/22 2318  NA 142  K 3.7  CL 107  CO2 26  GLUCOSE 131*  BUN 20  CREATININE 1.06*  CALCIUM 9.1  GFRNONAA 52*  ANIONGAP 9    Recent Labs  Lab 03/10/22 2318  PROT 6.4*  ALBUMIN 3.6  AST 25  ALT  14  ALKPHOS 52  BILITOT 1.0   Lipids No results for input(s): "CHOL", "TRIG", "HDL", "LABVLDL", "LDLCALC", "CHOLHDL" in the last 168 hours.  Hematology Recent Labs  Lab 03/10/22 2318  WBC 9.0  RBC 4.70  HGB 14.6  HCT 44.3  MCV 94.3  MCH 31.1  MCHC 33.0  RDW 13.4  PLT 156   Thyroid  Recent Labs  Lab 03/10/22 2318  TSH 2.495    BNP Recent Labs  Lab 03/10/22 2318  BNP 46.8    DDimer No results for input(s): "DDIMER" in the last 168 hours.   Radiology/Studies:  ECHOCARDIOGRAM COMPLETE  Result Date: 03/11/2022    ECHOCARDIOGRAM REPORT   Patient Name:   LUISA LOUK Date of Exam: 03/11/2022 Medical Rec #:  [id number]                Height:       64.0 in Accession #:  [id number]               Weight:       153.6 lb Date of Birth:  12/21/1940                BSA:          1.749 m Patient Age:    58 years                 BP:           96/79 mmHg Patient Gender: F                        HR:           107 bpm. Exam Location:  Inpatient Procedure: 2D Echo, Cardiac Doppler and Color Doppler STAT ECHO Indications:    Elevated Troponin  History:        Patient has prior history of Echocardiogram examinations, most                 recent 07/17/2015. CAD and Previous Myocardial Infarction; COPD.                 Breast cancer.  Sonographer:    Darlina Sicilian RDCS Referring Phys: [phone] DEBBY CROSLEY  Sonographer Comments: Hiatal hernia IMPRESSIONS  1. Left ventricular ejection fraction, by estimation, is >75%. The left ventricle has hyperdynamic function. The left ventricle has no regional wall motion abnormalities. Left ventricular diastolic parameters are consistent with Grade I diastolic dysfunction (impaired relaxation).  2. Right ventricular systolic function is normal. The right ventricular size is normal. Tricuspid regurgitation signal is inadequate for assessing PA pressure.  3. The mitral valve is degenerative. No evidence of mitral valve regurgitation. Mild mitral stenosis. The  mean mitral valve gradient is 4.5 mmHg.  4. The aortic valve is calcified. Aortic valve regurgitation is not visualized. Aortic valve sclerosis/calcification is present, without any evidence of aortic stenosis.  5. The inferior vena cava is normal in size with greater than 50% respiratory variability, suggesting right atrial pressure of 3 mmHg. Conclusion(s)/Recommendation(s): Normal for age. FINDINGS  Left Ventricle: Left ventricular ejection fraction, by estimation, is >75%. The left ventricle has hyperdynamic function. The left ventricle has no regional wall motion abnormalities. The left ventricular internal cavity size was small. There is no left  ventricular hypertrophy. Left ventricular diastolic parameters are consistent with Grade I diastolic dysfunction (impaired relaxation). Right Ventricle: The right ventricular size is normal. No increase in right ventricular wall thickness. Right ventricular systolic function is normal. Tricuspid regurgitation signal is inadequate for assessing PA pressure. Left Atrium: Left atrial size was normal in size. Right Atrium: Right atrial size was normal in size. Pericardium: Trivial pericardial effusion is present. Mitral Valve: The mitral valve is degenerative in appearance. There is moderate thickening of the mitral valve leaflet(s). There is moderate calcification of the mitral valve leaflet(s). Mild to moderate mitral annular calcification. No evidence of mitral valve regurgitation. Mild mitral valve stenosis. MV peak gradient, 10.0 mmHg. The mean mitral valve gradient is 4.5 mmHg. Tricuspid Valve: The tricuspid valve is grossly normal. Tricuspid valve regurgitation is not demonstrated. No evidence of tricuspid stenosis. Aortic Valve: The aortic valve is calcified. Aortic valve regurgitation is not visualized. Aortic valve sclerosis/calcification is present, without any evidence of aortic stenosis. Pulmonic Valve: The pulmonic valve was grossly normal. Pulmonic valve  regurgitation is not visualized. No evidence of pulmonic stenosis. Aorta: The aortic root and ascending  aorta are structurally normal, with no evidence of dilitation. Venous: The inferior vena cava is normal in size with greater than 50% respiratory variability, suggesting right atrial pressure of 3 mmHg. IAS/Shunts: No atrial level shunt detected by color flow Doppler.  LEFT VENTRICLE PLAX 2D LVIDd:         3.90 cm     Diastology LVIDs:         2.40 cm     LV e' medial:    5.55 cm/s LV PW:         0.90 cm     LV E/e' medial:  20.9 LV IVS:        0.90 cm     LV e' lateral:   6.09 cm/s LVOT diam:     2.00 cm     LV E/e' lateral: 19.0 LV SV:         68 LV SV Index:   39 LVOT Area:     3.14 cm  LV Volumes (MOD) LV vol d, MOD A2C: 50.5 ml LV vol d, MOD A4C: 75.5 ml LV vol s, MOD A2C: 12.4 ml LV vol s, MOD A4C: 18.8 ml LV SV MOD A2C:     38.1 ml LV SV MOD A4C:     75.5 ml LV SV MOD BP:      48.1 ml RIGHT VENTRICLE TAPSE (M-mode): 1.5 cm LEFT ATRIUM             Index        RIGHT ATRIUM           Index LA diam:        2.90 cm 1.66 cm/m   RA Area:     10.90 cm LA Vol (A2C):   38.0 ml 21.73 ml/m  RA Volume:   21.20 ml  12.12 ml/m LA Vol (A4C):   41.2 ml 23.56 ml/m LA Biplane Vol: 41.9 ml 23.96 ml/m  AORTIC VALVE LVOT Vmax:   96.20 cm/s LVOT Vmean:  67.500 cm/s LVOT VTI:    0.217 m  AORTA Ao Root diam: 2.50 cm Ao Asc diam:  2.60 cm MITRAL VALVE MV Area (PHT): 3.63 cm     SHUNTS MV Area VTI:   2.85 cm     Systemic VTI:  0.22 m MV Peak grad:  10.0 mmHg    Systemic Diam: 2.00 cm MV Mean grad:  4.5 mmHg MV Vmax:       1.58 m/s MV Vmean:      99.4 cm/s MV Decel Time: 209 msec MV E velocity: 116.00 cm/s MV A velocity: 150.00 cm/s MV E/A ratio:  0.77 Mary Scientist, physiological signed by Phineas Inches Signature Date/Time: 03/11/2022/8:42:21 AM    Final    DG Chest Port 1 View  Result Date: 03/11/2022 CLINICAL DATA:  Shortness of breath EXAM: PORTABLE CHEST 1 VIEW COMPARISON:  08/14/2021 FINDINGS: Large hiatal hernia is  noted. Cardiac shadow is stable. Aortic calcifications are seen. Lungs are well aerated bilaterally. No focal infiltrate or effusion is seen. No bony abnormality is noted. IMPRESSION: Hiatal hernia.  No acute abnormality noted. Electronically Signed   By: Inez Catalina M.D.   On: 03/11/2022 00:00     Assessment and Plan:   Jaquelinne Glendening is a 81 y.o. female with a hx of CAD s/p PCI RCA, COPD, asthma, GERD, hypothyroidism, breast CA who is being seen 03/11/2022 for the evaluation of elevated troponin at the request of Dr. Marthenia Rolling.  NSTEMI CAD s/p PCI RCA '  08 --High-sensitivity troponin 469>> 1063.  In the setting of acute COPD exacerbation as well as RSV with possible pneumonia.  She does report some brief episodes of left arm pain/numbness in the days leading up to admission.  -- Echocardiogram 12/31 showed hyperdynamic LV function of greater than 75%, no regional wall motion abnormality, grade 1 diastolic dysfunction, normal RV size and function -- Continue IV heparin, reduce aspirin to 81 mg daily, she has been intolerant of higher statin doses, on Crestor 5 mg biweekly -- continue to trend enzymes, may be demand ischemia but also reports arm numbness/pain which was similar to prior angina episodes. Suspect she may need further ischemic evaluation this admission though would like her respiratory to improve   COPD exacerbation Asthma RSV --Presented with progressive dyspnea as well as URI symptoms.  Failed outpatient therapy.  Positive for RSV on admission. -- On steroids, nebulizers and antibiotics per primary  Hyperlipidemia -- Intolerant to higher doses of statin -- Crestor 5 mg biweekly  Risk Assessment/Risk Scores:   The patient's TIMI risk score is 4, which indicates a 20% risk of all cause mortality, new or recurrent myocardial infarction or need for urgent revascularization in the next 14 days.}  For questions or updates, please contact [facility] Please  consult [email or web address] for contact info under    Signed, Reino Bellis, NP  03/11/2022 10:14 AM

## 2022-03-11 NOTE — Progress Notes (Signed)
ANTICOAGULATION CONSULT NOTE - Initial Consult  Pharmacy Consult for heparin Indication: chest pain/ACS  Allergies  Allergen Reactions   Adenosine Anaphylaxis   Contrast Media [Iodinated Contrast Media] Anaphylaxis   Influenza Virus Vaccine Anaphylaxis   Nitrofurantoin Itching   Milk (Cow)     Other reaction(s): Other Other Reaction: Other reaction   Breztri Aerosphere [Budeson-Glycopyrrol-Formoterol] Other (See Comments)    Urinary retention.   Codeine Sulfate Nausea And Vomiting   Latex Other (See Comments)    Skin irritation    Patient Measurements:   Heparin Dosing Weight: 70 kg  Vital Signs: Temp: 98 F (36.7 C) (12/31 0417) Temp Source: Oral (12/30 2305) BP: 133/56 (12/31 0600) Pulse Rate: 103 (12/31 0600)  Labs: Recent Labs    03/10/22 2318 03/11/22 0345  HGB 14.6  --   HCT 44.3  --   PLT 156  --   CREATININE 1.06*  --   TROPONINIHS 469* 1,063*    CrCl cannot be calculated (Unknown ideal weight.).   Medical History: Past Medical History:  Diagnosis Date   Arthritis    Asthma    daily and prn inhalers; states good control currently   Breast cancer ([place])    right   CAD S/P percutaneous coronary angioplasty 07/15/2016   COPD (chronic obstructive pulmonary disease) (HCC)    Dyspnea    occ with exertion   Fibromyalgia    GERD (gastroesophageal reflux disease)    no current med.   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   Current Outpatient Medications on File Prior to Encounter  Medication Sig  Dispense Refill   albuterol (PROVENTIL) (2.5 MG/3ML) 0.083% nebulizer solution Take 3 mLs (2.5 mg total) by nebulization every 6 (six) hours as needed for wheezing or shortness of breath. (Patient taking differently: Take 2.5 mg by nebulization every 6 (six) hours as needed for wheezing or shortness of breath. Use 1 nebulizer (2.5 mg) (scheduled) in the morning & may use every 6 hours as needed for wheezing/shortness of breath.) 360 mL 11   albuterol (VENTOLIN HFA) 108 (90 Base) MCG/ACT inhaler 2 puffs every 4 hours if needed 18 g 11   calcium carbonate (TUMS - DOSED IN MG ELEMENTAL CALCIUM) 500 MG chewable tablet Chew 500 mg by mouth daily as needed for indigestion or heartburn.     cetirizine (ZYRTEC) 10 MG tablet Take 10 mg by mouth in the morning.     Cholecalciferol 25 MCG (1000 UT) capsule Take 1,000 Units by mouth every other day.     Cyanocobalamin (B-12) 5000 MCG CAPS Place 5,000 mcg under the tongue every other day.     famotidine (PEPCID) 20 MG tablet One after supper 30 tablet 11   levothyroxine (SYNTHROID) 125 MCG tablet Take 125 mcg by mouth daily before breakfast.     montelukast (SINGULAIR) 10 MG tablet Take 1 tablet (10 mg total) by mouth at bedtime. 30 tablet 11   nitroGLYCERIN (NITROSTAT) 0.4 MG SL tablet Place 0.4 mg  under the tongue every 5 (five) minutes x 3 doses as needed for chest pain.     pantoprazole (PROTONIX) 40 MG tablet Take 1 tablet (40 mg total) by mouth daily. Take 30-60 min before first meal of the day 30 tablet 2   predniSONE (DELTASONE) 20 MG tablet Take 1 tablet (20 mg total) by mouth daily with breakfast. (Patient taking differently: Take 20-40 mg by mouth See admin instructions. Take 2 tablets (40 mg) by mouth in the morning & take 1 tablet (20 mg) by mouth in the evening.) 5 tablet 0   SYMBICORT 160-4.5 MCG/ACT inhaler Inhale 2 puffs into the lungs in the morning and at bedtime. 33 g 3     Assessment: 81 y.o. female with SOB and elevated troponin, possible  ACS, for heparin Goal of Therapy:  Heparin level 0.3-0.7 units/ml Monitor platelets by anticoagulation protocol: Yes   Plan:  Heparin 4000 units IV bolus, then start heparin 850 units/hr Check heparin level in 8 hours.   Sonia [name] 03/11/2022,6:16 AM

## 2022-03-11 NOTE — Progress Notes (Signed)
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  She also has COPD/asthma, fibromyalgia, hypothyroidism and history of malignant melanoma.  Patient developed viral syndrome about a week prior to presentation.  Workup revealed positive RSV, elevated cardiac troponin but on the downward trend.  Chest x-ray did not reveal any acute abnormality.  03/11/2022: Patient seen.  Above summary noted.  Patient is currently on IV azithromycin, ceftriaxone and heparin drip.  Patient is also on IV Solu-Medrol and nebs DuoNeb.  Will add Singulair and incentive spirometry.  Patient has been noted to have intermittently low normal blood pressure.    Assessment & Plan:   Principal Problem:   COPD with acute exacerbation ([place]) Active Problems:   Dyslipidemia   COPD with asthma ([place])   Hypothyroidism   History of melanoma   Severe asthma with acute exacerbation   CAD S/P percutaneous coronary angioplasty   Carcinoma of upper-outer quadrant of right breast in female, estrogen receptor positive (HCC)   Elevated troponin   RSV (respiratory syncytial virus pneumonia)   Acute bronchiolitis due to respiratory syncytial virus (RSV)   COPD with acute exacerbation (HCC)/ Severe asthma with acute exacerbation  RSV (respiratory syncytial virus pneumonia) -Admit to cardiac telemetry -IV steroids ordered 20 mg IV every 12.  Patient would benefit from a higher dosage given her degree of wheezing however, she is quite resistant to high-dose of steroid.  She was told we will try the 20 mg every 12 but it may need to be increased.   -Will add IV.  Rocephin and azithromycin covering her increased risk of pneumonia with  COPD. -Scheduled and as needed nebulizers -Patient currently on room air 03/11/2022: Continue steroids, nebulizer treatment and antibiotics.  We are singular, incentive spirometry and flutter valve therapy.    Elevated troponin/CAD/concerns for NSTEMI: -Initial troponin 469.  Patient tachycardic, no chest pains.  No chest pains.  Likely demand -Repeat troponin 1063.  Patient seen, EKG without ischemic changes.  Tachycardic.  Patient states that she has chest pain however, on questioning patient now confused and unable to give a reliable answers.  When asked patient initially endorsed chest pains but when asked to identify where she hurts, she point to her stomach.  Patient does not recognize me or her surroundings.  Cardiology consult placed, stat 2D echo ordered.  Aspirin ordered.  Still could be due to demand. 03/11/2022: Troponin is on the downward trend.  Patient is on heparin drip.  Continue current management for now.  Likely, troponin will be repeated in the morning.    Dyslipidemia -Resume patient's Crestor 5 mg 2 times a week    Hypothyroidism/h/o Hashimoto's -Stable, Synthroid 112 mcg reordered   PMR/giant cell arteritis -Maintained on chronic p.o. steroids   Fibromyalgia H/o malignant melanoma H/o sclerosing adenitis breast  DVT prophylaxis: Heparin drip Code Status: Full code Family Communication:  Disposition Plan: DC depend on hospital course.   Consultants:  Cardiology  Procedures:  None  Antimicrobials:  IV ceftriaxone IV azithromycin   Subjective: No new complaints. No chest pain.  Objective: Vitals:   03/11/22 1100 03/11/22 1130 03/11/22 1215 03/11/22 1300  BP: (!) 137/124 104/77 94/67 103/66  Pulse: (!) 111 (!) 114 (!) 101 96  Resp: (!) 29 (!) 27 (!) 22 17  Temp:      TempSrc:      SpO2: 93% 92% 92% 98%    Intake/Output Summary (Last 24 hours) at 03/11/2022 1426 Last data filed at 03/11/2022 0939 Gross per 24 hour  Intake 349.79 ml  Output  --  Net 349.79 ml   There were no vitals filed for this visit.  Examination:  General exam: Appears calm and comfortable  Respiratory system: Decreased air entry.  Expiratory wheeze.   Cardiovascular system: S1 & S2 heard. Gastrointestinal system: Abdomen is soft and nontender.  Central nervous system: Awake and alert.  Patient moves all extremities.   Extremities: Fullness of the ankle.  Data Reviewed: I have personally reviewed following labs and imaging studies  CBC: Recent Labs  Lab 03/10/22 2318  WBC 9.0  HGB 14.6  HCT 44.3  MCV 94.3  PLT 324   Basic Metabolic Panel: Recent Labs  Lab 03/10/22 2318  NA 142  K 3.7  CL 107  CO2 26  GLUCOSE 131*  BUN 20  CREATININE 1.06*  CALCIUM 9.1   GFR: CrCl cannot be calculated (Unknown ideal weight.). Liver Function Tests: Recent Labs  Lab 03/10/22 2318  AST 25  ALT 14  ALKPHOS 52  BILITOT 1.0  PROT 6.4*  ALBUMIN 3.6   No results for input(s): "LIPASE", "AMYLASE" in the last 168 hours. No results for input(s): "AMMONIA" in the last 168 hours. Coagulation Profile: No results for input(s): "INR", "PROTIME" in the last 168 hours. Cardiac Enzymes: No results for input(s): "CKTOTAL", "CKMB", "CKMBINDEX", "TROPONINI" in the last 168 hours. BNP (last 3 results) No results for input(s): "PROBNP" in the last 8760 hours. HbA1C: No results for input(s): "HGBA1C" in the last 72 hours. CBG: No results for input(s): "GLUCAP" in the last 168 hours. Lipid Profile: Recent Labs    03/11/22 1135  CHOL 236*  HDL 90  LDLCALC 137*  TRIG 46  CHOLHDL 2.6   Thyroid Function Tests: Recent Labs    03/10/22 2318  TSH 2.495   Anemia Panel: No results for input(s): "VITAMINB12", "FOLATE", "FERRITIN", "TIBC", "IRON", "RETICCTPCT" in the last 72 hours. Urine analysis:    Component Value Date/Time   COLORURINE YELLOW 08/14/2021 [place] 08/14/2021 1604   LABSPEC 1.020 08/14/2021 1604   PHURINE 5.0  08/14/2021 [place] 08/14/2021 1604   GLUCOSEU NEGATIVE 11/28/2010 1422   HGBUR NEGATIVE 08/14/2021 1604   BILIRUBINUR NEGATIVE 08/14/2021 1604   KETONESUR NEGATIVE 08/14/2021 1604   PROTEINUR NEGATIVE 08/14/2021 1604   UROBILINOGEN 0.2 11/28/2010 1422   NITRITE NEGATIVE 08/14/2021 1604   LEUKOCYTESUR NEGATIVE 08/14/2021 1604   Sepsis Labs: '@LABRCNTIP'$ (procalcitonin:4,lacticidven:4)  ) Recent Results (from the past 240 hour(s))  Resp panel by RT-PCR (RSV, Flu A&B, Covid) Anterior Nasal Swab     Status: Abnormal   Collection Time: 03/10/22 11:13 PM   Specimen: Anterior Nasal Swab  Result Value Ref Range Status   SARS Coronavirus 2 by RT PCR NEGATIVE NEGATIVE Final    Comment: (NOTE) SARS-CoV-2 target nucleic acids are NOT DETECTED.  The SARS-CoV-2 RNA is generally detectable in upper respiratory specimens during the acute phase of infection. The lowest concentration of SARS-CoV-2 viral copies this assay can detect is 138 copies/mL. A negative result does not preclude SARS-Cov-2 infection and should not be used as the sole basis for treatment or other patient management decisions. A negative result may occur with  improper specimen collection/handling, submission  of specimen other than nasopharyngeal swab, presence of viral mutation(s) within the areas targeted by this assay, and inadequate number of viral copies(<138 copies/mL). A negative result must be combined with clinical observations, patient history, and epidemiological information. The expected result is Negative.  Fact Sheet for Patients:  EntrepreneurPulse.com.au  Fact Sheet for Healthcare Providers:  IncredibleEmployment.be  This test is no t yet approved or cleared by the [place] FDA and  has been authorized for detection and/or diagnosis of SARS-CoV-2 by FDA under an Emergency Use Authorization (EUA). This EUA will remain  in effect (meaning this test  can be used) for the duration of the COVID-19 declaration under Section 564(b)(1) of the Act, 21 U.S.C.section 360bbb-3(b)(1), unless the authorization is terminated  or revoked sooner.       Influenza A by PCR NEGATIVE NEGATIVE Final   Influenza B by PCR NEGATIVE NEGATIVE Final    Comment: (NOTE) The Xpert Xpress SARS-CoV-2/FLU/RSV plus assay is intended as an aid in the diagnosis of influenza from Nasopharyngeal swab specimens and should not be used as a sole basis for treatment. Nasal washings and aspirates are unacceptable for Xpert Xpress SARS-CoV-2/FLU/RSV testing.  Fact Sheet for Patients: EntrepreneurPulse.com.au  Fact Sheet for Healthcare Providers: IncredibleEmployment.be  This test is not yet approved or cleared by the [place] FDA and has been authorized for detection and/or diagnosis of SARS-CoV-2 by FDA under an Emergency Use Authorization (EUA). This EUA will remain in effect (meaning this test can be used) for the duration of the COVID-19 declaration under Section 564(b)(1) of the Act, 21 U.S.C. section 360bbb-3(b)(1), unless the authorization is terminated or revoked.     Resp Syncytial Virus by PCR POSITIVE (A) NEGATIVE Final    Comment: (NOTE) Fact Sheet for Patients: EntrepreneurPulse.com.au  Fact Sheet for Healthcare Providers: IncredibleEmployment.be  This test is not yet approved or cleared by the [place] FDA and has been authorized for detection and/or diagnosis of SARS-CoV-2 by FDA under an Emergency Use Authorization (EUA). This EUA will remain in effect (meaning this test can be used) for the duration of the COVID-19 declaration under Section 564(b)(1) of the Act, 21 U.S.C. section 360bbb-3(b)(1), unless the authorization is terminated or revoked.  Performed at [facility], [place] [address], [place], [place] [address]          Radiology  Studies: ECHOCARDIOGRAM COMPLETE  Result Date: 03/11/2022    ECHOCARDIOGRAM REPORT   Patient Name:   Sonia [name] Date of Exam: 03/11/2022 Medical Rec #:  [id number]                Height:       64.0 in Accession #:    [id number]               Weight:       153.6 lb Date of Birth:  12-20-1940                BSA:          1.749 m Patient Age:    47 years                 BP:           96/79 mmHg Patient Gender: F                        HR:           107 bpm. Exam Location:  Inpatient Procedure: 2D  Echo, Cardiac Doppler and Color Doppler STAT ECHO Indications:    Elevated Troponin  History:        Patient has prior history of Echocardiogram examinations, most                 recent 07/17/2015. CAD and Previous Myocardial Infarction; COPD.                 Breast cancer.  Sonographer:    Darlina Sicilian RDCS Referring Phys: [phone] DEBBY CROSLEY  Sonographer Comments: Hiatal hernia IMPRESSIONS  1. Left ventricular ejection fraction, by estimation, is >75%. The left ventricle has hyperdynamic function. The left ventricle has no regional wall motion abnormalities. Left ventricular diastolic parameters are consistent with Grade I diastolic dysfunction (impaired relaxation).  2. Right ventricular systolic function is normal. The right ventricular size is normal. Tricuspid regurgitation signal is inadequate for assessing PA pressure.  3. The mitral valve is degenerative. No evidence of mitral valve regurgitation. Mild mitral stenosis. The mean mitral valve gradient is 4.5 mmHg.  4. The aortic valve is calcified. Aortic valve regurgitation is not visualized. Aortic valve sclerosis/calcification is present, without any evidence of aortic stenosis.  5. The inferior vena cava is normal in size with greater than 50% respiratory variability, suggesting right atrial pressure of 3 mmHg. Conclusion(s)/Recommendation(s): Normal for age. FINDINGS  Left Ventricle: Left ventricular ejection fraction, by estimation, is >75%. The  left ventricle has hyperdynamic function. The left ventricle has no regional wall motion abnormalities. The left ventricular internal cavity size was small. There is no left  ventricular hypertrophy. Left ventricular diastolic parameters are consistent with Grade I diastolic dysfunction (impaired relaxation). Right Ventricle: The right ventricular size is normal. No increase in right ventricular wall thickness. Right ventricular systolic function is normal. Tricuspid regurgitation signal is inadequate for assessing PA pressure. Left Atrium: Left atrial size was normal in size. Right Atrium: Right atrial size was normal in size. Pericardium: Trivial pericardial effusion is present. Mitral Valve: The mitral valve is degenerative in appearance. There is moderate thickening of the mitral valve leaflet(s). There is moderate calcification of the mitral valve leaflet(s). Mild to moderate mitral annular calcification. No evidence of mitral valve regurgitation. Mild mitral valve stenosis. MV peak gradient, 10.0 mmHg. The mean mitral valve gradient is 4.5 mmHg. Tricuspid Valve: The tricuspid valve is grossly normal. Tricuspid valve regurgitation is not demonstrated. No evidence of tricuspid stenosis. Aortic Valve: The aortic valve is calcified. Aortic valve regurgitation is not visualized. Aortic valve sclerosis/calcification is present, without any evidence of aortic stenosis. Pulmonic Valve: The pulmonic valve was grossly normal. Pulmonic valve regurgitation is not visualized. No evidence of pulmonic stenosis. Aorta: The aortic root and ascending aorta are structurally normal, with no evidence of dilitation. Venous: The inferior vena cava is normal in size with greater than 50% respiratory variability, suggesting right atrial pressure of 3 mmHg. IAS/Shunts: No atrial level shunt detected by color flow Doppler.  LEFT VENTRICLE PLAX 2D LVIDd:         3.90 cm     Diastology LVIDs:         2.40 cm     LV e' medial:    5.55 cm/s  LV PW:         0.90 cm     LV E/e' medial:  20.9 LV IVS:        0.90 cm     LV e' lateral:   6.09 cm/s LVOT diam:     2.00 cm  LV E/e' lateral: 19.0 LV SV:         68 LV SV Index:   39 LVOT Area:     3.14 cm  LV Volumes (MOD) LV vol d, MOD A2C: 50.5 ml LV vol d, MOD A4C: 75.5 ml LV vol s, MOD A2C: 12.4 ml LV vol s, MOD A4C: 18.8 ml LV SV MOD A2C:     38.1 ml LV SV MOD A4C:     75.5 ml LV SV MOD BP:      48.1 ml RIGHT VENTRICLE TAPSE (M-mode): 1.5 cm LEFT ATRIUM             Index        RIGHT ATRIUM           Index LA diam:        2.90 cm 1.66 cm/m   RA Area:     10.90 cm LA Vol (A2C):   38.0 ml 21.73 ml/m  RA Volume:   21.20 ml  12.12 ml/m LA Vol (A4C):   41.2 ml 23.56 ml/m LA Biplane Vol: 41.9 ml 23.96 ml/m  AORTIC VALVE LVOT Vmax:   96.20 cm/s LVOT Vmean:  67.500 cm/s LVOT VTI:    0.217 m  AORTA Ao Root diam: 2.50 cm Ao Asc diam:  2.60 cm MITRAL VALVE MV Area (PHT): 3.63 cm     SHUNTS MV Area VTI:   2.85 cm     Systemic VTI:  0.22 m MV Peak grad:  10.0 mmHg    Systemic Diam: 2.00 cm MV Mean grad:  4.5 mmHg MV Vmax:       1.58 m/s MV Vmean:      99.4 cm/s MV Decel Time: 209 msec MV E velocity: 116.00 cm/s MV A velocity: 150.00 cm/s MV E/A ratio:  0.77 Mary Scientist, physiological signed by Phineas Inches Signature Date/Time: 03/11/2022/8:42:21 AM    Final    DG Chest Port 1 View  Result Date: 03/11/2022 CLINICAL DATA:  Shortness of breath EXAM: PORTABLE CHEST 1 VIEW COMPARISON:  08/14/2021 FINDINGS: Large hiatal hernia is noted. Cardiac shadow is stable. Aortic calcifications are seen. Lungs are well aerated bilaterally. No focal infiltrate or effusion is seen. No bony abnormality is noted. IMPRESSION: Hiatal hernia.  No acute abnormality noted. Electronically Signed   By: Inez Catalina M.D.   On: 03/11/2022 00:00        Scheduled Meds:  [START ON 03/12/2022] aspirin EC  81 mg Oral Daily   famotidine  20 mg Oral QPC supper   ipratropium-albuterol  3 mL Nebulization Q4H   levothyroxine  125 mcg  Oral QAC breakfast   methylPREDNISolone (SOLU-MEDROL) injection  20 mg Intravenous Q12H   pantoprazole  40 mg Oral QAC breakfast   rosuvastatin  5 mg Oral Daily   Continuous Infusions:  azithromycin Stopped (03/11/22 0845)   cefTRIAXone (ROCEPHIN)  IV Stopped (03/11/22 0939)   heparin 850 Units/hr (03/11/22 0645)     LOS: 0 days    Time spent: 55 minutes.    Dana Allan, MD  [facility] Pager #: [phone] 7PM-7AM contact night coverage as above

## 2022-03-11 NOTE — H&P (Addendum)
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  She also has COPD/asthma, fibromyalgia, hypothyroidism and history of malignant melanoma.  On Christmas eve she spent with her granddaughter and was exposed to some sniffles.  She started coming down with congestion and wheezing, sinus drainage and infection.  She started on amoxicillin, however, her sinus infection continued to worsen.  Her postnasal drip, irritated and her asthma flare.  She reports chills but no fever.  She states she has been very short of breath, she denies coughing.  She denies chest pains.  She is maintained on 7 mg of prednisone daily.  She has been slowly titrated down over the ER.  She was maintained on 60 mg prednisone daily for a year prior for her PMR.  In the ER patient was wheezing throughout.  She has received continuous nebulizers, IV mag IV steroids and other nebulizer treatments.  She continues to wheeze.  Her initial troponin was 269, thought to be due to demand.  The patient denies any chest pains.  Hospitalist have been asked to admit.  Review of Systems:  The patient denies anorexia, fever, weight loss,, vision loss, decreased hearing, hoarseness, chest pain, syncope, dyspnea on exertion, peripheral edema, balance deficits, hemoptysis, abdominal pain, melena, hematochezia, severe indigestion/heartburn, hematuria, incontinence, genital sores, muscle weakness, suspicious skin lesions, transient blindness, difficulty walking, depression, unusual weight change, abnormal bleeding, enlarged lymph nodes, angioedema, and breast masses. Positives: Wheezing, chills, postnasal drip, sinus Infection symptoms.  No chest pains  Past Medical History: Past Medical History:  Diagnosis Date   Arthritis    Asthma     daily and prn inhalers; states good control currently   Breast cancer ([place])    right   CAD S/P percutaneous coronary angioplasty 07/15/2016   COPD (chronic obstructive pulmonary disease) (HCC)    Dyspnea    occ with exertion   Fibromyalgia    GERD (gastroesophageal reflux disease)    no current med.   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   Vitamin D deficiency    Past Surgical History:  Procedure Laterality Date   ABDOMINAL HYSTERECTOMY     partial   APPENDECTOMY     BREAST BIOPSY Left    BREAST CYST EXCISION Left    BREAST EXCISIONAL BIOPSY Left 03/2015   BREAST LUMPECTOMY Right 08/2018   BREAST LUMPECTOMY WITH RADIOACTIVE SEED LOCALIZATION Left 04/07/2015   Procedure: BREAST LUMPECTOMY WITH RADIOACTIVE SEED LOCALIZATION;  Surgeon: Erroll Luna, MD;  Location: [facility];  Service: General;  Laterality: Left;   BREAST LUMPECTOMY WITH RADIOACTIVE SEED LOCALIZATION Right 08/13/2018   Procedure: RIGHT BREAST LUMPECTOMY WITH RADIOACTIVE SEED LOCALIZATION;  Surgeon: Rolm Bookbinder, MD;  Location: [facility];  Service: General;  Laterality: Right;   COLONOSCOPY     CORONARY STENT INTERVENTION N/A 07/15/2016   Procedure: Coronary Stent Intervention;  Surgeon: Sherren Mocha,  MD;  Location: [facility];  Service: Cardiovascular;  Laterality: N/A;   ESOPHAGOGASTRODUODENOSCOPY (EGD) WITH PROPOFOL Left 07/25/2016   Procedure: ESOPHAGOGASTRODUODENOSCOPY (EGD) WITH PROPOFOL;  Surgeon: Ronnette Juniper, MD;  Location: [facility];  Service: Gastroenterology;  Laterality: Left;   KNEE ARTHROSCOPY Right 12/07/2003    LAPAROSCOPIC SALPINGO OOPHERECTOMY Right 09/08/2003   LEFT HEART CATH N/A 07/15/2016   Procedure: Left Heart Cath;  Surgeon: Sherren Mocha, MD;  Location: [facility];  Service: Cardiovascular;  Laterality: N/A;   LEFT HEART CATH AND CORONARY ANGIOGRAPHY N/A 04/14/2019   Procedure: LEFT HEART CATH AND CORONARY ANGIOGRAPHY;  Surgeon: Burnell Blanks, MD;  Location: [facility];  Service: Cardiovascular;  Laterality: N/A;   LYSIS OF ADHESION  09/08/2003   NASAL SINUS SURGERY  age 32   RECTAL POLYPECTOMY  10/02/2006    Medications: Prior to Admission medications   Medication Sig Start Date End Date Taking? Authorizing Provider  albuterol (PROVENTIL) (2.5 MG/3ML) 0.083% nebulizer solution Take 3 mLs (2.5 mg total) by nebulization every 6 (six) hours as needed for wheezing or shortness of breath. Patient taking differently: Take 2.5 mg by nebulization every 6 (six) hours as needed for wheezing or shortness of breath. Use 1 nebulizer (2.5 mg) (scheduled) in the morning & may use every 6 hours as needed for wheezing/shortness of breath. 05/29/21   Tanda Rockers, MD  albuterol (VENTOLIN HFA) 108 (90 Base) MCG/ACT inhaler 2 puffs every 4 hours if needed 01/09/22   Tanda Rockers, MD  calcium carbonate (TUMS - DOSED IN MG ELEMENTAL CALCIUM) 500 MG chewable tablet Chew 500 mg by mouth daily as needed for indigestion or heartburn.    [provider]  cetirizine (ZYRTEC) 10 MG tablet Take 10 mg by mouth in the morning.    [provider]  Cholecalciferol 25 MCG (1000 UT) capsule Take 1,000 Units by mouth every other day.    [provider]  Cyanocobalamin (B-12) 5000 MCG CAPS Place 5,000 mcg under the tongue every other day.    [provider]  famotidine (PEPCID) 20 MG tablet One after supper 09/18/21   Tanda Rockers, MD  levothyroxine (SYNTHROID) 125 MCG tablet Take 125 mcg by mouth daily before breakfast.    [provider]   montelukast (SINGULAIR) 10 MG tablet Take 1 tablet (10 mg total) by mouth at bedtime. 06/02/21   Parrett, Fonnie Mu, NP  nitroGLYCERIN (NITROSTAT) 0.4 MG SL tablet Place 0.4 mg under the tongue every 5 (five) minutes x 3 doses as needed for chest pain.    [provider]  pantoprazole (PROTONIX) 40 MG tablet Take 1 tablet (40 mg total) by mouth daily. Take 30-60 min before first meal of the day 09/18/21   Tanda Rockers, MD  predniSONE (DELTASONE) 20 MG tablet Take 1 tablet (20 mg total) by mouth daily with breakfast. Patient taking differently: Take 20-40 mg by mouth See admin instructions. Take 2 tablets (40 mg) by mouth in the morning & take 1 tablet (20 mg) by mouth in the evening. 07/13/21   Parrett, Fonnie Mu, NP  SYMBICORT 160-4.5 MCG/ACT inhaler Inhale 2 puffs into the lungs in the morning and at bedtime. 12/22/21   Tanda Rockers, MD    Allergies:   Allergies  Allergen Reactions   Adenosine Anaphylaxis   Contrast Media [Iodinated Contrast Media] Anaphylaxis   Influenza Virus Vaccine Anaphylaxis   Nitrofurantoin Itching   Milk (Cow)     Other  reaction(s): Other Other Reaction: Other reaction   Breztri Aerosphere [Budeson-Glycopyrrol-Formoterol] Other (See Comments)    Urinary retention.   Codeine Sulfate Nausea And Vomiting   Latex Other (See Comments)    Skin irritation    Social History:  reports that she has never smoked. She has been exposed to tobacco smoke. She has never used smokeless tobacco. She reports that she does not currently use alcohol. She reports that she does not use drugs.  Family History: Family History  Problem Relation Age of Onset   Asthma Mother    Heart disease Mother    Asthma Sister    Heart disease Sister    Breast cancer Sister 63   Heart disease Father    Kidney disease Father    Prostate cancer Brother        dx. 27s   Heart attack Brother 62   Alzheimer's disease Brother 28   Breast cancer Maternal Aunt 70   Stomach cancer  Maternal Grandfather 96   Asthma Sister    Memory loss Sister    Heart attack Son 74   Spinal muscular atrophy Grandchild        type II   Cancer Other 57       niece dx. ca of salivary gland    Breast cancer Other 67       niece; negative genetic testing 10 years ago   Melanoma Other    Alzheimer's disease Maternal Aunt    Cervical cancer Cousin        maternal 1st cousin dx. at 70   Breast cancer Cousin 10       maternal 1st cousin   Lung cancer Cousin 5       maternal 1st cousin; smoker   Heart disease Cousin    Leukemia Cousin 14       paternal 1st cousin   Breast cancer Other        paternal great grandmother (PGF's mother) dx. late 9s-early 24s   Coronary artery disease Other     Physical Exam: Vitals:   03/10/22 2300 03/10/22 2305  BP:  127/78  Pulse:  (!) 110  Resp:  (!) 23  Temp:  98 F (36.7 C)  TempSrc:  Oral  SpO2: 96% 100%    General:  Alert and oriented times three, well developed and nourished, no acute distress Eyes: PERRLA, pink conjunctiva, no scleral icterus ENT: Moist oral mucosa, neck supple, no thyromegaly Lungs: Rhonchorous throughout, no wheezing Cardiovascular: regular rate and rhythm, no regurgitation, no gallops, no murmurs. No carotid bruits, no JVD Abdomen: soft, positive BS, non-tender, non-distended, no organomegaly, not an acute abdomen GU: not examined Neuro: CN II - XII grossly intact, sensation intact Musculoskeletal: strength 5/5 all extremities, no clubbing, cyanosis or edema Skin: no rash, no subcutaneous crepitation, no decubitus Psych: appropriate patient  Labs on Admission:  Recent Labs    03/10/22 2318  NA 142  K 3.7  CL 107  CO2 26  GLUCOSE 131*  BUN 20  CREATININE 1.06*  CALCIUM 9.1   Recent Labs    03/10/22 2318  AST 25  ALT 14  ALKPHOS 52  BILITOT 1.0  PROT 6.4*  ALBUMIN 3.6    Recent Labs    03/10/22 2318  WBC 9.0  HGB 14.6  HCT 44.3  MCV 94.3  PLT 156    Recent Labs    03/10/22 2318   TSH 2.495    Micro Results: Recent Results (from the past  240 hour(s))  Resp panel by RT-PCR (RSV, Flu A&B, Covid) Anterior Nasal Swab     Status: Abnormal   Collection Time: 03/10/22 11:13 PM   Specimen: Anterior Nasal Swab  Result Value Ref Range Status   SARS Coronavirus 2 by RT PCR NEGATIVE NEGATIVE Final    Comment: (NOTE) SARS-CoV-2 target nucleic acids are NOT DETECTED.  The SARS-CoV-2 RNA is generally detectable in upper respiratory specimens during the acute phase of infection. The lowest concentration of SARS-CoV-2 viral copies this assay can detect is 138 copies/mL. A negative result does not preclude SARS-Cov-2 infection and should not be used as the sole basis for treatment or other patient management decisions. A negative result may occur with  improper specimen collection/handling, submission of specimen other than nasopharyngeal swab, presence of viral mutation(s) within the areas targeted by this assay, and inadequate number of viral copies(<138 copies/mL). A negative result must be combined with clinical observations, patient history, and epidemiological information. The expected result is Negative.  Fact Sheet for Patients:  EntrepreneurPulse.com.au  Fact Sheet for Healthcare Providers:  IncredibleEmployment.be  This test is no t yet approved or cleared by the [place] FDA and  has been authorized for detection and/or diagnosis of SARS-CoV-2 by FDA under an Emergency Use Authorization (EUA). This EUA will remain  in effect (meaning this test can be used) for the duration of the COVID-19 declaration under Section 564(b)(1) of the Act, 21 U.S.C.section 360bbb-3(b)(1), unless the authorization is terminated  or revoked sooner.       Influenza A by PCR NEGATIVE NEGATIVE Final   Influenza B by PCR NEGATIVE NEGATIVE Final    Comment: (NOTE) The Xpert Xpress SARS-CoV-2/FLU/RSV plus assay is intended as an aid in the  diagnosis of influenza from Nasopharyngeal swab specimens and should not be used as a sole basis for treatment. Nasal washings and aspirates are unacceptable for Xpert Xpress SARS-CoV-2/FLU/RSV testing.  Fact Sheet for Patients: EntrepreneurPulse.com.au  Fact Sheet for Healthcare Providers: IncredibleEmployment.be  This test is not yet approved or cleared by the [place] FDA and has been authorized for detection and/or diagnosis of SARS-CoV-2 by FDA under an Emergency Use Authorization (EUA). This EUA will remain in effect (meaning this test can be used) for the duration of the COVID-19 declaration under Section 564(b)(1) of the Act, 21 U.S.C. section 360bbb-3(b)(1), unless the authorization is terminated or revoked.     Resp Syncytial Virus by PCR POSITIVE (A) NEGATIVE Final    Comment: (NOTE) Fact Sheet for Patients: EntrepreneurPulse.com.au  Fact Sheet for Healthcare Providers: IncredibleEmployment.be  This test is not yet approved or cleared by the [place] FDA and has been authorized for detection and/or diagnosis of SARS-CoV-2 by FDA under an Emergency Use Authorization (EUA). This EUA will remain in effect (meaning this test can be used) for the duration of the COVID-19 declaration under Section 564(b)(1) of the Act, 21 U.S.C. section 360bbb-3(b)(1), unless the authorization is terminated or revoked.  Performed at [facility], [place] [address] [place], [place] [address]      Radiological Exams on Admission: DG Chest Port 1 View  Result Date: 03/11/2022 CLINICAL DATA:  Shortness of breath EXAM: PORTABLE CHEST 1 VIEW COMPARISON:  08/14/2021 FINDINGS: Large hiatal hernia is noted. Cardiac shadow is stable. Aortic calcifications are seen. Lungs are well aerated bilaterally. No focal infiltrate or effusion is seen. No bony abnormality is noted. IMPRESSION: Hiatal hernia.  No acute  abnormality noted. Electronically Signed   By: Elta Guadeloupe  Lukens M.D.   On: 03/11/2022 00:00    Assessment/Plan Present on Admission:  COPD with acute exacerbation (HCC)/ Severe asthma with acute exacerbation  RSV (respiratory syncytial virus pneumonia) -Admit to cardiac telemetry -IV steroids ordered 20 mg IV every 12.  Patient would benefit from a higher dosage given her degree of wheezing however, she is quite resistant to high-dose of steroid.  She was told we will try the 20 mg every 12 but it may need to be increased.   -Will add IV.  Rocephin and azithromycin covering her increased risk of pneumonia with COPD. -Scheduled and as needed nebulizers -Patient currently on room air   Elevated troponin/CAD -Initial troponin 469.  Patient tachycardic, no chest pains.  No chest pains.  Likely demand -Repeat troponin 1063.  Patient seen, EKG without ischemic changes.  Tachycardic.  Patient states that she has chest pain however, on questioning patient now confused and unable to give a reliable answers.  When asked patient initially endorsed chest pains but when asked to identify where she hurts, she point to her stomach.  Patient does not recognize me or her surroundings.  Cardiology consult placed, stat 2D echo ordered.  Aspirin ordered.  Still could be due to demand.   Dyslipidemia -Resume patient's Crestor 5 mg 2 times a week   Hypothyroidism/h/o Hashimoto's -Stable, Synthroid 112 mcg reordered  PMR/giant cell arteritis -Maintained on chronic p.o. steroids  Fibromyalgia H/o malignant melanoma H/o sclerosing adenitis breast    Rayshun Kandler 03/11/2022, 2:19 AM

## 2022-03-11 NOTE — Progress Notes (Signed)
Afib and RSV+, with HR reaching up to 140s when ambulating and coughing    03/11/22 2103  Assess: MEWS Score  Temp 98.2 F (36.8 C)  BP (!) 152/68  MAP (mmHg) 86  Pulse Rate (!) 126  ECG Heart Rate (!) 125  Resp (!) 21  SpO2 100 %  O2 Device Room Air  Assess: MEWS Score  MEWS Temp 0  MEWS Systolic 0  MEWS Pulse 2  MEWS RR 1  MEWS LOC 0  MEWS Score 3  MEWS Score Color Yellow  Assess: if the MEWS score is Yellow or Red  Were vital signs taken at a resting state? Yes  Focused Assessment No change from prior assessment  Does the patient meet 2 or more of the SIRS criteria? Yes  Does the patient have a confirmed or suspected source of infection? Yes  Provider and Rapid Response Notified? No  MEWS guidelines implemented *See Row Information* Yes  Treat  Pain Scale 0-10  Pain Score 0  Take Vital Signs  Increase Vital Sign Frequency  Yellow: Q 2hr X 2 then Q 4hr X 2, if remains yellow, continue Q 4hrs  Escalate  MEWS: Escalate Yellow: discuss with charge nurse/RN and consider discussing with provider and RRT  Notify: Charge Nurse/RN  Name of Charge Nurse/RN Notified Glenna Durand, RN  Date Charge Nurse/RN Notified 03/11/22  Time Charge Nurse/RN Notified 2203  Document  Patient Outcome Stabilized after interventions  Progress note created (see row info) Yes  Assess: SIRS CRITERIA  SIRS Temperature  0  SIRS Pulse 1  SIRS Respirations  1  SIRS WBC 0  SIRS Score Sum  2

## 2022-03-11 NOTE — Progress Notes (Signed)
[place] for heparin Indication: chest pain/ACS Brief A/P: Heparin level within goal range Continue Heparin at current rate   Allergies  Allergen Reactions   Adenosine Anaphylaxis   Contrast Media [Iodinated Contrast Media] Anaphylaxis   Influenza Virus Vaccine Anaphylaxis   Nitrofurantoin Itching   Milk (Cow)     Other reaction(s): Other Other Reaction: Other reaction   Breztri Aerosphere [Budeson-Glycopyrrol-Formoterol] Other (See Comments)    Urinary retention.   Codeine Sulfate Nausea And Vomiting   Latex Other (See Comments)    Skin irritation    Patient Measurements: Height: '5\' 4"'$  (162.6 cm) Weight: 69.6 kg (153 lb 6.4 oz) IBW/kg (Calculated) : 54.7 Heparin Dosing Weight: 70 kg  Vital Signs: Temp: 98.2 F (36.8 C) (12/31 2103) Temp Source: Oral (12/31 2103) BP: 128/87 (12/31 2303) Pulse Rate: 126 (12/31 2103)  Labs: Recent Labs    03/10/22 2318 03/11/22 0345 03/11/22 1135 03/11/22 1400 03/11/22 1811 03/11/22 2203  HGB 14.6  --   --   --   --   --   HCT 44.3  --   --   --   --   --   PLT 156  --   --   --   --   --   HEPARINUNFRC  --   --   --  0.60  --  0.49  CREATININE 1.06*  --   --   --   --   --   TROPONINIHS 469* 1,063* 264*  --  403*  --      Estimated Creatinine Clearance: 38.5 mL/min (A) (by C-G formula based on SCr of 1.06 mg/dL (H)).  Assessment: 81 y.o. female with SOB and elevated troponin, possible ACS, for heparin Goal of Therapy:  Heparin level 0.3-0.7 units/ml Monitor platelets by anticoagulation protocol: Yes   Plan:  Continue Heparin at current rate   Eyden Dobie, Bronson Curb 03/11/2022,11:42 PM

## 2022-03-11 NOTE — Progress Notes (Signed)
ANTICOAGULATION CONSULT NOTE - Initial Consult  Pharmacy Consult for heparin Indication: chest pain/ACS  Allergies  Allergen Reactions   Adenosine Anaphylaxis   Contrast Media [Iodinated Contrast Media] Anaphylaxis   Influenza Virus Vaccine Anaphylaxis   Nitrofurantoin Itching   Milk (Cow)     Other reaction(s): Other Other Reaction: Other reaction   Breztri Aerosphere [Budeson-Glycopyrrol-Formoterol] Other (See Comments)    Urinary retention.   Codeine Sulfate Nausea And Vomiting   Latex Other (See Comments)    Skin irritation    Patient Measurements:   Heparin Dosing Weight: 70 kg  Vital Signs: Temp: 97.8 F (36.6 C) (12/31 1527) Temp Source: Oral (12/31 1527) BP: 148/80 (12/31 1545) Pulse Rate: 104 (12/31 1545)  Labs: Recent Labs    03/10/22 2318 03/11/22 0345 03/11/22 1135 03/11/22 1400  HGB 14.6  --   --   --   HCT 44.3  --   --   --   PLT 156  --   --   --   HEPARINUNFRC  --   --   --  0.60  CREATININE 1.06*  --   --   --   TROPONINIHS 469* 1,063* 264*  --      CrCl cannot be calculated (Unknown ideal weight.).   Medical History: Past Medical History:  Diagnosis Date   Arthritis    Asthma    daily and prn inhalers; states good control currently   Breast cancer ([place])    right   CAD S/P percutaneous coronary angioplasty 07/15/2016   COPD (chronic obstructive pulmonary disease) (HCC)    Dyspnea    occ with exertion   Fibromyalgia    GERD (gastroesophageal reflux disease)    no current med.   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   Current Outpatient Medications on File Prior to Encounter  Medication Sig Dispense Refill   albuterol (PROVENTIL) (2.5 MG/3ML) 0.083% nebulizer solution Take 3 mLs (2.5 mg total) by nebulization every 6 (six) hours as needed for wheezing or shortness of breath. (Patient taking differently: Take 2.5 mg by nebulization every 6 (six) hours as needed for wheezing or shortness of breath. Use 1 nebulizer (2.5 mg) (scheduled) in the morning & may use every 6 hours as needed for wheezing/shortness of breath.) 360 mL 11   albuterol (VENTOLIN HFA) 108 (90 Base) MCG/ACT inhaler 2 puffs every 4 hours if needed 18 g 11   calcium carbonate (TUMS - DOSED IN MG ELEMENTAL CALCIUM) 500 MG chewable tablet Chew 500 mg by mouth daily as needed for indigestion or heartburn.     cetirizine (ZYRTEC) 10 MG tablet Take 10 mg by mouth in the morning.     Cholecalciferol 25 MCG (1000 UT) capsule Take 1,000 Units by mouth every other day.     Cyanocobalamin (B-12) 5000 MCG CAPS Place 5,000 mcg under the tongue every other day.     famotidine (PEPCID) 20 MG tablet One after supper 30 tablet 11   levothyroxine (  SYNTHROID) 125 MCG tablet Take 125 mcg by mouth daily before breakfast.     montelukast (SINGULAIR) 10 MG tablet Take 1 tablet (10 mg total) by mouth at bedtime. 30 tablet 11   nitroGLYCERIN (NITROSTAT) 0.4 MG SL tablet Place 0.4 mg under the tongue every 5 (five) minutes x 3 doses as needed for chest pain.     pantoprazole (PROTONIX) 40 MG tablet Take 1 tablet (40 mg total) by mouth daily. Take 30-60 min before first meal of the day 30 tablet 2   predniSONE (DELTASONE) 20 MG tablet Take 1 tablet (20 mg total) by mouth daily with breakfast. (Patient taking differently: Take 20-40 mg by mouth See admin instructions. Take 2 tablets (40 mg) by mouth in the morning & take 1 tablet (20 mg) by mouth in the evening.) 5 tablet 0   SYMBICORT 160-4.5 MCG/ACT inhaler Inhale 2 puffs into the  lungs in the morning and at bedtime. 33 g 3     Assessment: 81 y.o. female with SOB and elevated troponin. Cardiology consulted for NSTEMI, plan for LHC once patient is able to lay flat. Trop on downward trend.   HL therapeutic at 0.60 while on heparin 850u/hr.   Goal of Therapy:  Heparin level 0.3-0.7 units/ml Monitor platelets by anticoagulation protocol: Yes   Plan:  Continue heparin at 850u/hr.  Recheck heparin level in 8 hours.  Monitor H & H daily.   Ventura Sellers 03/11/2022,4:36 PM

## 2022-03-11 NOTE — ED Notes (Signed)
Pt's O2 ranging 86-88% on RA while sleeping. Pt placed on 2lpm [place], O2 increased to 91-94%.

## 2022-03-11 NOTE — Progress Notes (Signed)
Increased exp wheeze/SOB. Albuterol Neb prn given at this time. O2 sats in mid 90's on 2L [place].

## 2022-03-11 NOTE — ED Provider Notes (Signed)
[facility] DEPARTMENT Provider Note   CSN: [id number] Arrival date & time: 03/10/22  2259     History  Chief Complaint  Patient presents with   Shortness of Breath   *Neighbor was present to assist in history  Sonia [name] is a 81 y.o. female, h/o asthma/COPD, presented with 1d h/o shortness of breath. 5d ago patient began experiencing sinusitis-like symptoms with yellow nasal drainage and began taking amoxicillin from a previous sinusitis.  Patient noted 5 days ago that at a Christmas party her granddaughter was sick. Patient denied N/V, fatigue, today, fevers, productive cough, abdominal pain, chest pain, chest pressure, skin color changes, syncope.  - Exertional: Yes, and at rest - Orthopnea: denied - Cough, Fever, CP, Edema: denied - Cardio/Pulm PMH: h/o asthma/COPD - Tobacco/EtOH/Drugs: denied - Meds: takes albuterol, symbicort, singular   Home Medications Prior to Admission medications   Medication Sig Start Date End Date Taking? Authorizing Provider  albuterol (PROVENTIL) (2.5 MG/3ML) 0.083% nebulizer solution Take 3 mLs (2.5 mg total) by nebulization every 6 (six) hours as needed for wheezing or shortness of breath. Patient taking differently: Take 2.5 mg by nebulization every 6 (six) hours as needed for wheezing or shortness of breath. Use 1 nebulizer (2.5 mg) (scheduled) in the morning & may use every 6 hours as needed for wheezing/shortness of breath. 05/29/21   Tanda Rockers, MD  albuterol (VENTOLIN HFA) 108 (90 Base) MCG/ACT inhaler 2 puffs every 4 hours if needed 01/09/22   Tanda Rockers, MD  calcium carbonate (TUMS - DOSED IN MG ELEMENTAL CALCIUM) 500 MG chewable tablet Chew 500 mg by mouth daily as needed for indigestion or heartburn.    [provider]  cetirizine (ZYRTEC) 10 MG tablet Take 10 mg by mouth in the morning.    [provider]  Cholecalciferol 25 MCG (1000 UT) capsule Take 1,000 Units by  mouth every other day.    [provider]  Cyanocobalamin (B-12) 5000 MCG CAPS Place 5,000 mcg under the tongue every other day.    [provider]  famotidine (PEPCID) 20 MG tablet One after supper 09/18/21   Tanda Rockers, MD  levothyroxine (SYNTHROID) 125 MCG tablet Take 125 mcg by mouth daily before breakfast.    [provider]  montelukast (SINGULAIR) 10 MG tablet Take 1 tablet (10 mg total) by mouth at bedtime. 06/02/21   Parrett, Fonnie Mu, NP  nitroGLYCERIN (NITROSTAT) 0.4 MG SL tablet Place 0.4 mg under the tongue every 5 (five) minutes x 3 doses as needed for chest pain.    [provider]  pantoprazole (PROTONIX) 40 MG tablet Take 1 tablet (40 mg total) by mouth daily. Take 30-60 min before first meal of the day 09/18/21   Tanda Rockers, MD  predniSONE (DELTASONE) 20 MG tablet Take 1 tablet (20 mg total) by mouth daily with breakfast. Patient taking differently: Take 20-40 mg by mouth See admin instructions. Take 2 tablets (40 mg) by mouth in the morning & take 1 tablet (20 mg) by mouth in the evening. 07/13/21   Parrett, Fonnie Mu, NP  SYMBICORT 160-4.5 MCG/ACT inhaler Inhale 2 puffs into the lungs in the morning and at bedtime. 12/22/21   Tanda Rockers, MD      Allergies    Adenosine, Contrast media [iodinated contrast media], Influenza virus vaccine, Nitrofurantoin, Milk (cow), Breztri aerosphere [budeson-glycopyrrol-formoterol], Codeine sulfate, and Latex    Review of Systems   Review of Systems  Respiratory:  Positive for shortness of breath.   See HPI  Physical Exam Updated Vital Signs BP 127/78 (BP Location: Left Arm)   Pulse (!) 110   Temp 98 F (36.7 C) (Oral)   Resp (!) 23   SpO2 100%  Physical Exam HENT:     Head: Normocephalic.  Eyes:     Extraocular Movements: Extraocular movements intact.     Pupils: Pupils are equal, round, and reactive to light.  Cardiovascular:     Rate and Rhythm: Regular rhythm. Tachycardia present.   Pulmonary:     Effort: Tachypnea present. No bradypnea, accessory muscle usage or respiratory distress.     Breath sounds: No stridor. Wheezing (bilateral, throughout entire lungs) present. No rhonchi.     Comments: No accessory muscle use Retractions noted Chest:     Chest wall: No tenderness.  Abdominal:     General: Abdomen is flat.     Tenderness: There is no abdominal tenderness. There is no guarding.  Musculoskeletal:        General: Normal range of motion.     Cervical back: Normal range of motion.     Right lower leg: No edema.     Left lower leg: No edema.  Skin:    General: Skin is warm and dry.     Capillary Refill: Capillary refill takes less than 2 seconds.     Coloration: Skin is not cyanotic or pale.     Nails: There is no clubbing.  Neurological:     General: No focal deficit present.     Mental Status: She is alert and oriented to person, place, and time.  Psychiatric:        Mood and Affect: Mood normal.     ED Results / Procedures / Treatments   Labs (all labs ordered are listed, but only abnormal results are displayed) Labs Reviewed  RESP PANEL BY RT-PCR (RSV, FLU A&B, COVID)  RVPGX2 - Abnormal; Notable for the following components:      Result Value   Resp Syncytial Virus by PCR POSITIVE (*)    All other components within normal limits  COMPREHENSIVE METABOLIC PANEL - Abnormal; Notable for the following components:   Glucose, Bld 131 (*)    Creatinine, Ser 1.06 (*)    Total Protein 6.4 (*)    GFR, Estimated 52 (*)    All other components within normal limits  TROPONIN I (HIGH SENSITIVITY) - Abnormal; Notable for the following components:   Troponin I (High Sensitivity) 469 (*)    All other components within normal limits  CBC  BRAIN NATRIURETIC PEPTIDE  TSH  TROPONIN I (HIGH SENSITIVITY)    EKG EKG Interpretation  Date/Time:  Saturday March 10 2022 23:02:40 EST Ventricular Rate:  113 PR Interval:  122 QRS Duration: 94 QT  Interval:  329 QTC Calculation: 452 R Axis:   90 Text Interpretation: Sinus tachycardia Atrial premature complexes Confirmed by Kommor, Madison ([id number]) on 03/11/2022 12:01:30 AM  Radiology DG Chest Port 1 View  Result Date: 03/11/2022 CLINICAL DATA:  Shortness of breath EXAM: PORTABLE CHEST 1 VIEW COMPARISON:  08/14/2021 FINDINGS: Large hiatal hernia is noted. Cardiac shadow is stable. Aortic calcifications are seen. Lungs are well aerated bilaterally. No focal infiltrate or effusion is seen. No bony abnormality is noted. IMPRESSION: Hiatal hernia.  No acute abnormality noted. Electronically Signed   By: Inez Catalina M.D.   On: 03/11/2022 00:00    Procedures .Critical Care  Performed by: Chuck Hint, PA-C Authorized  by: Chuck Hint, PA-C   Critical care provider statement:    Critical care time (minutes):  45   Critical care time was exclusive of:  Separately billable procedures and treating other patients   Critical care was necessary to treat or prevent imminent or life-threatening deterioration of the following conditions:  Respiratory failure   Critical care was time spent personally by me on the following activities:  Ordering and performing treatments and interventions, ordering and review of laboratory studies, ordering and review of radiographic studies, pulse oximetry, re-evaluation of patient's condition, review of old charts, obtaining history from patient or surrogate, examination of patient, evaluation of patient's response to treatment, discussions with consultants and development of treatment plan with patient or surrogate   I assumed direction of critical care for this patient from another provider in my specialty: no     Care discussed with: admitting provider      Medications Ordered in ED Medications  famotidine (PEPCID) tablet 20 mg (has no administration in time range)  levothyroxine (SYNTHROID) tablet 125 mcg (has no administration in time range)   pantoprazole (PROTONIX) EC tablet 40 mg (has no administration in time range)  methylPREDNISolone sodium succinate (SOLU-MEDROL) 40 mg/mL injection 20 mg (has no administration in time range)  ipratropium-albuterol (DUONEB) 0.5-2.5 (3) MG/3ML nebulizer solution 9 mL (9 mLs Nebulization Given 03/11/22 0041)  albuterol (PROVENTIL) (2.5 MG/3ML) 0.083% nebulizer solution 10 mg (10 mg Nebulization Given 03/11/22 0228)  LORazepam (ATIVAN) injection 0.5 mg (0.5 mg Intravenous Given 03/11/22 0228)    ED Course/ Medical Decision Making/ A&P Clinical Course as of 03/11/22 0305  Sun Mar 11, 2022  0014 Anion gap: 9 [JS]  0153 Troponin I (High Sensitivity)(!!): 469 [JS]    Clinical Course User Index [JS] Chuck Hint, PA-C                           Medical Decision Making Amount and/or Complexity of Data Reviewed Labs: ordered. Radiology: ordered.  Risk Prescription drug management.  Grainne Pierre Bali 81 y.o. presented today for 1 day history of shortness of breath. Working DDx that I considered at this time includes, but not limited to, asthma/COPD exacerbation, pneumonia, ACS, Aspirated FB, Arrhythmia, Tamponade, PNX, Sepsis, CHF.  Review of prior external notes: 01/09/22 Progress Note  Unique Tests and Interpretation:  - CXR: Hiatal hernia present.  No pleural effusions noted, no free air present, no pneumothorax present. - BMP: Glucose 131, the rest is unremarkable - BNP: 46.8 - EKG: Sinus tachycardia 113 bpm, no signs of T wave abnormalities, no blocks noted - Trop: 469 - CBC: Unremarkable - Resp Panel: RSV positive - TSH: 2.495  Discussion with Independent Historian: Neighbor  Discussion of Management of Tests: Hospitalist ([place])  Risk:   High: - hospitalization or escalation of hospital-level care  Risk Stratification Score: CURB-65: 1 (>65 yo)  Staffed with Teressa Lower, MD  R/o DDx: -PNA: no infiltrates on CXR, no fevers, no productive cough. CURB-65  is 1 so low likelihood PNA is the cause. -Aspirated FB: airway clear on CXR -Arrhythmia: not noted on EKG -Sepsis: Patient is tachycardic and tachypneic but there is no source of infection, therefore does not meet sepsis criteria -CHF: No pleural effusion, leg edema noted on exam and BNP was not elevated -ACS: no ECG changes however troponin is elevated but after speaking with Dr. Matilde Sprang we agreed this most likely due to demand ischemia vs ACS  Plan: Most  likely diagnosis at this time is asthma/COPD exacerbation. Patient's O2 dropped from 97 to 90 when exertional on exam and wheezing was present throughout bilateral lungs so triple duoneb was ordered to alleviate symptoms. Patient was educated on side effect of jitteriness from medication and was told she could request meds if jitteriness was unbearable. Mg and steroids were given by EMS so they were not given in the department.  On reassessment, patient reported improved breathing with duoneb treatment.  Wheezing was still noted in bilateral lungs on recheck.  Patient also verbalized feeling uncomfortable when moving around in the bed and that she was becoming more short of breath as she moved.  O2 sat ranged from 91-96 while in the room.  Resp panel was positive for RSV, which I highly suspect is the cause for the sinus symptoms.  Patient's troponin was 469 which we suspect is from demand ischemia due to the albuterol patient has been receiving and the patient's persistent shortness of breath. Patient also received ativan due to shaking from albuterol treatments. We will wait to see what the delta troponin is and also page the hospitalist for admission for shortness of breath after failed duoneb and CAT treatment.  Lorre Munroe, PA-C paged the hospitalist and patient was accepted.  Patient was updated on treatment plan and hospital admission and was accepting of plan.   Final Clinical Impression(s) / ED Diagnoses Final diagnoses:  None     Rx / DC Orders ED Discharge Orders     None         Elvina Sidle 03/11/22 0306    Teressa Lower, MD 03/11/22 2215

## 2022-03-12 DIAGNOSIS — J441 Chronic obstructive pulmonary disease with (acute) exacerbation: Secondary | ICD-10-CM | POA: Diagnosis not present

## 2022-03-12 LAB — RENAL FUNCTION PANEL
Albumin: 3.6 g/dL (ref 3.5–5.0)
Anion gap: 8 (ref 5–15)
BUN: 22 mg/dL (ref 8–23)
CO2: 22 mmol/L (ref 22–32)
Calcium: 8.9 mg/dL (ref 8.9–10.3)
Chloride: 109 mmol/L (ref 98–111)
Creatinine, Ser: 1.25 mg/dL — ABNORMAL HIGH (ref 0.44–1.00)
GFR, Estimated: 43 mL/min — ABNORMAL LOW (ref >60)
Glucose, Bld: 157 mg/dL — ABNORMAL HIGH (ref 70–99)
Phosphorus: 4.1 mg/dL (ref 2.5–4.6)
Potassium: 4.6 mmol/L (ref 3.5–5.1)
Sodium: 139 mmol/L (ref 135–145)

## 2022-03-12 LAB — CBC
HCT: 44.3 % (ref 36.0–46.0)
Hemoglobin: 14.3 g/dL (ref 12.0–15.0)
MCH: 30.5 pg (ref 26.0–34.0)
MCHC: 32.3 g/dL (ref 30.0–36.0)
MCV: 94.5 fL (ref 80.0–100.0)
Platelets: 160 10*3/uL (ref 150–400)
RBC: 4.69 MIL/uL (ref 3.87–5.11)
RDW: 13.9 % (ref 11.5–15.5)
WBC: 12.8 10*3/uL — ABNORMAL HIGH (ref 4.0–10.5)
nRBC: 0 % (ref 0.0–0.2)

## 2022-03-12 LAB — HEPARIN LEVEL (UNFRACTIONATED): Heparin Unfractionated: 0.37 IU/mL (ref 0.30–0.70)

## 2022-03-12 LAB — MAGNESIUM: Magnesium: 2.3 mg/dL (ref 1.7–2.4)

## 2022-03-12 MED ORDER — ACETAMINOPHEN 325 MG PO TABS
650.0000 mg | ORAL_TABLET | Freq: Four times a day (QID) | ORAL | Status: DC | PRN
  Administered 2022-03-12: 650 mg via ORAL
  Filled 2022-03-12: qty 2

## 2022-03-12 MED ORDER — EZETIMIBE 10 MG PO TABS
10.0000 mg | ORAL_TABLET | Freq: Every day | ORAL | Status: DC
  Administered 2022-03-12 – 2022-03-16 (×5): 10 mg via ORAL
  Filled 2022-03-12 (×5): qty 1

## 2022-03-12 MED ORDER — HYDRALAZINE HCL 25 MG PO TABS: 25.0000 mg | ORAL_TABLET | Freq: Three times a day (TID) | ORAL | Status: DC | PRN

## 2022-03-12 MED ORDER — HYDRALAZINE HCL 20 MG/ML IJ SOLN
10.0000 mg | INTRAMUSCULAR | Status: DC | PRN
  Administered 2022-03-12: 10 mg via INTRAVENOUS
  Filled 2022-03-12: qty 1

## 2022-03-12 MED ORDER — NIFEDIPINE ER OSMOTIC RELEASE 30 MG PO TB24
30.0000 mg | ORAL_TABLET | Freq: Every day | ORAL | Status: DC
  Administered 2022-03-12: 30 mg via ORAL
  Filled 2022-03-12 (×2): qty 1

## 2022-03-12 NOTE — Progress Notes (Signed)
Rounding Note    Patient Name: Sonia [name] Date of Encounter: 03/12/2022  [place] Cardiologist: [name], MD   Subjective   No acute events overnight.  Still coughing quite a bit and remains orthopneic.  Denies chest pain.  Inpatient Medications    Scheduled Meds:  aspirin EC  81 mg Oral Daily   famotidine  20 mg Oral QPC supper   levothyroxine  125 mcg Oral QAC breakfast   methylPREDNISolone (SOLU-MEDROL) injection  20 mg Intravenous Q12H   montelukast  10 mg Oral QHS   pantoprazole  40 mg Oral QAC breakfast   rosuvastatin  5 mg Oral Daily   Continuous Infusions:  azithromycin Stopped (03/11/22 0845)   cefTRIAXone (ROCEPHIN)  IV Stopped (03/11/22 0939)   heparin 850 Units/hr (03/12/22 0515)   PRN Meds: acetaminophen, albuterol, benzonatate   Vital Signs    Vitals:   03/11/22 2103 03/11/22 2303 03/12/22 0039 03/12/22 0502  BP: (!) 152/68 128/87 (!) 159/83 (!) 128/96  Pulse: (!) 126  (!) 116 (!) 112  Resp: (!) '21 20 20 '$ (!) 21  Temp: 98.2 F (36.8 C)  99.1 F (37.3 C) 98.1 F (36.7 C)  TempSrc: Oral  Oral Oral  SpO2: 100% 95% 93% 93%  Weight:      Height:        Intake/Output Summary (Last 24 hours) at 03/12/2022 0808 Last data filed at 03/12/2022 0300 Gross per 24 hour  Intake 557.98 ml  Output --  Net 557.98 ml      03/11/2022    5:28 PM 01/09/2022   11:41 AM 10/03/2021   10:37 AM  Last 3 Weights  Weight (lbs) 153 lb 6.4 oz 153 lb 9.6 oz 148 lb  Weight (kg) 69.582 kg 69.673 kg 67.132 kg      Telemetry    Sinus tachycardia - Personally Reviewed  ECG    NSR without acute ischemic changes - Personally Reviewed  Physical Exam   GEN: No acute distress.   Neck: No JVD Cardiac: RRR, no murmurs, rubs, or gallops.  Respiratory: Diffuse inspiratory and expiratory wheezes GI: Soft, nontender, non-distended  MS: No edema; No deformity. Neuro:  Nonfocal  Psych: Normal affect   Labs    High Sensitivity  Troponin:   Recent Labs  Lab 03/10/22 2318 03/11/22 0345 03/11/22 1135 03/11/22 1811  TROPONINIHS 469* 1,063* 264* 403*     Chemistry Recent Labs  Lab 03/10/22 2318 03/12/22 0540  NA 142 139  K 3.7 4.6  CL 107 109  CO2 26 22  GLUCOSE 131* 157*  BUN 20 22  CREATININE 1.06* 1.25*  CALCIUM 9.1 8.9  MG  --  2.3  PROT 6.4*  --   ALBUMIN 3.6 3.6  AST 25  --   ALT 14  --   ALKPHOS 52  --   BILITOT 1.0  --   GFRNONAA 52* 43*  ANIONGAP 9 8    Lipids  Recent Labs  Lab 03/11/22 1135  CHOL 236*  TRIG 46  HDL 90  LDLCALC 137*  CHOLHDL 2.6    Hematology Recent Labs  Lab 03/10/22 2318 03/12/22 0540  WBC 9.0 12.8*  RBC 4.70 4.69  HGB 14.6 14.3  HCT 44.3 44.3  MCV 94.3 94.5  MCH 31.1 30.5  MCHC 33.0 32.3  RDW 13.4 13.9  PLT 156 160   Thyroid  Recent Labs  Lab 03/10/22 2318  TSH 2.495    BNP Recent Labs  Lab 03/10/22 2318  BNP 46.8    DDimer No results for input(s): "DDIMER" in the last 168 hours.   Radiology    ECHOCARDIOGRAM COMPLETE  Result Date: 03/11/2022    ECHOCARDIOGRAM REPORT   Patient Name:   Sonia [name] Date of Exam: 03/11/2022 Medical Rec #:  [id number]                Height:       64.0 in Accession #:    [id number]               Weight:       153.6 lb Date of Birth:  February 22, 1941                BSA:          1.749 m Patient Age:    82 years                 BP:           96/79 mmHg Patient Gender: F                        HR:           107 bpm. Exam Location:  Inpatient Procedure: 2D Echo, Cardiac Doppler and Color Doppler STAT ECHO Indications:    Elevated Troponin  History:        Patient has prior history of Echocardiogram examinations, most                 recent 07/17/2015. CAD and Previous Myocardial Infarction; COPD.                 Breast cancer.  Sonographer:    Darlina Sicilian RDCS Referring Phys: [id number] DEBBY CROSLEY  Sonographer Comments: Hiatal hernia IMPRESSIONS  1. Left ventricular ejection fraction, by estimation, is >75%. The  left ventricle has hyperdynamic function. The left ventricle has no regional wall motion abnormalities. Left ventricular diastolic parameters are consistent with Grade I diastolic dysfunction (impaired relaxation).  2. Right ventricular systolic function is normal. The right ventricular size is normal. Tricuspid regurgitation signal is inadequate for assessing PA pressure.  3. The mitral valve is degenerative. No evidence of mitral valve regurgitation. Mild mitral stenosis. The mean mitral valve gradient is 4.5 mmHg.  4. The aortic valve is calcified. Aortic valve regurgitation is not visualized. Aortic valve sclerosis/calcification is present, without any evidence of aortic stenosis.  5. The inferior vena cava is normal in size with greater than 50% respiratory variability, suggesting right atrial pressure of 3 mmHg. Conclusion(s)/Recommendation(s): Normal for age. FINDINGS  Left Ventricle: Left ventricular ejection fraction, by estimation, is >75%. The left ventricle has hyperdynamic function. The left ventricle has no regional wall motion abnormalities. The left ventricular internal cavity size was small. There is no left  ventricular hypertrophy. Left ventricular diastolic parameters are consistent with Grade I diastolic dysfunction (impaired relaxation). Right Ventricle: The right ventricular size is normal. No increase in right ventricular wall thickness. Right ventricular systolic function is normal. Tricuspid regurgitation signal is inadequate for assessing PA pressure. Left Atrium: Left atrial size was normal in size. Right Atrium: Right atrial size was normal in size. Pericardium: Trivial pericardial effusion is present. Mitral Valve: The mitral valve is degenerative in appearance. There is moderate thickening of the mitral valve leaflet(s). There is moderate calcification of the mitral valve leaflet(s). Mild to moderate mitral annular calcification. No evidence of mitral valve regurgitation. Mild mitral  valve stenosis. MV  peak gradient, 10.0 mmHg. The mean mitral valve gradient is 4.5 mmHg. Tricuspid Valve: The tricuspid valve is grossly normal. Tricuspid valve regurgitation is not demonstrated. No evidence of tricuspid stenosis. Aortic Valve: The aortic valve is calcified. Aortic valve regurgitation is not visualized. Aortic valve sclerosis/calcification is present, without any evidence of aortic stenosis. Pulmonic Valve: The pulmonic valve was grossly normal. Pulmonic valve regurgitation is not visualized. No evidence of pulmonic stenosis. Aorta: The aortic root and ascending aorta are structurally normal, with no evidence of dilitation. Venous: The inferior vena cava is normal in size with greater than 50% respiratory variability, suggesting right atrial pressure of 3 mmHg. IAS/Shunts: No atrial level shunt detected by color flow Doppler.  LEFT VENTRICLE PLAX 2D LVIDd:         3.90 cm     Diastology LVIDs:         2.40 cm     LV e' medial:    5.55 cm/s LV PW:         0.90 cm     LV E/e' medial:  20.9 LV IVS:        0.90 cm     LV e' lateral:   6.09 cm/s LVOT diam:     2.00 cm     LV E/e' lateral: 19.0 LV SV:         68 LV SV Index:   39 LVOT Area:     3.14 cm  LV Volumes (MOD) LV vol d, MOD A2C: 50.5 ml LV vol d, MOD A4C: 75.5 ml LV vol s, MOD A2C: 12.4 ml LV vol s, MOD A4C: 18.8 ml LV SV MOD A2C:     38.1 ml LV SV MOD A4C:     75.5 ml LV SV MOD BP:      48.1 ml RIGHT VENTRICLE TAPSE (M-mode): 1.5 cm LEFT ATRIUM             Index        RIGHT ATRIUM           Index LA diam:        2.90 cm 1.66 cm/m   RA Area:     10.90 cm LA Vol (A2C):   38.0 ml 21.73 ml/m  RA Volume:   21.20 ml  12.12 ml/m LA Vol (A4C):   41.2 ml 23.56 ml/m LA Biplane Vol: 41.9 ml 23.96 ml/m  AORTIC VALVE LVOT Vmax:   96.20 cm/s LVOT Vmean:  67.500 cm/s LVOT VTI:    0.217 m  AORTA Ao Root diam: 2.50 cm Ao Asc diam:  2.60 cm MITRAL VALVE MV Area (PHT): 3.63 cm     SHUNTS MV Area VTI:   2.85 cm     Systemic VTI:  0.22 m MV Peak grad:   10.0 mmHg    Systemic Diam: 2.00 cm MV Mean grad:  4.5 mmHg MV Vmax:       1.58 m/s MV Vmean:      99.4 cm/s MV Decel Time: 209 msec MV E velocity: 116.00 cm/s MV A velocity: 150.00 cm/s MV E/A ratio:  0.77 Mary Scientist, physiological signed by Phineas Inches Signature Date/Time: 03/11/2022/8:42:21 AM    Final    DG Chest Port 1 View  Result Date: 03/11/2022 CLINICAL DATA:  Shortness of breath EXAM: PORTABLE CHEST 1 VIEW COMPARISON:  08/14/2021 FINDINGS: Large hiatal hernia is noted. Cardiac shadow is stable. Aortic calcifications are seen. Lungs are well aerated bilaterally. No focal infiltrate or effusion is seen. No  bony abnormality is noted. IMPRESSION: Hiatal hernia.  No acute abnormality noted. Electronically Signed   By: Inez Catalina M.D.   On: 03/11/2022 00:00    Cardiac Studies   TTE 03/11/22  1. Left ventricular ejection fraction, by estimation, is >75%. The left  ventricle has hyperdynamic function. The left ventricle has no regional  wall motion abnormalities. Left ventricular diastolic parameters are  consistent with Grade I diastolic  dysfunction (impaired relaxation).   2. Right ventricular systolic function is normal. The right ventricular  size is normal. Tricuspid regurgitation signal is inadequate for assessing  PA pressure.   3. The mitral valve is degenerative. No evidence of mitral valve  regurgitation. Mild mitral stenosis. The mean mitral valve gradient is 4.5  mmHg.   4. The aortic valve is calcified. Aortic valve regurgitation is not  visualized. Aortic valve sclerosis/calcification is present, without any  evidence of aortic stenosis.   5. The inferior vena cava is normal in size with greater than 50%  respiratory variability, suggesting right atrial pressure of 3 mmHg.   Cor angio 2021 1. Patent stent proximal to mid RCA with minimal restenosis 2. Moderate mid LAD stenosis, unchanged from last cath. This does not appear to be flow limiting.  3. Mild luminal  irregularities in the moderate caliber, non-dominant Circumflex artery.  4. Normal LV systolic function  Patient Profile     Marua Zaynah Chawla is a 82 y.o. female with a hx of CAD s/p PCI RCA, COPD, asthma, GERD, hypothyroidism, breast CA who is being seen 03/11/2022 for the evaluation of elevated troponin at the request of Dr. Marthenia Rolling.   Assessment & Plan     Acute coronary syndrome:  Likely type II MI from RSV infection.  Cont ASA, hep gtt.  No BB given diffuse wheezing; tachycardia likely from RSV infection.  Discussed possible cor angio to evaluate further; will need to be able to lay supine which is not possible today.  Plan on invasive assessment ideally prior to discharge as informed by respiratory status.   Dyspnea:  Due to RSV infection/COPD, per primary team. HL:  Intolerant of higher doses of statin.  Start Zetia.      For questions or updates, please contact [place] Please consult [email or web address] for contact info under        Signed, Early Osmond, MD  03/12/2022, 8:08 AM

## 2022-03-12 NOTE — Progress Notes (Signed)
ANTICOAGULATION CONSULT NOTE  Pharmacy Consult for heparin Indication: chest pain/ACS  Allergies  Allergen Reactions   Adenosine Anaphylaxis   Contrast Media [Iodinated Contrast Media] Anaphylaxis   Influenza Virus Vaccine Anaphylaxis   Nitrofurantoin Itching   Milk (Cow)     Other reaction(s): Other Other Reaction: Other reaction   Breztri Aerosphere [Budeson-Glycopyrrol-Formoterol] Other (See Comments)    Urinary retention.   Codeine Sulfate Nausea And Vomiting   Latex Other (See Comments)    Skin irritation    Patient Measurements: Height: '5\' 4"'$  (162.6 cm) Weight: 69.6 kg (153 lb 6.4 oz) IBW/kg (Calculated) : 54.7 Heparin Dosing Weight: 70 kg  Vital Signs: Temp: 97.7 F (36.5 C) (01/01 0811) Temp Source: Oral (01/01 0811) BP: 108/78 (01/01 0811) Pulse Rate: 94 (01/01 0811)  Labs: Recent Labs    03/10/22 2318 03/11/22 0345 03/11/22 1135 03/11/22 1400 03/11/22 1811 03/11/22 2203 03/12/22 0540  HGB 14.6  --   --   --   --   --  14.3  HCT 44.3  --   --   --   --   --  44.3  PLT 156  --   --   --   --   --  160  HEPARINUNFRC  --   --   --  0.60  --  0.49 0.37  CREATININE 1.06*  --   --   --   --   --  1.25*  TROPONINIHS 469* 1,063* 264*  --  403*  --   --      Estimated Creatinine Clearance: 32.7 mL/min (A) (by C-G formula based on SCr of 1.25 mg/dL (H)).   Medical History: Past Medical History:  Diagnosis Date   Arthritis    Asthma    daily and prn inhalers; states good control currently   Breast cancer ([place])    right   CAD S/P percutaneous coronary angioplasty 07/15/2016   COPD (chronic obstructive pulmonary disease) (HCC)    Dyspnea    occ with exertion   Fibromyalgia    GERD (gastroesophageal reflux disease)    no current med.   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   Current Outpatient Medications on File Prior to Encounter  Medication Sig Dispense Refill   albuterol (PROVENTIL) (2.5 MG/3ML) 0.083% nebulizer solution Take 3 mLs (2.5 mg total) by nebulization every 6 (six) hours as needed for wheezing or shortness of breath. (Patient taking differently: Take 2.5 mg by nebulization every 6 (six) hours as needed for wheezing or shortness of breath. Use 1 nebulizer (2.5 mg) (scheduled) in the morning & may use every 6 hours as needed for wheezing/shortness of breath.) 360 mL 11   albuterol (VENTOLIN HFA) 108 (90 Base) MCG/ACT inhaler 2 puffs every 4 hours if needed 18 g 11   calcium carbonate (TUMS - DOSED IN MG ELEMENTAL CALCIUM) 500 MG chewable tablet Chew 500 mg by mouth daily as needed for indigestion or heartburn.     cetirizine (  ZYRTEC) 10 MG tablet Take 10 mg by mouth in the morning.     Cholecalciferol 25 MCG (1000 UT) capsule Take 1,000 Units by mouth every other day.     Cyanocobalamin (B-12) 5000 MCG CAPS Place 5,000 mcg under the tongue every other day.     famotidine (PEPCID) 20 MG tablet One after supper 30 tablet 11   levothyroxine (SYNTHROID) 125 MCG tablet Take 125 mcg by mouth daily before breakfast.     montelukast (SINGULAIR) 10 MG tablet Take 1 tablet (10 mg total) by mouth at bedtime. 30 tablet 11   nitroGLYCERIN (NITROSTAT) 0.4 MG SL tablet Place 0.4 mg under the tongue every 5 (five) minutes x 3 doses as needed for chest pain.     pantoprazole (PROTONIX) 40 MG tablet Take 1 tablet (40 mg total) by mouth daily. Take 30-60 min before first meal of the day 30 tablet 2   predniSONE (DELTASONE) 20 MG tablet Take 1 tablet (20 mg total) by mouth daily with breakfast.  (Patient taking differently: Take 20-40 mg by mouth See admin instructions. Take 2 tablets (40 mg) by mouth in the morning & take 1 tablet (20 mg) by mouth in the evening.) 5 tablet 0   SYMBICORT 160-4.5 MCG/ACT inhaler Inhale 2 puffs into the lungs in the morning and at bedtime. 33 g 3     Assessment: 82 y.o. female with SOB and elevated troponin. Cardiology consulted for NSTEMI, plan for LHC once patient is able to lay flat. Trop on downward trend.   Heparin level therapeutic at 0.37 while on heparin 850u/hr. CBC wnl. No issues with infusion or bleeding noted.   Goal of Therapy:  Heparin level 0.3-0.7 units/ml Monitor platelets by anticoagulation protocol: Yes   Plan:  Continue heparin at 850u/hr.  Check heparin level daily  Monitor H&H, s/sx of bleeding daily   Francena Hanly, PharmD Pharmacy Resident  03/12/2022 8:30 AM

## 2022-03-12 NOTE — Progress Notes (Signed)
Some waxing and waning confusion through the night. Thought nurse tech abandoned her, keeps asking what she needs to be doing and where she is going, mentioned something about going to work. Now A&Ox3 (disoriented to situation). Became more impulsive getting out of bed. Pulled off all attachments including PIV with heparin running. Unsure how long IV heparin wasn't infusing but suspect not too long given CCMD called about tele leads off. Still frequent coughing. Utilizing nebs and PRN tessalon. Complains of throat feeling dry and achy.

## 2022-03-12 NOTE — Progress Notes (Signed)
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  She also has COPD/asthma, fibromyalgia, hypothyroidism and history of malignant melanoma.  Patient developed viral syndrome about a week prior to presentation.  Workup revealed positive RSV, elevated cardiac troponin but on the downward trend.  Chest x-ray did not reveal any acute abnormality.  03/11/2022: Patient seen.  Above summary noted.  Patient is currently on IV azithromycin, ceftriaxone and heparin drip.  Patient is also on IV Solu-Medrol and nebs DuoNeb.  Will add Singulair and incentive spirometry.  Patient has been noted to have intermittently low normal blood pressure.    03/12/2022: Patient seen alongside patient's nurse.  Patient continues to improve slowly.  Discussed CODE STATUS with patient.  Patient elects to be DO NOT RESUSCITATE.  Cardiology team is directing care.  Assessment & Plan:   Principal Problem:   COPD with acute exacerbation ([place]) Active Problems:   Dyslipidemia   COPD with asthma ([place])   Hypothyroidism   History of melanoma   Severe asthma with acute exacerbation   CAD S/P percutaneous coronary angioplasty   Carcinoma of upper-outer quadrant of right breast in female, estrogen receptor positive (HCC)   Elevated troponin   RSV (respiratory syncytial virus pneumonia)   Acute bronchiolitis due to respiratory syncytial virus (RSV)   COPD with acute exacerbation (HCC)/ Severe asthma with acute exacerbation  RSV (respiratory syncytial virus pneumonia) -IV steroids ordered 20 mg IV every 12.  Patient would benefit from a higher dosage given her degree of wheezing however, she is quite resistant to high-dose of  steroid.  She was told we will try the 20 mg every 12 but it may need to be increased.   -Discontinue IV Rocephin and azithromycin.  Patient is worried about C. difficile colitis. -Continue nebulizer treatment.    Elevated troponin/CAD/concerns for NSTEMI: -Elevated troponin. -Patient continues to report shortness of breath. -EKG does not reveal ischemic changes. -Patient is currently being managed for NSTEMI.  -Patient remains on heparin drip.    Dyslipidemia -Continue Crestor.      Hypothyroidism/h/o Hashimoto's -Stable, Synthroid 112 mcg reordered   PMR/giant cell arteritis -Maintained on chronic p.o. steroids   Fibromyalgia H/o malignant melanoma H/o sclerosing adenitis breast  DVT prophylaxis: Heparin drip Code Status: Full code Family Communication:  Disposition Plan: DC depend on hospital course.   Consultants:  Cardiology  Procedures:  None  Antimicrobials:  IV ceftriaxone discontinued. IV azithromycin discontinued.   Subjective: Reports shortness of breath. No chest pain.  Objective: Vitals:   03/12/22 0039 03/12/22 0502 03/12/22 0811 03/12/22 1405  BP: (!) 159/83 (!) 128/96 108/78 (!) 140/96  Pulse: (!) 116 (!) 112 94 98  Resp: 20 (!) '21 20 16  '$ Temp: 99.1 F (37.3 C) 98.1 F (36.7 C) 97.7 F (36.5 C) 98.7 F (37.1 C)  TempSrc: Oral Oral Oral Oral  SpO2: 93% 93% 94% 94%  Weight:      Height:        Intake/Output Summary (Last 24 hours) at 03/12/2022 1704 Last data filed at 03/12/2022 1100 Gross per 24 hour  Intake 538.52 ml  Output --  Net 538.52 ml    Filed Weights   03/11/22 1728  Weight: 69.6 kg  Examination:  General exam: Appears calm and comfortable  Respiratory system: Decreased air entry.  Expiratory wheeze.   Cardiovascular system: S1 & S2 heard. Gastrointestinal system: Abdomen is soft and nontender.  Central nervous system: Awake and alert.  Patient moves all extremities.   Extremities: Fullness of the ankle.  Data  Reviewed: I have personally reviewed following labs and imaging studies  CBC: Recent Labs  Lab 03/10/22 2318 03/12/22 0540  WBC 9.0 12.8*  HGB 14.6 14.3  HCT 44.3 44.3  MCV 94.3 94.5  PLT 156 761    Basic Metabolic Panel: Recent Labs  Lab 03/10/22 2318 03/12/22 0540  NA 142 139  K 3.7 4.6  CL 107 109  CO2 26 22  GLUCOSE 131* 157*  BUN 20 22  CREATININE 1.06* 1.25*  CALCIUM 9.1 8.9  MG  --  2.3  PHOS  --  4.1    GFR: Estimated Creatinine Clearance: 32.7 mL/min (A) (by C-G formula based on SCr of 1.25 mg/dL (H)). Liver Function Tests: Recent Labs  Lab 03/10/22 2318 03/12/22 0540  AST 25  --   ALT 14  --   ALKPHOS 52  --   BILITOT 1.0  --   PROT 6.4*  --   ALBUMIN 3.6 3.6    No results for input(s): "LIPASE", "AMYLASE" in the last 168 hours. No results for input(s): "AMMONIA" in the last 168 hours. Coagulation Profile: No results for input(s): "INR", "PROTIME" in the last 168 hours. Cardiac Enzymes: No results for input(s): "CKTOTAL", "CKMB", "CKMBINDEX", "TROPONINI" in the last 168 hours. BNP (last 3 results) No results for input(s): "PROBNP" in the last 8760 hours. HbA1C: No results for input(s): "HGBA1C" in the last 72 hours. CBG: No results for input(s): "GLUCAP" in the last 168 hours. Lipid Profile: Recent Labs    03/11/22 1135  CHOL 236*  HDL 90  LDLCALC 137*  TRIG 46  CHOLHDL 2.6    Thyroid Function Tests: Recent Labs    03/10/22 2318  TSH 2.495    Anemia Panel: No results for input(s): "VITAMINB12", "FOLATE", "FERRITIN", "TIBC", "IRON", "RETICCTPCT" in the last 72 hours. Urine analysis:    Component Value Date/Time   COLORURINE YELLOW 08/14/2021 [place] 08/14/2021 1604   LABSPEC 1.020 08/14/2021 1604   PHURINE 5.0 08/14/2021 [place] 08/14/2021 1604   GLUCOSEU NEGATIVE 11/28/2010 1422   HGBUR NEGATIVE 08/14/2021 [place] 08/14/2021 [place] 08/14/2021  1604   PROTEINUR NEGATIVE 08/14/2021 1604   UROBILINOGEN 0.2 11/28/2010 1422   NITRITE NEGATIVE 08/14/2021 1604   LEUKOCYTESUR NEGATIVE 08/14/2021 1604   Sepsis Labs: '@LABRCNTIP'$ (procalcitonin:4,lacticidven:4)  ) Recent Results (from the past 240 hour(s))  Resp panel by RT-PCR (RSV, Flu A&B, Covid) Anterior Nasal Swab     Status: Abnormal   Collection Time: 03/10/22 11:13 PM   Specimen: Anterior Nasal Swab  Result Value Ref Range Status   SARS Coronavirus 2 by RT PCR NEGATIVE NEGATIVE Final    Comment: (NOTE) SARS-CoV-2 target nucleic acids are NOT DETECTED.  The SARS-CoV-2 RNA is generally detectable in upper respiratory specimens during the acute phase of infection. The lowest concentration of SARS-CoV-2 viral copies this assay can detect is 138 copies/mL. A negative result does not preclude SARS-Cov-2 infection and should not be used as the sole basis for treatment or other patient management decisions. A negative result may occur with  improper specimen collection/handling, submission of specimen other than nasopharyngeal swab, presence of  viral mutation(s) within the areas targeted by this assay, and inadequate number of viral copies(<138 copies/mL). A negative result must be combined with clinical observations, patient history, and epidemiological information. The expected result is Negative.  Fact Sheet for Patients:  EntrepreneurPulse.com.au  Fact Sheet for Healthcare Providers:  IncredibleEmployment.be  This test is no t yet approved or cleared by the [place] FDA and  has been authorized for detection and/or diagnosis of SARS-CoV-2 by FDA under an Emergency Use Authorization (EUA). This EUA will remain  in effect (meaning this test can be used) for the duration of the COVID-19 declaration under Section 564(b)(1) of the Act, 21 U.S.C.section 360bbb-3(b)(1), unless the authorization is terminated  or revoked sooner.        Influenza A by PCR NEGATIVE NEGATIVE Final   Influenza B by PCR NEGATIVE NEGATIVE Final    Comment: (NOTE) The Xpert Xpress SARS-CoV-2/FLU/RSV plus assay is intended as an aid in the diagnosis of influenza from Nasopharyngeal swab specimens and should not be used as a sole basis for treatment. Nasal washings and aspirates are unacceptable for Xpert Xpress SARS-CoV-2/FLU/RSV testing.  Fact Sheet for Patients: EntrepreneurPulse.com.au  Fact Sheet for Healthcare Providers: IncredibleEmployment.be  This test is not yet approved or cleared by the [place] FDA and has been authorized for detection and/or diagnosis of SARS-CoV-2 by FDA under an Emergency Use Authorization (EUA). This EUA will remain in effect (meaning this test can be used) for the duration of the COVID-19 declaration under Section 564(b)(1) of the Act, 21 U.S.C. section 360bbb-3(b)(1), unless the authorization is terminated or revoked.     Resp Syncytial Virus by PCR POSITIVE (A) NEGATIVE Final    Comment: (NOTE) Fact Sheet for Patients: EntrepreneurPulse.com.au  Fact Sheet for Healthcare Providers: IncredibleEmployment.be  This test is not yet approved or cleared by the [place] FDA and has been authorized for detection and/or diagnosis of SARS-CoV-2 by FDA under an Emergency Use Authorization (EUA). This EUA will remain in effect (meaning this test can be used) for the duration of the COVID-19 declaration under Section 564(b)(1) of the Act, 21 U.S.C. section 360bbb-3(b)(1), unless the authorization is terminated or revoked.  Performed at [facility], [place] [address], [place], [place] [address]          Radiology Studies: ECHOCARDIOGRAM COMPLETE  Result Date: 03/11/2022    ECHOCARDIOGRAM REPORT   Patient Name:   SHEREDA GRAW Date of Exam: 03/11/2022 Medical Rec #:  [id number]                Height:        64.0 in Accession #:    [id number]               Weight:       153.6 lb Date of Birth:  05-Oct-1940                BSA:          1.749 m Patient Age:    19 years                 BP:           96/79 mmHg Patient Gender: F                        HR:           107 bpm. Exam Location:  Inpatient Procedure: 2D Echo, Cardiac Doppler and Color Doppler STAT ECHO  Indications:    Elevated Troponin  History:        Patient has prior history of Echocardiogram examinations, most                 recent 07/17/2015. CAD and Previous Myocardial Infarction; COPD.                 Breast cancer.  Sonographer:    Darlina Sicilian RDCS Referring Phys: [phone] DEBBY CROSLEY  Sonographer Comments: Hiatal hernia IMPRESSIONS  1. Left ventricular ejection fraction, by estimation, is >75%. The left ventricle has hyperdynamic function. The left ventricle has no regional wall motion abnormalities. Left ventricular diastolic parameters are consistent with Grade I diastolic dysfunction (impaired relaxation).  2. Right ventricular systolic function is normal. The right ventricular size is normal. Tricuspid regurgitation signal is inadequate for assessing PA pressure.  3. The mitral valve is degenerative. No evidence of mitral valve regurgitation. Mild mitral stenosis. The mean mitral valve gradient is 4.5 mmHg.  4. The aortic valve is calcified. Aortic valve regurgitation is not visualized. Aortic valve sclerosis/calcification is present, without any evidence of aortic stenosis.  5. The inferior vena cava is normal in size with greater than 50% respiratory variability, suggesting right atrial pressure of 3 mmHg. Conclusion(s)/Recommendation(s): Normal for age. FINDINGS  Left Ventricle: Left ventricular ejection fraction, by estimation, is >75%. The left ventricle has hyperdynamic function. The left ventricle has no regional wall motion abnormalities. The left ventricular internal cavity size was small. There is no left  ventricular hypertrophy. Left  ventricular diastolic parameters are consistent with Grade I diastolic dysfunction (impaired relaxation). Right Ventricle: The right ventricular size is normal. No increase in right ventricular wall thickness. Right ventricular systolic function is normal. Tricuspid regurgitation signal is inadequate for assessing PA pressure. Left Atrium: Left atrial size was normal in size. Right Atrium: Right atrial size was normal in size. Pericardium: Trivial pericardial effusion is present. Mitral Valve: The mitral valve is degenerative in appearance. There is moderate thickening of the mitral valve leaflet(s). There is moderate calcification of the mitral valve leaflet(s). Mild to moderate mitral annular calcification. No evidence of mitral valve regurgitation. Mild mitral valve stenosis. MV peak gradient, 10.0 mmHg. The mean mitral valve gradient is 4.5 mmHg. Tricuspid Valve: The tricuspid valve is grossly normal. Tricuspid valve regurgitation is not demonstrated. No evidence of tricuspid stenosis. Aortic Valve: The aortic valve is calcified. Aortic valve regurgitation is not visualized. Aortic valve sclerosis/calcification is present, without any evidence of aortic stenosis. Pulmonic Valve: The pulmonic valve was grossly normal. Pulmonic valve regurgitation is not visualized. No evidence of pulmonic stenosis. Aorta: The aortic root and ascending aorta are structurally normal, with no evidence of dilitation. Venous: The inferior vena cava is normal in size with greater than 50% respiratory variability, suggesting right atrial pressure of 3 mmHg. IAS/Shunts: No atrial level shunt detected by color flow Doppler.  LEFT VENTRICLE PLAX 2D LVIDd:         3.90 cm     Diastology LVIDs:         2.40 cm     LV e' medial:    5.55 cm/s LV PW:         0.90 cm     LV E/e' medial:  20.9 LV IVS:        0.90 cm     LV e' lateral:   6.09 cm/s LVOT diam:     2.00 cm     LV E/e' lateral: 19.0 LV SV:  68 LV SV Index:   39 LVOT Area:      3.14 cm  LV Volumes (MOD) LV vol d, MOD A2C: 50.5 ml LV vol d, MOD A4C: 75.5 ml LV vol s, MOD A2C: 12.4 ml LV vol s, MOD A4C: 18.8 ml LV SV MOD A2C:     38.1 ml LV SV MOD A4C:     75.5 ml LV SV MOD BP:      48.1 ml RIGHT VENTRICLE TAPSE (M-mode): 1.5 cm LEFT ATRIUM             Index        RIGHT ATRIUM           Index LA diam:        2.90 cm 1.66 cm/m   RA Area:     10.90 cm LA Vol (A2C):   38.0 ml 21.73 ml/m  RA Volume:   21.20 ml  12.12 ml/m LA Vol (A4C):   41.2 ml 23.56 ml/m LA Biplane Vol: 41.9 ml 23.96 ml/m  AORTIC VALVE LVOT Vmax:   96.20 cm/s LVOT Vmean:  67.500 cm/s LVOT VTI:    0.217 m  AORTA Ao Root diam: 2.50 cm Ao Asc diam:  2.60 cm MITRAL VALVE MV Area (PHT): 3.63 cm     SHUNTS MV Area VTI:   2.85 cm     Systemic VTI:  0.22 m MV Peak grad:  10.0 mmHg    Systemic Diam: 2.00 cm MV Mean grad:  4.5 mmHg MV Vmax:       1.58 m/s MV Vmean:      99.4 cm/s MV Decel Time: 209 msec MV E velocity: 116.00 cm/s MV A velocity: 150.00 cm/s MV E/A ratio:  0.77 Mary Scientist, physiological signed by Phineas Inches Signature Date/Time: 03/11/2022/8:42:21 AM    Final    DG Chest Port 1 View  Result Date: 03/11/2022 CLINICAL DATA:  Shortness of breath EXAM: PORTABLE CHEST 1 VIEW COMPARISON:  08/14/2021 FINDINGS: Large hiatal hernia is noted. Cardiac shadow is stable. Aortic calcifications are seen. Lungs are well aerated bilaterally. No focal infiltrate or effusion is seen. No bony abnormality is noted. IMPRESSION: Hiatal hernia.  No acute abnormality noted. Electronically Signed   By: Inez Catalina M.D.   On: 03/11/2022 00:00        Scheduled Meds:  aspirin EC  81 mg Oral Daily   ezetimibe  10 mg Oral Daily   famotidine  20 mg Oral QPC supper   levothyroxine  125 mcg Oral QAC breakfast   methylPREDNISolone (SOLU-MEDROL) injection  20 mg Intravenous Q12H   montelukast  10 mg Oral QHS   pantoprazole  40 mg Oral QAC breakfast   rosuvastatin  5 mg Oral Daily   Continuous Infusions:  heparin 850  Units/hr (03/12/22 1100)     LOS: 1 day    Time spent: 35 minutes.    Dana Allan, MD  [facility] Pager #: [phone] 7PM-7AM contact night coverage as above

## 2022-03-13 DIAGNOSIS — J441 Chronic obstructive pulmonary disease with (acute) exacerbation: Secondary | ICD-10-CM | POA: Diagnosis not present

## 2022-03-13 DIAGNOSIS — I2510 Atherosclerotic heart disease of native coronary artery without angina pectoris: Secondary | ICD-10-CM | POA: Diagnosis not present

## 2022-03-13 DIAGNOSIS — J210 Acute bronchiolitis due to respiratory syncytial virus: Secondary | ICD-10-CM | POA: Diagnosis not present

## 2022-03-13 DIAGNOSIS — J4489 Other specified chronic obstructive pulmonary disease: Secondary | ICD-10-CM

## 2022-03-13 DIAGNOSIS — I10 Essential (primary) hypertension: Secondary | ICD-10-CM

## 2022-03-13 DIAGNOSIS — R7989 Other specified abnormal findings of blood chemistry: Secondary | ICD-10-CM | POA: Diagnosis not present

## 2022-03-13 DIAGNOSIS — I214 Non-ST elevation (NSTEMI) myocardial infarction: Secondary | ICD-10-CM

## 2022-03-13 DIAGNOSIS — C50411 Malignant neoplasm of upper-outer quadrant of right female breast: Secondary | ICD-10-CM | POA: Diagnosis not present

## 2022-03-13 DIAGNOSIS — J121 Respiratory syncytial virus pneumonia: Secondary | ICD-10-CM

## 2022-03-13 DIAGNOSIS — R000 Tachycardia, unspecified: Secondary | ICD-10-CM

## 2022-03-13 DIAGNOSIS — Z170 Estrogen receptor positive status [ER+]: Secondary | ICD-10-CM

## 2022-03-13 DIAGNOSIS — Z85820 Personal history of malignant melanoma of skin: Secondary | ICD-10-CM

## 2022-03-13 DIAGNOSIS — E785 Hyperlipidemia, unspecified: Secondary | ICD-10-CM

## 2022-03-13 LAB — BASIC METABOLIC PANEL
Anion gap: 10 (ref 5–15)
BUN: 21 mg/dL (ref 8–23)
CO2: 25 mmol/L (ref 22–32)
Calcium: 8.8 mg/dL — ABNORMAL LOW (ref 8.9–10.3)
Chloride: 106 mmol/L (ref 98–111)
Creatinine, Ser: 1.1 mg/dL — ABNORMAL HIGH (ref 0.44–1.00)
GFR, Estimated: 50 mL/min — ABNORMAL LOW (ref >60)
Glucose, Bld: 145 mg/dL — ABNORMAL HIGH (ref 70–99)
Potassium: 4.1 mmol/L (ref 3.5–5.1)
Sodium: 141 mmol/L (ref 135–145)

## 2022-03-13 LAB — CBC
HCT: 41.2 % (ref 36.0–46.0)
Hemoglobin: 14 g/dL (ref 12.0–15.0)
MCH: 31 pg (ref 26.0–34.0)
MCHC: 34 g/dL (ref 30.0–36.0)
MCV: 91.4 fL (ref 80.0–100.0)
Platelets: 153 10*3/uL (ref 150–400)
RBC: 4.51 MIL/uL (ref 3.87–5.11)
RDW: 13.9 % (ref 11.5–15.5)
WBC: 9.2 10*3/uL (ref 4.0–10.5)
nRBC: 0 % (ref 0.0–0.2)

## 2022-03-13 LAB — HEPARIN LEVEL (UNFRACTIONATED): Heparin Unfractionated: 0.61 IU/mL (ref 0.30–0.70)

## 2022-03-13 MED ORDER — REVEFENACIN 175 MCG/3ML IN SOLN
175.0000 ug | Freq: Every day | RESPIRATORY_TRACT | Status: DC
  Administered 2022-03-13 – 2022-03-16 (×4): 175 ug via RESPIRATORY_TRACT
  Filled 2022-03-13 (×3): qty 3

## 2022-03-13 MED ORDER — MOMETASONE FURO-FORMOTEROL FUM 200-5 MCG/ACT IN AERO
2.0000 | INHALATION_SPRAY | Freq: Two times a day (BID) | RESPIRATORY_TRACT | Status: DC
  Administered 2022-03-13 – 2022-03-14 (×3): 2 via RESPIRATORY_TRACT
  Filled 2022-03-13: qty 8.8

## 2022-03-13 MED ORDER — METOPROLOL TARTRATE 25 MG PO TABS: 25.0000 mg | ORAL_TABLET | Freq: Two times a day (BID) | ORAL | Status: DC

## 2022-03-13 MED ORDER — AMLODIPINE BESYLATE 5 MG PO TABS: 5.0000 mg | ORAL_TABLET | Freq: Every day | ORAL | Status: DC

## 2022-03-13 MED ORDER — IPRATROPIUM-ALBUTEROL 0.5-2.5 (3) MG/3ML IN SOLN
3.0000 mL | RESPIRATORY_TRACT | Status: DC | PRN
  Administered 2022-03-14 (×2): 3 mL via RESPIRATORY_TRACT
  Filled 2022-03-13: qty 3

## 2022-03-13 MED ORDER — AMLODIPINE BESYLATE 10 MG PO TABS
10.0000 mg | ORAL_TABLET | Freq: Every day | ORAL | Status: DC
  Administered 2022-03-13: 10 mg via ORAL
  Filled 2022-03-13: qty 1

## 2022-03-13 MED ORDER — METOPROLOL TARTRATE 5 MG/5ML IV SOLN: 2.5000 mg | INTRAVENOUS | Status: DC | PRN

## 2022-03-13 MED ORDER — METOPROLOL TARTRATE 12.5 MG HALF TABLET
12.5000 mg | ORAL_TABLET | Freq: Two times a day (BID) | ORAL | Status: DC
  Administered 2022-03-13 – 2022-03-14 (×4): 12.5 mg via ORAL
  Filled 2022-03-13 (×4): qty 1

## 2022-03-13 NOTE — Progress Notes (Addendum)
Rounding Note    Patient Name: Mireille Lacombe Date of Encounter: 03/13/2022  [place] Cardiologist: [name], MD   Subjective   Laying in bed this morning, feels overwhelmed. Neighbor at the bedside.   Inpatient Medications    Scheduled Meds:  aspirin EC  81 mg Oral Daily   ezetimibe  10 mg Oral Daily   famotidine  20 mg Oral QPC supper   levothyroxine  125 mcg Oral QAC breakfast   methylPREDNISolone (SOLU-MEDROL) injection  20 mg Intravenous Q12H   mometasone-formoterol  2 puff Inhalation BID   montelukast  10 mg Oral QHS   NIFEdipine  30 mg Oral Daily   pantoprazole  40 mg Oral QAC breakfast   rosuvastatin  5 mg Oral Daily   Continuous Infusions:  heparin 850 Units/hr (03/13/22 0401)   PRN Meds: acetaminophen, albuterol, benzonatate, hydrALAZINE   Vital Signs    Vitals:   03/12/22 2020 03/13/22 0017 03/13/22 0408 03/13/22 0807  BP: 132/82 (!) 147/113 (!) 143/89 (!) 155/107  Pulse: (!) 121 81 (!) 110 (!) 102  Resp: '16 16 18 20  '$ Temp: 98 F (36.7 C) 97.7 F (36.5 C) 98.1 F (36.7 C) 98 F (36.7 C)  TempSrc: Oral Oral Oral Oral  SpO2: 95% 94% 92% 93%  Weight:      Height:        Intake/Output Summary (Last 24 hours) at 03/13/2022 0844 Last data filed at 03/13/2022 0401 Gross per 24 hour  Intake 474.19 ml  Output --  Net 474.19 ml      03/11/2022    5:28 PM 01/09/2022   11:41 AM 10/03/2021   10:37 AM  Last 3 Weights  Weight (lbs) 153 lb 6.4 oz 153 lb 9.6 oz 148 lb  Weight (kg) 69.582 kg 69.673 kg 67.132 kg      Telemetry    Sinus Tachycardia, PACs - Personally Reviewed  ECG    No new tracing  Physical Exam   GEN: No acute distress. Slightly winded  Neck: No JVD Cardiac: Tachycardia, no murmurs, rubs, or gallops.  Respiratory: Expiratory wheezing  GI: Soft, nontender, non-distended  MS: No edema; No deformity. Neuro:  Nonfocal  Psych: Normal affect   Labs    High Sensitivity Troponin:   Recent Labs   Lab 03/10/22 2318 03/11/22 0345 03/11/22 1135 03/11/22 1811  TROPONINIHS 469* 1,063* 264* 403*     Chemistry Recent Labs  Lab 03/10/22 2318 03/12/22 0540 03/13/22 0259  NA 142 139 141  K 3.7 4.6 4.1  CL 107 109 106  CO2 '26 22 25  '$ GLUCOSE 131* 157* 145*  BUN '20 22 21  '$ CREATININE 1.06* 1.25* 1.10*  CALCIUM 9.1 8.9 8.8*  MG  --  2.3  --   PROT 6.4*  --   --   ALBUMIN 3.6 3.6  --   AST 25  --   --   ALT 14  --   --   ALKPHOS 52  --   --   BILITOT 1.0  --   --   GFRNONAA 52* 43* 50*  ANIONGAP '9 8 10    '$ Lipids  Recent Labs  Lab 03/11/22 1135  CHOL 236*  TRIG 46  HDL 90  LDLCALC 137*  CHOLHDL 2.6    Hematology Recent Labs  Lab 03/10/22 2318 03/12/22 0540 03/13/22 0259  WBC 9.0 12.8* 9.2  RBC 4.70 4.69 4.51  HGB 14.6 14.3 14.0  HCT 44.3 44.3 41.2  MCV 94.3  94.5 91.4  MCH 31.1 30.5 31.0  MCHC 33.0 32.3 34.0  RDW 13.4 13.9 13.9  PLT 156 160 153   Thyroid  Recent Labs  Lab 03/10/22 2318  TSH 2.495    BNP Recent Labs  Lab 03/10/22 2318  BNP 46.8    DDimer No results for input(s): "DDIMER" in the last 168 hours.   Radiology    No results found.  Cardiac Studies   TTE 03/11/22   1. Left ventricular ejection fraction, by estimation, is >75%. The left  ventricle has hyperdynamic function. The left ventricle has no regional  wall motion abnormalities. Left ventricular diastolic parameters are  consistent with Grade I diastolic  dysfunction (impaired relaxation).   2. Right ventricular systolic function is normal. The right ventricular  size is normal. Tricuspid regurgitation signal is inadequate for assessing  PA pressure.   3. The mitral valve is degenerative. No evidence of mitral valve  regurgitation. Mild mitral stenosis. The mean mitral valve gradient is 4.5  mmHg.   4. The aortic valve is calcified. Aortic valve regurgitation is not  visualized. Aortic valve sclerosis/calcification is present, without any  evidence of aortic stenosis.    5. The inferior vena cava is normal in size with greater than 50%  respiratory variability, suggesting right atrial pressure of 3 mmHg.    Cath 2021  1. Patent stent proximal to mid RCA with minimal restenosis 2. Moderate mid LAD stenosis, unchanged from last cath. This does not appear to be flow limiting.  3. Mild luminal irregularities in the moderate caliber, non-dominant Circumflex artery.  4. Normal LV systolic function  Patient Profile     82 y.o. female with a hx of CAD s/p PCI RCA, COPD, asthma, GERD, hypothyroidism, breast CA who was seen 03/11/2022 for the evaluation of elevated troponin at the request of Dr. Marthenia Rolling.    Assessment & Plan    NSTEMI CAD s/p PCI RCA '08 -- High-sensitivity troponin 469>> 1063.  In the setting of acute COPD exacerbation as well as RSV with possible pneumonia.  She does report some brief episodes of left arm pain/numbness in the days leading up to admission.  -- Echocardiogram 12/31 showed hyperdynamic LV function of greater than 75%, no regional wall motion abnormality, grade 1 diastolic dysfunction, normal RV size and function -- Remains on IV heparin, aspirin to 81 mg daily, she has been intolerant of higher statin doses, on Crestor 5 mg biweekly -- discussed cardiac cath with her, but she is quite hesitant to undergo procedure with anyone other than Dr. Angelena Form. She asked that we follow up with her later to decide    COPD exacerbation Asthma RSV --Presented with progressive dyspnea as well as URI symptoms.  Failed outpatient therapy.  Positive for RSV on admission. -- On steroids, nebulizers and antibiotics per primary   Hyperlipidemia -- Intolerant to higher doses of statin -- Crestor 5 mg biweekly -- plan for lipid clinic referral at discharge   Hypertension -- blood pressures are elevated, with her underlying lung disease would avoid BB therapy -- will add amlodipine '10mg'$  daily, if additional therapy needed would favor ARB  Per  Primary Hypothyroidism Giant Cell Arteritis   For questions or updates, please contact [place] Please consult [email or web address] for contact info under        Signed, Reino Bellis, NP  03/13/2022, 8:44 AM      Patient seen and examined. Agree with assessment and plan.  Patient denies any recent chest pain.  However she states intermittently she has noticed at times some vague left arm discomfort that had been similar to the time she had undergone cardiac catheterization with Dr. Angelena Form.  At present, unless it is an emergency she does not want to have a heart catheterization unless Dr. Angelena Form can do it.  Telemetry shows sinus tachycardia at approximately 100 to 104 bpm.  Her ECG from several days ago did not show any significant ST-T changes.  She denies any exertional chest tightness.  I reviewed her troponins with her which are elevated and concerning with initial troponin 469 increasing to 1063.  I will initiate very low-dose beta-blocker cardioselective therapy and rather than amlodipine 10 mg as noted above initiate this at 5 mg daily.  She continues to be on IV heparin.  I will repeat ECG.   Troy Sine, MD, [facility] 03/13/2022 11:01 AM

## 2022-03-13 NOTE — Progress Notes (Signed)
ANTICOAGULATION CONSULT NOTE - Follow Up Consult  Pharmacy Consult for heparin Indication: chest pain/ACS  Allergies  Allergen Reactions   Adenosine Anaphylaxis   Contrast Media [Iodinated Contrast Media] Anaphylaxis   Influenza Virus Vaccine Anaphylaxis   Nitrofurantoin Itching   Milk ([place])     Other reaction(s): Other Other Reaction: Other reaction   Breztri Aerosphere [Budeson-Glycopyrrol-Formoterol] Other (See Comments)    Urinary retention.   Codeine Sulfate Nausea And Vomiting   Latex Other (See Comments)    Skin irritation    Patient Measurements: Height: '5\' 4"'$  (162.6 cm) Weight: 69.6 kg (153 lb 6.4 oz) IBW/kg (Calculated) : 54.7 Heparin Dosing Weight: 68.7 kg  Vital Signs: Temp: 98 F (36.7 C) (01/02 0807) Temp Source: Oral (01/02 0807) BP: 155/107 (01/02 0807) Pulse Rate: 102 (01/02 0807)  Labs: Recent Labs    03/10/22 2318 03/11/22 0345 03/11/22 1135 03/11/22 1400 03/11/22 1811 03/11/22 2203 03/12/22 0540 03/13/22 0259  HGB 14.6  --   --   --   --   --  14.3 14.0  HCT 44.3  --   --   --   --   --  44.3 41.2  PLT 156  --   --   --   --   --  160 153  HEPARINUNFRC  --   --   --    < >  --  0.49 0.37 0.61  CREATININE 1.06*  --   --   --   --   --  1.25* 1.10*  TROPONINIHS 469* 1,063* 264*  --  403*  --   --   --    < > = values in this interval not displayed.    Estimated Creatinine Clearance: 37.1 mL/min (A) (by C-G formula based on SCr of 1.1 mg/dL (H)).   Medications:  Scheduled:   amLODipine  10 mg Oral Daily   aspirin EC  81 mg Oral Daily   ezetimibe  10 mg Oral Daily   famotidine  20 mg Oral QPC supper   levothyroxine  125 mcg Oral QAC breakfast   methylPREDNISolone (SOLU-MEDROL) injection  20 mg Intravenous Q12H   mometasone-formoterol  2 puff Inhalation BID   montelukast  10 mg Oral QHS   pantoprazole  40 mg Oral QAC breakfast   rosuvastatin  5 mg Oral Daily    Assessment: 82 yo F admitted for SOB and elevated troponin. Cardiology  consulted for possible NSTEMI, plan for LHC in near future.  HL today therapeutic at 0.61. Hemoglobin and platelets WNL. No s/sx of bleeding reported on physical exam.   Goal of Therapy:  Heparin level 0.3-0.7 units/ml Monitor platelets by anticoagulation protocol: Yes   Plan:  Continue heparin infusion at 850 units/hr  Check heparin level daily Continue to monitor H&H, platelets, and s/sx of bleeding daily  Freddie Breech 03/13/2022,9:16 AM

## 2022-03-13 NOTE — Progress Notes (Signed)
PROGRESS NOTE  Sonia [name] WLN:[id number] DOB: November 15, 1940   PCP: Jonathon Jordan, MD  Patient is from: Home.  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  Troponin 470>> 1063.  EKG without acute ischemic finding.  She was started on IV heparin, Solu-Medrol, antibiotics and nebulizers.  Cardiology consulted.  Bacterial pneumonia felt to be less likely.  Antibiotics discontinued.  Continued on IV heparin and COPD treatments.  Cardiology offered heart catheterization but she is hesitant unless it is done by her own cardiologist.   Subjective: Seen and examined earlier this morning.  No major events overnight of this morning.  She denies chest pain.  Reports improvement in her cough and shortness of breath.  Cardiology offered [facility] but she is hesitant about this unless it is done by her own cardiologist.  Patient's friend at bedside.  Objective: Vitals:   03/13/22 0923 03/13/22 1138 03/13/22 1224 03/13/22 1323  BP: (!) 115/103 (!) 144/98 (!) 134/100 (!) 102/34  Pulse: (!) 132 (!) 106    Resp: 20 18    Temp:  98 F (36.7 C)    TempSrc:  Oral    SpO2: 93% 93%    Weight:      Height:        Examination:  GENERAL: No apparent distress.  Nontoxic. HEENT: MMM.  Vision and hearing grossly intact.  NECK: Supple.  No apparent JVD.  RESP:  No IWOB.  Fair aeration bilaterally.  Rhonchi and intermittent cough with deep breathing. CVS:  RRR. Heart sounds normal.  ABD/GI/GU: BS+. Abd soft, NTND.  MSK/EXT:  Moves extremities. No apparent deformity. No edema.  SKIN: no apparent skin lesion or wound NEURO:  Awake, alert and oriented appropriately.  No apparent focal neuro deficit. PSYCH: Calm. Normal affect.   Procedures:  None  Microbiology summarized: 12/30-RSV PCR reactive. 12/30-COVID-19 and influenza PCR nonreactive.  Assessment and plan: Principal Problem:   COPD with acute exacerbation ([place]) Active Problems:   Dyslipidemia   COPD with asthma ([place])   Hypothyroidism   History of melanoma   CAD S/P percutaneous coronary angioplasty   Carcinoma of upper-outer quadrant of right breast in female, estrogen receptor positive ([place])   Non-STEMI (non-ST elevated myocardial infarction) ([place])   RSV (respiratory syncytial virus pneumonia)   Acute bronchiolitis due to respiratory syncytial virus (RSV)  COPD with acute exacerbation: PFT in 2013 suggested COPD without significant response to bronchodilator.  Normal DLCO.  Exacerbation likely due to RSV infection.  Symptoms improving. -Continue Solu-Medrol and Singulair -Start Tiburcio Bash -Change as needed albuterol to as needed DuoNebs -Encourage incentive spirometry   Non-STEMI in patient with history of CAD/RCA stent: Troponin peaked at 1000 and trended down.  Patient without chest pain.  EKG without acute ischemic finding. -Cardiology managing-on IV heparin. -Patient is undecided about heart catheterization -Start low-dose metoprolol -Continue aspirin, Crestor and Zetia  Essential hypertension/sinus tachycardia: BP and HR slightly elevated this morning.  Started low-dose metoprolol.  BP seems to be low now -Discontinue amlodipine -Metoprolol decreased to 12.5 mg twice daily   Hypothyroidism/h/o Hashimoto's -Continue home Synthroid   PMR/giant cell arteritis -Maintained on chronic p.o. steroids  Hyperlipidemia -Continue home Crestor  Goal of care counseling: There is mention of DNR in patient's chart at some point.  However, patient stated that she is full code.    Fibromyalgia H/o malignant melanoma H/o sclerosing  adenitis breast  Body mass index is 26.33 kg/m.          DVT prophylaxis:  SCDs Start: 03/11/22 0729 On IV heparin infusion.  Code Status: Full code Family Communication: Updated patient's friend at bedside. Level of care: Telemetry Cardiac Status is: Inpatient Remains inpatient appropriate because: COPD exacerbation and non-STEMI   Final disposition: Likely home once cleared by cardiology Consultants:  Cardiology  Sch Meds:  Scheduled Meds:  aspirin EC  81 mg Oral Daily   ezetimibe  10 mg Oral Daily   famotidine  20 mg Oral QPC supper   levothyroxine  125 mcg Oral QAC breakfast   methylPREDNISolone (SOLU-MEDROL) injection  20 mg Intravenous Q12H   metoprolol tartrate  12.5 mg Oral BID   mometasone-formoterol  2 puff Inhalation BID   montelukast  10 mg Oral QHS   pantoprazole  40 mg Oral QAC breakfast   revefenacin  175 mcg Nebulization Daily   rosuvastatin  5 mg Oral Daily   Continuous Infusions:  heparin 850 Units/hr (03/13/22 1000)   PRN Meds:.acetaminophen, benzonatate, ipratropium-albuterol, metoprolol tartrate  Antimicrobials: Anti-infectives (From admission, onward)    Start     Dose/Rate Route Frequency Ordered Stop   03/11/22 0800  cefTRIAXone (ROCEPHIN) 2 g in sodium chloride 0.9 % 100 mL IVPB  Status:  Discontinued        2 g 200 mL/hr over 30 Minutes Intravenous Every 24 hours 03/11/22 0641 03/12/22 1207   03/11/22 0700  azithromycin (ZITHROMAX) 500 mg in sodium chloride 0.9 % 250 mL IVPB  Status:  Discontinued        500 mg 250 mL/hr over 60 Minutes Intravenous Daily 03/11/22 0641 03/12/22 1207        I have personally reviewed the following labs and images: CBC: Recent Labs  Lab 03/10/22 2318 03/12/22 0540 03/13/22 0259  WBC 9.0 12.8* 9.2  HGB 14.6 14.3 14.0  HCT 44.3 44.3 41.2  MCV 94.3 94.5 91.4  PLT 156 160 153   BMP &GFR Recent Labs  Lab 03/10/22 2318 03/12/22 0540 03/13/22 0259  NA 142 139 141  K 3.7 4.6 4.1  CL 107 109  106  CO2 '26 22 25  '$ GLUCOSE 131* 157* 145*  BUN '20 22 21  '$ CREATININE 1.06* 1.25* 1.10*  CALCIUM 9.1 8.9 8.8*  MG  --  2.3  --   PHOS  --  4.1  --    Estimated Creatinine Clearance: 37.1 mL/min (A) (by C-G formula based on SCr of 1.1 mg/dL (H)). Liver & Pancreas: Recent Labs  Lab 03/10/22 2318 03/12/22 0540  AST 25  --   ALT 14  --   ALKPHOS 52  --   BILITOT 1.0  --   PROT 6.4*  --   ALBUMIN 3.6 3.6   No results for input(s): "LIPASE", "AMYLASE" in the last 168 hours. No results for input(s): "AMMONIA" in the last 168 hours. Diabetic: No results for input(s): "HGBA1C" in the last 72 hours. No results for input(s): "GLUCAP" in the last 168 hours. Cardiac Enzymes: No results for input(s): "CKTOTAL", "CKMB", "CKMBINDEX", "TROPONINI" in the last 168 hours. No results for input(s): "PROBNP" in the last 8760 hours. Coagulation Profile: No results for input(s): "INR", "PROTIME" in the last 168 hours.  Thyroid Function Tests: Recent Labs    03/10/22 2318  TSH 2.495   Lipid Profile: Recent Labs    03/11/22 1135  CHOL 236*  HDL 90  LDLCALC 137*  TRIG 46  CHOLHDL 2.6   Anemia Panel: No results for input(s): "VITAMINB12", "FOLATE", "FERRITIN", "TIBC", "IRON", "RETICCTPCT" in the last 72 hours. Urine analysis:    Component Value Date/Time   COLORURINE YELLOW 08/14/2021 [place] 08/14/2021 1604   LABSPEC 1.020 08/14/2021 1604   PHURINE 5.0 08/14/2021 1604   GLUCOSEU NEGATIVE 08/14/2021 1604   GLUCOSEU NEGATIVE 11/28/2010 1422   HGBUR NEGATIVE 08/14/2021 1604   BILIRUBINUR NEGATIVE 08/14/2021 [place] 08/14/2021 1604   PROTEINUR NEGATIVE 08/14/2021 1604   UROBILINOGEN 0.2 11/28/2010 1422   NITRITE NEGATIVE 08/14/2021 1604   LEUKOCYTESUR NEGATIVE 08/14/2021 1604   Sepsis Labs: Invalid input(s): "PROCALCITONIN", "LACTICIDVEN"  Microbiology: Recent Results (from the past 240 hour(s))  Resp panel by RT-PCR (RSV, Flu A&B, Covid)  Anterior Nasal Swab     Status: Abnormal   Collection Time: 03/10/22 11:13 PM   Specimen: Anterior Nasal Swab  Result Value Ref Range Status   SARS Coronavirus 2 by RT PCR NEGATIVE NEGATIVE Final    Comment: (NOTE) SARS-CoV-2 target nucleic acids are NOT DETECTED.  The SARS-CoV-2 RNA is generally detectable in upper respiratory specimens during the acute phase of infection. The lowest concentration of SARS-CoV-2 viral copies this assay can detect is 138 copies/mL. A negative result does not preclude SARS-Cov-2 infection and should not be used as the sole basis for treatment or other patient management decisions. A negative result may occur with  improper specimen collection/handling, submission of specimen other than nasopharyngeal swab, presence of viral mutation(s) within the areas targeted by this assay, and inadequate number of viral copies(<138 copies/mL). A negative result must be combined with clinical observations, patient history, and epidemiological information. The expected result is Negative.  Fact Sheet for Patients:  EntrepreneurPulse.com.au  Fact Sheet for Healthcare Providers:  IncredibleEmployment.be  This test is no t yet approved or cleared by the [place] FDA and  has been authorized for detection and/or diagnosis of SARS-CoV-2 by FDA under an Emergency Use Authorization (EUA). This EUA will remain  in effect (meaning this test can be used) for the duration of the COVID-19 declaration under Section 564(b)(1) of the Act, 21 U.S.C.section 360bbb-3(b)(1), unless the authorization is terminated  or revoked sooner.       Influenza A by PCR NEGATIVE NEGATIVE Final   Influenza B by PCR NEGATIVE NEGATIVE Final    Comment: (NOTE) The Xpert Xpress SARS-CoV-2/FLU/RSV plus assay is intended as an aid in the diagnosis of influenza from Nasopharyngeal swab specimens and should not be used as a sole basis for treatment. Nasal  washings and aspirates are unacceptable for Xpert Xpress SARS-CoV-2/FLU/RSV testing.  Fact Sheet for Patients: EntrepreneurPulse.com.au  Fact Sheet for Healthcare Providers: IncredibleEmployment.be  This test is not yet approved or cleared by the [place] FDA and has been authorized for detection and/or diagnosis of SARS-CoV-2 by FDA under an Emergency Use Authorization (EUA). This EUA will remain in effect (meaning this test can be used) for the duration of the COVID-19 declaration under Section 564(b)(1) of the Act, 21 U.S.C. section 360bbb-3(b)(1), unless the authorization is terminated or revoked.     Resp Syncytial Virus by PCR POSITIVE (A) NEGATIVE Final    Comment: (NOTE) Fact Sheet for Patients: EntrepreneurPulse.com.au  Fact Sheet for Healthcare Providers: IncredibleEmployment.be  This test is not yet approved or cleared by the [place] and has been authorized for detection and/or diagnosis of SARS-CoV-2 by FDA under an Emergency Use Authorization (EUA). This EUA will remain in effect (meaning this test can be used) for the duration of the COVID-19 declaration under Section 564(b)(1) of the Act, 21 U.S.C. section 360bbb-3(b)(1), unless the authorization is terminated or revoked.  Performed at [facility], [place] [address] [place],  [address]     Radiology Studies: No results found.    Avalee Castrellon T. [place]  If 7PM-7AM, please contact night-coverage [email or web address] 03/13/2022, 2:17 PM

## 2022-03-13 NOTE — Care Management (Signed)
  Transition of Care [facility]) Screening Note   Patient Details  Name: Sonia [name] Date of Birth: 10/07/1940   Transition of Care [facility]) CM/SW Contact:    Bethena Roys, RN Phone Number: 03/13/2022, 1:23 PM    Transition of Care Department [facility]) has reviewed the patient and no TOC needs have been identified at this time. We will continue to monitor patient advancement through interdisciplinary progression rounds. If new patient transition needs arise, please place a TOC consult.

## 2022-03-14 ENCOUNTER — Other Ambulatory Visit: Payer: Self-pay

## 2022-03-14 DIAGNOSIS — J441 Chronic obstructive pulmonary disease with (acute) exacerbation: Secondary | ICD-10-CM | POA: Diagnosis not present

## 2022-03-14 DIAGNOSIS — J210 Acute bronchiolitis due to respiratory syncytial virus: Principal | ICD-10-CM

## 2022-03-14 DIAGNOSIS — R7989 Other specified abnormal findings of blood chemistry: Secondary | ICD-10-CM | POA: Diagnosis not present

## 2022-03-14 DIAGNOSIS — J4489 Other specified chronic obstructive pulmonary disease: Secondary | ICD-10-CM | POA: Diagnosis not present

## 2022-03-14 DIAGNOSIS — I214 Non-ST elevation (NSTEMI) myocardial infarction: Secondary | ICD-10-CM | POA: Diagnosis not present

## 2022-03-14 DIAGNOSIS — I2510 Atherosclerotic heart disease of native coronary artery without angina pectoris: Secondary | ICD-10-CM | POA: Diagnosis not present

## 2022-03-14 LAB — RENAL FUNCTION PANEL
Albumin: 3.2 g/dL — ABNORMAL LOW (ref 3.5–5.0)
Anion gap: 8 (ref 5–15)
BUN: 24 mg/dL — ABNORMAL HIGH (ref 8–23)
CO2: 24 mmol/L (ref 22–32)
Calcium: 8.8 mg/dL — ABNORMAL LOW (ref 8.9–10.3)
Chloride: 103 mmol/L (ref 98–111)
Creatinine, Ser: 1.02 mg/dL — ABNORMAL HIGH (ref 0.44–1.00)
GFR, Estimated: 55 mL/min — ABNORMAL LOW (ref >60)
Glucose, Bld: 137 mg/dL — ABNORMAL HIGH (ref 70–99)
Phosphorus: 3.2 mg/dL (ref 2.5–4.6)
Potassium: 4 mmol/L (ref 3.5–5.1)
Sodium: 135 mmol/L (ref 135–145)

## 2022-03-14 LAB — CBC
HCT: 44.8 % (ref 36.0–46.0)
Hemoglobin: 14.8 g/dL (ref 12.0–15.0)
MCH: 30.6 pg (ref 26.0–34.0)
MCHC: 33 g/dL (ref 30.0–36.0)
MCV: 92.6 fL (ref 80.0–100.0)
Platelets: 151 10*3/uL (ref 150–400)
RBC: 4.84 MIL/uL (ref 3.87–5.11)
RDW: 13.6 % (ref 11.5–15.5)
WBC: 8.8 10*3/uL (ref 4.0–10.5)
nRBC: 0 % (ref 0.0–0.2)

## 2022-03-14 LAB — HEPARIN LEVEL (UNFRACTIONATED): Heparin Unfractionated: 0.61 IU/mL (ref 0.30–0.70)

## 2022-03-14 LAB — MAGNESIUM: Magnesium: 2.2 mg/dL (ref 1.7–2.4)

## 2022-03-14 MED ORDER — GUAIFENESIN ER 600 MG PO TB12
600.0000 mg | ORAL_TABLET | Freq: Two times a day (BID) | ORAL | Status: DC
  Administered 2022-03-14 – 2022-03-16 (×5): 600 mg via ORAL
  Filled 2022-03-14 (×5): qty 1

## 2022-03-14 MED ORDER — ENOXAPARIN SODIUM 40 MG/0.4ML IJ SOSY
40.0000 mg | PREFILLED_SYRINGE | INTRAMUSCULAR | Status: DC
  Administered 2022-03-14 – 2022-03-16 (×3): 40 mg via SUBCUTANEOUS
  Filled 2022-03-14 (×3): qty 0.4

## 2022-03-14 MED ORDER — GUAIFENESIN-DM 100-10 MG/5ML PO SYRP: 5.0000 mL | ORAL_SOLUTION | ORAL | Status: DC | PRN

## 2022-03-14 MED ORDER — METHYLPREDNISOLONE SODIUM SUCC 40 MG IJ SOLR
40.0000 mg | Freq: Two times a day (BID) | INTRAMUSCULAR | Status: DC
  Administered 2022-03-14 – 2022-03-16 (×4): 40 mg via INTRAVENOUS
  Filled 2022-03-14 (×4): qty 1

## 2022-03-14 MED ORDER — BUDESONIDE 0.5 MG/2ML IN SUSP
0.5000 mg | Freq: Two times a day (BID) | RESPIRATORY_TRACT | Status: DC
  Administered 2022-03-14 – 2022-03-16 (×5): 0.5 mg via RESPIRATORY_TRACT
  Filled 2022-03-14 (×5): qty 2

## 2022-03-14 MED ORDER — ARFORMOTEROL TARTRATE 15 MCG/2ML IN NEBU
15.0000 ug | INHALATION_SOLUTION | Freq: Two times a day (BID) | RESPIRATORY_TRACT | Status: DC
  Administered 2022-03-14 – 2022-03-16 (×5): 15 ug via RESPIRATORY_TRACT
  Filled 2022-03-14 (×5): qty 2

## 2022-03-14 NOTE — Progress Notes (Signed)
PROGRESS NOTE  Sonia [name] EPP:[id number] DOB: 1940-05-11   PCP: Jonathon Jordan, MD  Patient is from: Home.  DOA: 03/10/2022 LOS: 3  Chief complaints Chief Complaint  Patient presents with   Shortness of Breath     Brief Narrative / Interim history: 82 year old F with PMH of CAD s/p RCA stent, COPD, asthma, PMR on chronic steroid, hypothyroidism, fibromyalgia and malignant melanoma presenting with shortness of breath, wheezing and URI symptoms and admitted for COPD exacerbation in the setting of RSV infection and NSTEMI.  Troponin 470>> 1063.  EKG without acute ischemic finding.  She was started on IV heparin, Solu-Medrol, antibiotics and nebulizers.  Cardiology consulted.  Bacterial pneumonia felt to be less likely.  Antibiotics discontinued.  Continued on IV heparin and COPD treatments.  Cardiology offered heart catheterization but she declined unless it is done by her own cardiologist.  Heparin discontinued.  She is currently managed for COPD exacerbation   Subjective: Seen and examined earlier this morning.  No major events overnight of this morning.  Continues to endorse shortness of breath and cough.  Reports coughing with minimal exertion.  Denies chest pain  Objective: Vitals:   03/14/22 0934 03/14/22 0935 03/14/22 0950 03/14/22 1229  BP: (!) 149/57 (!) 149/57  112/78  Pulse: (!) 117   88  Resp:    18  Temp:    97.7 F (36.5 C)  TempSrc:    Oral  SpO2:   100% 97%  Weight:      Height:        Examination:  GENERAL: No apparent distress.  Nontoxic. HEENT: MMM.  Vision and hearing grossly intact.  NECK: Supple.  No apparent JVD.  RESP:  No IWOB.  Fair aeration bilaterally.  Rhonchi bilaterally.  Frequent cough. CVS:  RRR. Heart sounds normal.  ABD/GI/GU: BS+. Abd soft, NTND.  MSK/EXT:  Moves extremities. No apparent deformity. No edema.  SKIN: no apparent skin lesion or wound NEURO: Awake and alert. Oriented appropriately.  No apparent focal neuro  deficit. PSYCH: Calm. Normal affect.   Procedures:  None  Microbiology summarized: 12/30-RSV PCR reactive. 12/30-COVID-19 and influenza PCR nonreactive.  Assessment and plan: Principal Problem:   COPD with acute exacerbation ([place]) Active Problems:   Dyslipidemia   COPD with asthma ([place])   Hypothyroidism   History of melanoma   CAD S/P percutaneous coronary angioplasty   Carcinoma of upper-outer quadrant of right breast in female, estrogen receptor positive ([place])   Non-STEMI (non-ST elevated myocardial infarction) ([place])   RSV (respiratory syncytial virus pneumonia)   Acute bronchiolitis due to respiratory syncytial virus (RSV)  COPD with acute exacerbation: PFT in 2013 suggested COPD without significant response to bronchodilator.  Normal DLCO.  Exacerbation likely due to RSV infection.  Still with significant cough, shortness of breath and rhonchi. -Increase IV Solu-Medrol to 40 mg twice daily -Discontinue Dulera.  Start Brovana, budesonide -Continue Yupelri and Singulair. -Continue DuoNebs as needed -Continue PPI -Start mucolytic's and antitussive -Encourage incentive spirometry   Non-STEMI in patient with history of CAD/RCA stent: Troponin peaked at 1000 and trended down.  Patient without chest pain.  EKG without acute ischemic finding.  Patient declined [place] -Cardiology managing-heparin drip discontinued by cardiology. -Continue low-dose metoprolol, aspirin, Crestor and Zetia  Essential hypertension/sinus tachycardia: Normotensive this morning. -Continue holding amlodipine -Metoprolol decreased to 12.5 mg twice daily   Hypothyroidism/h/o Hashimoto's -Continue home Synthroid   PMR/giant cell arteritis -Maintained on chronic p.o. steroids  Hyperlipidemia -Continue home Crestor  Goal of care  counseling: There is mention of DNR in patient's chart at some point.  However, patient stated that she is full code.    Fibromyalgia H/o malignant melanoma H/o sclerosing  adenitis breast  Body mass index is 26.33 kg/m.          DVT prophylaxis:  enoxaparin (LOVENOX) injection 40 mg Start: 03/14/22 0930 SCDs Start: 03/11/22 0729 On IV heparin infusion.  Code Status: Full code Family Communication: None at bedside. Level of care: Telemetry Cardiac Status is: Inpatient Remains inpatient appropriate because: COPD exacerbation and non-STEMI   Final disposition: Likely home once improved from respiratory standpoint Consultants:  Cardiology  Sch Meds:  Scheduled Meds:  arformoterol  15 mcg Nebulization BID   aspirin EC  81 mg Oral Daily   budesonide (PULMICORT) nebulizer solution  0.5 mg Nebulization BID   enoxaparin (LOVENOX) injection  40 mg Subcutaneous Q24H   ezetimibe  10 mg Oral Daily   famotidine  20 mg Oral QPC supper   guaiFENesin  600 mg Oral BID   levothyroxine  125 mcg Oral QAC breakfast   methylPREDNISolone (SOLU-MEDROL) injection  40 mg Intravenous Q12H   metoprolol tartrate  12.5 mg Oral BID   montelukast  10 mg Oral QHS   pantoprazole  40 mg Oral QAC breakfast   revefenacin  175 mcg Nebulization Daily   rosuvastatin  5 mg Oral Daily   Continuous Infusions:   PRN Meds:.acetaminophen, benzonatate, guaiFENesin-dextromethorphan, ipratropium-albuterol, metoprolol tartrate  Antimicrobials: Anti-infectives (From admission, onward)    Start     Dose/Rate Route Frequency Ordered Stop   03/11/22 0800  cefTRIAXone (ROCEPHIN) 2 g in sodium chloride 0.9 % 100 mL IVPB  Status:  Discontinued        2 g 200 mL/hr over 30 Minutes Intravenous Every 24 hours 03/11/22 0641 03/12/22 1207   03/11/22 0700  azithromycin (ZITHROMAX) 500 mg in sodium chloride 0.9 % 250 mL IVPB  Status:  Discontinued        500 mg 250 mL/hr over 60 Minutes Intravenous Daily 03/11/22 0641 03/12/22 1207        I have personally reviewed the following labs and images: CBC: Recent Labs  Lab 03/10/22 2318 03/12/22 0540 03/13/22 0259 03/14/22 0158  WBC  9.0 12.8* 9.2 8.8  HGB 14.6 14.3 14.0 14.8  HCT 44.3 44.3 41.2 44.8  MCV 94.3 94.5 91.4 92.6  PLT 156 160 153 151   BMP &GFR Recent Labs  Lab 03/10/22 2318 03/12/22 0540 03/13/22 0259 03/14/22 0158  NA 142 139 141 135  K 3.7 4.6 4.1 4.0  CL 107 109 106 103  CO2 '26 22 25 24  '$ GLUCOSE 131* 157* 145* 137*  BUN '20 22 21 '$ 24*  CREATININE 1.06* 1.25* 1.10* 1.02*  CALCIUM 9.1 8.9 8.8* 8.8*  MG  --  2.3  --  2.2  PHOS  --  4.1  --  3.2   Estimated Creatinine Clearance: 40 mL/min (A) (by C-G formula based on SCr of 1.02 mg/dL (H)). Liver & Pancreas: Recent Labs  Lab 03/10/22 2318 03/12/22 0540 03/14/22 0158  AST 25  --   --   ALT 14  --   --   ALKPHOS 52  --   --   BILITOT 1.0  --   --   PROT 6.4*  --   --   ALBUMIN 3.6 3.6 3.2*   No results for input(s): "LIPASE", "AMYLASE" in the last 168 hours. No results for input(s): "AMMONIA" in the last 168  hours. Diabetic: No results for input(s): "HGBA1C" in the last 72 hours. No results for input(s): "GLUCAP" in the last 168 hours. Cardiac Enzymes: No results for input(s): "CKTOTAL", "CKMB", "CKMBINDEX", "TROPONINI" in the last 168 hours. No results for input(s): "PROBNP" in the last 8760 hours. Coagulation Profile: No results for input(s): "INR", "PROTIME" in the last 168 hours. Thyroid Function Tests: No results for input(s): "TSH", "T4TOTAL", "FREET4", "T3FREE", "THYROIDAB" in the last 72 hours.  Lipid Profile: No results for input(s): "CHOL", "HDL", "LDLCALC", "TRIG", "CHOLHDL", "LDLDIRECT" in the last 72 hours.  Anemia Panel: No results for input(s): "VITAMINB12", "FOLATE", "FERRITIN", "TIBC", "IRON", "RETICCTPCT" in the last 72 hours. Urine analysis:    Component Value Date/Time   COLORURINE YELLOW 08/14/2021 [place] 08/14/2021 1604   LABSPEC 1.020 08/14/2021 1604   PHURINE 5.0 08/14/2021 1604   GLUCOSEU NEGATIVE 08/14/2021 1604   GLUCOSEU NEGATIVE 11/28/2010 1422   HGBUR NEGATIVE 08/14/2021 1604    BILIRUBINUR NEGATIVE 08/14/2021 [place] 08/14/2021 1604   PROTEINUR NEGATIVE 08/14/2021 1604   UROBILINOGEN 0.2 11/28/2010 1422   NITRITE NEGATIVE 08/14/2021 1604   LEUKOCYTESUR NEGATIVE 08/14/2021 1604   Sepsis Labs: Invalid input(s): "PROCALCITONIN", "LACTICIDVEN"  Microbiology: Recent Results (from the past 240 hour(s))  Resp panel by RT-PCR (RSV, Flu A&B, Covid) Anterior Nasal Swab     Status: Abnormal   Collection Time: 03/10/22 11:13 PM   Specimen: Anterior Nasal Swab  Result Value Ref Range Status   SARS Coronavirus 2 by RT PCR NEGATIVE NEGATIVE Final    Comment: (NOTE) SARS-CoV-2 target nucleic acids are NOT DETECTED.  The SARS-CoV-2 RNA is generally detectable in upper respiratory specimens during the acute phase of infection. The lowest concentration of SARS-CoV-2 viral copies this assay can detect is 138 copies/mL. A negative result does not preclude SARS-Cov-2 infection and should not be used as the sole basis for treatment or other patient management decisions. A negative result may occur with  improper specimen collection/handling, submission of specimen other than nasopharyngeal swab, presence of viral mutation(s) within the areas targeted by this assay, and inadequate number of viral copies(<138 copies/mL). A negative result must be combined with clinical observations, patient history, and epidemiological information. The expected result is Negative.  Fact Sheet for Patients:  EntrepreneurPulse.com.au  Fact Sheet for Healthcare Providers:  IncredibleEmployment.be  This test is no t yet approved or cleared by the [place] FDA and  has been authorized for detection and/or diagnosis of SARS-CoV-2 by FDA under an Emergency Use Authorization (EUA). This EUA will remain  in effect (meaning this test can be used) for the duration of the COVID-19 declaration under Section 564(b)(1) of the Act,  21 U.S.C.section 360bbb-3(b)(1), unless the authorization is terminated  or revoked sooner.       Influenza A by PCR NEGATIVE NEGATIVE Final   Influenza B by PCR NEGATIVE NEGATIVE Final    Comment: (NOTE) The Xpert Xpress SARS-CoV-2/FLU/RSV plus assay is intended as an aid in the diagnosis of influenza from Nasopharyngeal swab specimens and should not be used as a sole basis for treatment. Nasal washings and aspirates are unacceptable for Xpert Xpress SARS-CoV-2/FLU/RSV testing.  Fact Sheet for Patients: EntrepreneurPulse.com.au  Fact Sheet for Healthcare Providers: IncredibleEmployment.be  This test is not yet approved or cleared by the [place] FDA and has been authorized for detection and/or diagnosis of SARS-CoV-2 by FDA under an Emergency Use Authorization (EUA). This EUA will remain in effect (meaning this test can be used)  for the duration of the COVID-19 declaration under Section 564(b)(1) of the Act, 21 U.S.C. section 360bbb-3(b)(1), unless the authorization is terminated or revoked.     Resp Syncytial Virus by PCR POSITIVE (A) NEGATIVE Final    Comment: (NOTE) Fact Sheet for Patients: EntrepreneurPulse.com.au  Fact Sheet for Healthcare Providers: IncredibleEmployment.be  This test is not yet approved or cleared by the [place] FDA and has been authorized for detection and/or diagnosis of SARS-CoV-2 by FDA under an Emergency Use Authorization (EUA). This EUA will remain in effect (meaning this test can be used) for the duration of the COVID-19 declaration under Section 564(b)(1) of the Act, 21 U.S.C. section 360bbb-3(b)(1), unless the authorization is terminated or revoked.  Performed at [facility], [place] [address] [place], [place] [address]     Radiology Studies: No results found.    Raiford Fetterman T. [place]  If 7PM-7AM, please contact  night-coverage [email or web address] 03/14/2022, 1:56 PM

## 2022-03-14 NOTE — Plan of Care (Signed)

## 2022-03-14 NOTE — Progress Notes (Addendum)
Rounding Note    Patient Name: Sonia [name] Date of Encounter: 03/14/2022  [place] Cardiologist: [name], MD   Subjective   Breathing has improved. She wants to order breakfast.   Inpatient Medications    Scheduled Meds:  aspirin EC  81 mg Oral Daily   ezetimibe  10 mg Oral Daily   famotidine  20 mg Oral QPC supper   levothyroxine  125 mcg Oral QAC breakfast   methylPREDNISolone (SOLU-MEDROL) injection  20 mg Intravenous Q12H   metoprolol tartrate  12.5 mg Oral BID   mometasone-formoterol  2 puff Inhalation BID   montelukast  10 mg Oral QHS   pantoprazole  40 mg Oral QAC breakfast   revefenacin  175 mcg Nebulization Daily   rosuvastatin  5 mg Oral Daily   Continuous Infusions:  heparin 850 Units/hr (03/14/22 0457)   PRN Meds: acetaminophen, benzonatate, ipratropium-albuterol, metoprolol tartrate   Vital Signs    Vitals:   03/13/22 2049 03/14/22 0030 03/14/22 0448 03/14/22 0744  BP: (!) 177/77 122/79 (!) 143/90 139/84  Pulse: (!) 107 92 94 83  Resp: '18 18 18 20  '$ Temp: 98.2 F (36.8 C) 98.3 F (36.8 C) 97.9 F (36.6 C) 98.2 F (36.8 C)  TempSrc: Oral Oral Oral Oral  SpO2: 93% 92% 94% 91%  Weight:      Height:        Intake/Output Summary (Last 24 hours) at 03/14/2022 0750 Last data filed at 03/14/2022 0457 Gross per 24 hour  Intake 581.45 ml  Output --  Net 581.45 ml      03/11/2022    5:28 PM 01/09/2022   11:41 AM 10/03/2021   10:37 AM  Last 3 Weights  Weight (lbs) 153 lb 6.4 oz 153 lb 9.6 oz 148 lb  Weight (kg) 69.582 kg 69.673 kg 67.132 kg      Telemetry    Sinus rhythm, tachy at times, PACs, PVCs  - Personally Reviewed  ECG    Sinus Tachycardia, 102 bpm, PVCs - Personally Reviewed  Physical Exam   GEN: No acute distress.   Neck: No JVD Cardiac: RRR, no murmurs, rubs, or gallops.  Respiratory: Expiratory wheeze GI: Soft, nontender, non-distended  MS: No edema; No deformity. Neuro:  Nonfocal   Psych: Normal affect   Labs    High Sensitivity Troponin:   Recent Labs  Lab 03/10/22 2318 03/11/22 0345 03/11/22 1135 03/11/22 1811  TROPONINIHS 469* 1,063* 264* 403*     Chemistry Recent Labs  Lab 03/10/22 2318 03/12/22 0540 03/13/22 0259 03/14/22 0158  NA 142 139 141 135  K 3.7 4.6 4.1 4.0  CL 107 109 106 103  CO2 '26 22 25 24  '$ GLUCOSE 131* 157* 145* 137*  BUN '20 22 21 '$ 24*  CREATININE 1.06* 1.25* 1.10* 1.02*  CALCIUM 9.1 8.9 8.8* 8.8*  MG  --  2.3  --  2.2  PROT 6.4*  --   --   --   ALBUMIN 3.6 3.6  --  3.2*  AST 25  --   --   --   ALT 14  --   --   --   ALKPHOS 52  --   --   --   BILITOT 1.0  --   --   --   GFRNONAA 52* 43* 50* 55*  ANIONGAP '9 8 10 8    '$ Lipids  Recent Labs  Lab 03/11/22 1135  CHOL 236*  TRIG 46  HDL 90  LDLCALC  137*  CHOLHDL 2.6    Hematology Recent Labs  Lab 03/12/22 0540 03/13/22 0259 03/14/22 0158  WBC 12.8* 9.2 8.8  RBC 4.69 4.51 4.84  HGB 14.3 14.0 14.8  HCT 44.3 41.2 44.8  MCV 94.5 91.4 92.6  MCH 30.5 31.0 30.6  MCHC 32.3 34.0 33.0  RDW 13.9 13.9 13.6  PLT 160 153 151   Thyroid  Recent Labs  Lab 03/10/22 2318  TSH 2.495    BNP Recent Labs  Lab 03/10/22 2318  BNP 46.8    DDimer No results for input(s): "DDIMER" in the last 168 hours.   Radiology    No results found.  Cardiac Studies   TTE 03/11/22    1. Left ventricular ejection fraction, by estimation, is >75%. The left  ventricle has hyperdynamic function. The left ventricle has no regional  wall motion abnormalities. Left ventricular diastolic parameters are  consistent with Grade I diastolic  dysfunction (impaired relaxation).   2. Right ventricular systolic function is normal. The right ventricular  size is normal. Tricuspid regurgitation signal is inadequate for assessing  PA pressure.   3. The mitral valve is degenerative. No evidence of mitral valve  regurgitation. Mild mitral stenosis. The mean mitral valve gradient is 4.5  mmHg.   4.  The aortic valve is calcified. Aortic valve regurgitation is not  visualized. Aortic valve sclerosis/calcification is present, without any  evidence of aortic stenosis.   5. The inferior vena cava is normal in size with greater than 50%  respiratory variability, suggesting right atrial pressure of 3 mmHg.   Patient Profile     82 y.o. female with a hx of CAD s/p PCI RCA, COPD, asthma, GERD, hypothyroidism, breast CA who was seen 03/11/2022 for the evaluation of elevated troponin at the request of Dr. Marthenia Rolling.     Assessment & Plan    NSTEMI CAD s/p PCI RCA '08 -- High-sensitivity troponin 469>> 1063.  In the setting of acute COPD exacerbation as well as RSV with possible pneumonia.  She does report some brief episodes of left arm pain/numbness in the days leading up to admission.  -- Echocardiogram 12/31 showed hyperdynamic LV function of greater than 75%, no regional wall motion abnormality, grade 1 diastolic dysfunction, normal RV size and function -- Remains on IV heparin, aspirin to 81 mg daily, she has been intolerant of higher statin doses, on Crestor 5 mg biweekly -- again discussed cardiac cath with her, but she is quite hesitant to undergo procedure with anyone other than Dr. Angelena Form. At this time she continues to decline. Will stop IV heparin    COPD exacerbation Asthma RSV --Presented with progressive dyspnea as well as URI symptoms.  Failed outpatient therapy.  Positive for RSV on admission. -- On steroids, nebulizers and antibiotics per primary   Hyperlipidemia -- Intolerant to higher doses of statin -- Crestor 5 mg biweekly -- plan for lipid clinic referral at discharge    Hypertension -- blood pressures were elevated, with her underlying lung disease she was started on cardioselective BB per MD, metoprolol 12.'5mg'$  BID   Per Primary Hypothyroidism Giant Cell Arteritis   Recommendations to continue with medical therapy. Will arrange outpatient follow up in the office.     For questions or updates, please contact [place] Please consult [email or web address] for contact info under        Signed, Reino Bellis, NP  03/14/2022, 7:50 AM     Patient seen and examined. Agree with assessment and plan.  No recurrent arm pain or any chest pain.  She continues to have diffuse rhonchi bilaterally in lung fields.  Heparin has been discontinued as noted above.  Steroids have been increased per hospital team.  Initially plan medical therapy with radiology cardiology follow-up with Dr. Angelena Form as outpatient.  If patient develops recurrent anginal equivalent symptomatology with increased tone and with discontinuance of heparin, reassessment will be necessary.   Troy Sine, MD, [facility] 03/14/2022 10:38 AM

## 2022-03-15 DIAGNOSIS — J210 Acute bronchiolitis due to respiratory syncytial virus: Secondary | ICD-10-CM | POA: Diagnosis not present

## 2022-03-15 DIAGNOSIS — J4489 Other specified chronic obstructive pulmonary disease: Secondary | ICD-10-CM | POA: Diagnosis not present

## 2022-03-15 DIAGNOSIS — J441 Chronic obstructive pulmonary disease with (acute) exacerbation: Secondary | ICD-10-CM | POA: Diagnosis not present

## 2022-03-15 DIAGNOSIS — I214 Non-ST elevation (NSTEMI) myocardial infarction: Secondary | ICD-10-CM | POA: Diagnosis not present

## 2022-03-15 LAB — CBC
HCT: 44.1 % (ref 36.0–46.0)
Hemoglobin: 14.9 g/dL (ref 12.0–15.0)
MCH: 30.7 pg (ref 26.0–34.0)
MCHC: 33.8 g/dL (ref 30.0–36.0)
MCV: 90.7 fL (ref 80.0–100.0)
Platelets: 152 10*3/uL (ref 150–400)
RBC: 4.86 MIL/uL (ref 3.87–5.11)
RDW: 13.3 % (ref 11.5–15.5)
WBC: 8.6 10*3/uL (ref 4.0–10.5)
nRBC: 0 % (ref 0.0–0.2)

## 2022-03-15 MED ORDER — METOPROLOL TARTRATE 25 MG PO TABS: 25.0000 mg | ORAL_TABLET | Freq: Two times a day (BID) | ORAL | Status: DC

## 2022-03-15 MED ORDER — METOPROLOL SUCCINATE ER 50 MG PO TB24
50.0000 mg | ORAL_TABLET | Freq: Every day | ORAL | Status: DC
  Administered 2022-03-15 – 2022-03-16 (×2): 50 mg via ORAL
  Filled 2022-03-15 (×2): qty 1

## 2022-03-15 MED ORDER — DILTIAZEM HCL ER COATED BEADS 120 MG PO CP24: 120.0000 mg | ORAL_CAPSULE | Freq: Every day | ORAL | Status: DC

## 2022-03-15 NOTE — Progress Notes (Addendum)
PROGRESS NOTE  Sonia [name] AST:[id number] DOB: Jun 20, 1940   PCP: Jonathon Jordan, MD  Patient is from: Home.  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  Troponin 470>> 1063.  EKG without acute ischemic finding.  She was started on IV heparin, Solu-Medrol, antibiotics and nebulizers.  Cardiology consulted.  Bacterial pneumonia felt to be less likely.  Antibiotics discontinued.  Continued on IV heparin and COPD treatments.  Cardiology offered heart catheterization but she declined unless it is done by her own cardiologist.  Heparin discontinued.  She is currently managed for COPD exacerbation   Subjective: Seen and examined earlier this morning.  No major events overnight of this morning.  Continues to endorse dry cough, chest tightness and shortness of breath with minimal activity.  Objective: Vitals:   03/15/22 0802 03/15/22 0831 03/15/22 0945 03/15/22 1127  BP: 118/79  (!) 113/98 (!) 148/75  Pulse: 90  (!) 107 88  Resp: 18   18  Temp: 97.6 F (36.4 C)   98.2 F (36.8 C)  TempSrc: Oral   Oral  SpO2: 97% 100%  92%  Weight:      Height:        Examination:  GENERAL: No apparent distress.  Nontoxic. HEENT: MMM.  Vision and hearing grossly intact.  NECK: Supple.  No apparent JVD.  RESP: Some work of breathing as she speaks.  Rhonchi.  Frequently coughing.  CVS:  RRR. Heart sounds normal.  ABD/GI/GU: BS+. Abd soft, NTND.  MSK/EXT:  Moves extremities. No apparent deformity. No edema.  SKIN: no apparent skin lesion or wound NEURO: Awake and alert. Oriented appropriately.  No apparent focal  neuro deficit. PSYCH: Calm. Normal affect.   Procedures:  None  Microbiology summarized: 12/30-RSV PCR reactive. 12/30-COVID-19 and influenza PCR nonreactive.  Assessment and plan: Principal Problem:   COPD with acute exacerbation ([place]) Active Problems:   Dyslipidemia   COPD with asthma ([place])   Hypothyroidism   History of melanoma   CAD S/P percutaneous coronary angioplasty   Carcinoma of upper-outer quadrant of right breast in female, estrogen receptor positive ([place])   Non-STEMI (non-ST elevated myocardial infarction) ([place])   RSV (respiratory syncytial virus pneumonia)   Acute bronchiolitis due to respiratory syncytial virus (RSV)  COPD with acute exacerbation: PFT in 2013 suggested COPD without significant response to bronchodilator.  Normal DLCO.  Exacerbation likely due to RSV infection.  Still with significant cough, shortness of breath and rhonchi. -Continue Solu-Medrol, Brovana, budesonide, Yupelri and Singulair. -Continue DuoNebs as needed -Continue PPI -Continue mucolytic's and antitussive. -Encourage incentive spirometry   Non-STEMI in patient with history of CAD/RCA stent: Troponin peaked at 1000 and trended down.  Patient without chest pain.  EKG without acute ischemic finding.  Patient declined [place] -Cardiology signed off. -Consolidated metoprolol to Toprol-XL 50 mg daily -Continue aspirin, Crestor and Zetia  Essential hypertension/sinus tachycardia: Normotensive for most part. -Continue holding amlodipine -Metoprolol decreased to 12.5 mg twice daily   Hypothyroidism/h/o Hashimoto's -Continue home Synthroid   PMR/giant cell arteritis -Maintained on chronic p.o. steroids  Hyperlipidemia -Continue home Crestor  Goal of care counseling: Confirmed full code.   Fibromyalgia H/o malignant  melanoma H/o sclerosing adenitis breast  Body mass index is 26.33 kg/m.          DVT prophylaxis:  enoxaparin (LOVENOX) injection 40 mg Start: 03/14/22  0930 SCDs Start: 03/11/22 0729 On IV heparin infusion.  Code Status: Full code Family Communication: None at bedside. Level of care: Telemetry Cardiac Status is: Inpatient Remains inpatient appropriate because: COPD exacerbation   Final disposition: Likely home once improved from respiratory standpoint Consultants:  Cardiology-signed off  Sch Meds:  Scheduled Meds:  arformoterol  15 mcg Nebulization BID   aspirin EC  81 mg Oral Daily   budesonide (PULMICORT) nebulizer solution  0.5 mg Nebulization BID   enoxaparin (LOVENOX) injection  40 mg Subcutaneous Q24H   ezetimibe  10 mg Oral Daily   famotidine  20 mg Oral QPC supper   guaiFENesin  600 mg Oral BID   levothyroxine  125 mcg Oral QAC breakfast   methylPREDNISolone (SOLU-MEDROL) injection  40 mg Intravenous Q12H   metoprolol succinate  50 mg Oral Daily   montelukast  10 mg Oral QHS   pantoprazole  40 mg Oral QAC breakfast   revefenacin  175 mcg Nebulization Daily   rosuvastatin  5 mg Oral Daily   Continuous Infusions:   PRN Meds:.acetaminophen, benzonatate, guaiFENesin-dextromethorphan, ipratropium-albuterol, metoprolol tartrate  Antimicrobials: Anti-infectives (From admission, onward)    Start     Dose/Rate Route Frequency Ordered Stop   03/11/22 0800  cefTRIAXone (ROCEPHIN) 2 g in sodium chloride 0.9 % 100 mL IVPB  Status:  Discontinued        2 g 200 mL/hr over 30 Minutes Intravenous Every 24 hours 03/11/22 0641 03/12/22 1207   03/11/22 0700  azithromycin (ZITHROMAX) 500 mg in sodium chloride 0.9 % 250 mL IVPB  Status:  Discontinued        500 mg 250 mL/hr over 60 Minutes Intravenous Daily 03/11/22 0641 03/12/22 1207        I have personally reviewed the following labs and images: CBC: Recent Labs  Lab 03/10/22 2318 03/12/22 0540 03/13/22 0259 03/14/22 0158 03/15/22 0210  WBC 9.0 12.8* 9.2 8.8 8.6  HGB 14.6 14.3 14.0 14.8 14.9  HCT 44.3 44.3 41.2 44.8 44.1  MCV 94.3 94.5 91.4 92.6 90.7  PLT 156  160 153 151 152   BMP &GFR Recent Labs  Lab 03/10/22 2318 03/12/22 0540 03/13/22 0259 03/14/22 0158  NA 142 139 141 135  K 3.7 4.6 4.1 4.0  CL 107 109 106 103  CO2 '26 22 25 24  '$ GLUCOSE 131* 157* 145* 137*  BUN '20 22 21 '$ 24*  CREATININE 1.06* 1.25* 1.10* 1.02*  CALCIUM 9.1 8.9 8.8* 8.8*  MG  --  2.3  --  2.2  PHOS  --  4.1  --  3.2   Estimated Creatinine Clearance: 40 mL/min (A) (by C-G formula based on SCr of 1.02 mg/dL (H)). Liver & Pancreas: Recent Labs  Lab 03/10/22 2318 03/12/22 0540 03/14/22 0158  AST 25  --   --   ALT 14  --   --   ALKPHOS 52  --   --   BILITOT 1.0  --   --   PROT 6.4*  --   --   ALBUMIN 3.6 3.6 3.2*   No results for input(s): "LIPASE", "AMYLASE" in the last 168 hours. No results for input(s): "AMMONIA" in the last 168 hours. Diabetic: No results for input(s): "HGBA1C" in the last 72 hours. No results for input(s): "GLUCAP" in the last 168  hours. Cardiac Enzymes: No results for input(s): "CKTOTAL", "CKMB", "CKMBINDEX", "TROPONINI" in the last 168 hours. No results for input(s): "PROBNP" in the last 8760 hours. Coagulation Profile: No results for input(s): "INR", "PROTIME" in the last 168 hours. Thyroid Function Tests: No results for input(s): "TSH", "T4TOTAL", "FREET4", "T3FREE", "THYROIDAB" in the last 72 hours.  Lipid Profile: No results for input(s): "CHOL", "HDL", "LDLCALC", "TRIG", "CHOLHDL", "LDLDIRECT" in the last 72 hours.  Anemia Panel: No results for input(s): "VITAMINB12", "FOLATE", "FERRITIN", "TIBC", "IRON", "RETICCTPCT" in the last 72 hours. Urine analysis:    Component Value Date/Time   COLORURINE YELLOW 08/14/2021 [place] 08/14/2021 1604   LABSPEC 1.020 08/14/2021 1604   PHURINE 5.0 08/14/2021 1604   GLUCOSEU NEGATIVE 08/14/2021 1604   GLUCOSEU NEGATIVE 11/28/2010 1422   HGBUR NEGATIVE 08/14/2021 1604   BILIRUBINUR NEGATIVE 08/14/2021 [place] 08/14/2021 1604   PROTEINUR NEGATIVE  08/14/2021 1604   UROBILINOGEN 0.2 11/28/2010 1422   NITRITE NEGATIVE 08/14/2021 1604   LEUKOCYTESUR NEGATIVE 08/14/2021 1604   Sepsis Labs: Invalid input(s): "PROCALCITONIN", "LACTICIDVEN"  Microbiology: Recent Results (from the past 240 hour(s))  Resp panel by RT-PCR (RSV, Flu A&B, Covid) Anterior Nasal Swab     Status: Abnormal   Collection Time: 03/10/22 11:13 PM   Specimen: Anterior Nasal Swab  Result Value Ref Range Status   SARS Coronavirus 2 by RT PCR NEGATIVE NEGATIVE Final    Comment: (NOTE) SARS-CoV-2 target nucleic acids are NOT DETECTED.  The SARS-CoV-2 RNA is generally detectable in upper respiratory specimens during the acute phase of infection. The lowest concentration of SARS-CoV-2 viral copies this assay can detect is 138 copies/mL. A negative result does not preclude SARS-Cov-2 infection and should not be used as the sole basis for treatment or other patient management decisions. A negative result may occur with  improper specimen collection/handling, submission of specimen other than nasopharyngeal swab, presence of viral mutation(s) within the areas targeted by this assay, and inadequate number of viral copies(<138 copies/mL). A negative result must be combined with clinical observations, patient history, and epidemiological information. The expected result is Negative.  Fact Sheet for Patients:  EntrepreneurPulse.com.au  Fact Sheet for Healthcare Providers:  IncredibleEmployment.be  This test is no t yet approved or cleared by the [place] FDA and  has been authorized for detection and/or diagnosis of SARS-CoV-2 by FDA under an Emergency Use Authorization (EUA). This EUA will remain  in effect (meaning this test can be used) for the duration of the COVID-19 declaration under Section 564(b)(1) of the Act, 21 U.S.C.section 360bbb-3(b)(1), unless the authorization is terminated  or revoked sooner.        Influenza A by PCR NEGATIVE NEGATIVE Final   Influenza B by PCR NEGATIVE NEGATIVE Final    Comment: (NOTE) The Xpert Xpress SARS-CoV-2/FLU/RSV plus assay is intended as an aid in the diagnosis of influenza from Nasopharyngeal swab specimens and should not be used as a sole basis for treatment. Nasal washings and aspirates are unacceptable for Xpert Xpress SARS-CoV-2/FLU/RSV testing.  Fact Sheet for Patients: EntrepreneurPulse.com.au  Fact Sheet for Healthcare Providers: IncredibleEmployment.be  This test is not yet approved or cleared by the [place] FDA and has been authorized for detection and/or diagnosis of SARS-CoV-2 by FDA under an Emergency Use Authorization (EUA). This EUA will remain in effect (meaning this test can be used) for the duration of the COVID-19 declaration under Section 564(b)(1) of the Act, 21 U.S.C. section 360bbb-3(b)(1), unless the authorization is  terminated or revoked.     Resp Syncytial Virus by PCR POSITIVE (A) NEGATIVE Final    Comment: (NOTE) Fact Sheet for Patients: EntrepreneurPulse.com.au  Fact Sheet for Healthcare Providers: IncredibleEmployment.be  This test is not yet approved or cleared by the [place] FDA and has been authorized for detection and/or diagnosis of SARS-CoV-2 by FDA under an Emergency Use Authorization (EUA). This EUA will remain in effect (meaning this test can be used) for the duration of the COVID-19 declaration under Section 564(b)(1) of the Act, 21 U.S.C. section 360bbb-3(b)(1), unless the authorization is terminated or revoked.  Performed at [facility], [place] [address] [place], [place] [address]     Radiology Studies: No results found.    Jazzmon [address] T[address] [place]  If 7PM-7AM, please contact night-coverage [email or web address] 03/15/2022, 2:51 PM

## 2022-03-16 ENCOUNTER — Other Ambulatory Visit (HOSPITAL_COMMUNITY): Payer: Self-pay

## 2022-03-16 DIAGNOSIS — J210 Acute bronchiolitis due to respiratory syncytial virus: Secondary | ICD-10-CM | POA: Diagnosis not present

## 2022-03-16 DIAGNOSIS — J4489 Other specified chronic obstructive pulmonary disease: Secondary | ICD-10-CM | POA: Diagnosis not present

## 2022-03-16 DIAGNOSIS — J4551 Severe persistent asthma with (acute) exacerbation: Secondary | ICD-10-CM

## 2022-03-16 DIAGNOSIS — I2510 Atherosclerotic heart disease of native coronary artery without angina pectoris: Secondary | ICD-10-CM | POA: Diagnosis not present

## 2022-03-16 DIAGNOSIS — J441 Chronic obstructive pulmonary disease with (acute) exacerbation: Secondary | ICD-10-CM | POA: Diagnosis not present

## 2022-03-16 LAB — BASIC METABOLIC PANEL
Anion gap: 8 (ref 5–15)
BUN: 28 mg/dL — ABNORMAL HIGH (ref 8–23)
CO2: 24 mmol/L (ref 22–32)
Calcium: 9.1 mg/dL (ref 8.9–10.3)
Chloride: 104 mmol/L (ref 98–111)
Creatinine, Ser: 1.09 mg/dL — ABNORMAL HIGH (ref 0.44–1.00)
GFR, Estimated: 50 mL/min — ABNORMAL LOW (ref >60)
Glucose, Bld: 176 mg/dL — ABNORMAL HIGH (ref 70–99)
Potassium: 4.2 mmol/L (ref 3.5–5.1)
Sodium: 136 mmol/L (ref 135–145)

## 2022-03-16 LAB — CBC
HCT: 44 % (ref 36.0–46.0)
Hemoglobin: 15.3 g/dL — ABNORMAL HIGH (ref 12.0–15.0)
MCH: 31 pg (ref 26.0–34.0)
MCHC: 34.8 g/dL (ref 30.0–36.0)
MCV: 89.2 fL (ref 80.0–100.0)
Platelets: 169 10*3/uL (ref 150–400)
RBC: 4.93 MIL/uL (ref 3.87–5.11)
RDW: 13.2 % (ref 11.5–15.5)
WBC: 11.8 10*3/uL — ABNORMAL HIGH (ref 4.0–10.5)
nRBC: 0 % (ref 0.0–0.2)

## 2022-03-16 MED ORDER — ALBUTEROL SULFATE HFA 108 (90 BASE) MCG/ACT IN AERS
2.0000 | INHALATION_SPRAY | Freq: Four times a day (QID) | RESPIRATORY_TRACT | 1 refills | Status: DC | PRN
  Filled 2022-03-16: qty 18, 17d supply, fill #0

## 2022-03-16 MED ORDER — PREDNISONE 10 MG PO TABS
ORAL_TABLET | ORAL | 0 refills | Status: AC
  Filled 2022-03-16: qty 30, 8d supply, fill #0

## 2022-03-16 MED ORDER — GUAIFENESIN ER 600 MG PO TB12
600.0000 mg | ORAL_TABLET | Freq: Two times a day (BID) | ORAL | 0 refills | Status: AC
  Filled 2022-03-16: qty 10, 5d supply, fill #0

## 2022-03-16 MED ORDER — METOPROLOL SUCCINATE ER 50 MG PO TB24
50.0000 mg | ORAL_TABLET | Freq: Every day | ORAL | 1 refills | Status: DC
  Filled 2022-03-16: qty 30, 30d supply, fill #0

## 2022-03-16 MED ORDER — ASPIRIN 81 MG PO TBEC
81.0000 mg | DELAYED_RELEASE_TABLET | Freq: Every day | ORAL | 12 refills | Status: DC
  Filled 2022-03-16: qty 30, 30d supply, fill #0

## 2022-03-16 MED ORDER — EZETIMIBE 10 MG PO TABS
10.0000 mg | ORAL_TABLET | Freq: Every day | ORAL | 1 refills | Status: DC
  Filled 2022-03-16: qty 30, 30d supply, fill #0

## 2022-03-16 MED ORDER — PANTOPRAZOLE SODIUM 40 MG PO TBEC
40.0000 mg | DELAYED_RELEASE_TABLET | Freq: Every day | ORAL | 2 refills | Status: DC
  Filled 2022-03-16: qty 30, 30d supply, fill #0

## 2022-03-16 NOTE — Discharge Summary (Signed)
Physician Discharge Summary  Sonia [name] YJE:[id number] DOB: 1940/06/08 DOA: 03/10/2022  PCP: Jonathon Jordan, MD  Admit date: 03/10/2022 Discharge date: 03/16/2022 Admitted From: Home Disposition: Home Recommendations for Outpatient Follow-up:  Follow up with PCP in 1 to 2 weeks Outpatient follow-up with cardiology as below Outpatient follow-up with pulmonology Check CMP and CBC at follow-up Please follow up on the following pending results: None  Home Health: Not indicated Equipment/Devices: Not indicated  Discharge Condition: Stable CODE STATUS: Full code  Follow-up Information     Richardson Dopp T, PA-C Follow up.   Specialties: Cardiology, Physician Assistant Why: Hospital follow-up with Cardiology scheduled for 03/28/2022 at 3:10pm. Please arrive 15 minutes early for check-in. If this date/time does not work for you, please call our office to reschedule. Contact information: [address] [place] [address] [phone]         Jonathon Jordan, MD. Schedule an appointment as soon as possible for a visit in 1 week(s).   Specialty: Family Medicine Contact information: [place] [address] [facility] 82 year old F with PMH of CAD s/p RCA stent, COPD, asthma, PMR on chronic steroid, hypothyroidism, fibromyalgia and malignant melanoma presenting with shortness of breath, wheezing and URI symptoms and admitted for COPD exacerbation in the setting of RSV infection and NSTEMI.  Troponin 470>> 1063.  EKG without acute ischemic finding.  She was started on IV heparin, Solu-Medrol, antibiotics and nebulizers.  Cardiology consulted.   Bacterial pneumonia felt to be less likely.  Antibiotics discontinued.  Continued on IV heparin and COPD treatments. Cardiology offered heart catheterization but she declined unless it is done by her own cardiologist.  Heparin discontinued.   She is discharged on Toprol-XL, low-dose aspirin and Zetia..  Ambulatory referral to lipid clinic ordered   In regards to COPD/asthma exacerbation, symptoms improved.  She is discharged on prednisone taper followed by home low-dose prednisone.  Renewed prescription for Symbicort and albuterol.  See individual problem list below for more.   Problems addressed during this hospitalization Principal Problem:   COPD with acute exacerbation ([place]) Active Problems:   Dyslipidemia   COPD with asthma ([place])   Hypothyroidism   History of melanoma   CAD S/P percutaneous coronary angioplasty   Carcinoma of upper-outer quadrant of right breast in female, estrogen receptor positive ([place])   Non-STEMI (non-ST elevated myocardial infarction) ([place])   RSV (respiratory syncytial virus pneumonia)   Acute bronchiolitis due to respiratory syncytial virus (RSV)              Vital signs Vitals:   03/16/22 0753 03/16/22 0754 03/16/22 0900 03/16/22 1100  BP:   122/86 125/89  Pulse:    85  Temp:    98.4 F (36.9 C)  Resp:    18  Height:      Weight:      SpO2: 97% 96%  99%  TempSrc:    Oral  BMI (Calculated):         Discharge exam  GENERAL: No apparent distress.  Nontoxic. HEENT: MMM.  Vision and hearing grossly intact.  NECK: Supple.  No apparent JVD.  RESP:  No IWOB.  Fair aeration bilaterally.  Intermittently cough with deep inhalation CVS:  RRR. Heart sounds normal.  ABD/GI/GU: BS+. Abd soft, NTND.  MSK/EXT:  Moves extremities. No apparent deformity. No edema.  SKIN: no apparent skin  lesion or wound NEURO: Awake and alert. Oriented appropriately.  No apparent focal neuro deficit. PSYCH: Calm. Normal affect.   Discharge Instructions Discharge Instructions     AMB Referral to Advanced Lipid Disorders Clinic   Complete by: As directed    Internal Lipid Clinic Referral Scheduling  Internal lipid clinic referrals are providers within [facility], who wish to refer established patients for  routine management (help in starting PCSK9 inhibitor therapy) or advanced therapies.  Internal MD referral criteria:              1. All patients with LDL>190 mg/dL  2. All patients with Triglycerides >500 mg/dL  3. Patients with suspected or confirmed heterozygous familial hyperlipidemia (HeFH) or homozygous familial hyperlipidemia (HoFH)  4. Patients with family history of suspicious for genetic dyslipidemia desiring genetic testing  5. Patients refractory to standard guideline based therapy  6. Patients with statin intolerance (failed 2 statins, one of which must be a high potency statin)  7. Patients who the provider desires to be seen by MD   Internal PharmD referral criteria:   1. Follow-up patients for medication management  2. Follow-up for compliance monitoring  3. Patients for drug education  4. Patients with statin intolerance  5. PCSK9 inhibitor education and prior authorization approvals  6. Patients with triglycerides <500 mg/dL  External Lipid Clinic Referral  External lipid clinic referrals are for providers outside of [place], considered new clinic patients - automatically routed to MD schedule   Call MD for:  difficulty breathing, headache or visual disturbances   Complete by: As directed    Call MD for:  extreme fatigue   Complete by: As directed    Call MD for:  persistant dizziness or light-headedness   Complete by: As directed    Diet - low sodium heart healthy   Complete by: As directed    Discharge instructions   Complete by: As directed    It has been a pleasure taking care of you!  You were hospitalized due to asthma exacerbation and possible heart attack.  You have been treated with steroid and breathing treatments for asthma.  We are discharging you more steroid and breathing treatments.  Please use your medications as prescribed.  You may hold your home prednisone until you complete the prednisone taper.  Follow-up with your primary care doctor  in 1 to 2 weeks or sooner if needed.  We also recommend follow-up with your pulmonologist.  In regards to your heart, follow-up with your cardiologist per cardiology recommendation.  Review your new medication list and the directions on your medications before you take them.   Take care,   Increase activity slowly   Complete by: As directed       Allergies as of 03/16/2022       Reactions   Adenosine Anaphylaxis   Contrast Media [iodinated Contrast Media] Anaphylaxis   Influenza Virus Vaccine Anaphylaxis   Nitrofurantoin Itching   Milk (cow)    Other reaction(s): Other Other Reaction: Other reaction   Breztri Aerosphere [budeson-glycopyrrol-formoterol] Other (See Comments)   Urinary retention.   Codeine Sulfate Nausea And Vomiting   Latex Other (See Comments)   Skin irritation        Medication List     TAKE these medications    albuterol (2.5 MG/3ML) 0.083% nebulizer solution Commonly known as: PROVENTIL Take 3 mLs (2.5 mg total) by nebulization every 6 (six) hours as needed for wheezing or shortness of breath. What changed: additional instructions  Ventolin HFA 108 (90 Base) MCG/ACT inhaler Generic drug: albuterol Inhale 2 puffs into the lungs every 6 (six) hours as needed for wheezing or shortness of breath. May inhale 2 puffs every 4 hours if needed What changed:  how much to take how to take this when to take this reasons to take this additional instructions   alendronate 70 MG tablet Commonly known as: FOSAMAX Take 70 mg by mouth once a week.   Aspirin Low Dose 81 MG tablet Generic drug: aspirin EC Take 1 tablet (81 mg total) by mouth daily. Swallow whole. Start taking on: March 17, 2022   B-12 5000 MCG Caps Place 5,000 mcg under the tongue 3 (three) times a week.   calcium carbonate 500 MG chewable tablet Commonly known as: TUMS - dosed in mg elemental calcium Chew 500 mg by mouth daily as needed for indigestion or heartburn.   Cholecalciferol  25 MCG (1000 UT) capsule Take 1,500 Units by mouth 3 (three) times a week.   ezetimibe 10 MG tablet Commonly known as: ZETIA Take 1 tablet (10 mg total) by mouth daily. Start taking on: March 17, 2022   famotidine 20 MG tablet Commonly known as: Pepcid One after supper   guaiFENesin 600 MG 12 hr tablet Commonly known as: MUCINEX Take 1 tablet (600 mg total) by mouth 2 (two) times daily for 5 days.   levothyroxine 125 MCG tablet Commonly known as: SYNTHROID Take 125 mcg by mouth daily before breakfast.   metoprolol succinate 50 MG 24 hr tablet Commonly known as: TOPROL-XL Take 1 tablet (50 mg total) by mouth daily. Take with or immediately following a meal. Start taking on: March 17, 2022   montelukast 10 MG tablet Commonly known as: SINGULAIR Take 1 tablet (10 mg total) by mouth at bedtime.   nitroGLYCERIN 0.4 MG SL tablet Commonly known as: NITROSTAT Place 0.4 mg under the tongue every 5 (five) minutes x 3 doses as needed for chest pain.   pantoprazole 40 MG tablet Commonly known as: Protonix Take 1 tablet (40 mg total) by mouth daily. Take 30-60 min before first meal of the day   predniSONE 1 MG tablet Commonly known as: DELTASONE Take 2 mg by mouth daily. What changed: Another medication with the same name was changed. Make sure you understand how and when to take each.   predniSONE 5 MG tablet Commonly known as: DELTASONE Take 5 mg by mouth daily. What changed: Another medication with the same name was changed. Make sure you understand how and when to take each.   predniSONE 10 MG tablet Commonly known as: DELTASONE Take 4 tablets (40 mg total) by mouth daily for 2 days, THEN 3 tablets (30 mg total) daily for 2 days, THEN 2 tablets (20 mg total) daily for 2 days, THEN 1 tablet (10 mg total) daily for 2 days. Start taking on: March 16, 2022 What changed:  medication strength See the new instructions.   Symbicort 160-4.5 MCG/ACT inhaler Generic drug:  budesonide-formoterol Inhale 2 puffs into the lungs in the morning and at bedtime.        Consultations: Cardiology  Procedures/Studies:    ECHOCARDIOGRAM COMPLETE  Result Date: 03/11/2022    ECHOCARDIOGRAM REPORT   Patient Name:   Sonia [name] Date of Exam: 03/11/2022 Medical Rec #:  [id number]                Height:       64.0 in Accession #:    [id number]  Weight:       153.6 lb Date of Birth:  1940/10/01                BSA:          1.749 m Patient Age:    72 years                 BP:           96/79 mmHg Patient Gender: F                        HR:           107 bpm. Exam Location:  Inpatient Procedure: 2D Echo, Cardiac Doppler and Color Doppler STAT ECHO Indications:    Elevated Troponin  History:        Patient has prior history of Echocardiogram examinations, most                 recent 07/17/2015. CAD and Previous Myocardial Infarction; COPD.                 Breast cancer.  Sonographer:    Darlina Sicilian RDCS Referring Phys: [phone] DEBBY CROSLEY  Sonographer Comments: Hiatal hernia IMPRESSIONS  1. Left ventricular ejection fraction, by estimation, is >75%. The left ventricle has hyperdynamic function. The left ventricle has no regional wall motion abnormalities. Left ventricular diastolic parameters are consistent with Grade I diastolic dysfunction (impaired relaxation).  2. Right ventricular systolic function is normal. The right ventricular size is normal. Tricuspid regurgitation signal is inadequate for assessing PA pressure.  3. The mitral valve is degenerative. No evidence of mitral valve regurgitation. Mild mitral stenosis. The mean mitral valve gradient is 4.5 mmHg.  4. The aortic valve is calcified. Aortic valve regurgitation is not visualized. Aortic valve sclerosis/calcification is present, without any evidence of aortic stenosis.  5. The inferior vena cava is normal in size with greater than 50% respiratory variability, suggesting right atrial pressure of 3  mmHg. Conclusion(s)/Recommendation(s): Normal for age. FINDINGS  Left Ventricle: Left ventricular ejection fraction, by estimation, is >75%. The left ventricle has hyperdynamic function. The left ventricle has no regional wall motion abnormalities. The left ventricular internal cavity size was small. There is no left  ventricular hypertrophy. Left ventricular diastolic parameters are consistent with Grade I diastolic dysfunction (impaired relaxation). Right Ventricle: The right ventricular size is normal. No increase in right ventricular wall thickness. Right ventricular systolic function is normal. Tricuspid regurgitation signal is inadequate for assessing PA pressure. Left Atrium: Left atrial size was normal in size. Right Atrium: Right atrial size was normal in size. Pericardium: Trivial pericardial effusion is present. Mitral Valve: The mitral valve is degenerative in appearance. There is moderate thickening of the mitral valve leaflet(s). There is moderate calcification of the mitral valve leaflet(s). Mild to moderate mitral annular calcification. No evidence of mitral valve regurgitation. Mild mitral valve stenosis. MV peak gradient, 10.0 mmHg. The mean mitral valve gradient is 4.5 mmHg. Tricuspid Valve: The tricuspid valve is grossly normal. Tricuspid valve regurgitation is not demonstrated. No evidence of tricuspid stenosis. Aortic Valve: The aortic valve is calcified. Aortic valve regurgitation is not visualized. Aortic valve sclerosis/calcification is present, without any evidence of aortic stenosis. Pulmonic Valve: The pulmonic valve was grossly normal. Pulmonic valve regurgitation is not visualized. No evidence of pulmonic stenosis. Aorta: The aortic root and ascending aorta are structurally normal, with no evidence of dilitation. Venous: The inferior vena cava is normal  in size with greater than 50% respiratory variability, suggesting right atrial pressure of 3 mmHg. IAS/Shunts: No atrial level shunt  detected by color flow Doppler.  LEFT VENTRICLE PLAX 2D LVIDd:         3.90 cm     Diastology LVIDs:         2.40 cm     LV e' medial:    5.55 cm/s LV PW:         0.90 cm     LV E/e' medial:  20.9 LV IVS:        0.90 cm     LV e' lateral:   6.09 cm/s LVOT diam:     2.00 cm     LV E/e' lateral: 19.0 LV SV:         68 LV SV Index:   39 LVOT Area:     3.14 cm  LV Volumes (MOD) LV vol d, MOD A2C: 50.5 ml LV vol d, MOD A4C: 75.5 ml LV vol s, MOD A2C: 12.4 ml LV vol s, MOD A4C: 18.8 ml LV SV MOD A2C:     38.1 ml LV SV MOD A4C:     75.5 ml LV SV MOD BP:      48.1 ml RIGHT VENTRICLE TAPSE (M-mode): 1.5 cm LEFT ATRIUM             Index        RIGHT ATRIUM           Index LA diam:        2.90 cm 1.66 cm/m   RA Area:     10.90 cm LA Vol (A2C):   38.0 ml 21.73 ml/m  RA Volume:   21.20 ml  12.12 ml/m LA Vol (A4C):   41.2 ml 23.56 ml/m LA Biplane Vol: 41.9 ml 23.96 ml/m  AORTIC VALVE LVOT Vmax:   96.20 cm/s LVOT Vmean:  67.500 cm/s LVOT VTI:    0.217 m  AORTA Ao Root diam: 2.50 cm Ao Asc diam:  2.60 cm MITRAL VALVE MV Area (PHT): 3.63 cm     SHUNTS MV Area VTI:   2.85 cm     Systemic VTI:  0.22 m MV Peak grad:  10.0 mmHg    Systemic Diam: 2.00 cm MV Mean grad:  4.5 mmHg MV Vmax:       1.58 m/s MV Vmean:      99.4 cm/s MV Decel Time: 209 msec MV E velocity: 116.00 cm/s MV A velocity: 150.00 cm/s MV E/A ratio:  0.77 Mary Scientist, physiological signed by Phineas Inches Signature Date/Time: 03/11/2022/8:42:21 AM    Final    DG Chest Port 1 View  Result Date: 03/11/2022 CLINICAL DATA:  Shortness of breath EXAM: PORTABLE CHEST 1 VIEW COMPARISON:  08/14/2021 FINDINGS: Large hiatal hernia is noted. Cardiac shadow is stable. Aortic calcifications are seen. Lungs are well aerated bilaterally. No focal infiltrate or effusion is seen. No bony abnormality is noted. IMPRESSION: Hiatal hernia.  No acute abnormality noted. Electronically Signed   By: Inez Catalina M.D.   On: 03/11/2022 00:00       The results of significant  diagnostics from this hospitalization (including imaging, microbiology, ancillary and laboratory) are listed below for reference.     Microbiology: Recent Results (from the past 240 hour(s))  Resp panel by RT-PCR (RSV, Flu A&B, Covid) Anterior Nasal Swab     Status: Abnormal   Collection Time: 03/10/22 11:13 PM   Specimen: Anterior Nasal Swab  Result Value Ref Range Status   SARS Coronavirus 2 by RT PCR NEGATIVE NEGATIVE Final    Comment: (NOTE) SARS-CoV-2 target nucleic acids are NOT DETECTED.  The SARS-CoV-2 RNA is generally detectable in upper respiratory specimens during the acute phase of infection. The lowest concentration of SARS-CoV-2 viral copies this assay can detect is 138 copies/mL. A negative result does not preclude SARS-Cov-2 infection and should not be used as the sole basis for treatment or other patient management decisions. A negative result may occur with  improper specimen collection/handling, submission of specimen other than nasopharyngeal swab, presence of viral mutation(s) within the areas targeted by this assay, and inadequate number of viral copies(<138 copies/mL). A negative result must be combined with clinical observations, patient history, and epidemiological information. The expected result is Negative.  Fact Sheet for Patients:  EntrepreneurPulse.com.au  Fact Sheet for Healthcare Providers:  IncredibleEmployment.be  This test is no t yet approved or cleared by the [place] FDA and  has been authorized for detection and/or diagnosis of SARS-CoV-2 by FDA under an Emergency Use Authorization (EUA). This EUA will remain  in effect (meaning this test can be used) for the duration of the COVID-19 declaration under Section 564(b)(1) of the Act, 21 U.S.C.section 360bbb-3(b)(1), unless the authorization is terminated  or revoked sooner.       Influenza A by PCR NEGATIVE NEGATIVE Final   Influenza B by PCR  NEGATIVE NEGATIVE Final    Comment: (NOTE) The Xpert Xpress SARS-CoV-2/FLU/RSV plus assay is intended as an aid in the diagnosis of influenza from Nasopharyngeal swab specimens and should not be used as a sole basis for treatment. Nasal washings and aspirates are unacceptable for Xpert Xpress SARS-CoV-2/FLU/RSV testing.  Fact Sheet for Patients: EntrepreneurPulse.com.au  Fact Sheet for Healthcare Providers: IncredibleEmployment.be  This test is not yet approved or cleared by the [place] FDA and has been authorized for detection and/or diagnosis of SARS-CoV-2 by FDA under an Emergency Use Authorization (EUA). This EUA will remain in effect (meaning this test can be used) for the duration of the COVID-19 declaration under Section 564(b)(1) of the Act, 21 U.S.C. section 360bbb-3(b)(1), unless the authorization is terminated or revoked.     Resp Syncytial Virus by PCR POSITIVE (A) NEGATIVE Final    Comment: (NOTE) Fact Sheet for Patients: EntrepreneurPulse.com.au  Fact Sheet for Healthcare Providers: IncredibleEmployment.be  This test is not yet approved or cleared by the [place] FDA and has been authorized for detection and/or diagnosis of SARS-CoV-2 by FDA under an Emergency Use Authorization (EUA). This EUA will remain in effect (meaning this test can be used) for the duration of the COVID-19 declaration under Section 564(b)(1) of the Act, 21 U.S.C. section 360bbb-3(b)(1), unless the authorization is terminated or revoked.  Performed at [facility], [place] [address], [place], [place] [address]      Labs:  CBC: Recent Labs  Lab 03/12/22 0540 03/13/22 0259 03/14/22 0158 03/15/22 0210 03/16/22 0318  WBC 12.8* 9.2 8.8 8.6 11.8*  HGB 14.3 14.0 14.8 14.9 15.3*  HCT 44.3 41.2 44.8 44.1 44.0  MCV 94.5 91.4 92.6 90.7 89.2  PLT 160 153 151 152 169   BMP &GFR Recent Labs  Lab  03/10/22 2318 03/12/22 0540 03/13/22 0259 03/14/22 0158 03/16/22 0318  NA 142 139 141 135 136  K 3.7 4.6 4.1 4.0 4.2  CL 107 109 106 103 104  CO2 '26 22 25 24 24  '$ GLUCOSE 131* 157* 145* 137* 176*  BUN  $'20 22 21 'R$ 24* 28*  CREATININE 1.06* 1.25* 1.10* 1.02* 1.09*  CALCIUM 9.1 8.9 8.8* 8.8* 9.1  MG  --  2.3  --  2.2  --   PHOS  --  4.1  --  3.2  --    Estimated Creatinine Clearance: 37.5 mL/min (A) (by C-G formula based on SCr of 1.09 mg/dL (H)). Liver & Pancreas: Recent Labs  Lab 03/10/22 2318 03/12/22 0540 03/14/22 0158  AST 25  --   --   ALT 14  --   --   ALKPHOS 52  --   --   BILITOT 1.0  --   --   PROT 6.4*  --   --   ALBUMIN 3.6 3.6 3.2*   No results for input(s): "LIPASE", "AMYLASE" in the last 168 hours. No results for input(s): "AMMONIA" in the last 168 hours. Diabetic: No results for input(s): "HGBA1C" in the last 72 hours. No results for input(s): "GLUCAP" in the last 168 hours. Cardiac Enzymes: No results for input(s): "CKTOTAL", "CKMB", "CKMBINDEX", "TROPONINI" in the last 168 hours. No results for input(s): "PROBNP" in the last 8760 hours. Coagulation Profile: No results for input(s): "INR", "PROTIME" in the last 168 hours. Thyroid Function Tests: No results for input(s): "TSH", "T4TOTAL", "FREET4", "T3FREE", "THYROIDAB" in the last 72 hours. Lipid Profile: No results for input(s): "CHOL", "HDL", "LDLCALC", "TRIG", "CHOLHDL", "LDLDIRECT" in the last 72 hours. Anemia Panel: No results for input(s): "VITAMINB12", "FOLATE", "FERRITIN", "TIBC", "IRON", "RETICCTPCT" in the last 72 hours. Urine analysis:    Component Value Date/Time   COLORURINE YELLOW 08/14/2021 [place] 08/14/2021 1604   LABSPEC 1.020 08/14/2021 1604   PHURINE 5.0 08/14/2021 1604   GLUCOSEU NEGATIVE 08/14/2021 1604   GLUCOSEU NEGATIVE 11/28/2010 1422   HGBUR NEGATIVE 08/14/2021 1604   BILIRUBINUR NEGATIVE 08/14/2021 [place] 08/14/2021 1604   PROTEINUR  NEGATIVE 08/14/2021 1604   UROBILINOGEN 0.2 11/28/2010 1422   NITRITE NEGATIVE 08/14/2021 1604   LEUKOCYTESUR NEGATIVE 08/14/2021 1604   Sepsis Labs: Invalid input(s): "PROCALCITONIN", "LACTICIDVEN"   SIGNED:  Mercy Riding, MD  [facility] 03/16/2022, 5:58 PM

## 2022-03-16 NOTE — Care Management Important Message (Signed)
Important Message  Patient Details  Name: Sonia [name] MRN: [id number] Date of Birth: 09/11/1940   Medicare Important Message Given:  Yes     Shelda Altes 03/16/2022, 9:58 AM

## 2022-03-23 ENCOUNTER — Telehealth: Payer: Self-pay | Admitting: Cardiovascular Disease

## 2022-03-23 NOTE — Telephone Encounter (Signed)
Patient would like a call back to schedule a visit directly with Dr. Angelena Form to discuss stents.

## 2022-03-28 ENCOUNTER — Ambulatory Visit: Payer: Medicare Other | Admitting: Physician Assistant

## 2022-03-28 NOTE — Telephone Encounter (Signed)
Called patient and scheduled her next appointment with Dr. Angelena Form in Feb.  Last seen 2021.

## 2022-04-04 ENCOUNTER — Other Ambulatory Visit (HOSPITAL_BASED_OUTPATIENT_CLINIC_OR_DEPARTMENT_OTHER): Payer: Self-pay | Admitting: Family Medicine

## 2022-04-04 ENCOUNTER — Ambulatory Visit (HOSPITAL_BASED_OUTPATIENT_CLINIC_OR_DEPARTMENT_OTHER)
Admission: RE | Admit: 2022-04-04 | Discharge: 2022-04-04 | Disposition: A | Discharge status: Home / Self Care | Payer: Medicare Other | Source: Ambulatory Visit | Attending: Family Medicine | Admitting: Family Medicine

## 2022-04-04 DIAGNOSIS — R1011 Right upper quadrant pain: Secondary | ICD-10-CM | POA: Diagnosis not present

## 2022-04-04 DIAGNOSIS — R051 Acute cough: Secondary | ICD-10-CM

## 2022-04-04 DIAGNOSIS — B974 Respiratory syncytial virus as the cause of diseases classified elsewhere: Secondary | ICD-10-CM | POA: Diagnosis not present

## 2022-04-04 DIAGNOSIS — I700 Atherosclerosis of aorta: Secondary | ICD-10-CM | POA: Diagnosis not present

## 2022-04-04 DIAGNOSIS — R059 Cough, unspecified: Secondary | ICD-10-CM | POA: Diagnosis not present

## 2022-04-04 DIAGNOSIS — J441 Chronic obstructive pulmonary disease with (acute) exacerbation: Secondary | ICD-10-CM | POA: Diagnosis not present

## 2022-04-04 DIAGNOSIS — I214 Non-ST elevation (NSTEMI) myocardial infarction: Secondary | ICD-10-CM | POA: Diagnosis not present

## 2022-04-04 DIAGNOSIS — R739 Hyperglycemia, unspecified: Secondary | ICD-10-CM | POA: Diagnosis not present

## 2022-04-08 ENCOUNTER — Emergency Department (HOSPITAL_COMMUNITY)
Admission: EM | Admit: 2022-04-08 | Discharge: 2022-04-08 | Discharge status: Against Medical Advice | Payer: Medicare Other | Attending: Emergency Medicine | Admitting: Emergency Medicine

## 2022-04-08 ENCOUNTER — Emergency Department (HOSPITAL_COMMUNITY): Payer: Medicare Other

## 2022-04-08 ENCOUNTER — Other Ambulatory Visit: Payer: Self-pay

## 2022-04-08 DIAGNOSIS — I672 Cerebral atherosclerosis: Secondary | ICD-10-CM | POA: Diagnosis not present

## 2022-04-08 DIAGNOSIS — R0602 Shortness of breath: Secondary | ICD-10-CM | POA: Diagnosis not present

## 2022-04-08 DIAGNOSIS — I6782 Cerebral ischemia: Secondary | ICD-10-CM | POA: Insufficient documentation

## 2022-04-08 DIAGNOSIS — K449 Diaphragmatic hernia without obstruction or gangrene: Secondary | ICD-10-CM | POA: Insufficient documentation

## 2022-04-08 DIAGNOSIS — Z5321 Procedure and treatment not carried out due to patient leaving prior to being seen by health care provider: Secondary | ICD-10-CM | POA: Diagnosis not present

## 2022-04-08 DIAGNOSIS — R0789 Other chest pain: Secondary | ICD-10-CM | POA: Insufficient documentation

## 2022-04-08 DIAGNOSIS — R059 Cough, unspecified: Secondary | ICD-10-CM | POA: Diagnosis not present

## 2022-04-08 DIAGNOSIS — R079 Chest pain, unspecified: Secondary | ICD-10-CM | POA: Diagnosis not present

## 2022-04-08 LAB — CBC
HCT: 45.3 % (ref 36.0–46.0)
Hemoglobin: 14.7 g/dL (ref 12.0–15.0)
MCH: 30.7 pg (ref 26.0–34.0)
MCHC: 32.5 g/dL (ref 30.0–36.0)
MCV: 94.6 fL (ref 80.0–100.0)
Platelets: 192 10*3/uL (ref 150–400)
RBC: 4.79 MIL/uL (ref 3.87–5.11)
RDW: 13.9 % (ref 11.5–15.5)
WBC: 8.2 10*3/uL (ref 4.0–10.5)
nRBC: 0 % (ref 0.0–0.2)

## 2022-04-08 LAB — BASIC METABOLIC PANEL
Anion gap: 6 (ref 5–15)
BUN: 12 mg/dL (ref 8–23)
CO2: 29 mmol/L (ref 22–32)
Calcium: 9.4 mg/dL (ref 8.9–10.3)
Chloride: 104 mmol/L (ref 98–111)
Creatinine, Ser: 1.11 mg/dL — ABNORMAL HIGH (ref 0.44–1.00)
GFR, Estimated: 49 mL/min — ABNORMAL LOW (ref >60)
Glucose, Bld: 96 mg/dL (ref 70–99)
Potassium: 4.1 mmol/L (ref 3.5–5.1)
Sodium: 139 mmol/L (ref 135–145)

## 2022-04-08 LAB — TROPONIN I (HIGH SENSITIVITY): Troponin I (High Sensitivity): 11 ng/L (ref <18)

## 2022-04-08 NOTE — ED Provider Triage Note (Signed)
  Emergency Medicine Provider Triage Evaluation Note  MRN:  [id number]  Arrival date & time: 04/08/22    Medically screening exam initiated at 1:35 AM.   CC:   Chest Tightness / SOB    HPI:  Sonia [name] is a 82 y.o. year-old female presents to the ED with chief complaint of chest tightness, SOB, and that her balance has been off.    History provided by patient. ROS:  -As included in HPI PE:   Vitals:   04/08/22 0124  BP: (!) 192/94  Pulse: (!) 101  Resp: 16  Temp: 97.8 F (36.6 C)  SpO2: 98%    Non-toxic appearing No respiratory distress  MDM:  Based on signs and symptoms, ACS is highest on my differential, followed by asthma exacerbation. I've ordered labs and imaging in triage to expedite lab/diagnostic workup.  Patient was informed that the remainder of the evaluation will be completed by another provider, this initial triage assessment does not replace that evaluation, and the importance of remaining in the ED until their evaluation is complete.    Montine Circle, PA-C 04/08/22 [phone]

## 2022-04-08 NOTE — ED Triage Notes (Signed)
Patient reports worsening chest tightness with SOB , wheezing/productive cough and fatigue onset this week .

## 2022-04-10 ENCOUNTER — Telehealth: Payer: Self-pay | Admitting: Cardiovascular Disease

## 2022-04-10 NOTE — Telephone Encounter (Signed)
Patient c/o Palpitations:  High priority if patient c/o lightheadedness, shortness of breath, or chest pain  How long have you had palpitations/irregular HR/ Afib? Are you having the symptoms now? Since 12/30-01/05 hospital visit is not having symptoms now   Are you currently experiencing lightheadedness, SOB or CP? No   Do you have a history of afib (atrial fibrillation) or irregular heart rhythm? Yes  Have you checked your BP or HR? (document readings if available): No   Are you experiencing any other symptoms? Very tired    Is wanting to be worked in to see [employer]

## 2022-04-10 NOTE — Telephone Encounter (Signed)
6 different people came in to the hospital to see her advising her to get a cath and possibly a stent due to a heart attack occurring from blockage in the front of the heart.   At the time she was very sick with asthma, RSV  and since Dr. Angelena Form was not available she deferred.  She does not recall symptoms of heart attack recently, other than being sick w respiratory illnesses, she only recalls severe abdominal pain for a few minutes so she is not sure when the heart attack happened. She also remembers that occasionally she would get intermittent numbness in one or the other arm, not every day.  I scheduled her this Friday am with Dr. Angelena Form.  She was last seen in the office 06/2019.

## 2022-04-12 NOTE — Progress Notes (Signed)
Chief Complaint  Patient presents with   Follow-up    CAD   History of Present Illness: 82 yo female with history of CAD, COPD, GERD and hypothyroidism who is here today for cardiac follow up. She was admitted to [facility] May 2018 with c/o left arm pain and found to have a NSTEMI. Cardiac cath May 2018 with severe stenosis in the mid RCA treated with a drug eluting stent. There was a moderate stenosis in the mid LAD. Echo May 2018 with normal LV systolic function, mild valve disease. She was discharged on Brilinta and readmitted one week later with a GI bleed. EGD showed gastritis. She was changed from Brilinta to Plavix and bleeding resolved. Chest pain and dyspnea at times felt to be due to her asthma as it improved with her inhalers. She has not tolerated statins. She cancelled a stress test arranged in March 2019. She was found to have breast cancer and has undergone lumpectomy in 2020.  She had left arm pain in early 2021. Cardiac cath 04/14/19 with non-obstructive disease in the LAD and Circumflex and patent stent in the RCA with minimal restenosis. LV systolic function was normal. Echo 03/11/22 with LVEF>75%. Mild mitral stenosis. She was admitted to [facility] 03/11/22 with dyspnea and was felt to have RSV and COPD exacerbation. She was followed by cardiology while admitted. Troponin elevated to 1063. She declined cardiac cath. She presented to the ED again on 04/08/22 with chest pain and troponin was normal. She did not want to wait and left before she was seen by a doctor. She called our office to be seen after leaving the ED.   She is here today for follow up. She tells me today that she has had fatigue following her recent RSV infection. Occasional chest pressure. The patient denies any exertional pain. No palpitations, lower extremity edema, orthopnea, PND, dizziness, near syncope or syncope. She does not wish to take Toprol.    Primary Care Physician: Jonathon Jordan, MD  Past Medical History:   Diagnosis Date   Arthritis    Asthma    daily and prn inhalers; states good control currently   Breast cancer [facility])    right   CAD S/P percutaneous coronary angioplasty 07/15/2016   COPD (chronic obstructive pulmonary disease) (HCC)    Dyspnea    occ with exertion   Fibromyalgia    GERD (gastroesophageal reflux disease)    no current med.   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   Vitamin D deficiency     Past Surgical History:  Procedure Laterality Date   ABDOMINAL HYSTERECTOMY     partial   APPENDECTOMY     BREAST BIOPSY Left    BREAST CYST EXCISION Left    BREAST EXCISIONAL BIOPSY Left 03/2015   BREAST LUMPECTOMY Right 08/2018   BREAST LUMPECTOMY WITH RADIOACTIVE SEED LOCALIZATION Left 04/07/2015   Procedure: BREAST LUMPECTOMY WITH RADIOACTIVE SEED LOCALIZATION;  Surgeon: Erroll Luna, MD;  Location: [place];  Service: General;  Laterality: Left;   BREAST LUMPECTOMY WITH RADIOACTIVE SEED LOCALIZATION Right 08/13/2018  Procedure: RIGHT BREAST LUMPECTOMY WITH RADIOACTIVE SEED LOCALIZATION;  Surgeon: Rolm Bookbinder, MD;  Location: [place];  Service: General;  Laterality: Right;   COLONOSCOPY     CORONARY STENT INTERVENTION N/A 07/15/2016   Procedure: Coronary Stent Intervention;  Surgeon: Sherren Mocha, MD;  Location: [place] CV [facility];  Service: Cardiovascular;  Laterality: N/A;   ESOPHAGOGASTRODUODENOSCOPY (EGD) WITH PROPOFOL Left 07/25/2016   Procedure: ESOPHAGOGASTRODUODENOSCOPY (EGD) WITH PROPOFOL;  Surgeon: Ronnette Juniper, MD;  Location: [place];  Service: Gastroenterology;  Laterality:  Left;   KNEE ARTHROSCOPY Right 12/07/2003   LAPAROSCOPIC SALPINGO OOPHERECTOMY Right 09/08/2003   LEFT HEART CATH N/A 07/15/2016   Procedure: Left Heart Cath;  Surgeon: Sherren Mocha, MD;  Location: [place] [facility];  Service: Cardiovascular;  Laterality: N/A;   LEFT HEART CATH AND CORONARY ANGIOGRAPHY N/A 04/14/2019   Procedure: LEFT HEART CATH AND CORONARY ANGIOGRAPHY;  Surgeon: Burnell Blanks, MD;  Location: [place] [facility];  Service: Cardiovascular;  Laterality: N/A;   LYSIS OF ADHESION  09/08/2003   NASAL SINUS SURGERY  age 51   RECTAL POLYPECTOMY  10/02/2006    Current Outpatient Medications  Medication Sig Dispense Refill   albuterol (PROVENTIL) (2.5 MG/3ML) 0.083% nebulizer solution Take 3 mLs (2.5 mg total) by nebulization every 6 (six) hours as needed for wheezing or shortness of breath. (Patient taking differently: Take 2.5 mg by nebulization every 6 (six) hours as needed for wheezing or shortness of breath. Use 1 nebulizer (2.5 mg) (scheduled) in the morning & may use every 6 hours as needed for wheezing/shortness of breath.) 360 mL 11   albuterol (VENTOLIN HFA) 108 (90 Base) MCG/ACT inhaler Inhale 2 puffs into the lungs every 6 (six) hours as needed for wheezing or shortness of breath. May inhale 2 puffs every 4 hours if needed 18 g 1   alendronate (FOSAMAX) 70 MG tablet Take 70 mg by mouth once a week.     aspirin EC 81 MG tablet Take 1 tablet (81 mg total) by mouth daily. Swallow whole. 30 tablet 12   calcium carbonate (TUMS - DOSED IN MG ELEMENTAL CALCIUM) 500 MG chewable tablet Chew 500 mg by mouth daily as needed for indigestion or heartburn.     Cholecalciferol 25 MCG (1000 UT) capsule Take 1,500 Units by mouth 3 (three) times a week.     Cyanocobalamin (B-12) 5000 MCG CAPS Place 5,000 mcg under the tongue 3 (three) times a week.     ezetimibe (ZETIA) 10 MG tablet Take 1 tablet (10 mg total) by mouth daily. 90 tablet 3   levothyroxine (SYNTHROID) 125 MCG  tablet Take 125 mcg by mouth daily before breakfast.     nitroGLYCERIN (NITROSTAT) 0.4 MG SL tablet Place 0.4 mg under the tongue every 5 (five) minutes x 3 doses as needed for chest pain.     pantoprazole (PROTONIX) 40 MG tablet Take 1 tablet (40 mg total) by mouth daily. Take 30-60 min before first meal of the day 30 tablet 2   predniSONE (DELTASONE) 1 MG tablet Take 2 mg by mouth daily.     predniSONE (DELTASONE) 5 MG tablet Take 5 mg by mouth daily.     rosuvastatin (CRESTOR) 5 MG tablet Take 1 tablet (5 mg total) by mouth 3 (three) times a week. 36 tablet 3   SYMBICORT 160-4.5 MCG/ACT inhaler Inhale 2 puffs into the lungs in the morning and at bedtime. 33 g 3   No current facility-administered medications for this visit.  Allergies  Allergen Reactions   Adenosine Anaphylaxis   Contrast Media [Iodinated Contrast Media] Anaphylaxis   Influenza Virus Vaccine Anaphylaxis   Nitrofurantoin Itching   Milk ([place])     Other reaction(s): Other Other Reaction: Other reaction   Breztri Aerosphere [Budeson-Glycopyrrol-Formoterol] Other (See Comments)    Urinary retention.   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  Tobacco Use   Smoking status: Never    Passive exposure: Past   Smokeless tobacco: Never  Vaping Use   Vaping Use: Never used  Substance and Sexual Activity   Alcohol use: Not Currently    Comment: rarely   Drug use: No   Sexual activity: Not Currently    Birth control/protection: Surgical    Comment: Hysterectomy  Other Topics Concern   Not on file  Social History Narrative   Not on file   Social Determinants of Health    Financial Resource Strain: Not on file  Food Insecurity: No Food Insecurity (03/14/2022)   Hunger Vital Sign    Worried About Running Out of Food in the Last Year: Never true    Ran Out of Food in the Last Year: Never true  Transportation Needs: No Transportation Needs (03/14/2022)   PRAPARE - Hydrologist (Medical): No    Lack of Transportation (Non-Medical): No  Physical Activity: Not on file  Stress: Not on file  Social Connections: Not on file  Intimate Partner Violence: Not At Risk (03/14/2022)   Humiliation, Afraid, Rape, and Kick questionnaire    Fear of Current or Ex-Partner: No    Emotionally Abused: No    Physically Abused: No    Sexually Abused: No    Family History  Problem Relation Age of Onset   Asthma Mother    Heart disease Mother    Asthma Sister    Heart disease Sister    Breast cancer Sister 56   Heart disease Father    Kidney disease Father    Prostate cancer Brother        dx. 28s   Heart attack Brother 26   Alzheimer's disease Brother 16   Breast cancer Maternal Aunt 70   Stomach cancer Maternal Grandfather 55   Asthma Sister    Memory loss Sister    Heart attack Son 50   Spinal muscular atrophy Grandchild        type II   Cancer Other 24       niece dx. ca of salivary gland    Breast cancer Other 46       niece; negative genetic testing 10 years ago   Melanoma Other    Alzheimer's disease Maternal Aunt    Cervical cancer Cousin        maternal 1st cousin dx. at 37   Breast cancer Cousin 60       maternal 1st cousin   Lung cancer Cousin 80       maternal 1st cousin; smoker   Heart disease Cousin    Leukemia Cousin 14       paternal  1st cousin   Breast cancer Other        paternal great grandmother (PGF's mother) dx. late 28s-early 31s   Coronary artery disease Other     Review of Systems:  As stated in the HPI and otherwise negative.   BP (!) 168/72   Pulse 97   Ht '5\' 4"'$  (1.626 m)   Wt 67.3 kg   SpO2 99%    BMI 25.47 kg/m   Physical Examination:  General: Well developed, well nourished, NAD  HEENT: OP clear, mucus membranes moist  SKIN: warm, dry. No rashes. Neuro: No focal deficits  Musculoskeletal: Muscle strength 5/5 all ext  Psychiatric: Mood and affect normal  Neck: No JVD, no carotid bruits, no thyromegaly, no lymphadenopathy.  Lungs:Clear bilaterally, no wheezes, rhonci, crackles Cardiovascular: Regular rate and rhythm. No murmurs, gallops or rubs. Abdomen:Soft. Bowel sounds present. Non-tender.  Extremities: No lower extremity edema. Pulses are 2 + in the bilateral DP/PT.  Echo December 2023: 1. Left ventricular ejection fraction, by estimation, is >75%. The left  ventricle has hyperdynamic function. The left ventricle has no regional  wall motion abnormalities. Left ventricular diastolic parameters are  consistent with Grade I diastolic  dysfunction (impaired relaxation).   2. Right ventricular systolic function is normal. The right ventricular  size is normal. Tricuspid regurgitation signal is inadequate for assessing  PA pressure.   3. The mitral valve is degenerative. No evidence of mitral valve  regurgitation. Mild mitral stenosis. The mean mitral valve gradient is 4.5  mmHg.   4. The aortic valve is calcified. Aortic valve regurgitation is not  visualized. Aortic valve sclerosis/calcification is present, without any  evidence of aortic stenosis.   5. The inferior vena cava is normal in size with greater than 50%  respiratory variability, suggesting right atrial pressure of 3 mmHg.   EKG:  EKG is not ordered today. The ekg ordered today demonstrates   Recent Labs: 03/10/2022: ALT 14; B Natriuretic Peptide 46.8; TSH 2.495 03/14/2022: Magnesium 2.2 04/08/2022: BUN 12; Creatinine, Ser 1.11; Hemoglobin 14.7; Platelets 192; Potassium 4.1; Sodium 139   Lipid Panel    Component Value Date/Time   CHOL 236 (H) 03/11/2022 1135   CHOL 174 07/26/2017 1001   TRIG 46  03/11/2022 1135   HDL 90 03/11/2022 1135   HDL 76 07/26/2017 1001   CHOLHDL 2.6 03/11/2022 1135   VLDL 9 03/11/2022 1135   LDLCALC 137 (H) 03/11/2022 1135   LDLCALC 85 07/26/2017 1001     Wt Readings from Last 3 Encounters:  04/13/22 67.3 kg  03/11/22 69.6 kg  01/09/22 69.7 kg    Assessment and Plan:   1. CAD with angina: She was recently admitted with acute respiratory illness secondary to RSV infection/COPD. Troponin up to 1063 while admitted last month. No worrisome chest pain at home. She does not wish to try Imdur. She is not on a beta blocker due to fatigue and hypotension.  Continue ASA. Resume statin.   2. HLD: LDL 137 in December 2023. She has not been taking her statin or Zetia. Will resume Zetia 10 mg daily and Crestor 5 mg three times per week. Repeat lipids and LFTS in 12 weeks.   Labs/ tests ordered today include:   Orders Placed This Encounter  Procedures   Lipid panel   Hepatic function panel   Disposition:   F/U with me in 12 months  Signed, [name], MD 04/13/2022 8:55 AM    [facility] [id number]  [address], [place], [place]  [address] Phone: [phone]; Fax: [phone]

## 2022-04-13 ENCOUNTER — Encounter: Payer: Self-pay | Admitting: Cardiovascular Disease

## 2022-04-13 ENCOUNTER — Ambulatory Visit: Payer: Medicare Other | Attending: Cardiovascular Disease | Admitting: Cardiovascular Disease

## 2022-04-13 VITALS — BP 168/72 | HR 97 | Ht 64.0 in | Wt 148.4 lb

## 2022-04-13 DIAGNOSIS — E7800 Pure hypercholesterolemia, unspecified: Secondary | ICD-10-CM | POA: Insufficient documentation

## 2022-04-13 DIAGNOSIS — E785 Hyperlipidemia, unspecified: Secondary | ICD-10-CM | POA: Insufficient documentation

## 2022-04-13 DIAGNOSIS — I25118 Atherosclerotic heart disease of native coronary artery with other forms of angina pectoris: Secondary | ICD-10-CM | POA: Diagnosis not present

## 2022-04-13 MED ORDER — ROSUVASTATIN CALCIUM 5 MG PO TABS: 5.0000 mg | ORAL_TABLET | ORAL | 3 refills | Status: AC

## 2022-04-13 MED ORDER — EZETIMIBE 10 MG PO TABS: 10.0000 mg | ORAL_TABLET | Freq: Every day | ORAL | 3 refills | Status: DC

## 2022-04-13 NOTE — Patient Instructions (Addendum)
Medication Instructions:  Your physician has recommended you make the following change in your medication:  1.) start ezetimibe (Zetia) 10 mg - one tablet daily 2.) start rosuvastatin (Crestor) 5 mg - one tablet three time a week  *If you need a refill on your cardiac medications before your next appointment, please call your pharmacy*   Lab Work: Please return in 12 weeks for blood work (lipids/liver)   Testing/Procedures: none   Follow-Up: At [facility], you and your health needs are our priority.  As part of our continuing mission to provide you with exceptional heart care, we have created designated Provider Care Teams.  These Care Teams include your primary Cardiologist (physician) and Advanced Practice Providers (APPs -  Physician Assistants and Nurse Practitioners) who all work together to provide you with the care you need, when you need it.  We recommend signing up for the patient portal called "MyChart".  Sign up information is provided on this After Visit Summary.  MyChart is used to connect with patients for Virtual Visits (Telemedicine).  Patients are able to view lab/test results, encounter notes, upcoming appointments, etc.  Non-urgent messages can be sent to your provider as well.   To learn more about what you can do with MyChart, go to [email or web address].    Your next appointment:   12 month(s)  Provider:   [name], MD

## 2022-04-14 NOTE — Progress Notes (Deleted)
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 Asthma flare Chief Complaint  Patient presents with   Acute Visit    Pt c/o cough, wheezing and increased SOB x 2 wks. She is coughing up clear sputum.   prev w/u showed bad reflux  maint at baseline symb 160 2 bid and no proair  02/24/16 called in 7 days of worse cough/ wheeze/sob> rx medrol not clear she took it Exp to gg child sick prior to onset of cough rec Take 4 for three days 3 for three days 2 for three days 1 for three days and stop  Start Pantoprazole (protonix) 40 mg   Take  30-60 min before first meal of the day and Pepcid (famotidine)  20 mg one @  bedtime until return to office - this is the best way to tell whether stomach acid is contributing to your problem.      07/31/2018 acute extended ov/Sonia [name] re:  Worse sob since   Nov 2018  Now on omeprazole '20mg'$  60 min ac  Early march 2020 worse sob despite symbicort 160 2 bid   No cough Sleeping on wedge  Room to room at home  rec Try omeprazole 20 mg x 2 x 30-60 min before your first meal Symbicort 160 Take 2 puffs first thing in am and then another 2 puffs about 12 hours later.  Only use your albuterol as a rescue medication  Please schedule a follow up visit in 3 months but call sooner if needed  with all medications /inhalers/ solutions in hand so we can verify exactly what you are taking. This includes all medications from all doctors and over the  counters   Dx April 2021  PMR >>>  placed on placed on prednisone   10/11/2020  f/u ov/Sonia [name] re: ACOS/ large HH back on breztri   prednisone 2-1-'2mg'$  alternating for pmr / changed symbicort to breztri and worse, better back on breztri  Chief Complaint  Patient presents with   Follow-up    Shortness of breath    Dyspnea:  can do ht early in am  Cough: assoc with pnds to point of gagging  /no recent abx Sleeping: bed is 10 degrees with bed blocks plus sev  pillows  SABA use: hfa twice daily / doest not have functioning neb as per AVS recs ("forgot to tell you it wasn't working/ I thought you knew that and had ordered it" - record indicates this was not the case at all - see phone note 09/14/20)  02: none Covid status:   never vax  Rec We need to be sure you have the nebulizer  Plan A = Automatic = Always=    breztri  (or symbicort 160) Take 2 puffs first thing in am and then another 2 puffs about 12 hours later.   Plan B = Backup (to supplement plan A, not to replace it) Only use your  albuterol inhaler as a rescue medication Plan C = Crisis (instead of Plan B but only if Plan B stops working) - only use your albuterol nebulizer if you first try Plan B and it fails to help    01/09/2022  f/u ov/Sonia [name] re: ACOS /HH   maint on symb 160 and prednisone 10 mg daily for PMR  Chief Complaint  Patient presents with   Follow-up    Using Albuterol everyday and 10 mg prednisone right now from PCP  Dyspnea:  MMRC2 = can't walk a nl pace on a flat grade s sob but does fine slow and flat  Cough: none  Sleeping: bed blocks no res cc  SABA use: no neb / still not clear on when to use saba 02: none  Covid status:   never vax/ infected x one  Rec Plan A = Automatic = Always=    Symbicort 160  Take 2 puffs first thing in am and then another 2 puffs about 12 hours later.  Work on inhaler technique:  Plan B = Backup (to supplement plan A, not to replace it) Only use your albuterol inhaler as a rescue  medication Plan C = Crisis (instead of Plan B but only if Plan B stops working) - only use your albuterol nebulizer if you first try [place] to try albuterol 15 min before an activity (on alternating days)  that you know would usually make you short of breath  Please schedule a follow up visit in 3 months but call sooner if needed  with all medications /inhalers/ solutions in hand   04/16/2022  f/u ov/Sonia [name] re: ***   maint on ***  No chief complaint on file.   Dyspnea:  *** Cough: *** Sleeping: *** SABA use: *** 02: *** Covid status:   *** Lung cancer screening :  ***    No obvious day to day or daytime variability or assoc excess/ purulent sputum or mucus plugs or hemoptysis or cp or chest tightness, subjective wheeze or overt sinus or hb symptoms.   *** without nocturnal  or early am exacerbation  of respiratory  c/o's or need for noct saba. Also denies any obvious fluctuation of symptoms with weather or environmental changes or other aggravating or alleviating factors except as outlined above   No unusual exposure hx or h/o childhood pna/ asthma or knowledge of premature birth.  Current Allergies, Complete Past Medical History, Past Surgical History, Family History, and Social History were reviewed in [facility] record.  ROS  The following are not active complaints unless bolded Hoarseness, sore throat, dysphagia, dental problems, itching, sneezing,  nasal congestion or discharge of excess mucus or purulent secretions, ear ache,   fever, chills, sweats, unintended wt loss or wt gain, classically pleuritic or exertional cp,  orthopnea pnd or arm/hand swelling  or leg swelling, presyncope, palpitations, abdominal pain, anorexia, nausea, vomiting, diarrhea  or change in bowel habits or change in bladder habits, change in stools or change in urine, dysuria, hematuria,  rash, arthralgias, visual complaints, headache, numbness, weakness or ataxia or problems  with walking or coordination,  change in mood or  memory.        No outpatient medications have been marked as taking for the 04/16/22 encounter (Appointment) with Tanda Rockers, MD.                       Physical Exam:  Wts  04/16/2022          ***  01/09/2022     153  10/03/2021       148   09/18/2021       148  10/11/2020         159   05/24/2020       161 01/05/2020     156  05/12/2019         152  02/03/2019     153 11/05/2018       152  07/31/2018       154   03/07/16 160 lb 12.8 oz (72.9 kg)  01/04/16 160 lb (72.6 kg)  04/26/15 159 lb 4.8 oz (72.3 kg)    Vital signs reviewed  04/16/2022  - Note at rest 02 sats  ***% on ***   General appearance:    ***      Mild bar***

## 2022-04-16 ENCOUNTER — Ambulatory Visit: Payer: Medicare Other | Admitting: Internal Medicine

## 2022-04-30 NOTE — Progress Notes (Unsigned)
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 Asthma flare Chief Complaint  Patient presents with   Acute Visit    Pt c/o cough, wheezing and increased SOB x 2 wks. She is coughing up clear sputum.   prev w/u showed bad reflux  maint at baseline symb 160 2 bid and no proair  02/24/16 called in 7 days of worse cough/ wheeze/sob> rx medrol not clear she took it Exp to gg child sick prior to onset of cough rec Take 4 for three days 3 for three days 2 for three days 1 for three days and stop  Start Pantoprazole (protonix) 40 mg   Take  30-60 min before first meal of the day and Pepcid (famotidine)  20 mg one @  bedtime until return to office - this is the best way to tell whether stomach acid is contributing to your problem.      07/31/2018 acute extended ov/Sonia [name] re:  Worse sob since   Nov 2018  Now on omeprazole 86m 60 min ac  Early march 2020 worse sob despite symbicort 160 2 bid   No cough Sleeping on wedge  Room to room at home  rec Try omeprazole 20 mg x 2 x 30-60 min before your first meal Symbicort 160 Take 2 puffs first thing in am and then another 2 puffs about 12 hours later.  Only use your albuterol as a rescue medication  Please schedule a follow up visit in 3 months but call sooner if needed  with all medications /inhalers/ solutions in hand so we can verify exactly what you are taking. This includes all medications from all doctors and over the  counters   Dx April 2021  PMR >>>  placed on placed on prednisone   10/11/2020  f/u ov/Sonia [name] re: ACOS/ large HH back on breztri   prednisone 2-1-285malternating for pmr / changed symbicort to breztri and worse, better back on breztri  Chief Complaint  Patient presents with   Follow-up    Shortness of breath    Dyspnea:  can do ht early in am  Cough: assoc with pnds to point of gagging  /no recent abx Sleeping: bed is 10 degrees with bed blocks plus sev  pillows  SABA use: hfa twice daily / doest not have functioning neb as per AVS recs ("forgot to tell you it wasn't working/ I thought you knew that and had ordered it" - record indicates this was not the case at all - see phone note 09/14/20)  02: none Covid status:   never vax  Rec We need to be sure you have the nebulizer  Plan A = Automatic = Always=    breztri  (or symbicort 160) Take 2 puffs first thing in am and then another 2 puffs about 12 hours later.   Plan B = Backup (to supplement plan A, not to replace it) Only use your albuterol  inhaler as a rescue medication Plan C = Crisis (instead of Plan B but only if Plan B stops working) - only use your albuterol nebulizer if you first try Plan B and it fails to help    01/09/2022  f/u ov/Sonia [name] re: ACOS /HH   maint on symb 160 and prednisone 10 mg daily for PMR  Chief Complaint  Patient presents with   Follow-up    Using Albuterol everyday and 10 mg prednisone right now from PCP  Dyspnea:  MMRC2 = can't walk a nl pace on a flat grade s sob but does fine slow and flat  Cough: none  Sleeping: bed blocks no res cc  SABA use: no neb / still not clear on when to use saba 02: none  Covid status:   never vax/ infected x one  Rec Plan A = Automatic = Always=    Symbicort 160  Take 2 puffs first thing in am and then another 2 puffs about 12 hours later.  Work on inhaler technique:  Plan B = Backup (to supplement plan A, not to replace it) Only use your albuterol inhaler as a rescue  medication Plan C = Crisis (instead of Plan B but only if Plan B stops working) - only use your albuterol nebulizer if you first try [place] to try albuterol 15 min before an activity (on alternating days)  that you know would usually make you short of breath  Please schedule a follow up visit in 3 months but call sooner if needed  with all medications /inhalers/ solutions in hand   04/16/2022  f/u ov/Sonia [name] re: Asthma  maint on symbicort 160 and pred 6 mg daily   RSV sx improved over last 10 days. SOB persistent.   Dyspnea:  [employer]  Cough: none  Sleeping: bed blocks/needs albuterol 4 am since d/c  SABA use: hfa  p exercise / no longer neb  02: none      No obvious day to day or daytime variability or assoc excess/ purulent sputum or mucus plugs or hemoptysis or cp or chest tightness, subjective wheeze or overt sinus or hb symptoms.   *** without nocturnal  or early am exacerbation  of respiratory  c/o's or need for noct saba. Also denies any obvious fluctuation of symptoms with weather or environmental changes or other aggravating or alleviating factors except as outlined above   No unusual exposure hx or h/o childhood pna/ asthma or knowledge of premature birth.  Current Allergies, Complete Past Medical History, Past Surgical History, Family History, and Social History were reviewed in [facility] record.  ROS  The following are not active complaints unless bolded Hoarseness, sore throat, dysphagia, dental problems, itching, sneezing,  nasal congestion or discharge of excess mucus or purulent secretions, ear ache,   fever, chills, sweats, unintended wt loss or wt gain, classically pleuritic or exertional cp,  orthopnea pnd or arm/hand swelling  or leg swelling, presyncope, palpitations, abdominal pain, anorexia, nausea, vomiting, diarrhea  or change in bowel habits or change in bladder habits, change in stools or change in urine, dysuria,  hematuria,  rash, arthralgias, visual complaints, headache, numbness, weakness or ataxia or problems with walking or coordination,  change in mood or  memory.        No outpatient medications have been marked as taking for the 04/16/22 encounter (Appointment) with Tanda Rockers, MD.  Physical Exam:  Wts  04/16/2022          ***  01/09/2022     153  10/03/2021       148   09/18/2021       148  10/11/2020         159   05/24/2020       161 01/05/2020     156  05/12/2019         152  02/03/2019     153 11/05/2018       152  07/31/2018       154   03/07/16 160 lb 12.8 oz (72.9 kg)  01/04/16 160 lb (72.6 kg)  04/26/15 159 lb 4.8 oz (72.3 kg)    Vital signs reviewed  04/16/2022  - Note at rest 02 sats  ***% on ***   General appearance:    ***      Mild bar***

## 2022-05-01 ENCOUNTER — Ambulatory Visit (INDEPENDENT_AMBULATORY_CARE_PROVIDER_SITE_OTHER): Payer: Medicare Other | Admitting: Internal Medicine

## 2022-05-01 ENCOUNTER — Encounter: Payer: Self-pay | Admitting: Internal Medicine

## 2022-05-01 VITALS — BP 104/60 | HR 111 | Temp 98.0°F | Ht 64.0 in | Wt 149.0 lb

## 2022-05-01 DIAGNOSIS — K219 Gastro-esophageal reflux disease without esophagitis: Secondary | ICD-10-CM | POA: Diagnosis not present

## 2022-05-01 DIAGNOSIS — E039 Hypothyroidism, unspecified: Secondary | ICD-10-CM | POA: Diagnosis not present

## 2022-05-01 DIAGNOSIS — I25118 Atherosclerotic heart disease of native coronary artery with other forms of angina pectoris: Secondary | ICD-10-CM

## 2022-05-01 DIAGNOSIS — J4540 Moderate persistent asthma, uncomplicated: Secondary | ICD-10-CM

## 2022-05-01 NOTE — Patient Instructions (Addendum)
Plan A = Automatic = Always=    Symbicort 160 (dulera 200) Take 2 puffs first thing in am and then another 2 puffs about 12 hours later.    Work on inhaler technique:  relax and gently blow all the way out then take a nice smooth full deep breath back in, triggering the inhaler at same time you start breathing in.  Hold breath in for at least  5 seconds if you can. Blow out symbicort  thru nose. Rinse and gargle with water when done.  If mouth or throat bother you at all,  try brushing teeth/gums/tongue with arm and hammer toothpaste/ make a slurry and gargle and spit out.  - practice with empty symbicort before the real medication      Plan B = Backup (to supplement plan A, not to replace it) Only use your albuterol inhaler as a rescue medication to be used if you can't catch your breath by resting or doing a relaxed purse lip breathing pattern.  - The less you use it, the better it will work when you need it. - Ok to use the inhaler up to 2 puffs  every 4 hours if you must but call for appointment if use goes up over your usual need - Don't leave home without it !!  (think of it like the spare tire for your car)   Plan C = Crisis (instead of Plan B but only if Plan B stops working) - only use your albuterol nebulizer if you first try Plan B and it fails to help > ok to use the nebulizer up to every 4 hours but if start needing it regularly call for immediate appointment    Also  Ok to try albuterol 15 min before an activity (on alternating days)  that you know would usually make you short of breath and see if it makes any difference and if makes none then don't take albuterol after activity unless you can't catch your breath as this means it's the resting that helps, not the albuterol.  You should consider alternatives to fosamax :  reclast or prolia   My office will be contacting you by phone for referral to [facility]   - if you don't hear back from my office within one week please  call us back or notify us thru MyChart and we'll address it right away.      Please schedule a follow up visit in 3 months but call sooner if needed  with albuterol and symbicort

## 2022-05-02 ENCOUNTER — Encounter: Payer: Self-pay | Admitting: Internal Medicine

## 2022-05-02 NOTE — Assessment & Plan Note (Signed)
Referred to [facility]  [facility] at her request - prefers [facility] as near her rheumatology office

## 2022-05-02 NOTE — Assessment & Plan Note (Signed)
eval at [facility] asthma center that gerd was contributing to asthma but declined surgery with large [place] present on cxr 02/03/2019  - advised against fosamax in this setting with options = reclast or prolia

## 2022-05-02 NOTE — Addendum Note (Signed)
Addended by: Christinia Gully B on: 05/02/2022 06:07 AM   Modules accepted: Level of Service

## 2022-05-02 NOTE — Assessment & Plan Note (Addendum)
Informed at [facility] asthma center severe gerd likely the cause with large [place] on cxr but "turned surgery down" PFT's  07/30/11   FEV1  1.25 (61 % ) ratio 0.47  p 6 % improvement from saba p ? prior to study with DLCO  94 % and  corrects to 141  % for alv volume c classic f/v loop for  chronic asthma    - FENO 03/07/2016  =   9 on symb 160 2bid during acute flare - 07/31/2018     75% > continue symb 160 2bid  - 11/05/2018  After extensive coaching inhaler device,  effectiveness =    75% (short ti)  - 11/05/18  [facility] one AT :   MM   Level 179  - 02/03/2019  After extensive coaching inhaler device,  effectiveness =    75% (short Ti) > rec trial of breztri 2bid > some better 05/12/2019 so continue - 01/05/2020  After extensive coaching inhaler device,  effectiveness =    75% > rechallenge with breztri > ? Urinary retention //uti  > d/c'd 05/24/2020 and restarted symb 160 2bid > better but then breathing worse so restarted breztri - 09/18/2021  After extensive coaching inhaler device,  effectiveness =    75% from a baseline of < 50% (short ti)  - 10/03/2021   continue symbicort 160 as bladder problems on breztri  - 01/09/2022  After extensive coaching inhaler device,  effectiveness =  80%    She is recovering from RSV but not yet back to baseline but no active wheeze today on exam  - The proper method of use, as well as anticipated side effects, of a metered-dose inhaler were discussed and demonstrated to the patient using teach back method.    Re SABA :  I spent extra time with pt today reviewing appropriate use of albuterol for prn use on exertion with the following points: 1) saba is for relief of sob that does not improve by walking a slower pace or resting but rather if the pt does not improve after trying this first. 2) If the pt is convinced, as many are, that saba helps recover from activity faster then it's easy to tell if this is the case by re-challenging : ie stop, take the inhaler, then p 5 minutes  try the exact same activity (intensity of workload) that just caused the symptoms and see if they are substantially diminished or not after saba 3) if there is an activity that reproducibly causes the symptoms, try the saba 15 min before the activity on alternate days   If in fact the saba really does help, then fine to continue to use it prn but advised may need to look closer at the maintenance regimen being used to achieve better control of airways disease with exertion.   F/u q 3 m, call sooner if needed         Each maintenance medication was reviewed in detail including emphasizing most importantly the difference between maintenance and prns and under what circumstances the prns are to be triggered using an action plan format where appropriate.  Total time for H and P, chart review, counseling, reviewing hfa  device(s) and generating customized AVS unique to this office visit / same day charting =30  min

## 2022-05-04 ENCOUNTER — Ambulatory Visit: Payer: Medicare Other | Admitting: Cardiovascular Disease

## 2022-05-09 DIAGNOSIS — M1991 Primary osteoarthritis, unspecified site: Secondary | ICD-10-CM | POA: Diagnosis not present

## 2022-05-09 DIAGNOSIS — Z6825 Body mass index (BMI) 25.0-25.9, adult: Secondary | ICD-10-CM | POA: Diagnosis not present

## 2022-05-09 DIAGNOSIS — M353 Polymyalgia rheumatica: Secondary | ICD-10-CM | POA: Diagnosis not present

## 2022-05-09 DIAGNOSIS — E663 Overweight: Secondary | ICD-10-CM | POA: Diagnosis not present

## 2022-05-09 DIAGNOSIS — M797 Fibromyalgia: Secondary | ICD-10-CM | POA: Diagnosis not present

## 2022-05-09 DIAGNOSIS — M316 Other giant cell arteritis: Secondary | ICD-10-CM | POA: Diagnosis not present

## 2022-05-09 DIAGNOSIS — Z7952 Long term (current) use of systemic steroids: Secondary | ICD-10-CM | POA: Diagnosis not present

## 2022-05-09 DIAGNOSIS — M1120 Other chondrocalcinosis, unspecified site: Secondary | ICD-10-CM | POA: Diagnosis not present

## 2022-05-20 ENCOUNTER — Emergency Department (HOSPITAL_BASED_OUTPATIENT_CLINIC_OR_DEPARTMENT_OTHER): Payer: Medicare Other

## 2022-05-20 ENCOUNTER — Emergency Department (HOSPITAL_BASED_OUTPATIENT_CLINIC_OR_DEPARTMENT_OTHER)
Admission: EM | Admit: 2022-05-20 | Discharge: 2022-05-21 | Disposition: A | Discharge status: Home / Self Care | Payer: Medicare Other | Attending: Emergency Medicine | Admitting: Emergency Medicine

## 2022-05-20 ENCOUNTER — Other Ambulatory Visit: Payer: Self-pay

## 2022-05-20 ENCOUNTER — Encounter (HOSPITAL_BASED_OUTPATIENT_CLINIC_OR_DEPARTMENT_OTHER): Payer: Self-pay | Admitting: Emergency Medicine

## 2022-05-20 DIAGNOSIS — R000 Tachycardia, unspecified: Secondary | ICD-10-CM | POA: Diagnosis not present

## 2022-05-20 DIAGNOSIS — R1011 Right upper quadrant pain: Secondary | ICD-10-CM | POA: Insufficient documentation

## 2022-05-20 DIAGNOSIS — J449 Chronic obstructive pulmonary disease, unspecified: Secondary | ICD-10-CM | POA: Insufficient documentation

## 2022-05-20 DIAGNOSIS — J45909 Unspecified asthma, uncomplicated: Secondary | ICD-10-CM | POA: Insufficient documentation

## 2022-05-20 DIAGNOSIS — E039 Hypothyroidism, unspecified: Secondary | ICD-10-CM | POA: Diagnosis not present

## 2022-05-20 DIAGNOSIS — Z91040 Latex allergy status: Secondary | ICD-10-CM | POA: Insufficient documentation

## 2022-05-20 DIAGNOSIS — R1084 Generalized abdominal pain: Secondary | ICD-10-CM

## 2022-05-20 DIAGNOSIS — Z7982 Long term (current) use of aspirin: Secondary | ICD-10-CM | POA: Insufficient documentation

## 2022-05-20 DIAGNOSIS — R7309 Other abnormal glucose: Secondary | ICD-10-CM | POA: Insufficient documentation

## 2022-05-20 DIAGNOSIS — R112 Nausea with vomiting, unspecified: Secondary | ICD-10-CM | POA: Diagnosis not present

## 2022-05-20 LAB — CBC
HCT: 45.3 % (ref 36.0–46.0)
Hemoglobin: 15 g/dL (ref 12.0–15.0)
MCH: 30.7 pg (ref 26.0–34.0)
MCHC: 33.1 g/dL (ref 30.0–36.0)
MCV: 92.8 fL (ref 80.0–100.0)
Platelets: 186 10*3/uL (ref 150–400)
RBC: 4.88 MIL/uL (ref 3.87–5.11)
RDW: 14.4 % (ref 11.5–15.5)
WBC: 9.7 10*3/uL (ref 4.0–10.5)
nRBC: 0 % (ref 0.0–0.2)

## 2022-05-20 LAB — COMPREHENSIVE METABOLIC PANEL
ALT: 9 U/L (ref 0–44)
AST: 21 U/L (ref 15–41)
Albumin: 3.8 g/dL (ref 3.5–5.0)
Alkaline Phosphatase: 49 U/L (ref 38–126)
Anion gap: 12 (ref 5–15)
BUN: 21 mg/dL (ref 8–23)
CO2: 24 mmol/L (ref 22–32)
Calcium: 9.8 mg/dL (ref 8.9–10.3)
Chloride: 105 mmol/L (ref 98–111)
Creatinine, Ser: 1.09 mg/dL — ABNORMAL HIGH (ref 0.44–1.00)
GFR, Estimated: 50 mL/min — ABNORMAL LOW (ref >60)
Glucose, Bld: 172 mg/dL — ABNORMAL HIGH (ref 70–99)
Potassium: 4.2 mmol/L (ref 3.5–5.1)
Sodium: 141 mmol/L (ref 135–145)
Total Bilirubin: 0.4 mg/dL (ref 0.3–1.2)
Total Protein: 7 g/dL (ref 6.5–8.1)

## 2022-05-20 LAB — TROPONIN I (HIGH SENSITIVITY): Troponin I (High Sensitivity): 6 ng/L (ref <18)

## 2022-05-20 LAB — LIPASE, BLOOD: Lipase: 27 U/L (ref 11–51)

## 2022-05-20 MED ORDER — DICYCLOMINE HCL 10 MG PO CAPS
10.0000 mg | ORAL_CAPSULE | Freq: Once | ORAL | Status: AC
  Administered 2022-05-20: 10 mg via ORAL
  Filled 2022-05-20: qty 1

## 2022-05-20 NOTE — ED Triage Notes (Signed)
Pt in with pain to central abdomen that began after she ate some soup around 7pm. States she has a hx of hiatal hernia, but this feels different. Pt states she keeps regurgitating saliva, but no vomit or diarrhea. States the pain radiates to midback. Hunched over in wheelchair throughout triage

## 2022-05-20 NOTE — ED Provider Notes (Cosign Needed)
[facility] Provider Note   CSN: [id number] Arrival date & time: 05/20/22  2137     History  Chief Complaint  Patient presents with   Abdominal Pain    Sonia [name] is a 82 y.o. female with medical history of hiatal hernia, NSTEMI, hypothyroid, GERD, COPD, asthma, GI bleed.  Patient presents to ED for evaluation of abdominal pain.  Patient reports that earlier this afternoon she was eating soup that her son had delivered to her.  Patient reports that after finishing soup she developed abdominal pain that was nonfocal in nature however over time has located to her right upper quadrant.  Patient states that she did not began excessively salivating her mouth.  Patient goes on to state that she became nauseous however is unsure if she threw up.  The patient goes on to state that she was spitting up a lot of fluid however will not state that she was throwing up.  The patient denies any chest pain, shortness of breath, fevers, diarrhea.  Patient reports he took Tums which did not relieve her pain.  Patient states that currently on examination her abdominal pain has decreased especially when lying on her right side.  Patient reports that she received a scan 2 months ago which showed some issues with her gallbladder.   Abdominal Pain Associated symptoms: nausea and vomiting        Home Medications Prior to Admission medications   Medication Sig Start Date End Date Taking? Authorizing Provider  albuterol (PROVENTIL) (2.5 MG/3ML) 0.083% nebulizer solution Take 3 mLs (2.5 mg total) by nebulization every 6 (six) hours as needed for wheezing or shortness of breath. Patient taking differently: Take 2.5 mg by nebulization every 6 (six) hours as needed for wheezing or shortness of breath. Use 1 nebulizer (2.5 mg) (scheduled) in the morning & may use every 6 hours as needed for wheezing/shortness of breath. 05/29/21   Tanda Rockers, MD  albuterol  (VENTOLIN HFA) 108 (90 Base) MCG/ACT inhaler Inhale 2 puffs into the lungs every 6 (six) hours as needed for wheezing or shortness of breath. May inhale 2 puffs every 4 hours if needed 03/16/22   Mercy Riding, MD  alendronate (FOSAMAX) 70 MG tablet Take 70 mg by mouth once a week.    [provider]  aspirin EC 81 MG tablet Take 1 tablet (81 mg total) by mouth daily. Swallow whole. 03/17/22   Mercy Riding, MD  calcium carbonate (TUMS - DOSED IN MG ELEMENTAL CALCIUM) 500 MG chewable tablet Chew 500 mg by mouth daily as needed for indigestion or heartburn.    [provider]  Cholecalciferol 25 MCG (1000 UT) capsule Take 1,500 Units by mouth 3 (three) times a week.    [provider]  Cyanocobalamin (B-12) 5000 MCG CAPS Place 5,000 mcg under the tongue 3 (three) times a week.    [provider]  ezetimibe (ZETIA) 10 MG tablet Take 1 tablet (10 mg total) by mouth daily. 04/13/22   Burnell Blanks, MD  levothyroxine (SYNTHROID) 125 MCG tablet Take 125 mcg by mouth daily before breakfast.    [provider]  nitroGLYCERIN (NITROSTAT) 0.4 MG SL tablet Place 0.4 mg under the tongue every 5 (five) minutes x 3 doses as needed for chest pain.    [provider]  pantoprazole (PROTONIX) 40 MG tablet Take 1 tablet (40 mg total) by mouth daily. Take 30-60 min before first meal of the  day 03/16/22   Mercy Riding, MD  predniSONE (DELTASONE) 1 MG tablet Take 1 mg by mouth daily.    [provider]  predniSONE (DELTASONE) 5 MG tablet Take 5 mg by mouth daily.    [provider]  rosuvastatin (CRESTOR) 5 MG tablet Take 1 tablet (5 mg total) by mouth 3 (three) times a week. 04/13/22   Burnell Blanks, MD  SYMBICORT 160-4.5 MCG/ACT inhaler Inhale 2 puffs into the lungs in the morning and at bedtime. 12/22/21   Tanda Rockers, MD      Allergies    Adenosine, Contrast media [iodinated contrast media], Influenza virus vaccine,  Nitrofurantoin, Milk (cow), Breztri aerosphere [budeson-glycopyrrol-formoterol], Codeine sulfate, and Latex    Review of Systems   Review of Systems  Gastrointestinal:  Positive for abdominal pain, nausea and vomiting.  All other systems reviewed and are negative.   Physical Exam Updated Vital Signs BP (!) 161/59   Pulse (!) 101   Temp (!) 97.3 F (36.3 C)   Resp 18   Wt 67.6 kg   SpO2 97%   BMI 25.58 kg/m  Physical Exam Vitals and nursing note reviewed.  Constitutional:      General: She is not in acute distress.    Appearance: Normal appearance. She is not ill-appearing, toxic-appearing or diaphoretic.  HENT:     Head: Normocephalic and atraumatic.     Nose: Nose normal.     Mouth/Throat:     Mouth: Mucous membranes are moist.     Pharynx: Oropharynx is clear.  Eyes:     Extraocular Movements: Extraocular movements intact.     Conjunctiva/sclera: Conjunctivae normal.     Pupils: Pupils are equal, round, and reactive to light.  Cardiovascular:     Rate and Rhythm: Regular rhythm. Tachycardia present.  Pulmonary:     Effort: Pulmonary effort is normal.     Breath sounds: Normal breath sounds. No wheezing.  Abdominal:     General: Abdomen is flat. Bowel sounds are normal.     Palpations: Abdomen is soft.     Tenderness: There is abdominal tenderness.     Comments: Nonfocal abdominal tenderness, abdomen soft and compressible, non peritoneal   Musculoskeletal:     Cervical back: Normal range of motion and neck supple. No tenderness.  Skin:    General: Skin is warm and dry.     Capillary Refill: Capillary refill takes less than 2 seconds.  Neurological:     Mental Status: She is alert and oriented to person, place, and time.     ED Results / Procedures / Treatments   Labs (all labs ordered are listed, but only abnormal results are displayed) Labs Reviewed  COMPREHENSIVE METABOLIC PANEL - Abnormal; Notable for the following components:      Result Value    Glucose, Bld 172 (*)    Creatinine, Ser 1.09 (*)    GFR, Estimated 50 (*)    All other components within normal limits  LIPASE, BLOOD  CBC  URINALYSIS, ROUTINE W REFLEX MICROSCOPIC  TROPONIN I (HIGH SENSITIVITY)  TROPONIN I (HIGH SENSITIVITY)    EKG EKG Interpretation  Date/Time:  Sunday May 20 2022 21:55:47 EDT Ventricular Rate:  103 PR Interval:  124 QRS Duration: 72 QT Interval:  348 QTC Calculation: 455 R Axis:   95 Text Interpretation: Sinus tachycardia Possible Left atrial enlargement Rightward axis Septal infarct , age undetermined No significant change since last tracing When compared with ECG of 08-Apr-2022 01:28, Septal  infarct is now Present Confirmed by Blanchie Dessert [phone]) on 05/20/2022 10:11:09 PM  Radiology No results found.  Procedures Procedures   Medications Ordered in ED Medications  dicyclomine (BENTYL) capsule 10 mg (10 mg Oral Given 05/20/22 2255)    ED Course/ Medical Decision Making/ A&P  Medical Decision Making Amount and/or Complexity of Data Reviewed Labs: ordered.  Risk Prescription drug management.   82 year old female presents to ED for evaluation.  Please see HPI for further details.  On examination patient is afebrile and nontachycardic.  Patient lung sounds clear bilaterally, she is not hypoxic.  Abdomen is soft and compressible throughout, nonperitoneal, however patient does endorse tenderness palpation that is nonfocal in nature.  Patient has negative Murphy sign, negative Rovsing's.  Patient neurological examination shows no focal neurodeficits.  Patient has no peripheral edema.  Patient CBC unremarkable, white blood cell count pending, no anemia.  Lipase unremarkable.  CMP with baseline creatinine, elevated glucose to 172.  Urinalysis pending.  Delta troponin pending at this time.  Patient provided 10 mg Bentyl for abdominal pain.  Patient reports feeling relief of abdominal pain at this time.  I attempted to CT scan  patient's abdomen and pelvis to assess for underlying gallbladder inflammation or issues.  The patient reports he has a history of gallbladder inflammation, was told about this 2 months ago by her PCP after CT scan.  We do not have ultrasound here at this time.  The patient refused the CT scan, stating that it is "too soon" for another CT scan.  I advised the patient that I was hoping to look for any inflammation of her gallbladder or other intra-abdominal process that could be accounting for her pain however she again refused the CT scan.  I advised patient that this could worsen her condition if we do not understand or know what is causing her abdominal pain and she went on to continue to deny the CT scan.  The patient is alert and oriented x 3.    At end of shift, patient workup not complete.  Patient will be signed out to oncoming provider Dr. Karle Starch pending delta troponin.   Final Clinical Impression(s) / ED Diagnoses Final diagnoses:  Generalized abdominal pain    Rx / DC Orders ED Discharge Orders     None         Azucena Cecil, PA-C 05/20/22 2340

## 2022-05-21 LAB — TROPONIN I (HIGH SENSITIVITY): Troponin I (High Sensitivity): 9 ng/L (ref <18)

## 2022-05-21 NOTE — ED Provider Notes (Signed)
Care of the patient assumed at the change of shift. Recent admission for NSTEMI, here now with abdominal pain. Refused CT scan. Delta Trop pending at the change of shift which remains negative. Discharged with PCP follow up. RTED for any other concerns   Truddie Hidden, MD 05/21/22 0040

## 2022-05-28 DIAGNOSIS — E038 Other specified hypothyroidism: Secondary | ICD-10-CM | POA: Diagnosis not present

## 2022-05-28 DIAGNOSIS — R109 Unspecified abdominal pain: Secondary | ICD-10-CM | POA: Diagnosis not present

## 2022-05-28 DIAGNOSIS — I25118 Atherosclerotic heart disease of native coronary artery with other forms of angina pectoris: Secondary | ICD-10-CM | POA: Diagnosis not present

## 2022-05-28 DIAGNOSIS — E119 Type 2 diabetes mellitus without complications: Secondary | ICD-10-CM | POA: Diagnosis not present

## 2022-05-28 DIAGNOSIS — K449 Diaphragmatic hernia without obstruction or gangrene: Secondary | ICD-10-CM | POA: Diagnosis not present

## 2022-06-19 ENCOUNTER — Ambulatory Visit (HOSPITAL_BASED_OUTPATIENT_CLINIC_OR_DEPARTMENT_OTHER): Payer: Medicare Other | Admitting: Physical Therapy

## 2022-06-21 ENCOUNTER — Encounter (HOSPITAL_BASED_OUTPATIENT_CLINIC_OR_DEPARTMENT_OTHER): Payer: Self-pay | Admitting: Physical Therapy

## 2022-06-21 ENCOUNTER — Ambulatory Visit (HOSPITAL_BASED_OUTPATIENT_CLINIC_OR_DEPARTMENT_OTHER): Payer: Medicare Other | Attending: Family Medicine | Admitting: Physical Therapy

## 2022-06-21 ENCOUNTER — Other Ambulatory Visit: Payer: Self-pay

## 2022-06-21 DIAGNOSIS — R262 Difficulty in walking, not elsewhere classified: Secondary | ICD-10-CM | POA: Diagnosis not present

## 2022-06-21 DIAGNOSIS — M6281 Muscle weakness (generalized): Secondary | ICD-10-CM | POA: Diagnosis not present

## 2022-06-21 DIAGNOSIS — M25512 Pain in left shoulder: Secondary | ICD-10-CM | POA: Diagnosis not present

## 2022-06-21 DIAGNOSIS — R293 Abnormal posture: Secondary | ICD-10-CM | POA: Diagnosis not present

## 2022-06-21 NOTE — Therapy (Deleted)
OUTPATIENT PHYSICAL THERAPY LOWER EXTREMITY EVALUATION   Patient Name: Sonia [name] MRN: [id number] DOB:May 26, 1940, 82 y.o., female Today's Date: 06/21/2022  END OF SESSION:   Past Medical History:  Diagnosis Date   Arthritis    Asthma    daily and prn inhalers; states good control currently   Breast cancer (HCC)    right   CAD S/P percutaneous coronary angioplasty 07/15/2016   COPD (chronic obstructive pulmonary disease) (HCC)    Dyspnea    occ with exertion   Fibromyalgia    GERD (gastroesophageal reflux disease)    no current med.   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   Vitamin [name] deficiency    Past Surgical History:  Procedure Laterality Date   ABDOMINAL HYSTERECTOMY     partial   APPENDECTOMY     BREAST BIOPSY Left    BREAST CYST EXCISION Left    BREAST EXCISIONAL BIOPSY Left 03/2015   BREAST LUMPECTOMY Right 08/2018   BREAST LUMPECTOMY WITH RADIOACTIVE SEED LOCALIZATION Left 04/07/2015   Procedure: BREAST LUMPECTOMY WITH RADIOACTIVE SEED LOCALIZATION;  Surgeon: Sonia Bouillonhomas Cornett, MD;  Location: [facility];  Service: General;  Laterality: Left;   BREAST LUMPECTOMY WITH RADIOACTIVE SEED LOCALIZATION Right 08/13/2018   Procedure: RIGHT BREAST LUMPECTOMY WITH RADIOACTIVE SEED LOCALIZATION;  Surgeon: Sonia LoronWakefield, Matthew, MD;  Location:  SURGERY CENTER;  Service: General;  Laterality: Right;   COLONOSCOPY     CORONARY STENT INTERVENTION N/A 07/15/2016   Procedure: Coronary Stent Intervention;  Surgeon: Sonia Bollmanooper, Michael, MD;  [facility];  Service: Cardiovascular;  Laterality: N/A;    ESOPHAGOGASTRODUODENOSCOPY (EGD) WITH PROPOFOL Left 07/25/2016   Procedure: ESOPHAGOGASTRODUODENOSCOPY (EGD) WITH PROPOFOL;  Surgeon: Sonia SalenKarki, Arya, MD;  [facility];  Service: Gastroenterology;  Laterality: Left;   KNEE ARTHROSCOPY Right 12/07/2003   LAPAROSCOPIC SALPINGO OOPHERECTOMY Right 09/08/2003   LEFT HEART CATH N/A 07/15/2016   Procedure: Left Heart Cath;  Surgeon: Sonia Bollmanooper, Michael, MD;  [facility];  Service: Cardiovascular;  Laterality: N/A;   LEFT HEART CATH AND CORONARY ANGIOGRAPHY N/A 04/14/2019   Procedure: LEFT HEART CATH AND CORONARY ANGIOGRAPHY;  Surgeon: Sonia HazelMcAlhany, Christopher D, MD;  [facility];  Service: Cardiovascular;  Laterality: N/A;   LYSIS OF ADHESION  09/08/2003   NASAL SINUS SURGERY  age 89   RECTAL POLYPECTOMY  10/02/2006   Patient Active Problem List   Diagnosis Date Noted   COPD with acute exacerbation 03/11/2022   Non-STEMI (non-ST elevated myocardial infarction) 03/11/2022   RSV (respiratory syncytial virus pneumonia) 03/11/2022   Acute bronchiolitis due to respiratory syncytial virus (RSV) 03/11/2022   Coronary artery disease of native artery of native heart with stable angina pectoris    Asthma, chronic, moderate persistent, uncomplicated 02/03/2019   DOE (dyspnea on exertion) 07/31/2018   Osteopenia 06/04/2018   Carcinoma of upper-outer quadrant of right breast in female, estrogen receptor positive 05/30/2018   GI bleed 07/25/2016   Melena 07/24/2016   Acute renal insufficiency 07/24/2016   CAD S/P percutaneous coronary angioplasty 07/16/2016   NSTEMI (non-ST elevated myocardial infarction)  07/13/2016   Genetic testing 06/09/2015   Family history of breast cancer in female 05/10/2015   History of melanoma 05/10/2015   Atypical lobular hyperplasia of left breast 04/26/2015   Vision problem 01/08/2014   Hiatus hernia syndrome 08/01/2012   Palpitations 10/18/2010   Acute chest pain 10/18/2010    Hypothyroidism 10/18/2010   Dyslipidemia 06/07/2009   Upper airway cough syndrome 03/29/2009   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  Rationale for Evaluation and Treatment: Rehabilitation  ONSET DATE: couple of months   SUBJECTIVE:   SUBJECTIVE STATEMENT: left shoulder  PERTINENT HISTORY: *** PAIN:  Are you having pain? Yes: NPRS scale: ***/10 Pain location: *** Pain description: *** Aggravating factors: *** Relieving factors: ***  PRECAUTIONS: {Therapy precautions:24002}  WEIGHT BEARING RESTRICTIONS: No  FALLS:  Has patient fallen in last 6 months? No  LIVING ENVIRONMENT:   OCCUPATION: retired  PLOF: Independent  PATIENT GOALS: be able to read without pain  NEXT MD VISIT: ***  OBJECTIVE:   DIAGNOSTIC FINDINGS: ***  PATIENT SURVEYS:  {rehab surveys:24030}  COGNITION: Overall cognitive status: Within functional limits for tasks assessed     SENSATION: WFL  EDEMA:    MUSCLE LENGTH:   POSTURE: {posture:25561}  PALPATION: ***  UPPER EXTREMITY MMT:  MMT Right eval Left eval  Shoulder flexion    Shoulder extension    Shoulder abduction    Shoulder adduction    Shoulder extension    Shoulder internal rotation    Shoulder external rotation    Middle trapezius    Lower trapezius    Elbow flexion    Elbow extension    Wrist flexion    Wrist extension    Wrist ulnar deviation    Wrist radial deviation    Wrist pronation    Wrist supination    Grip strength     (Blank rows = not tested)   UPPER EXTREMITY ROM:  {AROM/PROM:27142} ROM Right eval Left eval  Shoulder flexion    Shoulder extension    Shoulder abduction    Shoulder adduction    Shoulder extension    Shoulder internal rotation    Shoulder external rotation    Elbow flexion    Elbow extension    Wrist flexion    Wrist extension    Wrist ulnar  deviation    Wrist radial deviation    Wrist pronation    Wrist supination     (Blank rows = not tested)   Left flexion 115 Left abdcution 65 LOWER EXTREMITY SPECIAL TESTS:  {LEspecialtests:26242}  FUNCTIONAL TESTS:  {Functional tests:24029}  GAIT:   TODAY'S TREATMENT:                                                                                                                              DATE: ***  PATIENT EDUCATION:  Education details: HEP, POC, symptom management  Person educated: Patient Education method: Explanation, Demonstration, Tactile cues, Verbal cues, and Handouts Education comprehension: verbalized understanding, returned demonstration, verbal cues required, tactile cues required, and needs further education  HOME EXERCISE PROGRAM: ***  ASSESSMENT:  CLINICAL IMPRESSION: Patient is a 82 y.o. female who was seen today for physical therapy evaluation and treatment for left shoulder pain.   OBJECTIVE IMPAIRMENTS: {opptimpairments:25111}.   ACTIVITY LIMITATIONS: {activitylimitations:27494}  PARTICIPATION LIMITATIONS: {participationrestrictions:25113}  PERSONAL FACTORS: {Personal factors:25162} are also affecting patient's functional outcome.   REHAB POTENTIAL: {rehabpotential:25112}  CLINICAL DECISION MAKING: {clinical decision making:25114}  EVALUATION COMPLEXITY: {Evaluation complexity:25115}   GOALS: Goals reviewed with patient? Yes  SHORT TERM GOALS: Target date: *** *** Baseline: Goal status: {GOALSTATUS:25110}  2.  *** Baseline:  Goal status: {GOALSTATUS:25110}  3.  *** Baseline:  Goal status: {GOALSTATUS:25110}  4.  *** Baseline:  Goal status: {GOALSTATUS:25110}  5.  *** Baseline:  Goal status: {GOALSTATUS:25110}  6.  *** Baseline:  Goal status: {GOALSTATUS:25110}  LONG TERM GOALS: Target date: ***  *** Baseline:  Goal status: {GOALSTATUS:25110}  2.  *** Baseline:  Goal status: {GOALSTATUS:25110}  3.   *** Baseline:  Goal status: {GOALSTATUS:25110}  4.  *** Baseline:  Goal status: {GOALSTATUS:25110}  5.  *** Baseline:  Goal status: {GOALSTATUS:25110}  6.  *** Baseline:  Goal status: {GOALSTATUS:25110}   PLAN:  PT FREQUENCY: {rehab frequency:25116}  PT DURATION: {rehab duration:25117}  PLANNED INTERVENTIONS: {rehab planned interventions:25118::"Therapeutic exercises","Therapeutic activity","Neuromuscular re-education","Balance training","Gait training","Patient/Family education","Self Care","Joint mobilization"}  PLAN FOR NEXT SESSION: ***   Sonia [name], PT 06/21/2022, 11:52 AM

## 2022-06-21 NOTE — Therapy (Signed)
OUTPATIENT PHYSICAL THERAPY LOWER EXTREMITY EVALUATION   Patient Name: Sonia [name] MRN: [id number] DOB:1940-12-20, 82 y.o., female Today's Date: 06/21/2022  END OF SESSION:  PT End of Session - 06/21/22 2045     Visit Number 1    Number of Visits 12    Date for PT Re-Evaluation 08/02/22    PT Start Time 1145    PT Stop Time 1228    PT Time Calculation (min) 43 min    Activity Tolerance Patient tolerated treatment well    Behavior During Therapy [facility] for tasks assessed/performed             Past Medical History:  Diagnosis Date   Arthritis    Asthma    daily and prn inhalers; states good control currently   Breast cancer    right   CAD S/P percutaneous coronary angioplasty 07/15/2016   COPD (chronic obstructive pulmonary disease)    Dyspnea    occ with exertion   Fibromyalgia    GERD (gastroesophageal reflux disease)    no current med.   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/31/2021   Neck Posterior   Myocardial infarction    Pneumonia    Sclerosing adenosis of left breast 03/2015   Vitamin [name] deficiency    Past Surgical History:  Procedure Laterality Date   ABDOMINAL HYSTERECTOMY     partial   APPENDECTOMY     BREAST BIOPSY Left    BREAST CYST EXCISION Left    BREAST EXCISIONAL BIOPSY Left 03/2015   BREAST LUMPECTOMY Right 08/2018   BREAST LUMPECTOMY WITH RADIOACTIVE SEED LOCALIZATION Left 04/07/2015   Procedure: BREAST LUMPECTOMY WITH RADIOACTIVE SEED LOCALIZATION;  Surgeon: Sonia Bouillonhomas Cornett, MD;  Location: [facility];  Service: General;  Laterality: Left;   BREAST LUMPECTOMY WITH RADIOACTIVE SEED LOCALIZATION Right 08/13/2018   Procedure: RIGHT BREAST LUMPECTOMY WITH RADIOACTIVE SEED LOCALIZATION;  Surgeon: Sonia LoronWakefield,  Matthew, MD;  Location: [facility];  Service: General;  Laterality: Right;   COLONOSCOPY     CORONARY STENT INTERVENTION N/A 07/15/2016   Procedure: Coronary Stent Intervention;  Surgeon: Sonia Bollmanooper, Michael, MD;  [facility];  Service: Cardiovascular;  Laterality: N/A;   ESOPHAGOGASTRODUODENOSCOPY (EGD) WITH PROPOFOL Left 07/25/2016   Procedure: ESOPHAGOGASTRODUODENOSCOPY (EGD) WITH PROPOFOL;  Surgeon: Sonia SalenKarki, Arya, MD;  [facility];  Service: Gastroenterology;  Laterality: Left;   KNEE ARTHROSCOPY Right 12/07/2003   LAPAROSCOPIC SALPINGO OOPHERECTOMY Right 09/08/2003   LEFT HEART CATH N/A 07/15/2016   Procedure: Left Heart Cath;  Surgeon: Sonia Bollmanooper, Michael, MD;  [facility];  Service: Cardiovascular;  Laterality: N/A;   LEFT HEART CATH AND CORONARY ANGIOGRAPHY N/A 04/14/2019   Procedure: LEFT HEART CATH AND CORONARY ANGIOGRAPHY;  Surgeon: Sonia HazelMcAlhany, Christopher D, MD;  [facility];  Service: Cardiovascular;  Laterality: N/A;   LYSIS OF ADHESION  09/08/2003   NASAL SINUS SURGERY  age 82   RECTAL POLYPECTOMY  10/02/2006   Patient Active Problem List   Diagnosis Date Noted   COPD with acute exacerbation 03/11/2022   Non-STEMI (non-ST elevated myocardial infarction) 03/11/2022   RSV (respiratory syncytial virus pneumonia) 03/11/2022   Acute bronchiolitis due to respiratory syncytial virus (RSV) 03/11/2022   Coronary  artery disease of native artery of native heart with stable angina pectoris    Asthma, chronic, moderate persistent, uncomplicated 02/03/2019   DOE (dyspnea on exertion) 07/31/2018   Osteopenia 06/04/2018   Carcinoma of upper-outer quadrant of right breast in female, estrogen receptor positive 05/30/2018   GI bleed 07/25/2016   Melena 07/24/2016   Acute renal insufficiency 07/24/2016   CAD S/P percutaneous coronary angioplasty 07/16/2016   NSTEMI (non-ST elevated myocardial infarction) 07/13/2016   Genetic  testing 06/09/2015   Family history of breast cancer in female 05/10/2015   History of melanoma 05/10/2015   Atypical lobular hyperplasia of left breast 04/26/2015   Vision problem 01/08/2014   Hiatus hernia syndrome 08/01/2012   Palpitations 10/18/2010   Acute chest pain 10/18/2010   Hypothyroidism 10/18/2010   Dyslipidemia 06/07/2009   Upper airway cough syndrome 03/29/2009   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 Patient was moving a mattress several months ago when she had a pull in her shoulder. She has had  pain since that time. Her pain has resolved somewhat. She feels it most when she reaches back. She reports no significant history of left shoulder pain but she also reports seeing an orthopedic over a year ago. At that time she was she reports she was found to have what sounds like significant degeneration in her shoulder.  PERTINENT HISTORY: Polymyalgia, arthritis,  PAIN:  Patient reports no pain, but reports that her Left lateral deltoids feels crunchy.  PRECAUTIONS: None  WEIGHT BEARING RESTRICTIONS: No  FALLS:  Has patient fallen in last 6 months? No  LIVING ENVIRONMENT:   OCCUPATION: retired  PLOF: Independent  PATIENT GOALS: be able to read without pain  NEXT MD VISIT: 4/26  OBJECTIVE:   DIAGNOSTIC FINDINGS: per patient right shoulder OA   PATIENT SURVEYS:  FOTO    COGNITION: Overall cognitive status: Within functional limits for tasks assessed     SENSATION: WFL  EDEMA:    MUSCLE LENGTH:   POSTURE: No Significant  postural limitations  PALPATION: Tightness in L lateral deltoid   Left shoulder flexion: 115 degrees Left Shoulder abduction 65 degrees   LOWER EXTREMITY MMT:  MMT Right eval Left eval  Hip flexion    Hip extension    Hip abduction    Hip adduction    Hip internal rotation    Hip external rotation    Knee flexion    Knee extension    Ankle dorsiflexion    Ankle plantarflexion    Ankle inversion    Ankle eversion     (Blank rows = not tested)  LOWER EXTREMITY SPECIAL TESTS:    FUNCTIONAL TESTS:    GAIT:   TODAY'S TREATMENT:  DATE:  4/11   Supine Shoulder Press AAROM in Abduction with Dowel Shoulder extension with resistance  Scapular Retraction with Resistance    PATIENT EDUCATION:  Education details: HEP, POC, symptom management  Person educated: Patient Education method: Explanation, Demonstration, Tactile cues, Verbal cues, and Handouts Education comprehension: verbalized understanding, returned demonstration, verbal cues required, tactile cues required, and needs further education  HOME EXERCISE PROGRAM: Access Code: [facility] URL: [email or web address] Date: 06/21/2022 Prepared by: Lorayne Bender  Exercises - Supine Shoulder Press AAROM in Abduction with Dowel  - 1 x daily - 7 x weekly - 3 sets - 10 reps - Shoulder extension with resistance - Neutral  - 1 x daily - 7 x weekly - 3 sets - 10 reps - Scapular Retraction with Resistance  - 1 x daily - 7 x weekly - 3 sets - 10 reps  ASSESSMENT:  CLINICAL IMPRESSION: Patient is a 82 y.o. female who was seen today for physical therapy evaluation and treatment for left shoulder pain. Patient presents with tightness on her Left lateral deltoid. Patient presents with decreased shoulder abduction and flexion. Patient also presents with decrease strength on her Left UE. Patient  will benefit from physical therapy to regain ROM and strength of Left UE.   OBJECTIVE IMPAIRMENTS: decreased activity tolerance, decreased ROM, and decreased strength.   ACTIVITY LIMITATIONS: carrying and lifting  PARTICIPATION LIMITATIONS: meal prep, cleaning, laundry, driving, shopping, community activity, occupation, and yard work  PERSONAL FACTORS: 1-2 comorbidities: arthritis, polymyalgia   are also affecting patient's functional outcome.   REHAB POTENTIAL: Excellent  CLINICAL DECISION MAKING: Stable/uncomplicated  EVALUATION COMPLEXITY: Low   GOALS: Goals reviewed with patient? Yes  SHORT TERM GOALS: Target date: 4/25 Patient will increase shoulder abduction and shoulder flexion by 10 degrees Baseline: Goal status: INITIAL  2.  Patient will increase strength on L UE by 5 lbs.  Baseline:  Goal status: INITIAL  3.  Patient will be able to  Baseline:  Goal status: INITIAL  LONG TERM GOALS: Target date: 08/02/2022    Patient will be able to read her book without any tightness.  Baseline:  Goal status: INITIAL  2.  Patient will be able to do ADL's without any pain or crunching sensation. Baseline:  Goal status: INITIAL  3.  Patient will reach overhead with full shoulder flexion in order to perform ADL's  Baseline:  Goal status: INITIAL     PLAN:  PT FREQUENCY: 1-2x/week  PT DURATION: 8 weeks  PLANNED INTERVENTIONS: Therapeutic exercises, Therapeutic activity, Neuromuscular re-education, Balance training, Gait training, Patient/Family education, Self Care, Joint mobilization, Joint manipulation, Aquatic Therapy, Dry Needling, Electrical stimulation, Cryotherapy, Moist heat, Taping, Ionotophoresis /ml Dexamethasone, and Manual therapy  PLAN FOR NEXT SESSION: Consider  sidelying ER ER, wand flexion, shoulder flexion with wand. Posterior and inferior shoulder glides; Monitor trigger points and consider TPDN PRN.    Dessie Coma, PT 06/21/2022, 8:54  PM  Cristal Ford SPT  I have reviewed and concur with this student's documentation.   During this treatment session, the therapist was present, participating in and directing the treatment.

## 2022-06-22 NOTE — Addendum Note (Signed)
Addended by: Dessie Coma on: 06/22/2022 01:24 PM   Modules accepted: Orders

## 2022-07-05 ENCOUNTER — Ambulatory Visit (HOSPITAL_BASED_OUTPATIENT_CLINIC_OR_DEPARTMENT_OTHER): Payer: Medicare Other

## 2022-07-05 ENCOUNTER — Encounter (HOSPITAL_BASED_OUTPATIENT_CLINIC_OR_DEPARTMENT_OTHER): Payer: Self-pay

## 2022-07-05 DIAGNOSIS — R262 Difficulty in walking, not elsewhere classified: Secondary | ICD-10-CM | POA: Diagnosis not present

## 2022-07-05 DIAGNOSIS — R293 Abnormal posture: Secondary | ICD-10-CM

## 2022-07-05 DIAGNOSIS — M25512 Pain in left shoulder: Secondary | ICD-10-CM | POA: Diagnosis not present

## 2022-07-05 DIAGNOSIS — M6281 Muscle weakness (generalized): Secondary | ICD-10-CM | POA: Diagnosis not present

## 2022-07-05 NOTE — Therapy (Signed)
OUTPATIENT PHYSICAL THERAPY LOWER EXTREMITY EVALUATION   Patient Name: Sonia [name] MRN: [id number] DOB:02-16-1941, 82 y.o., female Today's Date: 07/05/2022  END OF SESSION:  PT End of Session - 07/05/22 1506     Visit Number 2    Number of Visits 12    Date for PT Re-Evaluation 08/02/22    PT Start Time 1435    PT Stop Time 1518    PT Time Calculation (min) 43 min    Activity Tolerance Patient tolerated treatment well    Behavior During Therapy [facility] for tasks assessed/performed              Past Medical History:  Diagnosis Date   Arthritis    Asthma    daily and prn inhalers; states good control currently   Breast cancer    right   CAD S/P percutaneous coronary angioplasty 07/15/2016   COPD (chronic obstructive pulmonary disease)    Dyspnea    occ with exertion   Fibromyalgia    GERD (gastroesophageal reflux disease)    no current med.   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/31/2021   Neck Posterior   Myocardial infarction    Pneumonia    Sclerosing adenosis of left breast 03/2015   Vitamin D deficiency    Past Surgical History:  Procedure Laterality Date   ABDOMINAL HYSTERECTOMY     partial   APPENDECTOMY     BREAST BIOPSY Left    BREAST CYST EXCISION Left    BREAST EXCISIONAL BIOPSY Left 03/2015   BREAST LUMPECTOMY Right 08/2018   BREAST LUMPECTOMY WITH RADIOACTIVE SEED LOCALIZATION Left 04/07/2015   Procedure: BREAST LUMPECTOMY WITH RADIOACTIVE SEED LOCALIZATION;  Surgeon: Harriette Bouillon, MD;  Location: [facility];  Service: General;  Laterality: Left;   BREAST LUMPECTOMY WITH RADIOACTIVE SEED LOCALIZATION Right 08/13/2018   Procedure: RIGHT BREAST LUMPECTOMY WITH RADIOACTIVE SEED LOCALIZATION;  Surgeon: Emelia Loron, MD;  Location: [facility];  Service: General;  Laterality: Right;   COLONOSCOPY     CORONARY STENT INTERVENTION N/A 07/15/2016   Procedure: Coronary Stent Intervention;  Surgeon: Tonny Bollman, MD;  Location: [facility] INVASIVE CV LAB;  Service: Cardiovascular;  Laterality: N/A;   ESOPHAGOGASTRODUODENOSCOPY (EGD) WITH PROPOFOL Left 07/25/2016   Procedure: ESOPHAGOGASTRODUODENOSCOPY (EGD) WITH PROPOFOL;  Surgeon: Kerin Salen, MD;  Location: [facility] ENDOSCOPY;  Service: Gastroenterology;  Laterality: Left;   KNEE ARTHROSCOPY Right 12/07/2003   LAPAROSCOPIC SALPINGO OOPHERECTOMY Right 09/08/2003   LEFT HEART CATH N/A 07/15/2016   Procedure: Left Heart Cath;  Surgeon: Tonny Bollman, MD;  Location: [facility] INVASIVE CV LAB;  Service: Cardiovascular;  Laterality: N/A;   LEFT HEART CATH AND CORONARY ANGIOGRAPHY N/A 04/14/2019   Procedure: LEFT HEART CATH AND CORONARY ANGIOGRAPHY;  Surgeon: Kathleene Hazel, MD;  Location: [facility] INVASIVE CV LAB;  Service: Cardiovascular;  Laterality: N/A;   LYSIS OF ADHESION  09/08/2003   NASAL SINUS SURGERY  age 32   RECTAL POLYPECTOMY  10/02/2006   Patient Active Problem List   Diagnosis Date Noted   COPD with acute exacerbation 03/11/2022   Non-STEMI (non-ST elevated myocardial infarction) 03/11/2022   RSV (respiratory syncytial virus pneumonia) 03/11/2022   Acute bronchiolitis due to respiratory syncytial virus (RSV) 03/11/2022  Coronary artery disease of native artery of native heart with stable angina pectoris    Asthma, chronic, moderate persistent, uncomplicated 02/03/2019   DOE (dyspnea on exertion) 07/31/2018   Osteopenia 06/04/2018   Carcinoma of upper-outer quadrant of right breast in female, estrogen receptor positive 05/30/2018   GI bleed 07/25/2016   Melena 07/24/2016   Acute renal insufficiency 07/24/2016   CAD S/P percutaneous coronary angioplasty 07/16/2016   NSTEMI (non-ST elevated myocardial infarction) 07/13/2016   Genetic  testing 06/09/2015   Family history of breast cancer in female 05/10/2015   History of melanoma 05/10/2015   Atypical lobular hyperplasia of left breast 04/26/2015   Vision problem 01/08/2014   Hiatus hernia syndrome 08/01/2012   Palpitations 10/18/2010   Acute chest pain 10/18/2010   Hypothyroidism 10/18/2010   Dyslipidemia 06/07/2009   Upper airway cough syndrome 03/29/2009   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   GERD 12/05/2006    PCP: Mila Palmer MD  REFERRING PROVIDER: Mila Palmer MD REFERRING DIAG: 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 PERTINENT HISTORY: Polymyalgia, arthritis,  PAIN:  Patient reports no pain, but reports that her Left lateral deltoids feels crunchy.  PRECAUTIONS: None  WEIGHT BEARING RESTRICTIONS: No  FALLS:  Has patient fallen in last 6 months? No  LIVING ENVIRONMENT:   OCCUPATION: retired  PLOF: Independent  PATIENT GOALS: be able to read without pain  NEXT MD VISIT: 4/26  OBJECTIVE:   DIAGNOSTIC FINDINGS: per patient right shoulder OA   PATIENT SURVEYS:  FOTO    COGNITION: Overall cognitive status: Within functional limits for tasks assessed     SENSATION: WFL  EDEMA:    MUSCLE LENGTH:   POSTURE: No Significant postural limitations  PALPATION: Tightness in L lateral deltoid   Left shoulder flexion: 115 degrees Left Shoulder abduction 65 degrees   LOWER EXTREMITY MMT:  MMT Right eval Left eval  Hip flexion    Hip extension    Hip abduction    Hip adduction    Hip internal rotation    Hip external rotation    Knee flexion    Knee extension    Ankle dorsiflexion    Ankle plantarflexion    Ankle inversion     Ankle eversion     (Blank rows = not tested)  LOWER EXTREMITY SPECIAL TESTS:    FUNCTIONAL TESTS:    GAIT:   TODAY'S TREATMENT:                                                                                                                              DATE:   4/25 -PROM L shoulder -GHJ mobilizations inf and post glides grade I-II -Wand press up into flexion x10 -  seated scap sqz 5"x10 -posterior shoulder rolls -pulleys 2' flexion -ball rolls at table flexion and scaption x10ea -HEP update and review (print out next session) 4/11   Supine Shoulder Press AAROM in Abduction with Dowel Shoulder extension with resistance  Scapular Retraction with Resistance    PATIENT EDUCATION:  Education details: HEP, POC, symptom management  Person educated: Patient Education method: Explanation, Demonstration, Tactile cues, Verbal cues, and Handouts Education comprehension: verbalized understanding, returned demonstration, verbal cues required, tactile cues required, and needs further education  HOME EXERCISE PROGRAM: Access Code: [facility] URL: [email or web address] Date: 06/21/2022 Prepared by: Lorayne Bender  Exercises - Supine Shoulder Press AAROM in Abduction with Dowel  - 1 x daily - 7 x weekly - 3 sets - 10 reps - Shoulder extension with resistance - Neutral  - 1 x daily - 7 x weekly - 3 sets - 10 reps - Scapular Retraction with Resistance  - 1 x daily - 7 x weekly - 3 sets - 10 reps  ASSESSMENT:  CLINICAL IMPRESSION: Pt stiff and painful into shoulder elevation initially, but this improved following GHJ mobilizations and PROM. Instructed pt to stay within pain limitations with exercises as well as with HEP. She demonstrates fwd and rounded posture which is likely contributing to shoulder pain. Will continue to work on postural re-ed and GHJ ROM and strength.   OBJECTIVE IMPAIRMENTS: decreased activity tolerance, decreased ROM, and decreased strength.    ACTIVITY LIMITATIONS: carrying and lifting  PARTICIPATION LIMITATIONS: meal prep, cleaning, laundry, driving, shopping, community activity, occupation, and yard work  PERSONAL FACTORS: 1-2 comorbidities: arthritis, polymyalgia   are also affecting patient's functional outcome.   REHAB POTENTIAL: Excellent  CLINICAL DECISION MAKING: Stable/uncomplicated  EVALUATION COMPLEXITY: Low   GOALS: Goals reviewed with patient? Yes  SHORT TERM GOALS: Target date: 4/25 Patient will increase shoulder abduction and shoulder flexion by 10 degrees Baseline: Goal status: INITIAL  2.  Patient will increase strength on L UE by 5 lbs.  Baseline:  Goal status: INITIAL  3.  Patient will be able to  Baseline:  Goal status: INITIAL  LONG TERM GOALS: Target date: 08/02/2022    Patient will be able to read her book without any tightness.  Baseline:  Goal status: INITIAL  2.  Patient will be able to do ADL's without any pain or crunching sensation. Baseline:  Goal status: INITIAL  3.  Patient will reach overhead with full shoulder flexion in order to perform ADL's  Baseline:  Goal status: INITIAL     PLAN:  PT FREQUENCY: 1-2x/week  PT DURATION: 8 weeks  PLANNED INTERVENTIONS: Therapeutic exercises, Therapeutic activity, Neuromuscular re-education, Balance training, Gait training, Patient/Family education, Self Care, Joint mobilization, Joint manipulation, Aquatic Therapy, Dry Needling, Electrical stimulation, Cryotherapy, Moist heat, Taping, Ionotophoresis /ml Dexamethasone, and Manual therapy  PLAN FOR NEXT SESSION: Consider  sidelying ER ER, wand flexion, shoulder flexion with wand. Posterior and inferior shoulder glides; Monitor trigger points and consider TPDN PRN.    Donnel Saxon Fumi Guadron, PTA 07/05/2022, 4:41 PM

## 2022-07-06 ENCOUNTER — Ambulatory Visit: Payer: Medicare Other | Attending: Family Medicine

## 2022-07-10 ENCOUNTER — Ambulatory Visit (HOSPITAL_BASED_OUTPATIENT_CLINIC_OR_DEPARTMENT_OTHER): Payer: Medicare Other

## 2022-07-10 ENCOUNTER — Encounter (HOSPITAL_BASED_OUTPATIENT_CLINIC_OR_DEPARTMENT_OTHER): Payer: Self-pay

## 2022-07-10 DIAGNOSIS — R293 Abnormal posture: Secondary | ICD-10-CM

## 2022-07-10 DIAGNOSIS — M25512 Pain in left shoulder: Secondary | ICD-10-CM | POA: Diagnosis not present

## 2022-07-10 DIAGNOSIS — M6281 Muscle weakness (generalized): Secondary | ICD-10-CM | POA: Diagnosis not present

## 2022-07-10 DIAGNOSIS — R262 Difficulty in walking, not elsewhere classified: Secondary | ICD-10-CM

## 2022-07-10 NOTE — Therapy (Signed)
OUTPATIENT PHYSICAL THERAPY LOWER EXTREMITY EVALUATION   Patient Name: Jessikah Dicker MRN: [id number] DOB:04-29-1940, 82 y.o., female Today's Date: 07/10/2022  END OF SESSION:  PT End of Session - 07/10/22 1413     Visit Number 3    Number of Visits 12    Date for PT Re-Evaluation 08/02/22    PT Start Time 1347    PT Stop Time 1430    PT Time Calculation (min) 43 min    Activity Tolerance Patient tolerated treatment well    Behavior During Therapy [facility] for tasks assessed/performed               Past Medical History:  Diagnosis Date   Arthritis    Asthma    daily and prn inhalers; states good control currently   Breast cancer (HCC)    right   CAD S/P percutaneous coronary angioplasty 07/15/2016   COPD (chronic obstructive pulmonary disease) (HCC)    Dyspnea    occ with exertion   Fibromyalgia    GERD (gastroesophageal reflux disease)    no current med.   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   Vitamin D deficiency    Past Surgical History:  Procedure Laterality Date   ABDOMINAL HYSTERECTOMY     partial   APPENDECTOMY     BREAST BIOPSY Left    BREAST CYST EXCISION Left    BREAST EXCISIONAL BIOPSY Left 03/2015   BREAST LUMPECTOMY Right 08/2018   BREAST LUMPECTOMY WITH RADIOACTIVE SEED LOCALIZATION Left 04/07/2015   Procedure: BREAST LUMPECTOMY WITH RADIOACTIVE SEED LOCALIZATION;  Surgeon: Harriette Bouillon, MD;  Location: [facility];  Service: General;  Laterality: Left;   BREAST LUMPECTOMY WITH RADIOACTIVE SEED LOCALIZATION Right 08/13/2018   Procedure: RIGHT BREAST LUMPECTOMY WITH RADIOACTIVE SEED  LOCALIZATION;  Surgeon: Emelia Loron, MD;  Location: [facility];  Service: General;  Laterality: Right;   COLONOSCOPY     CORONARY STENT INTERVENTION N/A 07/15/2016   Procedure: Coronary Stent Intervention;  Surgeon: Tonny Bollman, MD;  Location: [facility] INVASIVE CV LAB;  Service: Cardiovascular;  Laterality: N/A;   ESOPHAGOGASTRODUODENOSCOPY (EGD) WITH PROPOFOL Left 07/25/2016   Procedure: ESOPHAGOGASTRODUODENOSCOPY (EGD) WITH PROPOFOL;  Surgeon: Kerin Salen, MD;  Location: [facility] ENDOSCOPY;  Service: Gastroenterology;  Laterality: Left;   KNEE ARTHROSCOPY Right 12/07/2003   LAPAROSCOPIC SALPINGO OOPHERECTOMY Right 09/08/2003   LEFT HEART CATH N/A 07/15/2016   Procedure: Left Heart Cath;  Surgeon: Tonny Bollman, MD;  Location: [facility] INVASIVE CV LAB;  Service: Cardiovascular;  Laterality: N/A;   LEFT HEART CATH AND CORONARY ANGIOGRAPHY N/A 04/14/2019   Procedure: LEFT HEART CATH AND CORONARY ANGIOGRAPHY;  Surgeon: Kathleene Hazel, MD;  Location: [facility] INVASIVE CV LAB;  Service: Cardiovascular;  Laterality: N/A;   LYSIS OF ADHESION  09/08/2003   NASAL SINUS SURGERY  age 11   RECTAL POLYPECTOMY  10/02/2006   Patient Active Problem List   Diagnosis Date Noted   COPD with acute exacerbation (HCC) 03/11/2022   Non-STEMI (non-ST elevated myocardial infarction) (HCC) 03/11/2022   RSV (respiratory syncytial virus pneumonia) 03/11/2022   Acute bronchiolitis due  to respiratory syncytial virus (RSV) 03/11/2022   Coronary artery disease of native artery of native heart with stable angina pectoris (HCC)    Asthma, chronic, moderate persistent, uncomplicated 02/03/2019   DOE (dyspnea on exertion) 07/31/2018   Osteopenia 06/04/2018   Carcinoma of upper-outer quadrant of right breast in female, estrogen receptor positive (HCC) 05/30/2018   GI bleed 07/25/2016   Melena 07/24/2016   Acute renal insufficiency 07/24/2016   CAD S/P percutaneous coronary angioplasty 07/16/2016   NSTEMI  (non-ST elevated myocardial infarction) (HCC) 07/13/2016   Genetic testing 06/09/2015   Family history of breast cancer in female 05/10/2015   History of melanoma 05/10/2015   Atypical lobular hyperplasia of left breast 04/26/2015   Vision problem 01/08/2014   Hiatus hernia syndrome 08/01/2012   Palpitations 10/18/2010   Acute chest pain 10/18/2010   Hypothyroidism 10/18/2010   Dyslipidemia 06/07/2009   Upper airway cough syndrome 03/29/2009   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 "It's cloudy today so I'm not doing well." Better pain level today, but was 8/10 Sunday. 0/10 when asked.   PERTINENT HISTORY: Polymyalgia, arthritis,  PAIN:  Patient reports no pain, but reports that her Left lateral deltoids feels crunchy.  PRECAUTIONS: None  WEIGHT BEARING RESTRICTIONS: No  FALLS:  Has patient fallen in last 6 months? No  LIVING ENVIRONMENT:   OCCUPATION: retired  PLOF: Independent  PATIENT GOALS: be able to read without pain  NEXT MD VISIT: 4/26  OBJECTIVE:   DIAGNOSTIC FINDINGS: per patient right shoulder OA   PATIENT SURVEYS:  FOTO    COGNITION: Overall cognitive status: Within functional limits for tasks assessed     SENSATION: WFL  EDEMA:    MUSCLE LENGTH:   POSTURE: No Significant postural limitations  PALPATION: Tightness in L lateral deltoid   Left shoulder flexion: 115 degrees Left Shoulder abduction 65 degrees   LOWER EXTREMITY MMT:  MMT Right eval Left eval  Hip flexion    Hip extension    Hip abduction    Hip  adduction    Hip internal rotation    Hip external rotation    Knee flexion    Knee extension    Ankle dorsiflexion    Ankle plantarflexion    Ankle inversion    Ankle eversion     (Blank rows = not tested)  LOWER EXTREMITY SPECIAL TESTS:    FUNCTIONAL TESTS:    GAIT:   TODAY'S TREATMENT:  DATE:   4/30 -PROM L shoulder STM triceps/post. delt -GHJ mobilizations inf glides grade I-II -Wand press up into flexion x10 -TB row-YTB  2x10 -posterior shoulder rolls -pulleys 2' flexion -review of table slides for HEP   4/25 -PROM L shoulder -GHJ mobilizations inf and post glides grade I-II -Wand press up into flexion x10 -seated scap sqz 5"x10 -posterior shoulder rolls -pulleys 2' flexion -ball rolls at table flexion and scaption x10ea -HEP update and review (print out next session) 4/11   Supine Shoulder Press AAROM in Abduction with Dowel Shoulder extension with resistance  Scapular Retraction with Resistance    PATIENT EDUCATION:  Education details: HEP, POC, symptom management  Person educated: Patient Education method: Explanation, Demonstration, Tactile cues, Verbal cues, and Handouts Education comprehension: verbalized understanding, returned demonstration, verbal cues required, tactile cues required, and needs further education  HOME EXERCISE PROGRAM: Access Code: [facility] URL: [email or web address] Date: 06/21/2022 Prepared by: Lorayne Bender  Exercises - Supine Shoulder Press AAROM in Abduction with Dowel  - 1 x daily - 7 x weekly - 3 sets - 10 reps - Shoulder extension with resistance - Neutral  - 1 x daily - 7 x weekly - 3 sets - 10 reps - Scapular Retraction with Resistance  - 1 x daily - 7 x weekly - 3 sets - 10 reps  ASSESSMENT:  CLINICAL IMPRESSION: Very tender to palpation of length of triceps and posterior  delt. Worked on gentle STM to help reduce restrictions. Educated pt about DOMS and to use heat/ice at home. No pain overall with flexion, but does have pain with 90/90 movements per pt demonstration. Will continue with gentle ROM and stabilization/strengthening as tolerated.   OBJECTIVE IMPAIRMENTS: decreased activity tolerance, decreased ROM, and decreased strength.   ACTIVITY LIMITATIONS: carrying and lifting  PARTICIPATION LIMITATIONS: meal prep, cleaning, laundry, driving, shopping, community activity, occupation, and yard work  PERSONAL FACTORS: 1-2 comorbidities: arthritis, polymyalgia   are also affecting patient's functional outcome.   REHAB POTENTIAL: Excellent  CLINICAL DECISION MAKING: Stable/uncomplicated  EVALUATION COMPLEXITY: Low   GOALS: Goals reviewed with patient? Yes  SHORT TERM GOALS: Target date: 4/25 Patient will increase shoulder abduction and shoulder flexion by 10 degrees Baseline: Goal status: INITIAL  2.  Patient will increase strength on L UE by 5 lbs.  Baseline:  Goal status: INITIAL  3.  Patient will be able to  Baseline:  Goal status: INITIAL  LONG TERM GOALS: Target date: 08/02/2022    Patient will be able to read her book without any tightness.  Baseline:  Goal status: INITIAL  2.  Patient will be able to do ADL's without any pain or crunching sensation. Baseline:  Goal status: INITIAL  3.  Patient will reach overhead with full shoulder flexion in order to perform ADL's  Baseline:  Goal status: INITIAL     PLAN:  PT FREQUENCY: 1-2x/week  PT DURATION: 8 weeks  PLANNED INTERVENTIONS: Therapeutic exercises, Therapeutic activity, Neuromuscular re-education, Balance training, Gait training, Patient/Family education, Self Care, Joint mobilization, Joint manipulation, Aquatic Therapy, Dry Needling, Electrical stimulation, Cryotherapy, Moist heat, Taping, Ionotophoresis 4mg /ml Dexamethasone, and Manual therapy  PLAN FOR NEXT SESSION:  Consider  sidelying ER ER, wand flexion, shoulder flexion with wand. Posterior and inferior shoulder glides; Monitor trigger points and consider TPDN PRN.    Donnel Saxon Olanna Percifield, PTA 07/10/2022, 3:18 PM

## 2022-07-10 NOTE — Addendum Note (Signed)
Addended by: Dessie Coma on: 07/10/2022 05:40 PM   Modules accepted: Orders

## 2022-07-17 ENCOUNTER — Ambulatory Visit (HOSPITAL_BASED_OUTPATIENT_CLINIC_OR_DEPARTMENT_OTHER): Payer: Medicare Other

## 2022-07-19 ENCOUNTER — Ambulatory Visit (HOSPITAL_BASED_OUTPATIENT_CLINIC_OR_DEPARTMENT_OTHER): Payer: Medicare Other

## 2022-07-20 ENCOUNTER — Ambulatory Visit (HOSPITAL_BASED_OUTPATIENT_CLINIC_OR_DEPARTMENT_OTHER): Payer: Medicare Other | Attending: Family Medicine

## 2022-07-20 ENCOUNTER — Encounter (HOSPITAL_BASED_OUTPATIENT_CLINIC_OR_DEPARTMENT_OTHER): Payer: Self-pay

## 2022-07-20 DIAGNOSIS — M25512 Pain in left shoulder: Secondary | ICD-10-CM | POA: Insufficient documentation

## 2022-07-20 DIAGNOSIS — R262 Difficulty in walking, not elsewhere classified: Secondary | ICD-10-CM | POA: Insufficient documentation

## 2022-07-20 DIAGNOSIS — M6281 Muscle weakness (generalized): Secondary | ICD-10-CM | POA: Insufficient documentation

## 2022-07-20 DIAGNOSIS — R293 Abnormal posture: Secondary | ICD-10-CM

## 2022-07-20 NOTE — Therapy (Signed)
OUTPATIENT PHYSICAL THERAPY LOWER EXTREMITY TREATMENT   Patient Name: Tenequa Constance MRN: [id number] DOB:07-02-1940, 82 y.o., female Today's Date: 07/20/2022  END OF SESSION:  PT End of Session - 07/20/22 1307     Visit Number 4    Number of Visits 12    Date for PT Re-Evaluation 08/02/22    PT Start Time 1306    PT Stop Time 1345    PT Time Calculation (min) 39 min    Activity Tolerance Patient tolerated treatment well    Behavior During Therapy [facility] for tasks assessed/performed                Past Medical History:  Diagnosis Date   Arthritis    Asthma    daily and prn inhalers; states good control currently   Breast cancer (HCC)    right   CAD S/P percutaneous coronary angioplasty 07/15/2016   COPD (chronic obstructive pulmonary disease) (HCC)    Dyspnea    occ with exertion   Fibromyalgia    GERD (gastroesophageal reflux disease)    no current med.   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   Vitamin D deficiency    Past Surgical History:  Procedure Laterality Date   ABDOMINAL HYSTERECTOMY     partial   APPENDECTOMY     BREAST BIOPSY Left    BREAST CYST EXCISION Left    BREAST EXCISIONAL BIOPSY Left 03/2015   BREAST LUMPECTOMY Right 08/2018   BREAST LUMPECTOMY WITH RADIOACTIVE SEED LOCALIZATION Left 04/07/2015   Procedure: BREAST LUMPECTOMY WITH RADIOACTIVE SEED LOCALIZATION;  Surgeon: Harriette Bouillon, MD;  Location: [facility];  Service: General;  Laterality: Left;   BREAST LUMPECTOMY WITH RADIOACTIVE SEED LOCALIZATION Right 08/13/2018   Procedure: RIGHT BREAST LUMPECTOMY WITH RADIOACTIVE SEED  LOCALIZATION;  Surgeon: Emelia Loron, MD;  Location: [facility];  Service: General;  Laterality: Right;   COLONOSCOPY     CORONARY STENT INTERVENTION N/A 07/15/2016   Procedure: Coronary Stent Intervention;  Surgeon: Tonny Bollman, MD;  Location: [facility] INVASIVE CV LAB;  Service: Cardiovascular;  Laterality: N/A;   ESOPHAGOGASTRODUODENOSCOPY (EGD) WITH PROPOFOL Left 07/25/2016   Procedure: ESOPHAGOGASTRODUODENOSCOPY (EGD) WITH PROPOFOL;  Surgeon: Kerin Salen, MD;  Location: [facility] ENDOSCOPY;  Service: Gastroenterology;  Laterality: Left;   KNEE ARTHROSCOPY Right 12/07/2003   LAPAROSCOPIC SALPINGO OOPHERECTOMY Right 09/08/2003   LEFT HEART CATH N/A 07/15/2016   Procedure: Left Heart Cath;  Surgeon: Tonny Bollman, MD;  Location: [facility] INVASIVE CV LAB;  Service: Cardiovascular;  Laterality: N/A;   LEFT HEART CATH AND CORONARY ANGIOGRAPHY N/A 04/14/2019   Procedure: LEFT HEART CATH AND CORONARY ANGIOGRAPHY;  Surgeon: Kathleene Hazel, MD;  Location: [facility] INVASIVE CV LAB;  Service: Cardiovascular;  Laterality: N/A;   LYSIS OF ADHESION  09/08/2003   NASAL SINUS SURGERY  age 69   RECTAL POLYPECTOMY  10/02/2006   Patient Active Problem List   Diagnosis Date Noted   COPD with acute exacerbation (HCC) 03/11/2022   Non-STEMI (non-ST elevated myocardial infarction) (HCC) 03/11/2022   RSV (respiratory syncytial virus pneumonia) 03/11/2022   Acute bronchiolitis  due to respiratory syncytial virus (RSV) 03/11/2022   Coronary artery disease of native artery of native heart with stable angina pectoris (HCC)    Asthma, chronic, moderate persistent, uncomplicated 02/03/2019   DOE (dyspnea on exertion) 07/31/2018   Osteopenia 06/04/2018   Carcinoma of upper-outer quadrant of right breast in female, estrogen receptor positive (HCC) 05/30/2018   GI bleed 07/25/2016   Melena 07/24/2016   Acute renal insufficiency 07/24/2016   CAD S/P percutaneous coronary angioplasty 07/16/2016   NSTEMI  (non-ST elevated myocardial infarction) (HCC) 07/13/2016   Genetic testing 06/09/2015   Family history of breast cancer in female 05/10/2015   History of melanoma 05/10/2015   Atypical lobular hyperplasia of left breast 04/26/2015   Vision problem 01/08/2014   Hiatus hernia syndrome 08/01/2012   Palpitations 10/18/2010   Acute chest pain 10/18/2010   Hypothyroidism 10/18/2010   Dyslipidemia 06/07/2009   Upper airway cough syndrome 03/29/2009   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 "It's cloudy today so I'm not doing well." Better pain level today, but was 8/10 Sunday. 0/10 when asked.   PERTINENT HISTORY: Polymyalgia, arthritis,  PAIN:  Patient reports no pain, but reports that her Left lateral deltoids feels crunchy.  PRECAUTIONS: None  WEIGHT BEARING RESTRICTIONS: No  FALLS:  Has patient fallen in last 6 months? No  LIVING ENVIRONMENT:   OCCUPATION: retired  PLOF: Independent  PATIENT GOALS: be able to read without pain  NEXT MD VISIT: 4/26  OBJECTIVE:   DIAGNOSTIC FINDINGS: per patient right shoulder OA   PATIENT SURVEYS:  FOTO    COGNITION: Overall cognitive status: Within functional limits for tasks assessed     SENSATION: WFL  EDEMA:    MUSCLE LENGTH:   POSTURE: No Significant postural limitations  PALPATION: Tightness in L lateral deltoid   Left shoulder flexion: 115 degrees Left Shoulder abduction 65 degrees   LOWER EXTREMITY MMT:  MMT  Right eval Left eval  Hip flexion    Hip extension    Hip abduction    Hip adduction    Hip internal rotation    Hip external rotation    Knee flexion    Knee extension    Ankle dorsiflexion    Ankle plantarflexion    Ankle inversion    Ankle eversion     (Blank rows = not tested)  LOWER EXTREMITY SPECIAL TESTS:    FUNCTIONAL TESTS:    GAIT:   TODAY'S TREATMENT:  DATE:    5/10 -PROM L shoulder (wedge under knees) -STM triceps, UT  -Wand flexion x10 -posterior shoulder rolls x15 -seated bicep curls 1# 2x10 -pulleys 2' flexion Ball rolls at table 2x10 flexion  4/30 -PROM L shoulder STM triceps/post. delt -GHJ mobilizations inf glides grade I-II -Wand press up into flexion x10 -TB row-YTB  2x10 -posterior shoulder rolls -pulleys 2' flexion -review of table slides for HEP   4/25 -PROM L shoulder -GHJ mobilizations inf and post glides grade I-II -Wand press up into flexion x10 -seated scap sqz 5"x10 -posterior shoulder rolls -pulleys 2' flexion -ball rolls at table flexion and scaption x10ea -HEP update and review (print out next session)   PATIENT EDUCATION:  Education details: HEP, POC, symptom management  Person educated: Patient Education method: Explanation, Demonstration, Tactile cues, Verbal cues, and Handouts Education comprehension: verbalized understanding, returned demonstration, verbal cues required, tactile cues required, and needs further education  HOME EXERCISE PROGRAM: Access Code: [facility] URL: [email or web address] Date: 06/21/2022 Prepared by: Lorayne Bender  Exercises - Supine Shoulder Press AAROM in Abduction with Dowel  - 1 x daily - 7 x weekly - 3 sets - 10 reps - Shoulder extension with resistance - Neutral  - 1 x daily - 7 x weekly - 3 sets - 10 reps - Scapular Retraction with  Resistance  - 1 x daily - 7 x weekly - 3 sets - 10 reps  ASSESSMENT:  CLINICAL IMPRESSION: Very tight into L upper traps. Spent time here utilizing STM and TPR to reduce restrictions. Also performed STM to triceps where she remains tight and tender. Improved PROM tolerance today compared to previous session. She has most discomfort with end range passive flexion and abduction. Able to complete AAROM activities without significant difficulty or discomfort. Pt will benefit from continued PT to address functional deficits and pain level. Will continue to progress as tolerated.  OBJECTIVE IMPAIRMENTS: decreased activity tolerance, decreased ROM, and decreased strength.   ACTIVITY LIMITATIONS: carrying and lifting  PARTICIPATION LIMITATIONS: meal prep, cleaning, laundry, driving, shopping, community activity, occupation, and yard work  PERSONAL FACTORS: 1-2 comorbidities: arthritis, polymyalgia   are also affecting patient's functional outcome.   REHAB POTENTIAL: Excellent  CLINICAL DECISION MAKING: Stable/uncomplicated  EVALUATION COMPLEXITY: Low   GOALS: Goals reviewed with patient? Yes  SHORT TERM GOALS: Target date: 4/25 Patient will increase shoulder abduction and shoulder flexion by 10 degrees Baseline: Goal status: INITIAL  2.  Patient will increase strength on L UE by 5 lbs.  Baseline:  Goal status: INITIAL  3.  Patient will be able to  Baseline:  Goal status: INITIAL  LONG TERM GOALS: Target date: 08/02/2022    Patient will be able to read her book without any tightness.  Baseline:  Goal status: INITIAL  2.  Patient will be able to do ADL's without any pain or crunching sensation. Baseline:  Goal status: INITIAL  3.  Patient will reach overhead with full shoulder flexion in order to perform ADL's  Baseline:  Goal status: INITIAL     PLAN:  PT FREQUENCY: 1-2x/week  PT DURATION: 8 weeks  PLANNED INTERVENTIONS: Therapeutic exercises, Therapeutic  activity, Neuromuscular re-education, Balance training, Gait training, Patient/Family education, Self Care, Joint mobilization, Joint manipulation, Aquatic Therapy, Dry Needling, Electrical stimulation, Cryotherapy, Moist heat, Taping, Ionotophoresis 4mg /ml Dexamethasone, and Manual therapy  PLAN FOR NEXT SESSION: Consider  sidelying ER ER, wand flexion, shoulder flexion with wand. Posterior and inferior shoulder glides; Monitor trigger points and consider TPDN PRN.  Donnel Saxon Kaysie Michelini, PTA 07/20/2022, 2:43 PM

## 2022-07-23 ENCOUNTER — Encounter (HOSPITAL_BASED_OUTPATIENT_CLINIC_OR_DEPARTMENT_OTHER): Payer: Self-pay

## 2022-07-23 ENCOUNTER — Ambulatory Visit (HOSPITAL_BASED_OUTPATIENT_CLINIC_OR_DEPARTMENT_OTHER): Payer: Medicare Other

## 2022-07-23 DIAGNOSIS — M6281 Muscle weakness (generalized): Secondary | ICD-10-CM | POA: Diagnosis not present

## 2022-07-23 DIAGNOSIS — R293 Abnormal posture: Secondary | ICD-10-CM | POA: Diagnosis not present

## 2022-07-23 DIAGNOSIS — R262 Difficulty in walking, not elsewhere classified: Secondary | ICD-10-CM | POA: Diagnosis not present

## 2022-07-23 DIAGNOSIS — M25512 Pain in left shoulder: Secondary | ICD-10-CM

## 2022-07-23 NOTE — Therapy (Signed)
OUTPATIENT PHYSICAL THERAPY LOWER EXTREMITY TREATMENT   Patient Name: Sonia [name] MRN: [id number] DOB:1941/03/02, 82 y.o., female Today's Date: 07/23/2022  END OF SESSION:  PT End of Session - 07/23/22 1610     Visit Number 5    Number of Visits 12    Date for PT Re-Evaluation 08/02/22    PT Start Time 1515    PT Stop Time 1555    PT Time Calculation (min) 40 min    Activity Tolerance Patient tolerated treatment well    Behavior During Therapy [facility] for tasks assessed/performed                 Past Medical History:  Diagnosis Date   Arthritis    Asthma    daily and prn inhalers; states good control currently   Breast cancer (HCC)    right   CAD S/P percutaneous coronary angioplasty 07/15/2016   COPD (chronic obstructive pulmonary disease) (HCC)    Dyspnea    occ with exertion   Fibromyalgia    GERD (gastroesophageal reflux disease)    no current med.   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   Vitamin D deficiency    Past Surgical History:  Procedure Laterality Date   ABDOMINAL HYSTERECTOMY     partial   APPENDECTOMY     BREAST BIOPSY Left    BREAST CYST EXCISION Left    BREAST EXCISIONAL BIOPSY Left 03/2015   BREAST LUMPECTOMY Right 08/2018   BREAST LUMPECTOMY WITH RADIOACTIVE SEED LOCALIZATION Left 04/07/2015   Procedure: BREAST LUMPECTOMY WITH RADIOACTIVE SEED LOCALIZATION;  Surgeon: Harriette Bouillon, MD;  Location: [facility];  Service: General;  Laterality: Left;   BREAST LUMPECTOMY WITH RADIOACTIVE SEED LOCALIZATION Right 08/13/2018   Procedure: RIGHT BREAST LUMPECTOMY WITH RADIOACTIVE SEED  LOCALIZATION;  Surgeon: Emelia Loron, MD;  Location: [facility];  Service: General;  Laterality: Right;   COLONOSCOPY     CORONARY STENT INTERVENTION N/A 07/15/2016   Procedure: Coronary Stent Intervention;  Surgeon: Tonny Bollman, MD;  Location: [facility] INVASIVE CV LAB;  Service: Cardiovascular;  Laterality: N/A;   ESOPHAGOGASTRODUODENOSCOPY (EGD) WITH PROPOFOL Left 07/25/2016   Procedure: ESOPHAGOGASTRODUODENOSCOPY (EGD) WITH PROPOFOL;  Surgeon: Kerin Salen, MD;  Location: [facility] ENDOSCOPY;  Service: Gastroenterology;  Laterality: Left;   KNEE ARTHROSCOPY Right 12/07/2003   LAPAROSCOPIC SALPINGO OOPHERECTOMY Right 09/08/2003   LEFT HEART CATH N/A 07/15/2016   Procedure: Left Heart Cath;  Surgeon: Tonny Bollman, MD;  Location: [facility] INVASIVE CV LAB;  Service: Cardiovascular;  Laterality: N/A;   LEFT HEART CATH AND CORONARY ANGIOGRAPHY N/A 04/14/2019   Procedure: LEFT HEART CATH AND CORONARY ANGIOGRAPHY;  Surgeon: Kathleene Hazel, MD;  Location: [facility] INVASIVE CV LAB;  Service: Cardiovascular;  Laterality: N/A;   LYSIS OF ADHESION  09/08/2003   NASAL SINUS SURGERY  age 35   RECTAL POLYPECTOMY  10/02/2006   Patient Active Problem List   Diagnosis Date Noted   COPD with acute exacerbation (HCC) 03/11/2022   Non-STEMI (non-ST elevated myocardial infarction) (HCC) 03/11/2022   RSV (respiratory syncytial virus pneumonia) 03/11/2022   Acute  bronchiolitis due to respiratory syncytial virus (RSV) 03/11/2022   Coronary artery disease of native artery of native heart with stable angina pectoris (HCC)    Asthma, chronic, moderate persistent, uncomplicated 02/03/2019   DOE (dyspnea on exertion) 07/31/2018   Osteopenia 06/04/2018   Carcinoma of upper-outer quadrant of right breast in female, estrogen receptor positive (HCC) 05/30/2018   GI bleed 07/25/2016   Melena 07/24/2016   Acute renal insufficiency 07/24/2016   CAD S/P percutaneous coronary angioplasty 07/16/2016   NSTEMI  (non-ST elevated myocardial infarction) (HCC) 07/13/2016   Genetic testing 06/09/2015   Family history of breast cancer in female 05/10/2015   History of melanoma 05/10/2015   Atypical lobular hyperplasia of left breast 04/26/2015   Vision problem 01/08/2014   Hiatus hernia syndrome 08/01/2012   Palpitations 10/18/2010   Acute chest pain 10/18/2010   Hypothyroidism 10/18/2010   Dyslipidemia 06/07/2009   Upper airway cough syndrome 03/29/2009   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 She denies significant shoulder pain.  PERTINENT HISTORY: Polymyalgia, arthritis,  PAIN:  Patient reports no pain, but reports that her Left lateral deltoids feels crunchy.  PRECAUTIONS: None  WEIGHT BEARING RESTRICTIONS: No  FALLS:  Has patient fallen in last 6 months? No  LIVING ENVIRONMENT:   OCCUPATION: retired  PLOF: Independent  PATIENT GOALS: be able to read without pain  NEXT MD VISIT: 4/26  OBJECTIVE:   DIAGNOSTIC FINDINGS: per patient right shoulder OA   PATIENT SURVEYS:  FOTO    COGNITION: Overall cognitive status: Within functional limits for tasks assessed     SENSATION: WFL  EDEMA:    MUSCLE LENGTH:   POSTURE: No Significant postural limitations  PALPATION: Tightness in L lateral deltoid   Left shoulder flexion: 115 degrees Left Shoulder abduction 65 degrees   LOWER EXTREMITY MMT:  MMT Right eval Left eval  Hip flexion    Hip extension    Hip  abduction    Hip adduction    Hip internal rotation    Hip external rotation    Knee flexion    Knee extension    Ankle dorsiflexion    Ankle plantarflexion    Ankle inversion    Ankle eversion     (Blank rows = not tested)  LOWER EXTREMITY SPECIAL TESTS:    FUNCTIONAL TESTS:    GAIT:   TODAY'S TREATMENT:  DATE:    5/10 -PROM L shoulder (wedge under knees) -STM triceps, UT  -Wand flexion x10 Supine press active L UE x10 -Rhythmic stabilization -Supine ABC x1 -Seated row with YTB x20 -posterior shoulder rolls x15 -standing bicep curls 2# 2x10 -pulleys 2' flexion -Standing flexion to 90deg x15 Ball rolls at table 2x10 flexion  4/30 -PROM L shoulder STM triceps/post. delt -GHJ mobilizations inf glides grade I-II -Wand press up into flexion x10 -TB row-YTB  2x10 -posterior shoulder rolls -pulleys 2' flexion -review of table slides for HEP   4/25 -PROM L shoulder -GHJ mobilizations inf and post glides grade I-II -Wand press up into flexion x10 -seated scap sqz 5"x10 -posterior shoulder rolls -pulleys 2' flexion -ball rolls at table flexion and scaption x10ea -HEP update and review (print out next session)   PATIENT EDUCATION:  Education details: HEP, POC, symptom management  Person educated: Patient Education method: Explanation, Demonstration, Tactile cues, Verbal cues, and Handouts Education comprehension: verbalized understanding, returned demonstration, verbal cues required, tactile cues required, and needs further education  HOME EXERCISE PROGRAM: Access Code: [facility] URL: [email or web address] Date: 06/21/2022 Prepared by: Lorayne Bender  Exercises - Supine Shoulder Press AAROM in Abduction with Dowel  - 1 x daily - 7 x weekly - 3 sets - 10 reps - Shoulder extension with resistance - Neutral  - 1 x daily -  7 x weekly - 3 sets - 10 reps - Scapular Retraction with Resistance  - 1 x daily - 7 x weekly - 3 sets - 10 reps  ASSESSMENT:  CLINICAL IMPRESSION: Pt demonstrates improved ability for AAROM and AROM activities today. Some crepitus in scapulothoracic joint, but no pain with this. Some pain with standing flexion initially, but this subsided quickly. Will continue to progress functional strength and ROM.   OBJECTIVE IMPAIRMENTS: decreased activity tolerance, decreased ROM, and decreased strength.   ACTIVITY LIMITATIONS: carrying and lifting  PARTICIPATION LIMITATIONS: meal prep, cleaning, laundry, driving, shopping, community activity, occupation, and yard work  PERSONAL FACTORS: 1-2 comorbidities: arthritis, polymyalgia   are also affecting patient's functional outcome.   REHAB POTENTIAL: Excellent  CLINICAL DECISION MAKING: Stable/uncomplicated  EVALUATION COMPLEXITY: Low   GOALS: Goals reviewed with patient? Yes  SHORT TERM GOALS: Target date: 4/25 Patient will increase shoulder abduction and shoulder flexion by 10 degrees Baseline: Goal status: INITIAL  2.  Patient will increase strength on L UE by 5 lbs.  Baseline:  Goal status: INITIAL  3.  Patient will be able to  Baseline:  Goal status: INITIAL  LONG TERM GOALS: Target date: 08/02/2022    Patient will be able to read her book without any tightness.  Baseline:  Goal status: INITIAL  2.  Patient will be able to do ADL's without any pain or crunching sensation. Baseline:  Goal status: INITIAL  3.  Patient will reach overhead with full shoulder flexion in order to perform ADL's  Baseline:  Goal status: INITIAL     PLAN:  PT FREQUENCY: 1-2x/week  PT DURATION: 8 weeks  PLANNED INTERVENTIONS: Therapeutic exercises, Therapeutic activity, Neuromuscular re-education, Balance training, Gait training, Patient/Family education, Self Care, Joint mobilization, Joint manipulation, Aquatic Therapy, Dry Needling,  Electrical stimulation, Cryotherapy, Moist heat, Taping, Ionotophoresis 4mg /ml Dexamethasone, and Manual therapy  PLAN FOR NEXT SESSION: Consider  sidelying ER ER, wand flexion, shoulder flexion with wand. Posterior and inferior shoulder glides; Monitor trigger points and consider TPDN PRN.    Donnel Saxon Parker Sawatzky, PTA 07/23/2022, 4:20 PM

## 2022-07-30 ENCOUNTER — Ambulatory Visit (HOSPITAL_BASED_OUTPATIENT_CLINIC_OR_DEPARTMENT_OTHER): Payer: Medicare Other

## 2022-08-01 ENCOUNTER — Ambulatory Visit: Payer: Medicare Other | Admitting: Dermatology

## 2022-08-02 ENCOUNTER — Telehealth (HOSPITAL_BASED_OUTPATIENT_CLINIC_OR_DEPARTMENT_OTHER): Payer: Self-pay | Admitting: Physical Therapy

## 2022-08-02 ENCOUNTER — Ambulatory Visit (HOSPITAL_BASED_OUTPATIENT_CLINIC_OR_DEPARTMENT_OTHER): Payer: Medicare Other | Admitting: Physical Therapy

## 2022-08-02 NOTE — Telephone Encounter (Signed)
Therapy contacted patient over no-show appointment. She reports she has been very sick and unable to call. She has an appointment next week. She was advised that we will have to change the appointment for therapist to see. She thinks she will be able to make that appointment. She was advised to call if she can't.

## 2022-08-04 ENCOUNTER — Inpatient Hospital Stay (HOSPITAL_COMMUNITY)
Admission: EM | Admit: 2022-08-04 | Discharge: 2022-08-08 | DRG: 202 | Disposition: A | Discharge status: Skilled Nursing Facility | Payer: Medicare Other | Attending: Internal Medicine | Admitting: Internal Medicine

## 2022-08-04 ENCOUNTER — Other Ambulatory Visit: Payer: Self-pay

## 2022-08-04 ENCOUNTER — Encounter (HOSPITAL_COMMUNITY): Payer: Self-pay | Admitting: Internal Medicine

## 2022-08-04 ENCOUNTER — Emergency Department (HOSPITAL_COMMUNITY): Payer: Medicare Other

## 2022-08-04 DIAGNOSIS — E8770 Fluid overload, unspecified: Secondary | ICD-10-CM | POA: Diagnosis present

## 2022-08-04 DIAGNOSIS — Z7951 Long term (current) use of inhaled steroids: Secondary | ICD-10-CM | POA: Diagnosis not present

## 2022-08-04 DIAGNOSIS — Z7952 Long term (current) use of systemic steroids: Secondary | ICD-10-CM

## 2022-08-04 DIAGNOSIS — I252 Old myocardial infarction: Secondary | ICD-10-CM

## 2022-08-04 DIAGNOSIS — I480 Paroxysmal atrial fibrillation: Secondary | ICD-10-CM | POA: Diagnosis present

## 2022-08-04 DIAGNOSIS — R0689 Other abnormalities of breathing: Secondary | ICD-10-CM | POA: Diagnosis not present

## 2022-08-04 DIAGNOSIS — J4540 Moderate persistent asthma, uncomplicated: Secondary | ICD-10-CM

## 2022-08-04 DIAGNOSIS — Z883 Allergy status to other anti-infective agents status: Secondary | ICD-10-CM

## 2022-08-04 DIAGNOSIS — E871 Hypo-osmolality and hyponatremia: Principal | ICD-10-CM | POA: Diagnosis present

## 2022-08-04 DIAGNOSIS — Z955 Presence of coronary angioplasty implant and graft: Secondary | ICD-10-CM

## 2022-08-04 DIAGNOSIS — E7800 Pure hypercholesterolemia, unspecified: Secondary | ICD-10-CM | POA: Diagnosis present

## 2022-08-04 DIAGNOSIS — D696 Thrombocytopenia, unspecified: Secondary | ICD-10-CM | POA: Diagnosis present

## 2022-08-04 DIAGNOSIS — Z888 Allergy status to other drugs, medicaments and biological substances status: Secondary | ICD-10-CM

## 2022-08-04 DIAGNOSIS — I4719 Other supraventricular tachycardia: Secondary | ICD-10-CM | POA: Diagnosis present

## 2022-08-04 DIAGNOSIS — R000 Tachycardia, unspecified: Secondary | ICD-10-CM | POA: Diagnosis not present

## 2022-08-04 DIAGNOSIS — Z801 Family history of malignant neoplasm of trachea, bronchus and lung: Secondary | ICD-10-CM

## 2022-08-04 DIAGNOSIS — E222 Syndrome of inappropriate secretion of antidiuretic hormone: Secondary | ICD-10-CM | POA: Diagnosis not present

## 2022-08-04 DIAGNOSIS — E860 Dehydration: Secondary | ICD-10-CM | POA: Diagnosis not present

## 2022-08-04 DIAGNOSIS — E876 Hypokalemia: Secondary | ICD-10-CM | POA: Diagnosis not present

## 2022-08-04 DIAGNOSIS — R531 Weakness: Secondary | ICD-10-CM | POA: Diagnosis not present

## 2022-08-04 DIAGNOSIS — D72818 Other decreased white blood cell count: Secondary | ICD-10-CM | POA: Diagnosis present

## 2022-08-04 DIAGNOSIS — R4189 Other symptoms and signs involving cognitive functions and awareness: Secondary | ICD-10-CM | POA: Diagnosis present

## 2022-08-04 DIAGNOSIS — J4541 Moderate persistent asthma with (acute) exacerbation: Principal | ICD-10-CM | POA: Diagnosis present

## 2022-08-04 DIAGNOSIS — M549 Dorsalgia, unspecified: Secondary | ICD-10-CM | POA: Diagnosis not present

## 2022-08-04 DIAGNOSIS — K219 Gastro-esophageal reflux disease without esophagitis: Secondary | ICD-10-CM | POA: Diagnosis present

## 2022-08-04 DIAGNOSIS — E039 Hypothyroidism, unspecified: Secondary | ICD-10-CM | POA: Diagnosis not present

## 2022-08-04 DIAGNOSIS — Z90710 Acquired absence of both cervix and uterus: Secondary | ICD-10-CM

## 2022-08-04 DIAGNOSIS — J441 Chronic obstructive pulmonary disease with (acute) exacerbation: Secondary | ICD-10-CM | POA: Diagnosis present

## 2022-08-04 DIAGNOSIS — Z806 Family history of leukemia: Secondary | ICD-10-CM

## 2022-08-04 DIAGNOSIS — Z7401 Bed confinement status: Secondary | ICD-10-CM | POA: Diagnosis not present

## 2022-08-04 DIAGNOSIS — Z7982 Long term (current) use of aspirin: Secondary | ICD-10-CM

## 2022-08-04 DIAGNOSIS — Z8042 Family history of malignant neoplasm of prostate: Secondary | ICD-10-CM

## 2022-08-04 DIAGNOSIS — Z825 Family history of asthma and other chronic lower respiratory diseases: Secondary | ICD-10-CM

## 2022-08-04 DIAGNOSIS — Z8049 Family history of malignant neoplasm of other genital organs: Secondary | ICD-10-CM

## 2022-08-04 DIAGNOSIS — Z887 Allergy status to serum and vaccine status: Secondary | ICD-10-CM

## 2022-08-04 DIAGNOSIS — M545 Low back pain, unspecified: Secondary | ICD-10-CM | POA: Diagnosis not present

## 2022-08-04 DIAGNOSIS — R0602 Shortness of breath: Secondary | ICD-10-CM | POA: Diagnosis not present

## 2022-08-04 DIAGNOSIS — Z7983 Long term (current) use of bisphosphonates: Secondary | ICD-10-CM | POA: Diagnosis not present

## 2022-08-04 DIAGNOSIS — I499 Cardiac arrhythmia, unspecified: Secondary | ICD-10-CM | POA: Diagnosis not present

## 2022-08-04 DIAGNOSIS — I2510 Atherosclerotic heart disease of native coronary artery without angina pectoris: Secondary | ICD-10-CM | POA: Diagnosis present

## 2022-08-04 DIAGNOSIS — Z853 Personal history of malignant neoplasm of breast: Secondary | ICD-10-CM

## 2022-08-04 DIAGNOSIS — Z91041 Radiographic dye allergy status: Secondary | ICD-10-CM

## 2022-08-04 DIAGNOSIS — Z91011 Allergy to milk products: Secondary | ICD-10-CM

## 2022-08-04 DIAGNOSIS — Z87892 Personal history of anaphylaxis: Secondary | ICD-10-CM | POA: Diagnosis not present

## 2022-08-04 DIAGNOSIS — Z8249 Family history of ischemic heart disease and other diseases of the circulatory system: Secondary | ICD-10-CM

## 2022-08-04 DIAGNOSIS — M797 Fibromyalgia: Secondary | ICD-10-CM | POA: Diagnosis present

## 2022-08-04 DIAGNOSIS — E063 Autoimmune thyroiditis: Secondary | ICD-10-CM | POA: Diagnosis present

## 2022-08-04 DIAGNOSIS — Z91040 Latex allergy status: Secondary | ICD-10-CM

## 2022-08-04 DIAGNOSIS — R059 Cough, unspecified: Secondary | ICD-10-CM | POA: Diagnosis not present

## 2022-08-04 DIAGNOSIS — Z8701 Personal history of pneumonia (recurrent): Secondary | ICD-10-CM

## 2022-08-04 DIAGNOSIS — Z8619 Personal history of other infectious and parasitic diseases: Secondary | ICD-10-CM

## 2022-08-04 DIAGNOSIS — Z79890 Hormone replacement therapy (postmenopausal): Secondary | ICD-10-CM

## 2022-08-04 DIAGNOSIS — Z800 Family history of malignant neoplasm of digestive organs: Secondary | ICD-10-CM

## 2022-08-04 DIAGNOSIS — Z1152 Encounter for screening for COVID-19: Secondary | ICD-10-CM | POA: Diagnosis not present

## 2022-08-04 DIAGNOSIS — I4891 Unspecified atrial fibrillation: Secondary | ICD-10-CM | POA: Diagnosis not present

## 2022-08-04 DIAGNOSIS — Z820 Family history of epilepsy and other diseases of the nervous system: Secondary | ICD-10-CM

## 2022-08-04 DIAGNOSIS — G9341 Metabolic encephalopathy: Secondary | ICD-10-CM | POA: Diagnosis not present

## 2022-08-04 DIAGNOSIS — K449 Diaphragmatic hernia without obstruction or gangrene: Secondary | ICD-10-CM | POA: Diagnosis not present

## 2022-08-04 DIAGNOSIS — Z841 Family history of disorders of kidney and ureter: Secondary | ICD-10-CM

## 2022-08-04 DIAGNOSIS — Z86006 Personal history of melanoma in-situ: Secondary | ICD-10-CM

## 2022-08-04 DIAGNOSIS — Z803 Family history of malignant neoplasm of breast: Secondary | ICD-10-CM

## 2022-08-04 LAB — I-STAT ARTERIAL BLOOD GAS, ED
Acid-base deficit: 6 mmol/L — ABNORMAL HIGH (ref 0.0–2.0)
Bicarbonate: 17.4 mmol/L — ABNORMAL LOW (ref 20.0–28.0)
Calcium, Ion: 1.13 mmol/L — ABNORMAL LOW (ref 1.15–1.40)
HCT: 44 % (ref 36.0–46.0)
Hemoglobin: 15 g/dL (ref 12.0–15.0)
O2 Saturation: 100 %
Potassium: 3.6 mmol/L (ref 3.5–5.1)
Sodium: 130 mmol/L — ABNORMAL LOW (ref 135–145)
TCO2: 18 mmol/L — ABNORMAL LOW (ref 22–32)
pCO2 arterial: 27.7 mmHg — ABNORMAL LOW (ref 32–48)
pH, Arterial: 7.407 (ref 7.35–7.45)
pO2, Arterial: 223 mmHg — ABNORMAL HIGH (ref 83–108)

## 2022-08-04 LAB — COMPREHENSIVE METABOLIC PANEL
ALT: 22 U/L (ref 0–44)
AST: 70 U/L — ABNORMAL HIGH (ref 15–41)
Albumin: 2.9 g/dL — ABNORMAL LOW (ref 3.5–5.0)
Alkaline Phosphatase: 48 U/L (ref 38–126)
Anion gap: 13 (ref 5–15)
BUN: 24 mg/dL — ABNORMAL HIGH (ref 8–23)
CO2: 19 mmol/L — ABNORMAL LOW (ref 22–32)
Calcium: 7.9 mg/dL — ABNORMAL LOW (ref 8.9–10.3)
Chloride: 94 mmol/L — ABNORMAL LOW (ref 98–111)
Creatinine, Ser: 1.28 mg/dL — ABNORMAL HIGH (ref 0.44–1.00)
GFR, Estimated: 42 mL/min — ABNORMAL LOW (ref >60)
Glucose, Bld: 92 mg/dL (ref 70–99)
Potassium: 3.6 mmol/L (ref 3.5–5.1)
Sodium: 126 mmol/L — ABNORMAL LOW (ref 135–145)
Total Bilirubin: 0.8 mg/dL (ref 0.3–1.2)
Total Protein: 5.6 g/dL — ABNORMAL LOW (ref 6.5–8.1)

## 2022-08-04 LAB — TSH: TSH: 5.405 u[IU]/mL — ABNORMAL HIGH (ref 0.350–4.500)

## 2022-08-04 LAB — BRAIN NATRIURETIC PEPTIDE: B Natriuretic Peptide: 16.6 pg/mL (ref 0.0–100.0)

## 2022-08-04 LAB — MAGNESIUM: Magnesium: 2.1 mg/dL (ref 1.7–2.4)

## 2022-08-04 LAB — LACTIC ACID, PLASMA
Lactic Acid, Venous: 1.8 mmol/L (ref 0.5–1.9)
Lactic Acid, Venous: 1 mmol/L (ref 0.5–1.9)

## 2022-08-04 LAB — CBC WITH DIFFERENTIAL/PLATELET
Abs Immature Granulocytes: 0.02 10*3/uL (ref 0.00–0.07)
Basophils Absolute: 0 10*3/uL (ref 0.0–0.1)
Basophils Relative: 1 %
Eosinophils Absolute: 0 10*3/uL (ref 0.0–0.5)
Eosinophils Relative: 0 %
HCT: 47 % — ABNORMAL HIGH (ref 36.0–46.0)
Hemoglobin: 15.5 g/dL — ABNORMAL HIGH (ref 12.0–15.0)
Immature Granulocytes: 1 %
Lymphocytes Relative: 17 %
Lymphs Abs: 0.5 10*3/uL — ABNORMAL LOW (ref 0.7–4.0)
MCH: 29.8 pg (ref 26.0–34.0)
MCHC: 33 g/dL (ref 30.0–36.0)
MCV: 90.2 fL (ref 80.0–100.0)
Monocytes Absolute: 0.3 10*3/uL (ref 0.1–1.0)
Monocytes Relative: 9 %
Neutro Abs: 2.2 10*3/uL (ref 1.7–7.7)
Neutrophils Relative %: 72 %
Platelets: 61 10*3/uL — ABNORMAL LOW (ref 150–400)
RBC: 5.21 MIL/uL — ABNORMAL HIGH (ref 3.87–5.11)
RDW: 13.2 % (ref 11.5–15.5)
WBC: 3 10*3/uL — ABNORMAL LOW (ref 4.0–10.5)
nRBC: 0 % (ref 0.0–0.2)

## 2022-08-04 LAB — T4, FREE: Free T4: 0.85 ng/dL (ref 0.61–1.12)

## 2022-08-04 LAB — HIV ANTIBODY (ROUTINE TESTING W REFLEX): HIV Screen 4th Generation wRfx: NONREACTIVE

## 2022-08-04 LAB — CK: Total CK: 291 U/L — ABNORMAL HIGH (ref 38–234)

## 2022-08-04 LAB — TROPONIN I (HIGH SENSITIVITY): Troponin I (High Sensitivity): 28 ng/L — ABNORMAL HIGH (ref <18)

## 2022-08-04 LAB — SARS CORONAVIRUS 2 BY RT PCR: SARS Coronavirus 2 by RT PCR: NEGATIVE

## 2022-08-04 LAB — MRSA NEXT GEN BY PCR, NASAL: MRSA by PCR Next Gen: NOT DETECTED

## 2022-08-04 MED ORDER — ENOXAPARIN SODIUM 40 MG/0.4ML IJ SOSY: 40.0000 mg | PREFILLED_SYRINGE | INTRAMUSCULAR | Status: DC

## 2022-08-04 MED ORDER — SODIUM CHLORIDE 0.9 % IV SOLN: INTRAVENOUS | Status: DC

## 2022-08-04 MED ORDER — FAMOTIDINE 20 MG PO TABS: 20.0000 mg | ORAL_TABLET | Freq: Every day | ORAL | Status: DC | PRN

## 2022-08-04 MED ORDER — PREDNISONE 20 MG PO TABS
40.0000 mg | ORAL_TABLET | Freq: Every day | ORAL | Status: DC
  Administered 2022-08-05 – 2022-08-06 (×2): 40 mg via ORAL
  Filled 2022-08-04 (×2): qty 2

## 2022-08-04 MED ORDER — ALBUTEROL SULFATE (2.5 MG/3ML) 0.083% IN NEBU
2.5000 mg | INHALATION_SOLUTION | Freq: Four times a day (QID) | RESPIRATORY_TRACT | Status: DC | PRN
  Filled 2022-08-04: qty 3

## 2022-08-04 MED ORDER — ALBUTEROL SULFATE HFA 108 (90 BASE) MCG/ACT IN AERS: 2.0000 | INHALATION_SPRAY | Freq: Four times a day (QID) | RESPIRATORY_TRACT | Status: DC | PRN

## 2022-08-04 MED ORDER — LEVOTHYROXINE SODIUM 25 MCG PO TABS
125.0000 ug | ORAL_TABLET | Freq: Every day | ORAL | Status: DC
  Administered 2022-08-05 – 2022-08-08 (×4): 125 ug via ORAL
  Filled 2022-08-04 (×4): qty 1

## 2022-08-04 MED ORDER — MOMETASONE FURO-FORMOTEROL FUM 200-5 MCG/ACT IN AERO
2.0000 | INHALATION_SPRAY | Freq: Two times a day (BID) | RESPIRATORY_TRACT | Status: DC
  Administered 2022-08-04 – 2022-08-05 (×2): 2 via RESPIRATORY_TRACT
  Filled 2022-08-04: qty 8.8

## 2022-08-04 MED ORDER — SODIUM CHLORIDE 0.9 % IV BOLUS
1000.0000 mL | Freq: Once | INTRAVENOUS | Status: AC
  Administered 2022-08-04: 1000 mL via INTRAVENOUS

## 2022-08-04 MED ORDER — ALBUTEROL SULFATE (2.5 MG/3ML) 0.083% IN NEBU
10.0000 mg | INHALATION_SOLUTION | Freq: Once | RESPIRATORY_TRACT | Status: AC
  Administered 2022-08-04: 10 mg via RESPIRATORY_TRACT
  Filled 2022-08-04: qty 12

## 2022-08-04 MED ORDER — METHYLPREDNISOLONE SODIUM SUCC 125 MG IJ SOLR
125.0000 mg | Freq: Once | INTRAMUSCULAR | Status: AC
  Administered 2022-08-04: 125 mg via INTRAVENOUS
  Filled 2022-08-04: qty 2

## 2022-08-04 NOTE — ED Notes (Signed)
ED TO INPATIENT HANDOFF REPORT  ED Nurse Name and Phone #: Ines Bloomer [phone]  S Name/Age/Gender Sonia [name] 82 y.o. female Room/Bed: 033C/033C  Code Status   Code Status: Full Code  Home/SNF/Other Home Patient oriented to: self, place, time, and situation Is this baseline? 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 Pt states that earlier today she had a fall. Denies head injury/LOC. C/o R rib cage pain. Pt was tachycardic in 110's and tachypneic in 20's with EMS and EtCO2 20.    Allergies Allergies  Allergen Reactions   Adenocard [Adenosine] Anaphylaxis   Contrast Media [Iodinated Contrast Media] Anaphylaxis   Influenza Virus Vaccine Anaphylaxis   Macrobid [Nitrofurantoin] Itching   Milk (Cow) Other (See Comments)    Stomach upset   Breztri Aerosphere [Budeson-Glycopyrrol-Formoterol] Other (See Comments)    Urinary retention.   Codeine Sulfate Nausea And Vomiting   Latex Other (See Comments)    Skin irritation    Level of Care/Admitting Diagnosis ED Disposition     ED Disposition  Admit   Condition  --   Comment  Hospital Area: [facility] [[id number]]  Level of Care: Progressive [102]  Admit to Progressive based on following criteria: RESPIRATORY PROBLEMS hypoxemic/hypercapnic respiratory failure that is responsive to NIPPV (BiPAP) or High Flow Nasal Cannula (6-80 lpm). Frequent assessment/intervention, no > Q2 hrs < Q4 hrs, to maintain oxygenation and pulmonary hygiene.  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   Vitamin D deficiency    Past Surgical History:  Procedure Laterality Date   ABDOMINAL HYSTERECTOMY     partial   APPENDECTOMY     BREAST BIOPSY Left    BREAST CYST EXCISION Left    BREAST EXCISIONAL BIOPSY Left 03/2015   BREAST LUMPECTOMY Right 08/2018   BREAST LUMPECTOMY WITH RADIOACTIVE SEED LOCALIZATION Left 04/07/2015   Procedure: BREAST LUMPECTOMY WITH RADIOACTIVE SEED LOCALIZATION;  Surgeon: Harriette Bouillon, MD;  Location: [facility];  Service: General;  Laterality: Left;   BREAST LUMPECTOMY WITH RADIOACTIVE SEED LOCALIZATION Right 08/13/2018   Procedure: RIGHT BREAST LUMPECTOMY WITH RADIOACTIVE SEED LOCALIZATION;  Surgeon: Emelia Loron, MD;  Location: [facility];  Service: General;  Laterality: Right;   COLONOSCOPY     CORONARY STENT INTERVENTION N/A 07/15/2016   Procedure: Coronary Stent Intervention;  Surgeon: Tonny Bollman, MD;  Location: [facility] INVASIVE CV LAB;  Service: Cardiovascular;  Laterality: N/A;   ESOPHAGOGASTRODUODENOSCOPY (EGD) WITH PROPOFOL Left 07/25/2016   Procedure: ESOPHAGOGASTRODUODENOSCOPY (EGD) WITH PROPOFOL;  Surgeon: Kerin Salen, MD;   Location: [facility];  Service: Gastroenterology;  Laterality: Left;   KNEE ARTHROSCOPY Right 12/07/2003   LAPAROSCOPIC SALPINGO OOPHERECTOMY Right 09/08/2003   LEFT HEART CATH N/A 07/15/2016   Procedure: Left Heart Cath;  Surgeon: Tonny Bollman, MD;  Location: [facility] INVASIVE CV LAB;  Service: Cardiovascular;  Laterality: N/A;   LEFT HEART CATH AND CORONARY ANGIOGRAPHY N/A 04/14/2019   Procedure: LEFT HEART CATH AND CORONARY ANGIOGRAPHY;  Surgeon: Kathleene Hazel, MD;  Location: [facility] INVASIVE CV LAB;  Service: Cardiovascular;  Laterality: N/A;   LYSIS OF ADHESION  09/08/2003   NASAL SINUS SURGERY  age 59   RECTAL POLYPECTOMY  10/02/2006     A IV Location/Drains/Wounds Patient Lines/Drains/Airways Status     Active Line/Drains/Airways     Name Placement date Placement time Site Days   Peripheral IV 08/04/22 20 G Left Antecubital 08/04/22  1634  Antecubital  less than 1            Intake/Output Last 24 hours  Intake/Output Summary (Last 24 hours) at 08/04/2022 2027 Last data filed at 08/04/2022 1946 Gross per 24 hour  Intake 1000 ml  Output --  Net 1000 ml    Labs/Imaging Results for orders placed or performed during the hospital encounter of 08/04/22 (from the past 48 hour(s))  CBC with Differential     Status: Abnormal   Collection Time: 08/04/22  5:11 PM  Result Value Ref Range   WBC 3.0 (L) 4.0 - 10.5 K/uL   RBC 5.21 (H) 3.87 - 5.11 MIL/uL   Hemoglobin 15.5 (H) 12.0 - 15.0 g/dL    Comment: REPEATED TO VERIFY   HCT 47.0 (H) 36.0 - 46.0 %   MCV 90.2 80.0 - 100.0 fL   MCH 29.8 26.0 - 34.0 pg   MCHC 33.0 30.0 - 36.0 g/dL   RDW 21.3 08.6 - 57.8 %   Platelets 61 (L) 150 - 400 K/uL    Comment: Immature Platelet Fraction may be clinically indicated, consider ordering this additional test [id number] REPEATED TO VERIFY PLATELET COUNT CONFIRMED BY SMEAR    nRBC 0.0 0.0 - 0.2 %   Neutrophils Relative % 72 %   Neutro Abs 2.2 1.7 - 7.7 K/uL   Lymphocytes Relative  17 %   Lymphs Abs 0.5 (L) 0.7 - 4.0 K/uL   Monocytes Relative 9 %   Monocytes Absolute 0.3 0.1 - 1.0 K/uL   Eosinophils Relative 0 %   Eosinophils Absolute 0.0 0.0 - 0.5 K/uL   Basophils Relative 1 %   Basophils Absolute 0.0 0.0 - 0.1 K/uL   Immature Granulocytes 1 %   Abs Immature Granulocytes 0.02 0.00 - 0.07 K/uL    Comment: Performed at [facility], [address] [place], [place] [address]  Comprehensive metabolic panel     Status: Abnormal   Collection Time: 08/04/22  5:11 PM  Result Value Ref Range   Sodium 126 (L) 135 - 145 mmol/L   Potassium 3.6 3.5 - 5.1 mmol/L   Chloride 94 (L) 98 - 111 mmol/L   CO2 19 (L) 22 - 32 mmol/L  Glucose, Bld 92 70 - 99 mg/dL    Comment: Glucose reference range applies only to samples taken after fasting for at least 8 hours.   BUN 24 (H) 8 - 23 mg/dL   Creatinine, Ser 1.61 (H) 0.44 - 1.00 mg/dL   Calcium 7.9 (L) 8.9 - 10.3 mg/dL   Total Protein 5.6 (L) 6.5 - 8.1 g/dL   Albumin 2.9 (L) 3.5 - 5.0 g/dL   AST 70 (H) 15 - 41 U/L   ALT 22 0 - 44 U/L   Alkaline Phosphatase 48 38 - 126 U/L   Total Bilirubin 0.8 0.3 - 1.2 mg/dL   GFR, Estimated 42 (L) >60 mL/min    Comment: (NOTE) Calculated using the CKD-EPI Creatinine Equation (2021)    Anion gap 13 5 - 15    Comment: Performed at [facility], [address] [place], [place] [address]  Magnesium     Status: None   Collection Time: 08/04/22  5:11 PM  Result Value Ref Range   Magnesium 2.1 1.7 - 2.4 mg/dL    Comment: Performed at [facility], [address] [place], [place] [address]  Troponin I (High Sensitivity)     Status: Abnormal   Collection Time: 08/04/22  5:11 PM  Result Value Ref Range   Troponin I (High Sensitivity) 28 (H) <18 ng/L    Comment: (NOTE) Elevated high sensitivity troponin I (hsTnI) values and significant  changes across serial measurements may suggest ACS but many other  chronic and acute conditions are known to elevate hsTnI results.  Refer  to the "Links" section for chest pain algorithms and additional  guidance. Performed at [facility], [address], [place], [place] [address]   Brain natriuretic peptide     Status: None   Collection Time: 08/04/22  5:11 PM  Result Value Ref Range   B Natriuretic Peptide 16.6 0.0 - 100.0 pg/mL    Comment: Performed at [facility], [address], [place], [place] [address]  TSH     Status: Abnormal   Collection Time: 08/04/22  5:11 PM  Result Value Ref Range   TSH 5.405 (H) 0.350 - 4.500 uIU/mL    Comment: Performed by a 3rd Generation assay with a functional sensitivity of <=0.01 uIU/mL. Performed at [facility], [address], [place], [place] [address]   CK     Status: Abnormal   Collection Time: 08/04/22  5:11 PM  Result Value Ref Range   Total CK 291 (H) 38 - 234 U/L    Comment: Performed at [facility], [address], [place], [place] [address]  Lactic acid, plasma     Status: None   Collection Time: 08/04/22  5:11 PM  Result Value Ref Range   Lactic Acid, Venous 1.8 0.5 - 1.9 mmol/L    Comment: Performed at [facility], [address] [place], [place] [address]  I-Stat arterial blood gas, ED     Status: Abnormal   Collection Time: 08/04/22  5:24 PM  Result Value Ref Range   pH, Arterial 7.407 7.35 - 7.45   pCO2 arterial 27.7 (L) 32 - 48 mmHg   pO2, Arterial 223 (H) 83 - 108 mmHg   Bicarbonate 17.4 (L) 20.0 - 28.0 mmol/L   TCO2 18 (L) 22 - 32 mmol/L   O2 Saturation 100 %   Acid-base deficit 6.0 (H) 0.0 - 2.0 mmol/L   Sodium 130 (L)  135 - 145 mmol/L   Potassium 3.6 3.5 - 5.1 mmol/L   Calcium, Ion 1.13 (L) 1.15 - 1.40 mmol/L   HCT 44.0 36.0 - 46.0 %   Hemoglobin 15.0 12.0 - 15.0 g/dL   Sample type ARTERIAL    DG Chest Port 1 View  Result Date: 08/04/2022 CLINICAL DATA:  Cough, shortness of breath, weakness EXAM: PORTABLE CHEST 1 VIEW COMPARISON:  04/08/2022 FINDINGS: Large hiatal hernia. Heart and mediastinal contours within  normal limits. Aortic atherosclerosis. No confluent opacities or effusions. No acute bony abnormality. IMPRESSION: No active cardiopulmonary disease. Stable large hiatal hernia. Electronically Signed   By: Charlett Nose M.D.   On: 08/04/2022 17:32    Pending Labs Unresulted Labs (From admission, onward)     Start     Ordered   08/05/22 0500  CBC  Tomorrow morning,   R        08/04/22 2012   08/04/22 2300  Basic metabolic panel  Now then every 6 hours,   R (with TIMED occurrences)      08/04/22 2011   08/04/22 1934  SARS Coronavirus 2 by RT PCR (hospital order, performed in [facility] hospital lab) *cepheid single result test* Anterior Nasal Swab  (Tier 2 - SARS Coronavirus 2 by RT PCR (hospital order, performed in [facility] hospital lab) *cepheid single result test*)  Once,   URGENT        08/04/22 1933   08/04/22 1934  HIV Antibody (routine testing w rflx)  (HIV Antibody (Routine testing w reflex) panel)  Once,   R        08/04/22 1935   08/04/22 1933  Respiratory (~20 pathogens) panel by PCR  (Respiratory panel by PCR (~20 pathogens, ~24 hr TAT)  w precautions)  Once,   URGENT        08/04/22 1933   08/04/22 1932  Sodium, urine, random  Once,   URGENT        08/04/22 1931   08/04/22 1932  Osmolality, urine  Once,   URGENT        08/04/22 1931   08/04/22 1723  Blood gas, arterial  Once,   R        08/04/22 1723   08/04/22 1651  Lactic acid, plasma  Now then every 2 hours,   R (with STAT occurrences)      08/04/22 1650   08/04/22 1650  Urinalysis, Routine w reflex microscopic -Urine, Clean Catch  Once,   URGENT       Question:  Specimen Source  Answer:  Urine, Clean Catch   08/04/22 1649            Vitals/Pain Today's Vitals   08/04/22 1642 08/04/22 1730 08/04/22 1947  BP: (!) 132/57 (!) 149/57 (!) 101/58  Pulse: (!) 105 (!) 103 (!) 105  Resp: (!) 24 (!) 27 (!) 21  Temp: 99.2 F (37.3 C)  98.8 F (37.1 C)  TempSrc: Oral  Oral  SpO2: 98% 100% 95%    Isolation  Precautions Droplet precaution  Medications Medications  0.9 %  sodium chloride infusion (has no administration in time range)  predniSONE (DELTASONE) tablet 40 mg (has no administration in time range)  albuterol (PROVENTIL) (2.5 MG/3ML) 0.083% nebulizer solution 2.5 mg (has no administration in time range)  mometasone-formoterol (DULERA) 200-5 MCG/ACT inhaler 2 puff (has no administration in time range)  methylPREDNISolone sodium succinate (SOLU-MEDROL) 125 mg/2 mL injection 125 mg (125 mg Intravenous Given 08/04/22 1719)  albuterol (  PROVENTIL) (2.5 MG/3ML) 0.083% nebulizer solution 10 mg (10 mg Nebulization Given 08/04/22 1706)  sodium chloride 0.9 % bolus 1,000 mL (0 mLs Intravenous Stopped 08/04/22 1946)    Mobility walks with device     Focused Assessments See chart   R Recommendations: See Admitting Provider Note  Report given to:   Additional Notes: see chart

## 2022-08-04 NOTE — Assessment & Plan Note (Addendum)
DDx regarding cause includes: 1) Dehydration from poor PO intake over past few days 2) adrenal insufficiency (pt takes chronic prednisone low dose for asthma). 3) SIADH  Getting solumedrol in ED, will then be on prednisone 40mg  daily for her asthma exacerbation anyhow (covering adrenal insufficiency) Normal saline at 100cc/hr tonight BMP q6h Urine sodium and urine osm

## 2022-08-04 NOTE — H&P (Signed)
History and Physical    Patient: Sonia [name] ZOX:[id number] DOB: [birth year] DOA: 08/04/2022 DOS: the patient was seen and examined on 08/04/2022 PCP: Mila Palmer, MD  Patient coming from: Home  Chief Complaint:  Chief Complaint  Patient presents with   Weakness   HPI: Sonia [name] is a 82 y.o. female with medical history significant of Asthma/COPD, steroid dependent, hypothyroidism.  Pt has been on steroids recently as high as 100 mg/day but currently tapered down to 2 mg/day.  She presents to the hospital with generalized weakness which has been progressive over the last week or longer.  Additionally she seems to be having a mild asthma exacerbation with wheezing.  She reports that she does not want to eat anything but has still been drinking water, she is extremely thirsty. Today she slid off the edge of her bed when she was try to get out of bed and went to the ground. She did not injure herself during this episode but states that she was too weak to get off the ground which is what prompted the call to 911 for paramedic transport. She reports that the only reason that she came in is because her family wanted her to get evaluated but she did not want to come herself. She denies urinary symptoms, diarrhea, fevers and she frankly has no chest pain or abdominal pain. She denies focality to the weakness but more of a generalized weakness.     Review of Systems: As mentioned in the history of present illness. All other systems reviewed and are negative. Past Medical History:  Diagnosis Date   Arthritis    Asthma    daily and prn inhalers; states good control currently   Breast cancer (HCC)    right   CAD S/P percutaneous coronary angioplasty 07/15/2016   COPD (chronic obstructive pulmonary disease) (HCC)    Dyspnea    occ with exertion   Fibromyalgia    GERD (gastroesophageal reflux disease)    no current med.   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   Vitamin D deficiency    Past Surgical History:  Procedure Laterality Date   ABDOMINAL HYSTERECTOMY     partial   APPENDECTOMY     BREAST BIOPSY Left    BREAST CYST EXCISION Left    BREAST EXCISIONAL BIOPSY Left 03/2015   BREAST LUMPECTOMY Right 08/2018   BREAST LUMPECTOMY WITH RADIOACTIVE SEED LOCALIZATION Left 04/07/2015   Procedure: BREAST LUMPECTOMY WITH RADIOACTIVE SEED LOCALIZATION;  Surgeon: Harriette Bouillon, MD;  Location: [facility];  Service: General;  Laterality: Left;   BREAST LUMPECTOMY WITH RADIOACTIVE SEED LOCALIZATION Right 08/13/2018   Procedure: RIGHT BREAST LUMPECTOMY WITH RADIOACTIVE SEED LOCALIZATION;  Surgeon: Emelia Loron, MD;  Location: [facility];  Service: General;  Laterality: Right;   COLONOSCOPY     CORONARY STENT INTERVENTION N/A 07/15/2016   Procedure: Coronary Stent Intervention;  Surgeon: Tonny Bollman, MD;  Location: [facility] INVASIVE CV LAB;  Service: Cardiovascular;  Laterality: N/A;   ESOPHAGOGASTRODUODENOSCOPY (EGD) WITH PROPOFOL Left 07/25/2016   Procedure: ESOPHAGOGASTRODUODENOSCOPY (EGD) WITH PROPOFOL;  Surgeon: Kerin Salen, MD;  Location: [facility] ENDOSCOPY;  Service: Gastroenterology;  Laterality: Left;   KNEE ARTHROSCOPY Right 12/07/2003   LAPAROSCOPIC SALPINGO OOPHERECTOMY Right 09/08/2003   LEFT HEART CATH N/A 07/15/2016   Procedure: Left Heart Cath;  Surgeon: Tonny Bollman, MD;  Location: [facility] INVASIVE CV LAB;  Service: Cardiovascular;  Laterality: N/A;   LEFT HEART CATH AND CORONARY ANGIOGRAPHY N/A 04/14/2019   Procedure: LEFT HEART CATH AND CORONARY ANGIOGRAPHY;  Surgeon: Kathleene Hazel, MD;  Location: [facility] INVASIVE CV LAB;  Service: Cardiovascular;  Laterality: N/A;   LYSIS OF ADHESION  09/08/2003   NASAL SINUS SURGERY  age 62   RECTAL POLYPECTOMY  10/02/2006   Social History:  reports that she has never smoked. She has been exposed to tobacco smoke. She has never used smokeless tobacco. She reports that she does not currently use alcohol. She reports that she does not use drugs.  Allergies  Allergen Reactions   Adenosine Anaphylaxis   Contrast Media [Iodinated Contrast Media] Anaphylaxis   Influenza Virus Vaccine Anaphylaxis   Nitrofurantoin Itching   Milk (Cow)     Other reaction(s): Other Other Reaction: Other reaction   Breztri Aerosphere [Budeson-Glycopyrrol-Formoterol] Other (See Comments)    Urinary retention.   Codeine Sulfate Nausea And Vomiting   Latex Other (See Comments)    Skin irritation    Family History  Problem Relation Age of Onset   Asthma Mother    Heart disease Mother    Asthma Sister    Heart disease Sister    Breast cancer Sister 53   Heart disease Father    Kidney disease Father    Prostate cancer Brother        dx. 50s   Heart attack Brother 36   Alzheimer's disease Brother 37   Breast cancer Maternal Aunt 52   Stomach cancer Maternal Grandfather 22   Asthma Sister    Memory loss Sister    Heart attack Son 52   Spinal muscular atrophy Grandchild        type II   Cancer Other 51       niece dx. ca of salivary gland    Breast cancer Other 50       niece; negative genetic testing 10 years ago   Melanoma Other    Alzheimer's disease Maternal Aunt    Cervical cancer Cousin        maternal 1st cousin dx. at 91   Breast cancer Cousin 67       maternal 1st cousin   Lung cancer Cousin 22       maternal 1st cousin; smoker   Heart disease Cousin    Leukemia Cousin 14       paternal 1st cousin   Breast cancer Other        paternal great grandmother (PGF's mother) dx. late 6s-early 28s   Coronary artery disease  Other     Prior to Admission medications   Medication Sig Start Date End Date Taking? Authorizing Provider  levothyroxine (SYNTHROID) 125 MCG tablet Take 125 mcg by mouth daily before breakfast.   Yes [provider]  nitroGLYCERIN (NITROSTAT) 0.4 MG SL tablet Place 0.4 mg under the tongue every 5 (five) minutes x 3 doses as needed for chest pain.   Yes [provider]  predniSONE (DELTASONE) 1 MG tablet Take 2 mg by mouth daily.   Yes [provider]  SYMBICORT 160-4.5 MCG/ACT inhaler Inhale 2 puffs into the lungs in the  morning and at bedtime. 12/22/21  Yes Nyoka Cowden, MD  albuterol (PROVENTIL) (2.5 MG/3ML) 0.083% nebulizer solution Take 3 mLs (2.5 mg total) by nebulization every 6 (six) hours as needed for wheezing or shortness of breath. Patient not taking: Reported on 08/04/2022 05/29/21   Nyoka Cowden, MD  albuterol (VENTOLIN HFA) 108 (90 Base) MCG/ACT inhaler Inhale 2 puffs into the lungs every 6 (six) hours as needed for wheezing or shortness of breath. May inhale 2 puffs every 4 hours if needed Patient taking differently: Inhale 2 puffs into the lungs every 4 (four) hours as needed for wheezing or shortness of breath. 03/16/22   Almon Hercules, MD  aspirin EC 81 MG tablet Take 1 tablet (81 mg total) by mouth daily. Swallow whole. Patient not taking: Reported on 08/04/2022 03/17/22   Almon Hercules, MD  ezetimibe (ZETIA) 10 MG tablet Take 1 tablet (10 mg total) by mouth daily. Patient not taking: Reported on 08/04/2022 04/13/22   Kathleene Hazel, MD  pantoprazole (PROTONIX) 40 MG tablet Take 1 tablet (40 mg total) by mouth daily. Take 30-60 min before first meal of the day Patient not taking: Reported on 08/04/2022 03/16/22   Almon Hercules, MD  rosuvastatin (CRESTOR) 5 MG tablet Take 1 tablet (5 mg total) by mouth 3 (three) times a week. Patient not taking: Reported on 08/04/2022 04/13/22   Kathleene Hazel, MD    Physical Exam: Vitals:   08/04/22 1642  08/04/22 1730 08/04/22 1947  BP: (!) 132/57 (!) 149/57 (!) 101/58  Pulse: (!) 105 (!) 103 (!) 105  Resp: (!) 24 (!) 27 (!) 21  Temp: 99.2 F (37.3 C)  98.8 F (37.1 C)  TempSrc: Oral  Oral  SpO2: 98% 100% 95%   Constitutional: NAD, calm, comfortable Respiratory: Some wheezing, not really in respiratory distress  Cardiovascular: Mildly tachycardic Abdomen: no tenderness, no masses palpated. No hepatosplenomegaly. Bowel sounds positive.  Neurologic: CN 2-12 grossly intact. Sensation intact, DTR normal. Strength 5/5 in all 4.  Psychiatric: Normal judgment and insight. Alert and oriented x 3. Normal mood.   Data Reviewed:     Labs on Admission: I have personally reviewed following labs and imaging studies  CBC: Recent Labs  Lab 08/04/22 1711 08/04/22 1724  WBC 3.0*  --   NEUTROABS 2.2  --   HGB 15.5* 15.0  HCT 47.0* 44.0  MCV 90.2  --   PLT 61*  --    Basic Metabolic Panel: Recent Labs  Lab 08/04/22 1711 08/04/22 1724  NA 126* 130*  K 3.6 3.6  CL 94*  --   CO2 19*  --   GLUCOSE 92  --   BUN 24*  --   CREATININE 1.28*  --   CALCIUM 7.9*  --   MG 2.1  --     Liver Function Tests: Recent Labs  Lab 08/04/22 1711  AST 70*  ALT 22  ALKPHOS 48  BILITOT 0.8  PROT 5.6*  ALBUMIN 2.9*    Cardiac Enzymes: Recent Labs  Lab 08/04/22 1711  CKTOTAL 291*    Thyroid Function Tests: Recent Labs    08/04/22 1711  TSH 5.405*    Urine analysis:    Component Value Date/Time   COLORURINE YELLOW 08/14/2021 1604   APPEARANCEUR CLEAR 08/14/2021 1604   LABSPEC 1.020 08/14/2021 1604   PHURINE 5.0 08/14/2021 1604   GLUCOSEU NEGATIVE 08/14/2021 1604   GLUCOSEU NEGATIVE 11/28/2010 1422   HGBUR NEGATIVE 08/14/2021 1604  BILIRUBINUR NEGATIVE 08/14/2021 1604   KETONESUR NEGATIVE 08/14/2021 1604   PROTEINUR NEGATIVE 08/14/2021 1604   UROBILINOGEN 0.2 11/28/2010 1422   NITRITE NEGATIVE 08/14/2021 1604   LEUKOCYTESUR NEGATIVE 08/14/2021 1604    Radiological Exams  on Admission: DG Chest Port 1 View  Result Date: 08/04/2022 CLINICAL DATA:  Cough, shortness of breath, weakness EXAM: PORTABLE CHEST 1 VIEW COMPARISON:  04/08/2022 FINDINGS: Large hiatal hernia. Heart and mediastinal contours within normal limits. Aortic atherosclerosis. No confluent opacities or effusions. No acute bony abnormality. IMPRESSION: No active cardiopulmonary disease. Stable large hiatal hernia. Electronically Signed   By: Charlett Nose M.D.   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 3) SIADH  Getting solumedrol in ED, will then be on prednisone 40mg  daily for her asthma exacerbation anyhow (covering adrenal insufficiency) Normal saline at 100cc/hr tonight BMP q6h Urine sodium and urine osm  Thrombocytopenia (HCC) And mild leukopenia. Cause unclear doesn't seem to have ever had this before? Repeat CBC in AM  Hypothyroidism Cont synthroid TSH slightly elevated, check T4, may need slight increase in synthroid dose.      Advance Care Planning:   Code Status: Full Code  Consults: None  Family Communication: No family in room  Severity of Illness: The appropriate patient status for this patient is OBSERVATION. Observation status is judged to be reasonable and necessary in order to provide the required intensity of service to ensure the patient's safety. The patient's presenting symptoms, physical exam findings, and initial radiographic and laboratory data in the context of their medical condition is felt to place them at decreased risk for further clinical deterioration. Furthermore,  it is anticipated that the patient will be medically stable for discharge from the hospital within 2 midnights of admission.   Author: Hillary Bow., DO 08/04/2022 8:17 PM  For on call review [email or web address].

## 2022-08-04 NOTE — ED Notes (Signed)
Granddaughter Ana Schmuhl [phone] would like an update asap

## 2022-08-04 NOTE — ED Triage Notes (Signed)
Pt c/o increasing weakness for one week. Pt states that earlier today she had a fall. Denies head injury/LOC. C/o R rib cage pain. Pt was tachycardic in 110's and tachypneic in 20's with EMS and EtCO2 20.

## 2022-08-04 NOTE — Assessment & Plan Note (Addendum)
COPD pathway Tele monitor Adult wheeze protocol PRN albuterol Scheduled Dulera Got solumedrol in ED Prednisone 40 daily. Check COVID-19 and RVP

## 2022-08-04 NOTE — Assessment & Plan Note (Signed)
Cont synthroid TSH slightly elevated, check T4, may need slight increase in synthroid dose.

## 2022-08-04 NOTE — ED Provider Notes (Signed)
[place] EMERGENCY DEPARTMENT AT [facility] Provider Note   CSN: [id number] Arrival date & time: 08/04/22  1633     History  Chief Complaint  Patient presents with   Weakness    Sonia [name] is a 82 y.o. female.   Weakness  This patient is an 82 year old female with a history of asthma and COPD, she is not on oxygen at baseline, she does use albuterol treatments and has been on steroids recently as high as 100 mg/day but currently tapered down to 2 mg/day.  She also has hypothyroidism on Synthroid and hypercholesterolemia on a statin.  She presents to the hospital with generalized weakness which has been progressive over the last week or longer.  She reports that she does not want to eat anything but has still been drinking water, she is extremely thirsty.  Today she slid off the edge of her bed when she was try to get out of bed and went to the ground.  She did not injure herself during this episode but states that she was too weak to get off the ground which is what prompted the call to 911 for paramedic transport.  She reports that the only reason that she came in is because her family wanted her to get evaluated but she did not want to come herself.  She denies urinary symptoms, diarrhea, fevers and she frankly has no chest pain or abdominal pain.  She denies focality to the weakness but more of a generalized weakness.    Home Medications Prior to Admission medications   Medication Sig Start Date End Date Taking? Authorizing Provider  albuterol (PROVENTIL) (2.5 MG/3ML) 0.083% nebulizer solution Take 3 mLs (2.5 mg total) by nebulization every 6 (six) hours as needed for wheezing or shortness of breath. Patient taking differently: Take 2.5 mg by nebulization every 6 (six) hours as needed for wheezing or shortness of breath. Use 1 nebulizer (2.5 mg) (scheduled) in the morning & may use every 6 hours as needed for wheezing/shortness of breath. 05/29/21   Nyoka Cowden, MD  albuterol (VENTOLIN HFA) 108 (90 Base) MCG/ACT inhaler Inhale 2 puffs into the lungs every 6 (six) hours as needed for wheezing or shortness of breath. May inhale 2 puffs every 4 hours if needed 03/16/22   Almon Hercules, MD  alendronate (FOSAMAX) 70 MG tablet Take 70 mg by mouth once a week.    [provider]  aspirin EC 81 MG tablet Take 1 tablet (81 mg total) by mouth daily. Swallow whole. 03/17/22   Almon Hercules, MD  calcium carbonate (TUMS - DOSED IN MG ELEMENTAL CALCIUM) 500 MG chewable tablet Chew 500 mg by mouth daily as needed for indigestion or heartburn.    [provider]  Cholecalciferol 25 MCG (1000 UT) capsule Take 1,500 Units by mouth 3 (three) times a week.    [provider]  Cyanocobalamin (B-12) 5000 MCG CAPS Place 5,000 mcg under the tongue 3 (three) times a week.    [provider]  ezetimibe (ZETIA) 10 MG tablet Take 1 tablet (10 mg total) by mouth daily. 04/13/22   Kathleene Hazel, MD  levothyroxine (SYNTHROID) 125 MCG tablet Take 125 mcg by mouth daily before breakfast.    [provider]  nitroGLYCERIN (NITROSTAT) 0.4 MG SL tablet Place 0.4 mg under the tongue every 5 (five) minutes x 3 doses as needed for chest pain.    [provider]  pantoprazole (PROTONIX) 40 MG  tablet Take 1 tablet (40 mg total) by mouth daily. Take 30-60 min before first meal of the day 03/16/22   Candelaria Stagers T, MD  predniSONE (DELTASONE) 1 MG tablet Take 1 mg by mouth daily.    [provider]  predniSONE (DELTASONE) 5 MG tablet Take 5 mg by mouth daily.    [provider]  rosuvastatin (CRESTOR) 5 MG tablet Take 1 tablet (5 mg total) by mouth 3 (three) times a week. 04/13/22   Kathleene Hazel, MD  SYMBICORT 160-4.5 MCG/ACT inhaler Inhale 2 puffs into the lungs in the morning and at bedtime. 12/22/21   Nyoka Cowden, MD      Allergies    Adenosine, Contrast media [iodinated contrast media], Influenza  virus vaccine, Nitrofurantoin, Milk (cow), Breztri aerosphere [budeson-glycopyrrol-formoterol], Codeine sulfate, and Latex    Review of Systems   Review of Systems  Neurological:  Positive for weakness.  All other systems reviewed and are negative.   Physical Exam Updated Vital Signs BP (!) 149/57   Pulse (!) 103   Temp 99.2 F (37.3 C) (Oral)   Resp (!) 27   SpO2 100%  Physical Exam Vitals and nursing note reviewed.  Constitutional:      General: She is not in acute distress.    Appearance: She is well-developed.  HENT:     Head: Normocephalic and atraumatic.     Mouth/Throat:     Mouth: Mucous membranes are dry.     Pharynx: No oropharyngeal exudate.     Comments: Dry mucous membranes Eyes:     General: No scleral icterus.       Right eye: No discharge.        Left eye: No discharge.     Conjunctiva/sclera: Conjunctivae normal.     Pupils: Pupils are equal, round, and reactive to light.  Neck:     Thyroid: No thyromegaly.     Vascular: No JVD.  Cardiovascular:     Rate and Rhythm: Regular rhythm. Tachycardia present.     Heart sounds: Normal heart sounds. No murmur heard.    No friction rub. No gallop.     Comments: Tachycardic to 110 with normal pulses at the radial arteries no JVD or peripheral edema Pulmonary:     Effort: Respiratory distress present.     Breath sounds: Wheezing present. No rales.     Comments: Tachypneic with prolonged expiratory phase, speaks in shortened sentences, no accessory muscle use, no rales, decreased air movement bilaterally symmetrically Abdominal:     General: Bowel sounds are normal. There is no distension.     Palpations: Abdomen is soft. There is no mass.     Tenderness: There is no abdominal tenderness.  Musculoskeletal:        General: No tenderness. Normal range of motion.     Cervical back: Normal range of motion and neck supple.     Right lower leg: No edema.     Left lower leg: No edema.  Lymphadenopathy:      Cervical: No cervical adenopathy.  Skin:    General: Skin is warm and dry.     Findings: No erythema or rash.  Neurological:     Mental Status: She is alert.     Coordination: Coordination normal.     Comments: 5 out of 5 strength in all 4 extremities, cranial nerves III through XII are normal  Psychiatric:        Behavior: Behavior normal.     ED  Results / Procedures / Treatments   Labs (all labs ordered are listed, but only abnormal results are displayed) Labs Reviewed  CBC WITH DIFFERENTIAL/PLATELET - Abnormal; Notable for the following components:      Result Value   WBC 3.0 (*)    RBC 5.21 (*)    Hemoglobin 15.5 (*)    HCT 47.0 (*)    Platelets 61 (*)    Lymphs Abs 0.5 (*)    All other components within normal limits  COMPREHENSIVE METABOLIC PANEL - Abnormal; Notable for the following components:   Sodium 126 (*)    Chloride 94 (*)    CO2 19 (*)    BUN 24 (*)    Creatinine, Ser 1.28 (*)    Calcium 7.9 (*)    Total Protein 5.6 (*)    Albumin 2.9 (*)    AST 70 (*)    GFR, Estimated 42 (*)    All other components within normal limits  TSH - Abnormal; Notable for the following components:   TSH 5.405 (*)    All other components within normal limits  CK - Abnormal; Notable for the following components:   Total CK 291 (*)    All other components within normal limits  I-STAT ARTERIAL BLOOD GAS, ED - Abnormal; Notable for the following components:   pCO2 arterial 27.7 (*)    pO2, Arterial 223 (*)    Bicarbonate 17.4 (*)    TCO2 18 (*)    Acid-base deficit 6.0 (*)    Sodium 130 (*)    Calcium, Ion 1.13 (*)    All other components within normal limits  TROPONIN I (HIGH SENSITIVITY) - Abnormal; Notable for the following components:   Troponin I (High Sensitivity) 28 (*)    All other components within normal limits  MAGNESIUM  LACTIC ACID, PLASMA  BRAIN NATRIURETIC PEPTIDE  URINALYSIS, ROUTINE W REFLEX MICROSCOPIC  LACTIC ACID, PLASMA  BLOOD GAS, ARTERIAL     EKG EKG Interpretation  Date/Time:  Saturday Aug 04 2022 16:44:30 EDT Ventricular Rate:  107 PR Interval:  138 QRS Duration: 92 QT Interval:  329 QTC Calculation: 439 R Axis:   86 Text Interpretation: Sinus tachycardia Ventricular premature complex Consider right atrial enlargement Borderline right axis deviation Confirmed by Eber Hong ([id number]) on 08/04/2022 4:57:14 PM  Radiology DG Chest Port 1 View  Result Date: 08/04/2022 CLINICAL DATA:  Cough, shortness of breath, weakness EXAM: PORTABLE CHEST 1 VIEW COMPARISON:  04/08/2022 FINDINGS: Large hiatal hernia. Heart and mediastinal contours within normal limits. Aortic atherosclerosis. No confluent opacities or effusions. No acute bony abnormality. IMPRESSION: No active cardiopulmonary disease. Stable large hiatal hernia. Electronically Signed   By: Charlett Nose M.D.   On: 08/04/2022 17:32    Procedures Procedures    Medications Ordered in ED Medications  0.9 %  sodium chloride infusion (has no administration in time range)  methylPREDNISolone sodium succinate (SOLU-MEDROL) 125 mg/2 mL injection 125 mg (125 mg Intravenous Given 08/04/22 1719)  albuterol (PROVENTIL) (2.5 MG/3ML) 0.083% nebulizer solution 10 mg (10 mg Nebulization Given 08/04/22 1706)  sodium chloride 0.9 % bolus 1,000 mL (1,000 mLs Intravenous New Bag/Given 08/04/22 1717)    ED Course/ Medical Decision Making/ A&P                             Medical Decision Making Amount and/or Complexity of Data Reviewed Labs: ordered. Radiology: ordered.  Risk Prescription drug management. Decision regarding hospitalization.  The cause of this patient's weakness is not exactly clear however she has not been eating very well and I suspect there is a significant element of malnourishment.  She is probably dehydrated based on her tachycardia and her mucous membranes, she is not hypotensive though reportedly she had been hypotensive prehospital around 90 systolic, she was  given a small amount of IV fluid but currently she has 130 systolic.  Temperature is afebrile, oxygen is 98% on room air, she has abnormal lung sounds all suggestive of a progressive pulmonary process.  Will obtain an ABG to make sure she is not hypercapnic or severely acidotic, lab work to rule out metabolic abnormalities or acute kidney injury.  Will also check for anemia with a CBC and a urinalysis for infection.  X-ray of the lungs to look for pneumonia, I do not think the patient needs a CT scan of the head as she had no head trauma and does not have any lateralizing weakness to suggest stroke.  The patient is agreeable to the plan.   Co morbidities that complicate the patient evaluation  COPD   Additional history obtained:  Additional history obtained from EMR External records from outside source obtained and reviewed including the patient has been seen in the emergency department approximately 2 months ago for abdominal pain, she was discharged, she has been seen in the office for asthma by Dr. Works with pulmonology, she was admitted to the hospital in December because of respiratory failure and non-ST elevation myocardial infarction Left heart cath was performed in February 2021 showing proximal LAD 50% stenosis normal left ventricular ejection fraction, patent stent to the proximal and mid RCA unchanged LAD   Lab Tests:  I Ordered, and personally interpreted labs.  The pertinent results include:     Imaging Studies ordered:  I ordered imaging studies including x-ray I independently visualized and interpreted imaging which showed large hiatal hernia, no signs of other significant abnormal pathology in the chest I agree with the radiologist interpretation   Cardiac Monitoring: / EKG:  The patient was maintained on a cardiac monitor.  I personally viewed and interpreted the cardiac monitored which showed an underlying rhythm of: Sinus tachycardia   Consultations Obtained:  I  requested consultation with the hospitalist,  and discussed lab and imaging findings as well as pertinent plan - they recommend: Admit to the hospital   Problem List / ED Course / Critical interventions / Medication management  The patient has hyponatremia, this is likely secondary to her decreased nourishment and persistent consumption of water.  She does appear slightly dehydrated clinically and persistently mildly tachycardic, I do not see any signs of infection I ordered medication including normal saline for hyponatremia Reevaluation of the patient after these medicines showed that the patient the patient overall appears very weak but stable I have reviewed the patients home medicines and have made adjustments as needed   Social Determinants of Health:  Not applicable   Test / Admission - Considered:  Will admit to the hospital         Final Clinical Impression(s) / ED Diagnoses Final diagnoses:  Hyponatremia  Hypothyroidism, unspecified type  Dehydration    Rx / DC Orders ED Discharge Orders     None         Eber Hong, MD 08/04/22 1902

## 2022-08-04 NOTE — Assessment & Plan Note (Addendum)
And mild leukopenia. Cause unclear doesn't seem to have ever had this before? Repeat CBC in AM

## 2022-08-05 DIAGNOSIS — R531 Weakness: Secondary | ICD-10-CM | POA: Diagnosis not present

## 2022-08-05 DIAGNOSIS — I499 Cardiac arrhythmia, unspecified: Secondary | ICD-10-CM | POA: Diagnosis not present

## 2022-08-05 DIAGNOSIS — Z1152 Encounter for screening for COVID-19: Secondary | ICD-10-CM | POA: Diagnosis not present

## 2022-08-05 DIAGNOSIS — E039 Hypothyroidism, unspecified: Secondary | ICD-10-CM | POA: Diagnosis not present

## 2022-08-05 DIAGNOSIS — G9341 Metabolic encephalopathy: Secondary | ICD-10-CM | POA: Diagnosis present

## 2022-08-05 DIAGNOSIS — I4719 Other supraventricular tachycardia: Secondary | ICD-10-CM | POA: Diagnosis present

## 2022-08-05 DIAGNOSIS — E876 Hypokalemia: Secondary | ICD-10-CM | POA: Diagnosis not present

## 2022-08-05 DIAGNOSIS — I2510 Atherosclerotic heart disease of native coronary artery without angina pectoris: Secondary | ICD-10-CM | POA: Diagnosis present

## 2022-08-05 DIAGNOSIS — Z7401 Bed confinement status: Secondary | ICD-10-CM | POA: Diagnosis not present

## 2022-08-05 DIAGNOSIS — E860 Dehydration: Secondary | ICD-10-CM | POA: Diagnosis present

## 2022-08-05 DIAGNOSIS — R4189 Other symptoms and signs involving cognitive functions and awareness: Secondary | ICD-10-CM | POA: Diagnosis present

## 2022-08-05 DIAGNOSIS — E7800 Pure hypercholesterolemia, unspecified: Secondary | ICD-10-CM | POA: Diagnosis present

## 2022-08-05 DIAGNOSIS — E8770 Fluid overload, unspecified: Secondary | ICD-10-CM | POA: Diagnosis present

## 2022-08-05 DIAGNOSIS — Z7952 Long term (current) use of systemic steroids: Secondary | ICD-10-CM | POA: Diagnosis not present

## 2022-08-05 DIAGNOSIS — I480 Paroxysmal atrial fibrillation: Secondary | ICD-10-CM | POA: Diagnosis present

## 2022-08-05 DIAGNOSIS — D696 Thrombocytopenia, unspecified: Secondary | ICD-10-CM | POA: Diagnosis present

## 2022-08-05 DIAGNOSIS — I4891 Unspecified atrial fibrillation: Secondary | ICD-10-CM | POA: Diagnosis not present

## 2022-08-05 DIAGNOSIS — J4541 Moderate persistent asthma with (acute) exacerbation: Secondary | ICD-10-CM | POA: Diagnosis present

## 2022-08-05 DIAGNOSIS — Z7983 Long term (current) use of bisphosphonates: Secondary | ICD-10-CM | POA: Diagnosis not present

## 2022-08-05 DIAGNOSIS — Z87892 Personal history of anaphylaxis: Secondary | ICD-10-CM | POA: Diagnosis not present

## 2022-08-05 DIAGNOSIS — M797 Fibromyalgia: Secondary | ICD-10-CM | POA: Diagnosis present

## 2022-08-05 DIAGNOSIS — J4540 Moderate persistent asthma, uncomplicated: Secondary | ICD-10-CM | POA: Diagnosis not present

## 2022-08-05 DIAGNOSIS — D72818 Other decreased white blood cell count: Secondary | ICD-10-CM | POA: Diagnosis present

## 2022-08-05 DIAGNOSIS — E871 Hypo-osmolality and hyponatremia: Secondary | ICD-10-CM | POA: Diagnosis not present

## 2022-08-05 DIAGNOSIS — E063 Autoimmune thyroiditis: Secondary | ICD-10-CM | POA: Diagnosis present

## 2022-08-05 DIAGNOSIS — E222 Syndrome of inappropriate secretion of antidiuretic hormone: Secondary | ICD-10-CM | POA: Diagnosis present

## 2022-08-05 DIAGNOSIS — Z7982 Long term (current) use of aspirin: Secondary | ICD-10-CM | POA: Diagnosis not present

## 2022-08-05 DIAGNOSIS — Z79890 Hormone replacement therapy (postmenopausal): Secondary | ICD-10-CM | POA: Diagnosis not present

## 2022-08-05 DIAGNOSIS — K219 Gastro-esophageal reflux disease without esophagitis: Secondary | ICD-10-CM | POA: Diagnosis present

## 2022-08-05 DIAGNOSIS — J441 Chronic obstructive pulmonary disease with (acute) exacerbation: Secondary | ICD-10-CM | POA: Diagnosis present

## 2022-08-05 DIAGNOSIS — Z7951 Long term (current) use of inhaled steroids: Secondary | ICD-10-CM | POA: Diagnosis not present

## 2022-08-05 LAB — BASIC METABOLIC PANEL WITH GFR
Anion gap: 13 (ref 5–15)
BUN: 24 mg/dL — ABNORMAL HIGH (ref 8–23)
CO2: 16 mmol/L — ABNORMAL LOW (ref 22–32)
Calcium: 7.8 mg/dL — ABNORMAL LOW (ref 8.9–10.3)
Chloride: 99 mmol/L (ref 98–111)
Creatinine, Ser: 1.2 mg/dL — ABNORMAL HIGH (ref 0.44–1.00)
GFR, Estimated: 45 mL/min — ABNORMAL LOW
Glucose, Bld: 157 mg/dL — ABNORMAL HIGH (ref 70–99)
Potassium: 3.4 mmol/L — ABNORMAL LOW (ref 3.5–5.1)
Sodium: 128 mmol/L — ABNORMAL LOW (ref 135–145)

## 2022-08-05 LAB — URINALYSIS, ROUTINE W REFLEX MICROSCOPIC
Bilirubin Urine: NEGATIVE
Glucose, UA: NEGATIVE mg/dL
Ketones, ur: 20 mg/dL — AB
Leukocytes,Ua: NEGATIVE
Nitrite: NEGATIVE
Protein, ur: NEGATIVE mg/dL
Specific Gravity, Urine: 1.013 (ref 1.005–1.030)
pH: 5 (ref 5.0–8.0)

## 2022-08-05 LAB — BASIC METABOLIC PANEL
Anion gap: 12 (ref 5–15)
BUN: 22 mg/dL (ref 8–23)
CO2: 18 mmol/L — ABNORMAL LOW (ref 22–32)
Calcium: 7.5 mg/dL — ABNORMAL LOW (ref 8.9–10.3)
Chloride: 104 mmol/L (ref 98–111)
Creatinine, Ser: 1.13 mg/dL — ABNORMAL HIGH (ref 0.44–1.00)
GFR, Estimated: 48 mL/min — ABNORMAL LOW (ref >60)
Glucose, Bld: 168 mg/dL — ABNORMAL HIGH (ref 70–99)
Potassium: 3.9 mmol/L (ref 3.5–5.1)
Sodium: 134 mmol/L — ABNORMAL LOW (ref 135–145)

## 2022-08-05 LAB — GLUCOSE, CAPILLARY
Glucose-Capillary: 183 mg/dL — ABNORMAL HIGH (ref 70–99)
Glucose-Capillary: 158 mg/dL — ABNORMAL HIGH (ref 70–99)
Glucose-Capillary: 163 mg/dL — ABNORMAL HIGH (ref 70–99)

## 2022-08-05 LAB — OSMOLALITY, URINE: Osmolality, Ur: 472 mOsm/kg (ref 300–900)

## 2022-08-05 LAB — RESPIRATORY PANEL BY PCR

## 2022-08-05 LAB — CBC
HCT: 41.3 % (ref 36.0–46.0)
Hemoglobin: 14.1 g/dL (ref 12.0–15.0)
MCH: 30.5 pg (ref 26.0–34.0)
MCHC: 34.1 g/dL (ref 30.0–36.0)
MCV: 89.4 fL (ref 80.0–100.0)
Platelets: 58 K/uL — ABNORMAL LOW (ref 150–400)
RBC: 4.62 MIL/uL (ref 3.87–5.11)
RDW: 13.4 % (ref 11.5–15.5)
WBC: 1.5 K/uL — ABNORMAL LOW (ref 4.0–10.5)
nRBC: 0 % (ref 0.0–0.2)

## 2022-08-05 LAB — SODIUM, URINE, RANDOM: Sodium, Ur: <10 mmol/L

## 2022-08-05 MED ORDER — ALBUTEROL SULFATE HFA 108 (90 BASE) MCG/ACT IN AERS
2.0000 | INHALATION_SPRAY | RESPIRATORY_TRACT | Status: DC | PRN
  Administered 2022-08-05 – 2022-08-06 (×3): 2 via RESPIRATORY_TRACT
  Filled 2022-08-05: qty 6.7

## 2022-08-05 MED ORDER — ADULT MULTIVITAMIN W/MINERALS CH
1.0000 | ORAL_TABLET | Freq: Every day | ORAL | Status: DC
  Administered 2022-08-05 – 2022-08-08 (×4): 1 via ORAL
  Filled 2022-08-05 (×4): qty 1

## 2022-08-05 MED ORDER — BOOST / RESOURCE BREEZE PO LIQD CUSTOM
1.0000 | Freq: Three times a day (TID) | ORAL | Status: DC
  Administered 2022-08-05 – 2022-08-07 (×6): 1 via ORAL

## 2022-08-05 NOTE — Consult Note (Signed)
Hematology/Oncology Consult Note  Clinical Summary: [name] is an 82 year old female who presents with generalized weakness, poor p.o. intake, and altered mental status.  Reason for Consult: Thrombocytopenia/Leukopenia  HPI: [name] is an 82 year old female with medical history significant for COPD frequently on steroid therapy who presents for evaluation of generalized weakness, poor p.o. intake, and altered mental status.  Ms. Sonia [name] presented to the emergency department on 08/04/2022.  At that time it was reported that she presented with generalized weakness that been progressive over the last week.  She had poor p.o. intake and was very thirsty.  She had slid off the edge of her bed onto the ground but was too weak to get off the ground, which is why she called 911.  On arrival to the emergency department the patient is found to have a sodium of 126, creatinine 1.28, calcium 7.9, white blood cell count 3.0, platelets of 61, and hemoglobin of 15.5.  Due to concern for her hematological abnormalities hematology was consulted for further evaluation and management.  On exam today Ms. Sonia [name] reports she feels improved today. She is not having any overt bleeding, bruising, or dark stools. She notes she eats a regular diet but has been restricting red meat over the last few years. She notes her energy levels are good. She denies any fevers, chills, sweats, nausea, vomiting, or diarrhea. A full 10 point ROS was otherwise negative.    O:  Vitals:   08/05/22 0857 08/05/22 1200  BP: 121/64 (!) 152/79  Pulse:  79  Resp: 16 14  Temp: (!) 97.3 F (36.3 C) 98 F (36.7 C)  SpO2:  97%      Latest Ref Rng & Units 08/05/2022    5:26 AM 08/05/2022   12:16 AM 08/04/2022    5:24 PM  CMP  Glucose 70 - 99 mg/dL 161  096    BUN 8 - 23 mg/dL 22  24    Creatinine 0.45 - 1.00 mg/dL 4.09  8.11    Sodium 914 - 145 mmol/L 134  128  130   Potassium 3.5 - 5.1 mmol/L 3.9  3.4  3.6    Chloride 98 - 111 mmol/L 104  99    CO2 22 - 32 mmol/L 18  16    Calcium 8.9 - 10.3 mg/dL 7.5  7.8        Latest Ref Rng & Units 08/05/2022    5:26 AM 08/04/2022    5:24 PM 08/04/2022    5:11 PM  CBC  WBC 4.0 - 10.5 K/uL 1.5   3.0   Hemoglobin 12.0 - 15.0 g/dL 78.2  95.6  21.3   Hematocrit 36.0 - 46.0 % 41.3  44.0  47.0   Platelets 150 - 400 K/uL 58   61       GENERAL: well appearing elderly Caucasian female in NAD  SKIN: skin color, texture, turgor are normal, no rashes or significant lesions EYES: conjunctiva are pink and non-injected, sclera clear LUNGS: clear to auscultation and percussion with normal breathing effort HEART: regular rate & rhythm and no murmurs and no lower extremity edema Musculoskeletal: no cyanosis of digits and no clubbing  PSYCH: alert & oriented x 3, fluent speech NEURO: no focal motor/sensory deficits  Assessment/Plan:  #Thrombocytopenia  #Leukopenia --The thrombocytopenia and leukopenia appear to occurred rapidly, as her counts in March 2024 were normal. --will assess nutritional levels with Vitamin b12 and folate.   --will review peripheral blood film to r/o  clumping  --viral serologies with Hepatitis B, Hepatitis C, and HIV  --no evidence of liver disease/splenomegaly on abdominal US from 04/04/2022.  --ESR to CRP to assess for inflammation -- SPEP and SFLC to r/o a monoclonal gammopathy.   Ulysees Barns, MD Department of Hematology/Oncology [facility] at [facility] Phone: [phone] Pager: [phone] Email: [email or web address]

## 2022-08-05 NOTE — Evaluation (Signed)
Physical Therapy Evaluation Patient Details Name: Sonia [name] MRN: [id number] DOB: 1940-11-30 Today's Date: 08/05/2022  History of Present Illness  Pt is an 82 year old woman admitted on 5/25 with weakness x 1 week, poor appetite and a fall from the edge of her bed. + bronchial asthma exacerbation, hyponatremia, dehydration, leukopenia, thrombocytopenia. PMH: asthma and COPD--steroid dependent, hypothyroidism, HLD, CAD, fibromyalgia, breast cancer.  Clinical Impression  Patient presents with decreased mobility due to decreased strength, decreased activity tolerance and decreased cognition.  Currently min A for hallway ambulation with RW.  Previously independent without assistive device.  She will benefit from skilled PT in the acute setting to maximize safety and independence.  Depending on progress she may need short term inpatient rehab <3 hours/day prior to d/c home.        Recommendations for follow up therapy are one component of a multi-disciplinary discharge planning process, led by the attending physician.  Recommendations may be updated based on patient status, additional functional criteria and insurance authorization.  Follow Up Recommendations Can patient physically be transported by private vehicle: Yes     Assistance Recommended at Discharge Intermittent Supervision/Assistance  Patient can return home with the following  A little help with walking and/or transfers;Assistance with cooking/housework;Assist for transportation;Help with stairs or ramp for entrance;A little help with bathing/dressing/bathroom    Equipment Recommendations None recommended by PT  Recommendations for Other Services       Functional Status Assessment Patient has had a recent decline in their functional status and demonstrates the ability to make significant improvements in function in a reasonable and predictable amount of time.     Precautions / Restrictions Precautions Precautions:  Fall      Mobility  Bed Mobility Overal bed mobility: Needs Assistance Bed Mobility: Supine to Sit, Sit to Supine     Supine to sit: Supervision Sit to supine: Supervision        Transfers Overall transfer level: Needs assistance Equipment used: Rolling walker (2 wheels) Transfers: Sit to/from Stand Sit to Stand: Min assist           General transfer comment: help to balance and cues for hand placement    Ambulation/Gait Ambulation/Gait assistance: Min guard, Min assist Gait Distance (Feet): 120 Feet Assistive device: Rolling walker (2 wheels) Gait Pattern/deviations: Step-through pattern, Decreased stride length, Wide base of support       General Gait Details: slower pace, noted tremulous throughout and min A to minguard A for balance  Stairs            Wheelchair Mobility    Modified Rankin (Stroke Patients Only)       Balance Overall balance assessment: Needs assistance   Sitting balance-Leahy Scale: Good       Standing balance-Leahy Scale: Poor Standing balance comment: UE support needed for balance                             Pertinent Vitals/Pain Pain Assessment Pain Assessment: Faces Faces Pain Scale: Hurts little more Pain Location: generalized Pain Descriptors / Indicators: Discomfort Pain Intervention(s): Monitored during session    Home Living Family/patient expects to be discharged to:: Private residence Living Arrangements: Alone Available Help at Discharge: Family;Available PRN/intermittently Type of Home: Other(Comment) (townhouse) Home Access: Stairs to enter Entrance Stairs-Rails: Right Entrance Stairs-Number of Steps: 3   Home Layout: One level Home Equipment: Grab bars - tub/shower;Grab bars - toilet;Shower seat - built in;Cane -  single point;Hand held shower head      Prior Function Prior Level of Function : Independent/Modified Independent;Driving             Mobility Comments: taking care of  her small dog       Hand Dominance   Dominant Hand: Right    Extremity/Trunk Assessment   Upper Extremity Assessment Upper Extremity Assessment: Defer to OT evaluation    Lower Extremity Assessment Lower Extremity Assessment: Generalized weakness    Cervical / Trunk Assessment Cervical / Trunk Assessment: Kyphotic  Communication   Communication: No difficulties  Cognition Arousal/Alertness: Awake/alert Behavior During Therapy: WFL for tasks assessed/performed, Anxious Overall Cognitive Status: Impaired/Different from baseline Area of Impairment: Memory, Safety/judgement                     Memory: Decreased short-term memory   Safety/Judgement: Decreased awareness of deficits, Decreased awareness of safety              General Comments General comments (skin integrity, edema, etc.): Discussed recommendations for follow up inpatient rehab with pt and she is considering.    Exercises     Assessment/Plan    PT Assessment Patient needs continued PT services  PT Problem List Decreased mobility;Decreased cognition;Decreased balance;Decreased strength;Decreased activity tolerance;Decreased safety awareness;Decreased knowledge of use of DME       PT Treatment Interventions DME instruction;Functional mobility training;Balance training;Patient/family education;Gait training;Therapeutic activities;Stair training;Therapeutic exercise    PT Goals (Current goals can be found in the Care Plan section)  Acute Rehab PT Goals Patient Stated Goal: to return to independent PT Goal Formulation: With patient Time For Goal Achievement: 08/19/22 Potential to Achieve Goals: Good    Frequency Min 3X/week     Co-evaluation               AM-PAC PT "6 Clicks" Mobility  Outcome Measure Help needed turning from your back to your side while in a flat bed without using bedrails?: A Little Help needed moving from lying on your back to sitting on the side of a flat bed  without using bedrails?: A Little Help needed moving to and from a bed to a chair (including a wheelchair)?: A Little Help needed standing up from a chair using your arms (e.g., wheelchair or bedside chair)?: A Little Help needed to walk in hospital room?: A Little Help needed climbing 3-5 steps with a railing? : Total 6 Click Score: 16    End of Session Equipment Utilized During Treatment: Gait belt Activity Tolerance: Patient limited by fatigue Patient left: in bed;with call bell/phone within reach Nurse Communication: Mobility status PT Visit Diagnosis: Other abnormalities of gait and mobility (R26.89);Muscle weakness (generalized) (M62.81);Other symptoms and signs involving the nervous system (R29.898)    Time: 2956-2130 PT Time Calculation (min) (ACUTE ONLY): 24 min   Charges:   PT Evaluation $PT Eval Moderate Complexity: 1 Mod PT Treatments $Gait Training: 8-22 mins        Sheran Lawless, PT Acute Rehabilitation Services Office:[phone] 08/05/2022   Elray Mcgregor 08/05/2022, 4:54 PM

## 2022-08-05 NOTE — Progress Notes (Addendum)
PROGRESS NOTE        PATIENT DETAILS Name: Sonia [name] Age: 82 y.o. Sex: female Date of Birth: [birth year] Admit Date: 08/04/2022 Admitting Physician Hillary Bow, DO WUJ:[id number], Jasmine December, MD  Brief Summary: Patient is a 82 y.o.  female with a history of steroid-dependent asthma/COPD, hypothyroidism who presented with weakness.  She was found to have bronchial asthma exacerbation, hyponatremia, leukopenia and thrombocytopenia.  She was subsequently admitted to the hospitalist service.  Significant events: 5/25>> admit to [facility]  Significant studies: 5/25>> CXR: No PNA.  Significant microbiology data: 5/25>> respiratory virus panel: Negative 5/25>> COVID PCR: Negative  Procedures: None  Consults: None  Subjective: Lying comfortably in bed-denies any chest pain or shortness of breath.  Still feels weak-very poor historian-no family at bedside.  Objective: Vitals: Blood pressure 121/64, pulse 71, temperature (!) 97.3 F (36.3 C), temperature source Oral, resp. rate 16, height 5' 4.02" (1.626 m), weight 65 kg, SpO2 95 %.   Exam: Gen Exam:Alert awake-not in any distress HEENT:atraumatic, normocephalic Chest: B/L clear to auscultation anteriorly CVS:S1S2 regular Abdomen:soft non tender, non distended Extremities:no edema Neurology: Non focal Skin: no rash  Pertinent Labs/Radiology:    Latest Ref Rng & Units 08/05/2022    5:26 AM 08/04/2022    5:24 PM 08/04/2022    5:11 PM  CBC  WBC 4.0 - 10.5 K/uL 1.5   3.0   Hemoglobin 12.0 - 15.0 g/dL 82.9  56.2  13.0   Hematocrit 36.0 - 46.0 % 41.3  44.0  47.0   Platelets 150 - 400 K/uL 58   61     Lab Results  Component Value Date   NA 134 (L) 08/05/2022   K 3.9 08/05/2022   CL 104 08/05/2022   CO2 18 (L) 08/05/2022     Assessment/Plan: Bronchial asthma with exacerbation Overall improved Continue tapering prednisone Continue bronchodilators  Hyponatremia Likely due to poor oral  intake Rapidly improving with supportive care/IVF.  Leukopenia/thrombocytopenia Unclear etiology Respiratory virus panel/COVID negative Due to worsening leukopenia-will check differential along with CBC with morning labs tomorrow. Will discuss with hematology on-call  Hypothyroidism TSH only minimally elevated Continue current dosing of Synthroid and follow-up with PCP for further adjustment.    HLD Apparently no longer on statin or Zetia.  GERD Pepcid  ?  Cognitive dysfunction Poor historian-answers questions but is all over the place-does not know details about specific events Will discuss with family to see if this is her baseline.  Addendum 1:30 PM Was finally able to reach patient's daughter over the phone and get more history.  Apparently patient lives by herself.  Last Monday patient had headache and nausea, this was followed by several days of diarrhea and worsening weakness.  She normally is pretty alert/awake-and her confusion is new-and not at baseline. Suspect that hyponatremia was probably due to poor oral intake/diarrhea-this has improved with IVF Suspect leukopenia/thrombocytopenia could be due to recent viral illness.  Awaiting callback from oncology/hematology to discuss further. Confusion-could be from dehydration-she has no focal deficits.  Will taper steroids-follow clinical course-if no improvement in the next day or so-will initiate further workup with neuroimaging etc.   BMI: Estimated body mass index is 24.58 kg/m as calculated from the following:   Height as of this encounter: 5' 4.02" (1.626 m).   Weight as of this encounter: 65 kg.   Code  status:   Code Status: Full Code   DVT Prophylaxis: Place and maintain sequential compression device Start: 08/04/22 2010   Family Communication: Daughter-[phone]-called-VM full-5/26   Disposition Plan: Status is: Observation The patient will require care spanning > 2 midnights and should be moved  to inpatient because: Severity of illness   Planned Discharge Destination:Home health   Diet: Diet Order             Diet Heart Room service appropriate? Yes; Fluid consistency: Thin  Diet effective now                     Antimicrobial agents: Anti-infectives (From admission, onward)    None        MEDICATIONS: Scheduled Meds:  levothyroxine  125 mcg Oral Q0600   mometasone-formoterol  2 puff Inhalation BID   predniSONE  40 mg Oral Q breakfast   Continuous Infusions:  sodium chloride 100 mL/hr at 08/05/22 0711   PRN Meds:.albuterol, famotidine   I have personally reviewed following labs and imaging studies  LABORATORY DATA: CBC: Recent Labs  Lab 08/04/22 1711 08/04/22 1724 08/05/22 0526  WBC 3.0*  --  1.5*  NEUTROABS 2.2  --   --   HGB 15.5* 15.0 14.1  HCT 47.0* 44.0 41.3  MCV 90.2  --  89.4  PLT 61*  --  58*    Basic Metabolic Panel: Recent Labs  Lab 08/04/22 1711 08/04/22 1724 08/05/22 0016 08/05/22 0526  NA 126* 130* 128* 134*  K 3.6 3.6 3.4* 3.9  CL 94*  --  99 104  CO2 19*  --  16* 18*  GLUCOSE 92  --  157* 168*  BUN 24*  --  24* 22  CREATININE 1.28*  --  1.20* 1.13*  CALCIUM 7.9*  --  7.8* 7.5*  MG 2.1  --   --   --     GFR: Estimated Creatinine Clearance: 32.6 mL/min (A) (by C-G formula based on SCr of 1.13 mg/dL (H)).  Liver Function Tests: Recent Labs  Lab 08/04/22 1711  AST 70*  ALT 22  ALKPHOS 48  BILITOT 0.8  PROT 5.6*  ALBUMIN 2.9*   No results for input(s): "LIPASE", "AMYLASE" in the last 168 hours. No results for input(s): "AMMONIA" in the last 168 hours.  Coagulation Profile: No results for input(s): "INR", "PROTIME" in the last 168 hours.  Cardiac Enzymes: Recent Labs  Lab 08/04/22 1711  CKTOTAL 291*    BNP (last 3 results) No results for input(s): "PROBNP" in the last 8760 hours.  Lipid Profile: No results for input(s): "CHOL", "HDL", "LDLCALC", "TRIG", "CHOLHDL", "LDLDIRECT" in the last 72  hours.  Thyroid Function Tests: Recent Labs    08/04/22 1711  TSH 5.405*  FREET4 0.85    Anemia Panel: No results for input(s): "VITAMINB12", "FOLATE", "FERRITIN", "TIBC", "IRON", "RETICCTPCT" in the last 72 hours.  Urine analysis:    Component Value Date/Time   COLORURINE YELLOW 08/05/2022 0616   APPEARANCEUR HAZY (A) 08/05/2022 0616   LABSPEC 1.013 08/05/2022 0616   PHURINE 5.0 08/05/2022 0616   GLUCOSEU NEGATIVE 08/05/2022 0616   GLUCOSEU NEGATIVE 11/28/2010 1422   HGBUR SMALL (A) 08/05/2022 0616   BILIRUBINUR NEGATIVE 08/05/2022 0616   KETONESUR 20 (A) 08/05/2022 0616   PROTEINUR NEGATIVE 08/05/2022 0616   UROBILINOGEN 0.2 11/28/2010 1422   NITRITE NEGATIVE 08/05/2022 0616   LEUKOCYTESUR NEGATIVE 08/05/2022 0616    Sepsis Labs: Lactic Acid, Venous    Component Value Date/Time  LATICACIDVEN 1.0 08/04/2022 1952    MICROBIOLOGY: Recent Results (from the past 240 hour(s))  Respiratory (~20 pathogens) panel by PCR     Status: None   Collection Time: 08/04/22  9:54 PM   Specimen: Nasopharyngeal Swab; Respiratory  Result Value Ref Range Status   Adenovirus NOT DETECTED NOT DETECTED Final   Coronavirus 229E NOT DETECTED NOT DETECTED Final    Comment: (NOTE) The Coronavirus on the Respiratory Panel, DOES NOT test for the novel  Coronavirus (2019 nCoV)    Coronavirus HKU1 NOT DETECTED NOT DETECTED Final   Coronavirus NL63 NOT DETECTED NOT DETECTED Final   Coronavirus OC43 NOT DETECTED NOT DETECTED Final   Metapneumovirus NOT DETECTED NOT DETECTED Final   Rhinovirus / Enterovirus NOT DETECTED NOT DETECTED Final   Influenza A NOT DETECTED NOT DETECTED Final   Influenza B NOT DETECTED NOT DETECTED Final   Parainfluenza Virus 1 NOT DETECTED NOT DETECTED Final   Parainfluenza Virus 2 NOT DETECTED NOT DETECTED Final   Parainfluenza Virus 3 NOT DETECTED NOT DETECTED Final   Parainfluenza Virus 4 NOT DETECTED NOT DETECTED Final   Respiratory Syncytial Virus NOT  DETECTED NOT DETECTED Final   Bordetella pertussis NOT DETECTED NOT DETECTED Final   Bordetella Parapertussis NOT DETECTED NOT DETECTED Final   Chlamydophila pneumoniae NOT DETECTED NOT DETECTED Final   Mycoplasma pneumoniae NOT DETECTED NOT DETECTED Final    Comment: Performed at [facility], [address] [place], [place] [address]  SARS Coronavirus 2 by RT PCR (hospital order, performed in [facility] hospital lab) *cepheid single result test* Nasopharyngeal Swab     Status: None   Collection Time: 08/04/22  9:54 PM   Specimen: Nasopharyngeal Swab; Nasal Swab  Result Value Ref Range Status   SARS Coronavirus 2 by RT PCR NEGATIVE NEGATIVE Final    Comment: Performed at [facility], [address], [place], [place] [address]  MRSA Next Gen by PCR, Nasal     Status: None   Collection Time: 08/04/22  9:54 PM   Specimen: Nasopharyngeal Swab; Nasal Swab  Result Value Ref Range Status   MRSA by PCR Next Gen NOT DETECTED NOT DETECTED Final    Comment: (NOTE) The GeneXpert MRSA Assay (FDA approved for NASAL specimens only), is one component of a comprehensive MRSA colonization surveillance program. It is not intended to diagnose MRSA infection nor to guide or monitor treatment for MRSA infections. Test performance is not FDA approved in patients less than 27 years old. Performed at [facility], [address] [place], [place] [address]     RADIOLOGY STUDIES/RESULTS: DG Chest Port 1 View  Result Date: 08/04/2022 CLINICAL DATA:  Cough, shortness of breath, weakness EXAM: PORTABLE CHEST 1 VIEW COMPARISON:  04/08/2022 FINDINGS: Large hiatal hernia. Heart and mediastinal contours within normal limits. Aortic atherosclerosis. No confluent opacities or effusions. No acute bony abnormality. IMPRESSION: No active cardiopulmonary disease. Stable large hiatal hernia. Electronically Signed   By: Charlett Nose M.D.   On: 08/04/2022 17:32     LOS: 0 days   Jeoffrey Massed,  MD  [facility]    To contact the attending provider between 7A-7P or the covering provider during after hours 7P-7A, please log into the web site [email or web address] and access using universal [place] password for that web site. If you do not have the password, please call the hospital operator.  08/05/2022, 11:08 AM

## 2022-08-05 NOTE — Progress Notes (Addendum)
Initial Nutrition Assessment  DOCUMENTATION CODES:      INTERVENTION:  Trial Boost Breeze po BID, each supplement provides 250 kcal and 9 grams of protein.  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   GOAL:   Patient will meet greater than or equal to 90% of their needs   MONITOR:   PO intake, Weight trends, Supplement acceptance, Skin, I & O's, Labs  REASON FOR ASSESSMENT:   Consult Assessment of nutrition requirement/status  ASSESSMENT:   82 y.o. female with PMHx including asthma/COPD, steroid dependent, hypothyroidism presents with generalized weakness which has progressed over the last week along with exacerbated asthma and wheezing  Patient is a poor historian, history obtained from chart  Per MD notes:  -Patient does not want to eat but has been trying to stay hydrated due to extreme thirst -poor po intake x last few days   Patient noted that she wants to go home and would not have come to the hospital hadn't her family urged her to do so.   Labs: Na 134, Glu 168, Cr 1.13  Meds: deltasone Wt: 2.6 kg (4%) wt loss x 2 months  Po: 50% meal intake x 1 documented meal  I/O's:  +1.5L  NUTRITION - FOCUSED PHYSICAL EXAM:  RD working remotely  Diet Order:   Diet Order             Diet Heart Room service appropriate? Yes; Fluid consistency: Thin  Diet effective now                   EDUCATION NEEDS:      Skin:  Skin Assessment: Reviewed RN Assessment  Last BM:  5/25  Height:   Ht Readings from Last 1 Encounters:  08/04/22 5' 4.02" (1.626 m)    Weight:   Wt Readings from Last 1 Encounters:  08/04/22 65 kg    Ideal Body Weight:     BMI:  Body mass index is 24.58 kg/m.  Estimated  Nutritional Needs:   Kcal:  1625-1950 kcal  Protein:  80-100 g  Fluid:  > 1.6L    Leodis Rains, RDN, LDN  Clinical Nutrition

## 2022-08-05 NOTE — Evaluation (Signed)
Occupational Therapy Evaluation Patient Details Name: Sonia [name] MRN: [id number] DOB: May 14, 1940 Today's Date: 08/05/2022   History of Present Illness Pt is an 82 year old woman admitted on 5/25 with weakness x 1 week, poor appetite and a fall from the edge of her bed. + bronchial asthma exacerbation, hyponatremia, dehydration, leukopenia, thrombocytopenia. PMH: asthma and COPD--steroid dependent, hypothyroidism, HLD, CAD, fibromyalgia, breast cancer.   Clinical Impression   Pt lives alone, walks with a cane and is independent in ADLs, IADLs and drives. She was attending OPPT for shoulder rehabilitation. Pt states she has good and bad days, but unable to state the ratio of good to bad days within a week. Pt presents with anxiety, generalized weakness and impaired standing balance. She is tremulous. Pt with impaired memory and awareness of deficits/safety. She stood and transferred with hand held assist. She requires set up to min assist for ADLs sit<>stand at EOB. VSS on RW with SpO2 of 97%, pt purse lip breathing with exertion. Patient will benefit from continued inpatient follow up therapy, <3 hours/day.      Recommendations for follow up therapy are one component of a multi-disciplinary discharge planning process, led by the attending physician.  Recommendations may be updated based on patient status, additional functional criteria and insurance authorization.   Assistance Recommended at Discharge Frequent or constant Supervision/Assistance  Patient can return home with the following A little help with walking and/or transfers;A little help with bathing/dressing/bathroom;Assistance with cooking/housework;Assist for transportation;Help with stairs or ramp for entrance;Direct supervision/assist for medications management    Functional Status Assessment  Patient has had a recent decline in their functional status and demonstrates the ability to make significant improvements in  function in a reasonable and predictable amount of time.  Equipment Recommendations  Other (comment) (RW)    Recommendations for Other Services       Precautions / Restrictions Precautions Precautions: Fall      Mobility Bed Mobility Overal bed mobility: Needs Assistance Bed Mobility: Supine to Sit, Sit to Supine     Supine to sit: Supervision Sit to supine: Supervision        Transfers Overall transfer level: Needs assistance Equipment used: 1 person hand held assist Transfers: Sit to/from Stand, Bed to chair/wheelchair/BSC Sit to Stand: Min assist Stand pivot transfers: Min assist         General transfer comment: steadying assist      Balance Overall balance assessment: Needs assistance   Sitting balance-Leahy Scale: Good       Standing balance-Leahy Scale: Poor                             ADL either performed or assessed with clinical judgement   ADL Overall ADL's : Needs assistance/impaired Eating/Feeding: Independent;Bed level   Grooming: Wash/dry hands;Bed level;Set up   Upper Body Bathing: Set up;Sitting   Lower Body Bathing: Minimal assistance;Sit to/from stand   Upper Body Dressing : Set up;Sitting   Lower Body Dressing: Minimal assistance;Sit to/from stand   Toilet Transfer: Minimal assistance;Stand-pivot;BSC/3in1   Toileting- Clothing Manipulation and Hygiene: Set up;Sitting/lateral lean               Vision Ability to See in Adequate Light: 0 Adequate Patient Visual Report: No change from baseline       Perception     Praxis      Pertinent Vitals/Pain       Hand Dominance Right   Extremity/Trunk  Assessment Upper Extremity Assessment Upper Extremity Assessment: Generalized weakness (tremulous)   Lower Extremity Assessment Lower Extremity Assessment: Defer to PT evaluation   Cervical / Trunk Assessment Cervical / Trunk Assessment: Kyphotic   Communication Communication Communication: No  difficulties   Cognition Arousal/Alertness: Awake/alert Behavior During Therapy: WFL for tasks assessed/performed, Anxious Overall Cognitive Status: Impaired/Different from baseline Area of Impairment: Memory, Safety/judgement                     Memory: Decreased short-term memory   Safety/Judgement: Decreased awareness of deficits, Decreased awareness of safety           General Comments       Exercises     Shoulder Instructions      Home Living Family/patient expects to be discharged to:: Private residence Living Arrangements: Alone Available Help at Discharge: Family;Available PRN/intermittently Type of Home: Other(Comment) (townhouse) Home Access: Stairs to enter [employer] of Steps: 3 Entrance Stairs-Rails: Right Home Layout: One level     Bathroom Shower/Tub: Producer, television/film/video: Handicapped height     Home Equipment: Grab bars - tub/shower;Grab bars - toilet;Shower seat - built in;Cane - single point;Hand held shower head          Prior Functioning/Environment Prior Level of Function : Independent/Modified Independent;Driving                        OT Problem List: Decreased strength;Decreased activity tolerance;Impaired balance (sitting and/or standing);Decreased cognition;Decreased safety awareness;Cardiopulmonary status limiting activity      OT Treatment/Interventions: Self-care/ADL training;DME and/or AE instruction;Energy conservation;Therapeutic activities;Patient/family education;Balance training;Cognitive remediation/compensation    OT Goals(Current goals can be found in the care plan section) Acute Rehab OT Goals OT Goal Formulation: With patient Time For Goal Achievement: 08/19/22 Potential to Achieve Goals: Good ADL Goals Pt Will Perform Grooming: with supervision;standing (2 activities) Pt Will Perform Lower Body Bathing: with supervision;sit to/from stand Pt Will Perform Lower Body Dressing:  with supervision;sit to/from stand Pt Will Transfer to Toilet: with supervision;ambulating;bedside commode Pt Will Perform Toileting - Clothing Manipulation and hygiene: with supervision;sit to/from stand Additional ADL Goal #1: Pt will recall at least 3 energy conservation strategies as instructed.  OT Frequency: Min 2X/week    Co-evaluation              AM-PAC OT "6 Clicks" Daily Activity     Outcome Measure Help from another person eating meals?: None Help from another person taking care of personal grooming?: None Help from another person toileting, which includes using toliet, bedpan, or urinal?: A Little Help from another person bathing (including washing, rinsing, drying)?: A Little Help from another person to put on and taking off regular upper body clothing?: A Little Help from another person to put on and taking off regular lower body clothing?: A Little 6 Click Score: 20   End of Session Equipment Utilized During Treatment: Gait belt  Activity Tolerance: Patient limited by fatigue Patient left: in bed;with call bell/phone within reach;with bed alarm set;with family/visitor present  OT Visit Diagnosis: Unsteadiness on feet (R26.81);Other abnormalities of gait and mobility (R26.89);Muscle weakness (generalized) (M62.81);Other (comment);History of falling (Z91.81) (decreased activity tolerance)                Time: 1347-1420 OT Time Calculation (min): 33 min Charges:  OT General Charges $OT Visit: 1 Visit OT Evaluation $OT Eval Moderate Complexity: 1 Mod OT Treatments $Self Care/Home Management : 8-22 mins  Raynelle Fanning  M, OTR/L Acute Rehabilitation Services Office: [phone]  Evern Bio 08/05/2022, 2:31 PM

## 2022-08-06 DIAGNOSIS — D696 Thrombocytopenia, unspecified: Secondary | ICD-10-CM | POA: Diagnosis not present

## 2022-08-06 DIAGNOSIS — E871 Hypo-osmolality and hyponatremia: Secondary | ICD-10-CM

## 2022-08-06 DIAGNOSIS — Z7952 Long term (current) use of systemic steroids: Secondary | ICD-10-CM

## 2022-08-06 DIAGNOSIS — E039 Hypothyroidism, unspecified: Secondary | ICD-10-CM

## 2022-08-06 DIAGNOSIS — J4540 Moderate persistent asthma, uncomplicated: Secondary | ICD-10-CM

## 2022-08-06 LAB — CBC WITH DIFFERENTIAL/PLATELET
Abs Immature Granulocytes: 0.02 10*3/uL (ref 0.00–0.07)
Abs Immature Granulocytes: 0 10*3/uL (ref 0.00–0.07)
Band Neutrophils: 4 %
Basophils Absolute: 0 10*3/uL (ref 0.0–0.1)
Basophils Absolute: 0 10*3/uL (ref 0.0–0.1)
Basophils Relative: 1 %
Basophils Relative: 0 %
Eosinophils Absolute: 0 10*3/uL (ref 0.0–0.5)
Eosinophils Absolute: 0 10*3/uL (ref 0.0–0.5)
Eosinophils Relative: 0 %
Eosinophils Relative: 0 %
HCT: 41.5 % (ref 36.0–46.0)
HCT: 39.9 % (ref 36.0–46.0)
Hemoglobin: 14.3 g/dL (ref 12.0–15.0)
Hemoglobin: 13.8 g/dL (ref 12.0–15.0)
Immature Granulocytes: 0 %
Lymphocytes Relative: 13 %
Lymphocytes Relative: 13 %
Lymphs Abs: 0.8 10*3/uL (ref 0.7–4.0)
Lymphs Abs: 0.8 10*3/uL (ref 0.7–4.0)
MCH: 30.3 pg (ref 26.0–34.0)
MCH: 29.9 pg (ref 26.0–34.0)
MCHC: 34.5 g/dL (ref 30.0–36.0)
MCHC: 34.6 g/dL (ref 30.0–36.0)
MCV: 87.9 fL (ref 80.0–100.0)
MCV: 86.4 fL (ref 80.0–100.0)
Monocytes Absolute: 0.3 10*3/uL (ref 0.1–1.0)
Monocytes Absolute: 0 10*3/uL — ABNORMAL LOW (ref 0.1–1.0)
Monocytes Relative: 4 %
Monocytes Relative: 0 %
Neutro Abs: 5.3 10*3/uL (ref 1.7–7.7)
Neutro Abs: 5.4 10*3/uL (ref 1.7–7.7)
Neutrophils Relative %: 82 %
Neutrophils Relative %: 83 %
Platelets: 61 10*3/uL — ABNORMAL LOW (ref 150–400)
Platelets: 63 10*3/uL — ABNORMAL LOW (ref 150–400)
RBC: 4.72 MIL/uL (ref 3.87–5.11)
RBC: 4.62 MIL/uL (ref 3.87–5.11)
RDW: 13.3 % (ref 11.5–15.5)
RDW: 13.3 % (ref 11.5–15.5)
WBC: 6.4 10*3/uL (ref 4.0–10.5)
WBC: 6.2 10*3/uL (ref 4.0–10.5)
nRBC: 0 % (ref 0.0–0.2)
nRBC: 0 % (ref 0.0–0.2)

## 2022-08-06 LAB — FOLATE: Folate: 24 ng/mL (ref >5.9)

## 2022-08-06 LAB — BASIC METABOLIC PANEL
Anion gap: 7 (ref 5–15)
Anion gap: 9 (ref 5–15)
BUN: 19 mg/dL (ref 8–23)
BUN: 24 mg/dL — ABNORMAL HIGH (ref 8–23)
CO2: 21 mmol/L — ABNORMAL LOW (ref 22–32)
CO2: 20 mmol/L — ABNORMAL LOW (ref 22–32)
Calcium: 7.8 mg/dL — ABNORMAL LOW (ref 8.9–10.3)
Calcium: 7.6 mg/dL — ABNORMAL LOW (ref 8.9–10.3)
Chloride: 107 mmol/L (ref 98–111)
Chloride: 104 mmol/L (ref 98–111)
Creatinine, Ser: 0.86 mg/dL (ref 0.44–1.00)
Creatinine, Ser: 1.13 mg/dL — ABNORMAL HIGH (ref 0.44–1.00)
GFR, Estimated: >60 mL/min (ref >60)
GFR, Estimated: 48 mL/min — ABNORMAL LOW (ref >60)
Glucose, Bld: 105 mg/dL — ABNORMAL HIGH (ref 70–99)
Glucose, Bld: 147 mg/dL — ABNORMAL HIGH (ref 70–99)
Potassium: 3.5 mmol/L (ref 3.5–5.1)
Potassium: 3.8 mmol/L (ref 3.5–5.1)
Sodium: 135 mmol/L (ref 135–145)
Sodium: 133 mmol/L — ABNORMAL LOW (ref 135–145)

## 2022-08-06 LAB — MAGNESIUM: Magnesium: 2.1 mg/dL (ref 1.7–2.4)

## 2022-08-06 LAB — GLUCOSE, CAPILLARY
Glucose-Capillary: 75 mg/dL (ref 70–99)
Glucose-Capillary: 93 mg/dL (ref 70–99)
Glucose-Capillary: 128 mg/dL — ABNORMAL HIGH (ref 70–99)

## 2022-08-06 LAB — AMMONIA: Ammonia: 24 umol/L (ref 9–35)

## 2022-08-06 LAB — HEPATIC FUNCTION PANEL
ALT: 19 U/L (ref 0–44)
AST: 56 U/L — ABNORMAL HIGH (ref 15–41)
Albumin: 2.6 g/dL — ABNORMAL LOW (ref 3.5–5.0)
Alkaline Phosphatase: 54 U/L (ref 38–126)
Bilirubin, Direct: 0.1 mg/dL (ref 0.0–0.2)
Indirect Bilirubin: 0.8 mg/dL (ref 0.3–0.9)
Total Bilirubin: 0.9 mg/dL (ref 0.3–1.2)
Total Protein: 5.1 g/dL — ABNORMAL LOW (ref 6.5–8.1)

## 2022-08-06 LAB — VITAMIN B12: Vitamin B-12: 4202 pg/mL — ABNORMAL HIGH (ref 180–914)

## 2022-08-06 LAB — SEDIMENTATION RATE: Sed Rate: 1 mm/hr (ref 0–22)

## 2022-08-06 LAB — C-REACTIVE PROTEIN: CRP: 1.9 mg/dL — ABNORMAL HIGH (ref <1.0)

## 2022-08-06 LAB — HEPATITIS C ANTIBODY: HCV Ab: NONREACTIVE

## 2022-08-06 LAB — HEPATITIS B CORE ANTIBODY, TOTAL: Hep B Core Total Ab: NONREACTIVE

## 2022-08-06 LAB — HEPATITIS B SURFACE ANTIBODY,QUALITATIVE: Hep B S Ab: NONREACTIVE

## 2022-08-06 LAB — LACTATE DEHYDROGENASE: LDH: 287 U/L — ABNORMAL HIGH (ref 98–192)

## 2022-08-06 LAB — HEPATITIS B SURFACE ANTIGEN: Hepatitis B Surface Ag: NONREACTIVE

## 2022-08-06 LAB — IMMATURE PLATELET FRACTION: Immature Platelet Fraction: 20.2 % — ABNORMAL HIGH (ref 1.2–8.6)

## 2022-08-06 MED ORDER — PREDNISONE 5 MG PO TABS
30.0000 mg | ORAL_TABLET | Freq: Every day | ORAL | Status: DC
  Administered 2022-08-07: 30 mg via ORAL
  Filled 2022-08-06: qty 2

## 2022-08-06 MED ORDER — IPRATROPIUM-ALBUTEROL 0.5-2.5 (3) MG/3ML IN SOLN
3.0000 mL | Freq: Two times a day (BID) | RESPIRATORY_TRACT | Status: DC
  Administered 2022-08-07 – 2022-08-08 (×3): 3 mL via RESPIRATORY_TRACT
  Filled 2022-08-06 (×4): qty 3

## 2022-08-06 MED ORDER — DILTIAZEM LOAD VIA INFUSION
10.0000 mg | Freq: Once | INTRAVENOUS | Status: AC
  Administered 2022-08-07: 10 mg via INTRAVENOUS
  Filled 2022-08-06: qty 10

## 2022-08-06 MED ORDER — BUDESONIDE 0.25 MG/2ML IN SUSP
0.2500 mg | Freq: Two times a day (BID) | RESPIRATORY_TRACT | Status: DC
  Administered 2022-08-07 – 2022-08-08 (×3): 0.25 mg via RESPIRATORY_TRACT
  Filled 2022-08-06 (×4): qty 2

## 2022-08-06 MED ORDER — DILTIAZEM HCL-DEXTROSE 125-5 MG/125ML-% IV SOLN (PREMIX)
5.0000 mg/h | INTRAVENOUS | Status: DC
  Administered 2022-08-07: 5 mg/h via INTRAVENOUS
  Filled 2022-08-06: qty 125

## 2022-08-06 MED ORDER — ARFORMOTEROL TARTRATE 15 MCG/2ML IN NEBU
15.0000 ug | INHALATION_SOLUTION | Freq: Two times a day (BID) | RESPIRATORY_TRACT | Status: DC
  Administered 2022-08-07 – 2022-08-08 (×3): 15 ug via RESPIRATORY_TRACT
  Filled 2022-08-06 (×4): qty 2

## 2022-08-06 NOTE — Progress Notes (Signed)
PROGRESS NOTE        PATIENT DETAILS Name: Sonia [name] Age: 82 y.o. Sex: female Date of Birth: 02/03/1941 Admit Date: 08/04/2022 Admitting Physician Hillary Bow, DO ZOX:[id number], Jasmine December, MD  Brief Summary: Patient is a 82 y.o.  female with a history of steroid-dependent asthma/COPD, hypothyroidism who presented with weakness.  Per history obtained from family after admission-patient apparently had transient diarrheal illness-subsequently became weak-she was found to have asthma exacerbation along with hyponatremia/leukopenia/thrombocytopenia and subsequently admitted to the hospitalist service.    Significant events: 5/25>> admit to [facility]  Significant studies: 5/25>> CXR: No PNA.  Significant microbiology data: 5/25>> respiratory virus panel: Negative 5/25>> COVID PCR: Negative  Procedures: None  Consults: None  Subjective: Her mentation seems to have improved-she was much more fluent and answers-and answering more questions appropriately compared to yesterday..  Objective: Vitals: Blood pressure (!) 136/113, pulse 90, temperature 99.8 F (37.7 C), temperature source Oral, resp. rate (!) 23, height 5' 4.02" (1.626 m), weight 65 kg, SpO2 96 %.   Exam: Gen Exam:Alert awake-not in any distress HEENT:atraumatic, normocephalic Chest: B/L clear to auscultation anteriorly CVS:S1S2 regular Abdomen:soft non tender, non distended Extremities:no edema Neurology: Non focal Skin: no rash  Pertinent Labs/Radiology:    Latest Ref Rng & Units 08/06/2022    3:32 AM 08/05/2022    5:26 AM 08/04/2022    5:24 PM  CBC  WBC 4.0 - 10.5 K/uL 6.4  1.5    Hemoglobin 12.0 - 15.0 g/dL 09.8  11.9  14.7   Hematocrit 36.0 - 46.0 % 41.5  41.3  44.0   Platelets 150 - 400 K/uL 61  58      Lab Results  Component Value Date   NA 135 08/06/2022   K 3.5 08/06/2022   CL 107 08/06/2022   CO2 21 (L) 08/06/2022     Assessment/Plan: Acute metabolic  encephalopathy Likely secondary to dehydration/hyponatremia in the setting of recent diarrheal illness Seems to have significantly improved this morning-suspect close to baseline. Since improved-doubt further workup required.  Bronchial asthma with exacerbation Overall improved Taper prednisone further Continue bronchodilators  Recent gastroenteritis No further diarrhea/vomiting Seems to have resolved Likely the cause for electrolyte abnormalities and weakness/encephalopathy.  Hyponatremia Likely due to poor oral intake Resolved with IVF/supportive care Check electrolytes.  Likely.  Leukopenia/thrombocytopenia Unclear etiology Respiratory virus panel/COVID negative Suspect this could be a sequelae of her recent gastroenteritis-probably due to a viral syndrome. Thankfully leukopenia has resolved-continues to be thrombocytopenic Hematology following-workup in progress.   Hypothyroidism TSH only minimally elevated Continue current dosing of Synthroid and follow-up with PCP for further adjustment.    HLD Apparently no longer on statin or Zetia.  GERD Pepcid  BMI: Estimated body mass index is 24.58 kg/m as calculated from the following:   Height as of this encounter: 5' 4.02" (1.626 m).   Weight as of this encounter: 65 kg.   Code status:   Code Status: Full Code   DVT Prophylaxis: Place and maintain sequential compression device Start: 08/04/22 2010   Family Communication: Granddaughter-[phone]   Disposition Plan: Status is: Observation The patient will require care spanning > 2 midnights and should be moved to inpatient because: Severity of illness   Planned Discharge Destination:Home health versus SNF.   Diet: Diet Order             Diet  Heart Room service appropriate? Yes; Fluid consistency: Thin  Diet effective now                     Antimicrobial agents: Anti-infectives (From admission, onward)    None         MEDICATIONS: Scheduled Meds:  feeding supplement  1 Container Oral TID BM   levothyroxine  125 mcg Oral Q0600   mometasone-formoterol  2 puff Inhalation BID   multivitamin with minerals  1 tablet Oral Daily   predniSONE  40 mg Oral Q breakfast   Continuous Infusions:  sodium chloride 50 mL/hr at 08/05/22 1330   PRN Meds:.albuterol, albuterol, famotidine   I have personally reviewed following labs and imaging studies  LABORATORY DATA: CBC: Recent Labs  Lab 08/04/22 1711 08/04/22 1724 08/05/22 0526 08/06/22 0332  WBC 3.0*  --  1.5* 6.4  NEUTROABS 2.2  --   --  5.3  HGB 15.5* 15.0 14.1 14.3  HCT 47.0* 44.0 41.3 41.5  MCV 90.2  --  89.4 87.9  PLT 61*  --  58* 61*     Basic Metabolic Panel: Recent Labs  Lab 08/04/22 1711 08/04/22 1724 08/05/22 0016 08/05/22 0526 08/06/22 0332  NA 126* 130* 128* 134* 135  K 3.6 3.6 3.4* 3.9 3.5  CL 94*  --  99 104 107  CO2 19*  --  16* 18* 21*  GLUCOSE 92  --  157* 168* 105*  BUN 24*  --  24* 22 19  CREATININE 1.28*  --  1.20* 1.13* 0.86  CALCIUM 7.9*  --  7.8* 7.5* 7.8*  MG 2.1  --   --   --   --      GFR: Estimated Creatinine Clearance: 42.8 mL/min (by C-G formula based on SCr of 0.86 mg/dL).  Liver Function Tests: Recent Labs  Lab 08/04/22 1711 08/06/22 0332  AST 70* 56*  ALT 22 19  ALKPHOS 48 54  BILITOT 0.8 0.9  PROT 5.6* 5.1*  ALBUMIN 2.9* 2.6*    No results for input(s): "LIPASE", "AMYLASE" in the last 168 hours. Recent Labs  Lab 08/06/22 0332  AMMONIA 24    Coagulation Profile: No results for input(s): "INR", "PROTIME" in the last 168 hours.  Cardiac Enzymes: Recent Labs  Lab 08/04/22 1711  CKTOTAL 291*     BNP (last 3 results) No results for input(s): "PROBNP" in the last 8760 hours.  Lipid Profile: No results for input(s): "CHOL", "HDL", "LDLCALC", "TRIG", "CHOLHDL", "LDLDIRECT" in the last 72 hours.  Thyroid Function Tests: Recent Labs    08/04/22 1711  TSH 5.405*  FREET4  0.85     Anemia Panel: Recent Labs    08/06/22 0332  VITAMINB12 4,202*  FOLATE 24.0    Urine analysis:    Component Value Date/Time   COLORURINE YELLOW 08/05/2022 0616   APPEARANCEUR HAZY (A) 08/05/2022 0616   LABSPEC 1.013 08/05/2022 0616   PHURINE 5.0 08/05/2022 0616   GLUCOSEU NEGATIVE 08/05/2022 0616   GLUCOSEU NEGATIVE 11/28/2010 1422   HGBUR SMALL (A) 08/05/2022 0616   BILIRUBINUR NEGATIVE 08/05/2022 0616   KETONESUR 20 (A) 08/05/2022 0616   PROTEINUR NEGATIVE 08/05/2022 0616   UROBILINOGEN 0.2 11/28/2010 1422   NITRITE NEGATIVE 08/05/2022 0616   LEUKOCYTESUR NEGATIVE 08/05/2022 0616    Sepsis Labs: Lactic Acid, Venous    Component Value Date/Time   LATICACIDVEN 1.0 08/04/2022 1952    MICROBIOLOGY: Recent Results (from the past 240 hour(s))  Respiratory (~20 pathogens) panel by  PCR     Status: None   Collection Time: 08/04/22  9:54 PM   Specimen: Nasopharyngeal Swab; Respiratory  Result Value Ref Range Status   Adenovirus NOT DETECTED NOT DETECTED Final   Coronavirus 229E NOT DETECTED NOT DETECTED Final    Comment: (NOTE) The Coronavirus on the Respiratory Panel, DOES NOT test for the novel  Coronavirus (2019 nCoV)    Coronavirus HKU1 NOT DETECTED NOT DETECTED Final   Coronavirus NL63 NOT DETECTED NOT DETECTED Final   Coronavirus OC43 NOT DETECTED NOT DETECTED Final   Metapneumovirus NOT DETECTED NOT DETECTED Final   Rhinovirus / Enterovirus NOT DETECTED NOT DETECTED Final   Influenza A NOT DETECTED NOT DETECTED Final   Influenza B NOT DETECTED NOT DETECTED Final   Parainfluenza Virus 1 NOT DETECTED NOT DETECTED Final   Parainfluenza Virus 2 NOT DETECTED NOT DETECTED Final   Parainfluenza Virus 3 NOT DETECTED NOT DETECTED Final   Parainfluenza Virus 4 NOT DETECTED NOT DETECTED Final   Respiratory Syncytial Virus NOT DETECTED NOT DETECTED Final   Bordetella pertussis NOT DETECTED NOT DETECTED Final   Bordetella Parapertussis NOT DETECTED NOT DETECTED  Final   Chlamydophila pneumoniae NOT DETECTED NOT DETECTED Final   Mycoplasma pneumoniae NOT DETECTED NOT DETECTED Final    Comment: Performed at [facility], [address], [place], [place] [address]  SARS Coronavirus 2 by RT PCR (hospital order, performed in [facility] hospital lab) *cepheid single result test* Nasopharyngeal Swab     Status: None   Collection Time: 08/04/22  9:54 PM   Specimen: Nasopharyngeal Swab; Nasal Swab  Result Value Ref Range Status   SARS Coronavirus 2 by RT PCR NEGATIVE NEGATIVE Final    Comment: Performed at [facility], [address], [place], [place] [address]  MRSA Next Gen by PCR, Nasal     Status: None   Collection Time: 08/04/22  9:54 PM   Specimen: Nasopharyngeal Swab; Nasal Swab  Result Value Ref Range Status   MRSA by PCR Next Gen NOT DETECTED NOT DETECTED Final    Comment: (NOTE) The GeneXpert MRSA Assay (FDA approved for NASAL specimens only), is one component of a comprehensive MRSA colonization surveillance program. It is not intended to diagnose MRSA infection nor to guide or monitor treatment for MRSA infections. Test performance is not FDA approved in patients less than 28 years old. Performed at [facility], [address] [place], [place] [address]     RADIOLOGY STUDIES/RESULTS: DG Chest Port 1 View  Result Date: 08/04/2022 CLINICAL DATA:  Cough, shortness of breath, weakness EXAM: PORTABLE CHEST 1 VIEW COMPARISON:  04/08/2022 FINDINGS: Large hiatal hernia. Heart and mediastinal contours within normal limits. Aortic atherosclerosis. No confluent opacities or effusions. No acute bony abnormality. IMPRESSION: No active cardiopulmonary disease. Stable large hiatal hernia. Electronically Signed   By: Charlett Nose M.D.   On: 08/04/2022 17:32     LOS: 1 day   Jeoffrey Massed, MD  [facility]    To contact the attending provider between 7A-7P or the covering provider during after hours 7P-7A, please log  into the web site [email or web address] and access using universal [facility] password for that web site. If you do not have the password, please call the hospital operator.  08/06/2022, 11:35 AM

## 2022-08-06 NOTE — TOC Initial Note (Signed)
Transition of Care [facility]) - Initial/Assessment Note    Patient Details  Name: Sonia [name] MRN: [id number] Date of Birth: Dec 27, 1940  Transition of [facility]) CM/SW Contact:    Mearl Latin, LCSW Phone Number: 08/06/2022, 1:22 PM  Clinical Narrative:                 Patient sleeping during CSW's visit. CSW attempted to call Lauren, no voicemail available. Will follow up.   Expected Discharge Plan: Skilled Nursing Facility Barriers to Discharge: Continued Medical Work up, SNF Pending bed offer   Patient Goals and CMS Choice            Expected Discharge Plan and Services In-house Referral: Clinical Social Work     Living arrangements for the past 2 months: Single Family Home                                      Prior Living Arrangements/Services Living arrangements for the past 2 months: Single Family Home Lives with:: Self Patient language and need for interpreter reviewed:: Yes Do you feel safe going back to the place where you live?: Yes      Need for Family Participation in Patient Care: Yes (Comment) Care giver support system in place?: Yes (comment)   Criminal Activity/Legal Involvement Pertinent to Current Situation/Hospitalization: No - Comment as needed  Activities of Daily Living Home Assistive Devices/Equipment: None ADL Screening (condition at time of admission) Patient's cognitive ability adequate to safely complete daily activities?: Yes Is the patient deaf or have difficulty hearing?: No Does the patient have difficulty seeing, even when wearing glasses/contacts?: No Does the patient have difficulty concentrating, remembering, or making decisions?: No Patient able to express need for assistance with ADLs?: Yes Does the patient have difficulty dressing or bathing?: No Independently performs ADLs?: Yes (appropriate for developmental age) Does the patient have difficulty walking or climbing stairs?: No Weakness of Legs:  None Weakness of Arms/Hands: None  Permission Sought/Granted Permission sought to share information with : Facility Medical sales representative, Family Supports    Share Information with NAME: Leotis Shames        Permission granted to share info w Contact Information: [phone]  Emotional Assessment Appearance:: Appears stated age Attitude/Demeanor/Rapport: Unable to Assess Affect (typically observed): Unable to Assess Orientation: : Oriented to Self, Oriented to Place, Oriented to Situation Alcohol / Substance Use: Not Applicable Psych Involvement: No (comment)  Admission diagnosis:  Dehydration [E86.0] Hyponatremia [E87.1] 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 [J45.40, Z79.52] Hypothyroidism, unspecified type [E03.9] Patient Active Problem List   Diagnosis Date Noted   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 08/04/2022   Hyponatremia 08/04/2022   Thrombocytopenia (HCC) 08/04/2022   COPD with acute exacerbation (HCC) 03/11/2022   Non-STEMI (non-ST elevated myocardial infarction) (HCC) 03/11/2022   RSV (respiratory syncytial virus pneumonia) 03/11/2022   Acute bronchiolitis due to respiratory syncytial virus (RSV) 03/11/2022   Coronary artery disease of native artery of native heart with stable angina pectoris (HCC)    Asthma, chronic, moderate persistent, uncomplicated 02/03/2019   DOE (dyspnea on exertion) 07/31/2018   Osteopenia 06/04/2018   Carcinoma of upper-outer quadrant of right breast in female, estrogen receptor positive (HCC) 05/30/2018   GI bleed 07/25/2016   Melena 07/24/2016   Acute renal insufficiency 07/24/2016   CAD S/P percutaneous coronary angioplasty 07/16/2016   NSTEMI (non-ST elevated myocardial infarction) (HCC) 07/13/2016   Genetic  testing 06/09/2015   Family history of breast cancer in female 05/10/2015   History of melanoma 05/10/2015   Atypical lobular hyperplasia of left breast 04/26/2015   Vision problem  01/08/2014   Hiatus hernia syndrome 08/01/2012   Palpitations 10/18/2010   Acute chest pain 10/18/2010   Hypothyroidism 10/18/2010   Dyslipidemia 06/07/2009   Upper airway cough syndrome 03/29/2009   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   GERD 12/05/2006   PCP:  Mila Palmer, MD Pharmacy:   [facility] - [place], [place] - [address] [facility]. [address] [employer]. [address] [place] [address] Phone: [phone] Fax: [phone]  [facility] - [place], [place] - [address] [place] [address] Phone: [phone] Fax: [phone]  [facility] [address] [place] [address] Phone: [phone] Fax: [phone]     Social Determinants of Health (SDOH) Social History: SDOH Screenings   Food Insecurity: No Food Insecurity (08/04/2022)  Housing: Low Risk  (08/04/2022)  Transportation Needs: No Transportation Needs (08/04/2022)  Utilities: Not At Risk (08/04/2022)  Tobacco Use: Low Risk  (08/04/2022)   SDOH Interventions:     Readmission Risk Interventions     No data to display

## 2022-08-06 NOTE — Progress Notes (Signed)
  X-cover Note: Messaged by bedside RN due to tachycardia, possible rapid afib on telemetry.  EKG confirms rapid afib. Have sent stat BMP and Mag. Will replete K and Mg as needed.  Starting IV cardizem  CHADVASC score of 4. But pt is thrombocytopenic at 63K. Will hold off on systemic anticoagulation for now.     Carollee Herter, DO [facility]

## 2022-08-07 ENCOUNTER — Inpatient Hospital Stay (HOSPITAL_COMMUNITY): Payer: Medicare Other

## 2022-08-07 ENCOUNTER — Telehealth (HOSPITAL_BASED_OUTPATIENT_CLINIC_OR_DEPARTMENT_OTHER): Payer: Self-pay | Admitting: Physical Therapy

## 2022-08-07 DIAGNOSIS — D696 Thrombocytopenia, unspecified: Secondary | ICD-10-CM | POA: Diagnosis not present

## 2022-08-07 DIAGNOSIS — I4719 Other supraventricular tachycardia: Secondary | ICD-10-CM | POA: Diagnosis not present

## 2022-08-07 DIAGNOSIS — E039 Hypothyroidism, unspecified: Secondary | ICD-10-CM | POA: Diagnosis not present

## 2022-08-07 DIAGNOSIS — I4891 Unspecified atrial fibrillation: Secondary | ICD-10-CM | POA: Diagnosis not present

## 2022-08-07 DIAGNOSIS — E871 Hypo-osmolality and hyponatremia: Secondary | ICD-10-CM | POA: Diagnosis not present

## 2022-08-07 DIAGNOSIS — J4540 Moderate persistent asthma, uncomplicated: Secondary | ICD-10-CM | POA: Diagnosis not present

## 2022-08-07 LAB — ECHOCARDIOGRAM COMPLETE
AR max vel: 2.21 cm2
AV Peak grad: 3.9 mmHg
Ao pk vel: 0.99 m/s
Area-P 1/2: 3.91 cm2
Height: 64.016 in
MV VTI: 1.42 cm2
S' Lateral: 3 cm
Weight: 2292.78 oz

## 2022-08-07 LAB — CBC WITH DIFFERENTIAL/PLATELET
Abs Immature Granulocytes: 0.02 10*3/uL (ref 0.00–0.07)
Basophils Absolute: 0.1 10*3/uL (ref 0.0–0.1)
Basophils Relative: 1 %
Eosinophils Absolute: 0 10*3/uL (ref 0.0–0.5)
Eosinophils Relative: 0 %
HCT: 38.2 % (ref 36.0–46.0)
Hemoglobin: 13.2 g/dL (ref 12.0–15.0)
Immature Granulocytes: 0 %
Lymphocytes Relative: 50 %
Lymphs Abs: 4.1 10*3/uL — ABNORMAL HIGH (ref 0.7–4.0)
MCH: 30.3 pg (ref 26.0–34.0)
MCHC: 34.6 g/dL (ref 30.0–36.0)
MCV: 87.6 fL (ref 80.0–100.0)
Monocytes Absolute: 0.5 10*3/uL (ref 0.1–1.0)
Monocytes Relative: 6 %
Neutro Abs: 3.5 10*3/uL (ref 1.7–7.7)
Neutrophils Relative %: 43 %
Platelets: 72 10*3/uL — ABNORMAL LOW (ref 150–400)
RBC: 4.36 MIL/uL (ref 3.87–5.11)
RDW: 13.6 % (ref 11.5–15.5)
WBC: 8.2 10*3/uL (ref 4.0–10.5)
nRBC: 0 % (ref 0.0–0.2)

## 2022-08-07 LAB — BASIC METABOLIC PANEL
Anion gap: 8 (ref 5–15)
BUN: 18 mg/dL (ref 8–23)
CO2: 22 mmol/L (ref 22–32)
Calcium: 6.9 mg/dL — ABNORMAL LOW (ref 8.9–10.3)
Chloride: 105 mmol/L (ref 98–111)
Creatinine, Ser: 0.98 mg/dL (ref 0.44–1.00)
GFR, Estimated: 57 mL/min — ABNORMAL LOW (ref >60)
Glucose, Bld: 80 mg/dL (ref 70–99)
Potassium: 3.3 mmol/L — ABNORMAL LOW (ref 3.5–5.1)
Sodium: 135 mmol/L (ref 135–145)

## 2022-08-07 LAB — T4, FREE: Free T4: 1.22 ng/dL — ABNORMAL HIGH (ref 0.61–1.12)

## 2022-08-07 LAB — PATHOLOGIST SMEAR REVIEW

## 2022-08-07 LAB — KAPPA/LAMBDA LIGHT CHAINS
Kappa free light chain: 16.9 mg/L (ref 3.3–19.4)
Kappa, lambda light chain ratio: 0.93 (ref 0.26–1.65)
Lambda free light chains: 18.1 mg/L (ref 5.7–26.3)

## 2022-08-07 LAB — CORTISOL: Cortisol, Plasma: 9.3 ug/dL

## 2022-08-07 LAB — GLUCOSE, CAPILLARY: Glucose-Capillary: 73 mg/dL (ref 70–99)

## 2022-08-07 MED ORDER — POTASSIUM CHLORIDE CRYS ER 20 MEQ PO TBCR
40.0000 meq | EXTENDED_RELEASE_TABLET | Freq: Once | ORAL | Status: AC
  Administered 2022-08-07: 40 meq via ORAL
  Filled 2022-08-07: qty 2

## 2022-08-07 MED ORDER — DRONEDARONE HCL 400 MG PO TABS
400.0000 mg | ORAL_TABLET | Freq: Two times a day (BID) | ORAL | Status: DC
  Administered 2022-08-07 – 2022-08-08 (×2): 400 mg via ORAL
  Filled 2022-08-07 (×4): qty 1

## 2022-08-07 MED ORDER — PREDNISONE 20 MG PO TABS
20.0000 mg | ORAL_TABLET | Freq: Every day | ORAL | Status: DC
  Administered 2022-08-08: 20 mg via ORAL
  Filled 2022-08-07: qty 1

## 2022-08-07 NOTE — Consult Note (Addendum)
Cardiology Consultation   Patient ID: Sonia [name] MRN: [id number]; DOB: [birth year]  Admit date: 08/04/2022 Date of Consult: 08/07/2022  PCP:  Mila Palmer, MD   [facility] Providers Cardiologist:  Verne Carrow, MD   {  Patient Profile:   Sonia [name] is a 82 y.o. female with a hx of CAD status post DES RCA 2018, COPD, GERD, hypothyroidism, breast cancer, mild mitral stenosis who is being seen 08/07/2022 for the evaluation of new onset Afib RVR at the request of Dr. Jerral Ralph.  History of Present Illness:   [name] has history significant for coronary artery disease status post DES to the RCA in 2018.  She was discharged on Brilinta and readmitted a week later with a GI bleed and switch to Plavix with resolution of GI bleed.  She has a readmission for chest pain and dyspnea and a cath performed in February 2021 that indicated nonobstructive disease in the LAD and circumflex with patent stent in RCA with minimal restenosis. During another admission in December of 2023 she presented with dyspnea and elevated trops 1000+, but denied cardiac catherization.  Echo indicating normal LVEF greater than 75% with mild mitral stenosis. Currently she is followed by Dr. Clifton James and last seen in February 2024 with minor complaints of fatigue from RSV infection and occasional chest pressure.  She has refused Imdur and beta-blocker (hypotension and fatigue).   Patient admitted on 08/04/2022 with worsening complaints of fatigue, poor PO intake, recent gastroenteritis, hyponatremia, asthma/COPD exacerbation, metabolic encephalopathy that appears to be chronic, leukopenia/thrombocytopenia with unclear etiology thought to possibly be related to gastroenteritis, hemeonc following.  Respiratory panel has been negative.  Patient states that she felt weak and slumped to the floor and was unable to get up. Much of her issues have been felt secondary to her poor PO intake  and recent gastroenteritis. Cardiology has been consulted due to new onset of a brief run of paroxysmal atrial fibrillation with RVR and placed on IV Cardizem on 08/06/2022. She has not been on placed on anticoagulation due to thrombocytopenia.  Upon further review of EKG and telemetry it appears patient has been fluctuating between atrial fibrillation, sinus rhythm, MAT (multifocal atrial tachycardia). Does not appear to have any true Afib since last night. Heart rates have been persistently high generally between 80-120.  Patient generally unaware of her arrhythmia but does state that she has some palpitations that she feels in her neck.  She has had peripheral edema that she states that has been going on for about a year.  She denies any significant reduction in functional capacity. She lives alone and states that is able to complete her ADLs without interference but is not superactive but also not bedridden.  She denies any significant shortness of breath, cough, chest pain.  She reports some orthopnea that she feels is due to her asthma and fatigue.    Past Medical History:  Diagnosis Date   Arthritis    Asthma    daily and prn inhalers; states good control currently   Breast cancer (HCC)    right   CAD S/P percutaneous coronary angioplasty 07/15/2016   COPD (chronic obstructive pulmonary disease) (HCC)    Dyspnea    occ with exertion   Fibromyalgia    GERD (gastroesophageal reflux disease)    no current med.   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   Vitamin D deficiency     Past Surgical History:  Procedure Laterality Date   ABDOMINAL HYSTERECTOMY      partial   APPENDECTOMY     BREAST BIOPSY Left    BREAST CYST EXCISION Left    BREAST EXCISIONAL BIOPSY Left 03/2015   BREAST LUMPECTOMY Right 08/2018   BREAST LUMPECTOMY WITH RADIOACTIVE SEED LOCALIZATION Left 04/07/2015   Procedure: BREAST LUMPECTOMY WITH RADIOACTIVE SEED LOCALIZATION;  Surgeon: Harriette Bouillon, MD;  Location: [facility];  Service: General;  Laterality: Left;   BREAST LUMPECTOMY WITH RADIOACTIVE SEED LOCALIZATION Right 08/13/2018   Procedure: RIGHT BREAST LUMPECTOMY WITH RADIOACTIVE SEED LOCALIZATION;  Surgeon: Emelia Loron, MD;  Location: [facility];  Service: General;  Laterality: Right;   COLONOSCOPY     CORONARY STENT INTERVENTION N/A 07/15/2016   Procedure: Coronary Stent Intervention;  Surgeon: Tonny Bollman, MD;  Location: [facility] INVASIVE CV LAB;  Service: Cardiovascular;  Laterality: N/A;   ESOPHAGOGASTRODUODENOSCOPY (EGD) WITH PROPOFOL Left 07/25/2016   Procedure: ESOPHAGOGASTRODUODENOSCOPY (EGD) WITH PROPOFOL;  Surgeon: Kerin Salen, MD;  Location: [facility]COPY;  Service: Gastroenterology;  Laterality: Left;   KNEE ARTHROSCOPY Right 12/07/2003   LAPAROSCOPIC SALPINGO OOPHERECTOMY Right 09/08/2003   LEFT HEART CATH N/A 07/15/2016   Procedure: Left Heart Cath;  Surgeon: Tonny Bollman, MD;  Location: [facility] INVASIVE CV LAB;  Service: Cardiovascular;  Laterality: N/A;   LEFT HEART CATH AND CORONARY ANGIOGRAPHY N/A 04/14/2019   Procedure: LEFT HEART CATH AND CORONARY ANGIOGRAPHY;  Surgeon: Kathleene Hazel, MD;  Location: [facility] INVASIVE CV LAB;  Service: Cardiovascular;  Laterality: N/A;   LYSIS OF ADHESION  09/08/2003   NASAL SINUS SURGERY  age 14   RECTAL POLYPECTOMY  10/02/2006     Inpatient Medications: Scheduled Meds:  arformoterol  15 mcg Nebulization BID   budesonide (PULMICORT) nebulizer solution  0.25 mg Nebulization BID   feeding supplement  1 Container Oral TID BM   ipratropium-albuterol  3 mL Nebulization BID    levothyroxine  125 mcg Oral Q0600   multivitamin with minerals  1 tablet Oral Daily   [START ON 08/08/2022] predniSONE  20 mg Oral Q breakfast   Continuous Infusions:  sodium chloride 50 mL/hr at 08/06/22 2350   PRN Meds: albuterol, albuterol, famotidine  Allergies:    Allergies  Allergen Reactions   Adenocard [Adenosine] Anaphylaxis   Contrast Media [Iodinated Contrast Media] Anaphylaxis   Influenza Virus Vaccine Anaphylaxis   Macrobid [Nitrofurantoin] Itching   Milk (Cow) Other (See Comments)    Stomach upset   Breztri Aerosphere [Budeson-Glycopyrrol-Formoterol] Other (See Comments)    Urinary retention.   Codeine Sulfate Nausea And Vomiting   Latex Other (See Comments)    Skin irritation    Social History:   Social History   Socioeconomic History   Marital status: Divorced    Spouse name: Not on file   Number of children: 2   Years of education: Not on file   Highest education level: Not on file  Occupational History   Occupation: rn    Comment: working w/ Therapist, music as Sports administrator  Tobacco Use   Smoking status: Never    Passive exposure: Past   Smokeless tobacco:  Never  Vaping Use   Vaping Use: Never used  Substance and Sexual Activity   Alcohol use: Not Currently    Comment: rarely   Drug use: No   Sexual activity: Not Currently    Birth control/protection: Surgical    Comment: Hysterectomy  Other Topics Concern   Not on file  Social History Narrative   Not on file   Social Determinants of Health   Financial Resource Strain: Not on file  Food Insecurity: No Food Insecurity (08/04/2022)   Hunger Vital Sign    Worried About Running Out of Food in the Last Year: Never true    Ran Out of Food in the Last Year: Never true  Transportation Needs: No Transportation Needs (08/04/2022)   PRAPARE - Administrator, Civil Service (Medical): No    Lack of Transportation (Non-Medical): No  Physical Activity: Not on file  Stress: Not on  file  Social Connections: Not on file  Intimate Partner Violence: Not At Risk (08/04/2022)   Humiliation, Afraid, Rape, and Kick questionnaire    Fear of Current or Ex-Partner: No    Emotionally Abused: No    Physically Abused: No    Sexually Abused: No    Family History:   Family History  Problem Relation Age of Onset   Asthma Mother    Heart disease Mother    Asthma Sister    Heart disease Sister    Breast cancer Sister 57   Heart disease Father    Kidney disease Father    Prostate cancer Brother        dx. 50s   Heart attack Brother 36   Alzheimer's disease Brother 94   Breast cancer Maternal Aunt 62   Stomach cancer Maternal Grandfather 24   Asthma Sister    Memory loss Sister    Heart attack Son 69   Spinal muscular atrophy Grandchild        type II   Cancer Other 50       niece dx. ca of salivary gland    Breast cancer Other 69       niece; negative genetic testing 10 years ago   Melanoma Other    Alzheimer's disease Maternal Aunt    Cervical cancer Cousin        maternal 1st cousin dx. at 26   Breast cancer Cousin 73       maternal 1st cousin   Lung cancer Cousin 39       maternal 1st cousin; smoker   Heart disease Cousin    Leukemia Cousin 14       paternal 1st cousin   Breast cancer Other        paternal great grandmother (PGF's mother) dx. late 40s-early 64s   Coronary artery disease Other      ROS:  Please see the history of present illness.  All other ROS reviewed and negative.     Physical Exam/Data:   Vitals:   08/07/22 0852 08/07/22 0856 08/07/22 0858 08/07/22 1254  BP:      Pulse:      Resp:      Temp:      TempSrc:    Oral  SpO2: 95% 95% 95%   Weight:      Height:       No intake or output data in the 24 hours ending 08/07/22 1351    08/04/2022   11:25 PM 05/20/2022    9:53 PM 05/01/2022  3:59 PM  Last 3 Weights  Weight (lbs) 143 lb 4.8 oz 149 lb 149 lb  Weight (kg) 65 kg 67.586 kg 67.586 kg     Body mass index is 24.58  kg/m.  General:  Well nourished, well developed, in no acute distress HEENT: normal Neck: mild JVD Vascular: No carotid bruits; Distal pulses 2+ bilaterally Cardiac: Irregularly irregular no murmur.  Tachycardic Lungs:  clear to auscultation bilaterally, no wheezing, rhonchi or rales  Abd: soft, nontender, no hepatomegaly  Ext: no edema Musculoskeletal: Nonpitting edema  Skin: warm and dry  Neuro:  CNs 2-12 intact, no focal abnormalities noted Psych:  Normal affect   EKG:  The EKG was personally reviewed and demonstrates: Patient has had various EKGs. Initial tracing showed NSR with possible RAE, no acute STT changes. EKG last night showed Afib vs MAT, nonspecific STTW changes F/u EKG this AM showed NSR 69bpm with one PAC as well as one blocked PAC  Telemetry:  Telemetry was personally reviewed and demonstrates: Fluctuates between atrial fibrillation (5/27), multifocal atrial tachycardia with some return to sinus and atrial runs/PACs  Relevant CV Studies: Echocardiogram 03/11/2022   1. Left ventricular ejection fraction, by estimation, is >75%. The left  ventricle has hyperdynamic function. The left ventricle has no regional  wall motion abnormalities. Left ventricular diastolic parameters are  consistent with Grade I diastolic  dysfunction (impaired relaxation).   2. Right ventricular systolic function is normal. The right ventricular  size is normal. Tricuspid regurgitation signal is inadequate for assessing  PA pressure.   3. The mitral valve is degenerative. No evidence of mitral valve  regurgitation. Mild mitral stenosis. The mean mitral valve gradient is 4.5  mmHg.   4. The aortic valve is calcified. Aortic valve regurgitation is not  visualized. Aortic valve sclerosis/calcification is present, without any  evidence of aortic stenosis.   5. The inferior vena cava is normal in size with greater than 50%  respiratory variability, suggesting right atrial pressure of 3 mmHg.    Left heart catheterization 04/14/2019 Ost LM to LM lesion is 25% stenosed. Prox RCA to Mid RCA lesion is 10% stenosed. The left ventricular systolic function is normal. LV end diastolic pressure is normal. The left ventricular ejection fraction is greater than 65% by visual estimate. There is no mitral valve regurgitation. Prox LAD lesion is 50% stenosed.   1. Patent stent proximal to mid RCA with minimal restenosis 2. Moderate mid LAD stenosis, unchanged from last cath. This does not appear to be flow limiting.  3. Mild luminal irregularities in the moderate caliber, non-dominant Circumflex artery.  4. Normal LV systolic function   Recommendations: Continue medical management of CAD Laboratory Data:  High Sensitivity Troponin:   Recent Labs  Lab 08/04/22 1711  TROPONINIHS 28*     Chemistry Recent Labs  Lab 08/04/22 1711 08/04/22 1724 08/06/22 0332 08/06/22 2153 08/07/22 0827  NA 126*   < > 135 133* 135  K 3.6   < > 3.5 3.8 3.3*  CL 94*   < > 107 104 105  CO2 19*   < > 21* 20* 22  GLUCOSE 92   < > 105* 147* 80  BUN 24*   < > 19 24* 18  CREATININE 1.28*   < > 0.86 1.13* 0.98  CALCIUM 7.9*   < > 7.8* 7.6* 6.9*  MG 2.1  --   --  2.1  --   GFRNONAA 42*   < > >60 48* 57*  ANIONGAP  13   < > 7 9 8    < > = values in this interval not displayed.    Recent Labs  Lab 08/04/22 1711 08/06/22 0332  PROT 5.6* 5.1*  ALBUMIN 2.9* 2.6*  AST 70* 56*  ALT 22 19  ALKPHOS 48 54  BILITOT 0.8 0.9   Lipids No results for input(s): "CHOL", "TRIG", "HDL", "LABVLDL", "LDLCALC", "CHOLHDL" in the last 168 hours.  Hematology Recent Labs  Lab 08/06/22 0332 08/06/22 1505 08/07/22 0827  WBC 6.4 6.2 8.2  RBC 4.72 4.62 4.36  HGB 14.3 13.8 13.2  HCT 41.5 39.9 38.2  MCV 87.9 86.4 87.6  MCH 30.3 29.9 30.3  MCHC 34.5 34.6 34.6  RDW 13.3 13.3 13.6  PLT 61* 63* 72*   Thyroid  Recent Labs  Lab 08/04/22 1711  TSH 5.405*  FREET4 0.85    BNP Recent Labs  Lab 08/04/22 1711  BNP  16.6    DDimer No results for input(s): "DDIMER" in the last 168 hours.   Radiology/Studies:  DG Chest Port 1 View  Result Date: 08/04/2022 CLINICAL DATA:  Cough, shortness of breath, weakness EXAM: PORTABLE CHEST 1 VIEW COMPARISON:  04/08/2022 FINDINGS: Large hiatal hernia. Heart and mediastinal contours within normal limits. Aortic atherosclerosis. No confluent opacities or effusions. No acute bony abnormality. IMPRESSION: No active cardiopulmonary disease. Stable large hiatal hernia. Electronically Signed   By: Charlett Nose M.D.   On: 08/04/2022 17:32     Assessment and Plan:   Atrial fibrillation RVR, multifocal atrial tachycardia Patient has been fluctuating between various atrial arrhythmias. She did have transient atrial fibrillation yesterday, but  primary arrhythmia appears to be multifocal atrial tachycardia as well as NSR with PACs/short atrial runs. Heart rates have been elevated currently in the 110-120s.  She was placed on IV Cardizem and has been hypotensive with blood pressures in the 70s systolic.  Patient is generally unaware of her arrhythmia and has minor complaints of palpitations.  Denies any shortness of breath.Given her weakness/hypotension with uncontrolled rates throughout this admission would not continue IV Cardizem (this has been stopped).  Previously has had issues with fatigue on beta-blocker.  Given underlying COPD exacerbation may spontaneously resolve.  Patient with documented anaphylactic reaction to iodine in chart, amiodarone not an option.  Will consult with MD.  Defer anticoagulation in the setting of thrombocytopenia Primary team notified of hypotension. Will increase IVF. Ordering cortisol   HFpEF?   Given present symptoms (fatigue, peripheral edema, mild JVD) and evidence of hypervolemia on physical exam question whether patient has had reduced function.  No evidence of pulmonary congestion.  Echocardiogram pending. BNP 16.6. Defer lasix for now given  hypotension and need for rate control.   Hypokalemia Will order supplementation. 3.3  Coronary artery disease status post stent to RCA in 2018 Overall stable without any anginal complaints.  Last catheterization in 2021 indicated minimal restenosis of RCA, but patent. Moderate non flow limiting stenosis of LAD. Troponins mildly elevated at 24.  Hyperlipidemia Patient is supposed to be on Zetia 10 mg and rosuvastatin 5 mg.  Does not report that she is taking.  LDL 137.  Need to discuss further about medical management.  Bronchial asthma exacerbation Recent gastroenteritis Hyponatremia Leukopenia/thrombocytopenia. Hypothyroidism    Risk Assessment/Risk Scores:   New York Heart Association (NYHA) Functional Class NYHA Class II  CHA2DS2-VASc Score = 4  This indicates a 4.8% annual risk of stroke. The patient's score is based upon: CHF History: 0 HTN History: 0 Diabetes History:  0 Stroke History: 0 Vascular Disease History: 1 Age Score: 2 Gender Score: 1   For questions or updates, please contact [facility] Please consult [email or web address] for contact info under    Signed, Abagail Kitchens, PA-C  08/07/2022 1:51 PM

## 2022-08-07 NOTE — TOC Progression Note (Signed)
Transition of Care [facility]) - Progression Note    Patient Details  Name: Sonia [name] MRN: [id number] Date of Birth: 1940/07/02  Transition of Care [facility]) CM/SW Contact  Mearl Latin, LCSW Phone Number: 08/07/2022, 1:48 PM  Clinical Narrative:    CSW received consult for possible SNF placement at time of discharge. CSW spoke with patient. Patient initially asked if she could return home with home health instead of SNF. CSW explained the difference between home health and SNF so patient would be aware that home health hours are limited. Patient expressed understanding of PT recommendation and is agreeable to SNF placement at time of discharge. Patient reports preference for [facility]. CSW discussed insurance process and provided Medicare SNF ratings list. CSW will send out referrals for review and provide bed offers as available.   Skilled Nursing Rehab Facilities-   [email or web address]   Ratings out of 5 stars (5 the highest)   Name Address  Phone # Quality Care Staffing Health Inspection Overall  [facility] [address], [place] [phone] 2 1 5 4   [facility] [address], [place] [phone] 4 1 3 2   [facility] [address], [facility] [phone] [facility] [address], [place] [phone] 4 1 3 2   [facility]  [address], [place] [phone] 3 2 5 5   [facility] [address] [facility] [phone] 2 1 2 1   [facility] [address] [place] [phone] 4 1 2 1   [facility] [address] [place] [phone] 2 4 3 3   [address] ([facility]) [address] [place] [phone] 3 2 2 2   [facility] [address] [place], [place] [phone] 1 2 1 1   [facility] [address], [place] [phone] 3 1 1 1   [facility] [address] [place] [phone] 4 3 4 4    [facility] [address], [place] [phone] 3 4 3 3           [facility] [address] [place] [phone]      [facility], [place] [address], [place] [phone] 1 1 2 1   [facility] [address] [employer] [phone] 2 2 4 4   [facility] [address] [phone] 2 1 4 3   [facility] [address] [place] [phone] 3 3 3 3           [address] (no [facility]) [address], [place] [phone] 4 4 5 5   [facility] (No [employer]) [address] [place] [place], [place] [phone] 2 2 4 4   [facility] [address], [place] [phone] 2 1 2 1   [facility] (No [employer]) [address], [place] [phone] 5 5 5 5   [facility] [address], [facility] [phone] 2 3 5 5   [facility] [address], [place] [phone] 2 1 1 1   [facility] [address] [place] [phone] 5 2 5 5   [facility]  [address], [place] [phone] 2 2 2 2   [facility] [address] [place] [phone] 4 2 1 1   [facility] [address] [place], [place] [phone] 2 2 3 3           [facility] [address], [place] [phone] 1 1 1 1   [facility] [address] [place] Clines  [phone] 2 3 3 3   [facility] (No [employer]) [address], [place] [phone] 2 1 3 2   [facility]) [address], [place] [phone] 1 1 1 1   [facility] [address] [place] [phone] 3 2 5 5   [facility] [address], [place] [phone] 2 1 1 1           [facility] [address] [place], [place] [phone] 4 4 5 5   [facility])  [address] [place] [phone] 2 1 2 1   [facility]) [address], [place] [phone]  1 4 3   [facility] [place] [address] [place] [phone] 3 5 4 5   [address] [place], [place]  [phone] 3 2 2 2   [facility]) [address] [place] [phone] 2 1 3 2       Expected Discharge Plan: Skilled Nursing Facility Barriers to Discharge: Continued Medical Work up, SNF Pending bed offer  Expected Discharge Plan and Services In-house Referral: Clinical Social Work   Post Acute Care Choice: Skilled Nursing Facility Living arrangements for the past 2 months: Single Family Home                                       Social Determinants of Health (SDOH) Interventions SDOH Screenings   Food Insecurity: No Food Insecurity (08/04/2022)  Housing: Low Risk  (08/04/2022)  Transportation Needs: No Transportation Needs (08/04/2022)  Utilities: Not At Risk (08/04/2022)  Tobacco Use: Low Risk  (08/04/2022)    Readmission Risk Interventions     No data to display

## 2022-08-07 NOTE — Progress Notes (Signed)
Physical Therapy Treatment Patient Details Name: Sonia [name] MRN: [id number] DOB: 24-Jul-1940 Today's Date: 08/07/2022   History of Present Illness Pt is an 82 year old woman admitted on 5/25 with weakness x 1 week, poor appetite and a fall from the edge of her bed. + bronchial asthma exacerbation, hyponatremia, dehydration, leukopenia, thrombocytopenia. PMH: asthma and COPD--steroid dependent, hypothyroidism, HLD, CAD, fibromyalgia, breast cancer.    PT Comments    Pt greeted resting in bed and agreeable to session with continued progress towards acute goals. Pt requiring grossly min A for functional transfers and gait with RW for support, however pt limited decreased insight into deficits and need for increased cues for safety awareness as pt drifting L in hall, bumping into obstacles on L and needing cues to move to the R to become "unstuck" as pt unable to problem solve on own. Pt was educated on continued walker use to maximize functional independence, safety, and decrease risk for falls. Current plan remains appropriate to address deficits and maximize functional independence and decrease caregiver burden. Pt continues to benefit from skilled PT services to progress toward functional mobility goals.    Recommendations for follow up therapy are one component of a multi-disciplinary discharge planning process, led by the attending physician.  Recommendations may be updated based on patient status, additional functional criteria and insurance authorization.  Follow Up Recommendations  Can patient physically be transported by private vehicle: Yes    Assistance Recommended at Discharge Intermittent Supervision/Assistance  Patient can return home with the following A little help with walking and/or transfers;Assistance with cooking/housework;Assist for transportation;Help with stairs or ramp for entrance;A little help with bathing/dressing/bathroom   Equipment Recommendations  None  recommended by PT    Recommendations for Other Services       Precautions / Restrictions Precautions Precautions: Fall Restrictions Weight Bearing Restrictions: No     Mobility  Bed Mobility Overal bed mobility: Needs Assistance Bed Mobility: Supine to Sit     Supine to sit: Supervision     General bed mobility comments: increased time    Transfers Overall transfer level: Needs assistance Equipment used: Rolling walker (2 wheels) Transfers: Sit to/from Stand Sit to Stand: Min assist           General transfer comment: help to balance and cues for hand placement    Ambulation/Gait Ambulation/Gait assistance: Min guard, Min assist Gait Distance (Feet): 200 Feet Assistive device: Rolling walker (2 wheels) Gait Pattern/deviations: Step-through pattern, Decreased stride length, Wide base of support Gait velocity: decr     General Gait Details: slower pace, driftin L throughout and getting RW "caught" on objects on L, needing cues to move to R to avoid further obstacles as opposed to lifting RW up and over.   Stairs             Wheelchair Mobility    Modified Rankin (Stroke Patients Only)       Balance Overall balance assessment: Needs assistance   Sitting balance-Leahy Scale: Good       Standing balance-Leahy Scale: Poor Standing balance comment: UE support needed for balance                            Cognition Arousal/Alertness: Awake/alert Behavior During Therapy: WFL for tasks assessed/performed, Anxious Overall Cognitive Status: Impaired/Different from baseline Area of Impairment: Memory, Safety/judgement  Memory: Decreased short-term memory   Safety/Judgement: Decreased awareness of deficits, Decreased awareness of safety     General Comments: pt with decreased inisght into deficits, stating she feels safe to d/c home despite poor balance and obvious difficultied negotiating obstacles in  hall        Exercises      General Comments General comments (skin integrity, edema, etc.): HR up to 132bpm non sustained      Pertinent Vitals/Pain Pain Assessment Pain Assessment: Faces Faces Pain Scale: Hurts little more Pain Location: generalized Pain Descriptors / Indicators: Discomfort Pain Intervention(s): Monitored during session    Home Living                          Prior Function            PT Goals (current goals can now be found in the care plan section) Acute Rehab PT Goals Patient Stated Goal: to return to independent PT Goal Formulation: With patient Time For Goal Achievement: 08/19/22 Progress towards PT goals: Progressing toward goals    Frequency    Min 3X/week      PT Plan      Co-evaluation              AM-PAC PT "6 Clicks" Mobility   Outcome Measure  Help needed turning from your back to your side while in a flat bed without using bedrails?: A Little Help needed moving from lying on your back to sitting on the side of a flat bed without using bedrails?: A Little Help needed moving to and from a bed to a chair (including a wheelchair)?: A Little Help needed standing up from a chair using your arms (e.g., wheelchair or bedside chair)?: A Little Help needed to walk in hospital room?: A Little Help needed climbing 3-5 steps with a railing? : Total 6 Click Score: 16    End of Session Equipment Utilized During Treatment: Gait belt Activity Tolerance: Patient tolerated treatment well Patient left: with call bell/phone within reach;in chair;with chair alarm set Nurse Communication: Mobility status PT Visit Diagnosis: Other abnormalities of gait and mobility (R26.89);Muscle weakness (generalized) (M62.81);Other symptoms and signs involving the nervous system (Z61.096)     Time: 0454-0981 PT Time Calculation (min) (ACUTE ONLY): 38 min  Charges:  $Gait Training: 23-37 mins $Therapeutic Activity: 8-22 mins                      Akoni Parton R. PTA Acute Rehabilitation Services Office: [phone]   Catalina Antigua 08/07/2022, 12:56 PM

## 2022-08-07 NOTE — Progress Notes (Signed)
Echocardiogram 2D Echocardiogram has been performed.  Sonia [name] 08/07/2022, 3:40 PM

## 2022-08-07 NOTE — Progress Notes (Signed)
OT Cancellation Note  Patient Details Name: Sonia [name] MRN: [id number] DOB: 22-May-1940   Cancelled Treatment:    Reason Eval/Treat Not Completed: Patient at procedure or test/ unavailable (Made second attempt to see pt for skilled OT session. However, pt was receiving echocardiogram. OT to attempt to see pt later this day as pt is able to participate and as schedules allow.)  Tasheka Houseman "Ronaldo Miyamoto" M., OTR/L, MA Acute Rehab [phone]   Lendon Colonel 08/07/2022, 3:31 PM

## 2022-08-07 NOTE — Progress Notes (Addendum)
PROGRESS NOTE        PATIENT DETAILS Name: Sonia [name] Age: 82 y.o. Sex: female Date of Birth: 01-03-1941 Admit Date: 08/04/2022 Admitting Physician Hillary Bow, DO ZOX:WRUEAVW, Jasmine December, MD  Brief Summary: Patient is a 82 y.o.  female with a history of steroid-dependent asthma/COPD, hypothyroidism who presented with weakness.  Per history obtained from family after admission-patient apparently had transient diarrheal illness-subsequently became weak-she was found to have asthma exacerbation along with hyponatremia/leukopenia/thrombocytopenia and subsequently admitted to the hospitalist service.    Significant events: 5/25>> admit to [facility] 5/27>> run of A-fib with RVR  Significant studies: 5/25>> CXR: No PNA.  Significant microbiology data: 5/25>> respiratory virus panel: Negative 5/25>> COVID PCR: Negative  Procedures: None  Consults: None  Subjective: Just woke up-still sleepy-no major issues overnight.  No diarrhea.  Still feels weak.  Somewhat slow to answer questions compared to yesterday.  Objective: Vitals: Blood pressure (!) 107/46, pulse 79, temperature 98 F (36.7 C), temperature source Oral, resp. rate 18, height 5' 4.02" (1.626 m), weight 65 kg, SpO2 95 %.   Exam: Gen Exam:Alert awake-not in any distress HEENT:atraumatic, normocephalic Chest: B/L clear to auscultation anteriorly CVS:S1S2 regular Abdomen:soft non tender, non distended Extremities:no edema Neurology: Non focal Skin: no rash  Pertinent Labs/Radiology:    Latest Ref Rng & Units 08/07/2022    8:27 AM 08/06/2022    3:05 PM 08/06/2022    3:32 AM  CBC  WBC 4.0 - 10.5 K/uL 8.2  6.2  6.4   Hemoglobin 12.0 - 15.0 g/dL 09.8  11.9  14.7   Hematocrit 36.0 - 46.0 % 38.2  39.9  41.5   Platelets 150 - 400 K/uL 72  63  61     Lab Results  Component Value Date   NA 135 08/07/2022   K 3.3 (L) 08/07/2022   CL 105 08/07/2022   CO2 22 08/07/2022      Assessment/Plan: Acute metabolic encephalopathy Likely secondary to dehydration/hyponatremia in the setting of recent diarrheal illness Slowly improving-however although better than how she first presented-it appears that she has problems recalling things.  Spoke to her granddaughter Lauren-this has apparently been going on for several months-suspect she may have some amount of cognitive issues at baseline.  Suspect further workup could be done in the outpatient setting as this seems to be a chronic issue-probably slightly worse than usual due to her acute hospitalization. Maintain delirium precautions  Bronchial asthma with exacerbation Significantly better Continue to taper prednisone Continue bronchodilators.    Recent gastroenteritis No further diarrhea/vomiting Seems to have resolved Likely the cause for electrolyte abnormalities and weakness/encephalopathy.  Hyponatremia Likely due to poor oral intake Resolved with IVF/supportive care  Leukopenia/thrombocytopenia Unclear etiology Respiratory virus panel/COVID negative Suspect this could be a sequelae of her recent gastroenteritis-probably due to a viral syndrome. Leukopenia resolved-thrombocytopenia slowly improving.    PAF with RVR Run of RVR overnight Check echo Continue cadizem gtt for now Reviewed outpatient cardiology note-did not tolerate beta-blocker in the past.  Patient is very particular about her medications-will consult cardiology to see if medication changes are needed.  Holding off on anticoagulation at this point secondary to resolving thrombocytopenia.   Hypothyroidism TSH only minimally elevated Continue current dosing of Synthroid and follow-up with PCP for further adjustment.    HLD Apparently no longer on statin or Zetia.  GERD  Pepcid  Debility/deconditioning Due to acute illness PT/OT eval-SNF recommended-social worker following and making SNF arrangements.  BMI: Estimated body mass index  is 24.58 kg/m as calculated from the following:   Height as of this encounter: 5' 4.02" (1.626 m).   Weight as of this encounter: 65 kg.   Code status:   Code Status: Full Code   DVT Prophylaxis: Place and maintain sequential compression device Start: 08/04/22 2010   Family Communication: Granddaughter-Lauren-[phone]-on 5/28   Disposition Plan: Status is: Observation The patient will require care spanning > 2 midnights and should be moved to inpatient because: Severity of illness   Planned Discharge DestinationSNF.   Diet: Diet Order             Diet Heart Room service appropriate? Yes; Fluid consistency: Thin  Diet effective now                     Antimicrobial agents: Anti-infectives (From admission, onward)    None        MEDICATIONS: Scheduled Meds:  arformoterol  15 mcg Nebulization BID   budesonide (PULMICORT) nebulizer solution  0.25 mg Nebulization BID   feeding supplement  1 Container Oral TID BM   ipratropium-albuterol  3 mL Nebulization BID   levothyroxine  125 mcg Oral Q0600   multivitamin with minerals  1 tablet Oral Daily   predniSONE  30 mg Oral Q breakfast   Continuous Infusions:  sodium chloride 50 mL/hr at 08/06/22 2350   PRN Meds:.albuterol, albuterol, famotidine   I have personally reviewed following labs and imaging studies  LABORATORY DATA: CBC: Recent Labs  Lab 08/04/22 1711 08/04/22 1724 08/05/22 0526 08/06/22 0332 08/06/22 1505 08/07/22 0827  WBC 3.0*  --  1.5* 6.4 6.2 8.2  NEUTROABS 2.2  --   --  5.3 5.4 3.5  HGB 15.5* 15.0 14.1 14.3 13.8 13.2  HCT 47.0* 44.0 41.3 41.5 39.9 38.2  MCV 90.2  --  89.4 87.9 86.4 87.6  PLT 61*  --  58* 61* 63* 72*     Basic Metabolic Panel: Recent Labs  Lab 08/04/22 1711 08/04/22 1724 08/05/22 0016 08/05/22 0526 08/06/22 0332 08/06/22 2153 08/07/22 0827  NA 126*   < > 128* 134* 135 133* 135  K 3.6   < > 3.4* 3.9 3.5 3.8 3.3*  CL 94*  --  99 104 107 104 105  CO2  19*  --  16* 18* 21* 20* 22  GLUCOSE 92  --  157* 168* 105* 147* 80  BUN 24*  --  24* 22 19 24* 18  CREATININE 1.28*  --  1.20* 1.13* 0.86 1.13* 0.98  CALCIUM 7.9*  --  7.8* 7.5* 7.8* 7.6* 6.9*  MG 2.1  --   --   --   --  2.1  --    < > = values in this interval not displayed.     GFR: Estimated Creatinine Clearance: 37.6 mL/min (by C-G formula based on SCr of 0.98 mg/dL).  Liver Function Tests: Recent Labs  Lab 08/04/22 1711 08/06/22 0332  AST 70* 56*  ALT 22 19  ALKPHOS 48 54  BILITOT 0.8 0.9  PROT 5.6* 5.1*  ALBUMIN 2.9* 2.6*    No results for input(s): "LIPASE", "AMYLASE" in the last 168 hours. Recent Labs  Lab 08/06/22 0332  AMMONIA 24     Coagulation Profile: No results for input(s): "INR", "PROTIME" in the last 168 hours.  Cardiac Enzymes: Recent Labs  Lab 08/04/22  1711  CKTOTAL 291*     BNP (last 3 results) No results for input(s): "PROBNP" in the last 8760 hours.  Lipid Profile: No results for input(s): "CHOL", "HDL", "LDLCALC", "TRIG", "CHOLHDL", "LDLDIRECT" in the last 72 hours.  Thyroid Function Tests: Recent Labs    08/04/22 1711  TSH 5.405*  FREET4 0.85     Anemia Panel: Recent Labs    08/06/22 0332  VITAMINB12 4,202*  FOLATE 24.0     Urine analysis:    Component Value Date/Time   COLORURINE YELLOW 08/05/2022 0616   APPEARANCEUR HAZY (A) 08/05/2022 0616   LABSPEC 1.013 08/05/2022 0616   PHURINE 5.0 08/05/2022 0616   GLUCOSEU NEGATIVE 08/05/2022 0616   GLUCOSEU NEGATIVE 11/28/2010 1422   HGBUR SMALL (A) 08/05/2022 0616   BILIRUBINUR NEGATIVE 08/05/2022 0616   KETONESUR 20 (A) 08/05/2022 0616   PROTEINUR NEGATIVE 08/05/2022 0616   UROBILINOGEN 0.2 11/28/2010 1422   NITRITE NEGATIVE 08/05/2022 0616   LEUKOCYTESUR NEGATIVE 08/05/2022 0616    Sepsis Labs: Lactic Acid, Venous    Component Value Date/Time   LATICACIDVEN 1.0 08/04/2022 1952    MICROBIOLOGY: Recent Results (from the past 240 hour(s))  Respiratory  (~20 pathogens) panel by PCR     Status: None   Collection Time: 08/04/22  9:54 PM   Specimen: Nasopharyngeal Swab; Respiratory  Result Value Ref Range Status   Adenovirus NOT DETECTED NOT DETECTED Final   Coronavirus 229E NOT DETECTED NOT DETECTED Final    Comment: (NOTE) The Coronavirus on the Respiratory Panel, DOES NOT test for the novel  Coronavirus (2019 nCoV)    Coronavirus HKU1 NOT DETECTED NOT DETECTED Final   Coronavirus NL63 NOT DETECTED NOT DETECTED Final   Coronavirus OC43 NOT DETECTED NOT DETECTED Final   Metapneumovirus NOT DETECTED NOT DETECTED Final   Rhinovirus / Enterovirus NOT DETECTED NOT DETECTED Final   Influenza A NOT DETECTED NOT DETECTED Final   Influenza B NOT DETECTED NOT DETECTED Final   Parainfluenza Virus 1 NOT DETECTED NOT DETECTED Final   Parainfluenza Virus 2 NOT DETECTED NOT DETECTED Final   Parainfluenza Virus 3 NOT DETECTED NOT DETECTED Final   Parainfluenza Virus 4 NOT DETECTED NOT DETECTED Final   Respiratory Syncytial Virus NOT DETECTED NOT DETECTED Final   Bordetella pertussis NOT DETECTED NOT DETECTED Final   Bordetella Parapertussis NOT DETECTED NOT DETECTED Final   Chlamydophila pneumoniae NOT DETECTED NOT DETECTED Final   Mycoplasma pneumoniae NOT DETECTED NOT DETECTED Final    Comment: Performed at [facility], [address], [place], [place] [address]  SARS Coronavirus 2 by RT PCR (hospital order, performed in [facility] hospital lab) *cepheid single result test* Nasopharyngeal Swab     Status: None   Collection Time: 08/04/22  9:54 PM   Specimen: Nasopharyngeal Swab; Nasal Swab  Result Value Ref Range Status   SARS Coronavirus 2 by RT PCR NEGATIVE NEGATIVE Final    Comment: Performed at [facility], [address] [place], [place] [address]  MRSA Next Gen by PCR, Nasal     Status: None   Collection Time: 08/04/22  9:54 PM   Specimen: Nasopharyngeal Swab; Nasal Swab  Result Value Ref Range Status   MRSA by PCR Next  Gen NOT DETECTED NOT DETECTED Final    Comment: (NOTE) The GeneXpert MRSA Assay (FDA approved for NASAL specimens only), is one component of a comprehensive MRSA colonization surveillance program. It is not intended to diagnose MRSA infection nor to guide or monitor  treatment for MRSA infections. Test performance is not FDA approved in patients less than 64 years old. Performed at [facility], [address], [place], [place] [address]     RADIOLOGY STUDIES/RESULTS: No results found.   LOS: 2 days   Jeoffrey Massed, MD  [facility]    To contact the attending provider between 7A-7P or the covering provider during after hours 7P-7A, please log into the web site [email or web address] and access using universal [place] password for that web site. If you do not have the password, please call the hospital operator.  08/07/2022, 12:06 PM

## 2022-08-07 NOTE — [place] FL2 (Signed)
[place] MEDICAID FL2 LEVEL OF CARE FORM     IDENTIFICATION  Patient Name: Sonia [name] Birthdate: 18-Nov-1940 Sex: female Admission Date (Current Location): 08/04/2022  [facility] and [place] Number:  Producer, television/film/video and Address:  [facility] [place], [address] [place], [place] [address]      Provider Number: [id number]  Attending Physician Name and Address:  Maretta Bees, MD  Relative Name and Phone Number:       Current Level of Care: Hospital Recommended Level of Care: Skilled Nursing Facility Prior Approval Number:    Date Approved/Denied:   PASRR Number: [id number]  Discharge Plan: SNF    Current Diagnoses: Patient Active Problem List   Diagnosis Date Noted   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 08/04/2022   Hyponatremia 08/04/2022   Thrombocytopenia (HCC) 08/04/2022   COPD with acute exacerbation (HCC) 03/11/2022   Non-STEMI (non-ST elevated myocardial infarction) (HCC) 03/11/2022   RSV (respiratory syncytial virus pneumonia) 03/11/2022   Acute bronchiolitis due to respiratory syncytial virus (RSV) 03/11/2022   Coronary artery disease of native artery of native heart with stable angina pectoris (HCC)    Asthma, chronic, moderate persistent, uncomplicated 02/03/2019   DOE (dyspnea on exertion) 07/31/2018   Osteopenia 06/04/2018   Carcinoma of upper-outer quadrant of right breast in female, estrogen receptor positive (HCC) 05/30/2018   GI bleed 07/25/2016   Melena 07/24/2016   Acute renal insufficiency 07/24/2016   CAD S/P percutaneous coronary angioplasty 07/16/2016   NSTEMI (non-ST elevated myocardial infarction) (HCC) 07/13/2016   Genetic testing 06/09/2015   Family history of breast cancer in female 05/10/2015   History of melanoma 05/10/2015   Atypical lobular hyperplasia of left breast 04/26/2015   Vision problem 01/08/2014   Hiatus hernia syndrome 08/01/2012   Palpitations 10/18/2010    Acute chest pain 10/18/2010   Hypothyroidism 10/18/2010   Dyslipidemia 06/07/2009   Upper airway cough syndrome 03/29/2009   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   GERD 12/05/2006    Orientation RESPIRATION BLADDER Height & Weight     Self, Time, Situation, Place  Normal Continent Weight: 143 lb 4.8 oz (65 kg) Height:  5' 4.02" (162.6 cm)  BEHAVIORAL SYMPTOMS/MOOD NEUROLOGICAL BOWEL NUTRITION STATUS      Continent Diet (See dc summary)  AMBULATORY STATUS COMMUNICATION OF NEEDS Skin   Limited Assist Verbally Normal                       Personal Care Assistance Level of Assistance  Bathing, Feeding, Dressing Bathing Assistance: Limited assistance Feeding assistance: Independent Dressing Assistance: Limited assistance     Functional Limitations Info             SPECIAL CARE FACTORS FREQUENCY  PT (By licensed PT), OT (By licensed OT)     PT Frequency: 5x/week OT Frequency: 5x/week            Contractures Contractures Info: Not present    Additional Factors Info  Code Status, Allergies Code Status Info: Full Allergies Info: Adenocard (Adenosine), Contrast Media (Iodinated Contrast Media), Influenza Virus Vaccine, Macrobid (Nitrofurantoin), Milk (Cow), Breztri Aerosphere (Budeson-glycopyrrol-formoterol), Codeine Sulfate, Latex           Current Medications (08/07/2022):  This is the current hospital active medication list Current Facility-Administered Medications  Medication Dose Route Frequency Provider Last Rate Last Admin   0.9 %  sodium chloride infusion   Intravenous Continuous Ghimire, Werner Lean, MD 50 mL/hr at 08/06/22  2350 New Bag at 08/06/22 2350   albuterol (PROVENTIL) (2.5 MG/3ML) 0.083% nebulizer solution 2.5 mg  2.5 mg Nebulization Q6H PRN Hillary Bow, DO       albuterol (VENTOLIN HFA) 108 (90 Base) MCG/ACT inhaler 2 puff  2 puff Inhalation Q4H PRN Maretta Bees, MD   2 puff at 08/06/22 2025   arformoterol (BROVANA) nebulizer solution 15  mcg  15 mcg Nebulization BID Maretta Bees, MD   15 mcg at 08/07/22 0852   budesonide (PULMICORT) nebulizer solution 0.25 mg  0.25 mg Nebulization BID Maretta Bees, MD   0.25 mg at 08/07/22 0858   famotidine (PEPCID) tablet 20 mg  20 mg Oral Daily PRN Hillary Bow, DO       feeding supplement (BOOST / RESOURCE BREEZE) liquid 1 Container  1 Container Oral TID BM Maretta Bees, MD   1 Container at 08/07/22 0915   ipratropium-albuterol (DUONEB) 0.5-2.5 (3) MG/3ML nebulizer solution 3 mL  3 mL Nebulization BID Maretta Bees, MD   3 mL at 08/07/22 0856   levothyroxine (SYNTHROID) tablet 125 mcg  125 mcg Oral Q0600 Hillary Bow, DO   125 mcg at 08/07/22 4098   multivitamin with minerals tablet 1 tablet  1 tablet Oral Daily Maretta Bees, MD   1 tablet at 08/07/22 0915   [START ON 08/08/2022] predniSONE (DELTASONE) tablet 20 mg  20 mg Oral Q breakfast Ghimire, Werner Lean, MD         Discharge Medications: Please see discharge summary for a list of discharge medications.  Relevant Imaging Results:  Relevant Lab Results:   Additional Information SSN: [id number] [place], [place]

## 2022-08-07 NOTE — Progress Notes (Signed)
OT Cancellation Note  Patient Details Name: Sonia [name] MRN: [id number] DOB: [birth year]   Cancelled Treatment:    Reason Eval/Treat Not Completed: Patient at procedure or test/ unavailable (Pt meeting with Cardiology team. OT to attempt to see pt later in the day as pt is able to participate and as the schedule allows.)  Grisell Bissette "Ronaldo Miyamoto" M., OTR/L, MA Acute Rehab [phone]   Lendon Colonel 08/07/2022, 2:43 PM

## 2022-08-07 NOTE — Telephone Encounter (Signed)
Patient called stating she had been admitted to the hospital since 08/04/22 and that she would be putting her physical therapy on hold for now. She will be receiving home health therapy when she is discharged from hospital. I advised her that we would also require resume orders from her doctor in order to restart PT. She does not think she will be returning to PT in a while. For further information or details please contact the patient directly. Thanks.

## 2022-08-08 DIAGNOSIS — E039 Hypothyroidism, unspecified: Secondary | ICD-10-CM | POA: Diagnosis not present

## 2022-08-08 DIAGNOSIS — D696 Thrombocytopenia, unspecified: Secondary | ICD-10-CM | POA: Diagnosis not present

## 2022-08-08 DIAGNOSIS — J4540 Moderate persistent asthma, uncomplicated: Secondary | ICD-10-CM | POA: Diagnosis not present

## 2022-08-08 DIAGNOSIS — Z7952 Long term (current) use of systemic steroids: Secondary | ICD-10-CM | POA: Diagnosis not present

## 2022-08-08 DIAGNOSIS — I4719 Other supraventricular tachycardia: Secondary | ICD-10-CM | POA: Diagnosis not present

## 2022-08-08 LAB — COMPREHENSIVE METABOLIC PANEL
ALT: 19 U/L (ref 0–44)
AST: 39 U/L (ref 15–41)
Albumin: 2.2 g/dL — ABNORMAL LOW (ref 3.5–5.0)
Alkaline Phosphatase: 40 U/L (ref 38–126)
Anion gap: 7 (ref 5–15)
BUN: 16 mg/dL (ref 8–23)
CO2: 23 mmol/L (ref 22–32)
Calcium: 7.4 mg/dL — ABNORMAL LOW (ref 8.9–10.3)
Chloride: 107 mmol/L (ref 98–111)
Creatinine, Ser: 0.88 mg/dL (ref 0.44–1.00)
GFR, Estimated: >60 mL/min (ref >60)
Glucose, Bld: 94 mg/dL (ref 70–99)
Potassium: 3.9 mmol/L (ref 3.5–5.1)
Sodium: 137 mmol/L (ref 135–145)
Total Bilirubin: 0.6 mg/dL (ref 0.3–1.2)
Total Protein: 4.5 g/dL — ABNORMAL LOW (ref 6.5–8.1)

## 2022-08-08 LAB — CBC
HCT: 35.6 % — ABNORMAL LOW (ref 36.0–46.0)
Hemoglobin: 12 g/dL (ref 12.0–15.0)
MCH: 29.5 pg (ref 26.0–34.0)
MCHC: 33.7 g/dL (ref 30.0–36.0)
MCV: 87.5 fL (ref 80.0–100.0)
Platelets: 88 10*3/uL — ABNORMAL LOW (ref 150–400)
RBC: 4.07 MIL/uL (ref 3.87–5.11)
RDW: 13.9 % (ref 11.5–15.5)
WBC: 11.2 10*3/uL — ABNORMAL HIGH (ref 4.0–10.5)
nRBC: 0 % (ref 0.0–0.2)

## 2022-08-08 MED ORDER — PREDNISONE 1 MG PO TABS: 2.0000 mg | ORAL_TABLET | Freq: Every day | ORAL | Status: DC

## 2022-08-08 MED ORDER — DRONEDARONE HCL 400 MG PO TABS: 400.0000 mg | ORAL_TABLET | Freq: Two times a day (BID) | ORAL | Status: AC

## 2022-08-08 MED ORDER — PREDNISONE 5 MG PO TABS: 10.0000 mg | ORAL_TABLET | Freq: Every day | ORAL | Status: DC

## 2022-08-08 NOTE — Progress Notes (Signed)
Subjective:  Denies SSCP, palpitations or Dyspnea Getting breathing Rx  Objective:  Vitals:   08/08/22 0817 08/08/22 0818 08/08/22 0819 08/08/22 0844  BP:    127/78  Pulse:    75  Resp:    17  Temp:    97.7 F (36.5 C)  TempSrc:    Oral  SpO2: 96% 96% 96% 97%  Weight:      Height:        Intake/Output from previous day:  Intake/Output Summary (Last 24 hours) at 08/08/2022 0857 Last data filed at 08/07/2022 1400 Gross per 24 hour  Intake 3263.92 ml  Output --  Net 3263.92 ml    Physical Exam: Affect appropriate Healthy:  appears stated age HEENT: normal Neck supple with no adenopathy JVP normal no bruits no thyromegaly Lungs clear with no wheezing and good diaphragmatic motion Heart:  S1/S2 no murmur, no rub, gallop or click PMI normal Abdomen: benighn, BS positve, no tenderness, no AAA no bruit.  No HSM or HJR Distal pulses intact with no bruits No edema Neuro non-focal Skin warm and dry No muscular weakness   Lab Results: Basic Metabolic Panel: Recent Labs    08/06/22 2153 08/07/22 0827 08/08/22 0351  NA 133* 135 137  K 3.8 3.3* 3.9  CL 104 105 107  CO2 20* 22 23  GLUCOSE 147* 80 94  BUN 24* 18 16  CREATININE 1.13* 0.98 0.88  CALCIUM 7.6* 6.9* 7.4*  MG 2.1  --   --    Liver Function Tests: Recent Labs    08/06/22 0332 08/08/22 0351  AST 56* 39  ALT 19 19  ALKPHOS 54 40  BILITOT 0.9 0.6  PROT 5.1* 4.5*  ALBUMIN 2.6* 2.2*   No results for input(s): "LIPASE", "AMYLASE" in the last 72 hours. CBC: Recent Labs    08/06/22 1505 08/07/22 0827 08/08/22 0351  WBC 6.2 8.2 11.2*  NEUTROABS 5.4 3.5  --   HGB 13.8 13.2 12.0  HCT 39.9 38.2 35.6*  MCV 86.4 87.6 87.5  PLT 63* 72* 88*    Anemia Panel: Recent Labs    08/06/22 0332  VITAMINB12 4,202*  FOLATE 24.0    Imaging: ECHOCARDIOGRAM COMPLETE  Result Date: 08/07/2022    ECHOCARDIOGRAM REPORT   Patient Name:   Sonia [name] [facility] Date of Exam: 08/07/2022 Medical Rec #:   [id number]                Height:       64.0 in Accession #:    [id number]               Weight:       143.3 lb Date of Birth:  01/08/1941                BSA:          1.698 m Patient Age:    82 years                 BP:           95/68 mmHg Patient Gender: F                        HR:           80 bpm. Exam Location:  Inpatient Procedure: 2D Echo, Cardiac Doppler and Color Doppler Indications:    Atrial Fibrillation I48.91  History:        Patient has prior history of Echocardiogram  examinations, most                 recent 03/11/2022. CAD and Previous Myocardial Infarction, COPD,                 Signs/Symptoms:Chest Pain and Dyspnea; Risk                 Factors:Dyslipidemia. Breast cancer of R breast.  Sonographer:    Lucendia Herrlich Referring Phys: [id number] Dewayne Shorter M GHIMIRE IMPRESSIONS  1. Left ventricular ejection fraction, by estimation, is 60 to 65%. The left ventricle has normal function. The left ventricle has no regional wall motion abnormalities. Left ventricular diastolic parameters are consistent with Grade I diastolic dysfunction (impaired relaxation).  2. Right ventricular systolic function is normal. The right ventricular size is normal. There is normal pulmonary artery systolic pressure. The estimated right ventricular systolic pressure is 14.3 mmHg.  3. The mitral valve is degenerative. Mild mitral valve regurgitation. No evidence of mitral stenosis. Moderate mitral annular calcification.  4. The aortic valve is tricuspid. Aortic valve regurgitation is not visualized. No aortic stenosis is present.  5. The inferior vena cava is normal in size with greater than 50% respiratory variability, suggesting right atrial pressure of 3 mmHg. Comparison(s): No significant change from prior study. FINDINGS  Left Ventricle: Left ventricular ejection fraction, by estimation, is 60 to 65%. The left ventricle has normal function. The left ventricle has no regional wall motion abnormalities. The left ventricular  internal cavity size was normal in size. There is  no left ventricular hypertrophy. Left ventricular diastolic parameters are consistent with Grade I diastolic dysfunction (impaired relaxation). Right Ventricle: The right ventricular size is normal. No increase in right ventricular wall thickness. Right ventricular systolic function is normal. There is normal pulmonary artery systolic pressure. The tricuspid regurgitant velocity is 1.68 m/s, and  with an assumed right atrial pressure of 3 mmHg, the estimated right ventricular systolic pressure is 14.3 mmHg. Left Atrium: Left atrial size was normal in size. Right Atrium: Right atrial size was normal in size. Pericardium: There is no evidence of pericardial effusion. Mitral Valve: The mitral valve is degenerative in appearance. There is moderate calcification of the anterior and posterior mitral valve leaflet(s). Moderate mitral annular calcification. Mild mitral valve regurgitation. No evidence of mitral valve stenosis. MV peak gradient, 4.8 mmHg. The mean mitral valve gradient is 2.0 mmHg. Tricuspid Valve: The tricuspid valve is grossly normal. Tricuspid valve regurgitation is trivial. No evidence of tricuspid stenosis. Aortic Valve: The aortic valve is tricuspid. Aortic valve regurgitation is not visualized. No aortic stenosis is present. Aortic valve peak gradient measures 3.9 mmHg. Pulmonic Valve: The pulmonic valve was grossly normal. Pulmonic valve regurgitation is not visualized. No evidence of pulmonic stenosis. Aorta: The aortic root and ascending aorta are structurally normal, with no evidence of dilitation. Venous: The inferior vena cava is normal in size with greater than 50% respiratory variability, suggesting right atrial pressure of 3 mmHg. IAS/Shunts: The atrial septum is grossly normal.  LEFT VENTRICLE PLAX 2D LVIDd:         4.00 cm   Diastology LVIDs:         3.00 cm   LV e' medial:    5.91 cm/s LV PW:         0.90 cm   LV E/e' medial:  14.9 LV IVS:         0.80 cm   LV e' lateral:   9.58 cm/s LVOT diam:  1.90 cm   LV E/e' lateral: 9.2 LV SV:         41 LV SV Index:   24 LVOT Area:     2.84 cm  RIGHT VENTRICLE             IVC RV S prime:     18.20 cm/s  IVC diam: 1.20 cm TAPSE (M-mode): 2.4 cm LEFT ATRIUM             Index        RIGHT ATRIUM           Index LA diam:        3.20 cm 1.88 cm/m   RA Area:     11.00 cm LA Vol (A2C):   37.8 ml 22.26 ml/m  RA Volume:   23.80 ml  14.02 ml/m LA Vol (A4C):   44.4 ml 26.15 ml/m LA Biplane Vol: 42.2 ml 24.85 ml/m  AORTIC VALVE AV Area (Vmax): 2.21 cm AV Vmax:        98.90 cm/s AV Peak Grad:   3.9 mmHg LVOT Vmax:      77.10 cm/s LVOT Vmean:     50.400 cm/s LVOT VTI:       0.143 m  AORTA Ao Root diam: 3.10 cm Ao Asc diam:  2.80 cm MITRAL VALVE                TRICUSPID VALVE MV Area (PHT): 3.91 cm     TR Peak grad:   11.3 mmHg MV Area VTI:   1.42 cm     TR Vmax:        168.00 cm/s MV Peak grad:  4.8 mmHg MV Mean grad:  2.0 mmHg     SHUNTS MV Vmax:       1.09 m/s     Systemic VTI:  0.14 m MV Vmean:      61.0 cm/s    Systemic Diam: 1.90 cm MV Decel Time: 194 msec MV E velocity: 88.30 cm/s MV A velocity: 108.00 cm/s MV E/A ratio:  0.82 Lennie Odor MD Electronically signed by Lennie Odor MD Signature Date/Time: 08/07/2022/5:22:59 PM    Final     Cardiac Studies:  ECG: SR PAC atrial tach normal QT   Telemetry:  NSR  Echo: EF normal no significant valve dx  Medications:    arformoterol  15 mcg Nebulization BID   budesonide (PULMICORT) nebulizer solution  0.25 mg Nebulization BID   dronedarone  400 mg Oral BID WC   feeding supplement  1 Container Oral TID BM   ipratropium-albuterol  3 mL Nebulization BID   levothyroxine  125 mcg Oral Q0600   multivitamin with minerals  1 tablet Oral Daily   predniSONE  20 mg Oral Q breakfast      sodium chloride 50 mL/hr at 08/07/22 1400    Assessment/Plan:  Atrial tachycardia: improved on Multaq no need for anticoagulation consider outpatient monitor after 3  weeks of Rx BP:  improved cortisol and thyroid ok prednisone with breakfast   Atrial arrhythmias likely from asthma/lung dx  Will arrange outpatient f/u with Dr Melina Modena 08/08/2022, 8:57 AM

## 2022-08-08 NOTE — Progress Notes (Signed)
Rounding Note    Patient Name: Sonia [name] Date of Encounter: 08/08/2022  [facility] Cardiologist: Verne Carrow, MD   Subjective   No chest pain no SOB  Inpatient Medications    Scheduled Meds:  arformoterol  15 mcg Nebulization BID   budesonide (PULMICORT) nebulizer solution  0.25 mg Nebulization BID   dronedarone  400 mg Oral BID WC   feeding supplement  1 Container Oral TID BM   ipratropium-albuterol  3 mL Nebulization BID   levothyroxine  125 mcg Oral Q0600   multivitamin with minerals  1 tablet Oral Daily   predniSONE  20 mg Oral Q breakfast   Continuous Infusions:  sodium chloride 50 mL/hr at 08/07/22 1400   PRN Meds: albuterol, albuterol, famotidine   Vital Signs    Vitals:   08/08/22 0400 08/08/22 0817 08/08/22 0818 08/08/22 0819  BP: 132/69     Pulse: 69     Resp: 20     Temp: 98.6 F (37 C)     TempSrc:      SpO2: 95% 96% 96% 96%  Weight:      Height:        Intake/Output Summary (Last 24 hours) at 08/08/2022 0825 Last data filed at 08/07/2022 1400 Gross per 24 hour  Intake 3263.92 ml  Output --  Net 3263.92 ml      08/04/2022   11:25 PM 05/20/2022    9:53 PM 05/01/2022    3:59 PM  Last 3 Weights  Weight (lbs) 143 lb 4.8 oz 149 lb 149 lb  Weight (kg) 65 kg 67.586 kg 67.586 kg      Telemetry    SR - Personally Reviewed  ECG    No new - Personally Reviewed  Physical Exam   GEN: No acute distress.   Neck: No JVD Cardiac: RRR, no murmurs, rubs, or gallops.  Respiratory: Clear to auscultation bilaterally. GI: Soft, nontender, non-distended  MS: No edema; No deformity. Neuro:  Nonfocal  Psych: Normal affect   Labs    High Sensitivity Troponin:   Recent Labs  Lab 08/04/22 1711  TROPONINIHS 28*     Chemistry Recent Labs  Lab 08/04/22 1711 08/04/22 1724 08/06/22 0332 08/06/22 2153 08/07/22 0827 08/08/22 0351  NA 126*   < > 135 133* 135 137  K 3.6   < > 3.5 3.8 3.3* 3.9  CL 94*   < > 107  104 105 107  CO2 19*   < > 21* 20* 22 23  GLUCOSE 92   < > 105* 147* 80 94  BUN 24*   < > 19 24* 18 16  CREATININE 1.28*   < > 0.86 1.13* 0.98 0.88  CALCIUM 7.9*   < > 7.8* 7.6* 6.9* 7.4*  MG 2.1  --   --  2.1  --   --   PROT 5.6*  --  5.1*  --   --  4.5*  ALBUMIN 2.9*  --  2.6*  --   --  2.2*  AST 70*  --  56*  --   --  39  ALT 22  --  19  --   --  19  ALKPHOS 48  --  54  --   --  40  BILITOT 0.8  --  0.9  --   --  0.6  GFRNONAA 42*   < > >60 48* 57* >60  ANIONGAP 13   < > 7 9 8 7    < > =  values in this interval not displayed.    Lipids No results for input(s): "CHOL", "TRIG", "HDL", "LABVLDL", "LDLCALC", "CHOLHDL" in the last 168 hours.  Hematology Recent Labs  Lab 08/06/22 1505 08/07/22 0827 08/08/22 0351  WBC 6.2 8.2 11.2*  RBC 4.62 4.36 4.07  HGB 13.8 13.2 12.0  HCT 39.9 38.2 35.6*  MCV 86.4 87.6 87.5  MCH 29.9 30.3 29.5  MCHC 34.6 34.6 33.7  RDW 13.3 13.6 13.9  PLT 63* 72* 88*   Thyroid  Recent Labs  Lab 08/04/22 1711 08/07/22 0827  TSH 5.405*  --   FREET4 0.85 1.22*    BNP Recent Labs  Lab 08/04/22 1711  BNP 16.6    DDimer No results for input(s): "DDIMER" in the last 168 hours.   Radiology    ECHOCARDIOGRAM COMPLETE  Result Date: 08/07/2022    ECHOCARDIOGRAM REPORT   Patient Name:   TYNAE DREESSEN Date of Exam: 08/07/2022 Medical Rec #:  [id number]                Height:       64.0 in Accession #:    [id number]               Weight:       143.3 lb Date of Birth:  05-14-1940                BSA:          1.698 m Patient Age:    82 years                 BP:           95/68 mmHg Patient Gender: F                        HR:           80 bpm. Exam Location:  Inpatient Procedure: 2D Echo, Cardiac Doppler and Color Doppler Indications:    Atrial Fibrillation I48.91  History:        Patient has prior history of Echocardiogram examinations, most                 recent 03/11/2022. CAD and Previous Myocardial Infarction, COPD,                  Signs/Symptoms:Chest Pain and Dyspnea; Risk                 Factors:Dyslipidemia. Breast cancer of R breast.  Sonographer:    Lucendia Herrlich Referring Phys: [id number] Dewayne Shorter M GHIMIRE IMPRESSIONS  1. Left ventricular ejection fraction, by estimation, is 60 to 65%. The left ventricle has normal function. The left ventricle has no regional wall motion abnormalities. Left ventricular diastolic parameters are consistent with Grade I diastolic dysfunction (impaired relaxation).  2. Right ventricular systolic function is normal. The right ventricular size is normal. There is normal pulmonary artery systolic pressure. The estimated right ventricular systolic pressure is 14.3 mmHg.  3. The mitral valve is degenerative. Mild mitral valve regurgitation. No evidence of mitral stenosis. Moderate mitral annular calcification.  4. The aortic valve is tricuspid. Aortic valve regurgitation is not visualized. No aortic stenosis is present.  5. The inferior vena cava is normal in size with greater than 50% respiratory variability, suggesting right atrial pressure of 3 mmHg. Comparison(s): No significant change from prior study. FINDINGS  Left Ventricle: Left ventricular ejection fraction, by estimation, is 60 to 65%. The left ventricle has  normal function. The left ventricle has no regional wall motion abnormalities. The left ventricular internal cavity size was normal in size. There is  no left ventricular hypertrophy. Left ventricular diastolic parameters are consistent with Grade I diastolic dysfunction (impaired relaxation). Right Ventricle: The right ventricular size is normal. No increase in right ventricular wall thickness. Right ventricular systolic function is normal. There is normal pulmonary artery systolic pressure. The tricuspid regurgitant velocity is 1.68 m/s, and  with an assumed right atrial pressure of 3 mmHg, the estimated right ventricular systolic pressure is 14.3 mmHg. Left Atrium: Left atrial size was normal in  size. Right Atrium: Right atrial size was normal in size. Pericardium: There is no evidence of pericardial effusion. Mitral Valve: The mitral valve is degenerative in appearance. There is moderate calcification of the anterior and posterior mitral valve leaflet(s). Moderate mitral annular calcification. Mild mitral valve regurgitation. No evidence of mitral valve stenosis. MV peak gradient, 4.8 mmHg. The mean mitral valve gradient is 2.0 mmHg. Tricuspid Valve: The tricuspid valve is grossly normal. Tricuspid valve regurgitation is trivial. No evidence of tricuspid stenosis. Aortic Valve: The aortic valve is tricuspid. Aortic valve regurgitation is not visualized. No aortic stenosis is present. Aortic valve peak gradient measures 3.9 mmHg. Pulmonic Valve: The pulmonic valve was grossly normal. Pulmonic valve regurgitation is not visualized. No evidence of pulmonic stenosis. Aorta: The aortic root and ascending aorta are structurally normal, with no evidence of dilitation. Venous: The inferior vena cava is normal in size with greater than 50% respiratory variability, suggesting right atrial pressure of 3 mmHg. IAS/Shunts: The atrial septum is grossly normal.  LEFT VENTRICLE PLAX 2D LVIDd:         4.00 cm   Diastology LVIDs:         3.00 cm   LV e' medial:    5.91 cm/s LV PW:         0.90 cm   LV E/e' medial:  14.9 LV IVS:        0.80 cm   LV e' lateral:   9.58 cm/s LVOT diam:     1.90 cm   LV E/e' lateral: 9.2 LV SV:         41 LV SV Index:   24 LVOT Area:     2.84 cm  RIGHT VENTRICLE             IVC RV S prime:     18.20 cm/s  IVC diam: 1.20 cm TAPSE (M-mode): 2.4 cm LEFT ATRIUM             Index        RIGHT ATRIUM           Index LA diam:        3.20 cm 1.88 cm/m   RA Area:     11.00 cm LA Vol (A2C):   37.8 ml 22.26 ml/m  RA Volume:   23.80 ml  14.02 ml/m LA Vol (A4C):   44.4 ml 26.15 ml/m LA Biplane Vol: 42.2 ml 24.85 ml/m  AORTIC VALVE AV Area (Vmax): 2.21 cm AV Vmax:        98.90 cm/s AV Peak Grad:   3.9  mmHg LVOT Vmax:      77.10 cm/s LVOT Vmean:     50.400 cm/s LVOT VTI:       0.143 m  AORTA Ao Root diam: 3.10 cm Ao Asc diam:  2.80 cm MITRAL VALVE  TRICUSPID VALVE MV Area (PHT): 3.91 cm     TR Peak grad:   11.3 mmHg MV Area VTI:   1.42 cm     TR Vmax:        168.00 cm/s MV Peak grad:  4.8 mmHg MV Mean grad:  2.0 mmHg     SHUNTS MV Vmax:       1.09 m/s     Systemic VTI:  0.14 m MV Vmean:      61.0 cm/s    Systemic Diam: 1.90 cm MV Decel Time: 194 msec MV E velocity: 88.30 cm/s MV A velocity: 108.00 cm/s MV E/A ratio:  0.82 Lennie Odor MD Electronically signed by Lennie Odor MD Signature Date/Time: 08/07/2022/5:22:59 PM    Final     Cardiac Studies   Echo 08/07/22  IMPRESSIONS     1. Left ventricular ejection fraction, by estimation, is 60 to 65%. The  left ventricle has normal function. The left ventricle has no regional  wall motion abnormalities. Left ventricular diastolic parameters are  consistent with Grade I diastolic  dysfunction (impaired relaxation).   2. Right ventricular systolic function is normal. The right ventricular  size is normal. There is normal pulmonary artery systolic pressure. The  estimated right ventricular systolic pressure is 14.3 mmHg.   3. The mitral valve is degenerative. Mild mitral valve regurgitation. No  evidence of mitral stenosis. Moderate mitral annular calcification.   4. The aortic valve is tricuspid. Aortic valve regurgitation is not  visualized. No aortic stenosis is present.   5. The inferior vena cava is normal in size with greater than 50%  respiratory variability, suggesting right atrial pressure of 3 mmHg.   Comparison(s): No significant change from prior study.   FINDINGS   Left Ventricle: Left ventricular ejection fraction, by estimation, is 60  to 65%. The left ventricle has normal function. The left ventricle has no  regional wall motion abnormalities. The left ventricular internal cavity  size was normal in size. There  is   no left ventricular hypertrophy. Left ventricular diastolic parameters  are consistent with Grade I diastolic dysfunction (impaired relaxation).   Right Ventricle: The right ventricular size is normal. No increase in  right ventricular wall thickness. Right ventricular systolic function is  normal. There is normal pulmonary artery systolic pressure. The tricuspid  regurgitant velocity is 1.68 m/s, and   with an assumed right atrial pressure of 3 mmHg, the estimated right  ventricular systolic pressure is 14.3 mmHg.   Left Atrium: Left atrial size was normal in size.   Right Atrium: Right atrial size was normal in size.   Pericardium: There is no evidence of pericardial effusion.   Mitral Valve: The mitral valve is degenerative in appearance. There is  moderate calcification of the anterior and posterior mitral valve  leaflet(s). Moderate mitral annular calcification. Mild mitral valve  regurgitation. No evidence of mitral valve  stenosis. MV peak gradient, 4.8 mmHg. The mean mitral valve gradient is  2.0 mmHg.   Tricuspid Valve: The tricuspid valve is grossly normal. Tricuspid valve  regurgitation is trivial. No evidence of tricuspid stenosis.   Aortic Valve: The aortic valve is tricuspid. Aortic valve regurgitation is  not visualized. No aortic stenosis is present. Aortic valve peak gradient  measures 3.9 mmHg.   Pulmonic Valve: The pulmonic valve was grossly normal. Pulmonic valve  regurgitation is not visualized. No evidence of pulmonic stenosis.   Aorta: The aortic root and ascending aorta are structurally normal, with  no evidence of dilitation.   Venous: The inferior vena cava is normal in size with greater than 50%  respiratory variability, suggesting right atrial pressure of 3 mmHg.   IAS/Shunts: The atrial septum is grossly normal.   Echocardiogram 03/11/2022   1. Left ventricular ejection fraction, by estimation, is >75%. The left  ventricle has  hyperdynamic function. The left ventricle has no regional  wall motion abnormalities. Left ventricular diastolic parameters are  consistent with Grade I diastolic  dysfunction (impaired relaxation).   2. Right ventricular systolic function is normal. The right ventricular  size is normal. Tricuspid regurgitation signal is inadequate for assessing  PA pressure.   3. The mitral valve is degenerative. No evidence of mitral valve  regurgitation. Mild mitral stenosis. The mean mitral valve gradient is 4.5  mmHg.   4. The aortic valve is calcified. Aortic valve regurgitation is not  visualized. Aortic valve sclerosis/calcification is present, without any  evidence of aortic stenosis.   5. The inferior vena cava is normal in size with greater than 50%  respiratory variability, suggesting right atrial pressure of 3 mmHg.    Left heart catheterization 04/14/2019 Ost LM to LM lesion is 25% stenosed. Prox RCA to Mid RCA lesion is 10% stenosed. The left ventricular systolic function is normal. LV end diastolic pressure is normal. The left ventricular ejection fraction is greater than 65% by visual estimate. There is no mitral valve regurgitation. Prox LAD lesion is 50% stenosed.   1. Patent stent proximal to mid RCA with minimal restenosis 2. Moderate mid LAD stenosis, unchanged from last cath. This does not appear to be flow limiting.  3. Mild luminal irregularities in the moderate caliber, non-dominant Circumflex artery.  4. Normal LV systolic function   Recommendations: Continue medical management of CAD      Patient Profile     82 y.o. female  hx of CAD status post DES RCA 2018, COPD, GERD, hypothyroidism, breast cancer, mild mitral stenosis admitted 08/04/22 asthma/COPD exacerbation, metabolic encephalopathy, chronic leukopenia/thrombocytopenia and recent gastroenteritis and new a fib with RVR.Marland Kitchen  Assessment & Plan    Atrial fib RVR/Multifocal atrial tachycardia --defer anticoagulation  in setting of thrombocytopenia --multaq added --BP lower end so not dilt of BB, allergic to iodine with anaphylaxis contraindicating amiodarone,  hx CAD with stents so flecainide contraindicated, discussed with EP Dr. Elberta Fortis and Luevenia Maxin added.  --free T4 1.22  (was 0.85 on admit with TSH of 5.405) --cortisol 9.3 drawn at 1640 yesterday --echo stable with EF 60-65% with G1DD  CAD with hx stent to RCA in 2018 -- no angina last cath 2021 with stable stent to RCA and non obstructive disease of LAD --2023 with RSV and COPD exacerbation with hs trop to 1063, declined cath.  --continue statin   HLD  --in Feb 2024 was on crestor and zetia lipids ordered but not done last LDL 02/2022 at 137 --will recheck, she does not wish to take medication Per IM  Bronchial asthma exacerbation Recent gastroenteritis Hyponatremia Leukopenia/thrombocytopenia. Hypothyroidism     For questions or updates, please contact [facility] Please consult [email or web address] for contact info under        Signed, Nada Boozer, NP  08/08/2022, 8:25 AM

## 2022-08-08 NOTE — Progress Notes (Signed)
Occupational Therapy Treatment Patient Details Name: Sonia [name] MRN: [id number] DOB: 1940-06-08 Today's Date: 08/08/2022   History of present illness Pt is an 82 year old woman admitted on 5/25 with weakness x 1 week, poor appetite and a fall from the edge of her bed. + bronchial asthma exacerbation, hyponatremia, dehydration, leukopenia, thrombocytopenia. PMH: asthma and COPD--steroid dependent, hypothyroidism, HLD, CAD, fibromyalgia, breast cancer.   OT comments  Pt sitting on EOB upon OT arrival. Pt agreeable to participation in skilled OT session. OT educated pt in energy conservation techniques at beginning of session with pt verbalizing and demonstrating understanding through teach back. However, pt demonstrated poor carryover of training at end of session and will benefit from reinforcement of education. OT also educated pt in techniques for increased safety and independence with ADLs and functional transfers/mobility. Pt currently demonstrates ability to perform UB ADLs Independent to Mod I in sitting, LB ADLs with Min guard assist in sit/stand and cues for safety, and functional transfers/mobility with a RW (2 wheel) with Min guard assist and cues for hand placement/technique and safety. Pt is making progress toward OT goals. Pt will benefit from continued acute skilled OT services. Discharge plan remains appropriate.    Recommendations for follow up therapy are one component of a multi-disciplinary discharge planning process, led by the attending physician.  Recommendations may be updated based on patient status, additional functional criteria and insurance authorization.    Assistance Recommended at Discharge Frequent or constant Supervision/Assistance  Patient can return home with the following  A little help with walking and/or transfers;A little help with bathing/dressing/bathroom;Assistance with cooking/housework;Assist for transportation;Help with stairs or ramp for  entrance;Direct supervision/assist for medications management   Equipment Recommendations  Other (comment) (RW)    Recommendations for Other Services      Precautions / Restrictions Precautions Precautions: Fall Restrictions Weight Bearing Restrictions: No       Mobility Bed Mobility Overal bed mobility: Needs Assistance             General bed mobility comments: Pt sitting on EOB upon OT arrival.    Transfers Overall transfer level: Needs assistance Equipment used: 1 person hand held assist Transfers: Sit to/from Stand, Bed to chair/wheelchair/BSC Sit to Stand: Min guard     Step pivot transfers: Min guard           Balance Overall balance assessment: Needs assistance Sitting-balance support: No upper extremity supported, Feet supported Sitting balance-Leahy Scale: Good       Standing balance-Leahy Scale: Poor Standing balance comment: UE support needed for balance                           ADL either performed or assessed with clinical judgement   ADL Overall ADL's : Needs assistance/impaired Eating/Feeding: Independent;Sitting   Grooming: Min guard;Standing   Upper Body Bathing: Sitting;Supervision/ safety   Lower Body Bathing: Min guard;Sit to/from stand;Cueing for safety   Upper Body Dressing : Modified independent;Sitting   Lower Body Dressing: Min guard;Sit to/from stand;Cueing for safety   Toilet Transfer: Min guard;Ambulation;BSC/3in1;Cueing for safety   Toileting- Clothing Manipulation and Hygiene: Min guard;Sit to/from stand       Functional mobility during ADLs: Min guard;Rolling walker (2 wheels);Cueing for safety      Extremity/Trunk Assessment Upper Extremity Assessment Upper Extremity Assessment: Generalized weakness   Lower Extremity Assessment Lower Extremity Assessment: Defer to PT evaluation        Vision  Perception     Praxis      Cognition Arousal/Alertness: Awake/alert Behavior During  Therapy: WFL for tasks assessed/performed, Anxious Overall Cognitive Status: Impaired/Different from baseline Area of Impairment: Memory, Safety/judgement                     Memory: Decreased short-term memory   Safety/Judgement: Decreased awareness of deficits, Decreased awareness of safety     General Comments: Pt educated in energy conservation techniques at the beginning of this session with pt verbalizing and demonstrating understanding through teach back but unable to Independently state any energy conservation techniques at end of session.        Exercises      Shoulder Instructions       General Comments      Pertinent Vitals/ Pain       Pain Assessment Pain Assessment: No/denies pain  Home Living                                          Prior Functioning/Environment              Frequency  Min 2X/week        Progress Toward Goals  OT Goals(current goals can now be found in the care plan section)  Progress towards OT goals: Progressing toward goals     Plan Discharge plan remains appropriate    Co-evaluation                 AM-PAC OT "6 Clicks" Daily Activity     Outcome Measure   Help from another person eating meals?: None Help from another person taking care of personal grooming?: None (in sitting) Help from another person toileting, which includes using toliet, bedpan, or urinal?: A Little Help from another person bathing (including washing, rinsing, drying)?: A Little Help from another person to put on and taking off regular upper body clothing?: None Help from another person to put on and taking off regular lower body clothing?: A Little 6 Click Score: 21    End of Session    OT Visit Diagnosis: Unsteadiness on feet (R26.81);Other abnormalities of gait and mobility (R26.89);Muscle weakness (generalized) (M62.81);Other (comment);History of falling (Z91.81) (decreased activity tolerance)   Activity  Tolerance Patient tolerated treatment well   Patient Left in chair;with call bell/phone within reach;with chair alarm set   Nurse Communication Mobility status        Time: 1440-1506 OT Time Calculation (min): 26 min  Charges: OT General Charges $OT Visit: 1 Visit OT Treatments $Self Care/Home Management : 23-37 mins  Guerline Happ "Orson Eva., OTR/L, MA Acute Rehab [phone]   Lendon Colonel 08/08/2022, 3:27 PM

## 2022-08-08 NOTE — TOC Progression Note (Addendum)
Transition of Care [facility]) - Progression Note    Patient Details  Name: Sonia [name] MRN: [id number] Date of Birth: 04/10/1940  Transition [facility] CM/SW Contact  Mearl Latin, LCSW Phone Number: 08/08/2022, 11:52 AM  Clinical Narrative:    9:15am-CSW met with patient and provided SNF bed offers. CSW assisted patient in calling her daughter in law who requested [facility] or 2-[facility]. Patient stated she would like [facility] as the second option of possible. CSW awaiting responses from the facilities on bed availability.   11:30pm-CSW received notice from [facility] that [facility] do not have beds available. [facility] stated they should have something today and will let CSW know. [facility] only has one bed available and [facility] has a semi private.   1pm-CSW met with patient and made her aware of updates. She is in agreement with [facility] and CSW assisted her to call Victorino Dike [phone]) to make her aware. Patient is requesting [facility] for transport.    Expected Discharge Plan: Skilled Nursing Facility Barriers to Discharge: Barriers Resolved  Expected Discharge Plan and Services In-house Referral: Clinical Social Work   Post Acute Care Choice: Skilled Nursing Facility Living arrangements for the past 2 months: Single Family Home Expected Discharge Date: 08/08/22                                     Social Determinants of Health (SDOH) Interventions SDOH Screenings   Food Insecurity: No Food Insecurity (08/04/2022)  Housing: Low Risk  (08/04/2022)  Transportation Needs: No Transportation Needs (08/04/2022)  Utilities: Not At Risk (08/04/2022)  Tobacco Use: Low Risk  (08/04/2022)    Readmission Risk Interventions     No data to display

## 2022-08-08 NOTE — Progress Notes (Signed)
Physical Therapy Treatment Patient Details Name: Sonia [name] MRN: [id number] DOB: 1940/07/22 Today's Date: 08/08/2022   History of Present Illness Pt is an 82 year old woman admitted on 5/25 with weakness x 1 week, poor appetite and a fall from the edge of her bed. + bronchial asthma exacerbation, hyponatremia, dehydration, leukopenia, thrombocytopenia. PMH: asthma and COPD--steroid dependent, hypothyroidism, HLD, CAD, fibromyalgia, breast cancer.    PT Comments    Pt greeted up in chair on arrival and agreeable to session with continued progress towards acute goals. Pt requiring grossly min guard for transfers and gait with RW for support with improved posture and gait speed this session with pt endorsing feeling less weak than previous session. Pt without instance hallway veering this session. Pt able to complete standing LE exercise with cues for technique. Pt was educated on continued walker use to maximize functional independence, safety, and decrease risk for falls. Current plan remains appropriate to address deficits and maximize functional independence and decrease caregiver burden. Pt continues to benefit from skilled PT services to progress toward functional mobility goals.    Recommendations for follow up therapy are one component of a multi-disciplinary discharge planning process, led by the attending physician.  Recommendations may be updated based on patient status, additional functional criteria and insurance authorization.  Follow Up Recommendations  Can patient physically be transported by private vehicle: Yes    Assistance Recommended at Discharge Intermittent Supervision/Assistance  Patient can return home with the following A little help with walking and/or transfers;Assistance with cooking/housework;Assist for transportation;Help with stairs or ramp for entrance;A little help with bathing/dressing/bathroom   Equipment Recommendations  None recommended by PT     Recommendations for Other Services       Precautions / Restrictions Precautions Precautions: Fall Restrictions Weight Bearing Restrictions: No     Mobility  Bed Mobility Overal bed mobility: Needs Assistance             General bed mobility comments: pt up in chair on arrival    Transfers Overall transfer level: Needs assistance Equipment used: Rolling walker (2 wheels) Transfers: Sit to/from Stand Sit to Stand: Min guard           General transfer comment: min guard for safety, good hand placement    Ambulation/Gait Ambulation/Gait assistance: Min guard, Min assist Gait Distance (Feet): 220 Feet Assistive device: Rolling walker (2 wheels) Gait Pattern/deviations: Step-through pattern, Decreased stride length, Wide base of support Gait velocity: decr     General Gait Details: slower pace, cues for upright posture and forward gaze   Stairs             Wheelchair Mobility    Modified Rankin (Stroke Patients Only)       Balance Overall balance assessment: Needs assistance   Sitting balance-Leahy Scale: Good       Standing balance-Leahy Scale: Poor Standing balance comment: UE support needed for balance                            Cognition Arousal/Alertness: Awake/alert Behavior During Therapy: WFL for tasks assessed/performed, Anxious Overall Cognitive Status: Within Functional Limits for tasks assessed                                          Exercises Other Exercises Other Exercises: standing marching x10 ea side, standing hip  abduction x10 ea side Other Exercises: serial sit<>stand x10 without UE support    General Comments        Pertinent Vitals/Pain Pain Assessment Pain Assessment: Faces Faces Pain Scale: Hurts a little bit Pain Location: generalized Pain Descriptors / Indicators: Discomfort Pain Intervention(s): Monitored during session, Limited activity within patient's tolerance     Home Living                          Prior Function            PT Goals (current goals can now be found in the care plan section) Acute Rehab PT Goals Patient Stated Goal: to return to independent PT Goal Formulation: With patient Time For Goal Achievement: 08/19/22 Progress towards PT goals: Progressing toward goals    Frequency    Min 3X/week      PT Plan      Co-evaluation              AM-PAC PT "6 Clicks" Mobility   Outcome Measure  Help needed turning from your back to your side while in a flat bed without using bedrails?: A Little Help needed moving from lying on your back to sitting on the side of a flat bed without using bedrails?: A Little Help needed moving to and from a bed to a chair (including a wheelchair)?: A Little Help needed standing up from a chair using your arms (e.g., wheelchair or bedside chair)?: A Little Help needed to walk in hospital room?: A Little Help needed climbing 3-5 steps with a railing? : A Little 6 Click Score: 18    End of Session   Activity Tolerance: Patient tolerated treatment well Patient left: with call bell/phone within reach;in chair Nurse Communication: Mobility status PT Visit Diagnosis: Other abnormalities of gait and mobility (R26.89);Muscle weakness (generalized) (M62.81);Other symptoms and signs involving the nervous system (Z61.096)     Time: 0454-0981 PT Time Calculation (min) (ACUTE ONLY): 26 min  Charges:  $Gait Training: 8-22 mins $Therapeutic Exercise: 8-22 mins                     Kiosha Buchan R. PTA Acute Rehabilitation Services Office: [phone]   Catalina Antigua 08/08/2022, 2:29 PM

## 2022-08-08 NOTE — TOC Transition Note (Signed)
Transition of Care [facility]) - CM/SW Discharge Note   Patient Details  Name: Sonia [name] MRN: [id number] Date of Birth: 08-26-1940  [facility] CM/SW Contact:  Mearl Latin, LCSW Phone Number: 08/08/2022, 3:04 PM   Clinical Narrative:    Patient will DC to: [facility] Anticipated DC date: 08/08/22 Family notified: Daughter in Social worker, Surveyor, minerals by: Sharin Mons   Per MD patient ready for DC to [facility]. RN to call report prior to discharge [phone] room 103). RN, patient, patient's family, and facility notified of DC. Discharge Summary and FL2 sent to facility. DC packet on chart. Ambulance transport requested for patient.   CSW will sign off for now as social work intervention is no longer needed. Please consult [place] again if new needs arise.     Final next level of care: Skilled Nursing Facility Barriers to Discharge: Barriers Resolved   Patient Goals and CMS Choice CMS Medicare.gov Compare Post Acute Care list provided to:: Patient Choice offered to / list presented to : Patient  Discharge Placement     Existing PASRR number confirmed : 08/08/22          Patient chooses bed at: [facility] Patient to be transferred to facility by: [facility] Name of family member notified: Daughter in law Patient and family notified of of transfer: 08/08/22  Discharge Plan and Services Additional resources added to the After Visit Summary for   In-house Referral: Clinical Social Work   Post Acute Care Choice: Skilled Nursing Facility                               Social Determinants of Health (SDOH) Interventions SDOH Screenings   Food Insecurity: No Food Insecurity (08/04/2022)  Housing: Low Risk  (08/04/2022)  Transportation Needs: No Transportation Needs (08/04/2022)  Utilities: Not At Risk (08/04/2022)  Tobacco Use: Low Risk  (08/04/2022)     Readmission Risk Interventions     No data to display

## 2022-08-08 NOTE — Discharge Summary (Addendum)
PATIENT DETAILS Name: Sonia [name] Age: 82 y.o. Sex: female Date of Birth: March 30, 1940 MRN: [id number]. Admitting Physician: Hillary Bow, DO EXB:MWUXLKG, Jasmine December, MD  Admit Date: 08/04/2022 Discharge date: 08/08/2022  Recommendations for Outpatient Follow-up:  Follow up with PCP in 1-2 weeks Please obtain CMP/CBC in one week Ensure follow-up with cardiology and hematology (Dr. Leonides Schanz) Multiple myeloma panel pending-please follow  Admitted From:  Home  Disposition: Skilled nursing facility   Discharge Condition: good  CODE STATUS:   Code Status: Full Code   Diet recommendation:  Diet Order             Diet - low sodium heart healthy           Diet Heart Room service appropriate? Yes; Fluid consistency: Thin  Diet effective now                    Brief Summary: Patient is a 82 y.o.  female with a history of steroid-dependent asthma/COPD, hypothyroidism who presented with weakness.  Per history obtained from family after admission-patient apparently had transient diarrheal illness-subsequently became weak-she was found to have asthma exacerbation along with hyponatremia/leukopenia/thrombocytopenia and subsequently admitted to the hospitalist service.     Significant events: 5/25>> admit to [facility] 5/27>> run of A-fib with RVR   Significant studies: 5/25>> CXR: No PNA.   Significant microbiology data: 5/25>> respiratory virus panel: Negative 5/25>> COVID PCR: Negative   Procedures: None   Consults: Hematology Cardiology  Brief Hospital Course: Acute metabolic encephalopathy Likely secondary to dehydration/hyponatremia in the setting of recent diarrheal illness Mentation has improved-close to baseline, per patient's granddaughter Lauren-patient does have some mild cognitive issues-especially with recall at baseline. Follow-up with PCP/neurology as an outpatient for potential dementia workup    Bronchial asthma with exacerbation Much  better-no further wheezing Continue prednisone-and slowly taper down to her usual home regimen of 2 mg p.o. daily. Continue bronchodilators.   Recent gastroenteritis No further diarrhea/vomiting Seems to have resolved Likely the cause for electrolyte abnormalities and weakness/encephalopathy.   Hyponatremia Likely due to poor oral intake Resolved with IVF/supportive care   Leukopenia/thrombocytopenia Unclear etiology Respiratory virus panel/COVID negative Suspect this could be a sequelae of her recent gastroenteritis-probably due to a viral syndrome. Leukopenia resolved-thrombocytopenia improving-repeat CBC in 1 week Evaluated by hematology-multiple myeloma panel ordered-currently pending-please follow-up.  Atrial tachycardia  Evaluated by cardiology Started on Multaq No need for anticoagulation as this is not A-fib/a flutter Please ensure follow-up with cardiology in the outpatient setting.     Hypothyroidism TSH only minimally elevated Continue current dosing of Synthroid and follow-up with PCP for further adjustment.     HLD Apparently no longer on statin or Zetia.   GERD Pepcid   Debility/deconditioning Due to acute illness PT/OT eval-SNF recommended   BMI: Estimated body mass index is 24.58 kg/m as calculated from the following:   Height as of this encounter: 5' 4.02" (1.626 m).   Weight as of this encounter: 65 kg.    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 Active Problems:   Hyponatremia   Thrombocytopenia (HCC)   Hypothyroidism   Atrial tachycardia   Discharge Instructions:  Activity:  As tolerated with Full fall precautions use walker/cane & assistance as needed   Discharge Instructions     Call MD for:  extreme fatigue   Complete by: As directed    Call MD for:  persistant dizziness or light-headedness   Complete by:  As directed    Diet - low sodium heart healthy   Complete by:  As directed    Discharge instructions   Complete by: As directed    Follow with Primary MD  Mila Palmer, MD in 1-2 weeks  Please get a complete blood count and chemistry panel checked by your Primary MD at your next visit, and again as instructed by your Primary MD.  Get Medicines reviewed and adjusted: Please take all your medications with you for your next visit with your Primary MD  Laboratory/radiological data: Please request your Primary MD to go over all hospital tests and procedure/radiological results at the follow up, please ask your Primary MD to get all Hospital records sent to his/her office.  In some cases, they will be blood work, cultures and biopsy results pending at the time of your discharge. Please request that your primary care M.D. follows up on these results.  Also Note the following: If you experience worsening of your admission symptoms, develop shortness of breath, life threatening emergency, suicidal or homicidal thoughts you must seek medical attention immediately by calling 911 or calling your MD immediately  if symptoms less severe.  You must read complete instructions/literature along with all the possible adverse reactions/side effects for all the Medicines you take and that have been prescribed to you. Take any new Medicines after you have completely understood and accpet all the possible adverse reactions/side effects.   Do not drive when taking Pain medications or sleeping medications (Benzodaizepines)  Do not take more than prescribed Pain, Sleep and Anxiety Medications. It is not advisable to combine anxiety,sleep and pain medications without talking with your primary care practitioner  Special Instructions: If you have smoked or chewed Tobacco  in the last 2 yrs please stop smoking, stop any regular Alcohol  and or any Recreational drug use.  Wear Seat belts while driving.  Please note: You were cared for by a hospitalist during your hospital  stay. Once you are discharged, your primary care physician will handle any further medical issues. Please note that NO REFILLS for any discharge medications will be authorized once you are discharged, as it is imperative that you return to your primary care physician (or establish a relationship with a primary care physician if you do not have one) for your post hospital discharge needs so that they can reassess your need for medications and monitor your lab values.   Increase activity slowly   Complete by: As directed       Allergies as of 08/08/2022       Reactions   Adenocard [adenosine] Anaphylaxis   Contrast Media [iodinated Contrast Media] Anaphylaxis   Influenza Virus Vaccine Anaphylaxis   Macrobid [nitrofurantoin] Itching   Milk (cow) Other (See Comments)   Stomach upset   Breztri Aerosphere [budeson-glycopyrrol-formoterol] Other (See Comments)   Urinary retention.   Codeine Sulfate Nausea And Vomiting   Latex Other (See Comments)   Skin irritation        Medication List     STOP taking these medications    Aspirin Low Dose 81 MG tablet Generic drug: aspirin EC   ezetimibe 10 MG tablet Commonly known as: ZETIA   pantoprazole 40 MG tablet Commonly known as: Protonix       TAKE these medications    albuterol (2.5 MG/3ML) 0.083% nebulizer solution Commonly known as: PROVENTIL Take 3 mLs (2.5 mg total) by nebulization every 6 (six) hours as needed for wheezing or shortness of breath. What  changed: Another medication with the same name was changed. Make sure you understand how and when to take each.   Ventolin HFA 108 (90 Base) MCG/ACT inhaler Generic drug: albuterol Inhale 2 puffs into the lungs every 6 (six) hours as needed for wheezing or shortness of breath. May inhale 2 puffs every 4 hours if needed What changed: when to take this   dronedarone 400 MG tablet Commonly known as: MULTAQ Take 1 tablet (400 mg total) by mouth 2 (two) times daily with a meal.    famotidine 20 MG tablet Commonly known as: PEPCID Take 20 mg by mouth daily as needed for heartburn or indigestion.   levothyroxine 125 MCG tablet Commonly known as: SYNTHROID Take 125 mcg by mouth daily before breakfast.   multivitamin with minerals tablet Take 1 tablet by mouth daily.   nitroGLYCERIN 0.4 MG SL tablet Commonly known as: NITROSTAT Place 0.4 mg under the tongue every 5 (five) minutes x 3 doses as needed for chest pain.   predniSONE 1 MG tablet Commonly known as: DELTASONE Take 2 mg by mouth daily.   rosuvastatin 5 MG tablet Commonly known as: CRESTOR Take 1 tablet (5 mg total) by mouth 3 (three) times a week.   Symbicort 160-4.5 MCG/ACT inhaler Generic drug: budesonide-formoterol Inhale 2 puffs into the lungs in the morning and at bedtime.        Follow-up Information     Mila Palmer, MD. Schedule an appointment as soon as possible for a visit in 1 week(s).   Specialty: Family Medicine Contact information: [address] [place] [address] [phone]         Kathleene Hazel, MD Follow up.   Specialty: Cardiology Why: Office will call with date/time, If you dont hear from them,please give them a call Contact information: [address] [place] [address] [phone]         Jaci Standard, MD. Schedule an appointment as soon as possible for a visit in 2 week(s).   Specialty: Hematology and Oncology Contact information: [address] [place] [address] [phone]                Allergies  Allergen Reactions   Adenocard [Adenosine] Anaphylaxis   Contrast Media [Iodinated Contrast Media] Anaphylaxis   Influenza Virus Vaccine Anaphylaxis   Macrobid [Nitrofurantoin] Itching   Milk (Cow) Other (See Comments)    Stomach upset   Breztri Aerosphere [Budeson-Glycopyrrol-Formoterol] Other (See Comments)    Urinary retention.   Codeine Sulfate Nausea And Vomiting    Latex Other (See Comments)    Skin irritation     Other Procedures/Studies: ECHOCARDIOGRAM COMPLETE  Result Date: 08/07/2022    ECHOCARDIOGRAM REPORT   Patient Name:   Sonia [name] Date of Exam: 08/07/2022 Medical Rec #:  [id number]                Height:       64.0 in Accession #:    [id number]               Weight:       143.3 lb Date of Birth:  [birth year]                BSA:          1.698 m Patient Age:    81 years                 BP:  95/68 mmHg Patient Gender: F                        HR:           80 bpm. Exam Location:  Inpatient Procedure: 2D Echo, Cardiac Doppler and Color Doppler Indications:    Atrial Fibrillation I48.91  History:        Patient has prior history of Echocardiogram examinations, most                 recent 03/11/2022. CAD and Previous Myocardial Infarction, COPD,                 Signs/Symptoms:Chest Pain and Dyspnea; Risk                 Factors:Dyslipidemia. Breast cancer of R breast.  Sonographer:    Lucendia Herrlich Referring Phys: [id number] Dewayne Shorter M Catherina Pates IMPRESSIONS  1. Left ventricular ejection fraction, by estimation, is 60 to 65%. The left ventricle has normal function. The left ventricle has no regional wall motion abnormalities. Left ventricular diastolic parameters are consistent with Grade I diastolic dysfunction (impaired relaxation).  2. Right ventricular systolic function is normal. The right ventricular size is normal. There is normal pulmonary artery systolic pressure. The estimated right ventricular systolic pressure is 14.3 mmHg.  3. The mitral valve is degenerative. Mild mitral valve regurgitation. No evidence of mitral stenosis. Moderate mitral annular calcification.  4. The aortic valve is tricuspid. Aortic valve regurgitation is not visualized. No aortic stenosis is present.  5. The inferior vena cava is normal in size with greater than 50% respiratory variability, suggesting right atrial pressure of 3 mmHg. Comparison(s): No significant  change from prior study. FINDINGS  Left Ventricle: Left ventricular ejection fraction, by estimation, is 60 to 65%. The left ventricle has normal function. The left ventricle has no regional wall motion abnormalities. The left ventricular internal cavity size was normal in size. There is  no left ventricular hypertrophy. Left ventricular diastolic parameters are consistent with Grade I diastolic dysfunction (impaired relaxation). Right Ventricle: The right ventricular size is normal. No increase in right ventricular wall thickness. Right ventricular systolic function is normal. There is normal pulmonary artery systolic pressure. The tricuspid regurgitant velocity is 1.68 m/s, and  with an assumed right atrial pressure of 3 mmHg, the estimated right ventricular systolic pressure is 14.3 mmHg. Left Atrium: Left atrial size was normal in size. Right Atrium: Right atrial size was normal in size. Pericardium: There is no evidence of pericardial effusion. Mitral Valve: The mitral valve is degenerative in appearance. There is moderate calcification of the anterior and posterior mitral valve leaflet(s). Moderate mitral annular calcification. Mild mitral valve regurgitation. No evidence of mitral valve stenosis. MV peak gradient, 4.8 mmHg. The mean mitral valve gradient is 2.0 mmHg. Tricuspid Valve: The tricuspid valve is grossly normal. Tricuspid valve regurgitation is trivial. No evidence of tricuspid stenosis. Aortic Valve: The aortic valve is tricuspid. Aortic valve regurgitation is not visualized. No aortic stenosis is present. Aortic valve peak gradient measures 3.9 mmHg. Pulmonic Valve: The pulmonic valve was grossly normal. Pulmonic valve regurgitation is not visualized. No evidence of pulmonic stenosis. Aorta: The aortic root and ascending aorta are structurally normal, with no evidence of dilitation. Venous: The inferior vena cava is normal in size with greater than 50% respiratory variability, suggesting right  atrial pressure of 3 mmHg. IAS/Shunts: The atrial septum is grossly normal.  LEFT VENTRICLE PLAX 2D LVIDd:  4.00 cm   Diastology LVIDs:         3.00 cm   LV e' medial:    5.91 cm/s LV PW:         0.90 cm   LV E/e' medial:  14.9 LV IVS:        0.80 cm   LV e' lateral:   9.58 cm/s LVOT diam:     1.90 cm   LV E/e' lateral: 9.2 LV SV:         41 LV SV Index:   24 LVOT Area:     2.84 cm  RIGHT VENTRICLE             IVC RV S prime:     18.20 cm/s  IVC diam: 1.20 cm TAPSE (M-mode): 2.4 cm LEFT ATRIUM             Index        RIGHT ATRIUM           Index LA diam:        3.20 cm 1.88 cm/m   RA Area:     11.00 cm LA Vol (A2C):   37.8 ml 22.26 ml/m  RA Volume:   23.80 ml  14.02 ml/m LA Vol (A4C):   44.4 ml 26.15 ml/m LA Biplane Vol: 42.2 ml 24.85 ml/m  AORTIC VALVE AV Area (Vmax): 2.21 cm AV Vmax:        98.90 cm/s AV Peak Grad:   3.9 mmHg LVOT Vmax:      77.10 cm/s LVOT Vmean:     50.400 cm/s LVOT VTI:       0.143 m  AORTA Ao Root diam: 3.10 cm Ao Asc diam:  2.80 cm MITRAL VALVE                TRICUSPID VALVE MV Area (PHT): 3.91 cm     TR Peak grad:   11.3 mmHg MV Area VTI:   1.42 cm     TR Vmax:        168.00 cm/s MV Peak grad:  4.8 mmHg MV Mean grad:  2.0 mmHg     SHUNTS MV Vmax:       1.09 m/s     Systemic VTI:  0.14 m MV Vmean:      61.0 cm/s    Systemic Diam: 1.90 cm MV Decel Time: 194 msec MV E velocity: 88.30 cm/s MV A velocity: 108.00 cm/s MV E/A ratio:  0.82 Lennie Odor MD Electronically signed by Lennie Odor MD Signature Date/Time: 08/07/2022/5:22:59 PM    Final    DG Chest Port 1 View  Result Date: 08/04/2022 CLINICAL DATA:  Cough, shortness of breath, weakness EXAM: PORTABLE CHEST 1 VIEW COMPARISON:  04/08/2022 FINDINGS: Large hiatal hernia. Heart and mediastinal contours within normal limits. Aortic atherosclerosis. No confluent opacities or effusions. No acute bony abnormality. IMPRESSION: No active cardiopulmonary disease. Stable large hiatal hernia. Electronically Signed   By: Charlett Nose M.D.   On: 08/04/2022 17:32     TODAY-DAY OF DISCHARGE:  Subjective:   Sonia [name] today has no headache,no chest abdominal pain,no new weakness tingling or numbness, feels much better wants to go home today.   Objective:   Blood pressure 127/78, pulse 75, temperature 97.7 F (36.5 C), temperature source Oral, resp. rate 17, height 5' 4.02" (1.626 m), weight 65 kg, SpO2 97 %.  Intake/Output Summary (Last 24 hours) at 08/08/2022 1029 Last data filed at 08/08/2022 0900 Gross per 24  hour  Intake 3503.92 ml  Output --  Net 3503.92 ml   Filed Weights   08/04/22 2325  Weight: 65 kg    Exam: Awake Alert, Oriented *3, No new F.N deficits, Normal affect Rote.AT,PERRAL Supple Neck,No JVD, No cervical lymphadenopathy appriciated.  Symmetrical Chest wall movement, Good air movement bilaterally, CTAB RRR,No Gallops,Rubs or new Murmurs, No Parasternal Heave +ve B.Sounds, Abd Soft, Non tender, No organomegaly appriciated, No rebound -guarding or rigidity. No Cyanosis, Clubbing or edema, No new Rash or bruise   PERTINENT RADIOLOGIC STUDIES: ECHOCARDIOGRAM COMPLETE  Result Date: 08/07/2022    ECHOCARDIOGRAM REPORT   Patient Name:   Sonia [name] Date of Exam: 08/07/2022 Medical Rec #:  [id number]                Height:       64.0 in Accession #:    [id number]               Weight:       143.3 lb Date of Birth:  1940-03-20                BSA:          1.698 m Patient Age:    4 years                 BP:           95/68 mmHg Patient Gender: F                        HR:           80 bpm. Exam Location:  Inpatient Procedure: 2D Echo, Cardiac Doppler and Color Doppler Indications:    Atrial Fibrillation I48.91  History:        Patient has prior history of Echocardiogram examinations, most                 recent 03/11/2022. CAD and Previous Myocardial Infarction, COPD,                 Signs/Symptoms:Chest Pain and Dyspnea; Risk                 Factors:Dyslipidemia. Breast cancer of R  breast.  Sonographer:    Lucendia Herrlich Referring Phys: [id number] Dewayne Shorter M Doriann Zuch IMPRESSIONS  1. Left ventricular ejection fraction, by estimation, is 60 to 65%. The left ventricle has normal function. The left ventricle has no regional wall motion abnormalities. Left ventricular diastolic parameters are consistent with Grade I diastolic dysfunction (impaired relaxation).  2. Right ventricular systolic function is normal. The right ventricular size is normal. There is normal pulmonary artery systolic pressure. The estimated right ventricular systolic pressure is 14.3 mmHg.  3. The mitral valve is degenerative. Mild mitral valve regurgitation. No evidence of mitral stenosis. Moderate mitral annular calcification.  4. The aortic valve is tricuspid. Aortic valve regurgitation is not visualized. No aortic stenosis is present.  5. The inferior vena cava is normal in size with greater than 50% respiratory variability, suggesting right atrial pressure of 3 mmHg. Comparison(s): No significant change from prior study. FINDINGS  Left Ventricle: Left ventricular ejection fraction, by estimation, is 60 to 65%. The left ventricle has normal function. The left ventricle has no regional wall motion abnormalities. The left ventricular internal cavity size was normal in size. There is  no left ventricular hypertrophy. Left ventricular diastolic parameters are consistent with Grade I diastolic dysfunction (impaired relaxation). Right Ventricle: The  right ventricular size is normal. No increase in right ventricular wall thickness. Right ventricular systolic function is normal. There is normal pulmonary artery systolic pressure. The tricuspid regurgitant velocity is 1.68 m/s, and  with an assumed right atrial pressure of 3 mmHg, the estimated right ventricular systolic pressure is 14.3 mmHg. Left Atrium: Left atrial size was normal in size. Right Atrium: Right atrial size was normal in size. Pericardium: There is no evidence of  pericardial effusion. Mitral Valve: The mitral valve is degenerative in appearance. There is moderate calcification of the anterior and posterior mitral valve leaflet(s). Moderate mitral annular calcification. Mild mitral valve regurgitation. No evidence of mitral valve stenosis. MV peak gradient, 4.8 mmHg. The mean mitral valve gradient is 2.0 mmHg. Tricuspid Valve: The tricuspid valve is grossly normal. Tricuspid valve regurgitation is trivial. No evidence of tricuspid stenosis. Aortic Valve: The aortic valve is tricuspid. Aortic valve regurgitation is not visualized. No aortic stenosis is present. Aortic valve peak gradient measures 3.9 mmHg. Pulmonic Valve: The pulmonic valve was grossly normal. Pulmonic valve regurgitation is not visualized. No evidence of pulmonic stenosis. Aorta: The aortic root and ascending aorta are structurally normal, with no evidence of dilitation. Venous: The inferior vena cava is normal in size with greater than 50% respiratory variability, suggesting right atrial pressure of 3 mmHg. IAS/Shunts: The atrial septum is grossly normal.  LEFT VENTRICLE PLAX 2D LVIDd:         4.00 cm   Diastology LVIDs:         3.00 cm   LV e' medial:    5.91 cm/s LV PW:         0.90 cm   LV E/e' medial:  14.9 LV IVS:        0.80 cm   LV e' lateral:   9.58 cm/s LVOT diam:     1.90 cm   LV E/e' lateral: 9.2 LV SV:         41 LV SV Index:   24 LVOT Area:     2.84 cm  RIGHT VENTRICLE             IVC RV S prime:     18.20 cm/s  IVC diam: 1.20 cm TAPSE (M-mode): 2.4 cm LEFT ATRIUM             Index        RIGHT ATRIUM           Index LA diam:        3.20 cm 1.88 cm/m   RA Area:     11.00 cm LA Vol (A2C):   37.8 ml 22.26 ml/m  RA Volume:   23.80 ml  14.02 ml/m LA Vol (A4C):   44.4 ml 26.15 ml/m LA Biplane Vol: 42.2 ml 24.85 ml/m  AORTIC VALVE AV Area (Vmax): 2.21 cm AV Vmax:        98.90 cm/s AV Peak Grad:   3.9 mmHg LVOT Vmax:      77.10 cm/s LVOT Vmean:     50.400 cm/s LVOT VTI:       0.143 m  AORTA Ao  Root diam: 3.10 cm Ao Asc diam:  2.80 cm MITRAL VALVE                TRICUSPID VALVE MV Area (PHT): 3.91 cm     TR Peak grad:   11.3 mmHg MV Area VTI:   1.42 cm     TR Vmax:  168.00 cm/s MV Peak grad:  4.8 mmHg MV Mean grad:  2.0 mmHg     SHUNTS MV Vmax:       1.09 m/s     Systemic VTI:  0.14 m MV Vmean:      61.0 cm/s    Systemic Diam: 1.90 cm MV Decel Time: 194 msec MV E velocity: 88.30 cm/s MV A velocity: 108.00 cm/s MV E/A ratio:  0.82 Lennie Odor MD Electronically signed by Lennie Odor MD Signature Date/Time: 08/07/2022/5:22:59 PM    Final      PERTINENT LAB RESULTS: CBC: Recent Labs    08/07/22 0827 08/08/22 0351  WBC 8.2 11.2*  HGB 13.2 12.0  HCT 38.2 35.6*  PLT 72* 88*   CMET CMP     Component Value Date/Time   NA 137 08/08/2022 0351   NA 144 04/10/2019 1044   K 3.9 08/08/2022 0351   CL 107 08/08/2022 0351   CO2 23 08/08/2022 0351   GLUCOSE 94 08/08/2022 0351   BUN 16 08/08/2022 0351   BUN 13 04/10/2019 1044   CREATININE 0.88 08/08/2022 0351   CALCIUM 7.4 (L) 08/08/2022 0351   PROT 4.5 (L) 08/08/2022 0351   PROT 6.2 07/26/2017 1002   ALBUMIN 2.2 (L) 08/08/2022 0351   ALBUMIN 4.1 07/26/2017 1002   AST 39 08/08/2022 0351   ALT 19 08/08/2022 0351   ALKPHOS 40 08/08/2022 0351   BILITOT 0.6 08/08/2022 0351   BILITOT 0.4 07/26/2017 1002   GFRNONAA >60 08/08/2022 0351   GFRAA 57 (L) 04/10/2019 1044    GFR Estimated Creatinine Clearance: 41.8 mL/min (by C-G formula based on SCr of 0.88 mg/dL). No results for input(s): "LIPASE", "AMYLASE" in the last 72 hours. No results for input(s): "CKTOTAL", "CKMB", "CKMBINDEX", "TROPONINI" in the last 72 hours. Invalid input(s): "POCBNP" No results for input(s): "DDIMER" in the last 72 hours. No results for input(s): "HGBA1C" in the last 72 hours. No results for input(s): "CHOL", "HDL", "LDLCALC", "TRIG", "CHOLHDL", "LDLDIRECT" in the last 72 hours. No results for input(s): "TSH", "T4TOTAL", "T3FREE", "THYROIDAB" in the  last 72 hours.  Invalid input(s): "FREET3" Recent Labs    08/06/22 0332  VITAMINB12 4,202*  FOLATE 24.0   Coags: No results for input(s): "INR" in the last 72 hours.  Invalid input(s): "PT" Microbiology: Recent Results (from the past 240 hour(s))  Respiratory (~20 pathogens) panel by PCR     Status: None   Collection Time: 08/04/22  9:54 PM   Specimen: Nasopharyngeal Swab; Respiratory  Result Value Ref Range Status   Adenovirus NOT DETECTED NOT DETECTED Final   Coronavirus 229E NOT DETECTED NOT DETECTED Final    Comment: (NOTE) The Coronavirus on the Respiratory Panel, DOES NOT test for the novel  Coronavirus (2019 nCoV)    Coronavirus HKU1 NOT DETECTED NOT DETECTED Final   Coronavirus NL63 NOT DETECTED NOT DETECTED Final   Coronavirus OC43 NOT DETECTED NOT DETECTED Final   Metapneumovirus NOT DETECTED NOT DETECTED Final   Rhinovirus / Enterovirus NOT DETECTED NOT DETECTED Final   Influenza A NOT DETECTED NOT DETECTED Final   Influenza B NOT DETECTED NOT DETECTED Final   Parainfluenza Virus 1 NOT DETECTED NOT DETECTED Final   Parainfluenza Virus 2 NOT DETECTED NOT DETECTED Final   Parainfluenza Virus 3 NOT DETECTED NOT DETECTED Final   Parainfluenza Virus 4 NOT DETECTED NOT DETECTED Final   Respiratory Syncytial Virus NOT DETECTED NOT DETECTED Final   Bordetella pertussis NOT DETECTED NOT DETECTED Final   Bordetella Parapertussis  NOT DETECTED NOT DETECTED Final   Chlamydophila pneumoniae NOT DETECTED NOT DETECTED Final   Mycoplasma pneumoniae NOT DETECTED NOT DETECTED Final    Comment: Performed at [facility], [address] [place], [place] [address]  SARS Coronavirus 2 by RT PCR (hospital order, performed in [facility] hospital lab) *cepheid single result test* Nasopharyngeal Swab     Status: None   Collection Time: 08/04/22  9:54 PM   Specimen: Nasopharyngeal Swab; Nasal Swab  Result Value Ref Range Status   SARS Coronavirus 2 by RT PCR NEGATIVE NEGATIVE  Final    Comment: Performed at [facility], [address], [place], [place] [address]  MRSA Next Gen by PCR, Nasal     Status: None   Collection Time: 08/04/22  9:54 PM   Specimen: Nasopharyngeal Swab; Nasal Swab  Result Value Ref Range Status   MRSA by PCR Next Gen NOT DETECTED NOT DETECTED Final    Comment: (NOTE) The GeneXpert MRSA Assay (FDA approved for NASAL specimens only), is one component of a comprehensive MRSA colonization surveillance program. It is not intended to diagnose MRSA infection nor to guide or monitor treatment for MRSA infections. Test performance is not FDA approved in patients less than 55 years old. Performed at [facility], [address], [place], [place] [address]     FURTHER DISCHARGE INSTRUCTIONS:  Get Medicines reviewed and adjusted: Please take all your medications with you for your next visit with your Primary MD  Laboratory/radiological data: Please request your Primary MD to go over all hospital tests and procedure/radiological results at the follow up, please ask your Primary MD to get all Hospital records sent to his/her office.  In some cases, they will be blood work, cultures and biopsy results pending at the time of your discharge. Please request that your primary care M.D. goes through all the records of your hospital data and follows up on these results.  Also Note the following: If you experience worsening of your admission symptoms, develop shortness of breath, life threatening emergency, suicidal or homicidal thoughts you must seek medical attention immediately by calling 911 or calling your MD immediately  if symptoms less severe.  You must read complete instructions/literature along with all the possible adverse reactions/side effects for all the Medicines you take and that have been prescribed to you. Take any new Medicines after you have completely understood and accpet all the possible adverse reactions/side effects.    Do not drive when taking Pain medications or sleeping medications (Benzodaizepines)  Do not take more than prescribed Pain, Sleep and Anxiety Medications. It is not advisable to combine anxiety,sleep and pain medications without talking with your primary care practitioner  Special Instructions: If you have smoked or chewed Tobacco  in the last 2 yrs please stop smoking, stop any regular Alcohol  and or any Recreational drug use.  Wear Seat belts while driving.  Please note: You were cared for by a hospitalist during your hospital stay. Once you are discharged, your primary care physician will handle any further medical issues. Please note that NO REFILLS for any discharge medications will be authorized once you are discharged, as it is imperative that you return to your primary care physician (or establish a relationship with a primary care physician if you do not have one) for your post hospital discharge needs so that they can reassess your need for medications and monitor your lab values.  Total Time spent coordinating discharge including  counseling, education and face to face time equals greater than 30 minutes.  SignedJeoffrey Massed 08/08/2022 10:29 AM

## 2022-08-09 NOTE — Care Management Important Message (Signed)
Important Message  Patient Details  Name: Emmalie Trepanier MRN: [id number] Date of Birth: [birth year]   Medicare Important Message Given:  Yes  Patient left prior to IM delivery will send a copy to the patients home address.    Manus Weedman 08/09/2022, 12:01 PM

## 2022-08-10 ENCOUNTER — Encounter (HOSPITAL_BASED_OUTPATIENT_CLINIC_OR_DEPARTMENT_OTHER): Payer: Medicare Other | Admitting: Physical Therapy

## 2022-08-14 ENCOUNTER — Encounter (HOSPITAL_BASED_OUTPATIENT_CLINIC_OR_DEPARTMENT_OTHER): Payer: Medicare Other

## 2022-08-15 LAB — MULTIPLE MYELOMA PANEL, SERUM
Albumin SerPl Elph-Mcnc: 2.6 g/dL — ABNORMAL LOW (ref 2.9–4.4)
Albumin/Glob SerPl: 1.2 (ref 0.7–1.7)
Alpha 1: 0.2 g/dL (ref 0.0–0.4)
Alpha2 Glob SerPl Elph-Mcnc: 0.7 g/dL (ref 0.4–1.0)
B-Globulin SerPl Elph-Mcnc: 0.7 g/dL (ref 0.7–1.3)
Gamma Glob SerPl Elph-Mcnc: 0.6 g/dL (ref 0.4–1.8)
Globulin, Total: 2.2 g/dL (ref 2.2–3.9)
IgA: 122 mg/dL (ref 64–422)
IgG (Immunoglobin G), Serum: 694 mg/dL (ref 586–1602)
IgM (Immunoglobulin M), Srm: 82 mg/dL (ref 26–217)
Total Protein ELP: 4.8 g/dL — ABNORMAL LOW (ref 6.0–8.5)

## 2022-08-17 ENCOUNTER — Encounter (HOSPITAL_BASED_OUTPATIENT_CLINIC_OR_DEPARTMENT_OTHER): Payer: Medicare Other

## 2022-08-20 ENCOUNTER — Encounter (HOSPITAL_BASED_OUTPATIENT_CLINIC_OR_DEPARTMENT_OTHER): Payer: Medicare Other

## 2022-08-23 ENCOUNTER — Encounter (HOSPITAL_BASED_OUTPATIENT_CLINIC_OR_DEPARTMENT_OTHER): Payer: Medicare Other

## 2022-08-24 DIAGNOSIS — R4182 Altered mental status, unspecified: Secondary | ICD-10-CM | POA: Diagnosis not present

## 2022-08-24 DIAGNOSIS — E440 Moderate protein-calorie malnutrition: Secondary | ICD-10-CM | POA: Diagnosis not present

## 2022-08-24 DIAGNOSIS — I480 Paroxysmal atrial fibrillation: Secondary | ICD-10-CM | POA: Diagnosis not present

## 2022-08-24 DIAGNOSIS — C50911 Malignant neoplasm of unspecified site of right female breast: Secondary | ICD-10-CM | POA: Diagnosis not present

## 2022-08-24 DIAGNOSIS — N1831 Chronic kidney disease, stage 3a: Secondary | ICD-10-CM | POA: Diagnosis not present

## 2022-08-24 DIAGNOSIS — D696 Thrombocytopenia, unspecified: Secondary | ICD-10-CM | POA: Diagnosis not present

## 2022-08-24 DIAGNOSIS — E871 Hypo-osmolality and hyponatremia: Secondary | ICD-10-CM | POA: Diagnosis not present

## 2022-08-27 ENCOUNTER — Encounter (HOSPITAL_BASED_OUTPATIENT_CLINIC_OR_DEPARTMENT_OTHER): Payer: Medicare Other

## 2022-08-28 DIAGNOSIS — I129 Hypertensive chronic kidney disease with stage 1 through stage 4 chronic kidney disease, or unspecified chronic kidney disease: Secondary | ICD-10-CM | POA: Diagnosis not present

## 2022-08-28 DIAGNOSIS — N1831 Chronic kidney disease, stage 3a: Secondary | ICD-10-CM | POA: Diagnosis not present

## 2022-08-28 DIAGNOSIS — E440 Moderate protein-calorie malnutrition: Secondary | ICD-10-CM | POA: Diagnosis not present

## 2022-08-28 DIAGNOSIS — D696 Thrombocytopenia, unspecified: Secondary | ICD-10-CM | POA: Diagnosis not present

## 2022-08-28 DIAGNOSIS — K219 Gastro-esophageal reflux disease without esophagitis: Secondary | ICD-10-CM | POA: Diagnosis not present

## 2022-08-28 DIAGNOSIS — M150 Primary generalized (osteo)arthritis: Secondary | ICD-10-CM | POA: Diagnosis not present

## 2022-08-28 DIAGNOSIS — J4489 Other specified chronic obstructive pulmonary disease: Secondary | ICD-10-CM | POA: Diagnosis not present

## 2022-08-28 DIAGNOSIS — R4182 Altered mental status, unspecified: Secondary | ICD-10-CM | POA: Diagnosis not present

## 2022-08-28 DIAGNOSIS — K5790 Diverticulosis of intestine, part unspecified, without perforation or abscess without bleeding: Secondary | ICD-10-CM | POA: Diagnosis not present

## 2022-08-28 DIAGNOSIS — I480 Paroxysmal atrial fibrillation: Secondary | ICD-10-CM | POA: Diagnosis not present

## 2022-08-28 DIAGNOSIS — Z85820 Personal history of malignant melanoma of skin: Secondary | ICD-10-CM | POA: Diagnosis not present

## 2022-08-28 DIAGNOSIS — G629 Polyneuropathy, unspecified: Secondary | ICD-10-CM | POA: Diagnosis not present

## 2022-08-28 DIAGNOSIS — G43909 Migraine, unspecified, not intractable, without status migrainosus: Secondary | ICD-10-CM | POA: Diagnosis not present

## 2022-08-28 DIAGNOSIS — M797 Fibromyalgia: Secondary | ICD-10-CM | POA: Diagnosis not present

## 2022-08-28 DIAGNOSIS — Z7952 Long term (current) use of systemic steroids: Secondary | ICD-10-CM | POA: Diagnosis not present

## 2022-08-28 DIAGNOSIS — I2510 Atherosclerotic heart disease of native coronary artery without angina pectoris: Secondary | ICD-10-CM | POA: Diagnosis not present

## 2022-08-28 DIAGNOSIS — C50911 Malignant neoplasm of unspecified site of right female breast: Secondary | ICD-10-CM | POA: Diagnosis not present

## 2022-08-28 DIAGNOSIS — K449 Diaphragmatic hernia without obstruction or gangrene: Secondary | ICD-10-CM | POA: Diagnosis not present

## 2022-08-28 DIAGNOSIS — E871 Hypo-osmolality and hyponatremia: Secondary | ICD-10-CM | POA: Diagnosis not present

## 2022-08-28 DIAGNOSIS — E039 Hypothyroidism, unspecified: Secondary | ICD-10-CM | POA: Diagnosis not present

## 2022-08-28 DIAGNOSIS — M353 Polymyalgia rheumatica: Secondary | ICD-10-CM | POA: Diagnosis not present

## 2022-08-28 DIAGNOSIS — I252 Old myocardial infarction: Secondary | ICD-10-CM | POA: Diagnosis not present

## 2022-08-30 ENCOUNTER — Encounter (HOSPITAL_BASED_OUTPATIENT_CLINIC_OR_DEPARTMENT_OTHER): Payer: Medicare Other | Admitting: Physical Therapy

## 2022-09-03 ENCOUNTER — Encounter (HOSPITAL_BASED_OUTPATIENT_CLINIC_OR_DEPARTMENT_OTHER): Payer: Medicare Other

## 2022-09-03 DIAGNOSIS — M353 Polymyalgia rheumatica: Secondary | ICD-10-CM | POA: Diagnosis not present

## 2022-09-03 DIAGNOSIS — I129 Hypertensive chronic kidney disease with stage 1 through stage 4 chronic kidney disease, or unspecified chronic kidney disease: Secondary | ICD-10-CM | POA: Diagnosis not present

## 2022-09-03 DIAGNOSIS — N1831 Chronic kidney disease, stage 3a: Secondary | ICD-10-CM | POA: Diagnosis not present

## 2022-09-03 DIAGNOSIS — C50911 Malignant neoplasm of unspecified site of right female breast: Secondary | ICD-10-CM | POA: Diagnosis not present

## 2022-09-03 DIAGNOSIS — D696 Thrombocytopenia, unspecified: Secondary | ICD-10-CM | POA: Diagnosis not present

## 2022-09-03 DIAGNOSIS — I480 Paroxysmal atrial fibrillation: Secondary | ICD-10-CM | POA: Diagnosis not present

## 2022-09-05 ENCOUNTER — Telehealth: Payer: Self-pay | Admitting: Internal Medicine

## 2022-09-05 NOTE — Telephone Encounter (Signed)
Pt calling in to request 3 Albuterols be sent in

## 2022-09-06 ENCOUNTER — Encounter (HOSPITAL_BASED_OUTPATIENT_CLINIC_OR_DEPARTMENT_OTHER): Payer: Medicare Other

## 2022-09-10 DIAGNOSIS — I480 Paroxysmal atrial fibrillation: Secondary | ICD-10-CM | POA: Diagnosis not present

## 2022-09-10 DIAGNOSIS — N1831 Chronic kidney disease, stage 3a: Secondary | ICD-10-CM | POA: Diagnosis not present

## 2022-09-10 DIAGNOSIS — C50911 Malignant neoplasm of unspecified site of right female breast: Secondary | ICD-10-CM | POA: Diagnosis not present

## 2022-09-10 DIAGNOSIS — D696 Thrombocytopenia, unspecified: Secondary | ICD-10-CM | POA: Diagnosis not present

## 2022-09-10 DIAGNOSIS — I129 Hypertensive chronic kidney disease with stage 1 through stage 4 chronic kidney disease, or unspecified chronic kidney disease: Secondary | ICD-10-CM | POA: Diagnosis not present

## 2022-09-10 DIAGNOSIS — M353 Polymyalgia rheumatica: Secondary | ICD-10-CM | POA: Diagnosis not present

## 2022-09-10 MED ORDER — ALBUTEROL SULFATE HFA 108 (90 BASE) MCG/ACT IN AERS: 2.0000 | INHALATION_SPRAY | RESPIRATORY_TRACT | 0 refills | Status: DC | PRN

## 2022-09-10 NOTE — Telephone Encounter (Signed)
Called and spoke with patient. She is aware that the albuterol has been refilled for her.   Nothing further needed at time of call.

## 2022-09-10 NOTE — Telephone Encounter (Signed)
Albuterol inhaler or albuterol neb solution? Which pharm?  Need to verify these two things before able to refill med   Called the pt and there was no answer- LMTCB

## 2022-09-10 NOTE — Telephone Encounter (Signed)
Patient states needs refill for Albuterol inhaler. Pharmacy is [facility]. Patient phone number is [phone].

## 2022-09-17 DIAGNOSIS — I480 Paroxysmal atrial fibrillation: Secondary | ICD-10-CM | POA: Diagnosis not present

## 2022-09-17 DIAGNOSIS — I129 Hypertensive chronic kidney disease with stage 1 through stage 4 chronic kidney disease, or unspecified chronic kidney disease: Secondary | ICD-10-CM | POA: Diagnosis not present

## 2022-09-17 DIAGNOSIS — M353 Polymyalgia rheumatica: Secondary | ICD-10-CM | POA: Diagnosis not present

## 2022-09-17 DIAGNOSIS — C50911 Malignant neoplasm of unspecified site of right female breast: Secondary | ICD-10-CM | POA: Diagnosis not present

## 2022-09-17 DIAGNOSIS — D696 Thrombocytopenia, unspecified: Secondary | ICD-10-CM | POA: Diagnosis not present

## 2022-09-17 DIAGNOSIS — N1831 Chronic kidney disease, stage 3a: Secondary | ICD-10-CM | POA: Diagnosis not present

## 2022-09-25 ENCOUNTER — Institutional Professional Consult (permissible substitution) (HOSPITAL_BASED_OUTPATIENT_CLINIC_OR_DEPARTMENT_OTHER): Payer: Medicare Other | Admitting: Internal Medicine

## 2022-10-08 ENCOUNTER — Ambulatory Visit (HOSPITAL_BASED_OUTPATIENT_CLINIC_OR_DEPARTMENT_OTHER): Payer: Medicare Other | Attending: Family Medicine | Admitting: Physical Therapy

## 2022-10-08 DIAGNOSIS — R293 Abnormal posture: Secondary | ICD-10-CM | POA: Insufficient documentation

## 2022-10-08 DIAGNOSIS — M6281 Muscle weakness (generalized): Secondary | ICD-10-CM | POA: Diagnosis not present

## 2022-10-08 DIAGNOSIS — M25512 Pain in left shoulder: Secondary | ICD-10-CM | POA: Diagnosis not present

## 2022-10-08 DIAGNOSIS — R262 Difficulty in walking, not elsewhere classified: Secondary | ICD-10-CM | POA: Insufficient documentation

## 2022-10-08 NOTE — Therapy (Signed)
OUTPATIENT PHYSICAL THERAPY LOWER EXTREMITY EVALUATION   Patient Name: Jamal Antrobus MRN: [id number] DOB:03/23/1940, 82 y.o., female Today's Date: 10/09/2022  END OF SESSION:  PT End of Session - 10/08/22 1301     Visit Number 1    Number of Visits 16    Date for PT Re-Evaluation 12/04/22    PT Start Time 0930    PT Stop Time 1012    PT Time Calculation (min) 42 min    Activity Tolerance Patient tolerated treatment well    Behavior During Therapy [facility] for tasks assessed/performed             Past Medical History:  Diagnosis Date   Arthritis    Asthma    daily and prn inhalers; states good control currently   Breast cancer (HCC)    right   CAD S/P percutaneous coronary angioplasty 07/15/2016   COPD (chronic obstructive pulmonary disease) (HCC)    Dyspnea    occ with exertion   Fibromyalgia    GERD (gastroesophageal reflux disease)    no current med.   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   Vitamin D deficiency    Past Surgical History:  Procedure Laterality Date   ABDOMINAL HYSTERECTOMY     partial   APPENDECTOMY     BREAST BIOPSY Left    BREAST CYST EXCISION Left    BREAST EXCISIONAL BIOPSY Left 03/2015   BREAST LUMPECTOMY Right 08/2018   BREAST LUMPECTOMY WITH RADIOACTIVE SEED LOCALIZATION Left 04/07/2015   Procedure: BREAST LUMPECTOMY WITH RADIOACTIVE SEED LOCALIZATION;  Surgeon: Harriette Bouillon, MD;  Location: [facility];  Service: General;  Laterality: Left;   BREAST LUMPECTOMY WITH RADIOACTIVE SEED LOCALIZATION Right 08/13/2018   Procedure: RIGHT BREAST LUMPECTOMY WITH RADIOACTIVE SEED  LOCALIZATION;  Surgeon: Emelia Loron, MD;  Location: [facility];  Service: General;  Laterality: Right;   COLONOSCOPY     CORONARY STENT INTERVENTION N/A 07/15/2016   Procedure: Coronary Stent Intervention;  Surgeon: Tonny Bollman, MD;  Location: [facility] INVASIVE CV LAB;  Service: Cardiovascular;  Laterality: N/A;   ESOPHAGOGASTRODUODENOSCOPY (EGD) WITH PROPOFOL Left 07/25/2016   Procedure: ESOPHAGOGASTRODUODENOSCOPY (EGD) WITH PROPOFOL;  Surgeon: Kerin Salen, MD;  Location: [facility]COPY;  Service: Gastroenterology;  Laterality: Left;   KNEE ARTHROSCOPY Right 12/07/2003   LAPAROSCOPIC SALPINGO OOPHERECTOMY Right 09/08/2003   LEFT HEART CATH N/A 07/15/2016   Procedure: Left Heart Cath;  Surgeon: Tonny Bollman, MD;  Location: [facility] INVASIVE CV LAB;  Service: Cardiovascular;  Laterality: N/A;   LEFT HEART CATH AND CORONARY ANGIOGRAPHY N/A 04/14/2019   Procedure: LEFT HEART CATH AND CORONARY ANGIOGRAPHY;  Surgeon: Kathleene Hazel, MD;  Location: [facility] INVASIVE CV LAB;  Service: Cardiovascular;  Laterality: N/A;   LYSIS OF ADHESION  09/08/2003   NASAL SINUS SURGERY  age 26   RECTAL POLYPECTOMY  10/02/2006   Patient Active Problem List   Diagnosis Date Noted   Atrial tachycardia 08/07/2022   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 08/04/2022   Hyponatremia 08/04/2022   Thrombocytopenia (HCC) 08/04/2022   COPD with acute exacerbation (HCC) 03/11/2022  Non-STEMI (non-ST elevated myocardial infarction) (HCC) 03/11/2022   RSV (respiratory syncytial virus pneumonia) 03/11/2022   Acute bronchiolitis due to respiratory syncytial virus (RSV) 03/11/2022   Coronary artery disease of native artery of native heart with stable angina pectoris (HCC)    Asthma, chronic, moderate persistent, uncomplicated 02/03/2019   DOE (dyspnea on exertion) 07/31/2018   Osteopenia 06/04/2018   Carcinoma of upper-outer quadrant of right breast in female, estrogen receptor positive  (HCC) 05/30/2018   GI bleed 07/25/2016   Melena 07/24/2016   Acute renal insufficiency 07/24/2016   CAD S/P percutaneous coronary angioplasty 07/16/2016   NSTEMI (non-ST elevated myocardial infarction) (HCC) 07/13/2016   Genetic testing 06/09/2015   Family history of breast cancer in female 05/10/2015   History of melanoma 05/10/2015   Atypical lobular hyperplasia of left breast 04/26/2015   Vision problem 01/08/2014   Hiatus hernia syndrome 08/01/2012   Palpitations 10/18/2010   Acute chest pain 10/18/2010   Hypothyroidism 10/18/2010   Dyslipidemia 06/07/2009   Upper airway cough syndrome 03/29/2009   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   GERD 12/05/2006    PCP: Jonell Cluck MD  REFERRING PROVIDER: Jonell Cluck MD  REFERRING DIAG:  Diagnosis  E44.0 (ICD-10-CM) - Moderate protein-calorie malnutrition  J45.909 (ICD-10-CM) - Unspecified asthma, uncomplicated  M35.3 (ICD-10-CM) - Polymyalgia rheumatica  R26.89 (ICD-10-CM) - Other abnormalities of gait and mobility   THERAPY DIAG:  Difficulty in walking, not elsewhere classified  Left shoulder pain, unspecified chronicity  Abnormal posture  Muscle weakness (generalized)  Rationale for Evaluation and Treatment: Rehabilitation  ONSET DATE:  Decreased mobility and endurance since hospitalization but declining even before that   SUBJECTIVE:   SUBJECTIVE STATEMENT: Patient is a 82 y.o. F who was seen today for physical therapy evaluation and treatment for cc of gait and mobility deficits. Pt's limitations are largely due to to cardiopulmonary deficits and general deconditioning. She was being seen fo PT earlier in the year but was hospitalized 2nd to hyponatremia. She has a long history of asthma which certain days, significantly limits her ability to perform daily activity. On rainy days she has increased pelvic and hip pain. She also has a baseline of left shoulder dysfuction. She feels like she is doing a ltitel better  reaching straight out, but she has some difficulty reaching back or out to the side.   PERTINENT HISTORY: Polymyalgia, arthritis, asthma ( worse int he morning); MI 3 years prior; headaches mostly on rainy days; Hashimoto thyroiditis. Old knee scope but doing well.Marland Kitchen  PAIN:  Are you having pain? Yes: NPRS scale: 6/10 Pain location: pelivs/ bilateral hips  Pain description: aching  Aggravating factors: sit to stand transfers Relieving factors: standing and walking improves the stiffness  PRECAUTIONS: None  RED FLAGS: None   WEIGHT BEARING RESTRICTIONS: No  FALLS:  Has patient fallen in last 6 months? No collapsed prior to the hospital  LIVING ENVIRONMENT: 3 steps into the house but has a railing   OCCUPATION: Retired Had a walk in Advertising copywriter put in    PLOF: Independent  PATIENT GOALS:  Strength and general mobility    NEXT MD VISIT:  Nothing scheduled   OBJECTIVE:   DIAGNOSTIC FINDINGS:   CXR: IMPRESSION: No active cardiopulmonary disease.   Stable large hiatal hernia.  PATIENT SURVEYS:  Incorrect set up   COGNITION: Overall cognitive status: Within functional limits for tasks assessed     SENSATION: Neuropathy in the right foot   POSTURE: No Significant postural limitations  PALPATION:     (Blank rows = not tested)  UPPER EXTREMITY ROM:  Active ROM Right eval Left eval  Shoulder flexion  80 degrees with pain   Shoulder extension    Shoulder abduction    Shoulder adduction    Shoulder extension    Shoulder internal rotation    Shoulder external rotation    Elbow flexion    Elbow extension    Wrist flexion    Wrist extension    Wrist ulnar deviation    Wrist radial deviation    Wrist pronation    Wrist supination     (Blank rows = not tested)  LOWER EXTREMITY MMT:  MMT Right eval Left eval  Hip flexion 17.4 16.6  Hip extension    Hip abduction 16.2 18.4  Hip adduction    Hip internal rotation    Hip external rotation     Knee flexion    Knee extension Unable to do 2nd to tenderness in her legs Unable to do 2nd to tenderness in her legs   Ankle dorsiflexion    Ankle plantarflexion    Ankle inversion    Ankle eversion     (Blank rows = not tested)  FUNCTIONAL TESTS:  5x sit to stand test: 11 sec mild pain in the knees   Unable to do 6 min walk test today GAIT: Limited hip rotation with gait; fed forward posture; Vitals   Sao2 96-97%  HR 91    TODAY'S TREATMENT:                                                                                                                              DATE:   Access Code: [email or web address] URL: [email or web address] Date: 10/08/2022 Prepared by: Lorayne Bender  Exercises - Seated Hip Abduction with Resistance  - 1 x daily - 7 x weekly - 3 sets - 10-15 reps - Seated March  - 1 x daily - 7 x weekly - 3 sets - 10-15 reps - Seated Knee Extension with Resistance  - 1 x daily - 7 x weekly - 3 sets - 10-15 reps  PATIENT EDUCATION:  Education details: HEP, symptom management.  Person educated: Patient Education method: Explanation, Demonstration, Tactile cues, Verbal cues, and Handouts Education comprehension: verbalized understanding, returned demonstration, verbal cues required, tactile cues required, and needs further education  HOME EXERCISE PROGRAM: Access Code: [email or web address] URL: [email or web address] Date: 10/08/2022 Prepared by: Lorayne Bender  Exercises - Seated Hip Abduction with Resistance  - 1 x daily - 7 x weekly - 3 sets - 10-15 reps - Seated March  - 1 x daily - 7 x weekly - 3 sets - 10-15 reps - Seated Knee Extension with Resistance  - 1 x daily - 7 x weekly - 3 sets - 10-15 reps  ASSESSMENT:  CLINICAL IMPRESSION: The patient is an 82 year old female who presents to therapy with declining general mobility. She has asthma which limits her  ability to perform daily tasks on certain days. She also has polymyalgia rheumatica which  causes her hip and pelvis pain on some days. She presents with weakness in bilateral LE. We were unable to perform 3 or 6 min 2nd to baseline dyspnea today. She would benefit from skilled therapy to improve endurance and improve her ability to perform daily tasks.  OBJECTIVE IMPAIRMENTS: Abnormal gait, decreased endurance, difficulty walking, decreased ROM, decreased strength, impaired UE functional use, and pain.   ACTIVITY LIMITATIONS: carrying, standing, stairs, and locomotion level  PARTICIPATION LIMITATIONS: meal prep, cleaning, laundry, driving, shopping, community activity, and yard work  PERSONAL FACTORS: 3+ comorbidities: COPD, polymyalgia rheumatica, headahces  are also affecting patient's functional outcome.   REHAB POTENTIAL: Good  CLINICAL DECISION MAKING: Evolving/moderate complexity declining overall functional mobility   EVALUATION COMPLEXITY: Moderate   GOALS: Goals reviewed with patient? Yes  SHORT TERM GOALS: Target date: 11/05/2022   Patient will increase gross bilateral LE strength by 5 lbs  Baseline: Goal status: INITIAL  2.  Patient will perform 3 or 6 minute walk test  Baseline:  Goal status: INITIAL  3.  Patient will be independent with basic HEP  Baseline:  Goal status: INITIAL  4.  Patients active left shoulder flexion will be 110 without pain  Baseline:  Goal status: INITIAL   LONG TERM GOALS: Target date: 12/03/2022    Patient will stand for 15 minutes without dyspnea and hip pain in order to perform ADL's  Baseline:  Goal status: INITIAL  2.  Patient will ambulate community distances without pain or dyspnea  Baseline:  Goal status: INITIAL  3.  Patient will have a compete exercise program to promote endurance and functional mobility  Baseline:  Goal status: INITIAL   PLAN:  PT FREQUENCY: 1-2x/week  PT DURATION: 8 weeks  PLANNED INTERVENTIONS: Therapeutic exercises, Therapeutic activity, Neuromuscular re-education, Balance  training, Gait training, Patient/Family education, Self Care, Joint mobilization, Aquatic Therapy, Dry Needling, Cryotherapy, Moist heat, Taping, Ionotophoresis 4mg /ml Dexamethasone, Manual therapy, and Re-evaluation  PLAN FOR NEXT SESSION: do 3 or 6 minute walk test if able. The patient would benefit from a general exercise program. She also has left shoulder dysfunction. We can give her a program that would help the left shoulder but not get it more irritated. Consider endurance exercises such as the bike or nu-step. Monitor Sao2.    Dessie Coma, PT 10/09/2022, 12:27 PM

## 2022-10-16 ENCOUNTER — Encounter (HOSPITAL_BASED_OUTPATIENT_CLINIC_OR_DEPARTMENT_OTHER): Payer: Self-pay

## 2022-10-16 ENCOUNTER — Ambulatory Visit (HOSPITAL_BASED_OUTPATIENT_CLINIC_OR_DEPARTMENT_OTHER): Payer: Medicare Other | Attending: Family Medicine

## 2022-10-16 DIAGNOSIS — R262 Difficulty in walking, not elsewhere classified: Secondary | ICD-10-CM | POA: Insufficient documentation

## 2022-10-16 DIAGNOSIS — M6281 Muscle weakness (generalized): Secondary | ICD-10-CM | POA: Insufficient documentation

## 2022-10-16 DIAGNOSIS — R293 Abnormal posture: Secondary | ICD-10-CM | POA: Diagnosis not present

## 2022-10-16 DIAGNOSIS — M25512 Pain in left shoulder: Secondary | ICD-10-CM | POA: Insufficient documentation

## 2022-10-16 NOTE — Therapy (Signed)
OUTPATIENT PHYSICAL THERAPY LOWER EXTREMITY EVALUATION   Patient Name: Sonia [name] MRN: [id number] DOB:07/11/1940, 82 y.o., female Today's Date: 10/16/2022  END OF SESSION:  PT End of Session - 10/16/22 1021     Visit Number 2    Number of Visits 16    Date for PT Re-Evaluation 12/04/22    PT Start Time 1020    PT Stop Time 1058    PT Time Calculation (min) 38 min    Activity Tolerance Patient tolerated treatment well    Behavior During Therapy [facility] for tasks assessed/performed             Past Medical History:  Diagnosis Date   Arthritis    Asthma    daily and prn inhalers; states good control currently   Breast cancer (HCC)    right   CAD S/P percutaneous coronary angioplasty 07/15/2016   COPD (chronic obstructive pulmonary disease) (HCC)    Dyspnea    occ with exertion   Fibromyalgia    GERD (gastroesophageal reflux disease)    no current med.   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   Vitamin D deficiency    Past Surgical History:  Procedure Laterality Date   ABDOMINAL HYSTERECTOMY     partial   APPENDECTOMY     BREAST BIOPSY Left    BREAST CYST EXCISION Left    BREAST EXCISIONAL BIOPSY Left 03/2015   BREAST LUMPECTOMY Right 08/2018   BREAST LUMPECTOMY WITH RADIOACTIVE SEED LOCALIZATION Left 04/07/2015   Procedure: BREAST LUMPECTOMY WITH RADIOACTIVE SEED LOCALIZATION;  Surgeon: Harriette Bouillon, MD;  Location: [facility];  Service: General;  Laterality: Left;   BREAST LUMPECTOMY WITH RADIOACTIVE SEED LOCALIZATION Right 08/13/2018   Procedure: RIGHT BREAST LUMPECTOMY WITH RADIOACTIVE SEED  LOCALIZATION;  Surgeon: Emelia Loron, MD;  Location: [facility];  Service: General;  Laterality: Right;   COLONOSCOPY     CORONARY STENT INTERVENTION N/A 07/15/2016   Procedure: Coronary Stent Intervention;  Surgeon: Tonny Bollman, MD;  Location: [facility] INVASIVE CV LAB;  Service: Cardiovascular;  Laterality: N/A;   ESOPHAGOGASTRODUODENOSCOPY (EGD) WITH PROPOFOL Left 07/25/2016   Procedure: ESOPHAGOGASTRODUODENOSCOPY (EGD) WITH PROPOFOL;  Surgeon: Kerin Salen, MD;  Location: [facility] ENDOSCOPY;  Service: Gastroenterology;  Laterality: Left;   KNEE ARTHROSCOPY Right 12/07/2003   LAPAROSCOPIC SALPINGO OOPHERECTOMY Right 09/08/2003   LEFT HEART CATH N/A 07/15/2016   Procedure: Left Heart Cath;  Surgeon: Tonny Bollman, MD;  Location: [facility] INVASIVE CV LAB;  Service: Cardiovascular;  Laterality: N/A;   LEFT HEART CATH AND CORONARY ANGIOGRAPHY N/A 04/14/2019   Procedure: LEFT HEART CATH AND CORONARY ANGIOGRAPHY;  Surgeon: Kathleene Hazel, MD;  Location: [facility] INVASIVE CV LAB;  Service: Cardiovascular;  Laterality: N/A;   LYSIS OF ADHESION  09/08/2003   NASAL SINUS SURGERY  age 61   RECTAL POLYPECTOMY  10/02/2006   Patient Active Problem List   Diagnosis Date Noted   Atrial tachycardia 08/07/2022   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 08/04/2022   Hyponatremia 08/04/2022   Thrombocytopenia (HCC) 08/04/2022   COPD with acute exacerbation (HCC) 03/11/2022  Non-STEMI (non-ST elevated myocardial infarction) (HCC) 03/11/2022   RSV (respiratory syncytial virus pneumonia) 03/11/2022   Acute bronchiolitis due to respiratory syncytial virus (RSV) 03/11/2022   Coronary artery disease of native artery of native heart with stable angina pectoris (HCC)    Asthma, chronic, moderate persistent, uncomplicated 02/03/2019   DOE (dyspnea on exertion) 07/31/2018   Osteopenia 06/04/2018   Carcinoma of upper-outer quadrant of right breast in female, estrogen receptor positive  (HCC) 05/30/2018   GI bleed 07/25/2016   Melena 07/24/2016   Acute renal insufficiency 07/24/2016   CAD S/P percutaneous coronary angioplasty 07/16/2016   NSTEMI (non-ST elevated myocardial infarction) (HCC) 07/13/2016   Genetic testing 06/09/2015   Family history of breast cancer in female 05/10/2015   History of melanoma 05/10/2015   Atypical lobular hyperplasia of left breast 04/26/2015   Vision problem 01/08/2014   Hiatus hernia syndrome 08/01/2012   Palpitations 10/18/2010   Acute chest pain 10/18/2010   Hypothyroidism 10/18/2010   Dyslipidemia 06/07/2009   Upper airway cough syndrome 03/29/2009   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   GERD 12/05/2006    PCP: Jonell Cluck MD  REFERRING PROVIDER: Jonell Cluck MD  REFERRING DIAG:  Diagnosis  E44.0 (ICD-10-CM) - Moderate protein-calorie malnutrition  J45.909 (ICD-10-CM) - Unspecified asthma, uncomplicated  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 No sciatica at entry. L shoulder is a little better, but still painful to reach up certain directions. "I left my inhaler at home today, I never do that."  PERTINENT HISTORY: Polymyalgia, arthritis, asthma ( worse int he morning); MI 3 years prior; headaches mostly on rainy days; Hashimoto thyroiditis. Old knee scope but doing well.Marland Kitchen  PAIN:  Are you having pain? Yes: NPRS scale: 6/10 Pain location: pelivs/ bilateral hips  Pain description: aching  Aggravating factors: sit to stand  transfers Relieving factors: standing and walking improves the stiffness  PRECAUTIONS: None  RED FLAGS: None   WEIGHT BEARING RESTRICTIONS: No  FALLS:  Has patient fallen in last 6 months? No collapsed prior to the hospital  LIVING ENVIRONMENT: 3 steps into the house but has a railing   OCCUPATION: Retired Had a walk in Advertising copywriter put in    PLOF: Independent  PATIENT GOALS:  Strength and general mobility    NEXT MD VISIT:  Nothing scheduled   OBJECTIVE:   DIAGNOSTIC FINDINGS:   CXR: IMPRESSION: No active cardiopulmonary disease.   Stable large hiatal hernia.  PATIENT SURVEYS:  Incorrect set up   COGNITION: Overall cognitive status: Within functional limits for tasks assessed     SENSATION: Neuropathy in the right foot   POSTURE: No Significant postural limitations     PALPATION:     (Blank rows = not tested)  UPPER EXTREMITY ROM:  Active ROM Right eval Left eval  Shoulder flexion  80 degrees with pain   Shoulder extension    Shoulder abduction    Shoulder adduction    Shoulder extension    Shoulder internal rotation    Shoulder external rotation    Elbow flexion    Elbow extension  Wrist flexion    Wrist extension    Wrist ulnar deviation    Wrist radial deviation    Wrist pronation    Wrist supination     (Blank rows = not tested)  LOWER EXTREMITY MMT:  MMT Right eval Left eval  Hip flexion 17.4 16.6  Hip extension    Hip abduction 16.2 18.4  Hip adduction    Hip internal rotation    Hip external rotation    Knee flexion    Knee extension Unable to do 2nd to tenderness in her legs Unable to do 2nd to tenderness in her legs   Ankle dorsiflexion    Ankle plantarflexion    Ankle inversion    Ankle eversion     (Blank rows = not tested)  FUNCTIONAL TESTS:  5x sit to stand test: 11 sec mild pain in the knees   Unable to do 6 min walk test today GAIT: Limited hip rotation with gait; fed forward posture; Vitals   Sao2  96-97%  HR 91    TODAY'S TREATMENT:                                                                                                                              DATE:    8/6: Seated marching x20 Seated LAQ 1.5# 2x10 (increase next time) Seated clam Blue TB 2x10 Seated adductor squeeze 5" 2x10  Standing HR 2x10 Standing march 2x10 Sidestepping at rail no UE (SBA) x2 laps  Sit to stands 2x5 from chair (no UE)  Tandem stance x 20sec ea Romberg stance with EC x30sec (SBA)  Pulleys ea flexion and scaption Ball rolls at plinth x10 ea flexion and scaption Scap retraction x15 Pendulums x10     Access Code: [email or web address] URL: [email or web address] Date: 10/08/2022 Prepared by: Lorayne Bender  Exercises - Seated Hip Abduction with Resistance  - 1 x daily - 7 x weekly - 3 sets - 10-15 reps - Seated March  - 1 x daily - 7 x weekly - 3 sets - 10-15 reps - Seated Knee Extension with Resistance  - 1 x daily - 7 x weekly - 3 sets - 10-15 reps  PATIENT EDUCATION:  Education details: HEP, symptom management.  Person educated: Patient Education method: Explanation, Demonstration, Tactile cues, Verbal cues, and Handouts Education comprehension: verbalized understanding, returned demonstration, verbal cues required, tactile cues required, and needs further education  HOME EXERCISE PROGRAM: Access Code: [email or web address] URL: [email or web address] Date: 10/08/2022 Prepared by: Lorayne Bender  Exercises - Seated Hip Abduction with Resistance  - 1 x daily - 7 x weekly - 3 sets - 10-15 reps - Seated March  - 1 x daily - 7 x weekly - 3 sets - 10-15 reps - Seated Knee Extension with Resistance  - 1 x daily - 7 x weekly - 3 sets - 10-15 reps  ASSESSMENT:  CLINICAL IMPRESSION: Monitored dyspnea due to pt forgetting her inhaler. Tried to alternate standing  and seated exercises to avoid any SOB. Pt had good tolerance for standing tasks. Fatigued in lateral hips with side  stepping. Some shoulder discomfort with AAROM activities. Palpable crepitus with certain movements. Pt has rounded posture so encouraged retracted positioning with education on postural awareness.  OBJECTIVE IMPAIRMENTS: Abnormal gait, decreased endurance, difficulty walking, decreased ROM, decreased strength, impaired UE functional use, and pain.   ACTIVITY LIMITATIONS: carrying, standing, stairs, and locomotion level  PARTICIPATION LIMITATIONS: meal prep, cleaning, laundry, driving, shopping, community activity, and yard work  PERSONAL FACTORS: 3+ comorbidities: COPD, polymyalgia rheumatica, headahces  are also affecting patient's functional outcome.   REHAB POTENTIAL: Good  CLINICAL DECISION MAKING: Evolving/moderate complexity declining overall functional mobility   EVALUATION COMPLEXITY: Moderate   GOALS: Goals reviewed with patient? Yes  SHORT TERM GOALS: Target date: 11/05/2022   Patient will increase gross bilateral LE strength by 5 lbs  Baseline: Goal status: INITIAL  2.  Patient will perform 3 or 6 minute walk test  Baseline:  Goal status: INITIAL  3.  Patient will be independent with basic HEP  Baseline:  Goal status: INITIAL  4.  Patients active left shoulder flexion will be 110 without pain  Baseline:  Goal status: INITIAL   LONG TERM GOALS: Target date: 12/03/2022    Patient will stand for 15 minutes without dyspnea and hip pain in order to perform ADL's  Baseline:  Goal status: INITIAL  2.  Patient will ambulate community distances without pain or dyspnea  Baseline:  Goal status: INITIAL  3.  Patient will have a compete exercise program to promote endurance and functional mobility  Baseline:  Goal status: INITIAL   PLAN:  PT FREQUENCY: 1-2x/week  PT DURATION: 8 weeks  PLANNED INTERVENTIONS: Therapeutic exercises, Therapeutic activity, Neuromuscular re-education, Balance training, Gait training, Patient/Family education, Self Care, Joint  mobilization, Aquatic Therapy, Dry Needling, Cryotherapy, Moist heat, Taping, Ionotophoresis 4mg /ml Dexamethasone, Manual therapy, and Re-evaluation  PLAN FOR NEXT SESSION: do 3 or 6 minute walk test if able. The patient would benefit from a general exercise program. She also has left shoulder dysfunction. We can give her a program that would help the left shoulder but not get it more irritated. Consider endurance exercises such as the bike or nu-step. Monitor Sao2.    Donnel Saxon , PTA 10/16/2022, 11:49 AM

## 2022-10-19 ENCOUNTER — Ambulatory Visit (HOSPITAL_BASED_OUTPATIENT_CLINIC_OR_DEPARTMENT_OTHER): Payer: Medicare Other | Admitting: Physical Therapy

## 2022-10-22 ENCOUNTER — Ambulatory Visit (HOSPITAL_BASED_OUTPATIENT_CLINIC_OR_DEPARTMENT_OTHER): Payer: Medicare Other | Admitting: Physical Therapy

## 2022-10-24 ENCOUNTER — Ambulatory Visit (HOSPITAL_BASED_OUTPATIENT_CLINIC_OR_DEPARTMENT_OTHER): Payer: Medicare Other | Admitting: Physical Therapy

## 2022-10-24 ENCOUNTER — Ambulatory Visit: Payer: Medicare Other | Admitting: Internal Medicine

## 2022-10-26 ENCOUNTER — Ambulatory Visit: Payer: Medicare Other | Admitting: Internal Medicine

## 2022-10-30 ENCOUNTER — Encounter (HOSPITAL_BASED_OUTPATIENT_CLINIC_OR_DEPARTMENT_OTHER): Payer: Self-pay

## 2022-10-30 ENCOUNTER — Ambulatory Visit (HOSPITAL_BASED_OUTPATIENT_CLINIC_OR_DEPARTMENT_OTHER): Payer: Medicare Other

## 2022-10-30 DIAGNOSIS — M6281 Muscle weakness (generalized): Secondary | ICD-10-CM

## 2022-10-30 DIAGNOSIS — R293 Abnormal posture: Secondary | ICD-10-CM | POA: Diagnosis not present

## 2022-10-30 DIAGNOSIS — M25512 Pain in left shoulder: Secondary | ICD-10-CM | POA: Diagnosis not present

## 2022-10-30 DIAGNOSIS — R262 Difficulty in walking, not elsewhere classified: Secondary | ICD-10-CM | POA: Diagnosis not present

## 2022-10-30 NOTE — Therapy (Signed)
OUTPATIENT PHYSICAL THERAPY LOWER EXTREMITY TREATMENT   Patient Name: Michelina Stump MRN: [id number] DOB:09-Sep-1940, 82 y.o., female Today's Date: 10/30/2022  END OF SESSION:  PT End of Session - 10/30/22 1055     Visit Number 3    Number of Visits 16    Date for PT Re-Evaluation 12/04/22    PT Start Time 1100    PT Stop Time 1145    PT Time Calculation (min) 45 min    Activity Tolerance Patient tolerated treatment well    Behavior During Therapy [facility] for tasks assessed/performed              Past Medical History:  Diagnosis Date   Arthritis    Asthma    daily and prn inhalers; states good control currently   Breast cancer (HCC)    right   CAD S/P percutaneous coronary angioplasty 07/15/2016   COPD (chronic obstructive pulmonary disease) (HCC)    Dyspnea    occ with exertion   Fibromyalgia    GERD (gastroesophageal reflux disease)    no current med.   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   Vitamin D deficiency    Past Surgical History:  Procedure Laterality Date   ABDOMINAL HYSTERECTOMY     partial   APPENDECTOMY     BREAST BIOPSY Left    BREAST CYST EXCISION Left    BREAST EXCISIONAL BIOPSY Left 03/2015   BREAST LUMPECTOMY Right 08/2018   BREAST LUMPECTOMY WITH RADIOACTIVE SEED LOCALIZATION Left 04/07/2015   Procedure: BREAST LUMPECTOMY WITH RADIOACTIVE SEED LOCALIZATION;  Surgeon: Harriette Bouillon, MD;  Location: [facility];  Service: General;  Laterality: Left;   BREAST LUMPECTOMY WITH RADIOACTIVE SEED LOCALIZATION Right 08/13/2018   Procedure: RIGHT BREAST LUMPECTOMY WITH RADIOACTIVE SEED  LOCALIZATION;  Surgeon: Emelia Loron, MD;  Location: [facility];  Service: General;  Laterality: Right;   COLONOSCOPY     CORONARY STENT INTERVENTION N/A 07/15/2016   Procedure: Coronary Stent Intervention;  Surgeon: Tonny Bollman, MD;  Location: [facility] INVASIVE CV LAB;  Service: Cardiovascular;  Laterality: N/A;   ESOPHAGOGASTRODUODENOSCOPY (EGD) WITH PROPOFOL Left 07/25/2016   Procedure: ESOPHAGOGASTRODUODENOSCOPY (EGD) WITH PROPOFOL;  Surgeon: Kerin Salen, MD;  Location: [facility] ENDOSCOPY;  Service: Gastroenterology;  Laterality: Left;   KNEE ARTHROSCOPY Right 12/07/2003   LAPAROSCOPIC SALPINGO OOPHERECTOMY Right 09/08/2003   LEFT HEART CATH N/A 07/15/2016   Procedure: Left Heart Cath;  Surgeon: Tonny Bollman, MD;  Location: [facility] INVASIVE CV LAB;  Service: Cardiovascular;  Laterality: N/A;   LEFT HEART CATH AND CORONARY ANGIOGRAPHY N/A 04/14/2019   Procedure: LEFT HEART CATH AND CORONARY ANGIOGRAPHY;  Surgeon: Kathleene Hazel, MD;  Location: [facility] INVASIVE CV LAB;  Service: Cardiovascular;  Laterality: N/A;   LYSIS OF ADHESION  09/08/2003   NASAL SINUS SURGERY  age 29   RECTAL POLYPECTOMY  10/02/2006   Patient Active Problem List   Diagnosis Date Noted   Atrial tachycardia 08/07/2022   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 08/04/2022   Hyponatremia 08/04/2022   Thrombocytopenia (HCC) 08/04/2022   COPD with acute exacerbation (HCC)  03/11/2022   Non-STEMI (non-ST elevated myocardial infarction) (HCC) 03/11/2022   RSV (respiratory syncytial virus pneumonia) 03/11/2022   Acute bronchiolitis due to respiratory syncytial virus (RSV) 03/11/2022   Coronary artery disease of native artery of native heart with stable angina pectoris (HCC)    Asthma, chronic, moderate persistent, uncomplicated 02/03/2019   DOE (dyspnea on exertion) 07/31/2018   Osteopenia 06/04/2018   Carcinoma of upper-outer quadrant of right breast in female, estrogen receptor positive  (HCC) 05/30/2018   GI bleed 07/25/2016   Melena 07/24/2016   Acute renal insufficiency 07/24/2016   CAD S/P percutaneous coronary angioplasty 07/16/2016   NSTEMI (non-ST elevated myocardial infarction) (HCC) 07/13/2016   Genetic testing 06/09/2015   Family history of breast cancer in female 05/10/2015   History of melanoma 05/10/2015   Atypical lobular hyperplasia of left breast 04/26/2015   Vision problem 01/08/2014   Hiatus hernia syndrome 08/01/2012   Palpitations 10/18/2010   Acute chest pain 10/18/2010   Hypothyroidism 10/18/2010   Dyslipidemia 06/07/2009   Upper airway cough syndrome 03/29/2009   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   GERD 12/05/2006    PCP: Jonell Cluck MD  REFERRING PROVIDER: Jonell Cluck MD  REFERRING DIAG:  Diagnosis  E44.0 (ICD-10-CM) - Moderate protein-calorie malnutrition  J45.909 (ICD-10-CM) - Unspecified asthma, uncomplicated  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 She arrives with sciatica in R hip which goes from posterior hip down posterior thigh, stopping above the knee. 5/10 pain level "if not more."  PERTINENT HISTORY: Polymyalgia, arthritis, asthma ( worse int he morning); MI 3 years prior; headaches mostly on rainy days; Hashimoto thyroiditis. Old knee scope but doing well.Marland Kitchen  PAIN:  Are you having pain? Yes: NPRS scale: 6/10 Pain location: pelivs/ bilateral hips  Pain description: aching  Aggravating factors: sit to stand transfers Relieving factors:  standing and walking improves the stiffness  PRECAUTIONS: None  RED FLAGS: None   WEIGHT BEARING RESTRICTIONS: No  FALLS:  Has patient fallen in last 6 months? No collapsed prior to the hospital  LIVING ENVIRONMENT: 3 steps into the house but has a railing   OCCUPATION: Retired Had a walk in Advertising copywriter put in    PLOF: Independent  PATIENT GOALS:  Strength and general mobility    NEXT MD VISIT:  Nothing scheduled   OBJECTIVE:   DIAGNOSTIC FINDINGS:   CXR: IMPRESSION: No active cardiopulmonary disease.   Stable large hiatal hernia.  PATIENT SURVEYS:  Incorrect set up   COGNITION: Overall cognitive status: Within functional limits for tasks assessed     SENSATION: Neuropathy in the right foot   POSTURE: No Significant postural limitations     PALPATION:     (Blank rows = not tested)  UPPER EXTREMITY ROM:  Active ROM Right eval Left eval  Shoulder flexion  80 degrees with pain   Shoulder extension    Shoulder abduction    Shoulder adduction    Shoulder extension    Shoulder internal rotation    Shoulder external rotation    Elbow flexion    Elbow extension    Wrist flexion  Wrist extension    Wrist ulnar deviation    Wrist radial deviation    Wrist pronation    Wrist supination     (Blank rows = not tested)  LOWER EXTREMITY MMT:  MMT Right eval Left eval  Hip flexion 17.4 16.6  Hip extension    Hip abduction 16.2 18.4  Hip adduction    Hip internal rotation    Hip external rotation    Knee flexion    Knee extension Unable to do 2nd to tenderness in her legs Unable to do 2nd to tenderness in her legs   Ankle dorsiflexion    Ankle plantarflexion    Ankle inversion    Ankle eversion     (Blank rows = not tested)  FUNCTIONAL TESTS:  5x sit to stand test: 11 sec mild pain in the knees   Unable to do 6 min walk test today GAIT: Limited hip rotation with gait; fed forward posture; Vitals   Sao2 96-97%  HR 91    TODAY'S  TREATMENT:                                                                                                                              DATE:     8/20:  STM to R gluteal mm, ITB, lateral HS, QL  Manual stretch R HS, piriformis/fig 4  Nerve mobilizaiton in supine with manual HS stretch and ankle pumps x10  Seated LAQ 2.5# 5" hold 2x10 ea Seated QL stretch  Discussion of daily activity modifications and foot wear    8/6: Seated marching x20 Seated LAQ 1.5# 2x10 (increase next time) Seated clam Blue TB 2x10 Seated adductor squeeze 5" 2x10  Standing HR 2x10 Standing march 2x10 Sidestepping at rail no UE (SBA) x2 laps  Sit to stands 2x5 from chair (no UE)  Tandem stance x 20sec ea Romberg stance with EC x30sec (SBA)  Pulleys ea flexion and scaption Ball rolls at plinth x10 ea flexion and scaption Scap retraction x15 Pendulums x10     Access Code: [email or web address] URL: [email or web address] Date: 10/08/2022 Prepared by: Lorayne Bender  Exercises - Seated Hip Abduction with Resistance  - 1 x daily - 7 x weekly - 3 sets - 10-15 reps - Seated March  - 1 x daily - 7 x weekly - 3 sets - 10-15 reps - Seated Knee Extension with Resistance  - 1 x daily - 7 x weekly - 3 sets - 10-15 reps  PATIENT EDUCATION:  Education details: HEP, symptom management.  Person educated: Patient Education method: Explanation, Demonstration, Tactile cues, Verbal cues, and Handouts Education comprehension: verbalized understanding, returned demonstration, verbal cues required, tactile cues required, and needs further education  HOME EXERCISE PROGRAM: Access Code: [email or web address] URL: [email or web address] Date: 10/08/2022 Prepared by: Lorayne Bender  Exercises - Seated Hip Abduction with Resistance  - 1 x daily - 7 x weekly - 3 sets - 10-15 reps - Seated March  - 1  x daily - 7 x weekly - 3 sets - 10-15 reps - Seated Knee Extension with Resistance  - 1 x daily - 7 x weekly  - 3 sets - 10-15 reps  ASSESSMENT:  CLINICAL IMPRESSION: Pt requested to focus on sciatic sx today, so spent time on manual techniques to address this. PT demonstrates extreme sensitivity to palpation of lateral R hip and thigh. Gentle manual pressure utilized. Unable to tolerate STM to QL so spent minimal time on this. Instructed pt in seated QL stretch to perform at home. She remains tight into hip ER and benefited from fig. 4 stretch. Pt has had same sneakers for ~10 years. She has new pair of sneakers at home and will try those with her orthotics in them to see if this improves. Will continue to monitor sx and progress as able.   OBJECTIVE IMPAIRMENTS: Abnormal gait, decreased endurance, difficulty walking, decreased ROM, decreased strength, impaired UE functional use, and pain.   ACTIVITY LIMITATIONS: carrying, standing, stairs, and locomotion level  PARTICIPATION LIMITATIONS: meal prep, cleaning, laundry, driving, shopping, community activity, and yard work  PERSONAL FACTORS: 3+ comorbidities: COPD, polymyalgia rheumatica, headahces  are also affecting patient's functional outcome.   REHAB POTENTIAL: Good  CLINICAL DECISION MAKING: Evolving/moderate complexity declining overall functional mobility   EVALUATION COMPLEXITY: Moderate   GOALS: Goals reviewed with patient? Yes  SHORT TERM GOALS: Target date: 11/05/2022   Patient will increase gross bilateral LE strength by 5 lbs  Baseline: Goal status: INITIAL  2.  Patient will perform 3 or 6 minute walk test  Baseline:  Goal status: INITIAL  3.  Patient will be independent with basic HEP  Baseline:  Goal status: INITIAL  4.  Patients active left shoulder flexion will be 110 without pain  Baseline:  Goal status: INITIAL   LONG TERM GOALS: Target date: 12/03/2022    Patient will stand for 15 minutes without dyspnea and hip pain in order to perform ADL's  Baseline:  Goal status: INITIAL  2.  Patient will ambulate  community distances without pain or dyspnea  Baseline:  Goal status: INITIAL  3.  Patient will have a compete exercise program to promote endurance and functional mobility  Baseline:  Goal status: INITIAL   PLAN:  PT FREQUENCY: 1-2x/week  PT DURATION: 8 weeks  PLANNED INTERVENTIONS: Therapeutic exercises, Therapeutic activity, Neuromuscular re-education, Balance training, Gait training, Patient/Family education, Self Care, Joint mobilization, Aquatic Therapy, Dry Needling, Cryotherapy, Moist heat, Taping, Ionotophoresis 4mg /ml Dexamethasone, Manual therapy, and Re-evaluation  PLAN FOR NEXT SESSION: do 3 or 6 minute walk test if able. The patient would benefit from a general exercise program. She also has left shoulder dysfunction. We can give her a program that would help the left shoulder but not get it more irritated. Consider endurance exercises such as the bike or nu-step. Monitor Sao2.    Donnel Saxon Debbera Wolken, PTA 10/30/2022, 2:01 PM

## 2022-11-02 ENCOUNTER — Encounter (HOSPITAL_BASED_OUTPATIENT_CLINIC_OR_DEPARTMENT_OTHER): Payer: Self-pay

## 2022-11-02 ENCOUNTER — Ambulatory Visit (HOSPITAL_BASED_OUTPATIENT_CLINIC_OR_DEPARTMENT_OTHER): Payer: Medicare Other

## 2022-11-02 DIAGNOSIS — R262 Difficulty in walking, not elsewhere classified: Secondary | ICD-10-CM | POA: Diagnosis not present

## 2022-11-02 DIAGNOSIS — R293 Abnormal posture: Secondary | ICD-10-CM | POA: Diagnosis not present

## 2022-11-02 DIAGNOSIS — M6281 Muscle weakness (generalized): Secondary | ICD-10-CM

## 2022-11-02 DIAGNOSIS — M25512 Pain in left shoulder: Secondary | ICD-10-CM

## 2022-11-02 NOTE — Therapy (Signed)
OUTPATIENT PHYSICAL THERAPY LOWER EXTREMITY TREATMENT   Patient Name: Sonia [name] MRN: [id number] DOB:1940/07/16, 82 y.o., female Today's Date: 11/02/2022  END OF SESSION:  PT End of Session - 11/02/22 1451     Visit Number 4    Number of Visits 16    Date for PT Re-Evaluation 12/04/22    PT Start Time 1302    PT Stop Time 1345    PT Time Calculation (min) 43 min    Activity Tolerance Patient tolerated treatment well    Behavior During Therapy [facility] for tasks assessed/performed               Past Medical History:  Diagnosis Date   Arthritis    Asthma    daily and prn inhalers; states good control currently   Breast cancer (HCC)    right   CAD S/P percutaneous coronary angioplasty 07/15/2016   COPD (chronic obstructive pulmonary disease) (HCC)    Dyspnea    occ with exertion   Fibromyalgia    GERD (gastroesophageal reflux disease)    no current med.   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   Vitamin D deficiency    Past Surgical History:  Procedure Laterality Date   ABDOMINAL HYSTERECTOMY     partial   APPENDECTOMY     BREAST BIOPSY Left    BREAST CYST EXCISION Left    BREAST EXCISIONAL BIOPSY Left 03/2015   BREAST LUMPECTOMY Right 08/2018   BREAST LUMPECTOMY WITH RADIOACTIVE SEED LOCALIZATION Left 04/07/2015   Procedure: BREAST LUMPECTOMY WITH RADIOACTIVE SEED LOCALIZATION;  Surgeon: Harriette Bouillon, MD;  Location: [facility];  Service: General;  Laterality: Left;   BREAST LUMPECTOMY WITH RADIOACTIVE SEED LOCALIZATION Right 08/13/2018   Procedure: RIGHT BREAST LUMPECTOMY WITH RADIOACTIVE SEED  LOCALIZATION;  Surgeon: Emelia Loron, MD;  Location: [facility];  Service: General;  Laterality: Right;   COLONOSCOPY     CORONARY STENT INTERVENTION N/A 07/15/2016   Procedure: Coronary Stent Intervention;  Surgeon: Tonny Bollman, MD;  Location: [facility] INVASIVE CV LAB;  Service: Cardiovascular;  Laterality: N/A;   ESOPHAGOGASTRODUODENOSCOPY (EGD) WITH PROPOFOL Left 07/25/2016   Procedure: ESOPHAGOGASTRODUODENOSCOPY (EGD) WITH PROPOFOL;  Surgeon: Kerin Salen, MD;  Location: [facility] ENDOSCOPY;  Service: Gastroenterology;  Laterality: Left;   KNEE ARTHROSCOPY Right 12/07/2003   LAPAROSCOPIC SALPINGO OOPHERECTOMY Right 09/08/2003   LEFT HEART CATH N/A 07/15/2016   Procedure: Left Heart Cath;  Surgeon: Tonny Bollman, MD;  Location: [facility] 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 08/04/2022   Hyponatremia 08/04/2022   Thrombocytopenia (HCC) 08/04/2022   COPD with acute exacerbation (  HCC) 03/11/2022   Non-STEMI (non-ST elevated myocardial infarction) (HCC) 03/11/2022   RSV (respiratory syncytial virus pneumonia) 03/11/2022   Acute bronchiolitis due to respiratory syncytial virus (RSV) 03/11/2022   Coronary artery disease of native artery of native heart with stable angina pectoris (HCC)    Asthma, chronic, moderate persistent, uncomplicated 02/03/2019   DOE (dyspnea on exertion) 07/31/2018   Osteopenia 06/04/2018   Carcinoma of upper-outer quadrant of right breast in female, estrogen receptor positive  (HCC) 05/30/2018   GI bleed 07/25/2016   Melena 07/24/2016   Acute renal insufficiency 07/24/2016   CAD S/P percutaneous coronary angioplasty 07/16/2016   NSTEMI (non-ST elevated myocardial infarction) (HCC) 07/13/2016   Genetic testing 06/09/2015   Family history of breast cancer in female 05/10/2015   History of melanoma 05/10/2015   Atypical lobular hyperplasia of left breast 04/26/2015   Vision problem 01/08/2014   Hiatus hernia syndrome 08/01/2012   Palpitations 10/18/2010   Acute chest pain 10/18/2010   Hypothyroidism 10/18/2010   Dyslipidemia 06/07/2009   Upper airway cough syndrome 03/29/2009   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   GERD 12/05/2006    PCP: Jonell Cluck MD  REFERRING PROVIDER: Jonell Cluck MD  REFERRING DIAG:  Diagnosis  E44.0 (ICD-10-CM) - Moderate protein-calorie malnutrition  J45.909 (ICD-10-CM) - Unspecified asthma, uncomplicated  M35.3 (ICD-10-CM) - Polymyalgia rheumatica  R26.89 (ICD-10-CM) - Other abnormalities of gait and mobility   THERAPY DIAG:  Difficulty in walking, not elsewhere classified  Left shoulder pain, unspecified chronicity  Abnormal posture  Muscle weakness (generalized)  Rationale for Evaluation and Treatment: Rehabilitation  ONSET DATE:  Decreased mobility and endurance since hospitalization but declining even before that   SUBJECTIVE:   SUBJECTIVE STATEMENT: Pt reports no sciatic symptoms the night after PT, but did have some yesterday. The exercises seem to help. Some L sided tightness in neck and shoulder today with headache present. She arrives donning new tennis shoes as instructed last visit, which feel good. "I felt kind of weird when I got out of the car." Pt reports mild "off balance" feeling. She reports she has eaten today, but did not drink any water. Provided her with water bottle in clinic.   PERTINENT HISTORY: Polymyalgia, arthritis, asthma ( worse int he morning); MI 3 years prior; headaches mostly on  rainy days; Hashimoto thyroiditis. Old knee scope but doing well.Marland Kitchen  PAIN:  Are you having pain? Yes: NPRS scale: 6/10 Pain location: pelivs/ bilateral hips  Pain description: aching  Aggravating factors: sit to stand transfers Relieving factors: standing and walking improves the stiffness  PRECAUTIONS: None  RED FLAGS: None   WEIGHT BEARING RESTRICTIONS: No  FALLS:  Has patient fallen in last 6 months? No collapsed prior to the hospital  LIVING ENVIRONMENT: 3 steps into the house but has a railing   OCCUPATION: Retired Had a walk in Advertising copywriter put in    PLOF: Independent  PATIENT GOALS:  Strength and general mobility    NEXT MD VISIT:  Nothing scheduled   OBJECTIVE:   DIAGNOSTIC FINDINGS:   CXR: IMPRESSION: No active cardiopulmonary disease.   Stable large hiatal hernia.  PATIENT SURVEYS:  Incorrect set up   COGNITION: Overall cognitive status: Within functional limits for tasks assessed     SENSATION: Neuropathy in the right foot   POSTURE: No Significant postural limitations     PALPATION:     (Blank rows = not tested)  UPPER EXTREMITY ROM:  Active ROM Right eval Left eval  Shoulder flexion  80  degrees with pain   Shoulder extension    Shoulder abduction    Shoulder adduction    Shoulder extension    Shoulder internal rotation    Shoulder external rotation    Elbow flexion    Elbow extension    Wrist flexion    Wrist extension    Wrist ulnar deviation    Wrist radial deviation    Wrist pronation    Wrist supination     (Blank rows = not tested)  LOWER EXTREMITY MMT:  MMT Right eval Left eval  Hip flexion 17.4 16.6  Hip extension    Hip abduction 16.2 18.4  Hip adduction    Hip internal rotation    Hip external rotation    Knee flexion    Knee extension Unable to do 2nd to tenderness in her legs Unable to do 2nd to tenderness in her legs   Ankle dorsiflexion    Ankle plantarflexion    Ankle inversion    Ankle eversion      (Blank rows = not tested)  FUNCTIONAL TESTS:  5x sit to stand test: 11 sec mild pain in the knees   Unable to do 6 min walk test today GAIT: Limited hip rotation with gait; fed forward posture; Vitals   Sao2 96-97%  HR 91    TODAY'S TREATMENT:                                                                                                                              DATE:    8/23:  STM L UT UT stretch 30s x3L Scap retraction 3" 2x10 Door way stretch 10s x3L  Piriformis stretch 30s x2ea Discussion of posture with reading and laying in bed Supine SLR 2x10ea Update and review of HEP      8/20:  STM to R gluteal mm, ITB, lateral HS, QL  Manual stretch R HS, piriformis/fig 4  Nerve mobilizaiton in supine with manual HS stretch and ankle pumps x10  Seated LAQ 2.5# 5" hold 2x10 ea Seated QL stretch  Discussion of daily activity modifications and foot wear    8/6: Seated marching x20 Seated LAQ 1.5# 2x10 (increase next time) Seated clam Blue TB 2x10 Seated adductor squeeze 5" 2x10  Standing HR 2x10 Standing march 2x10 Sidestepping at rail no UE (SBA) x2 laps  Sit to stands 2x5 from chair (no UE)  Tandem stance x 20sec ea Romberg stance with EC x30sec (SBA)  Pulleys ea flexion and scaption Ball rolls at plinth x10 ea flexion and scaption Scap retraction x15 Pendulums x10     Access Code: [email or web address] URL: [email or web address] Date: 10/08/2022 Prepared by: Lorayne Bender  Exercises - Seated Hip Abduction with Resistance  - 1 x daily - 7 x weekly - 3 sets - 10-15 reps - Seated March  - 1 x daily - 7 x weekly - 3 sets - 10-15 reps - Seated Knee Extension with Resistance  -  1 x daily - 7 x weekly - 3 sets - 10-15 reps  PATIENT EDUCATION:  Education details: HEP, symptom management.  Person educated: Patient Education method: Explanation, Demonstration, Tactile cues, Verbal cues, and Handouts Education comprehension: verbalized  understanding, returned demonstration, verbal cues required, tactile cues required, and needs further education  HOME EXERCISE PROGRAM: Access Code: [email or web address] URL: [email or web address] Date: 10/08/2022 Prepared by: Lorayne Bender  Exercises - Seated Hip Abduction with Resistance  - 1 x daily - 7 x weekly - 3 sets - 10-15 reps - Seated March  - 1 x daily - 7 x weekly - 3 sets - 10-15 reps - Seated Knee Extension with Resistance  - 1 x daily - 7 x weekly - 3 sets - 10-15 reps  ASSESSMENT:  CLINICAL IMPRESSION: Pt had good tolerance to STM to L UT, though significantly tender to this area. Reported improved pain level and decreased headache following this. She was instructed in UT stretch to add to HEP. Also discussed postioning for reading, which pt reports she usually does while laying in bed on L side. She has a chair that will be more appropriate for reading. Had mild difficulty with sit to stand transfer at end of session due to bil knee pain. No significant unsteadiness noted with session, but cued pt to utilize slow and careful movements. Will motnior these sx. Will continue to progress as tolerated.   OBJECTIVE IMPAIRMENTS: Abnormal gait, decreased endurance, difficulty walking, decreased ROM, decreased strength, impaired UE functional use, and pain.   ACTIVITY LIMITATIONS: carrying, standing, stairs, and locomotion level  PARTICIPATION LIMITATIONS: meal prep, cleaning, laundry, driving, shopping, community activity, and yard work  PERSONAL FACTORS: 3+ comorbidities: COPD, polymyalgia rheumatica, headahces  are also affecting patient's functional outcome.   REHAB POTENTIAL: Good  CLINICAL DECISION MAKING: Evolving/moderate complexity declining overall functional mobility   EVALUATION COMPLEXITY: Moderate   GOALS: Goals reviewed with patient? Yes  SHORT TERM GOALS: Target date: 11/05/2022   Patient will increase gross bilateral LE strength by 5 lbs   Baseline: Goal status: INITIAL  2.  Patient will perform 3 or 6 minute walk test  Baseline:  Goal status: INITIAL  3.  Patient will be independent with basic HEP  Baseline:  Goal status: INITIAL  4.  Patients active left shoulder flexion will be 110 without pain  Baseline:  Goal status: INITIAL   LONG TERM GOALS: Target date: 12/03/2022    Patient will stand for 15 minutes without dyspnea and hip pain in order to perform ADL's  Baseline:  Goal status: INITIAL  2.  Patient will ambulate community distances without pain or dyspnea  Baseline:  Goal status: INITIAL  3.  Patient will have a compete exercise program to promote endurance and functional mobility  Baseline:  Goal status: INITIAL   PLAN:  PT FREQUENCY: 1-2x/week  PT DURATION: 8 weeks  PLANNED INTERVENTIONS: Therapeutic exercises, Therapeutic activity, Neuromuscular re-education, Balance training, Gait training, Patient/Family education, Self Care, Joint mobilization, Aquatic Therapy, Dry Needling, Cryotherapy, Moist heat, Taping, Ionotophoresis 4mg /ml Dexamethasone, Manual therapy, and Re-evaluation  PLAN FOR NEXT SESSION: do 3 or 6 minute walk test if able. The patient would benefit from a general exercise program. She also has left shoulder dysfunction. We can give her a program that would help the left shoulder but not get it more irritated. Consider endurance exercises such as the bike or nu-step. Monitor Sao2.    Donnel Saxon Jalal Rauch, PTA 11/02/2022, 3:02 PM

## 2022-11-06 ENCOUNTER — Ambulatory Visit (HOSPITAL_BASED_OUTPATIENT_CLINIC_OR_DEPARTMENT_OTHER): Payer: Medicare Other | Admitting: Physical Therapy

## 2022-11-06 DIAGNOSIS — R262 Difficulty in walking, not elsewhere classified: Secondary | ICD-10-CM | POA: Diagnosis not present

## 2022-11-06 DIAGNOSIS — M25512 Pain in left shoulder: Secondary | ICD-10-CM | POA: Diagnosis not present

## 2022-11-06 DIAGNOSIS — M6281 Muscle weakness (generalized): Secondary | ICD-10-CM | POA: Diagnosis not present

## 2022-11-06 DIAGNOSIS — R293 Abnormal posture: Secondary | ICD-10-CM | POA: Diagnosis not present

## 2022-11-06 NOTE — Therapy (Unsigned)
OUTPATIENT PHYSICAL THERAPY LOWER EXTREMITY TREATMENT   Patient Name: Chaniya Custer MRN: [id number] DOB:[birth year], 82 y.o., female Today's Date: 11/07/2022  END OF SESSION:  PT End of Session - 11/07/22 0748     Visit Number 5    Number of Visits 16    Date for PT Re-Evaluation 12/04/22    PT Start Time 1100    PT Stop Time 1142    PT Time Calculation (min) 42 min    Activity Tolerance Patient tolerated treatment well    Behavior During Therapy [facility] for tasks assessed/performed                Past Medical History:  Diagnosis Date   Arthritis    Asthma    daily and prn inhalers; states good control currently   Breast cancer (HCC)    right   CAD S/P percutaneous coronary angioplasty 07/15/2016   COPD (chronic obstructive pulmonary disease) (HCC)    Dyspnea    occ with exertion   Fibromyalgia    GERD (gastroesophageal reflux disease)    no current med.   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   Vitamin D deficiency    Past Surgical History:  Procedure Laterality Date   ABDOMINAL HYSTERECTOMY     partial   APPENDECTOMY     BREAST BIOPSY Left    BREAST CYST EXCISION Left    BREAST EXCISIONAL BIOPSY Left 03/2015   BREAST LUMPECTOMY Right 08/2018   BREAST LUMPECTOMY WITH RADIOACTIVE SEED LOCALIZATION Left 04/07/2015   Procedure: BREAST LUMPECTOMY WITH RADIOACTIVE SEED LOCALIZATION;  Surgeon: Harriette Bouillon, MD;  Location: [facility];  Service: General;  Laterality: Left;   BREAST LUMPECTOMY WITH RADIOACTIVE SEED LOCALIZATION Right 08/13/2018   Procedure: RIGHT BREAST LUMPECTOMY WITH RADIOACTIVE SEED  LOCALIZATION;  Surgeon: Emelia Loron, MD;  Location: [facility];  Service: General;  Laterality: Right;   COLONOSCOPY     CORONARY STENT INTERVENTION N/A 07/15/2016   Procedure: Coronary Stent Intervention;  Surgeon: Tonny Bollman, MD;  Location: [facility] INVASIVE CV LAB;  Service: Cardiovascular;  Laterality: N/A;   ESOPHAGOGASTRODUODENOSCOPY (EGD) WITH PROPOFOL Left 07/25/2016   Procedure: ESOPHAGOGASTRODUODENOSCOPY (EGD) WITH PROPOFOL;  Surgeon: Kerin Salen, MD;  Location: [facility] ENDOSCOPY;  Service: Gastroenterology;  Laterality: Left;   KNEE ARTHROSCOPY Right 12/07/2003   LAPAROSCOPIC SALPINGO OOPHERECTOMY Right 09/08/2003   LEFT HEART CATH N/A 07/15/2016   Procedure: Left Heart Cath;  Surgeon: Tonny Bollman, MD;  Location: [facility] INVASIVE CV LAB;  Service: Cardiovascular;  Laterality: N/A;   LEFT HEART CATH AND CORONARY ANGIOGRAPHY N/A 04/14/2019   Procedure: LEFT HEART CATH AND CORONARY ANGIOGRAPHY;  Surgeon: Kathleene Hazel, MD;  Location: [facility] INVASIVE CV LAB;  Service: Cardiovascular;  Laterality: N/A;   LYSIS OF ADHESION  09/08/2003   NASAL SINUS SURGERY  age 58   RECTAL POLYPECTOMY  10/02/2006   Patient Active Problem List   Diagnosis Date Noted   Atrial tachycardia 08/07/2022   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 08/04/2022   Hyponatremia 08/04/2022   Thrombocytopenia (HCC) 08/04/2022   COPD with acute  exacerbation (HCC) 03/11/2022   Non-STEMI (non-ST elevated myocardial infarction) (HCC) 03/11/2022   RSV (respiratory syncytial virus pneumonia) 03/11/2022   Acute bronchiolitis due to respiratory syncytial virus (RSV) 03/11/2022   Coronary artery disease of native artery of native heart with stable angina pectoris (HCC)    Asthma, chronic, moderate persistent, uncomplicated 02/03/2019   DOE (dyspnea on exertion) 07/31/2018   Osteopenia 06/04/2018   Carcinoma of upper-outer quadrant of right breast in female, estrogen receptor positive  (HCC) 05/30/2018   GI bleed 07/25/2016   Melena 07/24/2016   Acute renal insufficiency 07/24/2016   CAD S/P percutaneous coronary angioplasty 07/16/2016   NSTEMI (non-ST elevated myocardial infarction) (HCC) 07/13/2016   Genetic testing 06/09/2015   Family history of breast cancer in female 05/10/2015   History of melanoma 05/10/2015   Atypical lobular hyperplasia of left breast 04/26/2015   Vision problem 01/08/2014   Hiatus hernia syndrome 08/01/2012   Palpitations 10/18/2010   Acute chest pain 10/18/2010   Hypothyroidism 10/18/2010   Dyslipidemia 06/07/2009   Upper airway cough syndrome 03/29/2009   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   GERD 12/05/2006    PCP: Jonell Cluck MD  REFERRING PROVIDER: Jonell Cluck MD  REFERRING DIAG:  Diagnosis  E44.0 (ICD-10-CM) - Moderate protein-calorie malnutrition  J45.909 (ICD-10-CM) - Unspecified asthma, uncomplicated  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  She is having no low back pain today.  She is having difficulty breathing.  She feels like it might be the humidity. PERTINENT HISTORY: Polymyalgia, arthritis, asthma ( worse int he morning); MI 3 years prior; headaches mostly on rainy days; Hashimoto thyroiditis. Old knee scope but doing well.Marland Kitchen  PAIN:  Are you having pain? Yes: NPRS scale: 6/10 Pain location: pelivs/ bilateral hips  Pain description: aching  Aggravating factors: sit to stand transfers Relieving factors: standing and walking improves the  stiffness  PRECAUTIONS: None  RED FLAGS: None   WEIGHT BEARING RESTRICTIONS: No  FALLS:  Has patient fallen in last 6 months? No collapsed prior to the hospital  LIVING ENVIRONMENT: 3 steps into the house but has a railing   OCCUPATION: Retired Had a walk in Advertising copywriter put in    PLOF: Independent  PATIENT GOALS:  Strength and general mobility    NEXT MD VISIT:  Nothing scheduled   OBJECTIVE:   DIAGNOSTIC FINDINGS:   CXR: IMPRESSION: No active cardiopulmonary disease.   Stable large hiatal hernia.  PATIENT SURVEYS:  Incorrect set up   COGNITION: Overall cognitive status: Within functional limits for tasks assessed     SENSATION: Neuropathy in the right foot   POSTURE: No Significant postural limitations     PALPATION:     (Blank rows = not tested)  UPPER EXTREMITY ROM:  Active ROM Right eval Left eval  Shoulder flexion  80 degrees with pain   Shoulder extension    Shoulder abduction    Shoulder adduction    Shoulder extension    Shoulder internal rotation    Shoulder external rotation    Elbow flexion    Elbow extension    Wrist flexion  Wrist extension    Wrist ulnar deviation    Wrist radial deviation    Wrist pronation    Wrist supination     (Blank rows = not tested)  LOWER EXTREMITY MMT:  MMT Right eval Left eval  Hip flexion 17.4 16.6  Hip extension    Hip abduction 16.2 18.4  Hip adduction    Hip internal rotation    Hip external rotation    Knee flexion    Knee extension Unable to do 2nd to tenderness in her legs Unable to do 2nd to tenderness in her legs   Ankle dorsiflexion    Ankle plantarflexion    Ankle inversion    Ankle eversion     (Blank rows = not tested)  FUNCTIONAL TESTS:  5x sit to stand test: 11 sec mild pain in the knees   Unable to do 6 min walk test today GAIT: Limited hip rotation with gait; fed forward posture; Vitals   Sao2 96-97%  HR 91    TODAY'S TREATMENT:                                                                                                                               DATE:   8/27  UE: Punches 1 pound 3 x 10 Bicep curl 3 x 10 1 pound Bilateral ER 3 x 10 yellow  LE: LAQ 3 x 10 red March 3 x 10 red Hip abduction 3 x 10 red Ball squeeze 3 x 10 red   8/23:  STM L UT UT stretch 30s x3L Scap retraction 3" 2x10 Door way stretch 10s x3L  Piriformis stretch 30s x2ea Discussion of posture with reading and laying in bed Supine SLR 2x10ea Update and review of HEP      8/20:  STM to R gluteal mm, ITB, lateral HS, QL  Manual stretch R HS, piriformis/fig 4  Nerve mobilizaiton in supine with manual HS stretch and ankle pumps x10  Seated LAQ 2.5# 5" hold 2x10 ea Seated QL stretch  Discussion of daily activity modifications and foot wear    8/6: Seated marching x20 Seated LAQ 1.5# 2x10 (increase next time) Seated clam Blue TB 2x10 Seated adductor squeeze 5" 2x10  Standing HR 2x10 Standing march 2x10 Sidestepping at rail no UE (SBA) x2 laps  Sit to stands 2x5 from chair (no UE)  Tandem stance x 20sec ea Romberg stance with EC x30sec (SBA)  Pulleys ea flexion and scaption Ball rolls at plinth x10 ea flexion and scaption Scap retraction x15 Pendulums x10     Access Code: [email or web address] URL: [email or web address] Date: 10/08/2022 Prepared by: Lorayne Bender  Exercises - Seated Hip Abduction with Resistance  - 1 x daily - 7 x weekly - 3 sets - 10-15 reps - Seated March  - 1 x daily - 7 x weekly - 3 sets - 10-15 reps - Seated Knee Extension with Resistance  - 1 x daily - 7  x weekly - 3 sets - 10-15 reps  PATIENT EDUCATION:  Education details: HEP, symptom management.  Person educated: Patient Education method: Explanation, Demonstration, Tactile cues, Verbal cues, and Handouts Education comprehension: verbalized understanding, returned demonstration, verbal cues required, tactile cues required, and needs further  education  HOME EXERCISE PROGRAM: Access Code: [email or web address] URL: [email or web address] Date: 10/08/2022 Prepared by: Lorayne Bender  Exercises - Seated Hip Abduction with Resistance  - 1 x daily - 7 x weekly - 3 sets - 10-15 reps - Seated March  - 1 x daily - 7 x weekly - 3 sets - 10-15 reps - Seated Knee Extension with Resistance  - 1 x daily - 7 x weekly - 3 sets - 10-15 reps  ASSESSMENT:  CLINICAL IMPRESSION: The patient tolerated treatment well. Today. She was short of breath through the whole treatment but still was able to perform exercises. We used a red band for 5 for resistance. We also worked on upper extremity exercises.    OBJECTIVE IMPAIRMENTS: Abnormal gait, decreased endurance, difficulty walking, decreased ROM, decreased strength, impaired UE functional use, and pain.   ACTIVITY LIMITATIONS: carrying, standing, stairs, and locomotion level  PARTICIPATION LIMITATIONS: meal prep, cleaning, laundry, driving, shopping, community activity, and yard work  PERSONAL FACTORS: 3+ comorbidities: COPD, polymyalgia rheumatica, headahces  are also affecting patient's functional outcome.   REHAB POTENTIAL: Good  CLINICAL DECISION MAKING: Evolving/moderate complexity declining overall functional mobility   EVALUATION COMPLEXITY: Moderate   GOALS: Goals reviewed with patient? Yes  SHORT TERM GOALS: Target date: 11/05/2022   Patient will increase gross bilateral LE strength by 5 lbs  Baseline: Goal status: INITIAL  2.  Patient will perform 3 or 6 minute walk test  Baseline:  Goal status: INITIAL  3.  Patient will be independent with basic HEP  Baseline:  Goal status: INITIAL  4.  Patients active left shoulder flexion will be 110 without pain  Baseline:  Goal status: INITIAL   LONG TERM GOALS: Target date: 12/03/2022    Patient will stand for 15 minutes without dyspnea and hip pain in order to perform ADL's  Baseline:  Goal status: INITIAL  2.   Patient will ambulate community distances without pain or dyspnea  Baseline:  Goal status: INITIAL  3.  Patient will have a compete exercise program to promote endurance and functional mobility  Baseline:  Goal status: INITIAL   PLAN:  PT FREQUENCY: 1-2x/week  PT DURATION: 8 weeks  PLANNED INTERVENTIONS: Therapeutic exercises, Therapeutic activity, Neuromuscular re-education, Balance training, Gait training, Patient/Family education, Self Care, Joint mobilization, Aquatic Therapy, Dry Needling, Cryotherapy, Moist heat, Taping, Ionotophoresis 4mg /ml Dexamethasone, Manual therapy, and Re-evaluation  PLAN FOR NEXT SESSION: do 3 or 6 minute walk test if able. The patient would benefit from a general exercise program. She also has left shoulder dysfunction. We can give her a program that would help the left shoulder but not get it more irritated. Consider endurance exercises such as the bike or nu-step. Monitor Sao2.    Dessie Coma, PT 11/07/2022, 7:49 AM

## 2022-11-07 ENCOUNTER — Encounter (HOSPITAL_BASED_OUTPATIENT_CLINIC_OR_DEPARTMENT_OTHER): Payer: Self-pay | Admitting: Physical Therapy

## 2022-11-09 ENCOUNTER — Ambulatory Visit (HOSPITAL_BASED_OUTPATIENT_CLINIC_OR_DEPARTMENT_OTHER): Payer: Medicare Other | Admitting: Physical Therapy

## 2022-11-09 ENCOUNTER — Other Ambulatory Visit: Payer: Self-pay | Admitting: Internal Medicine

## 2022-11-13 ENCOUNTER — Ambulatory Visit (HOSPITAL_BASED_OUTPATIENT_CLINIC_OR_DEPARTMENT_OTHER): Payer: Medicare Other

## 2022-11-16 ENCOUNTER — Ambulatory Visit (HOSPITAL_BASED_OUTPATIENT_CLINIC_OR_DEPARTMENT_OTHER): Payer: Medicare Other | Attending: Family Medicine | Admitting: Physical Therapy

## 2022-11-16 ENCOUNTER — Encounter (HOSPITAL_BASED_OUTPATIENT_CLINIC_OR_DEPARTMENT_OTHER): Payer: Self-pay | Admitting: Physical Therapy

## 2022-11-16 DIAGNOSIS — M6281 Muscle weakness (generalized): Secondary | ICD-10-CM | POA: Insufficient documentation

## 2022-11-16 DIAGNOSIS — R293 Abnormal posture: Secondary | ICD-10-CM | POA: Diagnosis not present

## 2022-11-16 DIAGNOSIS — M25512 Pain in left shoulder: Secondary | ICD-10-CM | POA: Diagnosis not present

## 2022-11-16 DIAGNOSIS — R262 Difficulty in walking, not elsewhere classified: Secondary | ICD-10-CM | POA: Diagnosis not present

## 2022-11-16 NOTE — Therapy (Signed)
OUTPATIENT PHYSICAL THERAPY LOWER EXTREMITY TREATMENT   Patient Name: Sonia [name] MRN: [id number] DOB:[birth year], 82 y.o., female Today's Date: 11/16/2022  END OF SESSION:  PT End of Session - 11/16/22 1120     Visit Number 6    Number of Visits 16    Date for PT Re-Evaluation 12/04/22    PT Start Time 1115   Patient 15 min late   PT Stop Time 1153    PT Time Calculation (min) 38 min    Activity Tolerance Patient tolerated treatment well    Behavior During Therapy [facility] for tasks assessed/performed                Past Medical History:  Diagnosis Date   Arthritis    Asthma    daily and prn inhalers; states good control currently   Breast cancer (HCC)    right   CAD S/P percutaneous coronary angioplasty 07/15/2016   COPD (chronic obstructive pulmonary disease) (HCC)    Dyspnea    occ with exertion   Fibromyalgia    GERD (gastroesophageal reflux disease)    no current med.   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   Vitamin D deficiency    Past Surgical History:  Procedure Laterality Date   ABDOMINAL HYSTERECTOMY     partial   APPENDECTOMY     BREAST BIOPSY Left    BREAST CYST EXCISION Left    BREAST EXCISIONAL BIOPSY Left 03/2015   BREAST LUMPECTOMY Right 08/2018   BREAST LUMPECTOMY WITH RADIOACTIVE SEED LOCALIZATION Left 04/07/2015   Procedure: BREAST LUMPECTOMY WITH RADIOACTIVE SEED LOCALIZATION;  Surgeon: Harriette Bouillon, MD;  Location: [facility];  Service: General;  Laterality: Left;   BREAST LUMPECTOMY WITH RADIOACTIVE SEED LOCALIZATION Right 08/13/2018   Procedure: RIGHT BREAST LUMPECTOMY WITH  RADIOACTIVE SEED LOCALIZATION;  Surgeon: Emelia Loron, MD;  Location:  SURGERY CENTER;  Service: General;  Laterality: Right;   COLONOSCOPY     CORONARY STENT INTERVENTION N/A 07/15/2016   Procedure: Coronary Stent Intervention;  Surgeon: Tonny Bollman, MD;  Location: [facility] INVASIVE CV LAB;  Service: Cardiovascular;  Laterality: N/A;   ESOPHAGOGASTRODUODENOSCOPY (EGD) WITH PROPOFOL Left 07/25/2016   Procedure: ESOPHAGOGASTRODUODENOSCOPY (EGD) WITH PROPOFOL;  Surgeon: Kerin Salen, MD;  Location: [facility] ENDOSCOPY;  Service: Gastroenterology;  Laterality: Left;   KNEE ARTHROSCOPY Right 12/07/2003   LAPAROSCOPIC SALPINGO OOPHERECTOMY Right 09/08/2003   LEFT HEART CATH N/A 07/15/2016   Procedure: Left Heart Cath;  Surgeon: Tonny Bollman, MD;  Location: [facility] INVASIVE CV LAB;  Service: Cardiovascular;  Laterality: N/A;   LEFT HEART CATH AND CORONARY ANGIOGRAPHY N/A 04/14/2019   Procedure: LEFT HEART CATH AND CORONARY ANGIOGRAPHY;  Surgeon: Kathleene Hazel, MD;  Location: [facility] INVASIVE CV LAB;  Service: Cardiovascular;  Laterality: N/A;   LYSIS OF ADHESION  09/08/2003   NASAL SINUS SURGERY  age 89   RECTAL POLYPECTOMY  10/02/2006   Patient Active Problem List   Diagnosis Date Noted   Atrial tachycardia 08/07/2022   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 08/04/2022   Hyponatremia 08/04/2022   Thrombocytopenia (HCC) 08/04/2022  COPD with acute exacerbation (HCC) 03/11/2022   Non-STEMI (non-ST elevated myocardial infarction) (HCC) 03/11/2022   RSV (respiratory syncytial virus pneumonia) 03/11/2022   Acute bronchiolitis due to respiratory syncytial virus (RSV) 03/11/2022   Coronary artery disease of native artery of native heart with stable angina pectoris (HCC)    Asthma, chronic, moderate persistent, uncomplicated 02/03/2019   DOE (dyspnea on exertion) 07/31/2018   Osteopenia 06/04/2018   Carcinoma of upper-outer quadrant of right breast in female, estrogen  receptor positive (HCC) 05/30/2018   GI bleed 07/25/2016   Melena 07/24/2016   Acute renal insufficiency 07/24/2016   CAD S/P percutaneous coronary angioplasty 07/16/2016   NSTEMI (non-ST elevated myocardial infarction) (HCC) 07/13/2016   Genetic testing 06/09/2015   Family history of breast cancer in female 05/10/2015   History of melanoma 05/10/2015   Atypical lobular hyperplasia of left breast 04/26/2015   Vision problem 01/08/2014   Hiatus hernia syndrome 08/01/2012   Palpitations 10/18/2010   Acute chest pain 10/18/2010   Hypothyroidism 10/18/2010   Dyslipidemia 06/07/2009   Upper airway cough syndrome 03/29/2009   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   GERD 12/05/2006    PCP: Jonell Cluck MD  REFERRING PROVIDER: Jonell Cluck MD  REFERRING DIAG:  Diagnosis  E44.0 (ICD-10-CM) - Moderate protein-calorie malnutrition  J45.909 (ICD-10-CM) - Unspecified asthma, uncomplicated  M35.3 (ICD-10-CM) - Polymyalgia rheumatica  R26.89 (ICD-10-CM) - Other abnormalities of gait and mobility   THERAPY DIAG:  Difficulty in walking, not elsewhere classified  Left shoulder pain, unspecified chronicity  Abnormal posture  Muscle weakness (generalized)  Rationale for Evaluation and Treatment: Rehabilitation  ONSET DATE:  Decreased mobility and endurance since hospitalization but declining even before that   SUBJECTIVE:   SUBJECTIVE STATEMENT: The patient has been breathing better. She had a gall bladder issue last week.     PERTINENT HISTORY: Polymyalgia, arthritis, asthma ( worse int he morning); MI 3 years prior; headaches mostly on rainy days; Hashimoto thyroiditis. Old knee scope but doing well.Marland Kitchen  PAIN:  Are you having pain? Yes: NPRS scale: 6/10 Pain location: pelivs/ bilateral hips  Pain description: aching  Aggravating factors: sit to stand transfers Relieving factors: standing and walking improves the stiffness  PRECAUTIONS: None  RED FLAGS: None   WEIGHT  BEARING RESTRICTIONS: No  FALLS:  Has patient fallen in last 6 months? No collapsed prior to the hospital  LIVING ENVIRONMENT: 3 steps into the house but has a railing   OCCUPATION: Retired Had a walk in Advertising copywriter put in    PLOF: Independent  PATIENT GOALS:  Strength and general mobility    NEXT MD VISIT:  Nothing scheduled   OBJECTIVE:   DIAGNOSTIC FINDINGS:   CXR: IMPRESSION: No active cardiopulmonary disease.   Stable large hiatal hernia.  PATIENT SURVEYS:  Incorrect set up   COGNITION: Overall cognitive status: Within functional limits for tasks assessed     SENSATION: Neuropathy in the right foot   POSTURE: No Significant postural limitations     PALPATION:     (Blank rows = not tested)  UPPER EXTREMITY ROM:  Active ROM Right eval Left eval  Shoulder flexion  80 degrees with pain   Shoulder extension    Shoulder abduction    Shoulder adduction    Shoulder extension    Shoulder internal rotation    Shoulder external rotation    Elbow flexion    Elbow extension    Wrist flexion    Wrist extension    Wrist ulnar deviation  Wrist radial deviation    Wrist pronation    Wrist supination     (Blank rows = not tested)  LOWER EXTREMITY MMT:  MMT Right eval Left eval  Hip flexion 17.4 16.6  Hip extension    Hip abduction 16.2 18.4  Hip adduction    Hip internal rotation    Hip external rotation    Knee flexion    Knee extension Unable to do 2nd to tenderness in her legs Unable to do 2nd to tenderness in her legs   Ankle dorsiflexion    Ankle plantarflexion    Ankle inversion    Ankle eversion     (Blank rows = not tested)  FUNCTIONAL TESTS:  5x sit to stand test: 11 sec mild pain in the knees   Unable to do 6 min walk test today GAIT: Limited hip rotation with gait; fed forward posture; Vitals   Sao2 96-97%  HR 91    TODAY'S TREATMENT:                                                                                                                               DATE:   9/6 Punches 3 pound 3 x 10 Bicep curl 3 x 10 3 pound  Hip abduction 3 x 10 green    Standing hurdle step over x15 fwd and side to side   Step onto air-ex x20 each leg         8/27  UE: Punches 1 pound 3 x 10 Bicep curl 3 x 10 1 pound Bilateral ER 3 x 10 yellow  LE: LAQ 3 x 10 red March 3 x 10 red Hip abduction 3 x 10 red Ball squeeze 3 x 10 red   8/23:  STM L UT UT stretch 30s x3L Scap retraction 3" 2x10 Door way stretch 10s x3L  Piriformis stretch 30s x2ea Discussion of posture with reading and laying in bed Supine SLR 2x10ea Update and review of HEP      8/20:  STM to R gluteal mm, ITB, lateral HS, QL  Manual stretch R HS, piriformis/fig 4  Nerve mobilizaiton in supine with manual HS stretch and ankle pumps x10  Seated LAQ 2.5# 5" hold 2x10 ea Seated QL stretch  Discussion of daily activity modifications and foot wear    8/6: Seated marching x20 Seated LAQ 1.5# 2x10 (increase next time) Seated clam Blue TB 2x10 Seated adductor squeeze 5" 2x10  Standing HR 2x10 Standing march 2x10 Sidestepping at rail no UE (SBA) x2 laps  Sit to stands 2x5 from chair (no UE)  Tandem stance x 20sec ea Romberg stance with EC x30sec (SBA)  Pulleys ea flexion and scaption Ball rolls at plinth x10 ea flexion and scaption Scap retraction x15 Pendulums x10     Access Code: [email or web address] URL: [email or web address] Date: 10/08/2022 Prepared by: Lorayne Bender  Exercises - Seated Hip Abduction with Resistance  - 1 x daily - 7  x weekly - 3 sets - 10-15 reps - Seated March  - 1 x daily - 7 x weekly - 3 sets - 10-15 reps - Seated Knee Extension with Resistance  - 1 x daily - 7 x weekly - 3 sets - 10-15 reps  PATIENT EDUCATION:  Education details: HEP, symptom management.  Person educated: Patient Education method: Explanation, Demonstration, Tactile cues, Verbal cues, and Handouts Education  comprehension: verbalized understanding, returned demonstration, verbal cues required, tactile cues required, and needs further education  HOME EXERCISE PROGRAM: Access Code: [email or web address] URL: [email or web address] Date: 10/08/2022 Prepared by: Lorayne Bender  Exercises - Seated Hip Abduction with Resistance  - 1 x daily - 7 x weekly - 3 sets - 10-15 reps - Seated March  - 1 x daily - 7 x weekly - 3 sets - 10-15 reps - Seated Knee Extension with Resistance  - 1 x daily - 7 x weekly - 3 sets - 10-15 reps  ASSESSMENT:  CLINICAL IMPRESSION: The patient tolerated treatment well. Today. She was short of breath through the whole treatment but still was able to perform exercises. We used a red band for 5 for resistance. We also worked on upper extremity exercises.    OBJECTIVE IMPAIRMENTS: Abnormal gait, decreased endurance, difficulty walking, decreased ROM, decreased strength, impaired UE functional use, and pain.   ACTIVITY LIMITATIONS: carrying, standing, stairs, and locomotion level  PARTICIPATION LIMITATIONS: meal prep, cleaning, laundry, driving, shopping, community activity, and yard work  PERSONAL FACTORS: 3+ comorbidities: COPD, polymyalgia rheumatica, headahces  are also affecting patient's functional outcome.   REHAB POTENTIAL: Good  CLINICAL DECISION MAKING: Evolving/moderate complexity declining overall functional mobility   EVALUATION COMPLEXITY: Moderate   GOALS: Goals reviewed with patient? Yes  SHORT TERM GOALS: Target date: 11/05/2022   Patient will increase gross bilateral LE strength by 5 lbs  Baseline: Goal status: INITIAL  2.  Patient will perform 3 or 6 minute walk test  Baseline:  Goal status: INITIAL  3.  Patient will be independent with basic HEP  Baseline:  Goal status: INITIAL  4.  Patients active left shoulder flexion will be 110 without pain  Baseline:  Goal status: INITIAL   LONG TERM GOALS: Target date:  12/03/2022    Patient will stand for 15 minutes without dyspnea and hip pain in order to perform ADL's  Baseline:  Goal status: INITIAL  2.  Patient will ambulate community distances without pain or dyspnea  Baseline:  Goal status: INITIAL  3.  Patient will have a compete exercise program to promote endurance and functional mobility  Baseline:  Goal status: INITIAL   PLAN:  PT FREQUENCY: 1-2x/week  PT DURATION: 8 weeks  PLANNED INTERVENTIONS: Therapeutic exercises, Therapeutic activity, Neuromuscular re-education, Balance training, Gait training, Patient/Family education, Self Care, Joint mobilization, Aquatic Therapy, Dry Needling, Cryotherapy, Moist heat, Taping, Ionotophoresis 4mg /ml Dexamethasone, Manual therapy, and Re-evaluation  PLAN FOR NEXT SESSION: do 3 or 6 minute walk test if able. The patient would benefit from a general exercise program. She also has left shoulder dysfunction. We can give her a program that would help the left shoulder but not get it more irritated. Consider endurance exercises such as the bike or nu-step. Monitor Sao2.    Dessie Coma, PT 11/16/2022, 11:31 AM

## 2022-11-20 ENCOUNTER — Ambulatory Visit (HOSPITAL_BASED_OUTPATIENT_CLINIC_OR_DEPARTMENT_OTHER): Payer: Medicare Other | Admitting: Physical Therapy

## 2022-11-20 DIAGNOSIS — R293 Abnormal posture: Secondary | ICD-10-CM

## 2022-11-20 DIAGNOSIS — R262 Difficulty in walking, not elsewhere classified: Secondary | ICD-10-CM

## 2022-11-20 DIAGNOSIS — M6281 Muscle weakness (generalized): Secondary | ICD-10-CM | POA: Diagnosis not present

## 2022-11-20 DIAGNOSIS — M25512 Pain in left shoulder: Secondary | ICD-10-CM

## 2022-11-20 NOTE — Therapy (Signed)
OUTPATIENT PHYSICAL THERAPY LOWER EXTREMITY TREATMENT   Patient Name: Sonia [name] MRN: [id number] DOB:1940/09/02, 82 y.o., female Today's Date: 11/20/2022  END OF SESSION:  PT End of Session - 11/20/22 1110     Visit Number 7    Number of Visits 16    Date for PT Re-Evaluation 12/04/22    PT Start Time 1107   Patient 7 min late   PT Stop Time 1145    PT Time Calculation (min) 38 min    Activity Tolerance Patient tolerated treatment well    Behavior During Therapy [facility] for tasks assessed/performed                Past Medical History:  Diagnosis Date   Arthritis    Asthma    daily and prn inhalers; states good control currently   Breast cancer (HCC)    right   CAD S/P percutaneous coronary angioplasty 07/15/2016   COPD (chronic obstructive pulmonary disease) (HCC)    Dyspnea    occ with exertion   Fibromyalgia    GERD (gastroesophageal reflux disease)    no current med.   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   Vitamin D deficiency    Past Surgical History:  Procedure Laterality Date   ABDOMINAL HYSTERECTOMY     partial   APPENDECTOMY     BREAST BIOPSY Left    BREAST CYST EXCISION Left    BREAST EXCISIONAL BIOPSY Left 03/2015   BREAST LUMPECTOMY Right 08/2018   BREAST LUMPECTOMY WITH RADIOACTIVE SEED LOCALIZATION Left 04/07/2015   Procedure: BREAST LUMPECTOMY WITH RADIOACTIVE SEED LOCALIZATION;  Surgeon: Harriette Bouillon, MD;  Location: [facility];  Service: General;  Laterality: Left;   BREAST LUMPECTOMY WITH RADIOACTIVE SEED LOCALIZATION Right 08/13/2018   Procedure: RIGHT BREAST LUMPECTOMY WITH  RADIOACTIVE SEED LOCALIZATION;  Surgeon: Emelia Loron, MD;  Location: [facility];  Service: General;  Laterality: Right;   COLONOSCOPY     CORONARY STENT INTERVENTION N/A 07/15/2016   Procedure: Coronary Stent Intervention;  Surgeon: Tonny Bollman, MD;  Location: [facility] INVASIVE CV LAB;  Service: Cardiovascular;  Laterality: N/A;   ESOPHAGOGASTRODUODENOSCOPY (EGD) WITH PROPOFOL Left 07/25/2016   Procedure: ESOPHAGOGASTRODUODENOSCOPY (EGD) WITH PROPOFOL;  Surgeon: Kerin Salen, MD;  Location: [facility] ENDOSCOPY;  Service: Gastroenterology;  Laterality: Left;   KNEE ARTHROSCOPY Right 12/07/2003   LAPAROSCOPIC SALPINGO OOPHERECTOMY Right 09/08/2003   LEFT HEART CATH N/A 07/15/2016   Procedure: Left Heart Cath;  Surgeon: Tonny Bollman, MD;  Location: [facility] INVASIVE CV LAB;  Service: Cardiovascular;  Laterality: N/A;   LEFT HEART CATH AND CORONARY ANGIOGRAPHY N/A 04/14/2019   Procedure: LEFT HEART CATH AND CORONARY ANGIOGRAPHY;  Surgeon: Kathleene Hazel, MD;  Location: [facility] INVASIVE CV LAB;  Service: Cardiovascular;  Laterality: N/A;   LYSIS OF ADHESION  09/08/2003   NASAL SINUS SURGERY  age 2   RECTAL POLYPECTOMY  10/02/2006   Patient Active Problem List   Diagnosis Date Noted   Atrial tachycardia 08/07/2022   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 08/04/2022   Hyponatremia 08/04/2022   Thrombocytopenia (HCC) 08/04/2022  COPD with acute exacerbation (HCC) 03/11/2022   Non-STEMI (non-ST elevated myocardial infarction) (HCC) 03/11/2022   RSV (respiratory syncytial virus pneumonia) 03/11/2022   Acute bronchiolitis due to respiratory syncytial virus (RSV) 03/11/2022   Coronary artery disease of native artery of native heart with stable angina pectoris (HCC)    Asthma, chronic, moderate persistent, uncomplicated 02/03/2019   DOE (dyspnea on exertion) 07/31/2018   Osteopenia 06/04/2018   Carcinoma of upper-outer quadrant of right breast in female, estrogen  receptor positive (HCC) 05/30/2018   GI bleed 07/25/2016   Melena 07/24/2016   Acute renal insufficiency 07/24/2016   CAD S/P percutaneous coronary angioplasty 07/16/2016   NSTEMI (non-ST elevated myocardial infarction) (HCC) 07/13/2016   Genetic testing 06/09/2015   Family history of breast cancer in female 05/10/2015   History of melanoma 05/10/2015   Atypical lobular hyperplasia of left breast 04/26/2015   Vision problem 01/08/2014   Hiatus hernia syndrome 08/01/2012   Palpitations 10/18/2010   Acute chest pain 10/18/2010   Hypothyroidism 10/18/2010   Dyslipidemia 06/07/2009   Upper airway cough syndrome 03/29/2009   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   GERD 12/05/2006    PCP: Jonell Cluck MD  REFERRING PROVIDER: Jonell Cluck MD  REFERRING DIAG:  Diagnosis  E44.0 (ICD-10-CM) - Moderate protein-calorie malnutrition  J45.909 (ICD-10-CM) - Unspecified asthma, uncomplicated  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 She took an allergy pill.     PERTINENT HISTORY: Polymyalgia, arthritis, asthma ( worse int he morning); MI 3 years prior; headaches mostly on rainy days; Hashimoto thyroiditis. Old knee scope but doing well.Marland Kitchen  PAIN:  Are you having pain? Yes: NPRS scale: 4/10 Pain location: bilateral knees  Pain description: aching  Aggravating factors: sit to stand transfers Relieving factors: standing and walking improves the stiffness  PRECAUTIONS: None  RED FLAGS: None   WEIGHT BEARING  RESTRICTIONS: No  FALLS:  Has patient fallen in last 6 months? No collapsed prior to the hospital  LIVING ENVIRONMENT: 3 steps into the house but has a railing   OCCUPATION: Retired Had a walk in Advertising copywriter put in    PLOF: Independent  PATIENT GOALS:  Strength and general mobility    NEXT MD VISIT:  Nothing scheduled   OBJECTIVE:   DIAGNOSTIC FINDINGS:   CXR: IMPRESSION: No active cardiopulmonary disease.   Stable large hiatal hernia.  PATIENT SURVEYS:  Incorrect set up   COGNITION: Overall cognitive status: Within functional limits for tasks assessed     SENSATION: Neuropathy in the right foot   POSTURE: No Significant postural limitations     PALPATION:     (Blank rows = not tested)  UPPER EXTREMITY ROM:  Active ROM Right eval Left eval  Shoulder flexion  80 degrees with pain   Shoulder extension    Shoulder abduction    Shoulder adduction    Shoulder extension    Shoulder internal rotation    Shoulder external rotation    Elbow flexion    Elbow extension    Wrist flexion    Wrist extension    Wrist ulnar deviation  Wrist radial deviation    Wrist pronation    Wrist supination     (Blank rows = not tested)  LOWER EXTREMITY MMT:  MMT Right eval Left eval  Hip flexion 17.4 16.6  Hip extension    Hip abduction 16.2 18.4  Hip adduction    Hip internal rotation    Hip external rotation    Knee flexion    Knee extension Unable to do 2nd to tenderness in her legs Unable to do 2nd to tenderness in her legs   Ankle dorsiflexion    Ankle plantarflexion    Ankle inversion    Ankle eversion     (Blank rows = not tested)  FUNCTIONAL TESTS:  5x sit to stand test: 11 sec mild pain in the knees   Unable to do 6 min walk test today GAIT: Limited hip rotation with gait; fed forward posture; Vitals   Sao2 96-97%  HR 91    TODAY'S TREATMENT:                                                                                                                               DATE:   9/10 Punches 3 pound 3 x 10 Bicep curl 3 x 10 3 pound Shoulder press 3x10 2 lbs mild pain in the right shoulder on second set '  Nu-step 3 2 min intervals while monitoring dyspnea   9/6 Punches 3 pound 3 x 10 Bicep curl 3 x 10 3 pound  Hip abduction 3 x 10 green    Standing hurdle step over x15 fwd and side to side   Step onto air-ex x20 each leg         8/27  UE: Punches 1 pound 3 x 10 Bicep curl 3 x 10 1 pound Bilateral ER 3 x 10 yellow  LE: LAQ 3 x 10 red March 3 x 10 red Hip abduction 3 x 10 red Ball squeeze 3 x 10 red   8/23:  STM L UT UT stretch 30s x3L Scap retraction 3" 2x10 Door way stretch 10s x3L  Piriformis stretch 30s x2ea Discussion of posture with reading and laying in bed Supine SLR 2x10ea Update and review of HEP      8/20:  STM to R gluteal mm, ITB, lateral HS, QL  Manual stretch R HS, piriformis/fig 4  Nerve mobilizaiton in supine with manual HS stretch and ankle pumps x10  Seated LAQ 2.5# 5" hold 2x10 ea Seated QL stretch  Discussion of daily activity modifications and foot wear    8/6: Seated marching x20 Seated LAQ 1.5# 2x10 (increase next time) Seated clam Blue TB 2x10 Seated adductor squeeze 5" 2x10  Standing HR 2x10 Standing march 2x10 Sidestepping at rail no UE (SBA) x2 laps  Sit to stands 2x5 from chair (no UE)  Tandem stance x 20sec ea Romberg stance with EC x30sec (SBA)  Pulleys ea flexion and scaption Ball rolls at [employer]  x10 ea flexion and scaption Scap retraction x15 Pendulums x10     Access Code: [email or web address] URL: [email or web address] Date: 10/08/2022 Prepared by: Lorayne Bender  Exercises - Seated Hip Abduction with Resistance  - 1 x daily - 7 x weekly - 3 sets - 10-15 reps - Seated March  - 1 x daily - 7 x weekly - 3 sets - 10-15 reps - Seated Knee Extension with Resistance  - 1 x daily - 7 x weekly - 3 sets - 10-15 reps  PATIENT  EDUCATION:  Education details: HEP, symptom management.  Person educated: Patient Education method: Explanation, Demonstration, Tactile cues, Verbal cues, and Handouts Education comprehension: verbalized understanding, returned demonstration, verbal cues required, tactile cues required, and needs further education  HOME EXERCISE PROGRAM: Access Code: [email or web address] URL: [email or web address] Date: 10/08/2022 Prepared by: Lorayne Bender  Exercises - Seated Hip Abduction with Resistance  - 1 x daily - 7 x weekly - 3 sets - 10-15 reps - Seated March  - 1 x daily - 7 x weekly - 3 sets - 10-15 reps - Seated Knee Extension with Resistance  - 1 x daily - 7 x weekly - 3 sets - 10-15 reps  ASSESSMENT:  CLINICAL IMPRESSION: Despite baseline dyspnea, the patient did well. We reviewed a standing series for home. She could feel it a little but overall she did well. We lso worked on UE exercises with weights. She has been doing them at home. We will do a re-assessment next week. She feels like overall her endurance is improving.  OBJECTIVE IMPAIRMENTS: Abnormal gait, decreased endurance, difficulty walking, decreased ROM, decreased strength, impaired UE functional use, and pain.   ACTIVITY LIMITATIONS: carrying, standing, stairs, and locomotion level  PARTICIPATION LIMITATIONS: meal prep, cleaning, laundry, driving, shopping, community activity, and yard work  PERSONAL FACTORS: 3+ comorbidities: COPD, polymyalgia rheumatica, headahces  are also affecting patient's functional outcome.   REHAB POTENTIAL: Good  CLINICAL DECISION MAKING: Evolving/moderate complexity declining overall functional mobility   EVALUATION COMPLEXITY: Moderate   GOALS: Goals reviewed with patient? Yes  SHORT TERM GOALS: Target date: 11/05/2022   Patient will increase gross bilateral LE strength by 5 lbs  Baseline: Goal status: INITIAL  2.  Patient will perform 3 or 6 minute walk test  Baseline:  Goal  status: INITIAL  3.  Patient will be independent with basic HEP  Baseline:  Goal status: INITIAL  4.  Patients active left shoulder flexion will be 110 without pain  Baseline:  Goal status: INITIAL   LONG TERM GOALS: Target date: 12/03/2022    Patient will stand for 15 minutes without dyspnea and hip pain in order to perform ADL's  Baseline:  Goal status: INITIAL  2.  Patient will ambulate community distances without pain or dyspnea  Baseline:  Goal status: INITIAL  3.  Patient will have a compete exercise program to promote endurance and functional mobility  Baseline:  Goal status: INITIAL   PLAN:  PT FREQUENCY: 1-2x/week  PT DURATION: 8 weeks  PLANNED INTERVENTIONS: Therapeutic exercises, Therapeutic activity, Neuromuscular re-education, Balance training, Gait training, Patient/Family education, Self Care, Joint mobilization, Aquatic Therapy, Dry Needling, Cryotherapy, Moist heat, Taping, Ionotophoresis 4mg /ml Dexamethasone, Manual therapy, and Re-evaluation  PLAN FOR NEXT SESSION: do 3 or 6 minute walk test if able. The patient would benefit from a general exercise program. She also has left shoulder dysfunction. We can give her a program that would help the left shoulder but not get it more irritated. Consider endurance exercises such as  the bike or nu-step. Monitor Sao2.    Dessie Coma, PT 11/20/2022, 11:55 AM

## 2022-11-23 ENCOUNTER — Ambulatory Visit (HOSPITAL_BASED_OUTPATIENT_CLINIC_OR_DEPARTMENT_OTHER): Payer: Medicare Other | Admitting: Physical Therapy

## 2022-11-23 DIAGNOSIS — M6281 Muscle weakness (generalized): Secondary | ICD-10-CM | POA: Diagnosis not present

## 2022-11-23 DIAGNOSIS — M25512 Pain in left shoulder: Secondary | ICD-10-CM | POA: Diagnosis not present

## 2022-11-23 DIAGNOSIS — R293 Abnormal posture: Secondary | ICD-10-CM

## 2022-11-23 DIAGNOSIS — R262 Difficulty in walking, not elsewhere classified: Secondary | ICD-10-CM

## 2022-11-25 ENCOUNTER — Encounter (HOSPITAL_BASED_OUTPATIENT_CLINIC_OR_DEPARTMENT_OTHER): Payer: Self-pay | Admitting: Physical Therapy

## 2022-11-25 NOTE — Therapy (Signed)
OUTPATIENT PHYSICAL THERAPY LOWER EXTREMITY TREATMENT   Patient Name: Sonia [name] MRN: [id number] DOB:1940/10/22, 82 y.o., female Today's Date: 11/25/2022  END OF SESSION:  PT End of Session - 11/25/22 0906     Visit Number 8    Number of Visits 16    Date for PT Re-Evaluation 12/04/22    PT Start Time 1100    PT Stop Time 1143    PT Time Calculation (min) 43 min    Activity Tolerance Patient tolerated treatment well    Behavior During Therapy [facility] for tasks assessed/performed                Past Medical History:  Diagnosis Date   Arthritis    Asthma    daily and prn inhalers; states good control currently   Breast cancer (HCC)    right   CAD S/P percutaneous coronary angioplasty 07/15/2016   COPD (chronic obstructive pulmonary disease) (HCC)    Dyspnea    occ with exertion   Fibromyalgia    GERD (gastroesophageal reflux disease)    no current med.   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   Vitamin D deficiency    Past Surgical History:  Procedure Laterality Date   ABDOMINAL HYSTERECTOMY     partial   APPENDECTOMY     BREAST BIOPSY Left    BREAST CYST EXCISION Left    BREAST EXCISIONAL BIOPSY Left 03/2015   BREAST LUMPECTOMY Right 08/2018   BREAST LUMPECTOMY WITH RADIOACTIVE SEED LOCALIZATION Left 04/07/2015   Procedure: BREAST LUMPECTOMY WITH RADIOACTIVE SEED LOCALIZATION;  Surgeon: Harriette Bouillon, MD;  Location: [facility];  Service: General;  Laterality: Left;   BREAST LUMPECTOMY WITH RADIOACTIVE SEED LOCALIZATION Right 08/13/2018   Procedure: RIGHT BREAST LUMPECTOMY WITH RADIOACTIVE SEED  LOCALIZATION;  Surgeon: Emelia Loron, MD;  Location: [facility];  Service: General;  Laterality: Right;   COLONOSCOPY     CORONARY STENT INTERVENTION N/A 07/15/2016   Procedure: Coronary Stent Intervention;  Surgeon: Tonny Bollman, MD;  Location: [facility] INVASIVE CV LAB;  Service: Cardiovascular;  Laterality: N/A;   ESOPHAGOGASTRODUODENOSCOPY (EGD) WITH PROPOFOL Left 07/25/2016   Procedure: ESOPHAGOGASTRODUODENOSCOPY (EGD) WITH PROPOFOL;  Surgeon: Kerin Salen, MD;  Location: [facility] ENDOSCOPY;  Service: Gastroenterology;  Laterality: Left;   KNEE ARTHROSCOPY Right 12/07/2003   LAPAROSCOPIC SALPINGO OOPHERECTOMY Right 09/08/2003   LEFT HEART CATH N/A 07/15/2016   Procedure: Left Heart Cath;  Surgeon: Tonny Bollman, MD;  Location: [facility] INVASIVE CV LAB;  Service: Cardiovascular;  Laterality: N/A;   LEFT HEART CATH AND CORONARY ANGIOGRAPHY N/A 04/14/2019   Procedure: LEFT HEART CATH AND CORONARY ANGIOGRAPHY;  Surgeon: Kathleene Hazel, MD;  Location: [facility] INVASIVE CV LAB;  Service: Cardiovascular;  Laterality: N/A;   LYSIS OF ADHESION  09/08/2003   NASAL SINUS SURGERY  age 78   RECTAL POLYPECTOMY  10/02/2006   Patient Active Problem List   Diagnosis Date Noted   Atrial tachycardia 08/07/2022   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 08/04/2022   Hyponatremia 08/04/2022   Thrombocytopenia (HCC) 08/04/2022   COPD with acute  exacerbation (HCC) 03/11/2022   Non-STEMI (non-ST elevated myocardial infarction) (HCC) 03/11/2022   RSV (respiratory syncytial virus pneumonia) 03/11/2022   Acute bronchiolitis due to respiratory syncytial virus (RSV) 03/11/2022   Coronary artery disease of native artery of native heart with stable angina pectoris (HCC)    Asthma, chronic, moderate persistent, uncomplicated 02/03/2019   DOE (dyspnea on exertion) 07/31/2018   Osteopenia 06/04/2018   Carcinoma of upper-outer quadrant of right breast in female, estrogen receptor positive  (HCC) 05/30/2018   GI bleed 07/25/2016   Melena 07/24/2016   Acute renal insufficiency 07/24/2016   CAD S/P percutaneous coronary angioplasty 07/16/2016   NSTEMI (non-ST elevated myocardial infarction) (HCC) 07/13/2016   Genetic testing 06/09/2015   Family history of breast cancer in female 05/10/2015   History of melanoma 05/10/2015   Atypical lobular hyperplasia of left breast 04/26/2015   Vision problem 01/08/2014   Hiatus hernia syndrome 08/01/2012   Palpitations 10/18/2010   Acute chest pain 10/18/2010   Hypothyroidism 10/18/2010   Dyslipidemia 06/07/2009   Upper airway cough syndrome 03/29/2009   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   GERD 12/05/2006    PCP: Jonell Cluck MD  REFERRING PROVIDER: Jonell Cluck MD  REFERRING DIAG:  Diagnosis  E44.0 (ICD-10-CM) - Moderate protein-calorie malnutrition  J45.909 (ICD-10-CM) - Unspecified asthma, uncomplicated  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  She feels like it might be the weather.  She mostly feels it in her knees.    PERTINENT HISTORY: Polymyalgia, arthritis, asthma ( worse int he morning); MI 3 years prior; headaches mostly on rainy days; Hashimoto thyroiditis. Old knee scope but doing well.Marland Kitchen  PAIN:  Are you having pain? Yes: NPRS scale: 4/10 Pain location: bilateral knees  Pain description: aching  Aggravating factors: sit to stand transfers Relieving factors: standing and walking improves the stiffness  PRECAUTIONS: None  RED FLAGS: None   WEIGHT BEARING  RESTRICTIONS: No  FALLS:  Has patient fallen in last 6 months? No collapsed prior to the hospital  LIVING ENVIRONMENT: 3 steps into the house but has a railing   OCCUPATION: Retired Had a walk in Advertising copywriter put in    PLOF: Independent  PATIENT GOALS:  Strength and general mobility    NEXT MD VISIT:  Nothing scheduled   OBJECTIVE:   DIAGNOSTIC FINDINGS:   CXR: IMPRESSION: No active cardiopulmonary disease.   Stable large hiatal hernia.  PATIENT SURVEYS:  Incorrect set up   COGNITION: Overall cognitive status: Within functional limits for tasks assessed     SENSATION: Neuropathy in the right foot   POSTURE: No Significant postural limitations     PALPATION:     (Blank rows = not tested)  UPPER EXTREMITY ROM:  Active ROM Right eval Left eval  Shoulder flexion  80 degrees with pain   Shoulder extension    Shoulder abduction    Shoulder adduction    Shoulder extension    Shoulder internal rotation    Shoulder external rotation    Elbow flexion    Elbow extension    Wrist flexion    Wrist extension    Wrist  ulnar deviation    Wrist radial deviation    Wrist pronation    Wrist supination     (Blank rows = not tested)  LOWER EXTREMITY MMT:  MMT Right eval Left eval  Hip flexion 17.4 16.6  Hip extension    Hip abduction 16.2 18.4  Hip adduction    Hip internal rotation    Hip external rotation    Knee flexion    Knee extension Unable to do 2nd to tenderness in her legs Unable to do 2nd to tenderness in her legs   Ankle dorsiflexion    Ankle plantarflexion    Ankle inversion    Ankle eversion     (Blank rows = not tested)  FUNCTIONAL TESTS:  5x sit to stand test: 11 sec mild pain in the knees   Unable to do 6 min walk test today GAIT: Limited hip rotation with gait; fed forward posture; Vitals   Sao2 96-97%  HR 91    TODAY'S TREATMENT:                                                                                                                               DATE:   9/15 NuStep 5 minutes level 4  Supine: LTR x 20 Supine march 2 x 15 each leg Supine hip abduction red band 2 x 15 Supine bridge 2 x 10 SAQ 2 x 10  Seated heel raise 2 x 10    9/10 Punches 3 pound 3 x 10 Bicep curl 3 x 10 3 pound Shoulder press 3x10 2 lbs mild pain in the right shoulder on second set '  Nu-step 3 2 min intervals while monitoring dyspnea   9/6 Punches 3 pound 3 x 10 Bicep curl 3 x 10 3 pound  Hip abduction 3 x 10 green    Standing hurdle step over x15 fwd and side to side   Step onto air-ex x20 each leg         8/27  UE: Punches 1 pound 3 x 10 Bicep curl 3 x 10 1 pound Bilateral ER 3 x 10 yellow  LE: LAQ 3 x 10 red March 3 x 10 red Hip abduction 3 x 10 red Ball squeeze 3 x 10 red   8/23:  STM L UT UT stretch 30s x3L Scap retraction 3" 2x10 Door way stretch 10s x3L  Piriformis stretch 30s x2ea Discussion of posture with reading and laying in bed Supine SLR 2x10ea Update and review of HEP      8/20:  STM to R gluteal mm, ITB, lateral HS, QL  Manual stretch R HS, piriformis/fig 4  Nerve mobilizaiton in supine with manual HS stretch and ankle pumps x10  Seated LAQ 2.5# 5" hold 2x10 ea Seated QL stretch  Discussion of daily activity modifications and foot wear    8/6: Seated marching x20 Seated LAQ 1.5# 2x10 (increase next time) Seated clam Blue TB 2x10 Seated adductor  squeeze 5" 2x10  Standing HR 2x10 Standing march 2x10 Sidestepping at rail no UE (SBA) x2 laps  Sit to stands 2x5 from chair (no UE)  Tandem stance x 20sec ea Romberg stance with EC x30sec (SBA)  Pulleys ea flexion and scaption Ball rolls at plinth x10 ea flexion and scaption Scap retraction x15 Pendulums x10     Access Code: [email or web address] URL: [email or web address] Date: 10/08/2022 Prepared by: Lorayne Bender  Exercises - Seated Hip Abduction with Resistance  - 1 x daily - 7 x  weekly - 3 sets - 10-15 reps - Seated March  - 1 x daily - 7 x weekly - 3 sets - 10-15 reps - Seated Knee Extension with Resistance  - 1 x daily - 7 x weekly - 3 sets - 10-15 reps  PATIENT EDUCATION:  Education details: HEP, symptom management.  Person educated: Patient Education method: Explanation, Demonstration, Tactile cues, Verbal cues, and Handouts Education comprehension: verbalized understanding, returned demonstration, verbal cues required, tactile cues required, and needs further education  HOME EXERCISE PROGRAM: Access Code: [email or web address] URL: [email or web address] Date: 10/08/2022 Prepared by: Lorayne Bender  Exercises - Seated Hip Abduction with Resistance  - 1 x daily - 7 x weekly - 3 sets - 10-15 reps - Seated March  - 1 x daily - 7 x weekly - 3 sets - 10-15 reps - Seated Knee Extension with Resistance  - 1 x daily - 7 x weekly - 3 sets - 10-15 reps  ASSESSMENT:  CLINICAL IMPRESSION:  Patient tolerated treatment well.  She had no significant increase in pain with exercises.  We focused on supine exercises at this visit.  Her breathing did well in the supine position.  We reviewed and updated HEP.  We reviewed how to use HEP at home.  Will continue to progress as tolerated.  OBJECTIVE IMPAIRMENTS: Abnormal gait, decreased endurance, difficulty walking, decreased ROM, decreased strength, impaired UE functional use, and pain.   ACTIVITY LIMITATIONS: carrying, standing, stairs, and locomotion level  PARTICIPATION LIMITATIONS: meal prep, cleaning, laundry, driving, shopping, community activity, and yard work  PERSONAL FACTORS: 3+ comorbidities: COPD, polymyalgia rheumatica, headahces  are also affecting patient's functional outcome.   REHAB POTENTIAL: Good  CLINICAL DECISION MAKING: Evolving/moderate complexity declining overall functional mobility   EVALUATION COMPLEXITY: Moderate   GOALS: Goals reviewed with patient? Yes  SHORT TERM GOALS: Target date:  11/05/2022   Patient will increase gross bilateral LE strength by 5 lbs  Baseline: Goal status: INITIAL  2.  Patient will perform 3 or 6 minute walk test  Baseline:  Goal status: INITIAL  3.  Patient will be independent with basic HEP  Baseline:  Goal status: INITIAL  4.  Patients active left shoulder flexion will be 110 without pain  Baseline:  Goal status: INITIAL   LONG TERM GOALS: Target date: 12/03/2022    Patient will stand for 15 minutes without dyspnea and hip pain in order to perform ADL's  Baseline:  Goal status: INITIAL  2.  Patient will ambulate community distances without pain or dyspnea  Baseline:  Goal status: INITIAL  3.  Patient will have a compete exercise program to promote endurance and functional mobility  Baseline:  Goal status: INITIAL   PLAN:  PT FREQUENCY: 1-2x/week  PT DURATION: 8 weeks  PLANNED INTERVENTIONS: Therapeutic exercises, Therapeutic activity, Neuromuscular re-education, Balance training, Gait training, Patient/Family education, Self Care, Joint mobilization, Aquatic Therapy, Dry Needling, Cryotherapy, Moist heat, Taping, Ionotophoresis 4mg /ml Dexamethasone, Manual therapy, and Re-evaluation  PLAN  FOR NEXT SESSION: do 3 or 6 minute walk test if able. The patient would benefit from a general exercise program. She also has left shoulder dysfunction. We can give her a program that would help the left shoulder but not get it more irritated. Consider endurance exercises such as the bike or nu-step. Monitor Sao2.    Dessie Coma, PT 11/25/2022, 9:10 AM

## 2022-11-29 ENCOUNTER — Ambulatory Visit (HOSPITAL_BASED_OUTPATIENT_CLINIC_OR_DEPARTMENT_OTHER): Payer: Medicare Other | Admitting: Physical Therapy

## 2022-11-29 ENCOUNTER — Encounter (HOSPITAL_BASED_OUTPATIENT_CLINIC_OR_DEPARTMENT_OTHER): Payer: Self-pay | Admitting: Physical Therapy

## 2022-11-29 DIAGNOSIS — M6281 Muscle weakness (generalized): Secondary | ICD-10-CM | POA: Diagnosis not present

## 2022-11-29 DIAGNOSIS — R293 Abnormal posture: Secondary | ICD-10-CM | POA: Diagnosis not present

## 2022-11-29 DIAGNOSIS — M25512 Pain in left shoulder: Secondary | ICD-10-CM

## 2022-11-29 DIAGNOSIS — R262 Difficulty in walking, not elsewhere classified: Secondary | ICD-10-CM | POA: Diagnosis not present

## 2022-11-29 NOTE — Therapy (Signed)
OUTPATIENT PHYSICAL THERAPY LOWER EXTREMITY TREATMENT   Patient Name: Sonia [name] MRN: [id number] DOB:April 25, 1940, 82 y.o., female Today's Date: 11/29/2022  END OF SESSION:  PT End of Session - 11/29/22 1315     Visit Number 9    Number of Visits 16    Date for PT Re-Evaluation 12/04/22    PT Start Time 1015    PT Stop Time 1058    PT Time Calculation (min) 43 min    Activity Tolerance Patient tolerated treatment well    Behavior During Therapy [facility] for tasks assessed/performed                Past Medical History:  Diagnosis Date   Arthritis    Asthma    daily and prn inhalers; states good control currently   Breast cancer (HCC)    right   CAD S/P percutaneous coronary angioplasty 07/15/2016   COPD (chronic obstructive pulmonary disease) (HCC)    Dyspnea    occ with exertion   Fibromyalgia    GERD (gastroesophageal reflux disease)    no current med.   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   Vitamin D deficiency    Past Surgical History:  Procedure Laterality Date   ABDOMINAL HYSTERECTOMY     partial   APPENDECTOMY     BREAST BIOPSY Left    BREAST CYST EXCISION Left    BREAST EXCISIONAL BIOPSY Left 03/2015   BREAST LUMPECTOMY Right 08/2018   BREAST LUMPECTOMY WITH RADIOACTIVE SEED LOCALIZATION Left 04/07/2015   Procedure: BREAST LUMPECTOMY WITH RADIOACTIVE SEED LOCALIZATION;  Surgeon: Harriette Bouillon, MD;  Location: [facility];  Service: General;  Laterality: Left;   BREAST LUMPECTOMY WITH RADIOACTIVE SEED LOCALIZATION Right 08/13/2018   Procedure: RIGHT BREAST LUMPECTOMY WITH RADIOACTIVE SEED  LOCALIZATION;  Surgeon: Emelia Loron, MD;  Location: [facility];  Service: General;  Laterality: Right;   COLONOSCOPY     CORONARY STENT INTERVENTION N/A 07/15/2016   Procedure: Coronary Stent Intervention;  Surgeon: Tonny Bollman, MD;  Location: [facility] INVASIVE CV LAB;  Service: Cardiovascular;  Laterality: N/A;   ESOPHAGOGASTRODUODENOSCOPY (EGD) WITH PROPOFOL Left 07/25/2016   Procedure: ESOPHAGOGASTRODUODENOSCOPY (EGD) WITH PROPOFOL;  Surgeon: Kerin Salen, MD;  Location: [facility] ENDOSCOPY;  Service: Gastroenterology;  Laterality: Left;   KNEE ARTHROSCOPY Right 12/07/2003   LAPAROSCOPIC SALPINGO OOPHERECTOMY Right 09/08/2003   LEFT HEART CATH N/A 07/15/2016   Procedure: Left Heart Cath;  Surgeon: Tonny Bollman, MD;  Location: [facility] INVASIVE CV LAB;  Service: Cardiovascular;  Laterality: N/A;   LEFT HEART CATH AND CORONARY ANGIOGRAPHY N/A 04/14/2019   Procedure: LEFT HEART CATH AND CORONARY ANGIOGRAPHY;  Surgeon: Kathleene Hazel, MD;  Location: [facility] INVASIVE CV LAB;  Service: Cardiovascular;  Laterality: N/A;   LYSIS OF ADHESION  09/08/2003   NASAL SINUS SURGERY  age 61   RECTAL POLYPECTOMY  10/02/2006   Patient Active Problem List   Diagnosis Date Noted   Atrial tachycardia 08/07/2022   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 08/04/2022   Hyponatremia 08/04/2022   Thrombocytopenia (HCC) 08/04/2022   COPD with acute  exacerbation (HCC) 03/11/2022   Non-STEMI (non-ST elevated myocardial infarction) (HCC) 03/11/2022   RSV (respiratory syncytial virus pneumonia) 03/11/2022   Acute bronchiolitis due to respiratory syncytial virus (RSV) 03/11/2022   Coronary artery disease of native artery of native heart with stable angina pectoris (HCC)    Asthma, chronic, moderate persistent, uncomplicated 02/03/2019   DOE (dyspnea on exertion) 07/31/2018   Osteopenia 06/04/2018   Carcinoma of upper-outer quadrant of right breast in female, estrogen receptor positive  (HCC) 05/30/2018   GI bleed 07/25/2016   Melena 07/24/2016   Acute renal insufficiency 07/24/2016   CAD S/P percutaneous coronary angioplasty 07/16/2016   NSTEMI (non-ST elevated myocardial infarction) (HCC) 07/13/2016   Genetic testing 06/09/2015   Family history of breast cancer in female 05/10/2015   History of melanoma 05/10/2015   Atypical lobular hyperplasia of left breast 04/26/2015   Vision problem 01/08/2014   Hiatus hernia syndrome 08/01/2012   Palpitations 10/18/2010   Acute chest pain 10/18/2010   Hypothyroidism 10/18/2010   Dyslipidemia 06/07/2009   Upper airway cough syndrome 03/29/2009   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   GERD 12/05/2006    PCP: Jonell Cluck MD  REFERRING PROVIDER: Jonell Cluck MD  REFERRING DIAG:  Diagnosis  E44.0 (ICD-10-CM) - Moderate protein-calorie malnutrition  J45.909 (ICD-10-CM) - Unspecified asthma, uncomplicated  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  She feels like it might be the weather.  She mostly feels it in her knees.    PERTINENT HISTORY: Polymyalgia, arthritis, asthma ( worse int he morning); MI 3 years prior; headaches mostly on rainy days; Hashimoto thyroiditis. Old knee scope but doing well.Marland Kitchen  PAIN:  Are you having pain? Yes: NPRS scale: 4/10 Pain location: bilateral knees  Pain description: aching  Aggravating factors: sit to stand transfers Relieving factors: standing and walking improves the stiffness  PRECAUTIONS: None  RED FLAGS: None   WEIGHT BEARING  RESTRICTIONS: No  FALLS:  Has patient fallen in last 6 months? No collapsed prior to the hospital  LIVING ENVIRONMENT: 3 steps into the house but has a railing   OCCUPATION: Retired Had a walk in Advertising copywriter put in    PLOF: Independent  PATIENT GOALS:  Strength and general mobility    NEXT MD VISIT:  Nothing scheduled   OBJECTIVE:   DIAGNOSTIC FINDINGS:   CXR: IMPRESSION: No active cardiopulmonary disease.   Stable large hiatal hernia.  PATIENT SURVEYS:  Incorrect set up   COGNITION: Overall cognitive status: Within functional limits for tasks assessed     SENSATION: Neuropathy in the right foot   POSTURE: No Significant postural limitations     PALPATION:     (Blank rows = not tested)  UPPER EXTREMITY ROM:  Active ROM Right eval Left eval  Shoulder flexion  80 degrees with pain   Shoulder extension    Shoulder abduction    Shoulder adduction    Shoulder extension    Shoulder internal rotation    Shoulder external rotation    Elbow flexion    Elbow extension    Wrist flexion    Wrist extension    Wrist  ulnar deviation    Wrist radial deviation    Wrist pronation    Wrist supination     (Blank rows = not tested)  LOWER EXTREMITY MMT:  MMT Right eval Left eval  Hip flexion 17.4 16.6  Hip extension    Hip abduction 16.2 18.4  Hip adduction    Hip internal rotation    Hip external rotation    Knee flexion    Knee extension Unable to do 2nd to tenderness in her legs Unable to do 2nd to tenderness in her legs   Ankle dorsiflexion    Ankle plantarflexion    Ankle inversion    Ankle eversion     (Blank rows = not tested)  FUNCTIONAL TESTS:  5x sit to stand test: 11 sec mild pain in the knees   Unable to do 6 min walk test today GAIT: Limited hip rotation with gait; fed forward posture; Vitals   Sao2 96-97%  HR 91    TODAY'S TREATMENT:                                                                                                                               DATE:   9/19 NuStep 5 minutes level 4  Punches 2 pound 3 x 10 Bicep curl 3 x 10 2 pound Shoulder press 3x10 2 lbs mild pain in the right shoulder on second set   Step over hurdle 2x10   Step onto air-ex 2x10 fwd and lateral each leg   Hell/toe rock to end ranges 2x10    9/15 NuStep 5 minutes level 4  Supine: LTR x 20 Supine march 2 x 15 each leg Supine hip abduction red band 2 x 15 Supine bridge 2 x 10 SAQ 2 x 10  Seated heel raise 2 x 10    9/10 Punches 3 pound 3 x 10 Bicep curl 3 x 10 3 pound Shoulder press 3x10 2 lbs mild pain in the right shoulder on second set '  Nu-step 3 2 min intervals while monitoring dyspnea   9/6 Punches 3 pound 3 x 10 Bicep curl 3 x 10 3 pound  Hip abduction 3 x 10 green    Standing hurdle step over x15 fwd and side to side   Step onto air-ex x20 each leg         8/27  UE: Punches 1 pound 3 x 10 Bicep curl 3 x 10 1 pound Bilateral ER 3 x 10 yellow  LE: LAQ 3 x 10 red March 3 x 10 red Hip abduction 3 x 10 red Ball squeeze 3 x 10 red   8/23:  STM L UT UT stretch 30s x3L Scap retraction 3" 2x10 Door way stretch 10s x3L  Piriformis stretch 30s x2ea Discussion of posture with reading and laying in bed Supine SLR 2x10ea Update and review of HEP      8/20:  STM to R gluteal mm, ITB, lateral HS,  QL  Manual stretch R HS, piriformis/fig 4  Nerve mobilizaiton in supine with manual HS stretch and ankle pumps x10  Seated LAQ 2.5# 5" hold 2x10 ea Seated QL stretch  Discussion of daily activity modifications and foot wear    8/6: Seated marching x20 Seated LAQ 1.5# 2x10 (increase next time) Seated clam Blue TB 2x10 Seated adductor squeeze 5" 2x10  Standing HR 2x10 Standing march 2x10 Sidestepping at rail no UE (SBA) x2 laps  Sit to stands 2x5 from chair (no UE)  Tandem stance x 20sec ea Romberg stance with EC x30sec (SBA)  Pulleys ea flexion and  scaption Ball rolls at plinth x10 ea flexion and scaption Scap retraction x15 Pendulums x10     Access Code: [email or web address] URL: [email or web address] Date: 10/08/2022 Prepared by: Lorayne Bender  Exercises - Seated Hip Abduction with Resistance  - 1 x daily - 7 x weekly - 3 sets - 10-15 reps - Seated March  - 1 x daily - 7 x weekly - 3 sets - 10-15 reps - Seated Knee Extension with Resistance  - 1 x daily - 7 x weekly - 3 sets - 10-15 reps  PATIENT EDUCATION:  Education details: HEP, symptom management.  Person educated: Patient Education method: Explanation, Demonstration, Tactile cues, Verbal cues, and Handouts Education comprehension: verbalized understanding, returned demonstration, verbal cues required, tactile cues required, and needs further education  HOME EXERCISE PROGRAM: Access Code: [email or web address] URL: [email or web address] Date: 10/08/2022 Prepared by: Lorayne Bender  Exercises - Seated Hip Abduction with Resistance  - 1 x daily - 7 x weekly - 3 sets - 10-15 reps - Seated March  - 1 x daily - 7 x weekly - 3 sets - 10-15 reps - Seated Knee Extension with Resistance  - 1 x daily - 7 x weekly - 3 sets - 10-15 reps  ASSESSMENT:  CLINICAL IMPRESSION: The patient continues to make good progress. She came in with baseline dyspnea today but was still able to do most of her exercises. We also worked on standing balance exercises. She had more dyspnea with these exercises but recovered quickly. We will continue to progress patient as tolerated.   OBJECTIVE IMPAIRMENTS: Abnormal gait, decreased endurance, difficulty walking, decreased ROM, decreased strength, impaired UE functional use, and pain.   ACTIVITY LIMITATIONS: carrying, standing, stairs, and locomotion level  PARTICIPATION LIMITATIONS: meal prep, cleaning, laundry, driving, shopping, community activity, and yard work  PERSONAL FACTORS: 3+ comorbidities: COPD, polymyalgia rheumatica, headahces   are also affecting patient's functional outcome.   REHAB POTENTIAL: Good  CLINICAL DECISION MAKING: Evolving/moderate complexity declining overall functional mobility   EVALUATION COMPLEXITY: Moderate   GOALS: Goals reviewed with patient? Yes  SHORT TERM GOALS: Target date: 11/05/2022   Patient will increase gross bilateral LE strength by 5 lbs  Baseline: Goal status: INITIAL  2.  Patient will perform 3 or 6 minute walk test  Baseline:  Goal status: INITIAL  3.  Patient will be independent with basic HEP  Baseline:  Goal status: INITIAL  4.  Patients active left shoulder flexion will be 110 without pain  Baseline:  Goal status: INITIAL   LONG TERM GOALS: Target date: 12/03/2022    Patient will stand for 15 minutes without dyspnea and hip pain in order to perform ADL's  Baseline:  Goal status: INITIAL  2.  Patient will ambulate community distances without pain or dyspnea  Baseline:  Goal status: INITIAL  3.  Patient will have a compete exercise program to promote endurance  and functional mobility  Baseline:  Goal status: INITIAL   PLAN:  PT FREQUENCY: 1-2x/week  PT DURATION: 8 weeks  PLANNED INTERVENTIONS: Therapeutic exercises, Therapeutic activity, Neuromuscular re-education, Balance training, Gait training, Patient/Family education, Self Care, Joint mobilization, Aquatic Therapy, Dry Needling, Cryotherapy, Moist heat, Taping, Ionotophoresis 4mg /ml Dexamethasone, Manual therapy, and Re-evaluation  PLAN FOR NEXT SESSION: do 3 or 6 minute walk test if able. The patient would benefit from a general exercise program. She also has left shoulder dysfunction. We can give her a program that would help the left shoulder but not get it more irritated. Consider endurance exercises such as the bike or nu-step. Monitor Sao2.    Dessie Coma, PT 11/29/2022, 1:16 PM

## 2022-12-10 ENCOUNTER — Ambulatory Visit (HOSPITAL_BASED_OUTPATIENT_CLINIC_OR_DEPARTMENT_OTHER): Payer: Medicare Other

## 2022-12-11 ENCOUNTER — Ambulatory Visit (HOSPITAL_BASED_OUTPATIENT_CLINIC_OR_DEPARTMENT_OTHER): Payer: Medicare Other | Attending: Family Medicine | Admitting: Physical Therapy

## 2022-12-11 DIAGNOSIS — R293 Abnormal posture: Secondary | ICD-10-CM

## 2022-12-11 DIAGNOSIS — M6281 Muscle weakness (generalized): Secondary | ICD-10-CM

## 2022-12-11 DIAGNOSIS — R262 Difficulty in walking, not elsewhere classified: Secondary | ICD-10-CM | POA: Diagnosis not present

## 2022-12-11 DIAGNOSIS — M25512 Pain in left shoulder: Secondary | ICD-10-CM | POA: Diagnosis not present

## 2022-12-11 NOTE — Therapy (Incomplete)
OUTPATIENT PHYSICAL THERAPY LOWER EXTREMITY TREATMENT   Patient Name: Sonia [name] MRN: [id number] DOB:Aug 06, 1940, 82 y.o., female Today's Date: 12/12/2022  END OF SESSION:  PT End of Session - 12/11/22 1501     Visit Number 10    Number of Visits 20    Date for PT Re-Evaluation 01/22/23    Authorization Type MCR A &B    PT Start Time 1414    PT Stop Time 1454    PT Time Calculation (min) 40 min    Activity Tolerance Patient tolerated treatment well    Behavior During Therapy WFL for tasks assessed/performed                 Past Medical History:  Diagnosis Date   Arthritis    Asthma    daily and prn inhalers; states good control currently   Breast cancer (HCC)    right   CAD S/P percutaneous coronary angioplasty 07/15/2016   COPD (chronic obstructive pulmonary disease) (HCC)    Dyspnea    occ with exertion   Fibromyalgia    GERD (gastroesophageal reflux disease)    no current med.   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   Vitamin D deficiency    Past Surgical History:  Procedure Laterality Date   ABDOMINAL HYSTERECTOMY     partial   APPENDECTOMY     BREAST BIOPSY Left    BREAST CYST EXCISION Left    BREAST EXCISIONAL BIOPSY Left 03/2015   BREAST LUMPECTOMY Right 08/2018   BREAST LUMPECTOMY WITH RADIOACTIVE SEED LOCALIZATION Left 04/07/2015   Procedure: BREAST LUMPECTOMY WITH RADIOACTIVE SEED LOCALIZATION;  Surgeon: Harriette Bouillon, MD;  Location: [facility];  Service: General;  Laterality: Left;   BREAST LUMPECTOMY WITH RADIOACTIVE SEED LOCALIZATION Right 08/13/2018   Procedure: RIGHT BREAST  LUMPECTOMY WITH RADIOACTIVE SEED LOCALIZATION;  Surgeon: Emelia Loron, MD;  Location: [facility];  Service: General;  Laterality: Right;   COLONOSCOPY     CORONARY STENT INTERVENTION N/A 07/15/2016   Procedure: Coronary Stent Intervention;  Surgeon: Tonny Bollman, MD;  Location: [facility] INVASIVE CV LAB;  Service: Cardiovascular;  Laterality: N/A;   ESOPHAGOGASTRODUODENOSCOPY (EGD) WITH PROPOFOL Left 07/25/2016   Procedure: ESOPHAGOGASTRODUODENOSCOPY (EGD) WITH PROPOFOL;  Surgeon: Kerin Salen, MD;  Location: [facility] ENDOSCOPY;  Service: Gastroenterology;  Laterality: Left;   KNEE ARTHROSCOPY Right 12/07/2003   LAPAROSCOPIC SALPINGO OOPHERECTOMY Right 09/08/2003   LEFT HEART CATH N/A 07/15/2016   Procedure: Left Heart Cath;  Surgeon: Tonny Bollman, MD;  Location: [facility] INVASIVE CV LAB;  Service: Cardiovascular;  Laterality: N/A;   LEFT HEART CATH AND CORONARY ANGIOGRAPHY N/A 04/14/2019   Procedure: LEFT HEART CATH AND CORONARY ANGIOGRAPHY;  Surgeon: Kathleene Hazel, MD;  Location: [facility] INVASIVE CV LAB;  Service: Cardiovascular;  Laterality: N/A;   LYSIS OF ADHESION  09/08/2003   NASAL SINUS SURGERY  age 53   RECTAL POLYPECTOMY  10/02/2006   Patient Active Problem List   Diagnosis Date Noted   Atrial tachycardia (HCC) 08/07/2022   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 08/04/2022   Hyponatremia 08/04/2022  Thrombocytopenia (HCC) 08/04/2022   COPD with acute exacerbation (HCC) 03/11/2022   Non-STEMI (non-ST elevated myocardial infarction) (HCC) 03/11/2022   RSV (respiratory syncytial virus pneumonia) 03/11/2022   Acute bronchiolitis due to respiratory syncytial virus (RSV) 03/11/2022   Coronary artery disease of native artery of native heart with stable angina pectoris (HCC)    Asthma, chronic, moderate persistent, uncomplicated 02/03/2019   DOE (dyspnea on exertion) 07/31/2018   Osteopenia 06/04/2018   Carcinoma of upper-outer quadrant of right breast  in female, estrogen receptor positive (HCC) 05/30/2018   GI bleed 07/25/2016   Melena 07/24/2016   Acute renal insufficiency 07/24/2016   CAD S/P percutaneous coronary angioplasty 07/16/2016   NSTEMI (non-ST elevated myocardial infarction) (HCC) 07/13/2016   Genetic testing 06/09/2015   Family history of breast cancer in female 05/10/2015   History of melanoma 05/10/2015   Atypical lobular hyperplasia of left breast 04/26/2015   Vision problem 01/08/2014   Hiatus hernia syndrome 08/01/2012   Palpitations 10/18/2010   Acute chest pain 10/18/2010   Hypothyroidism 10/18/2010   Dyslipidemia 06/07/2009   Upper airway cough syndrome 03/29/2009   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   GERD 12/05/2006    PCP: Mila Palmer, MD  REFERRING PROVIDER: Mila Palmer,   REFERRING DIAG:  Diagnosis  E44.0 (ICD-10-CM) - Moderate protein-calorie malnutrition  J45.909 (ICD-10-CM) - Unspecified asthma, uncomplicated  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  Pt reports she has been performing her home exercises.  Pt states if it's overcast, everything bothers her.  She feels much better when the sun comes out.  Pt states she has improved a great deal in therapy.  Pt reports improved ambulation and has improved tolerance with increased ambulation distance.   "Overall I think I'm doing much better than when I came here."  Pt did a lot of household cleaning and had increased upper back pain.        PERTINENT HISTORY: Polymyalgia, arthritis, asthma ( worse int he morning); MI 3 years prior; headaches mostly on rainy days; Hashimoto thyroiditis. Old knee scope but doing well.Marland Kitchen  PAIN:  Are you having pain? Yes: NPRS scale: 0/10 Pain location: bilateral knees  Pain description: aching  Aggravating factors: sit to stand transfers Relieving factors: standing and walking improves the stiffness  PRECAUTIONS: None  RED FLAGS: None   WEIGHT BEARING RESTRICTIONS: No  FALLS:  Has patient fallen in last 6 months? No collapsed prior to the hospital  LIVING ENVIRONMENT: 3 steps into the house but has a railing   OCCUPATION: Retired Had a walk in Advertising copywriter put in    PLOF: Independent  PATIENT GOALS:  Strength and general mobility    NEXT MD VISIT:  Nothing scheduled   OBJECTIVE:   DIAGNOSTIC FINDINGS:   CXR: IMPRESSION: No active cardiopulmonary disease.   Stable large hiatal hernia.  PATIENT SURVEYS:  Incorrect set up          TODAY'S TREATMENT:  NuStep 5 minutes level 4 Vitals in sitting after Nustep HR:  84 O2:  96%   LOWER EXTREMITY MMT:  MMT Right eval Left eval Right 10/1 Left 10/1  Hip flexion 17.4 16.6 24.4 23.5  Hip extension      Hip abduction 16.2 18.4 14.9   Pt was tender with pushing 16.2  pt was tender with pushing  Hip adduction      Hip internal rotation      Hip external rotation      Knee flexion      Knee extension Unable to do 2nd to tenderness in her legs Unable to do 2nd to tenderness in her legs     Ankle dorsiflexion      Ankle plantarflexion      Ankle inversion      Ankle eversion       (Blank rows = not tested)  UPPER EXTREMITY ROM:   ROM Right eval Left eval Left 10/1  Shoulder flexion  80 degrees with pain  135 with less pain  Shoulder extension     Shoulder abduction      Shoulder adduction     Shoulder extension     Shoulder internal rotation     Shoulder external rotation     Elbow flexion     Elbow extension     Wrist flexion     Wrist extension     Wrist ulnar deviation     Wrist radial deviation     Wrist pronation     Wrist supination      (Blank rows = not tested)   FUNCTIONAL TESTS:  3 MWT:  Pt had labored breathing and did not complete the test.  PT had pt sit down.  Her O2 was good.  O2:  98%, HR:  107 which decreased to 85 with rest.  Pt used an inhaler when she sat down.  Pt ambulated 345 ft.  Punches 2 pound 2 x 10 R UE Bicep curl 2 x 10 2 pound bilat     Access Code: [email or web address] URL: [email or web address] Date: 10/08/2022 Prepared by: Lorayne Bender  Exercises - Seated Hip Abduction with Resistance  - 1 x daily - 7 x weekly - 3 sets - 10-15 reps - Seated March  - 1 x daily - 7 x weekly - 3 sets - 10-15 reps - Seated Knee Extension with Resistance  - 1 x daily - 7 x weekly - 3 sets - 10-15 reps  PATIENT EDUCATION:  Education details: HEP, symptom management.  Person educated: Patient Education method: Explanation, Demonstration, Tactile cues, Verbal cues Education comprehension: verbalized understanding, returned demonstration, verbal cues required, tactile cues required, and needs further education  HOME EXERCISE PROGRAM: Access Code: [email or web address] URL: [email or web address] Date: 10/08/2022 Prepared by: Lorayne Bender  Exercises - Seated Hip Abduction with Resistance  - 1 x daily - 7 x weekly - 3 sets - 10-15 reps - Seated March  - 1 x daily - 7 x weekly - 3 sets - 10-15 reps - Seated Knee Extension with Resistance  - 1 x daily - 7 x weekly - 3 sets - 10-15 reps  ASSESSMENT:  CLINICAL IMPRESSION: Pt is making progress in PT as evidenced by subjective reports.  She continues to be limited with activities and has dyspnea with activity.  Pt reports improved ambulation and improved tolerance with  increased ambulation distance.  Pt is fatigued with household cleaning though seems to be able to do more.  She demonstrates  improved L shoulder flexion AROM.  Pt demonstrates improved bilat hip flexion strength though is weaker in bilat hip abduction.  Pt performed 3 MWT though unable to ambulate for entire test.  Pt had labored breathing and Pt had PT sit down.  She was fatigued with ambulating 345 ft.  Pt met STG's #2,3,4 and partially met STG #1.  Pt should benefit from cont skilled PT services to address ongoing goals and to assist in restoring desired level of function.   OBJECTIVE IMPAIRMENTS: Abnormal gait, decreased endurance, difficulty walking, decreased ROM, decreased strength, impaired UE functional use, and pain.   ACTIVITY LIMITATIONS: carrying, standing, stairs, and locomotion level  PARTICIPATION LIMITATIONS: meal prep, cleaning, laundry, driving, shopping, community activity, and yard work  PERSONAL FACTORS: 3+ comorbidities: COPD, polymyalgia rheumatica, headahces  are also affecting patient's functional outcome.   REHAB POTENTIAL: Good  CLINICAL DECISION MAKING: Evolving/moderate complexity declining overall functional mobility   EVALUATION COMPLEXITY: Moderate   GOALS: Goals reviewed with patient? Yes  SHORT TERM GOALS: Target date: 11/05/2022   Patient will increase gross bilateral LE strength by 5 lbs  Baseline: Goal status: partially met  2.  Patient will perform 3 or 6 minute walk test  Baseline:  Goal status: GOAL MET  3.  Patient will be independent with basic HEP  Baseline:  Goal status: GOAL MET  4.  Patients active left shoulder flexion will be 110 without pain  Baseline:  Goal status: GOAL MET   LONG TERM GOALS: Target date: 01/22/2023    Patient will stand for 15 minutes without dyspnea and hip pain in order to perform ADL's  Baseline:  Goal status: ONGOING  2.  Patient will ambulate community distances without pain or dyspnea  Baseline:   Goal status: ONGOING  3.  Patient will have a compete exercise program to promote endurance and functional mobility  Baseline:  Goal status: ONGOING   PLAN:  PT FREQUENCY: 1-2x/week  PT DURATION: 5-6 weeks  PLANNED INTERVENTIONS: Therapeutic exercises, Therapeutic activity, Neuromuscular re-education, Balance training, Gait training, Patient/Family education, Self Care, Joint mobilization, Aquatic Therapy, Dry Needling, Cryotherapy, Moist heat, Taping, Ionotophoresis 4mg /ml Dexamethasone, Manual therapy, and Re-evaluation  PLAN FOR NEXT SESSION: Cont working on strength, proprioception, and functional endurance.  do 3 or 6 minute walk test if able. The patient would benefit from a general exercise program. She also has left shoulder dysfunction. We can give her a program that would help the left shoulder but not get it more irritated. Consider endurance exercises such as the bike or nu-step. Monitor Sao[place].    Audie Clear III PT, DPT 12/12/22 9:51 PM

## 2022-12-12 ENCOUNTER — Encounter (HOSPITAL_BASED_OUTPATIENT_CLINIC_OR_DEPARTMENT_OTHER): Payer: Self-pay | Admitting: Physical Therapy

## 2022-12-13 ENCOUNTER — Ambulatory Visit (HOSPITAL_BASED_OUTPATIENT_CLINIC_OR_DEPARTMENT_OTHER): Payer: Medicare Other | Admitting: Physical Therapy

## 2022-12-17 ENCOUNTER — Ambulatory Visit (HOSPITAL_BASED_OUTPATIENT_CLINIC_OR_DEPARTMENT_OTHER): Payer: Medicare Other | Admitting: Physical Therapy

## 2022-12-17 ENCOUNTER — Encounter (HOSPITAL_BASED_OUTPATIENT_CLINIC_OR_DEPARTMENT_OTHER): Payer: Self-pay

## 2022-12-17 DIAGNOSIS — Z79899 Other long term (current) drug therapy: Secondary | ICD-10-CM | POA: Diagnosis not present

## 2022-12-17 DIAGNOSIS — R293 Abnormal posture: Secondary | ICD-10-CM

## 2022-12-17 DIAGNOSIS — M6281 Muscle weakness (generalized): Secondary | ICD-10-CM

## 2022-12-17 DIAGNOSIS — Z0000 Encounter for general adult medical examination without abnormal findings: Secondary | ICD-10-CM | POA: Diagnosis not present

## 2022-12-17 DIAGNOSIS — E063 Autoimmune thyroiditis: Secondary | ICD-10-CM | POA: Diagnosis not present

## 2022-12-17 DIAGNOSIS — R262 Difficulty in walking, not elsewhere classified: Secondary | ICD-10-CM

## 2022-12-17 DIAGNOSIS — E7800 Pure hypercholesterolemia, unspecified: Secondary | ICD-10-CM | POA: Diagnosis not present

## 2022-12-17 DIAGNOSIS — M25512 Pain in left shoulder: Secondary | ICD-10-CM

## 2022-12-17 DIAGNOSIS — E039 Hypothyroidism, unspecified: Secondary | ICD-10-CM | POA: Diagnosis not present

## 2022-12-17 DIAGNOSIS — R7303 Prediabetes: Secondary | ICD-10-CM | POA: Diagnosis not present

## 2022-12-17 DIAGNOSIS — I2510 Atherosclerotic heart disease of native coronary artery without angina pectoris: Secondary | ICD-10-CM | POA: Diagnosis not present

## 2022-12-17 LAB — LAB REPORT - SCANNED
A1c: 5.9
EGFR: 54

## 2022-12-17 NOTE — Therapy (Signed)
Patient arrived 28 minutes late for appointment. Therapy unable to treat patient. She will return Thursday.

## 2022-12-19 ENCOUNTER — Encounter (HOSPITAL_BASED_OUTPATIENT_CLINIC_OR_DEPARTMENT_OTHER): Payer: Self-pay | Admitting: Physical Therapy

## 2022-12-19 ENCOUNTER — Ambulatory Visit (HOSPITAL_BASED_OUTPATIENT_CLINIC_OR_DEPARTMENT_OTHER): Payer: Medicare Other | Admitting: Physical Therapy

## 2022-12-19 DIAGNOSIS — M6281 Muscle weakness (generalized): Secondary | ICD-10-CM

## 2022-12-19 DIAGNOSIS — M25512 Pain in left shoulder: Secondary | ICD-10-CM

## 2022-12-19 DIAGNOSIS — R293 Abnormal posture: Secondary | ICD-10-CM | POA: Diagnosis not present

## 2022-12-19 DIAGNOSIS — R262 Difficulty in walking, not elsewhere classified: Secondary | ICD-10-CM | POA: Diagnosis not present

## 2022-12-19 NOTE — Therapy (Signed)
OUTPATIENT PHYSICAL THERAPY LOWER EXTREMITY TREATMENT   Patient Name: Sonia [name] MRN: [id number] DOB:[birth year], 82 y.o., female Today's Date: 12/19/2022  END OF SESSION:  PT End of Session - 12/19/22 1348     Visit Number 11    Number of Visits 20    Date for PT Re-Evaluation 01/22/23    Authorization Type MCR A &B    PT Start Time 1346    PT Stop Time 1428    PT Time Calculation (min) 42 min    Activity Tolerance Patient tolerated treatment well    Behavior During Therapy WFL for tasks assessed/performed                 Past Medical History:  Diagnosis Date   Arthritis    Asthma    daily and prn inhalers; states good control currently   Breast cancer (HCC)    right   CAD S/P percutaneous coronary angioplasty 07/15/2016   COPD (chronic obstructive pulmonary disease) (HCC)    Dyspnea    occ with exertion   Fibromyalgia    GERD (gastroesophageal reflux disease)    no current med.   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   Vitamin D deficiency    Past Surgical History:  Procedure Laterality Date   ABDOMINAL HYSTERECTOMY     partial   APPENDECTOMY     BREAST BIOPSY Left    BREAST CYST EXCISION Left    BREAST EXCISIONAL BIOPSY Left 03/2015   BREAST LUMPECTOMY Right 08/2018   BREAST LUMPECTOMY WITH RADIOACTIVE SEED LOCALIZATION Left 04/07/2015   Procedure: BREAST LUMPECTOMY WITH RADIOACTIVE SEED LOCALIZATION;  Surgeon: Harriette Bouillon, MD;  Location: [facility];  Service: General;  Laterality: Left;   BREAST LUMPECTOMY WITH RADIOACTIVE SEED LOCALIZATION Right 08/13/2018   Procedure: RIGHT BREAST  LUMPECTOMY WITH RADIOACTIVE SEED LOCALIZATION;  Surgeon: Emelia Loron, MD;  Location: [facility];  Service: General;  Laterality: Right;   COLONOSCOPY     CORONARY STENT INTERVENTION N/A 07/15/2016   Procedure: Coronary Stent Intervention;  Surgeon: Tonny Bollman, MD;  Location: [facility] INVASIVE CV LAB;  Service: Cardiovascular;  Laterality: N/A;   ESOPHAGOGASTRODUODENOSCOPY (EGD) WITH PROPOFOL Left 07/25/2016   Procedure: ESOPHAGOGASTRODUODENOSCOPY (EGD) WITH PROPOFOL;  Surgeon: Kerin Salen, MD;  Location: [facility] ENDOSCOPY;  Service: Gastroenterology;  Laterality: Left;   KNEE ARTHROSCOPY Right 12/07/2003   LAPAROSCOPIC SALPINGO OOPHERECTOMY Right 09/08/2003   LEFT HEART CATH N/A 07/15/2016   Procedure: Left Heart Cath;  Surgeon: Tonny Bollman, MD;  Location: [facility] INVASIVE CV LAB;  Service: Cardiovascular;  Laterality: N/A;   LEFT HEART CATH AND CORONARY ANGIOGRAPHY N/A 04/14/2019   Procedure: LEFT HEART CATH AND CORONARY ANGIOGRAPHY;  Surgeon: Kathleene Hazel, MD;  Location: [facility] INVASIVE CV LAB;  Service: Cardiovascular;  Laterality: N/A;   LYSIS OF ADHESION  09/08/2003   NASAL SINUS SURGERY  age 71   RECTAL POLYPECTOMY  10/02/2006   Patient Active Problem List   Diagnosis Date Noted   Atrial tachycardia (HCC) 08/07/2022   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 08/04/2022   Hyponatremia 08/04/2022  Thrombocytopenia (HCC) 08/04/2022   COPD with acute exacerbation (HCC) 03/11/2022   Non-STEMI (non-ST elevated myocardial infarction) (HCC) 03/11/2022   RSV (respiratory syncytial virus pneumonia) 03/11/2022   Acute bronchiolitis due to respiratory syncytial virus (RSV) 03/11/2022   Coronary artery disease of native artery of native heart with stable angina pectoris (HCC)    Asthma, chronic, moderate persistent, uncomplicated 02/03/2019   DOE (dyspnea on exertion) 07/31/2018   Osteopenia 06/04/2018   Carcinoma of upper-outer quadrant of right breast  in female, estrogen receptor positive (HCC) 05/30/2018   GI bleed 07/25/2016   Melena 07/24/2016   Acute renal insufficiency 07/24/2016   CAD S/P percutaneous coronary angioplasty 07/16/2016   NSTEMI (non-ST elevated myocardial infarction) (HCC) 07/13/2016   Genetic testing 06/09/2015   Family history of breast cancer in female 05/10/2015   History of melanoma 05/10/2015   Atypical lobular hyperplasia of left breast 04/26/2015   Vision problem 01/08/2014   Hiatus hernia syndrome 08/01/2012   Palpitations 10/18/2010   Acute chest pain 10/18/2010   Hypothyroidism 10/18/2010   Dyslipidemia 06/07/2009   Upper airway cough syndrome 03/29/2009   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   GERD 12/05/2006    PCP: Mila Palmer, MD  REFERRING PROVIDER: Mila Palmer,   REFERRING DIAG:  Diagnosis  E44.0 (ICD-10-CM) - Moderate protein-calorie malnutrition  J45.909 (ICD-10-CM) - Unspecified asthma, uncomplicated  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 She feels like her knees are hurting.   Pt states she has improved a great deal in therapy.  Pt reports improved ambulation and has improved tolerance with increased ambulation distance.   "Overall I think I'm doing much better than when I came here."  Pt did a lot of household cleaning and had increased upper back pain.       PERTINENT HISTORY: Polymyalgia, arthritis, asthma ( worse int he morning); MI 3 years prior; headaches mostly on rainy days; Hashimoto  thyroiditis. Old knee scope but doing well.Marland Kitchen  PAIN:  Are you having pain? Yes: NPRS scale: 0/10 Pain location: bilateral knees  Pain description: aching  Aggravating factors: sit to stand transfers Relieving factors: standing and walking improves the stiffness  PRECAUTIONS: None  RED FLAGS: None   WEIGHT BEARING RESTRICTIONS: No  FALLS:  Has patient fallen in last 6 months? No collapsed prior to the hospital  LIVING ENVIRONMENT: 3 steps into the house but has a railing   OCCUPATION: Retired Had a walk in Advertising copywriter put in    PLOF: Independent  PATIENT GOALS:  Strength and general mobility    NEXT MD VISIT:  Nothing scheduled   OBJECTIVE:   DIAGNOSTIC FINDINGS:   CXR: IMPRESSION: No active cardiopulmonary disease.   Stable large hiatal hernia.  PATIENT SURVEYS:  Incorrect set up    LOWER EXTREMITY MMT:  MMT Right eval Left eval Right 10/1 Left 10/1  Hip flexion 17.4 16.6 24.4 23.5  Hip extension      Hip abduction 16.2 18.4 14.9   Pt was tender with pushing 16.2  pt was tender with pushing  Hip adduction  Hip internal rotation      Hip external rotation      Knee flexion      Knee extension Unable to do 2nd to tenderness in her legs Unable to do 2nd to tenderness in her legs     Ankle dorsiflexion      Ankle plantarflexion      Ankle inversion      Ankle eversion       (Blank rows = not tested)  UPPER EXTREMITY ROM:   ROM Right eval Left eval Left 10/1  Shoulder flexion  80 degrees with pain  135 with less pain  Shoulder extension     Shoulder abduction     Shoulder adduction     Shoulder extension     Shoulder internal rotation     Shoulder external rotation     Elbow flexion     Elbow extension     Wrist flexion     Wrist extension     Wrist ulnar deviation     Wrist radial deviation     Wrist pronation     Wrist supination      (Blank rows = not tested)   FUNCTIONAL TESTS:  3 MWT:  Pt had labored breathing and did not  complete the test.  PT had pt sit down.  Her O2 was good.  O2:  98%, HR:  107 which decreased to 85 with rest.  Pt used an inhaler when she sat down.  Pt ambulated 345 ft.  Punches 2 pound 2 x 10 R UE Bicep curl 2 x 10 2 pound bilat        TODAY'S TREATMENT:                                                                                                                                10/9 Nu-step 5 min   Hurdle walk 4 laps side ways and forward and back   Step onto air-ex fwd and lateral 2x10 each leg   Punch 2 lbs 2x10  Biceps curl 3x12  3lbs    Manual: trigger point release to upper trap   Progress Note:   NuStep 5 minutes level 4 Vitals in sitting after Nustep HR:  84 O2:  96%     Access Code: [email or web address] URL: [email or web address] Date: 10/08/2022 Prepared by: Lorayne Bender  Exercises - Seated Hip Abduction with Resistance  - 1 x daily - 7 x weekly - 3 sets - 10-15 reps - Seated March  - 1 x daily - 7 x weekly - 3 sets - 10-15 reps - Seated Knee Extension with Resistance  - 1 x daily - 7 x weekly - 3 sets - 10-15 reps  PATIENT EDUCATION:  Education details: HEP, symptom management.  Person educated: Patient Education method: Explanation, Demonstration, Tactile cues, Verbal cues Education comprehension: verbalized understanding, returned demonstration, verbal cues required, tactile cues required, and needs further education  HOME EXERCISE  PROGRAM: Access Code: [email or web address] URL: [email or web address] Date: 10/08/2022 Prepared by: Lorayne Bender  Exercises - Seated Hip Abduction with Resistance  - 1 x daily - 7 x weekly - 3 sets - 10-15 reps - Seated March  - 1 x daily - 7 x weekly - 3 sets - 10-15 reps - Seated Knee Extension with Resistance  - 1 x daily - 7 x weekly - 3 sets - 10-15 reps  ASSESSMENT:  CLINICAL IMPRESSION: Te patient tolerated treatment well. She performed standing dynamic mobility exercises. She had mild to  moderate dyspnea with exercises. We also worked on her tightness in her neck. She has spasming in her neck. She reports it is difficult turning to the right. She had improvement after manual therapy. We will continue to progress as tolerated.  OBJECTIVE IMPAIRMENTS: Abnormal gait, decreased endurance, difficulty walking, decreased ROM, decreased strength, impaired UE functional use, and pain.   ACTIVITY LIMITATIONS: carrying, standing, stairs, and locomotion level  PARTICIPATION LIMITATIONS: meal prep, cleaning, laundry, driving, shopping, community activity, and yard work  PERSONAL FACTORS: 3+ comorbidities: COPD, polymyalgia rheumatica, headahces  are also affecting patient's functional outcome.   REHAB POTENTIAL: Good  CLINICAL DECISION MAKING: Evolving/moderate complexity declining overall functional mobility   EVALUATION COMPLEXITY: Moderate   GOALS: Goals reviewed with patient? Yes  SHORT TERM GOALS: Target date: 11/05/2022   Patient will increase gross bilateral LE strength by 5 lbs  Baseline: Goal status: partially met  2.  Patient will perform 3 or 6 minute walk test  Baseline:  Goal status: GOAL MET  3.  Patient will be independent with basic HEP  Baseline:  Goal status: GOAL MET  4.  Patients active left shoulder flexion will be 110 without pain  Baseline:  Goal status: GOAL MET   LONG TERM GOALS: Target date: 01/22/2023    Patient will stand for 15 minutes without dyspnea and hip pain in order to perform ADL's  Baseline:  Goal status: ONGOING  2.  Patient will ambulate community distances without pain or dyspnea  Baseline:  Goal status: ONGOING  3.  Patient will have a compete exercise program to promote endurance and functional mobility  Baseline:  Goal status: ONGOING   PLAN:  PT FREQUENCY: 1-2x/week  PT DURATION: 5-6 weeks  PLANNED INTERVENTIONS: Therapeutic exercises, Therapeutic activity, Neuromuscular re-education, Balance training, Gait  training, Patient/Family education, Self Care, Joint mobilization, Aquatic Therapy, Dry Needling, Cryotherapy, Moist heat, Taping, Ionotophoresis 4mg /ml Dexamethasone, Manual therapy, and Re-evaluation  PLAN FOR NEXT SESSION: Cont working on strength, proprioception, and functional endurance.  do 3 or 6 minute walk test if able. The patient would benefit from a general exercise program. She also has left shoulder dysfunction. We can give her a program that would help the left shoulder but not get it more irritated. Consider endurance exercises such as the bike or nu-step. Monitor Sao2.    Audie Clear III PT, DPT 12/19/22 2:11 PM

## 2022-12-25 ENCOUNTER — Ambulatory Visit (HOSPITAL_BASED_OUTPATIENT_CLINIC_OR_DEPARTMENT_OTHER): Payer: Medicare Other | Admitting: Physical Therapy

## 2022-12-25 DIAGNOSIS — R293 Abnormal posture: Secondary | ICD-10-CM | POA: Diagnosis not present

## 2022-12-25 DIAGNOSIS — M6281 Muscle weakness (generalized): Secondary | ICD-10-CM

## 2022-12-25 DIAGNOSIS — R262 Difficulty in walking, not elsewhere classified: Secondary | ICD-10-CM | POA: Diagnosis not present

## 2022-12-25 DIAGNOSIS — M25512 Pain in left shoulder: Secondary | ICD-10-CM | POA: Diagnosis not present

## 2022-12-25 NOTE — Therapy (Signed)
OUTPATIENT PHYSICAL THERAPY LOWER EXTREMITY TREATMENT   Patient Name: Sonia [name] MRN: [id number] DOB:1940-12-26, 82 y.o., female Today's Date: 12/26/2022  END OF SESSION:  PT End of Session - 12/25/22 1432     Visit Number 12    Number of Visits 20    Date for PT Re-Evaluation 01/22/23    Authorization Type MCR A &B    PT Start Time 1345    PT Stop Time 1428    PT Time Calculation (min) 43 min    Activity Tolerance Patient tolerated treatment well    Behavior During Therapy WFL for tasks assessed/performed                  Past Medical History:  Diagnosis Date   Arthritis    Asthma    daily and prn inhalers; states good control currently   Breast cancer (HCC)    right   CAD S/P percutaneous coronary angioplasty 07/15/2016   COPD (chronic obstructive pulmonary disease) (HCC)    Dyspnea    occ with exertion   Fibromyalgia    GERD (gastroesophageal reflux disease)    no current med.   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   Vitamin D deficiency    Past Surgical History:  Procedure Laterality Date   ABDOMINAL HYSTERECTOMY     partial   APPENDECTOMY     BREAST BIOPSY Left    BREAST CYST EXCISION Left    BREAST EXCISIONAL BIOPSY Left 03/2015   BREAST LUMPECTOMY Right 08/2018   BREAST LUMPECTOMY WITH RADIOACTIVE SEED LOCALIZATION Left 04/07/2015   Procedure: BREAST LUMPECTOMY WITH RADIOACTIVE SEED LOCALIZATION;  Surgeon: Harriette Bouillon, MD;  Location: [facility];  Service: General;  Laterality: Left;   BREAST LUMPECTOMY WITH RADIOACTIVE SEED LOCALIZATION Right 08/13/2018   Procedure: RIGHT BREAST  LUMPECTOMY WITH RADIOACTIVE SEED LOCALIZATION;  Surgeon: Emelia Loron, MD;  Location:  SURGERY CENTER;  Service: General;  Laterality: Right;   COLONOSCOPY     CORONARY STENT INTERVENTION N/A 07/15/2016   Procedure: Coronary Stent Intervention;  Surgeon: Tonny Bollman, MD;  Location[facility] INVASIVE CV LAB;  Service: Cardiovascular;  Laterality: N/A;   ESOPHAGOGASTRODUODENOSCOPY (EGD) WITH PROPOFOL Left 07/25/2016   Procedure: ESOPHAGOGASTRODUODENOSCOPY (EGD) WITH PROPOFOL;  Surgeon: Kerin Salen, MD;  Location: [facility] ENDOSCOPY;  Service: Gastroenterology;  Laterality: Left;   KNEE ARTHROSCOPY Right 12/07/2003   LAPAROSCOPIC SALPINGO OOPHERECTOMY Right 09/08/2003   LEFT HEART CATH N/A 07/15/2016   Procedure: Left Heart Cath;  Surgeon: Tonny Bollman, MD;  Location: [facility] INVASIVE CV LAB;  Service: Cardiovascular;  Laterality: N/A;   LEFT HEART CATH AND CORONARY ANGIOGRAPHY N/A 04/14/2019   Procedure: LEFT HEART CATH AND CORONARY ANGIOGRAPHY;  Surgeon: Kathleene Hazel, MD;  Location: [facility] INVASIVE CV LAB;  Service: Cardiovascular;  Laterality: N/A;   LYSIS OF ADHESION  09/08/2003   NASAL SINUS SURGERY  age 70   RECTAL POLYPECTOMY  10/02/2006   Patient Active Problem List   Diagnosis Date Noted   Atrial tachycardia (HCC) 08/07/2022   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 08/04/2022   Hyponatremia  08/04/2022   Thrombocytopenia (HCC) 08/04/2022   COPD with acute exacerbation (HCC) 03/11/2022   Non-STEMI (non-ST elevated myocardial infarction) (HCC) 03/11/2022   RSV (respiratory syncytial virus pneumonia) 03/11/2022   Acute bronchiolitis due to respiratory syncytial virus (RSV) 03/11/2022   Coronary artery disease of native artery of native heart with stable angina pectoris (HCC)    Asthma, chronic, moderate persistent, uncomplicated 02/03/2019   DOE (dyspnea on exertion) 07/31/2018   Osteopenia 06/04/2018   Carcinoma of upper-outer quadrant of right breast  in female, estrogen receptor positive (HCC) 05/30/2018   GI bleed 07/25/2016   Melena 07/24/2016   Acute renal insufficiency 07/24/2016   CAD S/P percutaneous coronary angioplasty 07/16/2016   NSTEMI (non-ST elevated myocardial infarction) (HCC) 07/13/2016   Genetic testing 06/09/2015   Family history of breast cancer in female 05/10/2015   History of melanoma 05/10/2015   Atypical lobular hyperplasia of left breast 04/26/2015   Vision problem 01/08/2014   Hiatus hernia syndrome 08/01/2012   Palpitations 10/18/2010   Acute chest pain 10/18/2010   Hypothyroidism 10/18/2010   Dyslipidemia 06/07/2009   Upper airway cough syndrome 03/29/2009   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   GERD 12/05/2006    PCP: Mila Palmer, MD  REFERRING PROVIDER: Mila Palmer,   REFERRING DIAG:  Diagnosis  E44.0 (ICD-10-CM) - Moderate protein-calorie malnutrition  J45.909 (ICD-10-CM) - Unspecified asthma, uncomplicated  M35.3 (ICD-10-CM) - Polymyalgia rheumatica  R26.89 (ICD-10-CM) - Other abnormalities of gait and mobility   THERAPY DIAG:  Difficulty in walking, not elsewhere classified  Left shoulder pain, unspecified chronicity  Abnormal posture  Muscle weakness (generalized)  Rationale for Evaluation and Treatment: Rehabilitation  ONSET DATE:  Decreased mobility and endurance since hospitalization but declining even before that   SUBJECTIVE:    The patient reports the weather makes her knees sore.  She reports her breathing is somewhat shaky today.   PERTINENT HISTORY: Polymyalgia, arthritis, asthma ( worse int he morning); MI 3 years prior; headaches mostly on rainy days; Hashimoto thyroiditis. Old knee scope but doing well.Marland Kitchen  PAIN:  Are you having pain? Yes: NPRS scale: 0/10 Pain location: bilateral knees  Pain description: aching  Aggravating factors: sit to stand transfers Relieving factors: standing and walking improves the stiffness  PRECAUTIONS: None  RED  FLAGS: None   WEIGHT BEARING RESTRICTIONS: No  FALLS:  Has patient fallen in last 6 months? No collapsed prior to the hospital  LIVING ENVIRONMENT: 3 steps into the house but has a railing   OCCUPATION: Retired Had a walk in Advertising copywriter put in    PLOF: Independent  PATIENT GOALS:  Strength and general mobility    NEXT MD VISIT:  Nothing scheduled   OBJECTIVE:   DIAGNOSTIC FINDINGS:   CXR: IMPRESSION: No active cardiopulmonary disease.   Stable large hiatal hernia.  PATIENT SURVEYS:  Incorrect set up    LOWER EXTREMITY MMT:  MMT Right eval Left eval Right 10/1 Left 10/1  Hip flexion 17.4 16.6 24.4 23.5  Hip extension      Hip abduction 16.2 18.4 14.9   Pt was tender with pushing 16.2  pt was tender with pushing  Hip adduction      Hip internal rotation      Hip external rotation      Knee flexion      Knee extension Unable to do 2nd to tenderness in her legs Unable to do 2nd to tenderness in her legs     Ankle dorsiflexion  Ankle plantarflexion      Ankle inversion      Ankle eversion       (Blank rows = not tested)  UPPER EXTREMITY ROM:   ROM Right eval Left eval Left 10/1  Shoulder flexion  80 degrees with pain  135 with less pain  Shoulder extension     Shoulder abduction     Shoulder adduction     Shoulder extension     Shoulder internal rotation     Shoulder external rotation     Elbow flexion     Elbow extension     Wrist flexion     Wrist extension     Wrist ulnar deviation     Wrist radial deviation     Wrist pronation     Wrist supination      (Blank rows = not tested)   FUNCTIONAL TESTS:  3 MWT:  Pt had labored breathing and did not complete the test.  PT had pt sit down.  Her O2 was good.  O2:  98%, HR:  107 which decreased to 85 with rest.  Pt used an inhaler when she sat down.  Pt ambulated 345 ft.  Punches 2 pound 2 x 10 R UE Bicep curl 2 x 10 2 pound bilat        TODAY'S TREATMENT:                                                                                                                               10/16 Nu-step 5 min   Hurdle walk 4 laps side ways and forward and back   Step onto air-ex fwd and lateral 2x10 each leg   Airex: Base of support eyes open/eyes closed 2 x 30 seconds  Airex tandem stance 2 x 30 seconds each leg  Seated clamshell red 2 x 15 Seated march red 2 x 15 Seated ball squeeze red 2 x 15 LAQ 2 x 15 no resistance    10/9 Nu-step 5 min   Hurdle walk 4 laps side ways and forward and back   Step onto air-ex fwd and lateral 2x10 each leg   Punch 2 lbs 2x10  Biceps curl 3x12  3lbs    Manual: trigger point release to upper trap   Progress Note:   NuStep 5 minutes level 4 Vitals in sitting after Nustep HR:  84 O2:  96%     Access Code: [email or web address] URL: [email or web address] Date: 10/08/2022 Prepared by: Lorayne Bender  Exercises - Seated Hip Abduction with Resistance  - 1 x daily - 7 x weekly - 3 sets - 10-15 reps - Seated March  - 1 x daily - 7 x weekly - 3 sets - 10-15 reps - Seated Knee Extension with Resistance  - 1 x daily - 7 x weekly - 3 sets - 10-15 reps  PATIENT EDUCATION:  Education details: HEP, symptom management.  Person educated: Patient Education method: Explanation, Demonstration,  Tactile cues, Verbal cues Education comprehension: verbalized understanding, returned demonstration, verbal cues required, tactile cues required, and needs further education  HOME EXERCISE PROGRAM: Access Code: [email or web address] URL: [email or web address] Date: 10/08/2022 Prepared by: Lorayne Bender  Exercises - Seated Hip Abduction with Resistance  - 1 x daily - 7 x weekly - 3 sets - 10-15 reps - Seated March  - 1 x daily - 7 x weekly - 3 sets - 10-15 reps - Seated Knee Extension with Resistance  - 1 x daily - 7 x weekly - 3 sets - 10-15 reps  ASSESSMENT:  CLINICAL IMPRESSION: Despite baseline knee pain the patient tolerated  treatment well.  We focused on instability exercises.  She had mild of shortness of breath with activity.  We also focused on lower extremity exercises.  Will continue to progress as tolerated.  OBJECTIVE IMPAIRMENTS: Abnormal gait, decreased endurance, difficulty walking, decreased ROM, decreased strength, impaired UE functional use, and pain.   ACTIVITY LIMITATIONS: carrying, standing, stairs, and locomotion level  PARTICIPATION LIMITATIONS: meal prep, cleaning, laundry, driving, shopping, community activity, and yard work  PERSONAL FACTORS: 3+ comorbidities: COPD, polymyalgia rheumatica, headahces  are also affecting patient's functional outcome.   REHAB POTENTIAL: Good  CLINICAL DECISION MAKING: Evolving/moderate complexity declining overall functional mobility   EVALUATION COMPLEXITY: Moderate   GOALS: Goals reviewed with patient? Yes  SHORT TERM GOALS: Target date: 11/05/2022   Patient will increase gross bilateral LE strength by 5 lbs  Baseline: Goal status: partially met  2.  Patient will perform 3 or 6 minute walk test  Baseline:  Goal status: GOAL MET  3.  Patient will be independent with basic HEP  Baseline:  Goal status: GOAL MET  4.  Patients active left shoulder flexion will be 110 without pain  Baseline:  Goal status: GOAL MET   LONG TERM GOALS: Target date: 01/22/2023    Patient will stand for 15 minutes without dyspnea and hip pain in order to perform ADL's  Baseline:  Goal status: ONGOING  2.  Patient will ambulate community distances without pain or dyspnea  Baseline:  Goal status: ONGOING  3.  Patient will have a compete exercise program to promote endurance and functional mobility  Baseline:  Goal status: ONGOING   PLAN:  PT FREQUENCY: 1-2x/week  PT DURATION: 5-6 weeks  PLANNED INTERVENTIONS: Therapeutic exercises, Therapeutic activity, Neuromuscular re-education, Balance training, Gait training, Patient/Family education, Self Care,  Joint mobilization, Aquatic Therapy, Dry Needling, Cryotherapy, Moist heat, Taping, Ionotophoresis 4mg /ml Dexamethasone, Manual therapy, and Re-evaluation  PLAN FOR NEXT SESSION: Cont working on strength, proprioception, and functional endurance.  do 3 or 6 minute walk test if able. The patient would benefit from a general exercise program. She also has left shoulder dysfunction. We can give her a program that would help the left shoulder but not get it more irritated. Consider endurance exercises such as the bike or nu-step. Monitor Sao[place].    Audie Clear III PT, DPT 12/26/22 8:22 AM

## 2022-12-26 ENCOUNTER — Encounter (HOSPITAL_BASED_OUTPATIENT_CLINIC_OR_DEPARTMENT_OTHER): Payer: Self-pay | Admitting: Physical Therapy

## 2022-12-27 ENCOUNTER — Ambulatory Visit (HOSPITAL_BASED_OUTPATIENT_CLINIC_OR_DEPARTMENT_OTHER): Payer: Medicare Other | Admitting: Physical Therapy

## 2022-12-27 ENCOUNTER — Encounter (HOSPITAL_BASED_OUTPATIENT_CLINIC_OR_DEPARTMENT_OTHER): Payer: Self-pay | Admitting: Physical Therapy

## 2022-12-27 DIAGNOSIS — M6281 Muscle weakness (generalized): Secondary | ICD-10-CM

## 2022-12-27 DIAGNOSIS — M25512 Pain in left shoulder: Secondary | ICD-10-CM

## 2022-12-27 DIAGNOSIS — R293 Abnormal posture: Secondary | ICD-10-CM

## 2022-12-27 DIAGNOSIS — R262 Difficulty in walking, not elsewhere classified: Secondary | ICD-10-CM | POA: Diagnosis not present

## 2022-12-27 NOTE — Therapy (Signed)
We do need on Friday OUTPATIENT PHYSICAL THERAPY LOWER EXTREMITY TREATMENT   Patient Name: Sonia [name] MRN: [id number] DOB:[birth year], 82 y.o., female Today's Date: 12/27/2022  END OF SESSION:  PT End of Session - 12/27/22 1351     Visit Number 13    Number of Visits 20    Date for PT Re-Evaluation 01/22/23    Authorization Type MCR A &B    PT Start Time 1345    PT Stop Time 1427    PT Time Calculation (min) 42 min    Activity Tolerance Patient tolerated treatment well    Behavior During Therapy WFL for tasks assessed/performed                  Past Medical History:  Diagnosis Date   Arthritis    Asthma    daily and prn inhalers; states good control currently   Breast cancer (HCC)    right   CAD S/P percutaneous coronary angioplasty 07/15/2016   COPD (chronic obstructive pulmonary disease) (HCC)    Dyspnea    occ with exertion   Fibromyalgia    GERD (gastroesophageal reflux disease)    no current med.   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   Vitamin D deficiency    Past Surgical History:  Procedure Laterality Date   ABDOMINAL HYSTERECTOMY     partial   APPENDECTOMY     BREAST BIOPSY Left    BREAST CYST EXCISION Left    BREAST EXCISIONAL BIOPSY Left 03/2015   BREAST LUMPECTOMY Right 08/2018   BREAST LUMPECTOMY WITH RADIOACTIVE SEED LOCALIZATION Left 04/07/2015   Procedure: BREAST LUMPECTOMY WITH RADIOACTIVE SEED LOCALIZATION;  Surgeon: Harriette Bouillon, MD;  Location: [facility];  Service: General;  Laterality: Left;   BREAST LUMPECTOMY WITH RADIOACTIVE SEED LOCALIZATION Right 08/13/2018    Procedure: RIGHT BREAST LUMPECTOMY WITH RADIOACTIVE SEED LOCALIZATION;  Surgeon: Emelia Loron, MD;  Location: [facility];  Service: General;  Laterality: Right;   COLONOSCOPY     CORONARY STENT INTERVENTION N/A 07/15/2016   Procedure: Coronary Stent Intervention;  Surgeon: Tonny Bollman, MD;  Location: [facility] INVAS[facility] CV LAB;  Service: Cardiovascular;  Laterality: N/A;   ESOPHAGOGASTRODUODENOSCOPY (EGD) WITH PROPOFOL Left 07/25/2016   Procedure: ESOPHAGOGASTRODUODENOSCOPY (EGD) WITH PROPOFOL;  Surgeon: Kerin Salen, MD;  Location: [facility] ENDOSCOPY;  Service: Gastroenterology;  Laterality: Left;   KNEE ARTHROSCOPY Right 12/07/2003   LAPAROSCOPIC SALPINGO OOPHERECTOMY Right 09/08/2003   LEFT HEART CATH N/A 07/15/2016   Procedure: Left Heart Cath;  Surgeon: Tonny Bollman, MD;  Location: [facility]VAS[facility] CV LAB;  Service: Cardiovascular;  Laterality: N/A;   LEFT HEART CATH AND CORONARY ANGIOGRAPHY N/A 04/14/2019   Procedure: LEFT HEART CATH AND CORONARY ANGIOGRAPHY;  Surgeon: Kathleene Hazel, MD;  Location: [facility] INVASIVE CV LAB;  Service: Cardiovascular;  Laterality: N/A;   LYSIS OF ADHESION  09/08/2003   NASAL SINUS SURGERY  age 41   RECTAL POLYPECTOMY  10/02/2006   Patient Active Problem List   Diagnosis Date Noted   Atrial tachycardia (HCC) 08/07/2022   Moderate persistent asthma dependent on systemic  steroids 08/04/2022   Hyponatremia 08/04/2022   Thrombocytopenia (HCC) 08/04/2022   COPD with acute exacerbation (HCC) 03/11/2022   Non-STEMI (non-ST elevated myocardial infarction) (HCC) 03/11/2022   RSV (respiratory syncytial virus pneumonia) 03/11/2022   Acute bronchiolitis due to respiratory syncytial virus (RSV) 03/11/2022   Coronary artery disease of native artery of native heart with stable angina pectoris (HCC)    Asthma, chronic, moderate persistent, uncomplicated 02/03/2019   DOE (dyspnea on exertion) 07/31/2018   Osteopenia 06/04/2018   Carcinoma of upper-outer  quadrant of right breast in female, estrogen receptor positive (HCC) 05/30/2018   GI bleed 07/25/2016   Melena 07/24/2016   Acute renal insufficiency 07/24/2016   CAD S/P percutaneous coronary angioplasty 07/16/2016   NSTEMI (non-ST elevated myocardial infarction) (HCC) 07/13/2016   Genetic testing 06/09/2015   Family history of breast cancer in female 05/10/2015   History of melanoma 05/10/2015   Atypical lobular hyperplasia of left breast 04/26/2015   Vision problem 01/08/2014   Hiatus hernia syndrome 08/01/2012   Palpitations 10/18/2010   Acute chest pain 10/18/2010   Hypothyroidism 10/18/2010   Dyslipidemia 06/07/2009   Upper airway cough syndrome 03/29/2009   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   GERD 12/05/2006    PCP: Mila Palmer, MD  REFERRING PROVIDER: Mila Palmer,   REFERRING DIAG:  Diagnosis  E44.0 (ICD-10-CM) - Moderate protein-calorie malnutrition  J45.909 (ICD-10-CM) - Unspecified asthma, uncomplicated  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  She is have mild pain in both knees.   PERTINENT HISTORY: Polymyalgia, arthritis, asthma ( worse int he morning); MI 3 years prior; headaches mostly on rainy days; Hashimoto thyroiditis. Old knee scope but doing well.Marland Kitchen  PAIN:  Are you having pain? Yes: NPRS scale: 2 /10 Pain location: bilateral knees  Pain description: aching  Aggravating factors: sit to stand transfers Relieving factors: standing and walking improves the stiffness  PRECAUTIONS: None  RED  FLAGS: None   WEIGHT BEARING RESTRICTIONS: No  FALLS:  Has patient fallen in last 6 months? No collapsed prior to the hospital  LIVING ENVIRONMENT: 3 steps into the house but has a railing   OCCUPATION: Retired Had a walk in Advertising copywriter put in    PLOF: Independent  PATIENT GOALS:  Strength and general mobility    NEXT MD VISIT:  Nothing scheduled   OBJECTIVE:   DIAGNOSTIC FINDINGS:   CXR: IMPRESSION: No active cardiopulmonary disease.   Stable large hiatal hernia.  PATIENT SURVEYS:  Incorrect set up    LOWER EXTREMITY MMT:  MMT Right eval Left eval Right 10/1 Left 10/1  Hip flexion 17.4 16.6 24.4 23.5  Hip extension      Hip abduction 16.2 18.4 14.9   Pt was tender with pushing 16.2  pt was tender with pushing  Hip adduction      Hip internal rotation      Hip external rotation      Knee flexion      Knee extension Unable to do 2nd to tenderness in her legs Unable to do 2nd to tenderness in her legs     Ankle dorsiflexion  Ankle plantarflexion      Ankle inversion      Ankle eversion       (Blank rows = not tested)  UPPER EXTREMITY ROM:   ROM Right eval Left eval Left 10/1  Shoulder flexion  80 degrees with pain  135 with less pain  Shoulder extension     Shoulder abduction     Shoulder adduction     Shoulder extension     Shoulder internal rotation     Shoulder external rotation     Elbow flexion     Elbow extension     Wrist flexion     Wrist extension     Wrist ulnar deviation     Wrist radial deviation     Wrist pronation     Wrist supination      (Blank rows = not tested)   FUNCTIONAL TESTS:  3 MWT:  Pt had labored breathing and did not complete the test.  PT had pt sit down.  Her O2 was good.  O2:  98%, HR:  107 which decreased to 85 with rest.  Pt used an inhaler when she sat down.  Pt ambulated 345 ft.  Punches 2 pound 2 x 10 R UE Bicep curl 2 x 10 2 pound bilat        TODAY'S TREATMENT:                                                                                                                               10/17 NuStep 5  LF row 15 lbs 3x10  Cybex leg press 3x10 50 lbs  LF tricpes press down 3x10 20 lbs   LAQ red band 3x10  Hip adduction with ball 3x10  Hip abduction red 3x15   10/15 Nu-step 5 min   Hurdle walk 4 laps side ways and forward and back   Step onto air-ex fwd and lateral 2x10 each leg   Airex: Base of support eyes open/eyes closed 2 x 30 seconds  Airex tandem stance 2 x 30 seconds each leg  Seated clamshell red 2 x 15 Seated march red 2 x 15 Seated ball squeeze red 2 x 15 LAQ 2 x 15 no resistance    10/9 Nu-step 5 min   Hurdle walk 4 laps side ways and forward and back   Step onto air-ex fwd and lateral 2x10 each leg   Punch 2 lbs 2x10  Biceps curl 3x12  3lbs    Manual: trigger point release to upper trap   Progress Note:   NuStep 5 minutes level 4 Vitals in sitting after Nustep HR:  84 O2:  96%     Access Code: [email or web address] URL: [email or web address] Date: 10/08/2022 Prepared by: Lorayne Bender  Exercises - Seated Hip Abduction with Resistance  - 1 x daily - 7 x weekly - 3 sets - 10-15 reps - Seated March  - 1 x daily - 7 x weekly -  3 sets - 10-15 reps - Seated Knee Extension with Resistance  - 1 x daily - 7 x weekly - 3 sets - 10-15 reps  PATIENT EDUCATION:  Education details: HEP, symptom management.  Person educated: Patient Education method: Explanation, Demonstration, Tactile cues, Verbal cues Education comprehension: verbalized understanding, returned demonstration, verbal cues required, tactile cues required, and needs further education  HOME EXERCISE PROGRAM: Access Code: [email or web address] URL: [email or web address] Date: 10/08/2022 Prepared by: Lorayne Bender  Exercises - Seated Hip Abduction with Resistance  - 1 x daily - 7 x weekly - 3 sets - 10-15 reps - Seated March  - 1 x daily - 7 x weekly - 3 sets - 10-15  reps - Seated Knee Extension with Resistance  - 1 x daily - 7 x weekly - 3 sets - 10-15 reps  ASSESSMENT:  CLINICAL IMPRESSION: The patient tolerated treatment well. She had very little pain with gym exercises. We focused more on upper body today. She reported a challeng but no pain and not significant dyspnea. We will continue to work on strength and stability exercises as tolerated.   OBJECTIVE IMPAIRMENTS: Abnormal gait, decreased endurance, difficulty walking, decreased ROM, decreased strength, impaired UE functional use, and pain.   ACTIVITY LIMITATIONS: carrying, standing, stairs, and locomotion level  PARTICIPATION LIMITATIONS: meal prep, cleaning, laundry, driving, shopping, community activity, and yard work  PERSONAL FACTORS: 3+ comorbidities: COPD, polymyalgia rheumatica, headahces  are also affecting patient's functional outcome.   REHAB POTENTIAL: Good  CLINICAL DECISION MAKING: Evolving/moderate complexity declining overall functional mobility   EVALUATION COMPLEXITY: Moderate   GOALS: Goals reviewed with patient? Yes  SHORT TERM GOALS: Target date: 11/05/2022   Patient will increase gross bilateral LE strength by 5 lbs  Baseline: Goal status: partially met  2.  Patient will perform 3 or 6 minute walk test  Baseline:  Goal status: GOAL MET  3.  Patient will be independent with basic HEP  Baseline:  Goal status: GOAL MET  4.  Patients active left shoulder flexion will be 110 without pain  Baseline:  Goal status: GOAL MET   LONG TERM GOALS: Target date: 01/22/2023    Patient will stand for 15 minutes without dyspnea and hip pain in order to perform ADL's  Baseline:  Goal status: ONGOING  2.  Patient will ambulate community distances without pain or dyspnea  Baseline:  Goal status: ONGOING  3.  Patient will have a compete exercise program to promote endurance and functional mobility  Baseline:  Goal status: ONGOING   PLAN:  PT FREQUENCY:  1-2x/week  PT DURATION: 5-6 weeks  PLANNED INTERVENTIONS: Therapeutic exercises, Therapeutic activity, Neuromuscular re-education, Balance training, Gait training, Patient/Family education, Self Care, Joint mobilization, Aquatic Therapy, Dry Needling, Cryotherapy, Moist heat, Taping, Ionotophoresis 4mg /ml Dexamethasone, Manual therapy, and Re-evaluation  PLAN FOR NEXT SESSION: Cont working on strength, proprioception, and functional endurance.  do 3 or 6 minute walk test if able. The patient would benefit from a general exercise program. She also has left shoulder dysfunction. We can give her a program that would help the left shoulder but not get it more irritated. Consider endurance exercises such as the bike or nu-step. Monitor Sao[place].    Audie Clear III PT, DPT 12/27/22 2:24 PM

## 2022-12-28 ENCOUNTER — Encounter (HOSPITAL_BASED_OUTPATIENT_CLINIC_OR_DEPARTMENT_OTHER): Payer: Self-pay | Admitting: Physical Therapy

## 2023-01-01 ENCOUNTER — Encounter (HOSPITAL_BASED_OUTPATIENT_CLINIC_OR_DEPARTMENT_OTHER): Payer: Self-pay | Admitting: Physical Therapy

## 2023-01-01 ENCOUNTER — Ambulatory Visit (HOSPITAL_BASED_OUTPATIENT_CLINIC_OR_DEPARTMENT_OTHER): Payer: Medicare Other | Admitting: Physical Therapy

## 2023-01-01 DIAGNOSIS — R262 Difficulty in walking, not elsewhere classified: Secondary | ICD-10-CM

## 2023-01-01 DIAGNOSIS — M6281 Muscle weakness (generalized): Secondary | ICD-10-CM | POA: Diagnosis not present

## 2023-01-01 DIAGNOSIS — R293 Abnormal posture: Secondary | ICD-10-CM

## 2023-01-01 DIAGNOSIS — M25512 Pain in left shoulder: Secondary | ICD-10-CM

## 2023-01-01 NOTE — Therapy (Signed)
We do need on Friday OUTPATIENT PHYSICAL THERAPY LOWER EXTREMITY TREATMENT   Patient Name: Sonia [name] MRN: [id number] DOB:1940-12-18, 82 y.o., female Today's Date: 01/01/2023  END OF SESSION:  PT End of Session - 01/01/23 1346     Visit Number 14    Number of Visits 20    Date for PT Re-Evaluation 01/22/23    Authorization Type MCR A &B    PT Start Time 1343    PT Stop Time 1428    PT Time Calculation (min) 45 min    Activity Tolerance Patient tolerated treatment well    Behavior During Therapy WFL for tasks assessed/performed                  Past Medical History:  Diagnosis Date   Arthritis    Asthma    daily and prn inhalers; states good control currently   Breast cancer (HCC)    right   CAD S/P percutaneous coronary angioplasty 07/15/2016   COPD (chronic obstructive pulmonary disease) (HCC)    Dyspnea    occ with exertion   Fibromyalgia    GERD (gastroesophageal reflux disease)    no current med.   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   Vitamin D deficiency    Past Surgical History:  Procedure Laterality Date   ABDOMINAL HYSTERECTOMY     partial   APPENDECTOMY     BREAST BIOPSY Left    BREAST CYST EXCISION Left    BREAST EXCISIONAL BIOPSY Left 03/2015   BREAST LUMPECTOMY Right 08/2018   BREAST LUMPECTOMY WITH RADIOACTIVE SEED LOCALIZATION Left 04/07/2015   Procedure: BREAST LUMPECTOMY WITH RADIOACTIVE SEED LOCALIZATION;  Surgeon: Harriette Bouillon, MD;  Location: [facility];  Service: General;  Laterality: Left;   BREAST LUMPECTOMY WITH RADIOACTIVE SEED LOCALIZATION Right 08/13/2018    Procedure: RIGHT BREAST LUMPECTOMY WITH RADIOACTIVE SEED LOCALIZATION;  Surgeon: Emelia Loron, MD;  Location: [facility];  Service: General;  Laterality: Right;   COLONOSCOPY     CORONARY STENT INTERVENTION N/A 07/15/2016   Procedure: Coronary Stent Intervention;  Surgeon: Tonny Bollman, MD;  Location: [facility] INVASIVE CV LAB;  Service: Cardiovascular;  Laterality: N/A;   ESOPHAGOGASTRODUODENOSCOPY (EGD) WITH PROPOFOL Left 07/25/2016   Procedure: ESOPHAGOGASTRODUODENOSCOPY (EGD) WITH PROPOFOL;  Surgeon: Kerin Salen, MD;  Location: [facility] ENDOSCOPY;  Service: Gastroenterology;  Laterality: Left;   KNEE ARTHROSCOPY Right 12/07/2003   LAPAROSCOPIC SALPINGO OOPHERECTOMY Right 09/08/2003   LEFT HEART CATH N/A 07/15/2016   Procedure: Left Heart Cath;  Surgeon: Tonny Bollman, MD;  Location: [facility] INVASIVE CV LAB;  Service: Cardiovascular;  Laterality: N/A;   LEFT HEART CATH AND CORONARY ANGIOGRAPHY N/A 04/14/2019   Procedure: LEFT HEART CATH AND CORONARY ANGIOGRAPHY;  Surgeon: Kathleene Hazel, MD;  Location: [facility] INVASIVE CV LAB;  Service: Cardiovascular;  Laterality: N/A;   LYSIS OF ADHESION  09/08/2003   NASAL SINUS SURGERY  age 64   RECTAL POLYPECTOMY  10/02/2006   Patient Active Problem List   Diagnosis Date Noted   Atrial tachycardia (HCC) 08/07/2022   Moderate persistent asthma dependent on systemic  steroids 08/04/2022   Hyponatremia 08/04/2022   Thrombocytopenia (HCC) 08/04/2022   COPD with acute exacerbation (HCC) 03/11/2022   Non-STEMI (non-ST elevated myocardial infarction) (HCC) 03/11/2022   RSV (respiratory syncytial virus pneumonia) 03/11/2022   Acute bronchiolitis due to respiratory syncytial virus (RSV) 03/11/2022   Coronary artery disease of native artery of native heart with stable angina pectoris (HCC)    Asthma, chronic, moderate persistent, uncomplicated 02/03/2019   DOE (dyspnea on exertion) 07/31/2018   Osteopenia 06/04/2018   Carcinoma of upper-outer  quadrant of right breast in female, estrogen receptor positive (HCC) 05/30/2018   GI bleed 07/25/2016   Melena 07/24/2016   Acute renal insufficiency 07/24/2016   CAD S/P percutaneous coronary angioplasty 07/16/2016   NSTEMI (non-ST elevated myocardial infarction) (HCC) 07/13/2016   Genetic testing 06/09/2015   Family history of breast cancer in female 05/10/2015   History of melanoma 05/10/2015   Atypical lobular hyperplasia of left breast 04/26/2015   Vision problem 01/08/2014   Hiatus hernia syndrome 08/01/2012   Palpitations 10/18/2010   Acute chest pain 10/18/2010   Hypothyroidism 10/18/2010   Dyslipidemia 06/07/2009   Upper airway cough syndrome 03/29/2009   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   GERD 12/05/2006    PCP: Mila Palmer, MD  REFERRING PROVIDER: Mila Palmer,   REFERRING DIAG:  Diagnosis  E44.0 (ICD-10-CM) - Moderate protein-calorie malnutrition  J45.909 (ICD-10-CM) - Unspecified asthma, uncomplicated  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 She has tried her inhalers but is still having some difficulty. She did well after the last exercise session.   PERTINENT HISTORY: Polymyalgia, arthritis, asthma ( worse int he morning); MI 3 years prior; headaches mostly on rainy days; Hashimoto thyroiditis. Old knee scope but doing well.Marland Kitchen  PAIN:  Are you having pain? Yes: NPRS scale: 2 /10 Pain location: bilateral knees  Pain description: aching  Aggravating factors: sit to stand transfers Relieving  factors: standing and walking improves the stiffness  PRECAUTIONS: None  RED FLAGS: None   WEIGHT BEARING RESTRICTIONS: No  FALLS:  Has patient fallen in last 6 months? No collapsed prior to the hospital  LIVING ENVIRONMENT: 3 steps into the house but has a railing   OCCUPATION: Retired Had a walk in Advertising copywriter put in    PLOF: Independent  PATIENT GOALS:  Strength and general mobility    NEXT MD VISIT:  Nothing scheduled   OBJECTIVE:   DIAGNOSTIC FINDINGS:   CXR: IMPRESSION: No active cardiopulmonary disease.   Stable large hiatal hernia.  PATIENT SURVEYS:  Incorrect set up    LOWER EXTREMITY MMT:  MMT Right eval Left eval Right 10/1 Left 10/1  Hip flexion 17.4 16.6 24.4 23.5  Hip extension      Hip abduction 16.2 18.4 14.9   Pt was tender with pushing 16.2  pt was tender with pushing  Hip adduction      Hip internal rotation      Hip external rotation      Knee flexion      Knee extension Unable to do 2nd to tenderness in her legs Unable to do 2nd  to tenderness in her legs     Ankle dorsiflexion      Ankle plantarflexion      Ankle inversion      Ankle eversion       (Blank rows = not tested)  UPPER EXTREMITY ROM:   ROM Right eval Left eval Left 10/1  Shoulder flexion  80 degrees with pain  135 with less pain  Shoulder extension     Shoulder abduction     Shoulder adduction     Shoulder extension     Shoulder internal rotation     Shoulder external rotation     Elbow flexion     Elbow extension     Wrist flexion     Wrist extension     Wrist ulnar deviation     Wrist radial deviation     Wrist pronation     Wrist supination      (Blank rows = not tested)   FUNCTIONAL TESTS:  3 MWT:  Pt had labored breathing and did not complete the test.  PT had pt sit down.  Her O2 was good.  O2:  98%, HR:  107 which decreased to 85 with rest.  Pt used an inhaler when she sat down.  Pt ambulated 345 ft.  Punches 2 pound 2 x 10 R UE Bicep curl 2 x  10 2 pound bilat        TODAY'S TREATMENT:                                                                                                                              10/23 NuStep 5  Hip abduction machine 3x15 45 lbs  Cable pull down [employer] 3x12  Cybex leg press 3x10 50 lbs    Air-ex step on x20 each leg  Heel/toe x20  Standing march x20 each leg      10/17 NuStep 5  LF row 15 lbs 3x10  Cybex leg press 3x10 50 lbs  LF tricpes press down 3x10 20 lbs   LAQ red band 3x10  Hip adduction with ball 3x10  Hip abduction red 3x15   10/15 Nu-step 5 min   Hurdle walk 4 laps side ways and forward and back   Step onto air-ex fwd and lateral 2x10 each leg   Airex: Base of support eyes open/eyes closed 2 x 30 seconds  Airex tandem stance 2 x 30 seconds each leg  Seated clamshell red 2 x 15 Seated march red 2 x 15 Seated ball squeeze red 2 x 15 LAQ 2 x 15 no resistance    10/9 Nu-step 5 min   Hurdle walk 4 laps side ways and forward and back   Step onto air-ex fwd and lateral 2x10 each leg   Punch 2 lbs 2x10  Biceps curl 3x12  3lbs    Manual: trigger point release to upper trap   Progress Note:   NuStep 5  minutes level 4 Vitals in sitting after Nustep HR:  84 O2:  96%     Access Code: [email or web address] URL: [email or web address] Date: 10/08/2022 Prepared by: Lorayne Bender  Exercises - Seated Hip Abduction with Resistance  - 1 x daily - 7 x weekly - 3 sets - 10-15 reps - Seated March  - 1 x daily - 7 x weekly - 3 sets - 10-15 reps - Seated Knee Extension with Resistance  - 1 x daily - 7 x weekly - 3 sets - 10-15 reps  PATIENT EDUCATION:  Education details: HEP, symptom management.  Person educated: Patient Education method: Explanation, Demonstration, Tactile cues, Verbal cues Education comprehension: verbalized understanding, returned demonstration, verbal cues required, tactile cues required, and needs further  education  HOME EXERCISE PROGRAM: Access Code: [email or web address] URL: [email or web address] Date: 10/08/2022 Prepared by: Lorayne Bender  Exercises - Seated Hip Abduction with Resistance  - 1 x daily - 7 x weekly - 3 sets - 10-15 reps - Seated March  - 1 x daily - 7 x weekly - 3 sets - 10-15 reps - Seated Knee Extension with Resistance  - 1 x daily - 7 x weekly - 3 sets - 10-15 reps  ASSESSMENT:  CLINICAL IMPRESSION: The patient is making good progress. Se is tolerating gym exercises well. She feels like they help. She had minimal dysnea despite baseline dyspnea .She did well grading her weihts today. We talked about RPE for grading her exercises. We will continue to progress as tolerated.    OBJECTIVE IMPAIRMENTS: Abnormal gait, decreased endurance, difficulty walking, decreased ROM, decreased strength, impaired UE functional use, and pain.   ACTIVITY LIMITATIONS: carrying, standing, stairs, and locomotion level  PARTICIPATION LIMITATIONS: meal prep, cleaning, laundry, driving, shopping, community activity, and yard work  PERSONAL FACTORS: 3+ comorbidities: COPD, polymyalgia rheumatica, headahces  are also affecting patient's functional outcome.   REHAB POTENTIAL: Good  CLINICAL DECISION MAKING: Evolving/moderate complexity declining overall functional mobility   EVALUATION COMPLEXITY: Moderate   GOALS: Goals reviewed with patient? Yes  SHORT TERM GOALS: Target date: 11/05/2022   Patient will increase gross bilateral LE strength by 5 lbs  Baseline: Goal status: partially met  2.  Patient will perform 3 or 6 minute walk test  Baseline:  Goal status: GOAL MET  3.  Patient will be independent with basic HEP  Baseline:  Goal status: GOAL MET  4.  Patients active left shoulder flexion will be 110 without pain  Baseline:  Goal status: GOAL MET   LONG TERM GOALS: Target date: 01/22/2023    Patient will stand for 15 minutes without dyspnea and hip pain in  order to perform ADL's  Baseline:  Goal status: ONGOING  2.  Patient will ambulate community distances without pain or dyspnea  Baseline:  Goal status: ONGOING  3.  Patient will have a compete exercise program to promote endurance and functional mobility  Baseline:  Goal status: ONGOING   PLAN:  PT FREQUENCY: 1-2x/week  PT DURATION: 5-6 weeks  PLANNED INTERVENTIONS: Therapeutic exercises, Therapeutic activity, Neuromuscular re-education, Balance training, Gait training, Patient/Family education, Self Care, Joint mobilization, Aquatic Therapy, Dry Needling, Cryotherapy, Moist heat, Taping, Ionotophoresis 4mg /ml Dexamethasone, Manual therapy, and Re-evaluation  PLAN FOR NEXT SESSION: Cont working on strength, proprioception, and functional endurance.  do 3 or 6 minute walk test if able. The patient would benefit from a general exercise program. She also has left shoulder dysfunction. We can give her a program that would help the left shoulder but not get  it more irritated. Consider endurance exercises such as the bike or nu-step. Monitor Sao[place].    Audie Clear III PT, DPT 01/01/23 2:08 PM

## 2023-01-03 ENCOUNTER — Encounter (HOSPITAL_BASED_OUTPATIENT_CLINIC_OR_DEPARTMENT_OTHER): Payer: Self-pay | Admitting: Physical Therapy

## 2023-01-03 ENCOUNTER — Ambulatory Visit (HOSPITAL_BASED_OUTPATIENT_CLINIC_OR_DEPARTMENT_OTHER): Payer: Medicare Other | Admitting: Physical Therapy

## 2023-01-03 DIAGNOSIS — R293 Abnormal posture: Secondary | ICD-10-CM | POA: Diagnosis not present

## 2023-01-03 DIAGNOSIS — M6281 Muscle weakness (generalized): Secondary | ICD-10-CM

## 2023-01-03 DIAGNOSIS — R262 Difficulty in walking, not elsewhere classified: Secondary | ICD-10-CM | POA: Diagnosis not present

## 2023-01-03 DIAGNOSIS — M25512 Pain in left shoulder: Secondary | ICD-10-CM | POA: Diagnosis not present

## 2023-01-03 NOTE — Therapy (Signed)
We do need on Friday OUTPATIENT PHYSICAL THERAPY LOWER EXTREMITY TREATMENT   Patient Name: Sonia [name] MRN: [id number] DOB:1940-07-07, 82 y.o., female Today's Date: 01/03/2023  END OF SESSION:  PT End of Session - 01/03/23 1356     Visit Number 15    Number of Visits 20    Date for PT Re-Evaluation 01/22/23    Authorization Type MCR A &B    PT Start Time 1345    PT Stop Time 1428    PT Time Calculation (min) 43 min    Activity Tolerance Patient tolerated treatment well    Behavior During Therapy WFL for tasks assessed/performed                   Past Medical History:  Diagnosis Date   Arthritis    Asthma    daily and prn inhalers; states good control currently   Breast cancer (HCC)    right   CAD S/P percutaneous coronary angioplasty 07/15/2016   COPD (chronic obstructive pulmonary disease) (HCC)    Dyspnea    occ with exertion   Fibromyalgia    GERD (gastroesophageal reflux disease)    no current med.   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   Vitamin D deficiency    Past Surgical History:  Procedure Laterality Date   ABDOMINAL HYSTERECTOMY     partial   APPENDECTOMY     BREAST BIOPSY Left    BREAST CYST EXCISION Left    BREAST EXCISIONAL BIOPSY Left 03/2015   BREAST LUMPECTOMY Right 08/2018   BREAST LUMPECTOMY WITH RADIOACTIVE SEED LOCALIZATION Left 04/07/2015   Procedure: BREAST LUMPECTOMY WITH RADIOACTIVE SEED LOCALIZATION;  Surgeon: Harriette Bouillon, MD;  Location: [facility];  Service: General;  Laterality: Left;   BREAST LUMPECTOMY WITH RADIOACTIVE SEED LOCALIZATION Right 08/13/2018    Procedure: RIGHT BREAST LUMPECTOMY WITH RADIOACTIVE SEED LOCALIZATION;  Surgeon: Emelia Loron, MD;  Location: [facility];  Service: General;  Laterality: Right;   COLONOSCOPY     CORONARY STENT INTERVENTION N/A 07/15/2016   Procedure: Coronary Stent Intervention;  Surgeon: Tonny Bollman, MD;  Location: [facility] INVASIVE CV LAB;  Service: Cardiovascular;  Laterality: N/A;   ESOPHAGOGASTRODUODENOSCOPY (EGD) WITH PROPOFOL Left 07/25/2016   Procedure: ESOPHAGOGASTRODUODENOSCOPY (EGD) WITH PROPOFOL;  Surgeon: Kerin Salen, MD;  Location: [facility] ENDOSCOPY;  Service: Gastroenterology;  Laterality: Left;   KNEE ARTHROSCOPY Right 12/07/2003   LAPAROSCOPIC SALPINGO OOPHERECTOMY Right 09/08/2003   LEFT HEART CATH N/A 07/15/2016   Procedure: Left Heart Cath;  Surgeon: Tonny Bollman, MD;  Location: [facility] INVASIVE CV LAB;  Service: Cardiovascular;  Laterality: N/A;   LEFT HEART CATH AND CORONARY ANGIOGRAPHY N/A 04/14/2019   Procedure: LEFT HEART CATH AND CORONARY ANGIOGRAPHY;  Surgeon: Kathleene Hazel, MD;  Location: [facility] INVASIVE CV LAB;  Service: Cardiovascular;  Laterality: N/A;   LYSIS OF ADHESION  09/08/2003   NASAL SINUS SURGERY  age 5   RECTAL POLYPECTOMY  10/02/2006   Patient Active Problem List   Diagnosis Date Noted   Atrial tachycardia (HCC) 08/07/2022   Moderate persistent asthma dependent on  systemic steroids 08/04/2022   Hyponatremia 08/04/2022   Thrombocytopenia (HCC) 08/04/2022   COPD with acute exacerbation (HCC) 03/11/2022   Non-STEMI (non-ST elevated myocardial infarction) (HCC) 03/11/2022   RSV (respiratory syncytial virus pneumonia) 03/11/2022   Acute bronchiolitis due to respiratory syncytial virus (RSV) 03/11/2022   Coronary artery disease of native artery of native heart with stable angina pectoris (HCC)    Asthma, chronic, moderate persistent, uncomplicated 02/03/2019   DOE (dyspnea on exertion) 07/31/2018   Osteopenia 06/04/2018   Carcinoma of upper-outer  quadrant of right breast in female, estrogen receptor positive (HCC) 05/30/2018   GI bleed 07/25/2016   Melena 07/24/2016   Acute renal insufficiency 07/24/2016   CAD S/P percutaneous coronary angioplasty 07/16/2016   NSTEMI (non-ST elevated myocardial infarction) (HCC) 07/13/2016   Genetic testing 06/09/2015   Family history of breast cancer in female 05/10/2015   History of melanoma 05/10/2015   Atypical lobular hyperplasia of left breast 04/26/2015   Vision problem 01/08/2014   Hiatus hernia syndrome 08/01/2012   Palpitations 10/18/2010   Acute chest pain 10/18/2010   Hypothyroidism 10/18/2010   Dyslipidemia 06/07/2009   Upper airway cough syndrome 03/29/2009   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   GERD 12/05/2006    PCP: Mila Palmer, MD  REFERRING PROVIDER: Mila Palmer,   REFERRING DIAG:  Diagnosis  E44.0 (ICD-10-CM) - Moderate protein-calorie malnutrition  J45.909 (ICD-10-CM) - Unspecified asthma, uncomplicated  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 She has tried her inhalers but is still having some difficulty. She did well after the last exercise session.   PERTINENT HISTORY: Polymyalgia, arthritis, asthma ( worse int he morning); MI 3 years prior; headaches mostly on rainy days; Hashimoto thyroiditis. Old knee scope but doing well.Marland Kitchen  PAIN:  Are you having pain? Yes: NPRS scale: 2 /10 Pain location: bilateral knees  Pain description: aching  Aggravating factors: sit to stand transfers Relieving  factors: standing and walking improves the stiffness  PRECAUTIONS: None  RED FLAGS: None   WEIGHT BEARING RESTRICTIONS: No  FALLS:  Has patient fallen in last 6 months? No collapsed prior to the hospital  LIVING ENVIRONMENT: 3 steps into the house but has a railing   OCCUPATION: Retired Had a walk in Advertising copywriter put in    PLOF: Independent  PATIENT GOALS:  Strength and general mobility    NEXT MD VISIT:  Nothing scheduled   OBJECTIVE:   DIAGNOSTIC FINDINGS:   CXR: IMPRESSION: No active cardiopulmonary disease.   Stable large hiatal hernia.  PATIENT SURVEYS:  Incorrect set up    LOWER EXTREMITY MMT:  MMT Right eval Left eval Right 10/1 Left 10/1  Hip flexion 17.4 16.6 24.4 23.5  Hip extension      Hip abduction 16.2 18.4 14.9   Pt was tender with pushing 16.2  pt was tender with pushing  Hip adduction      Hip internal rotation      Hip external rotation      Knee flexion      Knee extension Unable to do 2nd to tenderness in her legs Unable to do  2nd to tenderness in her legs     Ankle dorsiflexion      Ankle plantarflexion      Ankle inversion      Ankle eversion       (Blank rows = not tested)  UPPER EXTREMITY ROM:   ROM Right eval Left eval Left 10/1  Shoulder flexion  80 degrees with pain  135 with less pain  Shoulder extension     Shoulder abduction     Shoulder adduction     Shoulder extension     Shoulder internal rotation     Shoulder external rotation     Elbow flexion     Elbow extension     Wrist flexion     Wrist extension     Wrist ulnar deviation     Wrist radial deviation     Wrist pronation     Wrist supination      (Blank rows = not tested)   FUNCTIONAL TESTS:  3 MWT:  Pt had labored breathing and did not complete the test.  PT had pt sit down.  Her O2 was good.  O2:  98%, HR:  107 which decreased to 85 with rest.  Pt used an inhaler when she sat down.  Pt ambulated 345 ft.  Punches 2 pound 2 x 10 R UE Bicep curl 2 x  10 2 pound bilat        TODAY'S TREATMENT:                                                                                                                              10/24 Nu-step 5 min   Cybex row 3x10 20 lbs  LF triceps press down 3x15  LF hip abduction 3x15  Cybex leg press 3z15 50 lbns   Nu-step 5 min L4    10/22 NuStep 5  Hip abduction machine 3x15 45 lbs  Cable pull down [employer] 3x12  Cybex leg press 3x10 50 lbs    Air-ex step on x20 each leg  Heel/toe x20  Standing march x20 each leg      10/17 NuStep 5  LF row 15 lbs 3x10  Cybex leg press 3x10 50 lbs  LF tricpes press down 3x10 20 lbs   LAQ red band 3x10  Hip adduction with ball 3x10  Hip abduction red 3x15   10/15 Nu-step 5 min   Hurdle walk 4 laps side ways and forward and back   Step onto air-ex fwd and lateral 2x10 each leg   Airex: Base of support eyes open/eyes closed 2 x 30 seconds  Airex tandem stance 2 x 30 seconds each leg  Seated clamshell red 2 x 15 Seated march red 2 x 15 Seated ball squeeze red 2 x 15 LAQ 2 x 15 no resistance    10/9 Nu-step 5 min   Hurdle walk 4 laps side ways and forward and back  Step onto air-ex fwd and lateral 2x10 each leg   Punch 2 lbs 2x10  Biceps curl 3x12  3lbs    Manual: trigger point release to upper trap   Progress Note:   NuStep 5 minutes level 4 Vitals in sitting after Nustep HR:  84 O2:  96%     Access Code: [email or web address] URL: [email or web address] Date: 10/08/2022 Prepared by: Lorayne Bender  Exercises - Seated Hip Abduction with Resistance  - 1 x daily - 7 x weekly - 3 sets - 10-15 reps - Seated March  - 1 x daily - 7 x weekly - 3 sets - 10-15 reps - Seated Knee Extension with Resistance  - 1 x daily - 7 x weekly - 3 sets - 10-15 reps  PATIENT EDUCATION:  Education details: HEP, symptom management.  Person educated: Patient Education method: Explanation, Demonstration, Tactile cues, Verbal  cues Education comprehension: verbalized understanding, returned demonstration, verbal cues required, tactile cues required, and needs further education  HOME EXERCISE PROGRAM: Access Code: [email or web address] URL: [email or web address] Date: 10/08/2022 Prepared by: Lorayne Bender  Exercises - Seated Hip Abduction with Resistance  - 1 x daily - 7 x weekly - 3 sets - 10-15 reps - Seated March  - 1 x daily - 7 x weekly - 3 sets - 10-15 reps - Seated Knee Extension with Resistance  - 1 x daily - 7 x weekly - 3 sets - 10-15 reps  ASSESSMENT:  CLINICAL IMPRESSION: Patient is making good progress.  She tolerated a full gym program today.  She is able to do 4 machines in a cardiovascular activity.  She had mild shortness of breath but nothing major.  She had no significant increase in knee pain.  Will continue to progress towards independent workout program.  OBJECTIVE IMPAIRMENTS: Abnormal gait, decreased endurance, difficulty walking, decreased ROM, decreased strength, impaired UE functional use, and pain.   ACTIVITY LIMITATIONS: carrying, standing, stairs, and locomotion level  PARTICIPATION LIMITATIONS: meal prep, cleaning, laundry, driving, shopping, community activity, and yard work  PERSONAL FACTORS: 3+ comorbidities: COPD, polymyalgia rheumatica, headahces  are also affecting patient's functional outcome.   REHAB POTENTIAL: Good  CLINICAL DECISION MAKING: Evolving/moderate complexity declining overall functional mobility   EVALUATION COMPLEXITY: Moderate   GOALS: Goals reviewed with patient? Yes  SHORT TERM GOALS: Target date: 11/05/2022   Patient will increase gross bilateral LE strength by 5 lbs  Baseline: Goal status: partially met  2.  Patient will perform 3 or 6 minute walk test  Baseline:  Goal status: GOAL MET  3.  Patient will be independent with basic HEP  Baseline:  Goal status: GOAL MET  4.  Patients active left shoulder flexion will be 110 without  pain  Baseline:  Goal status: GOAL MET   LONG TERM GOALS: Target date: 01/22/2023    Patient will stand for 15 minutes without dyspnea and hip pain in order to perform ADL's  Baseline:  Goal status: ONGOING  2.  Patient will ambulate community distances without pain or dyspnea  Baseline:  Goal status: ONGOING  3.  Patient will have a compete exercise program to promote endurance and functional mobility  Baseline:  Goal status: ONGOING   PLAN:  PT FREQUENCY: 1-2x/week  PT DURATION: 5-6 weeks  PLANNED INTERVENTIONS: Therapeutic exercises, Therapeutic activity, Neuromuscular re-education, Balance training, Gait training, Patient/Family education, Self Care, Joint mobilization, Aquatic Therapy, Dry Needling, Cryotherapy, Moist heat, Taping, Ionotophoresis 4mg /ml Dexamethasone, Manual therapy, and Re-evaluation  PLAN FOR NEXT SESSION: Cont working on strength,  proprioception, and functional endurance.  do 3 or 6 minute walk test if able. The patient would benefit from a general exercise program. She also has left shoulder dysfunction. We can give her a program that would help the left shoulder but not get it more irritated. Consider endurance exercises such as the bike or nu-step. Monitor Sao2.    Audie Clear III PT, DPT 01/03/23 3:04 PM

## 2023-01-08 ENCOUNTER — Encounter (HOSPITAL_BASED_OUTPATIENT_CLINIC_OR_DEPARTMENT_OTHER): Payer: Self-pay | Admitting: Physical Therapy

## 2023-01-08 ENCOUNTER — Ambulatory Visit (HOSPITAL_BASED_OUTPATIENT_CLINIC_OR_DEPARTMENT_OTHER): Payer: Medicare Other | Admitting: Physical Therapy

## 2023-01-08 DIAGNOSIS — M25512 Pain in left shoulder: Secondary | ICD-10-CM

## 2023-01-08 DIAGNOSIS — M6281 Muscle weakness (generalized): Secondary | ICD-10-CM | POA: Diagnosis not present

## 2023-01-08 DIAGNOSIS — R262 Difficulty in walking, not elsewhere classified: Secondary | ICD-10-CM | POA: Diagnosis not present

## 2023-01-08 DIAGNOSIS — R293 Abnormal posture: Secondary | ICD-10-CM | POA: Diagnosis not present

## 2023-01-08 NOTE — Therapy (Signed)
We do need on Friday OUTPATIENT PHYSICAL THERAPY LOWER EXTREMITY TREATMENT   Patient Name: Sonia [name] MRN: [id number] DOB:1940-06-13, 82 y.o., female Today's Date: 01/08/2023  END OF SESSION:  PT End of Session - 01/08/23 1411     Visit Number 16    Number of Visits 20    Date for PT Re-Evaluation 01/22/23    PT Start Time 1345    PT Stop Time 1427    PT Time Calculation (min) 42 min    Activity Tolerance Patient tolerated treatment well    Behavior During Therapy [facility] for tasks assessed/performed                    Past Medical History:  Diagnosis Date   Arthritis    Asthma    daily and prn inhalers; states good control currently   Breast cancer (HCC)    right   CAD S/P percutaneous coronary angioplasty 07/15/2016   COPD (chronic obstructive pulmonary disease) (HCC)    Dyspnea    occ with exertion   Fibromyalgia    GERD (gastroesophageal reflux disease)    no current med.   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   Vitamin D deficiency    Past Surgical History:  Procedure Laterality Date   ABDOMINAL HYSTERECTOMY     partial   APPENDECTOMY     BREAST BIOPSY Left    BREAST CYST EXCISION Left    BREAST EXCISIONAL BIOPSY Left 03/2015   BREAST LUMPECTOMY Right 08/2018   BREAST LUMPECTOMY WITH RADIOACTIVE SEED LOCALIZATION Left 04/07/2015   Procedure: BREAST LUMPECTOMY WITH RADIOACTIVE SEED LOCALIZATION;  Surgeon: Harriette Bouillon, MD;  Location: [facility];  Service: General;  Laterality: Left;   BREAST LUMPECTOMY WITH RADIOACTIVE SEED LOCALIZATION Right 08/13/2018   Procedure: RIGHT BREAST  LUMPECTOMY WITH RADIOACTIVE SEED LOCALIZATION;  Surgeon: Emelia Loron, MD;  Location: [facility];  Service: General;  Laterality: Right;   COLONOSCOPY     CORONARY STENT INTERVENTION N/A 07/15/2016   Procedure: Coronary Stent Intervention;  Surgeon: Tonny Bollman, MD;  Location[facility] INVASIVE CV LAB;  Service: Cardiovascular;  Laterality: N/A;   ESOPHAGOGASTRODUODENOSCOPY (EGD) WITH PROPOFOL Left 07/25/2016   Procedure: ESOPHAGOGASTRODUODENOSCOPY (EGD) WITH PROPOFOL;  Surgeon: Kerin Salen, MD;  Location: [facility] ENDOSCOPY;  Service: Gastroenterology;  Laterality: Left;   KNEE ARTHROSCOPY Right 12/07/2003   LAPAROSCOPIC SALPINGO OOPHERECTOMY Right 09/08/2003   LEFT HEART CATH N/A 07/15/2016   Procedure: Left Heart Cath;  Surgeon: Tonny Bollman, MD;  Location: [facility] INVASIVE CV LAB;  Service: Cardiovascular;  Laterality: N/A;   LEFT HEART CATH AND CORONARY ANGIOGRAPHY N/A 04/14/2019   Procedure: LEFT HEART CATH AND CORONARY ANGIOGRAPHY;  Surgeon: Kathleene Hazel, MD;  Location: [facility] INVASIVE CV LAB;  Service: Cardiovascular;  Laterality: N/A;   LYSIS OF ADHESION  09/08/2003   NASAL SINUS SURGERY  age 68   RECTAL POLYPECTOMY  10/02/2006   Patient Active Problem List   Diagnosis Date Noted   Atrial tachycardia (HCC) 08/07/2022   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 08/04/2022   Hyponatremia 08/04/2022  Thrombocytopenia (HCC) 08/04/2022   COPD with acute exacerbation (HCC) 03/11/2022   Non-STEMI (non-ST elevated myocardial infarction) (HCC) 03/11/2022   RSV (respiratory syncytial virus pneumonia) 03/11/2022   Acute bronchiolitis due to respiratory syncytial virus (RSV) 03/11/2022   Coronary artery disease of native artery of native heart with stable angina pectoris (HCC)    Asthma, chronic, moderate persistent, uncomplicated 02/03/2019   DOE (dyspnea on exertion) 07/31/2018   Osteopenia 06/04/2018   Carcinoma of upper-outer quadrant of right breast  in female, estrogen receptor positive (HCC) 05/30/2018   GI bleed 07/25/2016   Melena 07/24/2016   Acute renal insufficiency 07/24/2016   CAD S/P percutaneous coronary angioplasty 07/16/2016   NSTEMI (non-ST elevated myocardial infarction) (HCC) 07/13/2016   Genetic testing 06/09/2015   Family history of breast cancer in female 05/10/2015   History of melanoma 05/10/2015   Atypical lobular hyperplasia of left breast 04/26/2015   Vision problem 01/08/2014   Hiatus hernia syndrome 08/01/2012   Palpitations 10/18/2010   Acute chest pain 10/18/2010   Hypothyroidism 10/18/2010   Dyslipidemia 06/07/2009   Upper airway cough syndrome 03/29/2009   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   GERD 12/05/2006    PCP: Mila Palmer, MD  REFERRING PROVIDER: Mila Palmer,   REFERRING DIAG:  Diagnosis  E44.0 (ICD-10-CM) - Moderate protein-calorie malnutrition  J45.909 (ICD-10-CM) - Unspecified asthma, uncomplicated  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 She has tried her inhalers but is still having some difficulty. She did well after the last exercise session.   PERTINENT HISTORY: Polymyalgia, arthritis, asthma ( worse int he morning); MI 3 years prior; headaches mostly on rainy days; Hashimoto thyroiditis. Old knee scope but doing well.Marland Kitchen  PAIN:  Are you having pain? Yes: NPRS scale: 2 /10 Pain location: bilateral knees  Pain description: aching  Aggravating factors: sit to stand transfers Relieving factors: standing and  walking improves the stiffness  PRECAUTIONS: None  RED FLAGS: None   WEIGHT BEARING RESTRICTIONS: No  FALLS:  Has patient fallen in last 6 months? No collapsed prior to the hospital  LIVING ENVIRONMENT: 3 steps into the house but has a railing   OCCUPATION: Retired Had a walk in Advertising copywriter put in    PLOF: Independent  PATIENT GOALS:  Strength and general mobility    NEXT MD VISIT:  Nothing scheduled   OBJECTIVE:   DIAGNOSTIC FINDINGS:   CXR: IMPRESSION: No active cardiopulmonary disease.   Stable large hiatal hernia.  PATIENT SURVEYS:  Incorrect set up    LOWER EXTREMITY MMT:  MMT Right eval Left eval Right 10/1 Left 10/1  Hip flexion 17.4 16.6 24.4 23.5  Hip extension      Hip abduction 16.2 18.4 14.9   Pt was tender with pushing 16.2  pt was tender with pushing  Hip adduction      Hip internal rotation      Hip external rotation      Knee flexion      Knee extension Unable to do 2nd to tenderness in her legs Unable to do 2nd to tenderness in her legs  Ankle dorsiflexion      Ankle plantarflexion      Ankle inversion      Ankle eversion       (Blank rows = not tested)  UPPER EXTREMITY ROM:   ROM Right eval Left eval Left 10/1  Shoulder flexion  80 degrees with pain  135 with less pain  Shoulder extension     Shoulder abduction     Shoulder adduction     Shoulder extension     Shoulder internal rotation     Shoulder external rotation     Elbow flexion     Elbow extension     Wrist flexion     Wrist extension     Wrist ulnar deviation     Wrist radial deviation     Wrist pronation     Wrist supination      (Blank rows = not tested)   FUNCTIONAL TESTS:  3 MWT:  Pt had labored breathing and did not complete the test.  PT had pt sit down.  Her O2 was good.  O2:  98%, HR:  107 which decreased to 85 with rest.  Pt used an inhaler when she sat down.  Pt ambulated 345 ft.  Punches 2 pound 2 x 10 R UE Bicep curl 2 x 10 2 pound  bilat        TODAY'S TREATMENT:                                                                                                                              01/08/2023 Cybex row 3x10 20 lbs  LF triceps press down 3x12 25 lbs  LF Cable extension 3x12 15 lbs LF hip abduction 3x15  Cybex leg press 3x15 50 lbs    10/24 Nu-step 5 min   Cybex row 3x10 20 lbs  LF triceps press down 3x15  LF hip abduction 3x15  Cybex leg press 3z15 50 lbns   Nu-step 5 min L4    10/22 NuStep 5  Hip abduction machine 3x15 45 lbs  Cable pull down [employer] 3x12  Cybex leg press 3x10 50 lbs    Air-ex step on x20 each leg  Heel/toe x20  Standing march x20 each leg      10/17 NuStep 5  LF row 15 lbs 3x10  Cybex leg press 3x10 50 lbs  LF tricpes press down 3x10 20 lbs   LAQ red band 3x10  Hip adduction with ball 3x10  Hip abduction red 3x15   10/15 Nu-step 5 min   Hurdle walk 4 laps side ways and forward and back   Step onto air-ex fwd and lateral 2x10 each leg   Airex: Base of support eyes open/eyes closed 2 x 30 seconds  Airex tandem stance 2 x 30 seconds each leg  Seated clamshell red 2 x 15 Seated march red 2 x 15 Seated ball squeeze red 2 x 15 LAQ 2  x 15 no resistance    10/9 Nu-step 5 min   Hurdle walk 4 laps side ways and forward and back   Step onto air-ex fwd and lateral 2x10 each leg   Punch 2 lbs 2x10  Biceps curl 3x12  3lbs    Manual: trigger point release to upper trap   Progress Note:   NuStep 5 minutes level 4 Vitals in sitting after Nustep HR:  84 O2:  96%     Access Code: [email or web address] URL: [email or web address] Date: 10/08/2022 Prepared by: Lorayne Bender  Exercises - Seated Hip Abduction with Resistance  - 1 x daily - 7 x weekly - 3 sets - 10-15 reps - Seated March  - 1 x daily - 7 x weekly - 3 sets - 10-15 reps - Seated Knee Extension with Resistance  - 1 x daily - 7 x weekly - 3 sets - 10-15 reps  PATIENT  EDUCATION:  Education details: HEP, symptom management.  Person educated: Patient Education method: Explanation, Demonstration, Tactile cues, Verbal cues Education comprehension: verbalized understanding, returned demonstration, verbal cues required, tactile cues required, and needs further education  HOME EXERCISE PROGRAM: Access Code: [email or web address] URL: [email or web address] Date: 10/08/2022 Prepared by: Lorayne Bender  Exercises - Seated Hip Abduction with Resistance  - 1 x daily - 7 x weekly - 3 sets - 10-15 reps - Seated March  - 1 x daily - 7 x weekly - 3 sets - 10-15 reps - Seated Knee Extension with Resistance  - 1 x daily - 7 x weekly - 3 sets - 10-15 reps  ASSESSMENT:  CLINICAL IMPRESSION: Patient continues to tolerate gym exercises well.  We reviewed exercises and how to greater weights.  Patient was able to use the cable column today.  We reviewed how to set up the cable column.  She was encouraged to have a trainer to help her if needed.  We also reviewed the right start program.  She feels like this may be a good transition for her.  She also was interested in pool session or 2 to review her pool exercises.  We scheduled that for a few weeks from now.  The patient is progressing towards discharge.  We will see her on Thursday and then talk about her plan going forward.  OBJECTIVE IMPAIRMENTS: Abnormal gait, decreased endurance, difficulty walking, decreased ROM, decreased strength, impaired UE functional use, and pain.   ACTIVITY LIMITATIONS: carrying, standing, stairs, and locomotion level  PARTICIPATION LIMITATIONS: meal prep, cleaning, laundry, driving, shopping, community activity, and yard work  PERSONAL FACTORS: 3+ comorbidities: COPD, polymyalgia rheumatica, headahces  are also affecting patient's functional outcome.   REHAB POTENTIAL: Good  CLINICAL DECISION MAKING: Evolving/moderate complexity declining overall functional mobility   EVALUATION  COMPLEXITY: Moderate   GOALS: Goals reviewed with patient? Yes  SHORT TERM GOALS: Target date: 11/05/2022   Patient will increase gross bilateral LE strength by 5 lbs  Baseline: Goal status: partially met  2.  Patient will perform 3 or 6 minute walk test  Baseline:  Goal status: GOAL MET  3.  Patient will be independent with basic HEP  Baseline:  Goal status: GOAL MET  4.  Patients active left shoulder flexion will be 110 without pain  Baseline:  Goal status: GOAL MET   LONG TERM GOALS: Target date: 01/22/2023    Patient will stand for 15 minutes without dyspnea and hip pain in order to perform ADL's  Baseline:  Goal status: ONGOING  2.  Patient will ambulate community  distances without pain or dyspnea  Baseline:  Goal status: ONGOING  3.  Patient will have a compete exercise program to promote endurance and functional mobility  Baseline:  Goal status: ONGOING   PLAN:  PT FREQUENCY: 1-2x/week  PT DURATION: 5-6 weeks  PLANNED INTERVENTIONS: Therapeutic exercises, Therapeutic activity, Neuromuscular re-education, Balance training, Gait training, Patient/Family education, Self Care, Joint mobilization, Aquatic Therapy, Dry Needling, Cryotherapy, Moist heat, Taping, Ionotophoresis 4mg /ml Dexamethasone, Manual therapy, and Re-evaluation  PLAN FOR NEXT SESSION: Cont working on strength, proprioception, and functional endurance.  do 3 or 6 minute walk test if able. The patient would benefit from a general exercise program. She also has left shoulder dysfunction. We can give her a program that would help the left shoulder but not get it more irritated. Consider endurance exercises such as the bike or nu-step. Monitor Sao2.    Audie Clear III PT, DPT 01/08/23 3:40 PM

## 2023-01-10 ENCOUNTER — Ambulatory Visit (HOSPITAL_BASED_OUTPATIENT_CLINIC_OR_DEPARTMENT_OTHER): Payer: Medicare Other | Admitting: Physical Therapy

## 2023-01-15 ENCOUNTER — Ambulatory Visit (HOSPITAL_BASED_OUTPATIENT_CLINIC_OR_DEPARTMENT_OTHER): Payer: Medicare Other | Admitting: Physical Therapy

## 2023-01-17 ENCOUNTER — Ambulatory Visit (HOSPITAL_BASED_OUTPATIENT_CLINIC_OR_DEPARTMENT_OTHER): Payer: Medicare Other | Admitting: Physical Therapy

## 2023-01-22 ENCOUNTER — Ambulatory Visit (HOSPITAL_BASED_OUTPATIENT_CLINIC_OR_DEPARTMENT_OTHER): Payer: Medicare Other | Admitting: Physical Therapy

## 2023-01-24 ENCOUNTER — Ambulatory Visit (HOSPITAL_BASED_OUTPATIENT_CLINIC_OR_DEPARTMENT_OTHER): Payer: Medicare Other | Admitting: Physical Therapy

## 2023-01-25 ENCOUNTER — Ambulatory Visit (HOSPITAL_BASED_OUTPATIENT_CLINIC_OR_DEPARTMENT_OTHER): Payer: Self-pay | Admitting: Physical Therapy

## 2023-01-29 ENCOUNTER — Ambulatory Visit (HOSPITAL_BASED_OUTPATIENT_CLINIC_OR_DEPARTMENT_OTHER): Payer: Medicare Other | Admitting: Physical Therapy

## 2023-01-31 ENCOUNTER — Ambulatory Visit (HOSPITAL_BASED_OUTPATIENT_CLINIC_OR_DEPARTMENT_OTHER): Payer: Medicare Other | Admitting: Physical Therapy

## 2023-02-01 ENCOUNTER — Telehealth: Payer: Self-pay | Admitting: Internal Medicine

## 2023-02-01 NOTE — Telephone Encounter (Signed)
Tried calling patient to make appointment from recall. patient states she can't make it to appointment during winter months. She has a hard tim moving and getting around when its cold.

## 2023-02-06 ENCOUNTER — Other Ambulatory Visit: Payer: Self-pay | Admitting: Internal Medicine

## 2023-02-06 ENCOUNTER — Telehealth: Payer: Self-pay | Admitting: Internal Medicine

## 2023-02-06 MED ORDER — ALBUTEROL SULFATE HFA 108 (90 BASE) MCG/ACT IN AERS: 2.0000 | INHALATION_SPRAY | RESPIRATORY_TRACT | 0 refills | Status: DC | PRN

## 2023-02-06 NOTE — Telephone Encounter (Signed)
[facility] on [address] Calling. State PT is out of Albuterol, has one puff left and is concerned she will go w/o over the holiday. Please call in today. Her # is [phone]

## 2023-02-06 NOTE — Telephone Encounter (Signed)
Called and spoke with pt, albuterol inhaler has been sent to confirmed pharmacy, nfn

## 2023-03-18 ENCOUNTER — Other Ambulatory Visit: Payer: Self-pay | Admitting: Internal Medicine

## 2023-03-18 NOTE — Telephone Encounter (Signed)
Pt needs an appointment for further refills  

## 2023-05-13 ENCOUNTER — Other Ambulatory Visit: Payer: Self-pay | Admitting: Internal Medicine

## 2023-05-28 ENCOUNTER — Other Ambulatory Visit: Payer: Self-pay | Admitting: Internal Medicine

## 2023-06-02 ENCOUNTER — Encounter: Payer: Self-pay | Admitting: Internal Medicine

## 2023-06-10 ENCOUNTER — Other Ambulatory Visit: Payer: Self-pay | Admitting: Internal Medicine

## 2023-06-26 ENCOUNTER — Other Ambulatory Visit: Payer: Self-pay | Admitting: Internal Medicine

## 2023-06-27 ENCOUNTER — Telehealth: Payer: Self-pay | Admitting: Internal Medicine

## 2023-06-27 MED ORDER — ALBUTEROL SULFATE HFA 108 (90 BASE) MCG/ACT IN AERS: 2.0000 | INHALATION_SPRAY | RESPIRATORY_TRACT | 0 refills | Status: AC | PRN

## 2023-06-27 MED ORDER — ALBUTEROL SULFATE (2.5 MG/3ML) 0.083% IN NEBU: 2.5000 mg | INHALATION_SOLUTION | Freq: Four times a day (QID) | RESPIRATORY_TRACT | 0 refills | Status: AC | PRN

## 2023-06-27 NOTE — Telephone Encounter (Signed)
 PT needs Albuterol. She is out. She has not been see in a year but has an appt Monday. [facility].  Would like before the weekend. TY.

## 2023-06-27 NOTE — Telephone Encounter (Signed)
 Albuterol inhaler and neb both refilled x 1 only  I have called the pt and left her detailed msg letting her know  Nothing further needed

## 2023-07-01 ENCOUNTER — Other Ambulatory Visit: Payer: Self-pay | Admitting: Family Medicine

## 2023-07-01 ENCOUNTER — Ambulatory Visit: Admitting: Primary Care

## 2023-07-01 MED ORDER — BUDESONIDE-FORMOTEROL FUMARATE 160-4.5 MCG/ACT IN AERO: 2.0000 | INHALATION_SPRAY | Freq: Two times a day (BID) | RESPIRATORY_TRACT | 1 refills | Status: DC

## 2023-07-01 NOTE — Telephone Encounter (Signed)
 fyi

## 2023-07-01 NOTE — Telephone Encounter (Signed)
 Copied from CRM [id number]. Topic: Clinical - Medication Refill >> Jul 01, 2023 10:14 AM Juliana Ocean wrote: Most Recent Primary Care Visit:   Medication:  budesonide -formoterol  (SYMBICORT ) 160-4.5 MCG/ACT inhaler (3 pack)  Has the patient contacted their pharmacy? No  Is this the correct pharmacy for this prescription? Yes If no, delete pharmacy and type the correct one.  This is the patient's preferred pharmacy:   [facility] - [place], [place] - [address] [place] [address] Phone: [phone] Fax: [phone]   Has the prescription been filled recently? Yes  Is the patient out of the medication? Yes  Has the patient been seen for an appointment in the last year OR does the patient have an upcoming appointment? Yes  Can we respond through MyChart? Yes  Pt had appt today, but id not sleep well and doesn't feel up to coming in for appt. Pt declined  triage X2.

## 2023-07-02 NOTE — Telephone Encounter (Signed)
 No more refills until seen, has not been seen in 1 year. Needs to schedule visit with Dr. Waymond Hailey

## 2023-07-19 ENCOUNTER — Other Ambulatory Visit: Payer: Self-pay | Admitting: Internal Medicine

## 2023-08-26 ENCOUNTER — Ambulatory Visit: Admitting: Primary Care

## 2023-09-02 ENCOUNTER — Telehealth: Payer: Self-pay | Admitting: Internal Medicine

## 2023-09-02 MED ORDER — SYMBICORT 160-4.5 MCG/ACT IN AERO: 2.0000 | INHALATION_SPRAY | Freq: Two times a day (BID) | RESPIRATORY_TRACT | 0 refills | Status: AC

## 2023-09-02 NOTE — Telephone Encounter (Signed)
 Called the pt and there was no answer -LMTCB   I went ahead and refilled her symbicort  but she needs to schedule appt with MW or APP

## 2023-09-02 NOTE — Telephone Encounter (Signed)
 I have a patient of Sonia [name] on the line and she is out of her Symbicort  and she says she can't go without it, she sounds like she is SOB on the phone. Please give her a call back asap!

## 2023-09-04 DIAGNOSIS — J4489 Other specified chronic obstructive pulmonary disease: Secondary | ICD-10-CM | POA: Diagnosis not present

## 2023-09-05 DIAGNOSIS — J441 Chronic obstructive pulmonary disease with (acute) exacerbation: Secondary | ICD-10-CM | POA: Diagnosis not present

## 2023-09-06 NOTE — Telephone Encounter (Signed)
 Patient called again, she states she needs her medicine refilled and she cannot come in the office due to her worsening SOB and the weather is making it worse. She stated she wants a call or message directly from [place] and not a nurse of what she can do. Please give patient a call back!

## 2023-09-06 NOTE — Telephone Encounter (Signed)
 I called the pt and there was no answer- left detailed msg that she needs to call back and schedule appt. Closing per protocol.

## 2023-09-07 NOTE — Telephone Encounter (Signed)
 Not clear what medicine she's referring to but ok to refill it but be sure has f/u appt asap- if refuses can do Prednisone  10 mg take  4 each am x 2 days,   2 each am x 2 days,  1 each am x 2 days and stop   Willing to see 1st thing in am if worried about the heat.

## 2023-09-10 NOTE — Telephone Encounter (Signed)
 ATC x1 LMTCB for appt

## 2023-09-12 NOTE — Telephone Encounter (Addendum)
 ATC x2 regarding refil and appt. LMTCB Closing per protocol

## 2023-09-20 DIAGNOSIS — M353 Polymyalgia rheumatica: Secondary | ICD-10-CM | POA: Diagnosis not present

## 2023-09-20 DIAGNOSIS — Z853 Personal history of malignant neoplasm of breast: Secondary | ICD-10-CM | POA: Diagnosis not present

## 2023-09-20 DIAGNOSIS — M316 Other giant cell arteritis: Secondary | ICD-10-CM | POA: Diagnosis not present

## 2023-09-20 DIAGNOSIS — I25118 Atherosclerotic heart disease of native coronary artery with other forms of angina pectoris: Secondary | ICD-10-CM | POA: Diagnosis not present

## 2023-09-20 DIAGNOSIS — M797 Fibromyalgia: Secondary | ICD-10-CM | POA: Diagnosis not present

## 2023-09-20 DIAGNOSIS — J449 Chronic obstructive pulmonary disease, unspecified: Secondary | ICD-10-CM | POA: Diagnosis not present

## 2023-10-01 ENCOUNTER — Other Ambulatory Visit: Payer: Self-pay | Admitting: Internal Medicine

## 2023-11-13 DIAGNOSIS — Z0000 Encounter for general adult medical examination without abnormal findings: Secondary | ICD-10-CM | POA: Diagnosis not present

## 2023-11-13 DIAGNOSIS — E538 Deficiency of other specified B group vitamins: Secondary | ICD-10-CM | POA: Diagnosis not present

## 2023-11-13 DIAGNOSIS — Z85820 Personal history of malignant melanoma of skin: Secondary | ICD-10-CM | POA: Diagnosis not present

## 2023-11-13 DIAGNOSIS — E039 Hypothyroidism, unspecified: Secondary | ICD-10-CM | POA: Diagnosis not present

## 2023-11-13 DIAGNOSIS — Z7689 Persons encountering health services in other specified circumstances: Secondary | ICD-10-CM | POA: Diagnosis not present

## 2023-11-13 DIAGNOSIS — J4540 Moderate persistent asthma, uncomplicated: Secondary | ICD-10-CM | POA: Diagnosis not present

## 2023-11-13 DIAGNOSIS — Z8679 Personal history of other diseases of the circulatory system: Secondary | ICD-10-CM | POA: Diagnosis not present

## 2023-11-19 DIAGNOSIS — E039 Hypothyroidism, unspecified: Secondary | ICD-10-CM | POA: Diagnosis not present

## 2023-11-19 DIAGNOSIS — R799 Abnormal finding of blood chemistry, unspecified: Secondary | ICD-10-CM | POA: Diagnosis not present

## 2023-11-19 DIAGNOSIS — Z0000 Encounter for general adult medical examination without abnormal findings: Secondary | ICD-10-CM | POA: Diagnosis not present

## 2023-11-19 DIAGNOSIS — Z8679 Personal history of other diseases of the circulatory system: Secondary | ICD-10-CM | POA: Diagnosis not present

## 2023-11-19 DIAGNOSIS — E538 Deficiency of other specified B group vitamins: Secondary | ICD-10-CM | POA: Diagnosis not present

## 2024-01-09 ENCOUNTER — Other Ambulatory Visit: Payer: Self-pay | Admitting: Internal Medicine

## 2024-01-13 DIAGNOSIS — J45909 Unspecified asthma, uncomplicated: Secondary | ICD-10-CM | POA: Diagnosis not present

## 2024-01-13 DIAGNOSIS — R7303 Prediabetes: Secondary | ICD-10-CM | POA: Diagnosis not present

## 2024-01-13 DIAGNOSIS — E039 Hypothyroidism, unspecified: Secondary | ICD-10-CM | POA: Diagnosis not present

## 2024-03-11 ENCOUNTER — Telehealth: Payer: Self-pay | Admitting: Cardiovascular Disease

## 2024-03-11 NOTE — Telephone Encounter (Signed)
 STAT if patient feels like he/she is going to faint   1. Are you feeling dizzy, lightheaded, or faint right now? No    2. Have you passed out?  No (If yes move to .SYNCOPECHMG)   3. Do you have any other symptoms? PT stated she had a Dizziness episode 12/22 and experienced LT Leg numbness    4. Have you checked your HR and BP (record if available)? no

## 2024-03-11 NOTE — Telephone Encounter (Signed)
 Called and spoke to pt. She reports that on 03/02/24 she was trying to remove a hard lump of dead skin on her lower left leg. She then walked to her kitchen and suddenly her left leg was dangling and she had no control of it; she also had extreme dizziness. This episode resolved and then on the next day she had a sharp pain under her left scapula that lasted around 2-3 hours. She also felt very fatigued. Denied unusual amt of SOB (has asthma so does feel SOB on a normal basis), denied syncope, denied vision loss, denied palpitations, denied slurred speech. She did not take any SL NTG and is not taking any Cardiac medications.   Advised pt that she should have gone to the nearest ER or at least UC. She is requesting to see an MD and refuses APP as she had a bad experience at her PCP's office once. She does state that if it happens again, she will go to ER.   ER precautions were reviewed again and pt verbalized understanding. For now she is scheduled to see DOD on 03/17/24 (Dr. Michele). Advised her to bring in her current meds, come in fasting, and bring any recent BP/HR info, also to write down questions for MD.

## 2024-03-13 NOTE — Telephone Encounter (Signed)
 Spoke with pt regarding Dr. Tyree suggestions. Pt was dissatisfied with his response and would like to see Dr. Verlin as she is sure she threw a clot and believes she needs to be on a blood thinner. Pt asked for appointment on 1/6  with Dr. Michele be canceled. Pt is due for 1 year follow with Dr. Verlin in Feb. No appointments available. Pt was told a message would be sent to our scheduling team to make an appointment. Pt verbalized understanding. All questions if any were answered.

## 2024-03-13 NOTE — Telephone Encounter (Signed)
 I reviewed the last note from her primary cardiology and based on the telephone encounter her symptoms likely non-cardiac (dangling of feet, dizziness, scapular pain, 2-3 hrs, fatigue). She did not have any cardiac symptoms.   PCP appt would be more appropriate.   If she feels strongly her symptoms are cardiac - will see her. Please get orthostatic vital signs and EKG on arrival.   Dr. Michele

## 2024-03-17 ENCOUNTER — Ambulatory Visit: Admitting: Cardiology

## 2024-03-17 NOTE — Telephone Encounter (Signed)
 Daughter returned RN's call and stated she will let patient know appointment scheduled on 1/13.

## 2024-03-17 NOTE — Telephone Encounter (Signed)
 LVMTCB 03/18/2023 at 10:50 am  Okay to schedule pt with Dr. Verlin on 03/24/24 at 2:40 pm (7 Day Hold) per Jonette Jumbo [facility] for [place]).

## 2024-03-24 ENCOUNTER — Ambulatory Visit: Admitting: Cardiovascular Disease

## 2024-03-24 NOTE — Progress Notes (Unsigned)
 "   No chief complaint on file.  History of Present Illness: 84 yo female with history of CAD, COPD, GERD and hypothyroidism who is here today for cardiac follow up. She was admitted to [facility] May 2018 with a NSTEMI and had a drug eluting stent placed in the RCA. There was a moderate stenosis in the mid LAD. Echo May 2018 with normal LV systolic function, mild valve disease. She was discharged on Brilinta  and readmitted one week later with a GI bleed. EGD showed gastritis. She was changed from Brilinta  to Plavix  and bleeding resolved. Chest pain and dyspnea at times felt to be due to her asthma as it improved with her inhalers. She has not tolerated statins. She was found to have breast cancer and has undergone lumpectomy in 2020.  Cardiac cath in February 2021 with non-obstructive disease in the LAD and Circumflex and patent stent in the RCA with minimal restenosis. LV systolic function was normal. She was admitted to [facility] in December 2023 with dyspnea and was felt to have RSV and COPD exacerbation. She was followed by cardiology while admitted. Troponin elevated to 1063. She declined cardiac cath. Echo December 2023 with LVEF>75%. Mild mitral stenosis. She was admitted to [facility] in May 2024 with palpitations and was found to have atrial tachycardia. She was started on Multaq . ***   She is here today for follow up. The patient denies any chest pain, dyspnea, palpitations, lower extremity edema, orthopnea, PND, dizziness, near syncope or syncope.    Primary Care Physician: Verena Mems, MD  Past Medical History:  Diagnosis Date   Arthritis    Asthma    daily and prn inhalers; states good control currently   Breast cancer [facility])    right   CAD S/P percutaneous coronary angioplasty 07/15/2016   COPD (chronic obstructive pulmonary disease) (HCC)    Dyspnea    occ with exertion   Fibromyalgia    GERD (gastroesophageal reflux disease)    no current med.   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   Vitamin D deficiency     Past Surgical History:  Procedure Laterality Date   ABDOMINAL HYSTERECTOMY     partial   APPENDECTOMY     BREAST BIOPSY Left    BREAST CYST EXCISION Left    BREAST EXCISIONAL BIOPSY Left 03/2015   BREAST LUMPECTOMY Right 08/2018   BREAST LUMPECTOMY WITH RADIOACTIVE SEED LOCALIZATION Left 04/07/2015   Procedure: BREAST LUMPECTOMY WITH RADIOACTIVE SEED LOCALIZATION;  Surgeon: Debby Shipper, MD;  Location: [facility];  Service: General;  Laterality: Left;   BREAST LUMPECTOMY WITH RADIOACTIVE SEED LOCALIZATION Right 08/13/2018   Procedure: RIGHT BREAST LUMPECTOMY WITH RADIOACTIVE SEED LOCALIZATION;  Surgeon: Ebbie Cough, MD;  Location: [facility];  Service: General;  Laterality: Right;   COLONOSCOPY     CORONARY STENT INTERVENTION N/A 07/15/2016   Procedure: Coronary Stent Intervention;  Surgeon: Wonda Sharper, MD;  Location: [facility] INVASIVE CV LAB;  Service: Cardiovascular;  Laterality: N/A;   ESOPHAGOGASTRODUODENOSCOPY (EGD) WITH PROPOFOL  Left 07/25/2016   Procedure: ESOPHAGOGASTRODUODENOSCOPY (EGD) WITH PROPOFOL ;  Surgeon:  Saintclair Jasper, MD;  Location: [facility] ENDOSCOPY;  Service: Gastroenterology;  Laterality: Left;   KNEE ARTHROSCOPY Right 12/07/2003   LAPAROSCOPIC SALPINGO OOPHERECTOMY Right 09/08/2003   LEFT HEART CATH N/A 07/15/2016   Procedure: Left Heart Cath;  Surgeon: Wonda Sharper, MD;  Location: [facility] INVASIVE CV LAB;  Service: Cardiovascular;  Laterality: N/A;   LEFT HEART CATH AND CORONARY ANGIOGRAPHY N/A 04/14/2019   Procedure: LEFT HEART CATH AND CORONARY ANGIOGRAPHY;  Surgeon: Verlin Lonni BIRCH, MD;  Location: [facility] INVASIVE CV LAB;  Service: Cardiovascular;  Laterality: N/A;   LYSIS OF ADHESION  09/08/2003   NASAL SINUS SURGERY  age 22   RECTAL POLYPECTOMY  10/02/2006    Current Outpatient Medications  Medication Sig Dispense Refill   albuterol  (PROVENTIL ) (2.5 MG/3ML) 0.083% nebulizer solution Take 3 mLs (2.5 mg total) by nebulization every 6 (six) hours as needed for wheezing or shortness of breath. 360 mL 0   albuterol  (VENTOLIN  HFA) 108 (90 Base) MCG/ACT inhaler Inhale 2 puffs into the lungs every 4 (four) hours as needed for wheezing or shortness of breath. 54 g 0   dronedarone  (MULTAQ ) 400 MG tablet Take 1 tablet (400 mg total) by mouth 2 (two) times daily with a meal. 60 tablet    famotidine  (PEPCID ) 20 MG tablet Take 20 mg by mouth daily as needed for heartburn or indigestion.     levothyroxine  (SYNTHROID ) 125 MCG tablet Take 125 mcg by mouth daily before breakfast.     Multiple Vitamins-Minerals (MULTIVITAMIN WITH MINERALS) tablet Take 1 tablet by mouth daily.     nitroGLYCERIN  (NITROSTAT ) 0.4 MG SL tablet Place 0.4 mg under the tongue every 5 (five) minutes x 3 doses as needed for chest pain. (Patient not taking: Reported on 10/08/2022)     predniSONE  (DELTASONE ) 1 MG tablet Take 2 mg by mouth daily. (Patient not taking: Reported on 10/08/2022)     rosuvastatin  (CRESTOR ) 5 MG tablet Take 1 tablet (5 mg total) by mouth 3 (three) times a week. (Patient not taking: Reported on 08/04/2022) 36 tablet 3   SYMBICORT  160-4.5 MCG/ACT inhaler Inhale 2 puffs into the lungs every 12 (twelve) hours. 10.2 g 0   No current facility-administered medications for this visit.    Allergies  Allergen Reactions   Adenocard [Adenosine] Anaphylaxis   Contrast Media [Iodinated Contrast Media] Anaphylaxis   Influenza Virus Vaccine Anaphylaxis   Macrobid [Nitrofurantoin] Itching   Milk (Cow) Other (See Comments)    Stomach upset   Breztri  Aerosphere [Budeson-Glycopyrrol-Formoterol ]  Other (See Comments)    Urinary retention.   Codeine Sulfate Nausea And Vomiting   Latex Other (See Comments)    Skin irritation    Social History   Socioeconomic History   Marital status: Divorced    Spouse name: Not on file   Number of children: 2   Years of education: Not on file   Highest education level: Not on file  Occupational History   Occupation: rn    Comment: working w/ Therapist, Music as sports administrator  Tobacco Use   Smoking status: Never    Passive exposure: Past   Smokeless tobacco: Never  Vaping Use   Vaping status: Never Used  Substance and Sexual Activity   Alcohol use: Not Currently    Comment: rarely   Drug use: No   Sexual activity: Not Currently    Birth control/protection: Surgical    Comment: Hysterectomy  Other Topics Concern   Not on file  Social History Narrative   Not on  file   Social Drivers of Health   Tobacco Use: Low Risk (01/08/2023)   Patient History    Smoking Tobacco Use: Never    Smokeless Tobacco Use: Never    Passive Exposure: Past  Financial Resource Strain: Not on file  Food Insecurity: No Food Insecurity (08/04/2022)   Hunger Vital Sign    Worried About Running Out of Food in the Last Year: Never true    Ran Out of Food in the Last Year: Never true  Transportation Needs: No Transportation Needs (08/04/2022)   PRAPARE - Administrator, Civil Service (Medical): No    Lack of Transportation (Non-Medical): No  Physical Activity: Not on file  Stress: Not on file  Social Connections: Not on file  Intimate Partner Violence: Not At Risk (08/04/2022)   Humiliation, Afraid, Rape, and Kick questionnaire    Fear of Current or Ex-Partner: No    Emotionally Abused: No    Physically Abused: No    Sexually Abused: No  Depression (PHQ2-9): Not on file  Alcohol Screen: Not on file  Housing: Low Risk (08/04/2022)   Housing    Last Housing Risk Score: 0  Utilities: Not At Risk (08/04/2022)   AHC Utilities     Threatened with loss of utilities: No  Health Literacy: Not on file    Family History  Problem Relation Age of Onset   Asthma Mother    Heart disease Mother    Asthma Sister    Heart disease Sister    Breast cancer Sister 28   Heart disease Father    Kidney disease Father    Prostate cancer Brother        dx. 50s   Heart attack Brother 36   Alzheimer's disease Brother 18   Breast cancer Maternal Aunt 58   Stomach cancer Maternal Grandfather 58   Asthma Sister    Memory loss Sister    Heart attack Son 1   Spinal muscular atrophy Grandchild        type II   Cancer Other 32       niece dx. ca of salivary gland    Breast cancer Other 63       niece; negative genetic testing 10 years ago   Melanoma Other    Alzheimer's disease Maternal Aunt    Cervical cancer Cousin        maternal 1st cousin dx. at 23   Breast cancer Cousin 12       maternal 1st cousin   Lung cancer Cousin 29       maternal 1st cousin; smoker   Heart disease Cousin    Leukemia Cousin 14       paternal 1st cousin   Breast cancer Other        paternal great grandmother (PGF's mother) dx. late 72s-early 74s   Coronary artery disease Other     Review of Systems:  As stated in the HPI and otherwise negative.   There were no vitals taken for this visit.  Physical Examination: General: Well developed, well nourished, NAD  HEENT: OP clear, mucus membranes moist  SKIN: warm, dry. No rashes. Neuro: No focal deficits  Musculoskeletal: Muscle strength 5/5 all ext  Psychiatric: Mood and affect normal  Neck: No JVD, no carotid bruits, no thyromegaly, no lymphadenopathy.  Lungs:Clear bilaterally, no wheezes, rhonci, crackles Cardiovascular: Regular rate and rhythm. No murmurs, gallops or rubs. Abdomen:Soft. Bowel sounds present. Non-tender.  Extremities: No lower extremity edema. Pulses  are 2 + in the bilateral DP/PT.  EKG:  EKG is not *** ordered today. The ekg ordered today demonstrates   Recent  Labs: No results found for requested labs within last 365 days.   Lipid Panel    Component Value Date/Time   CHOL 236 (H) 03/11/2022 1135   CHOL 174 07/26/2017 1001   TRIG 46 03/11/2022 1135   HDL 90 03/11/2022 1135   HDL 76 07/26/2017 1001   CHOLHDL 2.6 03/11/2022 1135   VLDL 9 03/11/2022 1135   LDLCALC 137 (H) 03/11/2022 1135   LDLCALC 85 07/26/2017 1001     Wt Readings from Last 3 Encounters:  08/04/22 143 lb 4.8 oz (65 kg)  05/20/22 149 lb (67.6 kg)  05/01/22 149 lb (67.6 kg)    Assessment and Plan:   1. CAD with angina: No chest pain suggestive of angina. She is not on a beta blocker due to fatigue and hypotension.   -Continue ASA and statin    2. HLD: LDL ***. She has struggled with statins and Zetia .  -Continue statin and Zetia .  ??   3. Atrial tachycardia: Continue Multaq    Labs/ tests ordered today include:  No orders of the defined types were placed in this encounter.  Disposition:   F/U with me in 12 months  Signed, Lonni Cash, MD 03/24/2024 10:44 AM    [facility] [address] [place], [place], [place]  [address] Phone: [phone]; Fax: [phone]  "

## 2024-04-06 ENCOUNTER — Ambulatory Visit: Admitting: Cardiovascular Disease

## 2024-05-06 ENCOUNTER — Ambulatory Visit: Admitting: "Endocrinology

## 2024-06-02 ENCOUNTER — Ambulatory Visit: Admitting: Cardiovascular Disease

## 2024-07-07 ENCOUNTER — Ambulatory Visit: Admitting: Cardiovascular Disease
